# Patient Record
Sex: Female | Born: 1960 | Race: White | Hispanic: No | Marital: Single | Smoking: Current every day smoker
Health system: Southern US, Community
[De-identification: ages and names within clinical notes are randomized; demographics above are authoritative.]

## PROBLEM LIST (undated history)

## (undated) DIAGNOSIS — M549 Dorsalgia, unspecified: Secondary | ICD-10-CM

## (undated) DIAGNOSIS — I10 Essential (primary) hypertension: Secondary | ICD-10-CM

## (undated) DIAGNOSIS — F32A Depression, unspecified: Secondary | ICD-10-CM

## (undated) DIAGNOSIS — L739 Follicular disorder, unspecified: Secondary | ICD-10-CM

## (undated) DIAGNOSIS — C50919 Malignant neoplasm of unspecified site of unspecified female breast: Secondary | ICD-10-CM

## (undated) DIAGNOSIS — K2960 Other gastritis without bleeding: Secondary | ICD-10-CM

## (undated) DIAGNOSIS — Z9221 Personal history of antineoplastic chemotherapy: Secondary | ICD-10-CM

## (undated) DIAGNOSIS — Z923 Personal history of irradiation: Secondary | ICD-10-CM

## (undated) DIAGNOSIS — F419 Anxiety disorder, unspecified: Secondary | ICD-10-CM

## (undated) DIAGNOSIS — Z720 Tobacco use: Secondary | ICD-10-CM

## (undated) DIAGNOSIS — K219 Gastro-esophageal reflux disease without esophagitis: Secondary | ICD-10-CM

## (undated) DIAGNOSIS — D72829 Elevated white blood cell count, unspecified: Secondary | ICD-10-CM

## (undated) DIAGNOSIS — G5600 Carpal tunnel syndrome, unspecified upper limb: Secondary | ICD-10-CM

## (undated) DIAGNOSIS — IMO0002 Reserved for concepts with insufficient information to code with codable children: Secondary | ICD-10-CM

## (undated) DIAGNOSIS — G8929 Other chronic pain: Secondary | ICD-10-CM

## (undated) DIAGNOSIS — I639 Cerebral infarction, unspecified: Secondary | ICD-10-CM

## (undated) DIAGNOSIS — F329 Major depressive disorder, single episode, unspecified: Secondary | ICD-10-CM

## (undated) DIAGNOSIS — K2210 Ulcer of esophagus without bleeding: Secondary | ICD-10-CM

## (undated) DIAGNOSIS — Z972 Presence of dental prosthetic device (complete) (partial): Secondary | ICD-10-CM

## (undated) DIAGNOSIS — E785 Hyperlipidemia, unspecified: Secondary | ICD-10-CM

## (undated) DIAGNOSIS — B009 Herpesviral infection, unspecified: Secondary | ICD-10-CM

## (undated) HISTORY — DX: Depression, unspecified: F32.A

## (undated) HISTORY — DX: Anxiety disorder, unspecified: F41.9

## (undated) HISTORY — DX: Other chronic pain: G89.29

## (undated) HISTORY — DX: Major depressive disorder, single episode, unspecified: F32.9

## (undated) HISTORY — DX: Malignant neoplasm of unspecified site of unspecified female breast: C50.919

## (undated) HISTORY — DX: Other gastritis without bleeding: K29.60

## (undated) HISTORY — PX: UPPER GI ENDOSCOPY: SHX6162

## (undated) HISTORY — DX: Follicular disorder, unspecified: L73.9

## (undated) HISTORY — DX: Gastro-esophageal reflux disease without esophagitis: K21.9

## (undated) HISTORY — DX: Herpesviral infection, unspecified: B00.9

## (undated) HISTORY — DX: Ulcer of esophagus without bleeding: K22.10

## (undated) HISTORY — DX: Cerebral infarction, unspecified: I63.9

## (undated) HISTORY — DX: Tobacco use: Z72.0

## (undated) HISTORY — DX: Essential (primary) hypertension: I10

## (undated) HISTORY — DX: Elevated white blood cell count, unspecified: D72.829

## (undated) HISTORY — DX: Dorsalgia, unspecified: M54.9

## (undated) HISTORY — PX: TUBAL LIGATION: SHX77

## (undated) HISTORY — DX: Hyperlipidemia, unspecified: E78.5

## (undated) HISTORY — PX: OTHER SURGICAL HISTORY: SHX169

## (undated) HISTORY — DX: Carpal tunnel syndrome, unspecified upper limb: G56.00

## (undated) HISTORY — PX: COLONOSCOPY: SHX174

---

## 1998-09-21 ENCOUNTER — Other Ambulatory Visit: Admission: RE | Admit: 1998-09-21 | Discharge: 1998-09-21 | Payer: Self-pay | Admitting: Obstetrics and Gynecology

## 2000-01-30 ENCOUNTER — Other Ambulatory Visit: Admission: RE | Admit: 2000-01-30 | Discharge: 2000-01-30 | Payer: Self-pay | Admitting: Family Medicine

## 2000-02-01 ENCOUNTER — Encounter: Payer: Self-pay | Admitting: Family Medicine

## 2000-02-01 ENCOUNTER — Encounter: Admission: RE | Admit: 2000-02-01 | Discharge: 2000-02-01 | Payer: Self-pay | Admitting: Family Medicine

## 2000-06-26 DIAGNOSIS — G43909 Migraine, unspecified, not intractable, without status migrainosus: Secondary | ICD-10-CM | POA: Insufficient documentation

## 2000-06-26 DIAGNOSIS — G43809 Other migraine, not intractable, without status migrainosus: Secondary | ICD-10-CM | POA: Insufficient documentation

## 2002-10-06 ENCOUNTER — Encounter: Admission: RE | Admit: 2002-10-06 | Discharge: 2002-10-06 | Payer: Self-pay | Admitting: Family Medicine

## 2002-10-06 ENCOUNTER — Encounter: Payer: Self-pay | Admitting: Family Medicine

## 2002-10-25 ENCOUNTER — Encounter: Payer: Self-pay | Admitting: Family Medicine

## 2002-10-25 LAB — CONVERTED CEMR LAB: Pap Smear: NORMAL

## 2002-11-16 ENCOUNTER — Other Ambulatory Visit: Admission: RE | Admit: 2002-11-16 | Discharge: 2002-11-16 | Payer: Self-pay | Admitting: Family Medicine

## 2003-08-27 DIAGNOSIS — B009 Herpesviral infection, unspecified: Secondary | ICD-10-CM | POA: Insufficient documentation

## 2004-05-26 ENCOUNTER — Encounter: Payer: Self-pay | Admitting: Anesthesiology

## 2004-06-14 ENCOUNTER — Ambulatory Visit: Payer: Self-pay | Admitting: Anesthesiology

## 2004-07-16 ENCOUNTER — Ambulatory Visit: Payer: Self-pay | Admitting: Anesthesiology

## 2004-07-25 ENCOUNTER — Ambulatory Visit: Payer: Self-pay | Admitting: Family Medicine

## 2004-07-31 ENCOUNTER — Ambulatory Visit: Payer: Self-pay | Admitting: Professional

## 2004-08-07 ENCOUNTER — Ambulatory Visit: Payer: Self-pay | Admitting: Professional

## 2004-08-14 ENCOUNTER — Ambulatory Visit: Payer: Self-pay | Admitting: Professional

## 2004-08-15 ENCOUNTER — Ambulatory Visit: Payer: Self-pay | Admitting: Family Medicine

## 2004-08-28 ENCOUNTER — Ambulatory Visit: Payer: Self-pay | Admitting: Professional

## 2004-09-04 ENCOUNTER — Ambulatory Visit: Payer: Self-pay | Admitting: Professional

## 2004-09-06 ENCOUNTER — Ambulatory Visit: Payer: Self-pay | Admitting: Anesthesiology

## 2004-09-07 ENCOUNTER — Ambulatory Visit: Payer: Self-pay | Admitting: Family Medicine

## 2004-09-11 ENCOUNTER — Ambulatory Visit: Payer: Self-pay | Admitting: Family Medicine

## 2004-09-19 ENCOUNTER — Encounter: Payer: Self-pay | Admitting: Anesthesiology

## 2004-09-26 ENCOUNTER — Encounter: Payer: Self-pay | Admitting: Anesthesiology

## 2004-11-27 ENCOUNTER — Ambulatory Visit: Payer: Self-pay | Admitting: Family Medicine

## 2004-12-05 ENCOUNTER — Ambulatory Visit: Payer: Self-pay | Admitting: Anesthesiology

## 2004-12-12 ENCOUNTER — Ambulatory Visit: Payer: Self-pay | Admitting: Family Medicine

## 2004-12-17 ENCOUNTER — Ambulatory Visit: Payer: Self-pay | Admitting: Anesthesiology

## 2005-03-28 ENCOUNTER — Ambulatory Visit: Payer: Self-pay | Admitting: Family Medicine

## 2006-08-26 HISTORY — PX: OTHER SURGICAL HISTORY: SHX169

## 2006-09-16 ENCOUNTER — Ambulatory Visit: Payer: Self-pay | Admitting: Family Medicine

## 2006-09-16 LAB — CONVERTED CEMR LAB
ALT: 15 units/L (ref 0–40)
AST: 18 units/L (ref 0–37)
Albumin: 3.3 g/dL — ABNORMAL LOW (ref 3.5–5.2)
BUN: 9 mg/dL (ref 6–23)
Basophils Absolute: 0 10*3/uL (ref 0.0–0.1)
Basophils Relative: 0.4 % (ref 0.0–1.0)
CO2: 31 meq/L (ref 19–32)
Calcium: 10.2 mg/dL (ref 8.4–10.5)
Chloride: 102 meq/L (ref 96–112)
Cholesterol: 183 mg/dL (ref 0–200)
Creatinine, Ser: 1.2 mg/dL (ref 0.4–1.2)
Eosinophils Relative: 0.8 % (ref 0.0–5.0)
GFR calc Af Amer: 62 mL/min
GFR calc non Af Amer: 52 mL/min
Glucose, Bld: 87 mg/dL (ref 70–99)
HCT: 40.8 % (ref 36.0–46.0)
HDL: 45.7 mg/dL (ref >39.0)
Hemoglobin: 14.2 g/dL (ref 12.0–15.0)
LDL Cholesterol: 115 mg/dL — ABNORMAL HIGH (ref 0–99)
Lymphocytes Relative: 20 % (ref 12.0–46.0)
MCHC: 34.8 g/dL (ref 30.0–36.0)
MCV: 95.9 fL (ref 78.0–100.0)
Monocytes Absolute: 1.5 10*3/uL — ABNORMAL HIGH (ref 0.2–0.7)
Monocytes Relative: 12.1 % — ABNORMAL HIGH (ref 3.0–11.0)
Neutro Abs: 8.2 10*3/uL — ABNORMAL HIGH (ref 1.4–7.7)
Neutrophils Relative %: 66.7 % (ref 43.0–77.0)
Phosphorus: 4.9 mg/dL — ABNORMAL HIGH (ref 2.3–4.6)
Platelets: 273 10*3/uL (ref 150–400)
Potassium: 3.7 meq/L (ref 3.5–5.1)
RBC: 4.25 M/uL (ref 3.87–5.11)
RDW: 12.8 % (ref 11.5–14.6)
Sodium: 139 meq/L (ref 135–145)
TSH: 1.18 microintl units/mL (ref 0.35–5.50)
Total CHOL/HDL Ratio: 4
Triglycerides: 111 mg/dL (ref 0–149)
VLDL: 22 mg/dL (ref 0–40)
WBC: 12.2 10*3/uL — ABNORMAL HIGH (ref 4.5–10.5)

## 2006-09-30 ENCOUNTER — Ambulatory Visit: Payer: Self-pay | Admitting: Family Medicine

## 2006-10-20 ENCOUNTER — Ambulatory Visit: Payer: Self-pay | Admitting: Anesthesiology

## 2007-01-07 ENCOUNTER — Ambulatory Visit: Payer: Self-pay | Admitting: Anesthesiology

## 2007-02-17 ENCOUNTER — Ambulatory Visit: Payer: Self-pay | Admitting: Anesthesiology

## 2007-09-23 ENCOUNTER — Encounter: Payer: Self-pay | Admitting: Family Medicine

## 2007-09-23 DIAGNOSIS — H409 Unspecified glaucoma: Secondary | ICD-10-CM | POA: Insufficient documentation

## 2007-09-23 DIAGNOSIS — M549 Dorsalgia, unspecified: Secondary | ICD-10-CM

## 2007-09-23 DIAGNOSIS — M4800 Spinal stenosis, site unspecified: Secondary | ICD-10-CM | POA: Insufficient documentation

## 2007-09-23 DIAGNOSIS — G8929 Other chronic pain: Secondary | ICD-10-CM | POA: Insufficient documentation

## 2007-09-23 DIAGNOSIS — G5600 Carpal tunnel syndrome, unspecified upper limb: Secondary | ICD-10-CM | POA: Insufficient documentation

## 2007-11-17 ENCOUNTER — Ambulatory Visit: Payer: Self-pay | Admitting: Family Medicine

## 2007-11-17 DIAGNOSIS — F17200 Nicotine dependence, unspecified, uncomplicated: Secondary | ICD-10-CM | POA: Insufficient documentation

## 2007-11-17 DIAGNOSIS — F341 Dysthymic disorder: Secondary | ICD-10-CM | POA: Insufficient documentation

## 2007-11-17 DIAGNOSIS — E739 Lactose intolerance, unspecified: Secondary | ICD-10-CM | POA: Insufficient documentation

## 2007-12-19 ENCOUNTER — Emergency Department (HOSPITAL_COMMUNITY): Admission: EM | Admit: 2007-12-19 | Discharge: 2007-12-19 | Payer: Self-pay | Admitting: Emergency Medicine

## 2007-12-31 ENCOUNTER — Ambulatory Visit: Payer: Self-pay | Admitting: Family Medicine

## 2008-01-14 ENCOUNTER — Encounter: Payer: Self-pay | Admitting: Family Medicine

## 2008-03-30 ENCOUNTER — Ambulatory Visit: Payer: Self-pay | Admitting: Family Medicine

## 2008-03-30 ENCOUNTER — Encounter: Payer: Self-pay | Admitting: Family Medicine

## 2008-03-30 ENCOUNTER — Other Ambulatory Visit: Admission: RE | Admit: 2008-03-30 | Discharge: 2008-03-30 | Payer: Self-pay | Admitting: Family Medicine

## 2008-03-30 DIAGNOSIS — I10 Essential (primary) hypertension: Secondary | ICD-10-CM | POA: Insufficient documentation

## 2008-04-05 ENCOUNTER — Ambulatory Visit: Payer: Self-pay | Admitting: Family Medicine

## 2008-04-05 DIAGNOSIS — E7800 Pure hypercholesterolemia, unspecified: Secondary | ICD-10-CM | POA: Insufficient documentation

## 2008-04-06 LAB — CONVERTED CEMR LAB
AST: 13 units/L (ref 0–37)
Albumin: 3.4 g/dL — ABNORMAL LOW (ref 3.5–5.2)
Alkaline Phosphatase: 47 units/L (ref 39–117)
BUN: 13 mg/dL (ref 6–23)
Basophils Absolute: 0 10*3/uL (ref 0.0–0.1)
Chloride: 107 meq/L (ref 96–112)
Cholesterol: 180 mg/dL (ref 0–200)
Eosinophils Absolute: 0.2 10*3/uL (ref 0.0–0.7)
GFR calc Af Amer: 115 mL/min
GFR calc non Af Amer: 95 mL/min
HCT: 41.5 % (ref 36.0–46.0)
HDL: 40.1 mg/dL (ref >39.0)
MCHC: 34.4 g/dL (ref 30.0–36.0)
MCV: 98.7 fL (ref 78.0–100.0)
Monocytes Absolute: 1 10*3/uL (ref 0.1–1.0)
Neutrophils Relative %: 65.7 % (ref 43.0–77.0)
Platelets: 256 10*3/uL (ref 150–400)
Potassium: 3.8 meq/L (ref 3.5–5.1)
RDW: 12.9 % (ref 11.5–14.6)
TSH: 1.68 microintl units/mL (ref 0.35–5.50)
Total Bilirubin: 0.8 mg/dL (ref 0.3–1.2)
Triglycerides: 96 mg/dL (ref 0–149)
VLDL: 19 mg/dL (ref 0–40)
WBC: 11.7 10*3/uL — ABNORMAL HIGH (ref 4.5–10.5)

## 2008-05-23 ENCOUNTER — Ambulatory Visit: Payer: Self-pay | Admitting: Family Medicine

## 2008-06-28 ENCOUNTER — Telehealth: Payer: Self-pay | Admitting: Family Medicine

## 2008-06-28 DIAGNOSIS — N926 Irregular menstruation, unspecified: Secondary | ICD-10-CM | POA: Insufficient documentation

## 2009-10-31 ENCOUNTER — Ambulatory Visit: Payer: Self-pay | Admitting: Family Medicine

## 2009-12-01 ENCOUNTER — Ambulatory Visit: Payer: Self-pay | Admitting: Family Medicine

## 2009-12-01 DIAGNOSIS — N760 Acute vaginitis: Secondary | ICD-10-CM | POA: Insufficient documentation

## 2009-12-01 LAB — CONVERTED CEMR LAB: Whiff Test: POSITIVE

## 2009-12-04 LAB — CONVERTED CEMR LAB: Chlamydia, DNA Probe: NEGATIVE

## 2010-01-26 ENCOUNTER — Ambulatory Visit: Payer: Self-pay | Admitting: Family Medicine

## 2010-01-26 ENCOUNTER — Other Ambulatory Visit: Admission: RE | Admit: 2010-01-26 | Discharge: 2010-01-26 | Payer: Self-pay | Admitting: Family Medicine

## 2010-01-29 LAB — CONVERTED CEMR LAB
ALT: 16 units/L (ref 0–35)
AST: 14 units/L (ref 0–37)
Albumin: 3.8 g/dL (ref 3.5–5.2)
Alkaline Phosphatase: 50 units/L (ref 39–117)
BUN: 10 mg/dL (ref 6–23)
Basophils Absolute: 0.1 10*3/uL (ref 0.0–0.1)
Basophils Relative: 0.5 % (ref 0.0–3.0)
Bilirubin, Direct: 0.1 mg/dL (ref 0.0–0.3)
CO2: 30 meq/L (ref 19–32)
Calcium: 9 mg/dL (ref 8.4–10.5)
Chloride: 105 meq/L (ref 96–112)
Cholesterol: 203 mg/dL — ABNORMAL HIGH (ref 0–200)
Creatinine, Ser: 0.6 mg/dL (ref 0.4–1.2)
Direct LDL: 130.3 mg/dL
Eosinophils Absolute: 0.1 10*3/uL (ref 0.0–0.7)
Eosinophils Relative: 1 % (ref 0.0–5.0)
GFR calc non Af Amer: 106.61 mL/min (ref >60)
Glucose, Bld: 75 mg/dL (ref 70–99)
HCT: 40.4 % (ref 36.0–46.0)
HDL: 51.6 mg/dL (ref >39.00)
Hemoglobin: 14 g/dL (ref 12.0–15.0)
Lymphocytes Relative: 25.1 % (ref 12.0–46.0)
Lymphs Abs: 2.9 10*3/uL (ref 0.7–4.0)
MCHC: 34.6 g/dL (ref 30.0–36.0)
MCV: 96.2 fL (ref 78.0–100.0)
Monocytes Absolute: 1.1 10*3/uL — ABNORMAL HIGH (ref 0.1–1.0)
Monocytes Relative: 9.2 % (ref 3.0–12.0)
Neutro Abs: 7.4 10*3/uL (ref 1.4–7.7)
Neutrophils Relative %: 64.2 % (ref 43.0–77.0)
Platelets: 238 10*3/uL (ref 150.0–400.0)
Potassium: 4.2 meq/L (ref 3.5–5.1)
RBC: 4.2 M/uL (ref 3.87–5.11)
RDW: 13.4 % (ref 11.5–14.6)
Sodium: 142 meq/L (ref 135–145)
TSH: 1.23 microintl units/mL (ref 0.35–5.50)
Total Bilirubin: 0.5 mg/dL (ref 0.3–1.2)
Total CHOL/HDL Ratio: 4
Total Protein: 6.1 g/dL (ref 6.0–8.3)
Triglycerides: 97 mg/dL (ref 0.0–149.0)
VLDL: 19.4 mg/dL (ref 0.0–40.0)
WBC: 11.5 10*3/uL — ABNORMAL HIGH (ref 4.5–10.5)

## 2010-02-05 ENCOUNTER — Encounter: Payer: Self-pay | Admitting: Family Medicine

## 2010-02-23 ENCOUNTER — Encounter: Payer: Self-pay | Admitting: Family Medicine

## 2010-03-06 ENCOUNTER — Encounter: Payer: Self-pay | Admitting: Family Medicine

## 2010-03-21 ENCOUNTER — Encounter (INDEPENDENT_AMBULATORY_CARE_PROVIDER_SITE_OTHER): Payer: Self-pay | Admitting: *Deleted

## 2010-05-29 ENCOUNTER — Ambulatory Visit: Payer: Self-pay

## 2010-05-29 ENCOUNTER — Ambulatory Visit: Payer: Self-pay | Admitting: Family Medicine

## 2010-05-29 DIAGNOSIS — R609 Edema, unspecified: Secondary | ICD-10-CM | POA: Insufficient documentation

## 2010-05-29 DIAGNOSIS — M79609 Pain in unspecified limb: Secondary | ICD-10-CM | POA: Insufficient documentation

## 2010-06-04 ENCOUNTER — Encounter: Payer: Self-pay | Admitting: Family Medicine

## 2010-09-27 NOTE — Letter (Signed)
Summary: Results Follow up Letter  [place] at [facility]  [address] [place], [place] [address]   Phone: [phone]  Fax: [phone]    03/21/2010 MRN: [id number]  Amy Leonard [address] [place], [place]  [address]  Dear [name],  The following are the results of your recent test(s):  Test         Result    Pap Smear:        Normal _____  Not Normal _____ Comments: ______________________________________________________ Cholesterol: LDL(Bad cholesterol):         Your goal is less than:         HDL (Good cholesterol):       Your goal is more than: Comments:  ______________________________________________________ Mammogram:        Normal _x____  Not Normal _____ Comments:  Next mammogram due in one year  _________________________________________________________________ Hemoccult:        Normal _____  Not normal _______ Comments:    _____________________________________________________________________ Other Tests:    We routinely do not discuss normal results over the telephone.  If you desire a copy of the results, or you have any questions about this information we can discuss them at your next office visit.   Sincerely,

## 2010-09-27 NOTE — Letter (Signed)
Summary: [facility]-Consultation Confirmation  [facility]-Consultation Confirmation   Imported By: Maryln Gottron 06/13/2010 15:51:44  _____________________________________________________________________  External Attachment:    Type:   Image     Comment:   External Document

## 2010-09-27 NOTE — Assessment & Plan Note (Signed)
Summary: CHECK STD/CLE   Vital Signs:  Patient profile:   50 year old female Height:      64 inches Weight:      178.25 pounds BMI:     30.71 Temp:     98.2 degrees F oral Pulse rate:   92 / minute Pulse rhythm:   regular BP sitting:   148 / 88  (left arm) Cuff size:   large  Vitals Entered By: Lewanda Rife LPN (December 01, 6293 10:15 AM) CC: check for STD and rash on buttock   History of Present Illness: is back seeing her old boyfriend   he got checked for HIV and was neg  she generally wants to get checked for stds  thinks she may have yeast -- little burning and itching on the outside -- just started yesterday no discharge  no fever or other symptoms   with her period -- has odor for 2-3 d after -- that is nl for her  period is irregular  does have hot flashes also   needs to sched annual exam with pap when able   Allergies: 1)  ! Lipitor 2)  ! Wellbutrin 3)  ! * Aleve 4)  ! * Tussionex  Past History:  Past Medical History: Last updated: 05/23/2008 elevated wbc, nl diff chronic back pain-- spinal stenosis tabacco abuse depression/anx HSV- recurrent (side/buttock) migraine  ? glaucoma carpal tunnel  hyperlipidemia    ortho -- Dr Sherlean Foot  Past Surgical History: Last updated: 09/23/2007 BTL epidural steroid injection /08  Family History: Last updated: 03/30/2008 mother DM and HTN, leukemia in remission , obese GM strokes, DM sister depression  2 sisters and 1 brother with ETOH abuse , and drug abuse  son with bipolar/oppositional defiant   Social History: Last updated: 05/23/2008 Current Smoker 11/2 ppd  alcohol -- 4-5 beers fri and sat night, usually none during the week  drinking changes based on mood and anger level  no regular exercise  full time and part time job  is separated from husband   Risk Factors: Smoking Status: current (12/31/2007)  Review of Systems General:  Denies fatigue, fever, loss of appetite, and malaise. Eyes:   Denies discharge. CV:  Denies chest pain or discomfort and palpitations. Resp:  Denies cough. GI:  Denies diarrhea, nausea, and vomiting. GU:  Denies discharge, dysuria, hematuria, and urinary frequency. MS:  Denies joint pain. Derm:  Complains of itching; denies rash. Neuro:  Denies headaches. Psych:  mood is ok . Endo:  Denies excessive thirst and excessive urination.  Physical Exam  General:  Well-developed,well-nourished,in no acute distress; alert,appropriate and cooperative throughout examination Eyes:  vision grossly intact, pupils equal, pupils round, pupils reactive to light, and no injection.   Mouth:  pharynx pink and moist.   Neck:  No deformities, masses, or tenderness noted. Lungs:  diffusely distant bs CTA  Heart:  Normal rate and regular rhythm. S1 and S2 normal without gallop, murmur, click, rub or other extra sounds. Abdomen:  no suprapubic tenderness or fullness felt  Genitalia:  Normal introitus for age, no external lesions, no vaginal discharge, mucosa pink and moist, no vaginal or cervical lesions, no vaginal atrophy, no friaility or hemorrhage, normal uterus size and position, no adnexal masses or tenderness Msk:  no acute joint changes  Extremities:  No clubbing, cyanosis, edema, or deformity noted with normal full range of motion of all joints.   Neurologic:  gait normal and DTRs symmetrical and normal.  no tremor  Skin:  Intact without suspicious lesions or rashes Cervical Nodes:  No lymphadenopathy noted Inguinal Nodes:  No significant adenopathy Psych:  normal affect, talkative and pleasant    Impression & Recommendations:  Problem # 1:  SEXUALLY TRANSMITTED DISEASE, EXPOSURE TO (ICD-V01.6) Assessment Unchanged gon and chlam/ HIV and rpr done to day to update  disc safe sexual practices   Orders: T-Chlamydia Probe, genital [phone]) T-GC Probe, genital [phone]) Venipuncture ([address]) T-HIV Antibody  (Reflex) [phone]) T-RPR (Syphilis)  ([id number]) Specimen Handling ([address]) Wet Prep (42595GL) Prescription Created Electronically ([phone])  Problem # 2:  VAGINITIS, BACTERIAL (ICD-616.10) Assessment: New  with some irritative symptoms  tx with metrogel vaginal and update  Her updated medication list for this problem includes:    Metrogel-vaginal 0.75 % Gel (Metronidazole) .Marland Kitchen... 1 applicator intravaginally at bedtime for 7 days  Orders: Wet Prep ([id number]) Prescription Created Electronically [phone])  Complete Medication List: 1)  Tramadol Hcl 50 Mg Tabs (Tramadol hcl) .Marland Kitchen.. 1 tab by mouth every 6 hours as needed breaktrhu pain. 2)  Ibuprofen 800 Mg Tabs (Ibuprofen) .... One tab by mouth three times a day after meals. 3)  Flexeril 10 Mg Tabs (Cyclobenzaprine hcl) .... One tab by mouth three times a day as needed 4)  Zovirax 5 % Oint (Acyclovir) .... Apply to affected area 5 times per day as needed breakout 5)  Metrogel-vaginal 0.75 % Gel (Metronidazole) .Marland Kitchen.. 1 applicator intravaginally at bedtime for 7 days  Patient Instructions: 1)  please schedule PE with pap this summer  2)  use the metrogel vaginal gel as directed for bacterial vaginosis  3)  use the zovirax ointment for breakout on buttock when needed  4)  I will update you with labs when they return  5)  use condoms every time  Prescriptions: METROGEL-VAGINAL 0.75 % GEL (METRONIDAZOLE) 1 applicator intravaginally at bedtime for 7 days  #1 course x 0   Entered and Authorized by:   Judith Part MD   Signed by:   Judith Part MD on 12/01/2009   Method used:   Electronically to        [facility]* (retail)       [address]       [place], [place]  [address]       Ph: [phone]       Fax: [phone]   RxID:   [id number] ZOVIRAX 5 % OINT (ACYCLOVIR) apply to affected area 5 times per day as needed breakout  #1 medium x 3   Entered and Authorized by:   Judith Part MD   Signed by:   Judith Part MD on 12/01/2009   Method used:    Electronically to        [facility]* (retail)       [address]       [place], [place]  [address]       Ph: [phone]       Fax: [phone]   RxID:   [id number]   Current Allergies (reviewed today): ! LIPITOR ! WELLBUTRIN ! * ALEVE ! * TUSSIONEX    Laboratory Results    Wet [place]/KOH Source: vaginal  WBC/hpf 1-5 Bacteria/hpf 2+  Rods Clue cells/hpf moderate  Positive whiff Yeast/hpf none KOH Negative Trichomonas/hpf none

## 2010-09-27 NOTE — Letter (Signed)
Summary: Out of Work  [facility]  [address] [place], [place] [address]   Phone: [phone]  Fax: [phone]    October 31, 2009   Employee:  Amy Leonard    To Whom It May Concern:   For Medical reasons, please excuse the above named employee from work for the following dates:  Start:   10/31/09   End:   11/04/09 but please limit lifting to one gallon at a time for another week.  If you need additional information, please feel free to contact our office.         Sincerely,    Shaune Leeks MD

## 2010-09-27 NOTE — Assessment & Plan Note (Signed)
Summary: cpx w pap/dlo   Vital Signs:  Patient profile:   50 year old female Height:      63.5 inches Weight:      179.50 pounds BMI:     31.41 Temp:     97.8 degrees F oral Pulse rate:   80 / minute Pulse rhythm:   regular BP sitting:   130 / 74  (left arm) Cuff size:   large  Vitals Entered By: Lewanda Rife LPN (January 27, 8656 10:52 AM) CC: CPX with pap and brerast exam LMP 12/31/09   History of Present Illness: here for health mt exam and pap and breast exam   is working a lot with no help - does the work of 3 people  otherwise doing ok  wt is stable with bmi of 31  bp is 130/74   tab - no change in smoking  thinks she is ready to quit  would like to try chantix  has not tried nicotine replacement  thinks ins may pay  no breathing problems that are new   is hard to exercise with her back pain that is chronic    overdue lipid check  did eat today  diet is usually pretty fair -- did eat bisciut today , sandwhiches / frozen dinner - not the best  intol lipitor in past   hx of hsv recent std tests neg menses -- still monthly , last even longer now -up to 7-8 days to 11/2 weeks  ? perimenopausal  probs for last few years -- occ heavy bleeding or frequency - not for a while  pap 04- is due for that   is occ seeing same partner   mam 04-needs to set that up  self exam- no lumps or changes -- is fibrocystic   Td 04      Allergies: 1)  ! Lipitor 2)  ! Wellbutrin 3)  ! * Aleve 4)  ! * Tussionex  Past History:  Past Medical History: Last updated: 05/23/2008 elevated wbc, nl diff chronic back pain-- spinal stenosis tabacco abuse depression/anx HSV- recurrent (side/buttock) migraine  ? glaucoma carpal tunnel  hyperlipidemia    ortho -- Dr Sherlean Foot  Past Surgical History: Last updated: 09/23/2007 BTL epidural steroid injection /08  Family History: Last updated: 03/30/2008 mother DM and HTN, leukemia in remission , obese GM strokes, DM sister  depression  2 sisters and 1 brother with ETOH abuse , and drug abuse  son with bipolar/oppositional defiant   Social History: Last updated: 05/23/2008 Current Smoker 11/2 ppd  alcohol -- 4-5 beers fri and sat night, usually none during the week  drinking changes based on mood and anger level  no regular exercise  full time and part time job  is separated from husband   Risk Factors: Smoking Status: current (12/31/2007)  Review of Systems General:  Denies chills, fatigue, fever, loss of appetite, and malaise. Eyes:  Denies blurring and eye irritation. ENT:  Denies sinus pressure and sore throat. CV:  Denies chest pain or discomfort, palpitations, shortness of breath with exertion, and swelling of feet. Resp:  Denies cough, sputum productive, and wheezing. GI:  Denies abdominal pain, change in bowel habits, indigestion, nausea, and vomiting. GU:  Denies discharge, dysuria, and hematuria. MS:  Complains of low back pain; denies joint pain, joint redness, and joint swelling. Derm:  Denies itching, lesion(s), poor wound healing, and rash. Neuro:  Denies numbness and tingling. Psych:  mood is about the same .  Endo:  Denies cold intolerance, excessive thirst, excessive urination, and heat intolerance. Heme:  Denies abnormal bruising and bleeding.  Physical Exam  General:  overweight but generally well appearing  Head:  normocephalic, atraumatic, and no abnormalities observed.   Eyes:  vision grossly intact, pupils equal, pupils round, and pupils reactive to light.  no conjunctival pallor, injection or icterus  Ears:  R ear normal and L ear normal.   Nose:  no nasal discharge.   Mouth:  pharynx pink and moist.   Neck:  supple with full rom and no masses or thyromegally, no JVD or carotid bruit  Chest Wall:  No deformities, masses, or tenderness noted. Breasts:  No mass, nodules, thickening, tenderness, bulging, retraction, inflamation, nipple discharge or skin changes noted.     Lungs:  diffusely distant bs CTA  Heart:  Normal rate and regular rhythm. S1 and S2 normal without gallop, murmur, click, rub or other extra sounds. Abdomen:  Bowel sounds positive,abdomen soft and non-tender without masses, organomegaly or hernias noted. no renal bruits  Genitalia:  Normal introitus for age, no external lesions, no vaginal discharge, mucosa pink and moist, no vaginal or cervical lesions, no vaginal atrophy, no friaility or hemorrhage, normal uterus size and position, no adnexal masses or tenderness Msk:  No deformity or scoliosis noted of thoracic or lumbar spine.  poor rom LS  no acute joint changes Pulses:  R and L carotid,radial,femoral,dorsalis pedis and posterior tibial pulses are full and equal bilaterally Extremities:  No clubbing, cyanosis, edema, or deformity noted with normal full range of motion of all joints.   Neurologic:  gait normal and DTRs symmetrical and normal.  no tremor  Skin:  tanned with lentigos diffusely Cervical Nodes:  No lymphadenopathy noted Axillary Nodes:  No palpable lymphadenopathy Inguinal Nodes:  No significant adenopathy Psych:  normal affect, talkative and pleasant    Impression & Recommendations:  Problem # 1:  HEALTH MAINTENANCE EXAM (ICD-V70.0)  reviewed health habits including diet, exercise and skin cancer prevention reviewed health maintenance list and family history  labs today  Orders: Venipuncture ([address]) TLB-Lipid Panel (80061-LIPID) TLB-BMP (Basic Metabolic Panel-BMET) (80048-METABOL) TLB-CBC Platelet - w/Differential (85025-CBCD) TLB-Hepatic/Liver Function Pnl (80076-HEPATIC) TLB-TSH (Thyroid Stimulating Hormone) (84443-TSH)  Problem # 2:  PURE HYPERCHOLESTEROLEMIA (ICD-272.0)  check lipid today diet sub optimal non tol lipitor in past disc imp of chol control to dec CV risks as a smoker   Orders: Venipuncture ([address]) TLB-Lipid Panel (80061-LIPID) TLB-BMP (Basic Metabolic Panel-BMET)  (80048-METABOL) TLB-CBC Platelet - w/Differential (85025-CBCD) TLB-Hepatic/Liver Function Pnl (80076-HEPATIC) TLB-TSH (Thyroid Stimulating Hormone) (84443-TSH)  Problem # 3:  ROUTINE GYNECOLOGICAL EXAMINATION (ICD-V72.31) Assessment: Comment Only  annual exam with pap  recent neg std tests  perimenopausal period changes- is tolerating   Orders: Venipuncture ([address]) TLB-Lipid Panel (80061-LIPID) TLB-BMP (Basic Metabolic Panel-BMET) (80048-METABOL) TLB-CBC Platelet - w/Differential (85025-CBCD) TLB-Hepatic/Liver Function Pnl (80076-HEPATIC) TLB-TSH (Thyroid Stimulating Hormone) (84443-TSH)  Problem # 4:  TOBACCO USE (ICD-305.1) Assessment: Unchanged  pt is interested in trying to quit with chantix  px written if side eff like depression- stop and call disc vivid dreams/ nausea and other potential side eff given handout  Her updated medication list for this problem includes:    Chantix Starting Month Pak 0.5 Mg X 11 & 1 Mg X 42 Tabs (Varenicline tartrate) .Marland Kitchen... Take by mouth as directed    Chantix 1 Mg Tabs (Varenicline tartrate) .Marland Kitchen... 1 by mouth two times a day  to start after starter pack  Orders: Venipuncture ([address]) TLB-Lipid  Panel (80061-LIPID) TLB-BMP (Basic Metabolic Panel-BMET) (80048-METABOL) TLB-CBC Platelet - w/Differential (85025-CBCD) TLB-Hepatic/Liver Function Pnl (80076-HEPATIC) TLB-TSH (Thyroid Stimulating Hormone) (84443-TSH)  Complete Medication List: 1)  Tramadol Hcl 50 Mg Tabs (Tramadol hcl) .Marland Kitchen.. 1 tab by mouth every 6 hours as needed breaktrhu pain. 2)  Ibuprofen 800 Mg Tabs (Ibuprofen) .... One tab by mouth three times a day after meals as needed. 3)  Flexeril 10 Mg Tabs (Cyclobenzaprine hcl) .... One tab by mouth three times a day as needed 4)  Zovirax 5 % Oint (Acyclovir) .... Apply to affected area 5 times per day as needed breakout 5)  Tums 500 Mg Chew (Calcium carbonate antacid) .... Otc as directed. 6)  Chantix Starting Month Pak 0.5 Mg X 11 & 1  Mg X 42 Tabs (Varenicline tartrate) .... Take by mouth as directed 7)  Chantix 1 Mg Tabs (Varenicline tartrate) .Marland Kitchen.. 1 by mouth two times a day  to start after starter pack  Other Orders: Radiology Referral (Radiology)  Patient Instructions: 1)  you can raise your HDL (good cholesterol) by increasing exercise and eating omega 3 fatty acid supplement like fish oil or flax seed oil over the counter 2)  you can lower LDL (bad cholesterol) by limiting saturated fats in diet like red meat, fried foods, egg yolks, fatty breakfast meats, high fat dairy products and shellfish  3)  we will schedule mammogram at check out  4)  the current recommendation for calcium intake is 1200-1500 mg daily with 1000 IU of vitamin D  5)  try chantix to quit smoking  6)  labs today Prescriptions: TRAMADOL HCL 50 MG  TABS (TRAMADOL HCL) 1 tab by mouth every 6 hours as needed breaktrhu pain.  #90 x 1   Entered and Authorized by:   Judith Part MD   Signed by:   Judith Part MD on 01/26/2010   Method used:   Print then Give to Patient   RxID:   [id number] CHANTIX 1 MG TABS (VARENICLINE TARTRATE) 1 by mouth two times a day  to start after starter pack  #60 x 4   Entered and Authorized by:   Judith Part MD   Signed by:   Judith Part MD on 01/26/2010   Method used:   Print then Give to Patient   RxID:   [id number] CHANTIX STARTING MONTH PAK 0.5 MG X 11 & 1 MG X 42 TABS (VARENICLINE TARTRATE) take by mouth as directed  #1 pack x 0   Entered and Authorized by:   Judith Part MD   Signed by:   Judith Part MD on 01/26/2010   Method used:   Print then Give to Patient   RxID:   [id number]   Current Allergies (reviewed today): ! LIPITOR ! WELLBUTRIN ! * ALEVE ! * TUSSIONEX

## 2010-09-27 NOTE — Consult Note (Signed)
Summary: [facility]  [facility]   Imported By: Lanelle Bal 06/18/2010 15:36:00  _____________________________________________________________________  External Attachment:    Type:   Image     Comment:   External Document

## 2010-09-27 NOTE — Assessment & Plan Note (Signed)
Summary: leg is hurting/alc   Vital Signs:  Patient profile:   50 year old female Height:      63.5 inches Weight:      179 pounds BMI:     31.32 Temp:     98.2 degrees F oral Pulse rate:   80 / minute Pulse rhythm:   regular BP sitting:   146 / 84  (left arm) Cuff size:   large  Vitals Entered By: Lewanda Rife LPN (May 29, 2010 12:37 PM) CC: Rt leg is hurting on and off for two weeks. Pain level now is 4.   History of Present Illness: r leg has been swollen for about a week now thinks it may be from knee twisted knee about 2 weeks ago -- got bad --for several days -- after turning at work / no fall  then calf cramp for 4 days  back always bothers her too   no car trips or prolonged immobility   knee feels tender and bruised and medial calf is painful  some bruising there too  also varicose veins   Allergies: 1)  ! Lipitor 2)  ! Wellbutrin 3)  ! * Aleve 4)  ! * Tussionex  Past History:  Past Medical History: Last updated: 05/23/2008 elevated wbc, nl diff chronic back pain-- spinal stenosis tabacco abuse depression/anx HSV- recurrent (side/buttock) migraine  ? glaucoma carpal tunnel  hyperlipidemia    ortho -- Dr Sherlean Foot  Past Surgical History: Last updated: 09/23/2007 BTL epidural steroid injection /08  Family History: Last updated: 03/30/2008 mother DM and HTN, leukemia in remission , obese GM strokes, DM sister depression  2 sisters and 1 brother with ETOH abuse , and drug abuse  son with bipolar/oppositional defiant   Social History: Last updated: 05/23/2008 Current Smoker 11/2 ppd  alcohol -- 4-5 beers fri and sat night, usually none during the week  drinking changes based on mood and anger level  no regular exercise  full time and part time job  is separated from husband   Risk Factors: Smoking Status: current (12/31/2007)  Review of Systems General:  Denies fatigue, fever, loss of appetite, and malaise. CV:  Denies chest pain or  discomfort and palpitations. Resp:  Denies cough and wheezing. GI:  Denies abdominal pain. MS:  Complains of joint pain, joint swelling, muscle aches, and stiffness; denies joint redness, cramps, and muscle weakness. Derm:  Denies itching, lesion(s), poor wound healing, and rash. Neuro:  Denies numbness.  Physical Exam  General:  overweight but generally well appearing  Head:  normocephalic, atraumatic, and no abnormalities observed.   Eyes:  vision grossly intact, pupils equal, pupils round, and pupils reactive to light.   Mouth:  pharynx pink and moist.   Neck:  supple with full rom and no masses or thyromegally, no JVD or carotid bruit  Lungs:  diffusely distant bs CTA  Heart:  Normal rate and regular rhythm. S1 and S2 normal without gallop, murmur, click, rub or other extra sounds. Msk:  R knee is warm to touch  mild med and lat joint line tenderness poss small eff  some tenderness behind knee lat calf tenderness without palp cord  pos homann's sign for leg pain  mild ankle edema  without pitting  Pulses:  R and L carotid,radial,femoral,dorsalis pedis and posterior tibial pulses are full and equal bilaterally Extremities:  no palpale cords  few small compressible varicose veins  Neurologic:  sensation intact to light touch, gait normal, and DTRs symmetrical  and normal.   Skin:  Intact without suspicious lesions or rashes Cervical Nodes:  No lymphadenopathy noted Inguinal Nodes:  No significant adenopathy Psych:  normal affect, talkative and pleasant    Impression & Recommendations:  Problem # 1:  LEG PAIN, RIGHT (ICD-729.5) Assessment New suspect this is likely knee related but in light of overall leg swelling/ tenderness and pos homann's sign must also r/o dvt  will send for doppler (which would also show baker's cyst if present) then make plan  may need sport med or ortho for knee if all neg  Orders: Doppler Referral (Doppler)  Problem # 2:  EDEMA (ICD-782.3) see  above  Orders: Doppler Referral (Doppler)  Complete Medication List: 1)  Tramadol Hcl 50 Mg Tabs (Tramadol hcl) .Marland Kitchen.. 1 tab by mouth every 6 hours as needed breaktrhu pain. 2)  Ibuprofen 800 Mg Tabs (Ibuprofen) .... One tab by mouth three times a day after meals as needed. 3)  Flexeril 10 Mg Tabs (Cyclobenzaprine hcl) .... One tab by mouth three times a day as needed 4)  Zovirax 5 % Oint (Acyclovir) .... Apply to affected area 5 times per day as needed breakout 5)  Tums 500 Mg Chew (Calcium carbonate antacid) .... Otc as directed. 6)  Chantix Starting Month Pak 0.5 Mg X 11 & 1 Mg X 42 Tabs (Varenicline tartrate) .... Take by mouth as directed 7)  Chantix 1 Mg Tabs (Varenicline tartrate) .Marland Kitchen.. 1 by mouth two times a day  to start after starter pack  Patient Instructions: 1)  we will set up the leg doppler at check out  2)  keep ice on knee when you can and elevate it  3)  ibuprofen if helpful --if it does not bother your stomach  4)  I will make plan when I get results   Current Allergies (reviewed today): ! LIPITOR ! WELLBUTRIN ! * ALEVE ! * TUSSIONEX  Appended Document: leg is hurting/alc please let pt know I got call report that her doppler of leg is normal  no blood clots  I would like to refer her to ortho for knee pain/ injury if agreeable   Appended Document: leg is hurting/alc Patient notified as instructed by telephone. Pt said is agreeable to see orthopedic dr. Pt would like to go after Friday of this week; pt is going to ck with insurance co about cost of copay. Pt will wait to hear from pt care coordinator and can be reached at [phone].  Appended Document: leg is hurting/alc will do ref for [facility]   Clinical Lists Changes  Orders: Added new Referral order of Orthopedic Referral (Ortho) - Signed

## 2010-09-27 NOTE — Miscellaneous (Signed)
Summary: Orders Update  Clinical Lists Changes  Problems: Added new problem of EDEMA LEG (ICD-782.3) Orders: Added new Test order of Venous Duplex Lower Extremity (Venous Duplex Lower) - Signed 

## 2010-09-27 NOTE — Miscellaneous (Signed)
Summary: pap screening  Clinical Lists Changes  Observations: Added new observation of PAP SMEAR: normal (01/26/2010 9:47)      Preventive Care Screening  Pap Smear:    Date:  01/26/2010    Results:  normal

## 2010-09-27 NOTE — Assessment & Plan Note (Signed)
Summary: pain in left side, hurts to walk/ alc   Vital Signs:  Patient profile:   50 year old female Height:      64 inches Weight:      180 pounds BMI:     31.01 Temp:     98.2 degrees F oral Pulse rate:   76 / minute Pulse rhythm:   regular BP sitting:   158 / 90  (left arm) Cuff size:   regular  Vitals Entered By: Sydell Axon LPN (October 31, 1608 11:25 AM) CC: Pain in above the hip bone and it hurts to walk   History of Present Illness: Ms. Whidbee is a 50 y/o caucasian female who presents today with L sided flank pain superior to her iliac crest and painful walking.  She began noticing pain 3 days ago in the evening.  The pain is 5/10 and has kept her from sleeping well at night.  The patient has a history of back pain for which she takes ibuprofen (800mg  1/day) in the evenings.  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 1 1/2 By Mouth At Bedtime 2)  Tramadol Hcl 50 Mg  Tabs (Tramadol Hcl) .Marland Kitchen.. 1 Tab By Mouth Q 6 Hours As Needed Breaktrhu Pain.  Allergies: 1)  ! Lipitor 2)  ! Wellbutrin 3)  ! * Aleve 4)  ! *  Tussionex  Physical Exam  General:  Well-developed,well-nourished,in no acute distress; alert,appropriate and cooperative throughout examination Head:  Normocephalic and atraumatic without obvious abnormalities. No apparent alopecia or balding. Msk:  L sided tenderness to palpation just above iliac crest.  No pain with hip flexion, extension, abduction, or adduction or pain with flexing at the waist.   Impression & Recommendations:  Problem # 1:  LUMBAR SPRAIN AND STRAIN, LEFT (ICD-847.2) Assessment New See instructions.  Complete Medication List: 1)  Tramadol Hcl 50 Mg Tabs (Tramadol hcl) .Marland Kitchen.. 1 tab by mouth every 6 hours as needed breaktrhu pain. 2)  Ibuprofen 800 Mg Tabs (Ibuprofen) .... One tab by mouth three times a day after meals. 3)  Flexeril 10 Mg Tabs (Cyclobenzaprine hcl) .... One tab by mouth three times a day  Patient Instructions: 1)  Ibuprofen (800 mg) three times per day after meals. 2)  Flexeril 3 times per day until Saturday. On Saturday take a whole pill at night and half pills during the day if needed. 3)  Use cold compress today. After today use heat during the day and ice at night. 4)  Avoid sitting and laying down for long periods of time. 5)  Walking can help relax the back. Prescriptions: FLEXERIL  10 MG TABS (CYCLOBENZAPRINE HCL) one tab by mouth three times a day  #50 x 1   Entered and Authorized by:   Shaune Leeks MD   Signed by:   Shaune Leeks MD on 10/31/2009   Method used:   Electronically to        [facility]* (retail)       [address]       [place], [place]  [address]       Ph: [phone]       Fax: [phone]   RxID:   [id number] IBUPROFEN 800 MG TABS (IBUPROFEN) one tab by mouth three times a day after meals.  #90 x 1   Entered and Authorized by:   Shaune Leeks MD   Signed by:   Shaune Leeks MD on 10/31/2009   Method used:   Electronically to        [facility]* (retail)       [address]       [place], [place]  [address]       Ph: [phone]       Fax: [phone]   RxID:   [id number]   Current Allergies (reviewed today): ! LIPITOR ! WELLBUTRIN ! * ALEVE ! * TUSSIONEX  Appended Document: pain in left side, hurts to walk/ alc See Dr Milinda Antis son  ta reassess BP.

## 2010-09-27 NOTE — Letter (Signed)
Summary: Results Follow up Letter  [place] [facility]  [address] [place], [place] [address]   Phone: [phone]  Fax: [phone]    02/05/2010 MRN: [id number]    Amy Leonard [address] [place], [place]  [address]    Dear [name],  The following are the results of your recent test(s):  Test         Result    Pap Smear:        Normal __X__  Not Normal _____ Comments: ______________________________________________________ Cholesterol: LDL(Bad cholesterol):         Your goal is less than:         HDL (Good cholesterol):       Your goal is more than: Comments:  ______________________________________________________ Mammogram:        Normal _____  Not Normal _____ Comments:  ___________________________________________________________________ Hemoccult:        Normal _____  Not normal _______ Comments:    _____________________________________________________________________ Other Tests:    We routinely do not discuss normal results over the telephone.  If you desire a copy of the results, or you have any questions about this information we can discuss them at your next office visit.   Sincerely,    Idamae Schuller Aileene Lanum,MD  MT/ri

## 2010-09-28 NOTE — Miscellaneous (Signed)
Summary: Controlled Substance Agreement  Controlled Substance Agreement   Imported By: Lanelle Bal 02/01/2010 09:07:01  _____________________________________________________________________  External Attachment:    Type:   Image     Comment:   External Document

## 2011-05-04 ENCOUNTER — Encounter: Payer: Self-pay | Admitting: *Deleted

## 2011-05-04 ENCOUNTER — Ambulatory Visit (INDEPENDENT_AMBULATORY_CARE_PROVIDER_SITE_OTHER): Payer: BC Managed Care – PPO | Admitting: Family Medicine

## 2011-05-04 VITALS — BP 150/86 | HR 71 | Temp 98.0°F | Wt 176.0 lb

## 2011-05-04 DIAGNOSIS — L739 Follicular disorder, unspecified: Secondary | ICD-10-CM

## 2011-05-04 DIAGNOSIS — F17200 Nicotine dependence, unspecified, uncomplicated: Secondary | ICD-10-CM

## 2011-05-04 DIAGNOSIS — L738 Other specified follicular disorders: Secondary | ICD-10-CM

## 2011-05-04 DIAGNOSIS — L0291 Cutaneous abscess, unspecified: Secondary | ICD-10-CM

## 2011-05-04 DIAGNOSIS — L0390 Cellulitis, unspecified: Secondary | ICD-10-CM

## 2011-05-04 MED ORDER — SULFAMETHOXAZOLE-TRIMETHOPRIM 800-160 MG PO TABS: 1.0000 | ORAL_TABLET | Freq: Two times a day (BID) | ORAL | Status: AC

## 2011-05-04 NOTE — Patient Instructions (Signed)
Cellulitis Cellulitis is an infection of the skin and the tissue beneath it. The area is typically red and tender. It is caused by germs (bacteria) (usually staph or strep) that enter the body through cuts or sores. Cellulitis most commonly occurs in the arms or lower legs.  HOME CARE INSTRUCTIONS  If you are given a prescription for medications which kill germs (antibiotics), take as directed until finished.   If the infection is on the arm or leg, keep the limb elevated as able.   Use a warm cloth several times per day to relieve pain and encourage healing.   See your caregiver for recheck of the infected site in 103 or sooner if problems arise.   Only take over-the-counter or prescription medicines for pain, discomfort, or fever as directed by your caregiver.  SEEK MEDICAL CARE IF:  An oral temperature above 103 develops, not controlled by medication.   The area of redness (inflammation) is spreading, there are red streaks coming from the infected site, or if a part of the infection begins to turn dark in color.   The joint or bone underneath the infected skin becomes painful after the skin has healed.   The infection returns in the same or another area after it seems to have gone away.   A boil or bump swells up. This may be an abscess.   Cleanse daily with mild shampoo and warm water, may apply Witch Hazel Astringent if itchy or irritated  New, unexplained problems such as pain or fever develop.  SEEK IMMEDIATE MEDICAL CARE IF:  You or your child feels drowsy or lethargic.   There is vomiting, diarrhea, or lasting discomfort or feeling ill (malaise) with muscle aches and pains.  MAKE SURE YOU:   Understand these instructions.   Will watch your condition.   Will get help right away if you are not doing well or get worse.  Document Released: 05/22/2005 Document Re-Released: 06/09/2009 [facility] Patient Information 2011 [place], [place].

## 2011-05-05 ENCOUNTER — Encounter: Payer: Self-pay | Admitting: Family Medicine

## 2011-05-05 DIAGNOSIS — L739 Follicular disorder, unspecified: Secondary | ICD-10-CM

## 2011-05-05 HISTORY — DX: Follicular disorder, unspecified: L73.9

## 2011-05-05 NOTE — Assessment & Plan Note (Signed)
Left Occiput with initial erythematous lesion and some increased lymphadenopathy. Start Septra DS i tab po bid and she is asked to cleanse it daily with mild soap and peroxide, report if symptoms do not resolve

## 2011-05-05 NOTE — Assessment & Plan Note (Signed)
Unfortunately patient continues to smoke 1 ppd, she is educated for greater than 3 minutes about the need to quit to decrease her risk of many kinds of infections and illness

## 2011-05-05 NOTE — Progress Notes (Signed)
Amy Leonard [id number] 16-Sep-1960 05/05/2011      Progress Note-Follow Up  Subjective  Chief Complaint  Chief Complaint  Patient presents with  . Painful BUMP on back of head - no injury.  . Headache    HPI  Patient is a 50 year old Caucasian female who is in today for evaluation of a lesion on her left occiput. It's been present for several days and was initially pruritic. She notes she gets stiff and it became more swollen red and painful. She denies any fever or malaise. She does note some mild nasal congestion but denies any ear pain, sore throat, chest pain, palpitations, shortness of breath, GI or GU complaints at this time. She is not taking her Chantix and unfortunately continues to smoke a pack per day  Past Medical History  Diagnosis Date  . Elevated WBC count     nl diff  . Chronic back pain     spinal stenosis  . Tobacco abuse   . Anxiety and depression   . HSV infection     recurrent (side and buttox)  . Migraine   . Glaucoma     ?  . Carpal tunnel syndrome   . HLD (hyperlipidemia)   . Folliculitis 05/05/2011    Past Surgical History  Procedure Date  . Btl   . Epidural steroid injection 2008    Family History  Problem Relation Age of Onset  . Stroke      GM; DM    History   Social History  . Marital Status: Married    Spouse Name: N/A    Number of Children: N/A  . Years of Education: N/A   Occupational History  . Not on file.   Social History Main Topics  . Smoking status: Current Everyday Smoker -- 1.0 packs/day  . Smokeless tobacco: Not on file   Comment: 1 1/2 ppd   . Alcohol Use: Not on file     4-5 beers fri and sat night; usu none during the week; drinking changes based on mood and anger level   . Drug Use: Not on file  . Sexually Active: Not on file   Other Topics Concern  . Not on file   Social History Narrative   Separated; full time and part time job; no regular exercise.     Current Outpatient Prescriptions on File  Prior to Visit  Medication Sig Dispense Refill  . acyclovir (ZOVIRAX) 5 % ointment Apply topically 5 (five) times daily as needed.        . calcium carbonate (TUMS - DOSED IN MG ELEMENTAL CALCIUM) 500 MG chewable tablet Chew by mouth as directed.        . cyclobenzaprine (FLEXERIL) 10 MG tablet Take 10 mg by mouth 3 (three) times daily as needed.        Marland Kitchen ibuprofen (ADVIL,MOTRIN) 800 MG tablet Take 800 mg by mouth 3 (three) times daily after meals. PRN        . traMADol (ULTRAM) 50 MG tablet Take 50 mg by mouth every 6 (six) hours as needed.        . varenicline (CHANTIX STARTING MONTH PAK) 0.5 MG X 11 & 1 MG X 42 tablet Take by mouth 2 (two) times daily. Take one 0.5mg  tablet by mouth once daily for 3 days, then increase to one 0.5mg  tablet twice daily for 3 days, then increase to one 1mg  tablet twice daily.       . varenicline (CHANTIX) 1 MG  tablet Take 1 mg by mouth 2 (two) times daily.          Allergies  Allergen Reactions  . Atorvastatin     REACTION: constipation  . Bupropion Hcl     REACTION: GI, sleepy  . Naproxen Sodium     REACTION: does not tolerate  . Tussionex Pennkinetic Er     REACTION: vomiting    Review of System  Review of Systems  Constitutional: Negative for fever and malaise/fatigue.  HENT: Negative for congestion.   Eyes: Negative for discharge.  Respiratory: Negative for shortness of breath.   Cardiovascular: Negative for chest pain, palpitations and leg swelling.  Gastrointestinal: Negative for nausea, abdominal pain and diarrhea.  Genitourinary: Negative for dysuria.  Musculoskeletal: Negative for falls.  Skin: Negative for rash.       Painful, swollen, pruritic lesion on left occiput  Neurological: Negative for loss of consciousness and headaches.  Endo/Heme/Allergies: Negative for polydipsia.  Psychiatric/Behavioral: Negative for depression and suicidal ideas. The patient is not nervous/anxious and does not have insomnia.    Objective  BP 150/86   Pulse 71  Temp(Src) 98 F (36.7 C) (Oral)  Wt 176 lb (79.833 kg)  SpO2 96%  Physical Exam  Physical Exam  Constitutional: She is oriented to person, place, and time and well-developed, well-nourished, and in no distress. No distress.  HENT:  Head: Normocephalic and atraumatic.  Eyes: Conjunctivae are normal.  Neck: Neck supple. No thyromegaly present.  Cardiovascular: Normal rate, regular rhythm and normal heart sounds.   No murmur heard. Pulmonary/Chest: Effort normal and breath sounds normal. She has no wheezes.  Abdominal: She exhibits no distension and no mass.  Musculoskeletal: She exhibits no edema.  Lymphadenopathy:    She has no cervical adenopathy.  Neurological: She is alert and oriented to person, place, and time.  Skin: Skin is warm and dry. No rash noted. She is not diaphoretic.       Scabbed erythematous lesion over left occiput with some enlarged lymph node noted just below.  Psychiatric: Memory, affect and judgment normal.    Lab Results  Component Value Date   TSH 1.23 01/26/2010   Lab Results  Component Value Date   WBC 11.5* 01/26/2010   HGB 14.0 01/26/2010   HCT 40.4 01/26/2010   MCV 96.2 01/26/2010   PLT 238.0 01/26/2010   Lab Results  Component Value Date   CREATININE 0.6 01/26/2010   BUN 10 01/26/2010   NA 142 01/26/2010   K 4.2 01/26/2010   CL 105 01/26/2010   CO2 30 01/26/2010   Lab Results  Component Value Date   ALT 16 01/26/2010   AST 14 01/26/2010   ALKPHOS 50 01/26/2010   BILITOT 0.5 01/26/2010   Lab Results  Component Value Date   CHOL 203* 01/26/2010   Lab Results  Component Value Date   HDL 51.60 01/26/2010   Lab Results  Component Value Date   LDLCALC 121* 04/05/2008   Lab Results  Component Value Date   TRIG 97.0 01/26/2010   Lab Results  Component Value Date   CHOLHDL 4 01/26/2010     Assessment & Plan  Folliculitis Left Occiput with initial erythematous lesion and some increased lymphadenopathy. Start Septra DS i tab po bid and she is  asked to cleanse it daily with mild soap and peroxide, report if symptoms do not resolve  TOBACCO USE Unfortunately patient continues to smoke 1 ppd, she is educated for greater than 3 minutes about the  need to quit to decrease her risk of many kinds of infections and illness

## 2011-05-06 ENCOUNTER — Telehealth: Payer: Self-pay | Admitting: *Deleted

## 2011-05-06 NOTE — Telephone Encounter (Signed)
[facility] Triage Call Report Triage Record Num: [id number] Operator: Frederico Hamman Patient Name: Amy Leonard Call Date & Time: 05/03/2011 10:19:07PM Patient Phone: [phone] PCP: Audrie Gallus. [facility] Patient Gender: Female PCP Fax : Patient DOB: [birth year] Practice Name: [facility] Reason for Call: Ozella states she had onset of painful knot behind her ear onset when she turns her head. States she had onset of swelling behind ear that causes shooting pains up head. States she has not had period x 8 months.Had menses x 2 weeks. Had migraines on daily basis last week. Has been having intermittent vision problems. States area behind ear is red, enlarged and tender-may be draining sticky drainage. Per swollen lymph node protocol advised to be seen within 4 hr due to swelling and redness, tenderness. States she cannot afford $100 copay for ED. Will call [facility] office in AM to schedule appt. Care advice given. Protocol(s) Used: Swollen Lymph Nodes Recommended Outcome per Protocol: See Provider within 4 hours Reason for Outcome: Swollen glands AND any new symptoms of localized infection (redness, tenderness, warm to touch, increasing in size, etc.) Care Advice: ~ Do not try to pop a lump yourself by pressing on it or poking it with a needle. Call provider if you develop a temperature of 101.5 F (38.6 C) or greater, OR any temperature elevation if geriatric or immunocompromised (such as diabetes, HIV/AIDS, renal disease, chemotherapy, organ transplant, or chronic steroid use). ~ Apply warm, moist soaks or compresses to the affected area for 20-30 minutes 3 to 4 times per day. Avoid burning skin by using water no hotter than bath water and by not lying on the compresses. ~ ~ SYMPTOM / CONDITION MANAGEMENT ~ List, or take, all current prescription(s), nonprescription or alternative medication(s) to provider for evaluation. Analgesic/Antipyretic Advice - Acetaminophen: Consider  acetaminophen as directed on label or by pharmacist/provider for pain or fever PRECAUTIONS: - Use if there is no history of liver disease, alcoholism, or intake of three or more alcohol drinks per day - Only if approved by provider during pregnancy or when breastfeeding - During pregnancy, acetaminophen should not be taken more than 3 consecutive days without telling provider - Do not exceed recommended dose or frequency ~ Analgesic/Antipyretic Advice - NSAIDs: Consider aspirin, ibuprofen, naproxen or ketoprofen for pain or fever as directed on label or by pharmacist/provider. PRECAUTIONS: - If over 38 years of age, should not take longer than 1 week without consulting provider. EXCEPTIONS: - Should not be used if taking blood thinners or have bleeding problems. - Do not use if have history of sensitivity/allergy to any of these medications; or history of cardiovascular, ulcer, kidney, liver disease or diabetes unless approved by provider. - Do not exceed recommended dose or frequency. ~ 05/03/2011 10:47:44PM Page 1 of 1 [email or web address]_[email or web address]_V2

## 2011-05-15 NOTE — Telephone Encounter (Signed)
Yes- and pt did follow up the next day and was treated

## 2011-05-15 NOTE — Telephone Encounter (Signed)
Dr. Tower, have you seen this note? 

## 2011-09-30 ENCOUNTER — Encounter: Payer: Self-pay | Admitting: Family Medicine

## 2011-09-30 ENCOUNTER — Ambulatory Visit (INDEPENDENT_AMBULATORY_CARE_PROVIDER_SITE_OTHER): Payer: BC Managed Care – PPO | Admitting: Family Medicine

## 2011-09-30 ENCOUNTER — Telehealth: Payer: Self-pay | Admitting: Family Medicine

## 2011-09-30 VITALS — BP 162/98 | HR 80 | Temp 98.4°F | Wt 176.0 lb

## 2011-09-30 DIAGNOSIS — K602 Anal fissure, unspecified: Secondary | ICD-10-CM

## 2011-09-30 NOTE — Assessment & Plan Note (Signed)
Fissure and nonthrombosed hemorrhoid noted.  Likely related to recent illness and diarrhea.  F/u prn.  Supportive tx.  No indication for further w/u at this point, consider if bleeding persists. She agrees.

## 2011-09-30 NOTE — Telephone Encounter (Signed)
Triage Record Num: [id number] Operator: Caswell Corwin Patient Name: Amy Leonard Call Date & Time: 09/30/2011 10:19:35AM Patient Phone: [phone] PCP: Audrie Gallus. [facility] Patient Gender: Female PCP Fax : Patient DOB: 06-Sep-1960 Practice Name: [facility] Reason for Call: Caller: [name]/Patient; PCP: [name]; CB#: [phone]; Call regarding Blood in Stool 09/29/11 with diarrhea. Then saw it this AM with 2 bowell movements. Last one was at 0700 and was bright red and also having some nausea. Triaged GI Bldg and pt seeing frank red blood. Needs to be seen in the E/R per Nsg Judgement, but requesting an ofc visit due to cost. Called ofc and spoke with [name] and appt made for 1445 with Para March. Call back inst given. Protocol(s) Used: Gastrointestinal Bleeding Recommended Outcome per Protocol: See ED Immediately Reason for Outcome: Bloody diarrhea AND has not been evaluated Care Advice: ~ 09/30/2011 10:58:07AM Page 1 of 1 CAN_TriageRpt_V2

## 2011-09-30 NOTE — Telephone Encounter (Signed)
Patient is scheduled to see Dr. Para March today.

## 2011-09-30 NOTE — Patient Instructions (Signed)
Avoid prolonged sitting on the toilet, use tucks pads or similar along with prep H.  This should gradually improved.  If continued, then notify me or Tower.

## 2011-09-30 NOTE — Progress Notes (Signed)
Nausea w/o vomiting yesterday.  Diarrhea last night.  Rectal bleeding noted in this AM.  BRBPR.  No fevers. Some stomach cramping.  No more diarrhea or nausea since this AM.  No h/o known GI pathology per patient and no FH colon CA.  Occ ibuprofen use for back pain. No orthostatic sx.    Meds, vitals, and allergies reviewed.   ROS: See HPI.  Otherwise, noncontributory.  nad ncat rrr ctab Rectal exam- chaperoned exam.  Fissure and nonthrombosed hemorrhoid noted.  No gross blood.

## 2012-01-10 ENCOUNTER — Ambulatory Visit (INDEPENDENT_AMBULATORY_CARE_PROVIDER_SITE_OTHER): Payer: BC Managed Care – PPO | Admitting: Family Medicine

## 2012-01-10 ENCOUNTER — Encounter: Payer: Self-pay | Admitting: Family Medicine

## 2012-01-10 VITALS — BP 140/92 | HR 70 | Temp 97.9°F | Ht 64.0 in | Wt 177.0 lb

## 2012-01-10 DIAGNOSIS — J069 Acute upper respiratory infection, unspecified: Secondary | ICD-10-CM | POA: Insufficient documentation

## 2012-01-10 DIAGNOSIS — F17200 Nicotine dependence, unspecified, uncomplicated: Secondary | ICD-10-CM

## 2012-01-10 MED ORDER — GUAIFENESIN-CODEINE 100-10 MG/5ML PO SYRP: 5.0000 mL | ORAL_SOLUTION | Freq: Four times a day (QID) | ORAL | Status: AC | PRN

## 2012-01-10 NOTE — Progress Notes (Signed)
Subjective:    Patient ID: Amy Leonard, female    DOB: 05/21/1961, 51 y.o.   MRN: [id number]  HPI Last sat night - got sick with uri symptoms  Came home from work and one side of her nose burned and then sore throat worse on that side  Monday was running low grade fever- chills/sweats Congestion = nasal is bad Cough now - pretty bad - with mucous production -- was more prod last week  Not a lot of sinus pain Got dizzy last night  Ears are full feeling   Little wheeze - not bad Does not use inhaler  Still smokes -- about the same Not ready to quit yet  Does not think she has symptoms from smoking- like baseline sob or wheeze   Patient Active Problem List  Diagnoses  . HERPES SIMPLEX INFECTION  . LACTOSE INTOLERANCE  . PURE HYPERCHOLESTEROLEMIA  . ANXIETY DEPRESSION  . TOBACCO USE  . MIGRAINE VARIANT  . CARPAL TUNNEL SYNDROME  . UNSPECIFIED GLAUCOMA  . ESSENTIAL HYPERTENSION  . VAGINITIS, BACTERIAL  . IRREGULAR MENSES  . SPINAL STENOSIS  . BACK PAIN, CHRONIC  . LEG PAIN, RIGHT  . Edema  . Rectal fissure  . Viral URI with cough   Past Medical History  Diagnosis Date  . Elevated WBC count     nl diff  . Chronic back pain     spinal stenosis  . Tobacco abuse   . Anxiety and depression   . HSV infection     recurrent (side and buttox)  . Migraine   . Glaucoma     ?  . Carpal tunnel syndrome   . HLD (hyperlipidemia)   . Folliculitis 05/05/2011  . Anxiety   . Depression    Past Surgical History  Procedure Date  . Btl   . Epidural steroid injection 2008   History  Substance Use Topics  . Smoking status: Current Everyday Smoker -- 2.0 packs/day    Types: Cigarettes  . Smokeless tobacco: Not on file   Comment: 1 1/2 ppd   . Alcohol Use: Yes     4-5 beers fri and sat night; usu none during the week; drinking changes based on mood and anger level    Family History  Problem Relation Age of Onset  . Stroke      GM; DM  . Diabetes Mother   .  Hypertension Mother   . Leukemia Mother   . Obesity Mother   . Depression Sister   . Bipolar disorder Son     Bipolar / oppositional defiant  . Alcohol abuse Sister   . Drug abuse Sister   . Drug abuse Brother   . Alcohol abuse Brother   . Alcohol abuse Sister   . Drug abuse Sister   . Colon cancer Neg Hx    Allergies  Allergen Reactions  . Atorvastatin     REACTION: constipation  . Bupropion Hcl     REACTION: GI, sleepy  . Hydrocod Polst-Cpm Polst Er     REACTION: vomiting  . Naproxen Sodium     REACTION: does not tolerate   Current Outpatient Prescriptions on File Prior to Visit  Medication Sig Dispense Refill  . acyclovir (ZOVIRAX) 5 % ointment Apply topically 5 (five) times daily as needed.        . calcium carbonate (TUMS - DOSED IN MG ELEMENTAL CALCIUM) 500 MG chewable tablet Chew by mouth as directed.        Marland Kitchen  cyclobenzaprine (FLEXERIL) 10 MG tablet Take 10 mg by mouth 3 (three) times daily as needed.        Marland Kitchen ibuprofen (ADVIL,MOTRIN) 800 MG tablet Take 800 mg by mouth 3 (three) times daily after meals. PRN        . traMADol (ULTRAM) 50 MG tablet Take 50 mg by mouth every 6 (six) hours as needed.            Review of Systems Review of Systems  Constitutional: Negative pos for fever/ fatigue/ malaise Eyes: Negative for pain and visual disturbance.  ENT pos for congestion / ST/ neg for sinus pain  Respiratory: Negative for sob/ pos for cough/ mild wheeze Cardiovascular: Negative for cp or palpitations    Gastrointestinal: Negative for nausea, diarrhea and constipation.  Genitourinary: Negative for urgency and frequency.  Skin: Negative for pallor or rash   Neurological: Negative for weakness, light-headedness, numbness and headaches.  Hematological: Negative for adenopathy. Does not bruise/bleed easily.  Psychiatric/Behavioral: Negative for dysphoric mood. The patient is not nervous/anxious.         Objective:   Physical Exam  Constitutional: She appears  well-developed and well-nourished. No distress.  HENT:  Head: Normocephalic and atraumatic.  Right Ear: External ear normal.  Left Ear: External ear normal.  Mouth/Throat: Oropharynx is clear and moist. No oropharyngeal exudate.       Nares are injected and congested  No sinus tenderness  Eyes: Conjunctivae and EOM are normal. Pupils are equal, round, and reactive to light. Right eye exhibits no discharge. Left eye exhibits no discharge.  Neck: Normal range of motion. Neck supple. No JVD present. No thyromegaly present.  Cardiovascular: Normal rate, regular rhythm and normal heart sounds.   Pulmonary/Chest: Effort normal. No respiratory distress. She has wheezes. She has no rales. She exhibits no tenderness.       Diffusely distant bs Harsh bs in all areas Scant wheeze on forced exp only   Musculoskeletal: She exhibits no edema.  Lymphadenopathy:    She has no cervical adenopathy.  Neurological: She is alert. She has normal reflexes.  Skin: Skin is warm and dry. No rash noted. No erythema. No pallor.  Psychiatric: She has a normal mood and affect.          Assessment & Plan:

## 2012-01-10 NOTE — Patient Instructions (Signed)
You have a head and chest cold that need to run their course  Drink lots of water and get rest  Try the guifenesin/ codeine cough med with caution  For congestion - try zyrtec 10mg  once daily over the counter  Try nasal saline spray too Update if not starting to improve in a week or if worsening   Try to quit smoking

## 2012-01-10 NOTE — Assessment & Plan Note (Signed)
Disc in detail risks of smoking and possible outcomes including copd, vascular/ heart disease, cancer , respiratory and sinus infections  Pt voices understanding Pt not ready to quit yet 

## 2012-01-10 NOTE — Assessment & Plan Note (Signed)
Disc symptomatic care - see instructions on AVS  Will tx cough with robitussin ac with caution Fluids/ rest Update if not starting to improve in a week or if worsening  - esp if wheeze Adv to quit smoking

## 2012-04-15 ENCOUNTER — Encounter: Payer: Self-pay | Admitting: Family Medicine

## 2012-04-15 ENCOUNTER — Ambulatory Visit (INDEPENDENT_AMBULATORY_CARE_PROVIDER_SITE_OTHER): Payer: BC Managed Care – PPO | Admitting: Family Medicine

## 2012-04-15 VITALS — BP 130/82 | HR 86 | Temp 98.2°F | Ht 64.0 in | Wt 175.8 lb

## 2012-04-15 DIAGNOSIS — R2689 Other abnormalities of gait and mobility: Secondary | ICD-10-CM

## 2012-04-15 DIAGNOSIS — Z1231 Encounter for screening mammogram for malignant neoplasm of breast: Secondary | ICD-10-CM | POA: Insufficient documentation

## 2012-04-15 DIAGNOSIS — Z9181 History of falling: Secondary | ICD-10-CM

## 2012-04-15 DIAGNOSIS — R29818 Other symptoms and signs involving the nervous system: Secondary | ICD-10-CM

## 2012-04-15 DIAGNOSIS — R269 Unspecified abnormalities of gait and mobility: Secondary | ICD-10-CM

## 2012-04-15 NOTE — Progress Notes (Signed)
Subjective:    Patient ID: Amy Leonard, female    DOB: 1961-01-16, 51 y.o.   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 HERPES SIMPLEX INFECTION  . LACTOSE INTOLERANCE  . PURE HYPERCHOLESTEROLEMIA  . ANXIETY DEPRESSION  . TOBACCO USE  . MIGRAINE VARIANT  . CARPAL TUNNEL SYNDROME  . UNSPECIFIED GLAUCOMA  . ESSENTIAL HYPERTENSION  . VAGINITIS, BACTERIAL  . IRREGULAR MENSES  . SPINAL STENOSIS  . BACK PAIN, CHRONIC  . LEG PAIN, RIGHT  . Edema  . Rectal fissure  . Viral URI with cough   Past Medical History  Diagnosis Date  . Elevated WBC count     nl diff  . Chronic back pain     spinal stenosis  . Tobacco abuse   . Anxiety and depression   . HSV infection     recurrent (side and buttox)  . Migraine   . Glaucoma     ?  . Carpal tunnel syndrome   . HLD (hyperlipidemia)   . Folliculitis 05/05/2011  . Anxiety   . Depression    Past Surgical History  Procedure Date  . Btl   . Epidural steroid injection 2008   History  Substance Use Topics  . Smoking status: Current Everyday Smoker -- 2.0 packs/day    Types: Cigarettes  . Smokeless tobacco: Not on file   Comment: 1 1/2 ppd   . Alcohol Use: Yes     4-5 beers fri and sat night; usu none during the week;  drinking changes based on mood and anger level    Family History  Problem Relation Age of Onset  . Stroke      GM; DM  . Diabetes Mother   . Hypertension Mother   . Leukemia Mother   . Obesity Mother   . Depression Sister   . Bipolar disorder Son     Bipolar / oppositional defiant  . Alcohol abuse Sister   . Drug abuse Sister   . Drug abuse Brother   . Alcohol abuse Brother   . Alcohol abuse Sister   . Drug abuse Sister   . Colon cancer Neg Hx    Allergies  Allergen Reactions  . Atorvastatin     REACTION: constipation  . Bupropion Hcl     REACTION: GI, sleepy  . Hydrocod Polst-Cpm Polst Er     REACTION: vomiting  . Naproxen Sodium     REACTION: does not tolerate   Current Outpatient Prescriptions on File Prior to Visit  Medication Sig Dispense Refill  . ibuprofen (  ADVIL,MOTRIN) 800 MG tablet Take 800 mg by mouth 3 (three) times daily after meals. PRN        . acyclovir (ZOVIRAX) 5 % ointment Apply topically 5 (five) times daily as needed.        . calcium carbonate (TUMS - DOSED IN MG ELEMENTAL CALCIUM) 500 MG chewable tablet Chew by mouth as directed.        . cyclobenzaprine (FLEXERIL) 10 MG tablet Take 10 mg by mouth 3 (three) times daily as needed.        . traMADol (ULTRAM) 50 MG tablet Take 50 mg by mouth every 6 (six) hours as needed.              Review of Systems Review of Systems  Constitutional: Negative for fever, appetite change, fatigue and unexpected weight change.  Eyes: Negative for pain and visual disturbance.  Respiratory: Negative for cough and shortness of breath.   Cardiovascular: Negative for cp or palpitations    Gastrointestinal: Negative for nausea, diarrhea and constipation.  Genitourinary: Negative for urgency and frequency.  Skin: Negative for pallor or rash   Neurological: Negative for weakness, , numbness and pos for headaches and poor balance  Hematological: Negative for adenopathy. Does not bruise/bleed easily.    Psychiatric/Behavioral: Negative for dysphoric mood. The patient is not nervous/anxious.         Objective:   Physical Exam  Constitutional: She is oriented to person, place, and time. She appears well-developed and well-nourished. No distress.  HENT:  Head: Normocephalic and atraumatic.  Mouth/Throat: Oropharynx is clear and moist.  Eyes: Conjunctivae and EOM are normal. Pupils are equal, round, and reactive to light. No scleral icterus.  Neck: Normal range of motion. Neck supple. No JVD present. Carotid bruit is not present. No thyromegaly present.  Cardiovascular: Normal rate, regular rhythm, normal heart sounds and intact distal pulses.  Exam reveals no gallop.   Pulmonary/Chest: Effort normal and breath sounds normal. No respiratory distress. She has no wheezes.  Abdominal: Soft. Bowel sounds are normal. She exhibits no distension, no abdominal bruit and no mass. There is no tenderness.  Musculoskeletal: She exhibits no edema and no tenderness.  Lymphadenopathy:    She has no cervical adenopathy.  Neurological: She is alert and oriented to person, place, and time. She has normal strength and normal reflexes. She displays no atrophy and no tremor. No cranial nerve deficit or sensory deficit. She exhibits normal muscle tone. She displays a negative Romberg sign. Coordination and gait normal.       No focal cerebellar signs Nl gait today on exam Some generalized difficulty with tandem gait Nl toe and heel walking    Skin: Skin is warm and dry. No rash noted. No erythema. No pallor.  Psychiatric: She has a normal mood and affect. Her behavior is normal. Judgment and thought content normal.       Pleasant and talkative, but seems very stressed           Assessment & Plan:

## 2012-04-15 NOTE — Patient Instructions (Addendum)
Your exam is very reassuring today Think hard about quitting smoking  We will schedule CT of head at check out If symptoms worsen please let me know

## 2012-04-15 NOTE — Assessment & Plan Note (Signed)
Balance issues/ some headaches/ falls- in smoker - is worse during stress Check CT of head- if abn would do MRI reassurring exam today  Disc smoking cessation and stroke risks

## 2012-04-16 ENCOUNTER — Ambulatory Visit (INDEPENDENT_AMBULATORY_CARE_PROVIDER_SITE_OTHER)
Admission: RE | Admit: 2012-04-16 | Discharge: 2012-04-16 | Disposition: A | Discharge status: Home / Self Care | Payer: BC Managed Care – PPO | Source: Ambulatory Visit | Attending: Family Medicine | Admitting: Family Medicine

## 2012-04-16 ENCOUNTER — Telehealth: Payer: Self-pay

## 2012-04-16 DIAGNOSIS — I639 Cerebral infarction, unspecified: Secondary | ICD-10-CM

## 2012-04-16 DIAGNOSIS — Z9181 History of falling: Secondary | ICD-10-CM

## 2012-04-16 DIAGNOSIS — R2689 Other abnormalities of gait and mobility: Secondary | ICD-10-CM

## 2012-04-16 DIAGNOSIS — R269 Unspecified abnormalities of gait and mobility: Secondary | ICD-10-CM

## 2012-04-16 DIAGNOSIS — I10 Essential (primary) hypertension: Secondary | ICD-10-CM

## 2012-04-16 DIAGNOSIS — R29818 Other symptoms and signs involving the nervous system: Secondary | ICD-10-CM

## 2012-04-16 DIAGNOSIS — I6381 Other cerebral infarction due to occlusion or stenosis of small artery: Secondary | ICD-10-CM

## 2012-04-16 NOTE — Assessment & Plan Note (Signed)
See assesment for gait disorder- seems to be intermittent, perhaps fatigue or stress induced

## 2012-04-16 NOTE — Telephone Encounter (Signed)
Pt notified earlier needs MRI of brain; pt said she needs MRI of back also due to back pain and wanted to know if cost would be same to get full body MRI instead of just the head. I explained to pt the cost would not be the same but pt wanted note sent to Dr Milinda Antis for her opinion.Please advise.

## 2012-04-16 NOTE — Assessment & Plan Note (Signed)
mammo scheduled Will do breast exam at upcoming PE

## 2012-04-17 ENCOUNTER — Telehealth: Payer: Self-pay | Admitting: Family Medicine

## 2012-04-17 DIAGNOSIS — I6381 Other cerebral infarction due to occlusion or stenosis of small artery: Secondary | ICD-10-CM | POA: Insufficient documentation

## 2012-04-17 DIAGNOSIS — I639 Cerebral infarction, unspecified: Secondary | ICD-10-CM

## 2012-04-17 DIAGNOSIS — Z8673 Personal history of transient ischemic attack (TIA), and cerebral infarction without residual deficits: Secondary | ICD-10-CM | POA: Insufficient documentation

## 2012-04-17 HISTORY — DX: Other cerebral infarction due to occlusion or stenosis of small artery: I63.81

## 2012-04-17 HISTORY — DX: Cerebral infarction, unspecified: I63.9

## 2012-04-17 NOTE — Telephone Encounter (Signed)
Amy Leonard is going  to do the referral and I asked her to schedule appt to have labs done.

## 2012-04-17 NOTE — Telephone Encounter (Signed)
MRA without MRI with and without please

## 2012-04-17 NOTE — Telephone Encounter (Signed)
Cannot do both  I will go ahead and do the order for the MR of the brain  She also needs labs for BUN/ CR for contrast- please schedule lab draw before her MRI

## 2012-04-17 NOTE — Telephone Encounter (Signed)
Ok will do. Thanks Dr. Milinda Antis. Can you put them in as 2 separate orders they won't let me schedule them as one order. One for MRA and then another one for the MRI.

## 2012-04-17 NOTE — Telephone Encounter (Signed)
Dr. Milinda Antis I spoke with pt and Bjorn Loser concerning referral for the MRA and MRI. I also spoke with [facility] as well. The young lady says that we could do both a MRA of the Head without contrast and a MRI of the Brain either w and w/o or just w/o. Or we could just do one of the procedures. Wasn't which way you wanted to go ????

## 2012-04-20 NOTE — Telephone Encounter (Signed)
Orders done

## 2012-04-21 ENCOUNTER — Other Ambulatory Visit (INDEPENDENT_AMBULATORY_CARE_PROVIDER_SITE_OTHER): Payer: BC Managed Care – PPO

## 2012-04-21 DIAGNOSIS — I10 Essential (primary) hypertension: Secondary | ICD-10-CM

## 2012-04-21 LAB — BASIC METABOLIC PANEL
BUN: 13 mg/dL (ref 6–23)
CO2: 29 mEq/L (ref 19–32)
Calcium: 9.3 mg/dL (ref 8.4–10.5)
Creatinine, Ser: 0.8 mg/dL (ref 0.4–1.2)
GFR: 82.57 mL/min (ref >60.00)
Glucose, Bld: 90 mg/dL (ref 70–99)

## 2012-04-21 NOTE — Addendum Note (Signed)
Addended by: Baldomero Lamy on: 04/21/2012 03:05 PM   Modules accepted: Orders

## 2012-04-25 ENCOUNTER — Other Ambulatory Visit: Payer: BC Managed Care – PPO

## 2012-04-26 ENCOUNTER — Telehealth: Payer: Self-pay | Admitting: Family Medicine

## 2012-04-26 ENCOUNTER — Ambulatory Visit
Admission: RE | Admit: 2012-04-26 | Discharge: 2012-04-26 | Disposition: A | Discharge status: Home / Self Care | Payer: BC Managed Care – PPO | Source: Ambulatory Visit | Attending: Family Medicine | Admitting: Family Medicine

## 2012-04-26 DIAGNOSIS — I6381 Other cerebral infarction due to occlusion or stenosis of small artery: Secondary | ICD-10-CM

## 2012-04-26 DIAGNOSIS — I639 Cerebral infarction, unspecified: Secondary | ICD-10-CM

## 2012-04-26 MED ORDER — GADOBENATE DIMEGLUMINE 529 MG/ML IV SOLN
15.0000 mL | Freq: Once | INTRAVENOUS | Status: AC | PRN
  Administered 2012-04-26: 15 mL via INTRAVENOUS

## 2012-04-26 NOTE — Telephone Encounter (Signed)
Opened in error

## 2012-05-01 ENCOUNTER — Ambulatory Visit (INDEPENDENT_AMBULATORY_CARE_PROVIDER_SITE_OTHER): Payer: BC Managed Care – PPO | Admitting: Family Medicine

## 2012-05-01 ENCOUNTER — Encounter: Payer: Self-pay | Admitting: Family Medicine

## 2012-05-01 VITALS — BP 128/82 | HR 104 | Ht 64.0 in | Wt 175.8 lb

## 2012-05-01 DIAGNOSIS — A088 Other specified intestinal infections: Secondary | ICD-10-CM

## 2012-05-01 DIAGNOSIS — F17200 Nicotine dependence, unspecified, uncomplicated: Secondary | ICD-10-CM

## 2012-05-01 DIAGNOSIS — I6381 Other cerebral infarction due to occlusion or stenosis of small artery: Secondary | ICD-10-CM

## 2012-05-01 DIAGNOSIS — E7800 Pure hypercholesterolemia, unspecified: Secondary | ICD-10-CM

## 2012-05-01 DIAGNOSIS — I639 Cerebral infarction, unspecified: Secondary | ICD-10-CM

## 2012-05-01 DIAGNOSIS — A084 Viral intestinal infection, unspecified: Secondary | ICD-10-CM | POA: Insufficient documentation

## 2012-05-01 DIAGNOSIS — I6350 Cerebral infarction due to unspecified occlusion or stenosis of unspecified cerebral artery: Secondary | ICD-10-CM

## 2012-05-01 MED ORDER — TRAMADOL HCL 50 MG PO TABS: 50.0000 mg | ORAL_TABLET | Freq: Three times a day (TID) | ORAL | Status: DC | PRN

## 2012-05-01 NOTE — Progress Notes (Signed)
Subjective:    Patient ID: Amy Leonard, female    DOB: September 12, 1960, 51 y.o.   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   Also small vessel disease posterior   Has started cutting back on the cigarettes  Right now down from 1ppd to 1/3 ppd  Since her boyfriend does not smoke -- this is most helpful   Has started 81 mg asa daily - no problems with that   Lab Results  Component Value Date   CHOL 203* 01/26/2010   HDL 51.60 01/26/2010   LDLCALC 121* 04/05/2008   LDLDIRECT 130.3 01/26/2010   TRIG 97.0 01/26/2010   CHOLHDL 4 01/26/2010   intolerant of chol med in the past  Would be willing to try again, however Diet is not the best but can work on it  Goal for LDL is going to be under 100  Patient Active Problem List  Diagnosis  . HERPES SIMPLEX INFECTION  . LACTOSE INTOLERANCE  . PURE HYPERCHOLESTEROLEMIA  . ANXIETY DEPRESSION  . TOBACCO USE  . MIGRAINE VARIANT  . CARPAL TUNNEL SYNDROME  . UNSPECIFIED GLAUCOMA  . ESSENTIAL HYPERTENSION  . VAGINITIS, BACTERIAL  . IRREGULAR MENSES  . SPINAL STENOSIS  . BACK PAIN, CHRONIC  . LEG PAIN, RIGHT  . Edema  . Rectal fissure  . Viral URI with cough  . Loss of balance  . Gait disorder  . History of falling  . Other screening mammogram  . Lacunar infarction  . Basal ganglia infarction  . Viral gastroenteritis   Past Medical History  Diagnosis Date  . Elevated WBC count     nl diff  . Chronic back pain     spinal stenosis  . Tobacco abuse   . Anxiety and depression   . HSV infection     recurrent (side and buttox)  . Migraine   . Glaucoma     ?  . Carpal tunnel syndrome   . HLD (hyperlipidemia)   . Folliculitis  05/05/2011  . Anxiety   . Depression    Past Surgical History  Procedure Date  . Btl   . Epidural steroid injection 2008   History  Substance Use Topics  . Smoking status: Current Everyday Smoker -- 2.0 packs/day    Types: Cigarettes  . Smokeless tobacco: Not on file   Comment: 1 1/2 ppd   . Alcohol Use: Yes     4-5 beers fri and sat night; usu none during the week; drinking changes based on mood and anger level    Family History  Problem Relation Age of Onset  . Stroke      GM; DM  . Diabetes Mother   . Hypertension Mother   . Leukemia Mother   . Obesity Mother   . Depression Sister   . Bipolar disorder Son     Bipolar / oppositional defiant  . Alcohol abuse Sister   . Drug abuse Sister   . Drug abuse Brother   . Alcohol abuse Brother   . Alcohol abuse Sister   . Drug abuse Sister   . Colon cancer  Neg Hx    Allergies  Allergen Reactions  . Atorvastatin     REACTION: constipation  . Bupropion Hcl     REACTION: GI, sleepy  . Hydrocod Polst-Cpm Polst Er     REACTION: vomiting  . Naproxen Sodium     REACTION: does not tolerate   Current Outpatient Prescriptions on File Prior to Visit  Medication Sig Dispense Refill  . acyclovir (ZOVIRAX) 5 % ointment Apply topically 5 (five) times daily as needed.        . calcium carbonate (TUMS - DOSED IN MG ELEMENTAL CALCIUM) 500 MG chewable tablet Chew by mouth as directed.        . cyclobenzaprine (FLEXERIL) 10 MG tablet Take 10 mg by mouth 3 (three) times daily as needed.        Marland Kitchen ibuprofen (ADVIL,MOTRIN) 800 MG tablet Take 800 mg by mouth 3 (three) times daily after meals. PRN              Review of Systems     Objective:   Physical Exam  Constitutional: She is oriented to person, place, and time. She appears well-developed and well-nourished. No distress.       obese and well appearing   HENT:  Head: Normocephalic and atraumatic.  Right Ear: External ear normal.  Left Ear: External ear normal.  Nose: Nose  normal.  Mouth/Throat: Oropharynx is clear and moist.       No facial droop noted  Eyes: Conjunctivae and EOM are normal. Pupils are equal, round, and reactive to light. Right eye exhibits no discharge. Left eye exhibits no discharge. No scleral icterus.  Neck: Normal range of motion. Neck supple. No JVD present. Carotid bruit is not present. No thyromegaly present.  Cardiovascular: Normal rate, regular rhythm, normal heart sounds and intact distal pulses.  Exam reveals no gallop.   Pulmonary/Chest: Effort normal and breath sounds normal. No respiratory distress. She has no wheezes.       Diffusely distant bs   Abdominal: Soft. She exhibits no distension, no abdominal bruit and no mass. There is no tenderness. There is no rebound and no guarding.       Mildly overactive bs Not high pitched or tinkling  Musculoskeletal: She exhibits no edema and no tenderness.  Lymphadenopathy:    She has no cervical adenopathy.  Neurological: She is alert and oriented to person, place, and time. She has normal reflexes. She displays no atrophy and no tremor. No cranial nerve deficit or sensory deficit. She exhibits normal muscle tone. She displays a negative Romberg sign. She displays no seizure activity. Coordination and gait normal.       No focal cerebellar signs today- but she does have some trouble with tandem walk  Is also generally not feeling well, however  Skin: Skin is warm and dry. No rash noted. No erythema. No pallor.       Brisk capillary refil  Psychiatric: She has a normal mood and affect.          Assessment & Plan:

## 2012-05-01 NOTE — Patient Instructions (Addendum)
For now - sips of clear fluids until you are feeling better - Update if not starting to improve in a week or if worsening   When better-low cholesterol diet (Avoid red meat/ fried foods/ egg yolks/ fatty breakfast meats/ butter, cheese and high fat dairy/ and shellfish  ) Take 81 mg aspirin daily  No nsaids- tylenol is ok  We will schedule carotid doppler at check out Follow up with me 2 weeks after that  Call your insurance before next visit and find out what statin cholesterol medicines are covered for you  Stop smoking asap!!!!!

## 2012-05-03 NOTE — Assessment & Plan Note (Signed)
While chol is not extremely high -will likely need statin to get to goal  Will disc this at f/u Disc goals for lipids and reasons to control them Rev labs with pt Rev low sat fat diet in detail

## 2012-05-03 NOTE — Assessment & Plan Note (Signed)
Reviewed MRI and MRA Disc risk factors for cva in detail  Will work on quitting smoking/ continue asa daily and get carotid dopplers If neg consider echo Suspect small vessel dz however- disc this in detail  Stable exam today  Pt aware to call 911 for any cva symptoms

## 2012-05-03 NOTE — Assessment & Plan Note (Signed)
See assessment for basal ganglia infarction Causing balance issues- may consider PT  In future On asa  Will work on smoking cess/ bp control/ chol control / wt loss Carotid dopplers ordered and then f/u

## 2012-05-03 NOTE — Assessment & Plan Note (Signed)
Disc gradual fluid hydration - clear fluids with adv diet  (BRAT) as tolerated Reassuring exam  No abd tenderness or s/s of dehydration

## 2012-05-03 NOTE — Assessment & Plan Note (Signed)
With new hx of CVA in past  Is now motivated to quit on her own  Down to 1/3 ppd  Will hopefully quit entirely before f/u  Disc imp of this Disc in detail risks of smoking and possible outcomes including copd, vascular/ heart disease, cancer , respiratory and sinus infections  Pt voices understanding

## 2012-05-07 ENCOUNTER — Encounter (INDEPENDENT_AMBULATORY_CARE_PROVIDER_SITE_OTHER): Payer: BC Managed Care – PPO

## 2012-05-07 DIAGNOSIS — R269 Unspecified abnormalities of gait and mobility: Secondary | ICD-10-CM

## 2012-05-07 DIAGNOSIS — I6381 Other cerebral infarction due to occlusion or stenosis of small artery: Secondary | ICD-10-CM

## 2012-05-07 DIAGNOSIS — I6529 Occlusion and stenosis of unspecified carotid artery: Secondary | ICD-10-CM

## 2012-05-07 DIAGNOSIS — I639 Cerebral infarction, unspecified: Secondary | ICD-10-CM

## 2012-05-20 ENCOUNTER — Telehealth: Payer: Self-pay | Admitting: *Deleted

## 2012-05-20 ENCOUNTER — Ambulatory Visit (INDEPENDENT_AMBULATORY_CARE_PROVIDER_SITE_OTHER): Payer: BC Managed Care – PPO | Admitting: Family Medicine

## 2012-05-20 ENCOUNTER — Encounter: Payer: Self-pay | Admitting: Family Medicine

## 2012-05-20 VITALS — BP 130/88 | HR 82 | Temp 98.2°F | Ht 64.0 in | Wt 180.5 lb

## 2012-05-20 DIAGNOSIS — I6350 Cerebral infarction due to unspecified occlusion or stenosis of unspecified cerebral artery: Secondary | ICD-10-CM

## 2012-05-20 DIAGNOSIS — R519 Headache, unspecified: Secondary | ICD-10-CM | POA: Insufficient documentation

## 2012-05-20 DIAGNOSIS — I6381 Other cerebral infarction due to occlusion or stenosis of small artery: Secondary | ICD-10-CM

## 2012-05-20 DIAGNOSIS — E7800 Pure hypercholesterolemia, unspecified: Secondary | ICD-10-CM

## 2012-05-20 DIAGNOSIS — I10 Essential (primary) hypertension: Secondary | ICD-10-CM

## 2012-05-20 DIAGNOSIS — F17200 Nicotine dependence, unspecified, uncomplicated: Secondary | ICD-10-CM

## 2012-05-20 DIAGNOSIS — R51 Headache: Secondary | ICD-10-CM | POA: Insufficient documentation

## 2012-05-20 DIAGNOSIS — I639 Cerebral infarction, unspecified: Secondary | ICD-10-CM

## 2012-05-20 LAB — TSH: TSH: 1.05 u[IU]/mL (ref 0.35–5.50)

## 2012-05-20 LAB — LIPID PANEL
HDL: 52.5 mg/dL (ref >39.00)
Total CHOL/HDL Ratio: 4

## 2012-05-20 MED ORDER — VARENICLINE TARTRATE 0.5 MG PO TABS: 0.5000 mg | ORAL_TABLET | Freq: Two times a day (BID) | ORAL | Status: DC

## 2012-05-20 MED ORDER — VARENICLINE TARTRATE 0.5 MG X 11 & 1 MG X 42 PO MISC: ORAL | Status: DC

## 2012-05-20 NOTE — Patient Instructions (Addendum)
Continue your aspirin We will refer you to neurology at check out (headache and stroke) Try the chantix to quit smoking  Get your flu shot at work as soon as you can Labs today-we may have to start cholesterol med again Avoid red meat/ fried foods/ egg yolks/ fatty breakfast meats/ butter, cheese and high fat dairy/ and shellfish

## 2012-05-20 NOTE — Assessment & Plan Note (Signed)
On asa Checking chol- will likely put back on statin bp stable Quitting smoking Balance improved  Lab today- will tx chol  Due to ha will ref to neuro

## 2012-05-20 NOTE — Assessment & Plan Note (Signed)
New with balance issues and cva Ref to neuro

## 2012-05-20 NOTE — Assessment & Plan Note (Signed)
Fairly controlled with lifestyle - disc goals Better on re check today Will monitor carefully

## 2012-05-20 NOTE — Assessment & Plan Note (Signed)
Lab today Will likely re start statin Could not afford lipitor in past- but generic should be ok

## 2012-05-20 NOTE — Assessment & Plan Note (Signed)
In pt with CVA Given px for chantix- counseled on side eff incl depression  Disc in detail risks of smoking and possible outcomes including copd, vascular/ heart disease, cancer , respiratory and sinus infections  Pt voices understanding

## 2012-05-20 NOTE — Assessment & Plan Note (Signed)
Quitting smoking  On asa  No further symptoms except ha  Carotid dopplers neg  Ref to neuro

## 2012-05-20 NOTE — Telephone Encounter (Signed)
[facility] wants to know if you intended pt to have the 0.5mg  BID or the 1mg  BID of chantix after she completes the starter patch?

## 2012-05-20 NOTE — Progress Notes (Signed)
Subjective:    Patient ID: Amy Leonard, female    DOB: [birth year], 51 y.o.   MRN: [id number]  HPI Here for f/u of chronic conditions   Basal ganglia and lacunar infarcts-causing generally poor balance Carotid doppler - no significant stenosis Has not had echo yet   No stroke symptoms since last visit Balance is improving  Does still get a lot of headaches in general  Whole head - forehead  Getting once every 2-3 d and can last over a day  May be coming from her neck - it pops and cracks  Needs eye exam   Not a lot of stress  Is applying for a new job   Aspirin-- 81 mg daily - no problems with that   Smoking- 1/2 ppd- wants to start chantix   bp is up a bit today, was rushing  No cp or palpitations or headaches or edema  No side effects to medicines  BP Readings from Last 3 Encounters:  05/20/12 150/92  05/01/12 128/82  04/15/12 130/82     Lab Results  Component Value Date   CHOL 203* 01/26/2010   HDL 51.60 01/26/2010   LDLCALC 121* 04/05/2008   LDLDIRECT 130.3 01/26/2010   TRIG 97.0 01/26/2010   CHOLHDL 4 01/26/2010   was on lipitor in the past - ins no longer paid for it   Chemistry      Component Value Date/Time   NA 138 04/21/2012 1505   K 3.8 04/21/2012 1505   CL 102 04/21/2012 1505   CO2 29 04/21/2012 1505   BUN 13 04/21/2012 1505   CREATININE 0.8 04/21/2012 1505      Component Value Date/Time   CALCIUM 9.3 04/21/2012 1505   ALKPHOS 50 01/26/2010 1124   AST 14 01/26/2010 1124   ALT 16 01/26/2010 1124   BILITOT 0.5 01/26/2010 1124      Patient Active Problem List  Diagnosis  . HERPES SIMPLEX INFECTION  . LACTOSE INTOLERANCE  . PURE HYPERCHOLESTEROLEMIA  . ANXIETY DEPRESSION  . TOBACCO USE  . MIGRAINE VARIANT  . CARPAL TUNNEL SYNDROME  . UNSPECIFIED GLAUCOMA  . ESSENTIAL HYPERTENSION  . VAGINITIS, BACTERIAL  . IRREGULAR MENSES  . SPINAL STENOSIS  . BACK PAIN, CHRONIC  . LEG PAIN, RIGHT  . Edema  . Rectal fissure  . Viral URI with cough  . Loss of balance    . Gait disorder  . History of falling  . Other screening mammogram  . Lacunar infarction  . Basal ganglia infarction  . Viral gastroenteritis  . Headache   Past Medical History  Diagnosis Date  . Elevated WBC count     nl diff  . Chronic back pain     spinal stenosis  . Tobacco abuse   . Anxiety and depression   . HSV infection     recurrent (side and buttox)  . Migraine   . Glaucoma     ?  . Carpal tunnel syndrome   . HLD (hyperlipidemia)   . Folliculitis 05/05/2011  . Anxiety   . Depression   . 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 Btl   . Epidural steroid injection 2008   History  Substance Use Topics  . Smoking status: Current Every Day Smoker -- 0.5 packs/day    Types: Cigarettes  . Smokeless tobacco: Not on file   Comment: 1 1/2 ppd   . Alcohol Use: Yes     4-5 beers  fri and sat night; usu none during the week; drinking changes based on mood and anger level    Family History  Problem Relation Age of Onset  . Stroke      GM; DM  . Diabetes Mother   . Hypertension Mother   . Leukemia Mother   . Obesity Mother   . Depression Sister   . Bipolar disorder Son     Bipolar / oppositional defiant  . Alcohol abuse Sister   . Drug abuse Sister   . Drug abuse Brother   . Alcohol abuse Brother   . Alcohol abuse Sister   . Drug abuse Sister   . Colon cancer Neg Hx    Allergies  Allergen Reactions  . Atorvastatin     REACTION: constipation  . Bupropion Hcl     REACTION: GI, sleepy  . Hydrocod Polst-Cpm Polst Er     REACTION: vomiting  . Naproxen Sodium     REACTION: does not tolerate   Current Outpatient Prescriptions on File Prior to Visit  Medication Sig Dispense Refill  . acyclovir (ZOVIRAX) 5 % ointment Apply topically as needed.       . calcium carbonate (TUMS - DOSED IN MG ELEMENTAL CALCIUM) 500 MG chewable tablet Chew by mouth as directed.        . cyclobenzaprine (FLEXERIL) 10 MG tablet Take 10 mg by  mouth as needed.       . traMADol (ULTRAM) 50 MG tablet Take 1 tablet (50 mg total) by mouth every 8 (eight) hours as needed.  30 tablet  0     Review of Systems Review of Systems  Constitutional: Negative for fever, appetite change, and unexpected weight change. pos for intermittent chronic fatigue Eyes: Negative for pain and visual disturbance.  Respiratory: Negative for cough and shortness of breath.   Cardiovascular: Negative for cp or palpitations    Gastrointestinal: Negative for nausea, diarrhea and constipation.  Genitourinary: Negative for urgency and frequency.  Skin: Negative for pallor or rash   Neurological: Negative for weakness, light-headedness, numbness and pos for headaches , also poor balance (which has improved) Hematological: Negative for adenopathy. Does not bruise/bleed easily.  Psychiatric/Behavioral: Negative for dysphoric mood. The patient is not nervous/anxious.         Objective:   Physical Exam  Constitutional: She appears well-developed and well-nourished. No distress.       obese and well appearing   HENT:  Head: Normocephalic and atraumatic.  Right Ear: External ear normal.  Left Ear: External ear normal.  Nose: Nose normal.  Mouth/Throat: Oropharynx is clear and moist.       No facial or temporal tenderness  Eyes: Conjunctivae normal and EOM are normal. Pupils are equal, round, and reactive to light. No scleral icterus.       No nystagmus  Neck: Normal range of motion. Neck supple. No JVD present. Carotid bruit is not present. No thyromegaly present.  Cardiovascular: Normal rate, regular rhythm, normal heart sounds and intact distal pulses.  Exam reveals no gallop.   No murmur heard. Pulmonary/Chest: Effort normal and breath sounds normal. No respiratory distress. She has no wheezes.       Diffusely distant bs   Abdominal: Soft. Bowel sounds are normal. She exhibits no distension, no abdominal bruit and no mass. There is no tenderness.    Musculoskeletal: Normal range of motion. She exhibits no edema and no tenderness.  Lymphadenopathy:    She has no cervical adenopathy.  Neurological: She  is alert. She has normal reflexes. She displays no atrophy and no tremor. No cranial nerve deficit or sensory deficit. She exhibits normal muscle tone. She displays no seizure activity. Coordination and gait normal.       Today gait is improved- tandem with no problem and neg rhomberg test Improved   Skin: Skin is warm and dry. No rash noted. No erythema. No pallor.  Psychiatric: She has a normal mood and affect.          Assessment & Plan:

## 2012-05-21 NOTE — Telephone Encounter (Signed)
.  5 bid please - if I was unclear , please change the px - thanks

## 2012-05-21 NOTE — Telephone Encounter (Signed)
Notified [facility]. That pt needs to stay on the 0.5mg  BID after finishing the starter pak

## 2012-05-22 MED ORDER — ATORVASTATIN CALCIUM 10 MG PO TABS: 10.0000 mg | ORAL_TABLET | Freq: Every day | ORAL | Status: DC

## 2012-05-22 NOTE — Addendum Note (Signed)
Addended by: Patience Musca on: 05/22/2012 02:07 PM   Modules accepted: Orders

## 2012-06-30 ENCOUNTER — Telehealth: Payer: Self-pay | Admitting: Family Medicine

## 2012-06-30 DIAGNOSIS — E7800 Pure hypercholesterolemia, unspecified: Secondary | ICD-10-CM

## 2012-06-30 NOTE — Telephone Encounter (Signed)
Message copied by Judy Pimple on Tue Jun 30, 2012  5:42 PM ------      Message from: Baldomero Lamy      Created: Thu Jun 25, 2012  7:20 AM      Regarding: labs on  Wed 11/6 and 12/30??       Pt is scheduled for labs on 11/6 and also cpx labs on 12/30. Does she need to come for both? No orders in for either lab appt.                  Thanks      Rodney Booze

## 2012-06-30 NOTE — Telephone Encounter (Signed)
Yes -tomorrow is just for lipid labs I will put in orders thanks

## 2012-07-01 ENCOUNTER — Other Ambulatory Visit (INDEPENDENT_AMBULATORY_CARE_PROVIDER_SITE_OTHER): Payer: BC Managed Care – PPO

## 2012-07-01 DIAGNOSIS — E7800 Pure hypercholesterolemia, unspecified: Secondary | ICD-10-CM

## 2012-07-01 LAB — LIPID PANEL
HDL: 49.8 mg/dL (ref >39.00)
Total CHOL/HDL Ratio: 3

## 2012-07-01 LAB — AST: AST: 16 U/L (ref 0–37)

## 2012-07-01 LAB — ALT: ALT: 19 U/L (ref 0–35)

## 2012-07-03 ENCOUNTER — Encounter: Payer: Self-pay | Admitting: *Deleted

## 2012-08-23 ENCOUNTER — Telehealth: Payer: Self-pay | Admitting: Family Medicine

## 2012-08-23 DIAGNOSIS — Z0000 Encounter for general adult medical examination without abnormal findings: Secondary | ICD-10-CM | POA: Insufficient documentation

## 2012-08-23 DIAGNOSIS — E7800 Pure hypercholesterolemia, unspecified: Secondary | ICD-10-CM

## 2012-08-23 DIAGNOSIS — I10 Essential (primary) hypertension: Secondary | ICD-10-CM

## 2012-08-23 NOTE — Telephone Encounter (Signed)
Message copied by Judy Pimple on Sun Aug 23, 2012  3:43 PM ------      Message from: Baldomero Lamy      Created: Thu Aug 13, 2012  1:50 PM      Regarding: Cpx labs 12/30 Mon       Please order  future cpx labs for pt's upcoming lab appt.      Thanks      Rodney Booze

## 2012-08-24 ENCOUNTER — Other Ambulatory Visit (INDEPENDENT_AMBULATORY_CARE_PROVIDER_SITE_OTHER): Payer: BC Managed Care – PPO

## 2012-08-24 DIAGNOSIS — Z0000 Encounter for general adult medical examination without abnormal findings: Secondary | ICD-10-CM

## 2012-08-24 DIAGNOSIS — E7800 Pure hypercholesterolemia, unspecified: Secondary | ICD-10-CM

## 2012-08-24 LAB — COMPREHENSIVE METABOLIC PANEL
AST: 15 U/L (ref 0–37)
Albumin: 3.5 g/dL (ref 3.5–5.2)
BUN: 13 mg/dL (ref 6–23)
CO2: 29 mEq/L (ref 19–32)
Calcium: 8.9 mg/dL (ref 8.4–10.5)
Chloride: 104 mEq/L (ref 96–112)
Creatinine, Ser: 0.8 mg/dL (ref 0.4–1.2)
GFR: 81.26 mL/min (ref >60.00)
Potassium: 4.3 mEq/L (ref 3.5–5.1)

## 2012-08-24 LAB — LIPID PANEL
HDL: 45.5 mg/dL (ref >39.00)
Total CHOL/HDL Ratio: 4
Triglycerides: 129 mg/dL (ref 0.0–149.0)

## 2012-08-24 LAB — TSH: TSH: 0.59 u[IU]/mL (ref 0.35–5.50)

## 2012-08-25 LAB — CBC WITH DIFFERENTIAL/PLATELET
Basophils Relative: 0.2 % (ref 0.0–3.0)
Eosinophils Absolute: 0.1 10*3/uL (ref 0.0–0.7)
Eosinophils Relative: 1.3 % (ref 0.0–5.0)
Hemoglobin: 13.8 g/dL (ref 12.0–15.0)
Lymphocytes Relative: 30.2 % (ref 12.0–46.0)
Monocytes Relative: 8.4 % (ref 3.0–12.0)
Neutro Abs: 5.4 10*3/uL (ref 1.4–7.7)
Neutrophils Relative %: 59.9 % (ref 43.0–77.0)
RBC: 4.27 Mil/uL (ref 3.87–5.11)
WBC: 9 10*3/uL (ref 4.5–10.5)

## 2012-08-26 DIAGNOSIS — Z923 Personal history of irradiation: Secondary | ICD-10-CM

## 2012-08-26 DIAGNOSIS — C50919 Malignant neoplasm of unspecified site of unspecified female breast: Secondary | ICD-10-CM

## 2012-08-26 DIAGNOSIS — IMO0001 Reserved for inherently not codable concepts without codable children: Secondary | ICD-10-CM

## 2012-08-26 DIAGNOSIS — Z9221 Personal history of antineoplastic chemotherapy: Secondary | ICD-10-CM

## 2012-08-26 HISTORY — PX: BREAST BIOPSY: SHX20

## 2012-08-26 HISTORY — DX: Personal history of antineoplastic chemotherapy: Z92.21

## 2012-08-26 HISTORY — DX: Reserved for inherently not codable concepts without codable children: IMO0001

## 2012-08-26 HISTORY — DX: Personal history of irradiation: Z92.3

## 2012-08-26 HISTORY — DX: Malignant neoplasm of unspecified site of unspecified female breast: C50.919

## 2012-08-26 HISTORY — PX: BREAST LUMPECTOMY: SHX2

## 2012-09-01 ENCOUNTER — Ambulatory Visit (INDEPENDENT_AMBULATORY_CARE_PROVIDER_SITE_OTHER): Payer: BC Managed Care – PPO | Admitting: Family Medicine

## 2012-09-01 ENCOUNTER — Encounter: Payer: Self-pay | Admitting: Gastroenterology

## 2012-09-01 ENCOUNTER — Other Ambulatory Visit (HOSPITAL_COMMUNITY)
Admission: RE | Admit: 2012-09-01 | Discharge: 2012-09-01 | Disposition: A | Discharge status: Home / Self Care | Payer: BC Managed Care – PPO | Source: Ambulatory Visit | Attending: Family Medicine | Admitting: Family Medicine

## 2012-09-01 ENCOUNTER — Encounter: Payer: Self-pay | Admitting: Family Medicine

## 2012-09-01 VITALS — BP 154/98 | HR 90 | Temp 98.5°F | Ht 63.0 in | Wt 187.8 lb

## 2012-09-01 DIAGNOSIS — E7800 Pure hypercholesterolemia, unspecified: Secondary | ICD-10-CM

## 2012-09-01 DIAGNOSIS — Z01419 Encounter for gynecological examination (general) (routine) without abnormal findings: Secondary | ICD-10-CM | POA: Insufficient documentation

## 2012-09-01 DIAGNOSIS — Z23 Encounter for immunization: Secondary | ICD-10-CM

## 2012-09-01 DIAGNOSIS — Z0000 Encounter for general adult medical examination without abnormal findings: Secondary | ICD-10-CM

## 2012-09-01 DIAGNOSIS — R1310 Dysphagia, unspecified: Secondary | ICD-10-CM | POA: Insufficient documentation

## 2012-09-01 DIAGNOSIS — F17200 Nicotine dependence, unspecified, uncomplicated: Secondary | ICD-10-CM

## 2012-09-01 DIAGNOSIS — Z1211 Encounter for screening for malignant neoplasm of colon: Secondary | ICD-10-CM | POA: Insufficient documentation

## 2012-09-01 DIAGNOSIS — Z1231 Encounter for screening mammogram for malignant neoplasm of breast: Secondary | ICD-10-CM

## 2012-09-01 DIAGNOSIS — I10 Essential (primary) hypertension: Secondary | ICD-10-CM

## 2012-09-01 MED ORDER — LISINOPRIL 10 MG PO TABS: 10.0000 mg | ORAL_TABLET | Freq: Every day | ORAL | Status: DC

## 2012-09-01 NOTE — Assessment & Plan Note (Addendum)
Ref to GI for this - she is a smoker  Hx of gerd as well Disc diet

## 2012-09-01 NOTE — Patient Instructions (Addendum)
Tetanus shot and flu shot today We will schedule mammogram at check out  Pap smear today  Cholesterol is up a bit - Avoid red meat/ fried foods/ egg yolks/ fatty breakfast meats/ butter, cheese and high fat dairy/ and shellfish   We will do a referral to GI for the choking issue and also to discuss colon cancer screening  Start lisinopril 10 mg for high blood pressure in ams  Follow up in 4-6 weeks

## 2012-09-01 NOTE — Assessment & Plan Note (Addendum)
bp up today Not on medicine - likely needs to start Rev lifestyle change sched f/u for this and other issues- if not imp will disc tx

## 2012-09-01 NOTE — Progress Notes (Signed)
Subjective:    Patient ID: Amy Leonard, female    DOB: [birth year], 52 y.o.   MRN: [id number]  HPI Here for health maintenance exam and to review chronic medical problems    Is doing ok overall  Is having choking problems when she eats for 4-5 weeks , food gets stuck- some heartburn - will start zantac  Feet hurt and occ get numb , she has ingrown toenails    Mammogram-has been a while since last one -wants to go to the breast center Self exam-no lumps or changes   Pap 6/11- needs one  No abn paps  Has a new partner- is monogamous  Is having hot flashes , no peroid in over a year   Td 3/04-needs that  Flu- missed that , was out of town - will get one today  Pneumovax- will do that next time   Colon cancer screen- is interested in a colonoscopy   bp is up today  No cp or palpitations or headaches or edema   - and had a stressful am  No medicine BP Readings from Last 3 Encounters:  09/01/12 154/98  05/20/12 130/88  05/01/12 128/82     Hyperlipidemia On lipitor and diet  Lab Results  Component Value Date   CHOL 191 08/24/2012   CHOL 164 07/01/2012   CHOL 195 05/20/2012   Lab Results  Component Value Date   HDL 45.50 08/24/2012   HDL 49.80 07/01/2012   HDL 10.27 05/20/2012   Lab Results  Component Value Date   LDLCALC 120* 08/24/2012   LDLCALC 98 07/01/2012   LDLCALC 129* 05/20/2012   Lab Results  Component Value Date   TRIG 129.0 08/24/2012   TRIG 83.0 07/01/2012   TRIG 66.0 05/20/2012   Lab Results  Component Value Date   CHOLHDL 4 08/24/2012   CHOLHDL 3 07/01/2012   CHOLHDL 4 05/20/2012   Lab Results  Component Value Date   LDLDIRECT 130.3 01/26/2010     Lab Results  Component Value Date   WBC 9.0 08/24/2012   HGB 13.8 08/24/2012   HCT 40.7 08/24/2012   MCV 95.2 08/24/2012   PLT 233.0 08/24/2012     Wt is up 7 lb with bmi of 33   Smoking status - almost quit/ down to 2 cig per day- was with sisters who smoked when her mom died  Is ready to  quit again - has some chantix - will re start it , still has it    Patient Active Problem List  Diagnosis  . HERPES SIMPLEX INFECTION  . LACTOSE INTOLERANCE  . PURE HYPERCHOLESTEROLEMIA  . ANXIETY DEPRESSION  . TOBACCO USE  . MIGRAINE VARIANT  . CARPAL TUNNEL SYNDROME  . UNSPECIFIED GLAUCOMA  . ESSENTIAL HYPERTENSION  . VAGINITIS, BACTERIAL  . IRREGULAR MENSES  . SPINAL STENOSIS  . BACK PAIN, CHRONIC  . LEG PAIN, RIGHT  . Edema  . Rectal fissure  . Viral URI with cough  . Loss of balance  . Gait disorder  . History of falling  . Other screening mammogram  . Lacunar infarction  . Basal ganglia infarction  . Viral gastroenteritis  . Headache  . Routine general medical examination at a health care facility   Past Medical History  Diagnosis Date  . Elevated WBC count     nl diff  . Chronic back pain     spinal stenosis  . Tobacco abuse   . Anxiety and depression   . HSV  infection     recurrent (side and buttox)  . Migraine   . Glaucoma(365)     ?  Marland Kitchen Carpal tunnel syndrome   . HLD (hyperlipidemia)   . Folliculitis 05/05/2011  . Anxiety   . Depression   . 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 Btl   . Epidural steroid injection 2008   History  Substance Use Topics  . Smoking status: Current Every Day Smoker -- 0.5 packs/day    Types: Cigarettes  . Smokeless tobacco: Not on file     Comment: 1 1/2 ppd   . Alcohol Use: Yes     Comment: 4-5 beers fri and sat night; usu none during the week; drinking changes based on mood and anger level    Family History  Problem Relation Age of Onset  . Stroke      GM; DM  . Diabetes Mother   . Hypertension Mother   . Leukemia Mother   . Obesity Mother   . Depression Sister   . Bipolar disorder Son     Bipolar / oppositional defiant  . Alcohol abuse Sister   . Drug abuse Sister   . Drug abuse Brother   . Alcohol abuse Brother   . Alcohol abuse Sister   . Drug abuse Sister     . Colon cancer Neg Hx    Allergies  Allergen Reactions  . Atorvastatin     REACTION: constipation  . Bupropion Hcl     REACTION: GI, sleepy  . Hydrocod Polst-Cpm Polst Er     REACTION: vomiting  . Naproxen Sodium     REACTION: does not tolerate   Current Outpatient Prescriptions on File Prior to Visit  Medication Sig Dispense Refill  . acetaminophen (TYLENOL) 325 MG tablet Take 650 mg by mouth as needed.      Marland Kitchen acyclovir (ZOVIRAX) 5 % ointment Apply topically as needed.       Marland Kitchen aspirin 81 MG chewable tablet Chew 81 mg by mouth daily.      Marland Kitchen atorvastatin (LIPITOR) 10 MG tablet Take 1 tablet (10 mg total) by mouth daily.  30 tablet  5  . calcium carbonate (TUMS - DOSED IN MG ELEMENTAL CALCIUM) 500 MG chewable tablet Chew by mouth as directed.        . cyclobenzaprine (FLEXERIL) 10 MG tablet Take 10 mg by mouth as needed.       . traMADol (ULTRAM) 50 MG tablet Take 1 tablet (50 mg total) by mouth every 8 (eight) hours as needed.  30 tablet  0  . varenicline (CHANTIX STARTING MONTH PAK) 0.5 MG X 11 & 1 MG X 42 tablet Take one 0.5 mg tablet by mouth once daily for 3 days, then increase to one 0.5 mg tablet twice daily for 4 days, then increase to one 1 mg tablet twice daily.  53 tablet  0  . varenicline (CHANTIX) 0.5 MG tablet Take 1 tablet (0.5 mg total) by mouth 2 (two) times daily.  60 tablet  3     Review of Systems Review of Systems  Constitutional: Negative for fever, appetite change, fatigue and unexpected weight change.  Eyes: Negative for pain and visual disturbance.  Respiratory: Negative for cough and shortness of breath.   Cardiovascular: Negative for cp or palpitations    Gastrointestinal: Negative for nausea, diarrhea and constipation pos for dysphagia and acid reflux .  Genitourinary: Negative for urgency and frequency.  Skin:  Negative for pallor or rash   MSK pos for chronic back pain  Neurological: Negative for weakness, light-headedness, numbness and headaches.   Hematological: Negative for adenopathy. Does not bruise/bleed easily.  Psychiatric/Behavioral: Negative for dysphoric mood. The patient is not nervous/anxious.         Objective:   Physical Exam  Constitutional: She appears well-developed and well-nourished. No distress.       obese and well appearing   HENT:  Head: Normocephalic and atraumatic.  Right Ear: External ear normal.  Left Ear: External ear normal.  Nose: Nose normal.  Mouth/Throat: Oropharynx is clear and moist.  Eyes: Conjunctivae normal and EOM are normal. Pupils are equal, round, and reactive to light. Right eye exhibits no discharge. Left eye exhibits no discharge.  Neck: Normal range of motion. Neck supple. No JVD present. Carotid bruit is not present. No thyromegaly present.  Cardiovascular: Normal rate, regular rhythm, normal heart sounds and intact distal pulses.  Exam reveals no gallop.   Pulmonary/Chest: Effort normal and breath sounds normal. No respiratory distress. She has no wheezes.  Abdominal: Soft. Bowel sounds are normal. She exhibits no distension, no abdominal bruit and no mass. There is no tenderness.  Genitourinary: Vagina normal and uterus normal. No breast swelling, tenderness, discharge or bleeding. There is no rash or tenderness on the right labia. There is no rash or tenderness on the left labia. Uterus is not enlarged and not tender. Cervix exhibits no motion tenderness, no discharge and no friability. Right adnexum displays no mass, no tenderness and no fullness. Left adnexum displays no mass, no tenderness and no fullness. No bleeding around the vagina. No vaginal discharge found.       Breast exam: No mass, nodules, thickening, tenderness, bulging, retraction, inflamation, nipple discharge or skin changes noted.  No axillary or clavicular LA.  Chaperoned exam.    Musculoskeletal: She exhibits no edema and no tenderness.  Lymphadenopathy:    She has no cervical adenopathy.  Neurological: She is  alert. She has normal reflexes. No cranial nerve deficit. She exhibits normal muscle tone. Coordination normal.  Skin: Skin is warm and dry. No rash noted. No erythema. No pallor.  Psychiatric: She has a normal mood and affect.          Assessment & Plan:

## 2012-09-03 NOTE — Assessment & Plan Note (Signed)
Annual exam with pap

## 2012-09-03 NOTE — Assessment & Plan Note (Signed)
Disc in detail risks of smoking and possible outcomes including copd, vascular/ heart disease, cancer , respiratory and sinus infections  Pt voices understanding  

## 2012-09-03 NOTE — Assessment & Plan Note (Signed)
Scheduled annual screening mammogram Nl breast exam today  Encouraged monthly self exams   

## 2012-09-03 NOTE — Assessment & Plan Note (Signed)
Ref to GI to disc this and her dysphagia

## 2012-09-03 NOTE — Assessment & Plan Note (Signed)
Reviewed health habits including diet and exercise and skin cancer prevention Also reviewed health mt list, fam hx and immunizations  Disc need for wt loss and smoking cessation Rev wellness labs

## 2012-09-04 ENCOUNTER — Encounter: Payer: Self-pay | Admitting: *Deleted

## 2012-09-14 ENCOUNTER — Ambulatory Visit: Payer: BC Managed Care – PPO | Admitting: Gastroenterology

## 2012-09-17 ENCOUNTER — Telehealth: Payer: Self-pay

## 2012-09-17 NOTE — Telephone Encounter (Signed)
I do not think it is from the tetanus shot - unsure what it could be unless evaluated (tendonitis is common, though) I recommend she makes an appt with Dr Patsy Lager for eval

## 2012-09-17 NOTE — Telephone Encounter (Signed)
Appt scheduled with Dr. Patsy Lager for 09/23/12

## 2012-09-17 NOTE — Telephone Encounter (Signed)
Pt said got tetanus shot in rt arm on 09/01/12; since got shot has had pain below rt elbow; not where she got tetanus injection. Pt wanted to know if this could be from tetanus shot or what does Dr Milinda Antis think causing pain.Please advise.

## 2012-09-23 ENCOUNTER — Ambulatory Visit (INDEPENDENT_AMBULATORY_CARE_PROVIDER_SITE_OTHER): Payer: BC Managed Care – PPO | Admitting: Family Medicine

## 2012-09-23 ENCOUNTER — Encounter: Payer: Self-pay | Admitting: Family Medicine

## 2012-09-23 VITALS — BP 120/84 | HR 79 | Temp 97.8°F | Ht 63.0 in | Wt 185.8 lb

## 2012-09-23 DIAGNOSIS — M7710 Lateral epicondylitis, unspecified elbow: Secondary | ICD-10-CM

## 2012-09-23 NOTE — Progress Notes (Signed)
Nature conservation officer at [facility] [address] [place] [address] Phone: [phone] Fax: [phone]  Date:  09/23/2012   Name:  Amy Leonard   DOB:  [birth year]   MRN:  [id number] Gender: female Age: 52 y.o.  PCP:  [name], MD  Evaluating MD: Hannah Beat, MD  Patient presents with R lateral elbow pain.  Length of symptoms: 4-6 weeks Hand effected:  Patient describes a dull ache on the lateral elbow. There is some translation in the proximal forearm and in the distal upper arm. It is painful to lift with the hand facing down and to lift with the thumb in an upright position. Supination is painful. Patient points to the lateral epicondyle as the point of maximal tenderness near ECRB. R elbow -- has been hurting for more than a month. Mixes paint all day.   No trauma.   No prior fractures or operative interventions in the effective hand. Prior PT or HEP: none  Denies numbness or tingling. No significant neck or shoulder pain.  Hand of dominance: R  Patient Active Problem List  Diagnosis  . HERPES SIMPLEX INFECTION  . LACTOSE INTOLERANCE  . PURE HYPERCHOLESTEROLEMIA  . ANXIETY DEPRESSION  . TOBACCO USE  . MIGRAINE VARIANT  . CARPAL TUNNEL SYNDROME  . UNSPECIFIED GLAUCOMA  . ESSENTIAL HYPERTENSION  . SPINAL STENOSIS  . BACK PAIN, CHRONIC  . Edema  . History of falling  . Other screening mammogram  . Lacunar infarction  . Basal ganglia infarction  . Routine general medical examination at a health care facility  . Routine gynecological examination  . Dysphagia  . Colon cancer screening    Past Medical History  Diagnosis Date  . Elevated WBC count     nl diff  . Chronic back pain     spinal stenosis  . Tobacco abuse   . Anxiety and depression   . HSV infection     recurrent (side and buttox)  . Migraine   . Glaucoma(365)     ?  Marland Kitchen Carpal tunnel syndrome   . HLD (hyperlipidemia)   . Folliculitis 05/05/2011  . Anxiety   . Depression     . 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 Btl   . Epidural steroid injection 2008    History  Substance Use Topics  . Smoking status: Current Every Day Smoker -- 0.5 packs/day    Types: Cigarettes  . Smokeless tobacco: Not on file     Comment: 1 1/2 ppd   . Alcohol Use: Yes     Comment: 4-5 beers fri and sat night; usu none during the week; drinking changes based on mood and anger level     Family History  Problem Relation Age of Onset  . Stroke      GM; DM  . Diabetes Mother   . Hypertension Mother   . Leukemia Mother   . Obesity Mother   . Depression Sister   . Bipolar disorder Son     Bipolar / oppositional defiant  . Alcohol abuse Sister   . Drug abuse Sister   . Drug abuse Brother   . Alcohol abuse Brother   . Alcohol abuse Sister   . Drug abuse Sister   . Colon cancer Neg Hx     Allergies  Allergen Reactions  . Atorvastatin     REACTION: constipation  . Bupropion Hcl     REACTION: GI, sleepy  .  Hydrocod Polst-Cpm Polst Er     REACTION: vomiting  . Naproxen Sodium     REACTION: does not tolerate    Current Outpatient Prescriptions on File Prior to Visit  Medication Sig Dispense Refill  . Acetaminophen (TYLENOL ARTHRITIS PAIN PO) Take 2 tablets by mouth as needed.      Marland Kitchen acetaminophen (TYLENOL) 325 MG tablet Take 650 mg by mouth as needed.      Marland Kitchen acyclovir (ZOVIRAX) 5 % ointment Apply topically as needed.       Marland Kitchen aspirin 81 MG chewable tablet Chew 81 mg by mouth daily.      Marland Kitchen atorvastatin (LIPITOR) 10 MG tablet Take 1 tablet (10 mg total) by mouth daily.  30 tablet  5  . calcium carbonate (TUMS - DOSED IN MG ELEMENTAL CALCIUM) 500 MG chewable tablet Chew by mouth as directed.        . cyclobenzaprine (FLEXERIL) 10 MG tablet Take 10 mg by mouth as needed.       Marland Kitchen lisinopril (PRINIVIL,ZESTRIL) 10 MG tablet Take 1 tablet (10 mg total) by mouth daily.  30 tablet  11  . traMADol (ULTRAM) 50 MG tablet Take 1 tablet  (50 mg total) by mouth every 8 (eight) hours as needed.  30 tablet  0  . varenicline (CHANTIX) 0.5 MG tablet Take 1 tablet (0.5 mg total) by mouth 2 (two) times daily.  60 tablet  3     REVIEW OF SYSTEMS  GEN: No fevers, chills. Nontoxic. Primarily MSK c/o today. MSK: Detailed in the HPI GI: tolerating PO intake without difficulty Neuro: No numbness, parasthesias, or tingling associated. Otherwise the pertinent positives of the ROS are noted above.   PHYSICAL EXAM  Blood pressure 120/84, pulse 79, temperature 97.8 F (36.6 C), temperature source Oral, height 5\' 3"  (1.6 m), weight 185 lb 12 oz (84.256 kg), SpO2 97.00%.  GEN: Well-developed,well-nourished,in no acute distress; alert,appropriate and cooperative throughout examination HEENT: Normocephalic and atraumatic without obvious abnormalities. Ears, externally no deformities PULM: Breathing comfortably in no respiratory distress EXT: No clubbing, cyanosis, or edema PSYCH: Normally interactive. Cooperative during the interview. Pleasant. Friendly and conversant. Not anxious or depressed appearing. Normal, full affect.  R elbow Ecchymosis or edema: neg ROM: full flexion, extension, pronation, supination Shoulder ROM: Full Flexion: 5/5 Extension: 5/5, PAINFUL Supination: 5/5, PAINFUL Pronation: 5/5 Wrist ext: 5/5 Wrist flexion: 5/5 No gross bony abnormality Varus and Valgus stress: stable ECRB tenderness: YES, TTP Medial epicondyle: NT Lateral epicondyle, resisted wrist extension from wrist full pronation and flexion: PAINFUL grip: 5/5  sensation intact Tinel's, Elbow: negative  A/P:  1. Lateral epicondylitis  of elbow     Elbow anatomy was reviewed, and tendinopathy was explained.  Pt. given a formal rehab program from [facility].  Series of concentric and eccentric exercises should be done starting with no weight, work up to 1 lb, hammer, etc.  Use counterforce strap if working or using hands. Emphasized  stretching an cross-friction massage Emphasized proper palms up lifting biomechanics to unload ECRB   With extent of sx and occupation, inject now  Lateral Epicondylitis Injection, R Verbal consent was obtained from the patient. Risks, benefits, and alternatives were discussed. Potential complications including loss of pigment, atrophy, and rare risk of infection were discussed. Prepped with Chloraprep and Ethyl Chloride used for anesthesia. Under sterile conditions, the patient was injected at the point of maximal tenderness at the ECRB tendon with 2 cc of Lidocaine 1% and 1 cc of Depo-Medrol 40 mg.  Decreased pain after injection. No complications.  Needle size: 22 gauge 1 1/2 inch

## 2012-09-24 ENCOUNTER — Ambulatory Visit
Admission: RE | Admit: 2012-09-24 | Discharge: 2012-09-24 | Disposition: A | Discharge status: Home / Self Care | Payer: BC Managed Care – PPO | Source: Ambulatory Visit | Attending: Family Medicine | Admitting: Family Medicine

## 2012-09-24 DIAGNOSIS — Z1231 Encounter for screening mammogram for malignant neoplasm of breast: Secondary | ICD-10-CM

## 2012-09-29 ENCOUNTER — Other Ambulatory Visit: Payer: Self-pay | Admitting: Family Medicine

## 2012-09-29 ENCOUNTER — Ambulatory Visit (INDEPENDENT_AMBULATORY_CARE_PROVIDER_SITE_OTHER): Payer: BC Managed Care – PPO | Admitting: Family Medicine

## 2012-09-29 ENCOUNTER — Encounter: Payer: Self-pay | Admitting: Family Medicine

## 2012-09-29 ENCOUNTER — Other Ambulatory Visit: Payer: Self-pay

## 2012-09-29 VITALS — BP 132/82 | HR 81 | Temp 98.6°F | Ht 63.0 in | Wt 183.5 lb

## 2012-09-29 DIAGNOSIS — F17200 Nicotine dependence, unspecified, uncomplicated: Secondary | ICD-10-CM

## 2012-09-29 DIAGNOSIS — I10 Essential (primary) hypertension: Secondary | ICD-10-CM

## 2012-09-29 DIAGNOSIS — R928 Other abnormal and inconclusive findings on diagnostic imaging of breast: Secondary | ICD-10-CM

## 2012-09-29 MED ORDER — ATORVASTATIN CALCIUM 10 MG PO TABS: 10.0000 mg | ORAL_TABLET | Freq: Every day | ORAL | Status: DC

## 2012-09-29 MED ORDER — LISINOPRIL 10 MG PO TABS: 10.0000 mg | ORAL_TABLET | Freq: Every day | ORAL | Status: DC

## 2012-09-29 NOTE — Assessment & Plan Note (Signed)
Densities seen bilat-ordered repeat views (dx) -poss [place]

## 2012-09-29 NOTE — Assessment & Plan Note (Signed)
bp is better today with better lifestyle habits and beginning of wt loss Disc lifestyle habits Today is smoking quit day also-commended! Will continue to follow

## 2012-09-29 NOTE — Patient Instructions (Addendum)
I'm glad bp is good today  Keep taking care of yourself and loosing weight  Find some time to exercise  Make today your quit day- I'm thrilled with that  We will schedule mammogram at check out  See GI as planned

## 2012-09-29 NOTE — Assessment & Plan Note (Signed)
Disc in detail risks of smoking and possible outcomes including copd, vascular/ heart disease, cancer , respiratory and sinus infections  Pt voices understanding  Today is quit day-- pt is motivated and on chantix Wishing her luck!

## 2012-09-29 NOTE — Progress Notes (Signed)
Subjective:    Patient ID: Amy Leonard, female    DOB: 1961-06-19, 52 y.o.   MRN: [id number]  HPI Here for f/u of elevated bp  Was up last time - first check  Today 132/82 -on first check today   Is trying to take care of herself  Is starting to loose some weight - is cutting portions and eating earlier   No extra exercise  except work - and has a chronic back problem  Work hours are difficult   Will be seeing GI on the 12 th -- Dr Russella Dar - for swallowing issue and for colonosc discussion Dysphagia is improved some with smaller portions   Still smoking -decided to make tomorrow her quit date -is on her chantix (her mind is set)   Needs her f/u mam scheduled   Patient Active Problem List  Diagnosis  . HERPES SIMPLEX INFECTION  . LACTOSE INTOLERANCE  . PURE HYPERCHOLESTEROLEMIA  . ANXIETY DEPRESSION  . TOBACCO USE  . MIGRAINE VARIANT  . CARPAL TUNNEL SYNDROME  . UNSPECIFIED GLAUCOMA  . ESSENTIAL HYPERTENSION  . SPINAL STENOSIS  . BACK PAIN, CHRONIC  . Edema  . History of falling  . Other screening mammogram  . Lacunar infarction  . Basal ganglia infarction  . Routine general medical examination at a health care facility  . Routine gynecological examination  . Dysphagia  . Colon cancer screening  . Abnormal mammogram   Past Medical History  Diagnosis Date  . Elevated WBC count     nl diff  . Chronic back pain     spinal stenosis  . Tobacco abuse   . Anxiety and depression   . HSV infection     recurrent (side and buttox)  . Migraine   . Glaucoma(365)     ?  Marland Kitchen Carpal tunnel syndrome   . HLD (hyperlipidemia)   . Folliculitis 05/05/2011  . Anxiety   . Depression   . 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 Btl   . Epidural steroid injection 2008   History  Substance Use Topics  . Smoking status: Current Every Day Smoker -- 0.5 packs/day    Types: Cigarettes  . Smokeless tobacco: Not on file     Comment: 1 1/2  ppd   . Alcohol Use: Yes     Comment: 4-5 beers fri and sat night;rare   Family History  Problem Relation Age of Onset  . Stroke      GM; DM  . Diabetes Mother   . Hypertension Mother   . Leukemia Mother   . Obesity Mother   . Depression Sister   . Bipolar disorder Son     Bipolar / oppositional defiant  . Alcohol abuse Sister   . Drug abuse Sister   . Drug abuse Brother   . Alcohol abuse Brother   . Alcohol abuse Sister   . Drug abuse Sister   . Colon cancer Neg Hx    Allergies  Allergen Reactions  . Atorvastatin     REACTION: constipation  . Bupropion Hcl     REACTION: GI, sleepy  . Hydrocod Polst-Cpm Polst Er     REACTION: vomiting  . Naproxen Sodium     REACTION: does not tolerate   Current Outpatient Prescriptions on File Prior to Visit  Medication Sig Dispense Refill  . Acetaminophen (TYLENOL ARTHRITIS PAIN PO) Take 2 tablets by mouth as needed.      Marland Kitchen  acetaminophen (TYLENOL) 325 MG tablet Take 650 mg by mouth as needed.      Marland Kitchen acyclovir (ZOVIRAX) 5 % ointment Apply topically as needed.       Marland Kitchen aspirin 81 MG chewable tablet Chew 81 mg by mouth daily.      . calcium carbonate (TUMS - DOSED IN MG ELEMENTAL CALCIUM) 500 MG chewable tablet Chew by mouth as directed.        . cyclobenzaprine (FLEXERIL) 10 MG tablet Take 10 mg by mouth as needed.       . traMADol (ULTRAM) 50 MG tablet Take 1 tablet (50 mg total) by mouth every 8 (eight) hours as needed.  30 tablet  0  . varenicline (CHANTIX) 0.5 MG tablet Take 1 tablet (0.5 mg total) by mouth 2 (two) times daily.  60 tablet  3  . atorvastatin (LIPITOR) 10 MG tablet Take 1 tablet (10 mg total) by mouth daily.  90 tablet  1  . lisinopril (PRINIVIL,ZESTRIL) 10 MG tablet Take 1 tablet (10 mg total) by mouth daily.  90 tablet  3     Review of Systems Review of Systems  Constitutional: Negative for fever, appetite change, fatigue and unexpected weight change.  Eyes: Negative for pain and visual disturbance.  Respiratory:  Negative for cough and shortness of breath.   Cardiovascular: Negative for cp or palpitations    Gastrointestinal: Negative for nausea, diarrhea and constipation pos for dysphagia that is improving , neg for blood in stool.  Genitourinary: Negative for urgency and frequency.  Skin: Negative for pallor or rash   MSK pos for chronic back pain that limits her activities  Neurological: Negative for weakness, light-headedness, numbness and headaches.  Hematological: Negative for adenopathy. Does not bruise/bleed easily.  Psychiatric/Behavioral: Negative for dysphoric mood. The patient is not nervous/anxious.         Objective:   Physical Exam  Constitutional: She appears well-developed and well-nourished. No distress.  HENT:  Head: Normocephalic and atraumatic.  Mouth/Throat: Oropharynx is clear and moist.  Eyes: Conjunctivae normal and EOM are normal. Pupils are equal, round, and reactive to light. Right eye exhibits no discharge. Left eye exhibits no discharge. No scleral icterus.  Neck: Normal range of motion. Neck supple. No JVD present. Carotid bruit is not present. No thyromegaly present.  Cardiovascular: Normal rate, regular rhythm, normal heart sounds and intact distal pulses.  Exam reveals no gallop.   Pulmonary/Chest: Effort normal and breath sounds normal. No respiratory distress. She has no wheezes.  Abdominal: Soft. Bowel sounds are normal. She exhibits no distension, no abdominal bruit and no mass. There is no tenderness.  Musculoskeletal: She exhibits no tenderness.       Poor rom TS and LS   Lymphadenopathy:    She has no cervical adenopathy.  Neurological: She is alert. She has normal reflexes. No cranial nerve deficit. She exhibits normal muscle tone. Coordination normal.  Skin: Skin is warm and dry. No rash noted. No erythema. No pallor.  Psychiatric: She has a normal mood and affect.          Assessment & Plan:

## 2012-09-29 NOTE — Telephone Encounter (Signed)
Pt said insurance requires 3 month supply of med sent to [facility]. Pt advised done for atorvastatin and lisinopril.

## 2012-10-07 ENCOUNTER — Encounter: Payer: Self-pay | Admitting: Gastroenterology

## 2012-10-07 ENCOUNTER — Other Ambulatory Visit (HOSPITAL_COMMUNITY): Payer: Self-pay | Admitting: Gastroenterology

## 2012-10-07 ENCOUNTER — Ambulatory Visit (INDEPENDENT_AMBULATORY_CARE_PROVIDER_SITE_OTHER): Payer: BC Managed Care – PPO | Admitting: Gastroenterology

## 2012-10-07 VITALS — BP 140/82 | HR 104 | Ht 63.0 in | Wt 186.8 lb

## 2012-10-07 DIAGNOSIS — R1319 Other dysphagia: Secondary | ICD-10-CM

## 2012-10-07 DIAGNOSIS — K219 Gastro-esophageal reflux disease without esophagitis: Secondary | ICD-10-CM

## 2012-10-07 DIAGNOSIS — R059 Cough, unspecified: Secondary | ICD-10-CM

## 2012-10-07 DIAGNOSIS — Z1211 Encounter for screening for malignant neoplasm of colon: Secondary | ICD-10-CM

## 2012-10-07 DIAGNOSIS — R1310 Dysphagia, unspecified: Secondary | ICD-10-CM

## 2012-10-07 DIAGNOSIS — R05 Cough: Secondary | ICD-10-CM

## 2012-10-07 MED ORDER — OMEPRAZOLE 20 MG PO CPDR: 20.0000 mg | DELAYED_RELEASE_CAPSULE | Freq: Every day | ORAL | Status: DC

## 2012-10-07 MED ORDER — PEG-KCL-NACL-NASULF-NA ASC-C 100 G PO SOLR: 1.0000 | Freq: Once | ORAL | Status: DC

## 2012-10-07 NOTE — Patient Instructions (Addendum)
You have been given a separate informational sheet regarding your tobacco use, the importance of quitting and local resources to help you quit.  We have sent the following medications to your pharmacy for you to pick up at your convenience:Omeprazole.  Patient advised to avoid spicy, acidic, citrus, chocolate, mints, fruit and fruit juices.  Limit the intake of caffeine, alcohol and Soda.  Don't exercise too soon after eating.  Don't lie down within 3-4 hours of eating.  Elevate the head of your bed.  Please contact your Primary Care Physician about stopping your lisinopril. It might be causing some of your symptoms.   You have been scheduled for an endoscopy and colonoscopy with propofol. Please follow the written instructions given to you at your visit today. Please pick up your prep at the pharmacy within the next 1-3 days. If you use inhalers (even only as needed) or a CPAP machine, please bring them with you on the day of your procedure.   You have been scheduled for a Barium Esophogram and Modified Barium Swallow at [facility] (1st floor admitting of the hospital) on 10/14/12 at 1:00pm. Please arrive 15 minutes prior to your appointment for registration. Make certain not to have anything to eat or drink 6 hours prior to your test. If you need to reschedule for any reason, please contact radiology at [phone] to do so. __________________________________________________________________ A barium swallow is an examination that concentrates on views of the esophagus. This tends to be a double contrast exam (barium and two liquids which, when combined, create a gas to distend the wall of the oesophagus) or single contrast (non-ionic iodine based). The study is usually tailored to your symptoms so a good history is essential. Attention is paid during the study to the form, structure and configuration of the esophagus, looking for functional disorders (such as aspiration, dysphagia,  achalasia, motility and reflux) EXAMINATION You may be asked to change into a gown, depending on the type of swallow being performed. A radiologist and radiographer will perform the procedure. The radiologist will advise you of the type of contrast selected for your procedure and direct you during the exam. You will be asked to stand, sit or lie in several different positions and to hold a small amount of fluid in your mouth before being asked to swallow while the imaging is performed .In some instances you may be asked to swallow barium coated marshmallows to assess the motility of a solid food bolus. The exam can be recorded as a digital or video fluoroscopy procedure. POST PROCEDURE It will take 1-2 days for the barium to pass through your system. To facilitate this, it is important, unless otherwise directed, to increase your fluids for the next 24-48hrs and to resume your normal diet.  This test typically takes about 30 minutes to perform. __________________________________________________________________________________ Thank you for choosing me and [facility].  Venita Lick. Pleas Koch., MD., Clementeen Graham

## 2012-10-07 NOTE — Progress Notes (Signed)
History of Present Illness: This is a 52 year old female who relates choking and coughing when swallowing for the past 2-3 months. She states she has coughing spells at other times in her coughing spells will often last for quite some time. She generally notes no difficulties with liquids but often has difficulty with any type of solid food. She also has had frequent reflux symptoms for years. Most recently she has tried over-the-counter Zantac which has been helpful. She states she began lisinopril a few months ago. Denies weight loss, abdominal pain, constipation, diarrhea, change in stool caliber, melena, hematochezia, nausea, vomiting, chest pain.  Review of Systems: Pertinent positive and negative review of systems were noted in the above HPI section. All other review of systems were otherwise negative.  Current Medications, Allergies, Past Medical History, Past Surgical History, Family History and Social History were reviewed in [employer] record.  Physical Exam: General: Well developed , well nourished, no acute distress Head: Normocephalic and atraumatic Eyes:  sclerae anicteric, EOMI Ears: Normal auditory acuity Mouth: No deformity or lesions Neck: Supple, no masses or thyromegaly Lungs: Clear throughout to auscultation Heart: Regular rate and rhythm; no murmurs, rubs or bruits Abdomen: Soft, non tender and non distended. No masses, hepatosplenomegaly or hernias noted. Normal Bowel sounds Rectal: Deferred to colonoscopy Musculoskeletal: Symmetrical with no gross deformities  Skin: No lesions on visible extremities Pulses:  Normal pulses noted Extremities: No clubbing, cyanosis, edema or deformities noted Neurological: Alert oriented x 4, grossly nonfocal Cervical Nodes:  No significant cervical adenopathy Inguinal Nodes: No significant inguinal adenopathy Psychological:  Alert and cooperative. Normal mood and affect  Assessment and Recommendations:  1.  Dysphagia. Her symptoms are mixed with ohe component being suggestive of an oropharyngeal etiology and another component suggestive of an esophageal etiology. Schedule modified barium swallow study and barium esophagram. Schedule upper endoscopy. The risks, benefits, and alternatives to endoscopy with possible biopsy and possible dilation were discussed with the patient and they consent to proceed.   2. GERD. Begin omeprazole 20 mg daily and standard antireflux measures. Upper endoscopy as above.  3. Colorectal cancer screening, average risk. Schedule colonoscopy. The risks, benefits, and alternatives to colonoscopy with possible biopsy and possible polypectomy were discussed with the patient and they consent to proceed.   4. Frequent cough. Rule out Lisinopril side effects. She is advised to contact her primary physician.

## 2012-10-09 ENCOUNTER — Other Ambulatory Visit: Payer: BC Managed Care – PPO

## 2012-10-12 ENCOUNTER — Encounter: Payer: Self-pay | Admitting: Gastroenterology

## 2012-10-14 ENCOUNTER — Ambulatory Visit (HOSPITAL_COMMUNITY)
Admission: RE | Admit: 2012-10-14 | Discharge: 2012-10-14 | Disposition: A | Discharge status: Home / Self Care | Payer: BC Managed Care – PPO | Source: Ambulatory Visit | Attending: Gastroenterology | Admitting: Gastroenterology

## 2012-10-14 ENCOUNTER — Telehealth: Payer: Self-pay

## 2012-10-14 DIAGNOSIS — R059 Cough, unspecified: Secondary | ICD-10-CM

## 2012-10-14 DIAGNOSIS — R1319 Other dysphagia: Secondary | ICD-10-CM | POA: Insufficient documentation

## 2012-10-14 DIAGNOSIS — R05 Cough: Secondary | ICD-10-CM | POA: Insufficient documentation

## 2012-10-14 DIAGNOSIS — K219 Gastro-esophageal reflux disease without esophagitis: Secondary | ICD-10-CM | POA: Insufficient documentation

## 2012-10-14 DIAGNOSIS — R1310 Dysphagia, unspecified: Secondary | ICD-10-CM

## 2012-10-14 MED ORDER — LOSARTAN POTASSIUM 50 MG PO TABS: 50.0000 mg | ORAL_TABLET | Freq: Every day | ORAL | Status: DC

## 2012-10-14 NOTE — Telephone Encounter (Signed)
Ok- please change to losartan Let me know if any problems  Px written for call in

## 2012-10-14 NOTE — Telephone Encounter (Signed)
Before we do that -I would like her to hold it for 1 week to see if the cramps stop and let me know - I want to make sure it is that  thanks

## 2012-10-14 NOTE — Telephone Encounter (Signed)
Pt left v/m requesting substitution of med for lisinopril due to leg cramps. [facility].Please advise.

## 2012-10-14 NOTE — Procedures (Signed)
Objective Swallowing Evaluation: Modified Barium Swallowing Study  Patient Details  Name: Amy Leonard MRN: [id number] Date of Birth: 14-Sep-1960  Today's Date: 10/14/2012 Time: 1310-1335 SLP Time Calculation (min): 25 min  Past Medical History:  Past Medical History  Diagnosis Date  . Elevated WBC count     nl diff  . Chronic back pain     spinal stenosis  . Tobacco abuse   . Anxiety and depression   . HSV infection     recurrent (side and buttox)  . Migraine   . Glaucoma(365)     ?  Marland Kitchen Carpal tunnel syndrome   . HLD (hyperlipidemia)   . Folliculitis 05/05/2011  . Anxiety   . Depression   . 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 Btl    . Epidural steroid injection  2008   HPI:  52 yo female referred by Dr Russella Dar for [facility] - followed by esophagram.  Pt PMH + for HTN, GERD and  "mini stroke" approx one year ago per pt. Pt medication list includes Chantix, ASA 81 mg, Atorvastatin, and Lisinopril.  Pt admits to stopping Lisinopril a few days ago due to cramping.  She also has not started taking her omeprazole - though she filled the prescription. Dysphagia x 4 months with progression characterized by coughing on foods reported.   Pt states this occurs with nearly every meal and at times will be intense enough to require back tapping to clear.  She further states she has tried to eat small bites/ chew well/ drink liquids and this has not stopped her symptoms.  Pt has taken Rolaids prn in the past for reflux but when they stopped working she started Zantac prn.  She does smoke 1/2 PPD currently, previously 1 PPD, started smoking at age 82 and is taking Chantix to attempt to cease smoking.       Assessment / Plan / Recommendation Clinical Impression  Dysphagia Diagnosis: Within Functional Limits;Suspected primary esophageal dysphagia Clinical impression: Pt presents with functional oropharyngeal swallow ability.   No aspiration or deep laryngeal penetration observed, swallow was timely with only trace vallecular stasis of liquid.  Pt did sense "agitation" in pharynx after swallowing pudding and stated she "felt she needed to swallow again."  Pharynx was clear and pt was instructed to finding.  Pt appeared with mildly delayed clearance at distal esophagus after several boluses of liquids - to which she sensed and stated she would drink milk at home to clear.  Radiologist not present for MBS but pt is to have esophagram following this test, dysphagia symptoms are not due to oropharyngeal deficits.  Appreciate this referral.      Treatment Recommendation    n/a   Diet Recommendation Regular;Thin liquid   Liquid Administration via: Cup;Straw Medication Administration: Whole meds with liquid Supervision: Patient able to self feed Compensations: Follow solids with liquid;Slow rate;Small sips/bites (pt drinks liquids throughout meal to help w/symptoms per pt) Postural Changes and/or Swallow Maneuvers: Seated upright 90 degrees;Upright 30-60 min after meal    Other  Recommendations     Follow Up Recommendations  None           SLP Swallow Goals     General Date of Onset: 10/14/12 HPI: 52 yo female referred by Dr Russella Dar for [facility] - followed by esophagram.  Pt PMH + for HTN, GERD and  "mini stroke" approx one year ago per pt. Pt medication  list includes Chantix, ASA 81 mg, Atorvastatin, and Lisinopril.  Pt admits to stopping Lisinopril a few days ago due to cramping.  She also has not started taking her omeprazole - though she filled the prescription. Dysphagia x 4 months with progression characterized by coughing on foods reported.   Pt states this occurs with nearly every meal and at times will be intense enough to require back tapping to clear.  She further states she has tried to eat small bites/ chew well/ drink liquids and this has not stopped her symptoms.  Pt has taken Rolaids prn in the past for reflux  but when they stopped working she started Zantac prn.  She does smoke 1/2 PPD currently, previously 1 PPD, started smoking at age 32 and is taking Chantix to attempt to cease smoking.   Type of Study: Modified Barium Swallowing Study Reason for Referral: Objectively evaluate swallowing function Previous Swallow Assessment: none Diet Prior to this Study: Regular;Thin liquids Temperature Spikes Noted: No Respiratory Status: Room air History of Recent Intubation: No Behavior/Cognition: Alert;Cooperative Oral Cavity - Dentition: Adequate natural dentition;Dentures, top Oral Motor / Sensory Function: Within functional limits Self-Feeding Abilities: Able to feed self Patient Positioning: Upright in chair Baseline Vocal Quality: Clear Volitional Cough: Strong Volitional Swallow: Able to elicit Anatomy: Within functional limits Pharyngeal Secretions: Not observed secondary MBS    Reason for Referral Objectively evaluate swallowing function   Oral Phase Oral Preparation/Oral Phase Oral Phase: WFL Oral - Nectar Oral - Nectar Cup: Within functional limits Oral - Thin Oral - Thin Cup: Within functional limits Oral - Thin Straw: Within functional limits Oral - Solids Oral - Puree: Within functional limits Oral - Regular: Within functional limits   Pharyngeal Phase Pharyngeal Phase Pharyngeal Phase: Within functional limits Pharyngeal - Nectar Pharyngeal - Nectar Cup: Within functional limits Pharyngeal - Thin Pharyngeal - Thin Cup: Pharyngeal residue - valleculae (trace vallecular stasis, functional) Pharyngeal - Thin Straw: Pharyngeal residue - valleculae (trace vallecular residuals, functional) Pharyngeal - Solids Pharyngeal - Puree: Within functional limits Pharyngeal - Regular: Within functional limits  Cervical Esophageal Phase    GO    Cervical Esophageal Phase Cervical Esophageal Phase: Impaired (appearance of mild slow clearance distal, pt pending esophag) Cervical  Esophageal Phase - Nectar Nectar Cup: Within functional limits Cervical Esophageal Phase - Thin Thin Cup: Within functional limits Cervical Esophageal Phase - Solids Puree: Within functional limits Regular: Within functional limits    Functional Assessment Tool Used: mbs, clinical judgement Functional Limitations: Swallowing Swallow Current Status (Z6109): At least 1 percent but less than 20 percent impaired, limited or restricted Swallow Goal Status [phone]): At least 1 percent but less than 20 percent impaired, limited or restricted Swallow Discharge Status [id number]): At least 1 percent but less than 20 percent impaired, limited or restricted    Donavan Burnet, MS [facility] SLP [phone]

## 2012-10-14 NOTE — Telephone Encounter (Signed)
Pt said she has been off of the lisinopril for over a week when she started having leg cramps she stop taking it to see if it was the lisinopril because she has been on the lisinopril for a while and she only developed leg cramps this past month, but once pt stop taking the lisinopril the leg cramps stopped

## 2012-10-15 MED ORDER — LOSARTAN POTASSIUM 50 MG PO TABS: 50.0000 mg | ORAL_TABLET | Freq: Every day | ORAL | Status: DC

## 2012-10-15 NOTE — Telephone Encounter (Signed)
Left voicemail letting pt know Rx was changed and to let us know if she has any problems with new med, Rx sent to pharmacy

## 2012-10-20 ENCOUNTER — Ambulatory Visit (AMBULATORY_SURGERY_CENTER): Payer: BC Managed Care – PPO | Admitting: Gastroenterology

## 2012-10-20 ENCOUNTER — Encounter: Payer: Self-pay | Admitting: Gastroenterology

## 2012-10-20 VITALS — BP 149/93 | HR 88 | Temp 97.6°F | Resp 21 | Ht 63.0 in | Wt 186.0 lb

## 2012-10-20 DIAGNOSIS — K2100 Gastro-esophageal reflux disease with esophagitis, without bleeding: Secondary | ICD-10-CM

## 2012-10-20 DIAGNOSIS — Z1211 Encounter for screening for malignant neoplasm of colon: Secondary | ICD-10-CM

## 2012-10-20 DIAGNOSIS — R1310 Dysphagia, unspecified: Secondary | ICD-10-CM

## 2012-10-20 DIAGNOSIS — R1319 Other dysphagia: Secondary | ICD-10-CM

## 2012-10-20 DIAGNOSIS — D126 Benign neoplasm of colon, unspecified: Secondary | ICD-10-CM

## 2012-10-20 DIAGNOSIS — K2270 Barrett's esophagus without dysplasia: Secondary | ICD-10-CM

## 2012-10-20 MED ORDER — SODIUM CHLORIDE 0.9 % IV SOLN: 500.0000 mL | INTRAVENOUS | Status: DC

## 2012-10-20 MED ORDER — OMEPRAZOLE 20 MG PO CPDR: 40.0000 mg | DELAYED_RELEASE_CAPSULE | Freq: Two times a day (BID) | ORAL | Status: DC

## 2012-10-20 NOTE — Progress Notes (Signed)
Patient did not experience any of the following events: a burn prior to discharge; a fall within the facility; wrong site/side/patient/procedure/implant event; or a hospital transfer or hospital admission upon discharge from the facility. (G8907) Patient did not have preoperative order for IV antibiotic SSI prophylaxis. (G8918)  

## 2012-10-20 NOTE — Patient Instructions (Signed)

## 2012-10-20 NOTE — Op Note (Signed)
[facility] [address] [place], [address]   ENDOSCOPY PROCEDURE REPORT  PATIENT: Amy Leonard, Amy Leonard.  MR#: [id number] BIRTHDATE: 1960-10-27 , 52  yrs. old GENDER: Female ENDOSCOPIST: Meryl Dare, MD, [facility] REFERRED BY:  Judy Pimple, M.D. PROCEDURE DATE:  10/20/2012 PROCEDURE:  EGD w/ biopsy ASA CLASS:     Class II INDICATIONS:  Dysphagia. MEDICATIONS: residual sedation effect present from prior procedure, MAC sedation, administered by CRNA, propofol (Diprivan) 250mg  IV TOPICAL ANESTHETIC: Cetacaine Spray DESCRIPTION OF PROCEDURE: After the risks benefits and alternatives of the procedure were thoroughly explained, informed consent was obtained.  The LB [id number] [id number] endoscope was introduced through the mouth and advanced to the second portion of the duodenum without limitations. No images were saved due to technical problems. The instrument was slowly withdrawn as the mucosa was fully examined.       ESOPHAGUS:  There was LA Class D esophagitis noted in the distal and mid esophagus.  Multiple biopsies were performed.  The proximal esophagus appeared normal. STOMACH: Mild erosive gastritis (inflammation) was found in the prepyloric region of the stomach.   The stomach otherwise appeared normal. DUODENUM: The duodenal mucosa showed no abnormalities in the bulb and second portion of the duodenum.  Retroflexed views revealed a hiatal hernia.     The scope was then withdrawn from the patient and the procedure completed.  COMPLICATIONS: There were no complications.  ENDOSCOPIC IMPRESSION: 1.   LA Class D esophagitis; multiple biopsies 2.   Erosive gastritis (inflammation) in the prepyloric region 3.   Small hiatal hernia  RECOMMENDATIONS: 1.  Anti-reflux regimen and 4" bedblocks 2.  Await pathology results 3.  PPI bid: omeprazole 40 mg po bid, 1 year of refills 4.  Office visit in 6-8 weeks    eSigned:  Meryl Dare, MD, [facility]  10/20/2012 4:02 PM

## 2012-10-20 NOTE — Op Note (Signed)
[facility] [address] [place], [address]   COLONOSCOPY PROCEDURE REPORT  PATIENT: Amy Leonard, Amy Leonard.  MR#: [id number] BIRTHDATE: [birth year] , 52  yrs. old GENDER: Female ENDOSCOPIST: Meryl Dare, MD, [facility] REFERRED MW:UXLKG Fransisca Connors, M.D. PROCEDURE DATE:  10/20/2012 PROCEDURE:   Colonoscopy with snare polypectomy ASA CLASS:   Class II INDICATIONS:average risk screening. MEDICATIONS: MAC sedation, administered by CRNA and propofol (Diprivan) 250mg  IV DESCRIPTION OF PROCEDURE:   After the risks benefits and alternatives of the procedure were thoroughly explained, informed consent was obtained.  A digital rectal exam revealed no abnormalities of the rectum.   The LB [id number]  endoscope was introduced through the anus and advanced to the cecum, which was identified by both the appendix and ileocecal valve. No adverse events experienced.   The quality of the prep was excellent, using MoviPrep  The instrument was then slowly withdrawn as the colon was fully examined.  COLON FINDINGS: A sessile polyp measuring 6 mm in size was found in the sigmoid colon.  A polypectomy was performed with a cold snare. The resection was complete and the polyp tissue was completely retrieved.   The colon was otherwise normal.  There was no diverticulosis, inflammation, polyps or cancers unless previously stated.  Retroflexed views revealed small internal hemorrhoids. The time to cecum=2 minutes 24 seconds.  Withdrawal time=11 minutes 20 seconds.  The scope was withdrawn and the procedure completed.  COMPLICATIONS: There were no complications.  ENDOSCOPIC IMPRESSION: 1.   Sessile polyp measuring 6 mm in the sigmoid colon; polypectomy performed with a cold snare 2.   Small internal hemorrhoids  RECOMMENDATIONS: 1.  Await pathology results 2.  Repeat colonoscopy in 5 years if polyp adenomatous; otherwise 10 years   eSigned:  Meryl Dare, MD, [facility] 10/20/2012  3:37 PM

## 2012-10-21 ENCOUNTER — Telehealth: Payer: Self-pay

## 2012-10-21 NOTE — Telephone Encounter (Signed)
  Follow up Call-  Call back number 10/20/2012  Post procedure Call Back phone  # [phone]  Permission to leave phone message Yes     Patient questions:  Do you have a fever, pain , or abdominal swelling? no Pain Score  0 *  Have you tolerated food without any problems? yes  Have you been able to return to your normal activities? yes  Do you have any questions about your discharge instructions: Diet   no Medications  no Follow up visit  no  Do you have questions or concerns about your Care? no  Actions: * If pain score is 4 or above: No action needed, pain <4.  No problems per the pt. Maw

## 2012-10-22 ENCOUNTER — Other Ambulatory Visit: Payer: Self-pay | Admitting: Family Medicine

## 2012-10-22 ENCOUNTER — Ambulatory Visit
Admission: RE | Admit: 2012-10-22 | Discharge: 2012-10-22 | Disposition: A | Discharge status: Home / Self Care | Payer: BC Managed Care – PPO | Source: Ambulatory Visit | Attending: Family Medicine | Admitting: Family Medicine

## 2012-10-22 DIAGNOSIS — R928 Other abnormal and inconclusive findings on diagnostic imaging of breast: Secondary | ICD-10-CM

## 2012-10-27 ENCOUNTER — Ambulatory Visit
Admission: RE | Admit: 2012-10-27 | Discharge: 2012-10-27 | Disposition: A | Discharge status: Home / Self Care | Payer: BC Managed Care – PPO | Source: Ambulatory Visit | Attending: Family Medicine | Admitting: Family Medicine

## 2012-10-27 ENCOUNTER — Other Ambulatory Visit: Payer: Self-pay | Admitting: Family Medicine

## 2012-10-27 ENCOUNTER — Encounter: Payer: Self-pay | Admitting: Gastroenterology

## 2012-10-27 DIAGNOSIS — R928 Other abnormal and inconclusive findings on diagnostic imaging of breast: Secondary | ICD-10-CM

## 2012-10-28 ENCOUNTER — Other Ambulatory Visit: Payer: Self-pay | Admitting: Family Medicine

## 2012-10-28 ENCOUNTER — Ambulatory Visit
Admission: RE | Admit: 2012-10-28 | Discharge: 2012-10-28 | Disposition: A | Discharge status: Home / Self Care | Payer: BC Managed Care – PPO | Source: Ambulatory Visit | Attending: Family Medicine | Admitting: Family Medicine

## 2012-10-28 DIAGNOSIS — C50911 Malignant neoplasm of unspecified site of right female breast: Secondary | ICD-10-CM

## 2012-10-28 DIAGNOSIS — R928 Other abnormal and inconclusive findings on diagnostic imaging of breast: Secondary | ICD-10-CM

## 2012-10-28 DIAGNOSIS — C50912 Malignant neoplasm of unspecified site of left female breast: Secondary | ICD-10-CM

## 2012-10-29 ENCOUNTER — Telehealth: Payer: Self-pay | Admitting: Gastroenterology

## 2012-10-29 ENCOUNTER — Telehealth: Payer: Self-pay | Admitting: *Deleted

## 2012-10-29 DIAGNOSIS — K2100 Gastro-esophageal reflux disease with esophagitis, without bleeding: Secondary | ICD-10-CM

## 2012-10-29 DIAGNOSIS — D126 Benign neoplasm of colon, unspecified: Secondary | ICD-10-CM

## 2012-10-29 DIAGNOSIS — R1310 Dysphagia, unspecified: Secondary | ICD-10-CM

## 2012-10-29 DIAGNOSIS — C50419 Malignant neoplasm of upper-outer quadrant of unspecified female breast: Secondary | ICD-10-CM | POA: Insufficient documentation

## 2012-10-29 DIAGNOSIS — R1319 Other dysphagia: Secondary | ICD-10-CM

## 2012-10-29 DIAGNOSIS — Z1211 Encounter for screening for malignant neoplasm of colon: Secondary | ICD-10-CM

## 2012-10-29 DIAGNOSIS — C50411 Malignant neoplasm of upper-outer quadrant of right female breast: Secondary | ICD-10-CM

## 2012-10-29 MED ORDER — OMEPRAZOLE 20 MG PO CPDR: 40.0000 mg | DELAYED_RELEASE_CAPSULE | Freq: Two times a day (BID) | ORAL | Status: DC

## 2012-10-29 MED ORDER — OMEPRAZOLE 40 MG PO CPDR: 40.0000 mg | DELAYED_RELEASE_CAPSULE | Freq: Two times a day (BID) | ORAL | Status: DC

## 2012-10-29 NOTE — Telephone Encounter (Signed)
Confirmed [facility] for 11/04/12 at 1200 .  Instructions and contact information given.

## 2012-10-29 NOTE — Telephone Encounter (Signed)
Reviewed with the patient the results of procedures.  She needs a new rx for omeprazole.  I have sent a new rx to her pharmacy

## 2012-11-03 ENCOUNTER — Ambulatory Visit
Admission: RE | Admit: 2012-11-03 | Discharge: 2012-11-03 | Disposition: A | Discharge status: Home / Self Care | Payer: BC Managed Care – PPO | Source: Ambulatory Visit | Attending: Family Medicine | Admitting: Family Medicine

## 2012-11-03 DIAGNOSIS — C50912 Malignant neoplasm of unspecified site of left female breast: Secondary | ICD-10-CM

## 2012-11-03 DIAGNOSIS — C50911 Malignant neoplasm of unspecified site of right female breast: Secondary | ICD-10-CM

## 2012-11-03 MED ORDER — GADOBENATE DIMEGLUMINE 529 MG/ML IV SOLN
17.0000 mL | Freq: Once | INTRAVENOUS | Status: AC | PRN
  Administered 2012-11-03: 17 mL via INTRAVENOUS

## 2012-11-04 ENCOUNTER — Encounter: Payer: Self-pay | Admitting: Oncology

## 2012-11-04 ENCOUNTER — Encounter: Payer: Self-pay | Admitting: *Deleted

## 2012-11-04 ENCOUNTER — Ambulatory Visit: Payer: BC Managed Care – PPO

## 2012-11-04 ENCOUNTER — Ambulatory Visit (HOSPITAL_BASED_OUTPATIENT_CLINIC_OR_DEPARTMENT_OTHER): Payer: BC Managed Care – PPO | Admitting: Oncology

## 2012-11-04 ENCOUNTER — Ambulatory Visit
Admission: RE | Admit: 2012-11-04 | Discharge: 2012-11-04 | Disposition: A | Discharge status: Home / Self Care | Payer: BC Managed Care – PPO | Source: Ambulatory Visit | Attending: Radiation Oncology | Admitting: Radiation Oncology

## 2012-11-04 ENCOUNTER — Ambulatory Visit: Payer: BC Managed Care – PPO | Attending: General Surgery | Admitting: Physical Therapy

## 2012-11-04 ENCOUNTER — Other Ambulatory Visit (HOSPITAL_BASED_OUTPATIENT_CLINIC_OR_DEPARTMENT_OTHER): Payer: BC Managed Care – PPO | Admitting: Lab

## 2012-11-04 ENCOUNTER — Ambulatory Visit (HOSPITAL_BASED_OUTPATIENT_CLINIC_OR_DEPARTMENT_OTHER): Payer: BC Managed Care – PPO | Admitting: General Surgery

## 2012-11-04 ENCOUNTER — Encounter (INDEPENDENT_AMBULATORY_CARE_PROVIDER_SITE_OTHER): Payer: Self-pay | Admitting: General Surgery

## 2012-11-04 VITALS — BP 158/85 | HR 80 | Temp 98.3°F | Resp 20 | Ht 63.5 in | Wt 187.9 lb

## 2012-11-04 DIAGNOSIS — R293 Abnormal posture: Secondary | ICD-10-CM | POA: Insufficient documentation

## 2012-11-04 DIAGNOSIS — Z171 Estrogen receptor negative status [ER-]: Secondary | ICD-10-CM

## 2012-11-04 DIAGNOSIS — C50119 Malignant neoplasm of central portion of unspecified female breast: Secondary | ICD-10-CM

## 2012-11-04 DIAGNOSIS — C50411 Malignant neoplasm of upper-outer quadrant of right female breast: Secondary | ICD-10-CM

## 2012-11-04 DIAGNOSIS — C50919 Malignant neoplasm of unspecified site of unspecified female breast: Secondary | ICD-10-CM

## 2012-11-04 DIAGNOSIS — C50112 Malignant neoplasm of central portion of left female breast: Secondary | ICD-10-CM

## 2012-11-04 DIAGNOSIS — IMO0001 Reserved for inherently not codable concepts without codable children: Secondary | ICD-10-CM | POA: Insufficient documentation

## 2012-11-04 DIAGNOSIS — M25619 Stiffness of unspecified shoulder, not elsewhere classified: Secondary | ICD-10-CM | POA: Insufficient documentation

## 2012-11-04 DIAGNOSIS — C50419 Malignant neoplasm of upper-outer quadrant of unspecified female breast: Secondary | ICD-10-CM

## 2012-11-04 LAB — COMPREHENSIVE METABOLIC PANEL (CC13)
Albumin: 3.4 g/dL — ABNORMAL LOW (ref 3.5–5.0)
Alkaline Phosphatase: 64 U/L (ref 40–150)
Calcium: 9.2 mg/dL (ref 8.4–10.4)
Chloride: 107 mEq/L (ref 98–107)
Glucose: 96 mg/dl (ref 70–99)
Potassium: 3.9 mEq/L (ref 3.5–5.1)
Sodium: 139 mEq/L (ref 136–145)
Total Protein: 6.4 g/dL (ref 6.4–8.3)

## 2012-11-04 LAB — CBC WITH DIFFERENTIAL/PLATELET
Basophils Absolute: 0.1 10*3/uL (ref 0.0–0.1)
EOS%: 1.7 % (ref 0.0–7.0)
HCT: 40 % (ref 34.8–46.6)
HGB: 13.4 g/dL (ref 11.6–15.9)
LYMPH%: 34.4 % (ref 14.0–49.7)
MCH: 31.2 pg (ref 25.1–34.0)
MCV: 93.4 fL (ref 79.5–101.0)
MONO%: 7.4 % (ref 0.0–14.0)
NEUT%: 55.9 % (ref 38.4–76.8)

## 2012-11-04 NOTE — Progress Notes (Signed)
Amy Leonard [id number] [birth year] 52 y.o. 11/04/2012 3:52 PM  CC  [name], MD [address] [place] [address], [place] [address] Dr. Claud Kelp Dr. Antony Blackbird  REASON FOR CONSULTATION:  52 year old female with new diagnosis of bilateral breast cancers.  Patient was seen in the Multidisciplinary Breast Clinic for discussion of her treatment options.  STAGE:   Cancer of upper-outer quadrant of female breast   Primary site: Breast (Bilateral)   Staging method: AJCC 7th Edition   Clinical: Stage IA (T1c, N0, cM0)   Summary: Stage IA (T1c, N0, cM0)  REFERRING PHYSICIAN: Dr. Claud Kelp  HISTORY OF PRESENT ILLNESS:  Amy Leonard is a 52 y.o. female.  Medical history significant for anxiety depression chronic back pain tobacco abuse. Patient recently underwent screening mammograms and she was found to have bilateral breast abnormalities.diagnostic mammogram showed in the right breast an ill-defined 2 cm mass with a few associated well defined microcalcifications over the upper outer quadrant. Spot compression images of the left breast demonstrated an ill defined spiculated 8 mm mass centrally. Ultrasound showed irregular bordered heterogeneous hypoechoic solid mass at the 10:00 position of the right breast 6 cm from the nipple measuring 1.1 x 1.3 x 1.4 cm. Also in this location was an adjacent smaller irregular hypoechoic mass measuring 3 x 5 x 6 mm. The smaller mass was 1 cm from the large more irregular mass. Ultrasound of the axilla demonstrated single lymph node with thickened cortex. Ultrasound of the left breast demonstrated an ill-defined hypoechoic mass with distal acoustic shadowing 7 to 8:00 position 3 cm from the nipple located deep and measuring 5 x 8 x 8 mm ultrasound of the left axilla showed normal appearing lymph nodes. Patient went on to have needle core biopsies performed of both of the masses. The pathology showed invasive ductal  carcinoma in the left breast at the 9:00 position the right needle core biopsy showed invasive ductal carcinoma at the 10:00 position. The tumor was ER positive PR positive with a Ki-67 of 9% the second tumor was ER negative PR negative HER-2/neu negative with Ki-6792% and elevated. Patient had MRIs of the breasts performed. There was noted to be 1.7 cm irregular enhancing mass located within the upper-outer quadrant of the right breast with 3 adjacent worrisome enhancing satellite nodules in aggregate the nodules and mass measures 3.8 cm. In the left breast 8 mm irregular enhancing mass located within the central left breast was noted. No evidence of axillary or internal mammary adenopathy. Patient's case was discussed at the multidisciplinary breast conference. Her pathology and radiology were reviewed. She is without any significant complaints except for her chronic ongoing issues. She was seen by Dr. Antony Blackbird as well as Dr. Claud Kelp.   Past Medical History: Past Medical History  Diagnosis Date  . Elevated WBC count     nl diff  . Chronic back pain     spinal stenosis  . Tobacco abuse   . Anxiety and depression   . HSV infection     recurrent (side and buttox)  . Migraine   . Glaucoma(365)     ?  Marland Kitchen Carpal tunnel syndrome   . HLD (hyperlipidemia)   . Folliculitis 05/05/2011  . Anxiety   . Depression   . 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 GERD (gastroesophageal reflux disease)   . Hypertension   . Breast cancer     Past Surgical History: Past Surgical History  Procedure Laterality  Date  . Btl    . Epidural steroid injection  2008    Family History: Family History  Problem Relation Age of Onset  . Stroke Maternal Grandmother   . Diabetes Mother   . Hypertension Mother   . Leukemia Mother   . Obesity Mother   . Depression Sister   . Bipolar disorder Son     Bipolar / oppositional defiant  . Alcohol abuse Sister   . Drug abuse Sister   . Drug abuse Brother    . Alcohol abuse Brother   . Alcohol abuse Sister   . Drug abuse Sister   . Colon cancer Neg Hx   . Diabetes Maternal Grandmother     Social History History  Substance Use Topics  . Smoking status: Current Every Day Smoker -- 0.50 packs/day    Types: Cigarettes  . Smokeless tobacco: Never Used     Comment: 1 1/2 ppd -form given 10-07-12  . Alcohol Use: Yes     Comment: 4-5 beers fri and sat night-does this rarely    Allergies: Allergies  Allergen Reactions  . Bupropion Hcl Nausea Only and Other (See Comments)     GI, sleepy  . Hydrocod Polst-Cpm Polst Er Nausea And Vomiting    vomiting  . Lisinopril Other (See Comments)    Leg cramps    . Naproxen Sodium Other (See Comments)    does not tolerate    Current Medications: Current Outpatient Prescriptions  Medication Sig Dispense Refill  . aspirin 81 MG chewable tablet Chew 81 mg by mouth daily.      Marland Kitchen atorvastatin (LIPITOR) 10 MG tablet Take 1 tablet (10 mg total) by mouth daily.  90 tablet  1  . losartan (COZAAR) 50 MG tablet Take 1 tablet (50 mg total) by mouth daily.  30 tablet  11  . omeprazole (PRILOSEC) 40 MG capsule Take 1 capsule (40 mg total) by mouth 2 (two) times daily.  60 capsule  11  . Acetaminophen (TYLENOL ARTHRITIS PAIN PO) Take 2 tablets by mouth as needed.      Marland Kitchen acyclovir (ZOVIRAX) 5 % ointment Apply topically as needed.       . calcium carbonate (TUMS - DOSED IN MG ELEMENTAL CALCIUM) 500 MG chewable tablet Chew by mouth as directed.        . cyclobenzaprine (FLEXERIL) 10 MG tablet Take 10 mg by mouth as needed.       . traMADol (ULTRAM) 50 MG tablet Take 1 tablet (50 mg total) by mouth every 8 (eight) hours as needed.  30 tablet  0  . varenicline (CHANTIX) 0.5 MG tablet Take 1 tablet (0.5 mg total) by mouth 2 (two) times daily.  60 tablet  3   No current facility-administered medications for this visit.    OB/GYN History:menarche at 14, postmenopause, no HRT,   Fertility Discussion: NA Prior  History of Cancer: NA  Health Maintenance:  Colonoscopy 2014 Bone Density no Last PAP smear 2014   ECOG PERFORMANCE STATUS: 0 - Asymptomatic  Genetic Counseling/testing:patient with bilateral breast cancers therefore refer to genetics  REVIEW OF SYSTEMS:  see scan ROS sheet  PHYSICAL EXAMINATION: Blood pressure 158/85, pulse 80, temperature 98.3 F (36.8 C), temperature source Oral, resp. rate 20, height 5' 3.5" (1.613 m), weight 187 lb 14.4 oz (85.231 kg).  AVW:UJWJX, healthy, no distress, well nourished and well developed SKIN: skin color, texture, turgor are normal HEAD: Normocephalic EYES: PERRLA, EOMI EARS: External ears normal OROPHARYNX:no exudate and  no erythema  NECK: supple, no adenopathy LYMPH:  no palpable lymphadenopathy, no hepatosplenomegaly BREAST:abnormal mass palpable hematoma, left breast no masses or skin changes LUNGS: clear to auscultation and percussion HEART: regular rate & rhythm ABDOMEN:abdomen soft, non-tender, normal bowel sounds and no masses or organomegaly BACK: Back symmetric, no curvature., No CVA tenderness EXTREMITIES:no edema, no clubbing, no cyanosis  NEURO: alert & oriented x 3 with fluent speech, no focal motor/sensory deficits, gait normal, reflexes normal and symmetric     STUDIES/RESULTS: Dg Esophagus  10/16/2012  *RADIOLOGY REPORT*  Clinical Data: Dysphagia.  Cough and vomiting.  History of reflux.  ESOPHOGRAM/BARIUM SWALLOW  Technique:  Combined double contrast and single contrast examination performed using effervescent crystals, thick barium liquid, and thin barium liquid.  Fluoroscopy time:  2.3 minutes.  Comparison:  None.  Findings:  Oral pharyngeal portion exam is remarkable for a mildly prominent cricopharyngeus muscle.  Double contrast evaluation of the esophagus demonstrates no mucosal abnormalities.  Evaluation of primary peristalsis demonstrates incomplete primary peristaltic wave with contrast stasis in the mid and lower  thoracic esophagus.  Full column evaluation of the esophagus demonstrates no persistent narrowing or stricture.  A small hiatal hernia  13 mm barium tablet passes promptly.  IMPRESSION:  1.  Mild nonspecific esophageal dysmotility.  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 2.  Small hiatal hernia.   Original Report Authenticated By: Jeronimo Greaves, M.D.    US Breast Bilateral  10/22/2012  *RADIOLOGY REPORT*  Clinical Data:  Patient presents for additional views of both breasts as follow-up to recent screening exam suggesting bilateral masses.  DIGITAL DIAGNOSTIC BILATERAL MAMMOGRAM  AND BILATERAL BREAST ULTRASOUND:  Comparison:  03/06/2010 and 02/23/2010  Findings:  ACR Breast Density Category 2: There is a scattered fibroglandular pattern.  Spot compression images of the right breast demonstrate an ill- defined 2 cm mass with a few associated well-defined microcalcifications over the upper outer quadrant.  Spot compression images of the left breast demonstrate an ill-defined spiculated 8 mm mass centrally.  Ultrasound is performed, showing an irregular bordered somewhat heterogeneous hypoechoic solid mass at the 10 o'clock position of the right breast 6 cm from the nipple measuring 1.1 x 1.3 x 1.4 cm. Also in this location is an adjacent small irregular hypoechoic mass measuring 3 x 5 x 6 mm.  This smaller mass is 1 cm from the larger more irregular mass.  Ultrasound of the right axilla demonstrates a single lymph node with thickened cortex.  Other normal-appearing lymph nodes are seen.  Ultrasound of the left breast demonstrates an ill-defined hypoechoic mass with distal acoustic shadowing at the seven to eight o'clock position 3 cm from the nipple located deep and measuring all of 5 x 7 x 8 mm.  Ultrasound of the left axilla demonstrates normal appearing lymph nodes.  IMPRESSION: Two adjacent irregular suspicious hypoechoic solid masses in the right breast at the 10 o'clock position 6 cm from the nipple with the larger  measuring 1.1 x 1.3 x 1.4 cm and the smaller measuring 3 x 5 x 6 mm concerning for malignancy. Single abnormal right axillary lymph node with thickened cortex.  Suspicious irregular hypoechoic mass at the seven to eight o'clock position of the left breast 3 cm from the nipple measuring 5 x 7 x 8 mm.  RECOMMENDATION: Would recommend ultrasound-guided core biopsy of the the larger mass at the 10 o'clock position of the right breasts as well as the abnormal right axillary lymph node.  Also recommend ultrasound core biopsy of the  suspicious 8 mm mass over the central left breast.  I have discussed the findings and recommendations with the patient. Results were also provided in writing at the conclusion of the visit.  BI-RADS CATEGORY 5:  Highly suggestive of malignancy - appropriate action should be taken.  Biopsy scheduled for Tuesday 10/27/2012 and 830am.   Original Report Authenticated By: Elberta Fortis, M.D.    Mr Breast Bilateral W Wo Contrast  11/03/2012  *RADIOLOGY REPORT*  Clinical Data: Recently diagnosed bilateral invasive ductal carcinoma.  Preoperative evaluation.  BUN and creatinine were obtained on site at [facility] at [address] Results:  BUN 13 mg/dL,  Creatinine 0.8 mg/dL.  BILATERAL BREAST MRI WITH AND WITHOUT CONTRAST  Technique: Multiplanar, multisequence MR images of both breasts were obtained prior to and following the intravenous administration of 17ml of Multihance.  Three dimensional images were evaluated at the independent DynaCad workstation.  Comparison:  10/27/2012, 10/22/2012, 09/28/2012, 02/23/2010.  Findings: There is minimal bilateral background parenchymal enhancement.  Within the middle third of the right breast located at the 10 o'clock position (upper-outer quadrant) is an irregular enhancing mass with associated central clip artifact measuring 1.5 x 1.0 x 1.7 cm in size. This is associated with a mixture of plateau and washout enhancement kinetics.  This  corresponds to the recently diagnosed invasive ductal carcinoma.  In addition, there are two adjacent enhancing satellite nodules with predominately washout enhancement kinetics separated from the larger mass by approximately 5 mm.  These measure 7 and 8 mm in size.  There is also a smaller 4 mm area of irregular enhancement located 5 mm anterior to the most anterior satellite nodule which is also an area of worrisome enhancement.  In aggregate, these areas of enhancement measure 3.8 cm.  There are no additional worrisome enhancing foci within the right breast.  Within the central middle one third of the left breast is an irregular enhancing mass with adjacent clip artifact corresponding to the recently diagnosed invasive ductal carcinoma.  This is associated with plateau enhancement kinetics.  This measures 8 x 7 x 7 mm in size.  There are no additional worrisome enhancing foci within the left breast.  There is no evidence for axillary or internal mammary adenopathy and there are no additional findings.  IMPRESSION:  1.  1.7 cm irregular enhancing mass located within the upper-outer quadrant of the right breast with three adjacent worrisome enhancing satellite nodules as discussed above.  In aggregate, the nodules and mass measures 3.8 cm in greatest diameter. 2.  8 mm irregular enhancing mass located within the central left breast corresponding to the recently diagnosed invasive ductal carcinoma. 3.  No evidence for axillary or internal mammary adenopathy and no additional findings.  RECOMMENDATION: Treatment plan  THREE-DIMENSIONAL MR IMAGE RENDERING ON INDEPENDENT WORKSTATION:  Three-dimensional MR images were rendered by post-processing of the original MR data on an independent workstation.  The three- dimensional MR images were interpreted, and findings were reported in the accompanying complete MRI report for this study.  BI-RADS CATEGORY 6:  Known biopsy-proven malignancy - appropriate action should be  taken.   Original Report Authenticated By: Rolla Plate, M.D.    Dg Swallowing Func-speech Pathology  10/14/2012  Chales Abrahams, CCC-SLP     10/14/2012  1:58 PM Objective Swallowing Evaluation: Modified Barium Swallowing Study   Patient Details  Name: KIAJA SHORTY MRN: [id number] Date of Birth: November 24, 1960  Today's Date: 10/14/2012 Time: 1310-1335 SLP Time Calculation (min): 25 min  Past Medical History:  Past Medical History  Diagnosis Date  . Elevated WBC count     nl diff  . Chronic back pain     spinal stenosis  . Tobacco abuse   . Anxiety and depression   . HSV infection     recurrent (side and buttox)  . Migraine   . Glaucoma(365)     ?  Marland Kitchen Carpal tunnel syndrome   . HLD (hyperlipidemia)   . Folliculitis 05/05/2011  . Anxiety   . Depression   . 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 Btl    . Epidural steroid injection  2008   HPI:  52 yo female referred by Dr Russella Dar for [facility] - followed by  esophagram.  Pt PMH + for HTN, GERD and  "mini stroke" approx one  year ago per pt. Pt medication list includes Chantix, ASA 81 mg,  Atorvastatin, and Lisinopril.  Pt admits to stopping Lisinopril a  few days ago due to cramping.  She also has not started taking  her omeprazole - though she filled the prescription. Dysphagia x  4 months with progression characterized by coughing on foods  reported.   Pt states this occurs with nearly every meal and at  times will be intense enough to require back tapping to clear.   She further states she has tried to eat small bites/ chew well/  drink liquids and this has not stopped her symptoms.  Pt has  taken Rolaids prn in the past for reflux but when they stopped  working she started Zantac prn.  She does smoke 1/2 PPD  currently, previously 1 PPD, started smoking at age 20 and is  taking Chantix to attempt to cease smoking.       Assessment / Plan / Recommendation Clinical Impression  Dysphagia Diagnosis:  Within Functional Limits;Suspected primary  esophageal dysphagia Clinical impression: Pt presents with functional oropharyngeal  swallow ability.  No aspiration or deep laryngeal penetration  observed, swallow was timely with only trace vallecular stasis of  liquid.  Pt did sense "agitation" in pharynx after swallowing  pudding and stated she "felt she needed to swallow again."   Pharynx was clear and pt was instructed to finding.  Pt appeared  with mildly delayed clearance at distal esophagus after several  boluses of liquids - to which she sensed and stated she would  drink milk at home to clear.  Radiologist not present for MBS but  pt is to have esophagram following this test, dysphagia symptoms  are not due to oropharyngeal deficits.  Appreciate this referral.       Treatment Recommendation    n/a   Diet Recommendation Regular;Thin liquid   Liquid Administration via: Cup;Straw Medication Administration: Whole meds with liquid Supervision: Patient able to self feed Compensations: Follow solids with liquid;Slow rate;Small  sips/bites (pt drinks liquids throughout meal to help w/symptoms  per pt) Postural Changes and/or Swallow Maneuvers: Seated upright 90  degrees;Upright 30-60 min after meal    Other  Recommendations     Follow Up Recommendations  None           SLP Swallow Goals     General Date of Onset: 10/14/12 HPI: 52 yo female referred by Dr Russella Dar for [facility] - followed by  esophagram.  Pt PMH + for HTN, GERD and  "mini stroke" approx one  year ago per pt. Pt medication list includes Chantix,  ASA 81 mg,  Atorvastatin, and Lisinopril.  Pt admits to stopping Lisinopril a  few days ago due to cramping.  She also has not started taking  her omeprazole - though she filled the prescription. Dysphagia x  4 months with progression characterized by coughing on foods  reported.   Pt states this occurs with nearly every meal and at  times will be intense enough to require back tapping to clear.   She further states she  has tried to eat small bites/ chew well/  drink liquids and this has not stopped her symptoms.  Pt has  taken Rolaids prn in the past for reflux but when they stopped  working she started Zantac prn.  She does smoke 1/2 PPD  currently, previously 1 PPD, started smoking at age 39 and is  taking Chantix to attempt to cease smoking.   Type of Study: Modified Barium Swallowing Study Reason for Referral: Objectively evaluate swallowing function Previous Swallow Assessment: none Diet Prior to this Study: Regular;Thin liquids Temperature Spikes Noted: No Respiratory Status: Room air History of Recent Intubation: No Behavior/Cognition: Alert;Cooperative Oral Cavity - Dentition: Adequate natural dentition;Dentures, top Oral Motor / Sensory Function: Within functional limits Self-Feeding Abilities: Able to feed self Patient Positioning: Upright in chair Baseline Vocal Quality: Clear Volitional Cough: Strong Volitional Swallow: Able to elicit Anatomy: Within functional limits Pharyngeal Secretions: Not observed secondary MBS    Reason for Referral Objectively evaluate swallowing function   Oral Phase Oral Preparation/Oral Phase Oral Phase: WFL Oral - Nectar Oral - Nectar Cup: Within functional limits Oral - Thin Oral - Thin Cup: Within functional limits Oral - Thin Straw: Within functional limits Oral - Solids Oral - Puree: Within functional limits Oral - Regular: Within functional limits   Pharyngeal Phase Pharyngeal Phase Pharyngeal Phase: Within functional limits Pharyngeal - Nectar Pharyngeal - Nectar Cup: Within functional limits Pharyngeal - Thin Pharyngeal - Thin Cup: Pharyngeal residue - valleculae (trace  vallecular stasis, functional) Pharyngeal - Thin Straw: Pharyngeal residue - valleculae (trace  vallecular residuals, functional) Pharyngeal - Solids Pharyngeal - Puree: Within functional limits Pharyngeal - Regular: Within functional limits  Cervical Esophageal Phase    GO    Cervical Esophageal Phase Cervical  Esophageal Phase: Impaired (appearance of mild slow  clearance distal, pt pending esophag) Cervical Esophageal Phase - Nectar Nectar Cup: Within functional limits Cervical Esophageal Phase - Thin Thin Cup: Within functional limits Cervical Esophageal Phase - Solids Puree: Within functional limits Regular: Within functional limits    Functional Assessment Tool Used: mbs, clinical judgement Functional Limitations: Swallowing Swallow Current Status (W0981): At least 1 percent but less than  20 percent impaired, limited or restricted Swallow Goal Status [phone]): At least 1 percent but less than 20  percent impaired, limited or restricted Swallow Discharge Status [phone]): At least 1 percent but less  than 20 percent impaired, limited or restricted    Donavan Burnet, [facility] SLP [phone]     Mm Digital Diagnostic Bilat  10/27/2012  *RADIOLOGY REPORT*  Clinical Data:  Bilateral breast biopsies were performed today. These included a palpable mass 10 o'clock position of the right breast, and a nonpalpable mass in the 9 o'clock position of the left breast.  DIGITAL DIAGNOSTIC BILATERAL MAMMOGRAM  Comparison:  Previous exams.  Findings:  Films are performed following ultrasound guided biopsies of the right breast mass at 10 o'clock position and a left breast mass at 9 o'clock position.  A ribbon-shaped biopsy clip is satisfactorily positioned within the  irregular mass in the upper outer quadrant of the right breast.  A coil-shaped biopsy clip is satisfactorily positioned along the anterior and medial margin of a suspicious mass in the central left breast, just medial to the level the nipple, in the 9 o'clock position periareolar.  IMPRESSION: Satisfactory position of biopsy clips in both breasts.   Original Report Authenticated By: Britta Mccreedy, M.D.    Mm Digital Diagnostic Bilat  10/22/2012  *RADIOLOGY REPORT*  Clinical Data:  Patient presents for additional views of both breasts as follow-up to recent screening exam  suggesting bilateral masses.  DIGITAL DIAGNOSTIC BILATERAL MAMMOGRAM  AND BILATERAL BREAST ULTRASOUND:  Comparison:  03/06/2010 and 02/23/2010  Findings:  ACR Breast Density Category 2: There is a scattered fibroglandular pattern.  Spot compression images of the right breast demonstrate an ill- defined 2 cm mass with a few associated well-defined microcalcifications over the upper outer quadrant.  Spot compression images of the left breast demonstrate an ill-defined spiculated 8 mm mass centrally.  Ultrasound is performed, showing an irregular bordered somewhat heterogeneous hypoechoic solid mass at the 10 o'clock position of the right breast 6 cm from the nipple measuring 1.1 x 1.3 x 1.4 cm. Also in this location is an adjacent small irregular hypoechoic mass measuring 3 x 5 x 6 mm.  This smaller mass is 1 cm from the larger more irregular mass.  Ultrasound of the right axilla demonstrates a single lymph node with thickened cortex.  Other normal-appearing lymph nodes are seen.  Ultrasound of the left breast demonstrates an ill-defined hypoechoic mass with distal acoustic shadowing at the seven to eight o'clock position 3 cm from the nipple located deep and measuring all of 5 x 7 x 8 mm.  Ultrasound of the left axilla demonstrates normal appearing lymph nodes.  IMPRESSION: Two adjacent irregular suspicious hypoechoic solid masses in the right breast at the 10 o'clock position 6 cm from the nipple with the larger measuring 1.1 x 1.3 x 1.4 cm and the smaller measuring 3 x 5 x 6 mm concerning for malignancy. Single abnormal right axillary lymph node with thickened cortex.  Suspicious irregular hypoechoic mass at the seven to eight o'clock position of the left breast 3 cm from the nipple measuring 5 x 7 x 8 mm.  RECOMMENDATION: Would recommend ultrasound-guided core biopsy of the the larger mass at the 10 o'clock position of the right breasts as well as the abnormal right axillary lymph node.  Also recommend ultrasound  core biopsy of the suspicious 8 mm mass over the central left breast.  I have discussed the findings and recommendations with the patient. Results were also provided in writing at the conclusion of the visit.  BI-RADS CATEGORY 5:  Highly suggestive of malignancy - appropriate action should be taken.  Biopsy scheduled for Tuesday 10/27/2012 and 830am.   Original Report Authenticated By: Elberta Fortis, M.D.    Mm Radiologist Eval And Mgmt  10/28/2012  *RADIOLOGY REPORT*  ESTABLISHED PATIENT OFFICE VISIT - LEVEL II [phone])  Chief Complaint:  The patient returns for discussion of the pathology report after ultrasound-guided core needle biopsy of a mass in each breast.  History:  The patient underwent screening mammography on 09/24/2012. A mass was noted in each breast and she was recalled for further evaluation which was performed on 10/22/2012.  A mass in each breast was biopsied with ultrasound guidance.  Exam:  On physical examination, there is mild ecchymosis surrounding the right biopsy site.  There is no sign  of hematoma or infection.  The left biopsy site is clean and dry with no sign of hematoma or infection.  Pathology: Histologic evaluation of the mass on the right demonstrates invasive ductal carcinoma.  Histologic evaluation of the mass on the left demonstrates invasive ductal carcinoma.  These findings are concordant with the imaging findings.  Assessment and Plan:  Results were discussed with the patient and her daughter.  The patient was scheduled to be evaluated in the [facility] on 11/04/2012.  She is scheduled for breast MRI on 11/03/2012.  Questions were answered.  Informational materials were given.   Original Report Authenticated By: Cain Saupe, M.D.    [place] Lt Breast Bx W Loc Dev 1st Lesion Img Bx Spec US Guide  10/27/2012  *RADIOLOGY REPORT*  Clinical Data:  Suspicious mass identified in the left breast on recent mammogram and ultrasound.  Today, I  visualize this mass at 9 o'clock position in the left breast approximately 3 cm from the nipple.  ULTRASOUND GUIDED VACUUM ASSISTED CORE BIOPSY OF THE LEFT BREAST  Comparison: Previous exams.  I met with the patient and we discussed the procedure of ultrasound- guided biopsy, including benefits and alternatives.  We discussed the high likelihood of a successful procedure. We discussed the risks of the procedure including infection, bleeding, tissue injury, clip migration, and inadequate sampling.  Informed written consent was given.  Using sterile technique, 2% lidocaine ultrasound guidance and a 12 gauge vacuum assisted needle biopsy was performed of a an approximately 8 mm left breast mass at 9 o'clock position using a lateral to medial approach. After the first biopsy pass, the mass became more difficult to visualize.  A second tissue sample was taken.  At the conclusion of the procedure, a coil-shaped tissue marker clip was deployed into the biopsy cavity.  Follow-up 2-view mammogram was performed and dictated separately.  IMPRESSION: Ultrasound-guided biopsy of left breast mass at 9 o'clock position. No apparent complications.   Original Report Authenticated By: Britta Mccreedy, M.D.    [place] Rt Breast Bx W Loc Dev 1st Lesion Img Bx Spec US Guide  10/27/2012  *RADIOLOGY REPORT*  Clinical Data:  The patient presents today for biopsy of a 1.3 cm palpable mass 10 o'clock position of the right breast and right axillary lymph node with a thickened cortex.  Prior to the procedure today, I have carefully ultrasound that has the right axilla.  I do not appreciate a right axillary lymph node on today's ultrasound which is enlarged or has worrisome features.  Therefore, right axillary lymph node biopsy was not performed.  The study describes the biopsy of the right breast mass at 10 o'clock position.  ULTRASOUND GUIDED VACUUM ASSISTED CORE BIOPSY OF THE RIGHT BREAST  Comparison: Previous exams.  I met with the patient and we  discussed the procedure of ultrasound- guided biopsy, including benefits and alternatives.  We discussed the high likelihood of a successful procedure. We discussed the risks of the procedure including infection, bleeding, tissue injury, clip migration, and inadequate sampling.  Informed written consent was given.  Using sterile technique, 2% lidocaine ultrasound guidance and a 12 gauge vacuum assisted needle biopsy was performed of a 1.3 cm irregular solid mass at 10 o'clock position approximately 6 cm from the nipple using a lateral to medial approach.  At the conclusion of the procedure, a a ribbon-shaped tissue marker clip was deployed into the biopsy cavity.  Follow-up 2-view mammogram was performed and dictated separately.  IMPRESSION:  Ultrasound-guided biopsy of right breast mass at 10 o'clock position.  No apparent complications.   Original Report Authenticated By: Britta Mccreedy, M.D.      LABS:    Chemistry      Component Value Date/Time   NA 140 08/24/2012 0805   K 4.3 08/24/2012 0805   CL 104 08/24/2012 0805   CO2 29 08/24/2012 0805   BUN 13 08/24/2012 0805   CREATININE 0.8 08/24/2012 0805      Component Value Date/Time   CALCIUM 8.9 08/24/2012 0805   ALKPHOS 44 08/24/2012 0805   AST 15 08/24/2012 0805   ALT 18 08/24/2012 0805   BILITOT 0.7 08/24/2012 0805      Lab Results  Component Value Date   WBC 10.6* 11/04/2012   HGB 13.4 11/04/2012   HCT 40.0 11/04/2012   MCV 93.4 11/04/2012   PLT 259 11/04/2012   PATHOLOGY: ADDITIONAL INFORMATION: 1. CHROMOGENIC IN-SITU HYBRIDIZATION Interpretation HER-2/NEU BY CISH - NO AMPLIFICATION OF HER-2 DETECTED. THE RATIO OF HER-2: CEP 17 SIGNALS WAS 1.42. Reference range: Ratio: HER2:CEP17 < 1.8 - gene amplification not observed Ratio: HER2:CEP 17 1.8-2.2 - equivocal result Ratio: HER2:CEP17 > 2.2 - gene amplification observed Pecola Leisure MD Pathologist, Electronic Signature ( Signed 11/02/2012) 1. PROGNOSTIC INDICATORS -  ACIS Results IMMUNOHISTOCHEMICAL AND MORPHOMETRIC ANALYSIS BY THE AUTOMATED CELLULAR IMAGING SYSTEM (ACIS) Estrogen Receptor (Negative, <1%): 100%, POSITIVE, STRONG STAINING INTENSITY Progesterone Receptor (Negative, <1%): 25%, POSITIVE, STRONG STAINING INTENSITY Proliferation Marker Ki67 by M IB-1 (Low<20%): 9% All controls stained appropriately Pecola Leisure MD Pathologist, Electronic Signature ( Signed 11/01/2012) 2. CHROMOGENIC IN-SITU HYBRIDIZATION 1 of 3 Duplicate copy FINAL for JERONDA, DON ([id number]) ADDITIONAL INFORMATION:(continued) Interpretation HER-2/NEU BY CISH - NO AMPLIFICATION OF HER-2 DETECTED. THE RATIO OF HER-2: CEP 17 SIGNALS WAS 1.38. Reference range: Ratio: HER2:CEP17 < 1.8 - gene amplification not observed Ratio: HER2:CEP 17 1.8-2.2 - equivocal result Ratio: HER2:CEP17 > 2.2 - gene amplification observed Pecola Leisure MD Pathologist, Electronic Signature ( Signed 11/02/2012) 2. PROGNOSTIC INDICATORS - ACIS Results IMMUNOHISTOCHEMICAL AND MORPHOMETRIC ANALYSIS BY THE AUTOMATED CELLULAR IMAGING SYSTEM (ACIS) Estrogen Receptor (Negative, <1%): 0%, NEGATIVE Progesterone Receptor (Negative, <1%): 0%, NEGATIVE Proliferation Marker Ki67 by M IB-1 (Low<20%): 92% COMMENT: The negative hormone receptor study(ies) in this case have an internal positive control. All controls stained appropriately Pecola Leisure MD Pathologist, Electronic Signature ( Signed 11/01/2012) FINAL DIAGNOSIS Diagnosis 1. Breast, left, needle core biopsy, mass, 9 o'clock - INVASIVE DUCTAL CARCINOMA SEE COMMENT. 2. Breast, right, needle core biopsy, mass, 10 o'clock - INVASIVE DUCTAL CARCINOMA, SEE COMMENT. Microscopic Comment 1. and 2. In both biopsies, there are definitive features of invasive ductal carcinoma. Although the grade of tumor is best assessed at resection, with these biopsies, the invasive tumor in part 1 is grade II and in part 2 is grade III. Although there is prominent  lymphoid inflammation associated with the invasive tumor is part 2, the morphologic features favor invasive carcinoma with associated lymphoid inflammation over replaced lymph node. The case was reviewed with Dr. Colonel Bald who concurs. Breast prognostic studies are pending and will be reported in an addendum. (CRR:gt, 10/28/12) 2 of 3  ASSESSMENT    52 year old female with  #1 bilateral invasive ductal carcinoma is right and right at the 10:00 and the 9:00 position. The tumor on the right side is ER negative PR negative HER-2/neu negative with an elevated Ki-67. It measures about 3.8 cm in conglomerate by MRI. The left mass measured 8 mm and was also an  invasive ductal carcinoma, ER positive PR positive HER-2/neu negative. Patient does desire breast conservation. She was seen by Dr. Claud Kelp and certainly he could do a double lumpectomy with sentinel lymph node biopsies. This was recommended risks and benefits of this were discussed with the patient by Dr. Derrell Lolling.  #2 patient does have triple-negative breast cancer and bilateral breast cancer therefore she would be eligible for genetic counseling and testing we did discuss this the rationale for it. Patient however would like to proceed with her surgery first before cancer. But she does want to have genetic counseling performed as well as the testing if eligible.  #3 my recommendation for adjuvant chemotherapy would be Adriamycin Cytoxan given dose dense 4 cycles followed by weekly Taxol and carboplatinum for a total of 12 weeks. Patient will need radiation therapy adjuvantly as well. Since patient's 1 tumor is ER positive she would be a candidate for antiestrogen therapy.  Clinical Trial Eligibility: no Multidisciplinary conference discussion yes     PLAN:    #1 patient will proceed with genetic counseling and testing.  #2 she will proceed with her surgery consisting of lumpectomies bilaterally with sentinel lymph node biopsies  bilaterally.  #3 patient will receive adjuvant chemotherapy consisting of an anthracycline there for she will need echocardiogram chemotherapy teaching class and Port-A-Cath placement. All of this were discussed with the patient        Discussion: Patient is being treated per NCCN breast cancer care guidelines appropriate for stage.Bilateral breast cancers   Thank you so much for allowing me to participate in the care of [employer]. I will continue to follow up the patient with you and assist in her care.  All questions were answered. The patient knows to call the clinic with any problems, questions or concerns. We can certainly see the patient much sooner if necessary.  I spent 60 minutes counseling the patient face to face. The total time spent in the appointment was 60 minutes.   Drue Second, MD Medical/Oncology [facility] [phone] (beeper) [phone] (Office)  11/04/2012, 3:52 PM

## 2012-11-04 NOTE — Patient Instructions (Signed)
Proceed with bilateral mastectomies  Port a cath placemnt  Chemo class   echocardiogram

## 2012-11-04 NOTE — Progress Notes (Signed)
Dawn sent patient over to see about EPP. She does have insurance 70/30. I gave her an application for assistance and how it works.I advised her of [facility] funds and [employer] if she is approved. She will mail back to me.

## 2012-11-04 NOTE — Progress Notes (Signed)
Radiation Oncology         [phone] ________________________________  Initial outpatient Consultation  Name: Amy Leonard MRN: [id number]  Date: 11/04/2012  DOB: 13-Feb-1961  JX:BJYNW Tower, MD  Ernestene Mention, MD , Drue Second, MD  REFERRING PHYSICIAN: Ernestene Mention, MD  DIAGNOSIS: Bilateral breast cancer  HISTORY OF PRESENT ILLNESS::Amy Leonard is a 52 y.o. female who is seen out courtesy of Dr. Claud Kelp as part of the multidisciplinary breast clinic. Earlier this year the patient underwent screening mammography which showed suspicious areas in both breasts. Her prior mammogram was approximately 2 years ago. 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 This tumor was estrogen receptor positive at 100% and progesterone receptor positive at 25%. There was no HER-2/neu amplification. The right biopsy showed invasive ductal carcinoma however was more aggressive tumor being estrogen and progesterone receptor negative with a Ki 67 marker of 92%.   there was no HER-2/neu amplification of the tumor along the right side (triple negative).  MRI of the breast did show multifocal lesion within the right breast. The primary lesion measuring 1.7 cm with 3 adjacent worrisome enhancing satellite nodules. In aggregate this area  and masses measuring approximately 3.8 cm.  This information the patient is now seen for evaluation.   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    PAST SURGICAL HISTORY: Past Surgical History    Procedure Laterality Date  . Btl    . Epidural steroid injection  2008    FAMILY HISTORY: family history includes Alcohol abuse in her brother and sisters; Bipolar disorder in her son; Depression in her sister; Diabetes in her maternal grandmother and mother; Drug abuse in her brother and sisters; Hypertension in her mother; Leukemia in her mother; Obesity in her mother; and Stroke in her maternal grandmother.  There is no history of Colon cancer.  SOCIAL HISTORY:  reports that she has been smoking Cigarettes.  She has been smoking about 0.50 packs per day. She has never used smokeless tobacco. She reports that  drinks alcohol. She reports that she does not use illicit drugs. she works in the Engineer, petroleum at [employer].  ALLERGIES: Bupropion hcl; Hydrocod polst-cpm polst er; Lisinopril; and Naproxen sodium  MEDICATIONS:  Current Outpatient Prescriptions  Medication Sig Dispense Refill  . Acetaminophen (TYLENOL ARTHRITIS PAIN PO) Take 2 tablets by mouth as needed.      Marland Kitchen acyclovir (ZOVIRAX) 5 % ointment Apply topically as needed.       Marland Kitchen aspirin 81 MG chewable tablet Chew 81 mg by mouth daily.      Marland Kitchen atorvastatin (LIPITOR) 10 MG tablet Take 1 tablet (10 mg total) by mouth daily.  90 tablet  1  . calcium carbonate (TUMS - DOSED IN MG ELEMENTAL CALCIUM) 500 MG chewable tablet Chew by mouth as directed.        . cyclobenzaprine (FLEXERIL) 10 MG tablet Take 10 mg by mouth as needed.       Marland Kitchen losartan (COZAAR) 50 MG tablet Take 1 tablet (50 mg total) by mouth daily.  30 tablet  11  . omeprazole (PRILOSEC) 40 MG capsule Take 1  capsule (40 mg total) by mouth 2 (two) times daily.  60 capsule  11  . traMADol (ULTRAM) 50 MG tablet Take 1 tablet (50 mg total) by mouth every 8 (eight) hours as needed.  30 tablet  0  . varenicline (CHANTIX) 0.5 MG tablet Take 1 tablet (0.5 mg total) by mouth 2 (two) times daily.  60 tablet  3   No current facility-administered medications for this  encounter.    REVIEW OF SYSTEMS:  A 15 point review of systems is documented in the electronic medical record. This was obtained by the nursing staff. However, I reviewed this with the patient to discuss relevant findings and make appropriate changes.  Prior to biopsy the patient denied any pain in either breast nipple discharge or bleeding.  She denies any new bony pain headaches dizziness or blurred vision.   PHYSICAL EXAM: This is a very pleasant 52 year old female in no acute distress. She is accompanied by her daughter evaluation today who is a oncology nurse, inpatient service, at [facility]. Examination of the neck and supraclavicular region reveals no evidence of adenopathy. The axillary areas are free of adenopathy. Examination of the left breast reveals a small scar in the medial aspect from her recent biopsy. There is no dominant mass appreciated breast nipple discharge or bleeding. The breast is large and pendulous. Examination of the right breast reveals some bruising laterally with associated thickening. There is no dominant mass appreciated in the breast nipple discharge or bleeding.  The right breast is also large and pendulous.   LABORATORY DATA:  Lab Results  Component Value Date   WBC 10.6* 11/04/2012   HGB 13.4 11/04/2012   HCT 40.0 11/04/2012   MCV 93.4 11/04/2012   PLT 259 11/04/2012   Lab Results  Component Value Date   NA 140 08/24/2012   K 4.3 08/24/2012   CL 104 08/24/2012   CO2 29 08/24/2012   Lab Results  Component Value Date   ALT 18 08/24/2012   AST 15 08/24/2012   ALKPHOS 44 08/24/2012   BILITOT 0.7 08/24/2012     RADIOGRAPHY: Dg Esophagus  10/16/2012  *RADIOLOGY REPORT*  Clinical Data: Dysphagia.  Cough and vomiting.  History of reflux.  ESOPHOGRAM/BARIUM SWALLOW  Technique:  Combined double contrast and single contrast examination performed using effervescent crystals, thick barium liquid, and thin barium liquid.  Fluoroscopy time:   2.3 minutes.  Comparison:  None.  Findings:  Oral pharyngeal portion exam is remarkable for a mildly prominent cricopharyngeus muscle.  Double contrast evaluation of the esophagus demonstrates no mucosal abnormalities.  Evaluation of primary peristalsis demonstrates incomplete primary peristaltic wave with contrast stasis in the mid and lower thoracic esophagus.  Full column evaluation of the esophagus demonstrates no persistent narrowing or stricture.  A small hiatal hernia  13 mm barium tablet passes promptly.  IMPRESSION:  1.  Mild nonspecific esophageal dysmotility.  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 2.  Small hiatal hernia.   Original Report Authenticated By: Jeronimo Greaves, M.D.    US Breast Bilateral  10/22/2012  *RADIOLOGY REPORT*  Clinical Data:  Patient presents for additional views of both breasts as follow-up to recent screening exam suggesting bilateral masses.  DIGITAL DIAGNOSTIC BILATERAL MAMMOGRAM  AND BILATERAL BREAST ULTRASOUND:  Comparison:  03/06/2010 and 02/23/2010  Findings:  ACR Breast Density Category 2: There is a scattered fibroglandular pattern.  Spot compression images of the right breast demonstrate an ill- defined 2 cm mass with a few associated well-defined microcalcifications over  the upper outer quadrant.  Spot compression images of the left breast demonstrate an ill-defined spiculated 8 mm mass centrally.  Ultrasound is performed, showing an irregular bordered somewhat heterogeneous hypoechoic solid mass at the 10 o'clock position of the right breast 6 cm from the nipple measuring 1.1 x 1.3 x 1.4 cm. Also in this location is an adjacent small irregular hypoechoic mass measuring 3 x 5 x 6 mm.  This smaller mass is 1 cm from the larger more irregular mass.  Ultrasound of the right axilla demonstrates a single lymph node with thickened cortex.  Other normal-appearing lymph nodes are seen.  Ultrasound of the left breast demonstrates an ill-defined hypoechoic mass with distal acoustic  shadowing at the seven to eight o'clock position 3 cm from the nipple located deep and measuring all of 5 x 7 x 8 mm.  Ultrasound of the left axilla demonstrates normal appearing lymph nodes.  IMPRESSION: Two adjacent irregular suspicious hypoechoic solid masses in the right breast at the 10 o'clock position 6 cm from the nipple with the larger measuring 1.1 x 1.3 x 1.4 cm and the smaller measuring 3 x 5 x 6 mm concerning for malignancy. Single abnormal right axillary lymph node with thickened cortex.  Suspicious irregular hypoechoic mass at the seven to eight o'clock position of the left breast 3 cm from the nipple measuring 5 x 7 x 8 mm.  RECOMMENDATION: Would recommend ultrasound-guided core biopsy of the the larger mass at the 10 o'clock position of the right breasts as well as the abnormal right axillary lymph node.  Also recommend ultrasound core biopsy of the suspicious 8 mm mass over the central left breast.  I have discussed the findings and recommendations with the patient. Results were also provided in writing at the conclusion of the visit.  BI-RADS CATEGORY 5:  Highly suggestive of malignancy - appropriate action should be taken.  Biopsy scheduled for Tuesday 10/27/2012 and 830am.   Original Report Authenticated By: Elberta Fortis, M.D.    Mr Breast Bilateral W Wo Contrast  11/03/2012  *RADIOLOGY REPORT*  Clinical Data: Recently diagnosed bilateral invasive ductal carcinoma.  Preoperative evaluation.  BUN and creatinine were obtained on site at [facility] at [address] Results:  BUN 13 mg/dL,  Creatinine 0.8 mg/dL.  BILATERAL BREAST MRI WITH AND WITHOUT CONTRAST  Technique: Multiplanar, multisequence MR images of both breasts were obtained prior to and following the intravenous administration of 17ml of Multihance.  Three dimensional images were evaluated at the independent DynaCad workstation.  Comparison:  10/27/2012, 10/22/2012, 09/28/2012, 02/23/2010.  Findings: There is minimal  bilateral background parenchymal enhancement.  Within the middle third of the right breast located at the 10 o'clock position (upper-outer quadrant) is an irregular enhancing mass with associated central clip artifact measuring 1.5 x 1.0 x 1.7 cm in size. This is associated with a mixture of plateau and washout enhancement kinetics.  This corresponds to the recently diagnosed invasive ductal carcinoma.  In addition, there are two adjacent enhancing satellite nodules with predominately washout enhancement kinetics separated from the larger mass by approximately 5 mm.  These measure 7 and 8 mm in size.  There is also a smaller 4 mm area of irregular enhancement located 5 mm anterior to the most anterior satellite nodule which is also an area of worrisome enhancement.  In aggregate, these areas of enhancement measure 3.8 cm.  There are no additional worrisome enhancing foci within the right breast.  Within the central middle one  third of the left breast is an irregular enhancing mass with adjacent clip artifact corresponding to the recently diagnosed invasive ductal carcinoma.  This is associated with plateau enhancement kinetics.  This measures 8 x 7 x 7 mm in size.  There are no additional worrisome enhancing foci within the left breast.  There is no evidence for axillary or internal mammary adenopathy and there are no additional findings.  IMPRESSION:  1.  1.7 cm irregular enhancing mass located within the upper-outer quadrant of the right breast with three adjacent worrisome enhancing satellite nodules as discussed above.  In aggregate, the nodules and mass measures 3.8 cm in greatest diameter. 2.  8 mm irregular enhancing mass located within the central left breast corresponding to the recently diagnosed invasive ductal carcinoma. 3.  No evidence for axillary or internal mammary adenopathy and no additional findings.  RECOMMENDATION: Treatment plan  THREE-DIMENSIONAL MR IMAGE RENDERING ON INDEPENDENT WORKSTATION:   Three-dimensional MR images were rendered by post-processing of the original MR data on an independent workstation.  The three- dimensional MR images were interpreted, and findings were reported in the accompanying complete MRI report for this study.  BI-RADS CATEGORY 6:  Known biopsy-proven malignancy - appropriate action should be taken.   Original Report Authenticated By: Rolla Plate, M.D.      Mm Digital Diagnostic Bilat  10/27/2012  *RADIOLOGY REPORT*  Clinical Data:  Bilateral breast biopsies were performed today. These included a palpable mass 10 o'clock position of the right breast, and a nonpalpable mass in the 9 o'clock position of the left breast.  DIGITAL DIAGNOSTIC BILATERAL MAMMOGRAM  Comparison:  Previous exams.  Findings:  Films are performed following ultrasound guided biopsies of the right breast mass at 10 o'clock position and a left breast mass at 9 o'clock position.  A ribbon-shaped biopsy clip is satisfactorily positioned within the irregular mass in the upper outer quadrant of the right breast.  A coil-shaped biopsy clip is satisfactorily positioned along the anterior and medial margin of a suspicious mass in the central left breast, just medial to the level the nipple, in the 9 o'clock position periareolar.  IMPRESSION: Satisfactory position of biopsy clips in both breasts.   Original Report Authenticated By: Britta Mccreedy, M.D.    Mm Digital Diagnostic Bilat  10/22/2012  *RADIOLOGY REPORT*  Clinical Data:  Patient presents for additional views of both breasts as follow-up to recent screening exam suggesting bilateral masses.  DIGITAL DIAGNOSTIC BILATERAL MAMMOGRAM  AND BILATERAL BREAST ULTRASOUND:  Comparison:  03/06/2010 and 02/23/2010  Findings:  ACR Breast Density Category 2: There is a scattered fibroglandular pattern.  Spot compression images of the right breast demonstrate an ill- defined 2 cm mass with a few associated well-defined microcalcifications over the upper outer  quadrant.  Spot compression images of the left breast demonstrate an ill-defined spiculated 8 mm mass centrally.  Ultrasound is performed, showing an irregular bordered somewhat heterogeneous hypoechoic solid mass at the 10 o'clock position of the right breast 6 cm from the nipple measuring 1.1 x 1.3 x 1.4 cm. Also in this location is an adjacent small irregular hypoechoic mass measuring 3 x 5 x 6 mm.  This smaller mass is 1 cm from the larger more irregular mass.  Ultrasound of the right axilla demonstrates a single lymph node with thickened cortex.  Other normal-appearing lymph nodes are seen.  Ultrasound of the left breast demonstrates an ill-defined hypoechoic mass with distal acoustic shadowing at the seven to eight o'clock position 3 cm  from the nipple located deep and measuring all of 5 x 7 x 8 mm.  Ultrasound of the left axilla demonstrates normal appearing lymph nodes.  IMPRESSION: Two adjacent irregular suspicious hypoechoic solid masses in the right breast at the 10 o'clock position 6 cm from the nipple with the larger measuring 1.1 x 1.3 x 1.4 cm and the smaller measuring 3 x 5 x 6 mm concerning for malignancy. Single abnormal right axillary lymph node with thickened cortex.  Suspicious irregular hypoechoic mass at the seven to eight o'clock position of the left breast 3 cm from the nipple measuring 5 x 7 x 8 mm.  RECOMMENDATION: Would recommend ultrasound-guided core biopsy of the the larger mass at the 10 o'clock position of the right breasts as well as the abnormal right axillary lymph node.  Also recommend ultrasound core biopsy of the suspicious 8 mm mass over the central left breast.  I have discussed the findings and recommendations with the patient. Results were also provided in writing at the conclusion of the visit.  BI-RADS CATEGORY 5:  Highly suggestive of malignancy - appropriate action should be taken.  Biopsy scheduled for Tuesday 10/27/2012 and 830am.   Original Report Authenticated By:  Elberta Fortis, M.D.    Mm Radiologist Eval And Mgmt  10/28/2012  *RADIOLOGY REPORT*  ESTABLISHED PATIENT OFFICE VISIT - LEVEL II [phone])  Chief Complaint:  The patient returns for discussion of the pathology report after ultrasound-guided core needle biopsy of a mass in each breast.  History:  The patient underwent screening mammography on 09/24/2012. A mass was noted in each breast and she was recalled for further evaluation which was performed on 10/22/2012.  A mass in each breast was biopsied with ultrasound guidance.  Exam:  On physical examination, there is mild ecchymosis surrounding the right biopsy site.  There is no sign of hematoma or infection.  The left biopsy site is clean and dry with no sign of hematoma or infection.  Pathology: Histologic evaluation of the mass on the right demonstrates invasive ductal carcinoma.  Histologic evaluation of the mass on the left demonstrates invasive ductal carcinoma.  These findings are concordant with the imaging findings.  Assessment and Plan:  Results were discussed with the patient and her daughter.  The patient was scheduled to be evaluated in the [facility] on 11/04/2012.  She is scheduled for breast MRI on 11/03/2012.  Questions were answered.  Informational materials were given.   Original Report Authenticated By: Cain Saupe, M.D.    [place] Lt Breast Bx W Loc Dev 1st Lesion Img Bx Spec US Guide  10/27/2012  *RADIOLOGY REPORT*  Clinical Data:  Suspicious mass identified in the left breast on recent mammogram and ultrasound.  Today, I visualize this mass at 9 o'clock position in the left breast approximately 3 cm from the nipple.  ULTRASOUND GUIDED VACUUM ASSISTED CORE BIOPSY OF THE LEFT BREAST  Comparison: Previous exams.  I met with the patient and we discussed the procedure of ultrasound- guided biopsy, including benefits and alternatives.  We discussed the high likelihood of a successful procedure. We discussed the risks  of the procedure including infection, bleeding, tissue injury, clip migration, and inadequate sampling.  Informed written consent was given.  Using sterile technique, 2% lidocaine ultrasound guidance and a 12 gauge vacuum assisted needle biopsy was performed of a an approximately 8 mm left breast mass at 9 o'clock position using a lateral to medial approach. After the first biopsy pass,  the mass became more difficult to visualize.  A second tissue sample was taken.  At the conclusion of the procedure, a coil-shaped tissue marker clip was deployed into the biopsy cavity.  Follow-up 2-view mammogram was performed and dictated separately.  IMPRESSION: Ultrasound-guided biopsy of left breast mass at 9 o'clock position. No apparent complications.   Original Report Authenticated By: Britta Mccreedy, M.D.    [place] Rt Breast Bx W Loc Dev 1st Lesion Img Bx Spec US Guide  10/27/2012  *RADIOLOGY REPORT*  Clinical Data:  The patient presents today for biopsy of a 1.3 cm palpable mass 10 o'clock position of the right breast and right axillary lymph node with a thickened cortex.  Prior to the procedure today, I have carefully ultrasound that has the right axilla.  I do not appreciate a right axillary lymph node on today's ultrasound which is enlarged or has worrisome features.  Therefore, right axillary lymph node biopsy was not performed.  The study describes the biopsy of the right breast mass at 10 o'clock position.  ULTRASOUND GUIDED VACUUM ASSISTED CORE BIOPSY OF THE RIGHT BREAST  Comparison: Previous exams.  I met with the patient and we discussed the procedure of ultrasound- guided biopsy, including benefits and alternatives.  We discussed the high likelihood of a successful procedure. We discussed the risks of the procedure including infection, bleeding, tissue injury, clip migration, and inadequate sampling.  Informed written consent was given.  Using sterile technique, 2% lidocaine ultrasound guidance and a 12 gauge vacuum  assisted needle biopsy was performed of a 1.3 cm irregular solid mass at 10 o'clock position approximately 6 cm from the nipple using a lateral to medial approach.  At the conclusion of the procedure, a a ribbon-shaped tissue marker clip was deployed into the biopsy cavity.  Follow-up 2-view mammogram was performed and dictated separately.  IMPRESSION: Ultrasound-guided biopsy of right breast mass at 10 o'clock position.  No apparent complications.   Original Report Authenticated By: Britta Mccreedy, M.D.       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 Tight cluster of multiple nodules, 3.8 cm in anterior to posterior dimension. This is a localized finding and she is a candidate for partial mastectomy. Clinical stage T2,N0. The patient would be at increased risk for local recurrence however in light of her multifocal disease in this area,  assuming biopsy of the satellite lesions confirms malignancy.  Invasive ductal carcinoma of the left breast, central, lower inner quadrant, 8 mm, clinical stage T1b., N0, receptor positive, HER-2-negative. Also a candidate for breast conservation.    the patient would be a candidate for genetic counseling and testing in light of her bilateral disease and age. This would likely delay her immediate surgery and the patient does not wish to await these results prior to proceeding with initial surgical intervention.   PLAN: The patient will proceed with bilateral partial mastectomy, bilateral sentinel node biopsy, Port-A-Cath insertion. She will need bracketing the along the right side to ensure complete removal of the primary lesion and satellite nodules.  Patient will then proceed with adjuvant chemotherapy followed by radiation therapy as part of breast conserving treatment.  Patient does live in the [place] area and has expressed a desire to receive her radiation therapy at the [facility].  She will make her  final decision concerning radiation facility location when she has completed her adjuvant chemotherapy. ------------------------------------------------ -----------------------------------  Billie Lade, PhD, MD

## 2012-11-04 NOTE — Progress Notes (Signed)
Patient ID: Amy Leonard, female   DOB: 25-Nov-1960, 52 y.o.   MRN: [id number]  No chief complaint on file.   HPI Amy Leonard is a 52 y.o. female.  She was referred by Dr. Britta Mccreedy at the breast center of [facility] for evaluation and management of bilateral breast cancer.  She is being seen in the [facility] today by Dr. Welton Flakes, Dr. Roselind Messier, and me. Dr. Vinnie Langton is her primary care physician.  This patient has no prior history of breast problems. Last mammogram 2 years ago. Recent screening mammograms and ultrasound showed bilateral abnormalities. In the right breast there are 2 adjacent irregular masses up at the 10:00 position. 1.4 cm and 6 mm. There was a single thickened lymph node. On the left side there was an 8 mm mass at the 7:30 position. Biopsy of the largest mass on the right shows invasive ductal carcinoma,   triple negative tumor, Ki-67 92%. Biopsy of the nodule in the left breast reveals Invasive ductal carcinoma,   Her-2 negative, ER 100%, PR 25%, Ki-67 9%. On reevaluation of the right axillary lymph node it did not look abnormal and was not biopsied.  Subsequent MRI shows what looks like a 1.7 cm mass in the upper-outer quadrant of the right breast with 2 or 3 satellite nodules immediately adjacent to this. I discussed this with Anselmo Pickler and with Britta Mccreedy at the [facility] and  they stated the anterior-posterior extent of this is about 3.8 cm and can probably be bracketed with 2 wires.  A single wire should suffice on the left.  The MRI showed an 8 mm mass in the left breast centrally, a solitary finding. There was no adenopathy seen.  Family history reveals that she has 4 sisters older than her, no breast cancer. Family history also negative for ovarian cancer.  The patient's comorbidities include a current tobacco abuse, Barrett's esophagus, mini stroke seen on CT brain without deficit, depression, hypertension, hyperlipidemia, obesity.  She is here with her daughter who is  an oncology nurse at [facility] [facility] [employer]. She works in the [employer] of [employer] in [place]. She lives in [place]. She has a boyfriend.    HPI  Past Medical History  Diagnosis Date  . Elevated WBC count     nl diff  . Chronic back pain     spinal stenosis  . Tobacco abuse   . Anxiety and depression   . HSV infection     recurrent (side and buttox)  . Migraine   . Glaucoma(365)     ?  Marland Kitchen Carpal tunnel syndrome   . HLD (hyperlipidemia)   . Folliculitis 05/05/2011  . Anxiety   . Depression   . 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 GERD (gastroesophageal reflux disease)   . Hypertension   . Breast cancer     Past Surgical History  Procedure Laterality Date  . Btl    . Epidural steroid injection  2008    Family History  Problem Relation Age of Onset  . Stroke Maternal Grandmother   . Diabetes Mother   . Hypertension Mother   . Leukemia Mother   . Obesity Mother   . Depression Sister   . Bipolar disorder Son     Bipolar / oppositional defiant  . Alcohol abuse Sister   . Drug abuse Sister   . Drug abuse Brother   . Alcohol abuse Brother   . Alcohol abuse Sister   . Drug abuse  Sister   . Colon cancer Neg Hx   . Diabetes Maternal Grandmother     Social History History  Substance Use Topics  . Smoking status: Current Every Day Smoker -- 0.50 packs/day    Types: Cigarettes  . Smokeless tobacco: Never Used     Comment: 1 1/2 ppd -form given 10-07-12  . Alcohol Use: Yes     Comment: 4-5 beers fri and sat night-does this rarely    Allergies  Allergen Reactions  . Bupropion Hcl Nausea Only and Other (See Comments)     GI, sleepy  . Hydrocod Polst-Cpm Polst Er Nausea And Vomiting    vomiting  . Lisinopril Other (See Comments)    Leg cramps    . Naproxen Sodium Other (See Comments)    does not tolerate    Current Outpatient Prescriptions  Medication Sig Dispense Refill  . Acetaminophen (TYLENOL ARTHRITIS PAIN PO) Take 2 tablets  by mouth as needed.      Marland Kitchen acyclovir (ZOVIRAX) 5 % ointment Apply topically as needed.       Marland Kitchen aspirin 81 MG chewable tablet Chew 81 mg by mouth daily.      Marland Kitchen atorvastatin (LIPITOR) 10 MG tablet Take 1 tablet (10 mg total) by mouth daily.  90 tablet  1  . calcium carbonate (TUMS - DOSED IN MG ELEMENTAL CALCIUM) 500 MG chewable tablet Chew by mouth as directed.        . cyclobenzaprine (FLEXERIL) 10 MG tablet Take 10 mg by mouth as needed.       Marland Kitchen losartan (COZAAR) 50 MG tablet Take 1 tablet (50 mg total) by mouth daily.  30 tablet  11  . omeprazole (PRILOSEC) 40 MG capsule Take 1 capsule (40 mg total) by mouth 2 (two) times daily.  60 capsule  11  . traMADol (ULTRAM) 50 MG tablet Take 1 tablet (50 mg total) by mouth every 8 (eight) hours as needed.  30 tablet  0  . varenicline (CHANTIX) 0.5 MG tablet Take 1 tablet (0.5 mg total) by mouth 2 (two) times daily.  60 tablet  3   No current facility-administered medications for this visit.    Review of Systems Review of Systems  Constitutional: Negative for fever, chills and unexpected weight change.  HENT: Negative for hearing loss, congestion, sore throat, trouble swallowing and voice change.   Eyes: Negative for visual disturbance.  Respiratory: Negative for cough and wheezing.   Cardiovascular: Negative for chest pain, palpitations and leg swelling.  Gastrointestinal: Negative for nausea, vomiting, abdominal pain, diarrhea, constipation, blood in stool, abdominal distention and anal bleeding.  Genitourinary: Negative for hematuria, vaginal bleeding and difficulty urinating.  Musculoskeletal: Positive for back pain. Negative for arthralgias.  Skin: Negative for rash and wound.  Neurological: Negative for seizures, syncope and headaches.  Hematological: Negative for adenopathy. Does not bruise/bleed easily.  Psychiatric/Behavioral: Negative for confusion. The patient is nervous/anxious.     There were no vitals taken for this  visit.  Physical Exam Physical Exam  Constitutional: She is oriented to person, place, and time. She appears well-developed and well-nourished. No distress.  HENT:  Head: Normocephalic and atraumatic.  Nose: Nose normal.  Mouth/Throat: No oropharyngeal exudate.  Eyes: Conjunctivae and EOM are normal. Pupils are equal, round, and reactive to light. Left eye exhibits no discharge. No scleral icterus.  Neck: Neck supple. No JVD present. No tracheal deviation present. No thyromegaly present.  Cardiovascular: Normal rate, regular rhythm, normal heart sounds and intact distal pulses.  No murmur heard. Pulmonary/Chest: Effort normal and breath sounds normal. No respiratory distress. She has no wheezes. She has no rales. She exhibits no tenderness.  Breasts are large. Bruising and small palpable thickening at 10:00 right breast. No axillary adenopathy. Bruise medially left breast. No palpable mass. No adenopathy. Nipple and areolar complexes looked normal.  Abdominal: Soft. Bowel sounds are normal. She exhibits no distension and no mass. There is no tenderness. There is no rebound and no guarding.  Musculoskeletal: She exhibits no edema and no tenderness.  Lymphadenopathy:    She has no cervical adenopathy.  Neurological: She is alert and oriented to person, place, and time. She exhibits normal muscle tone. Coordination normal.  Skin: Skin is warm. No rash noted. She is not diaphoretic. No erythema. No pallor.  Psychiatric: She has a normal mood and affect. Her behavior is normal. Judgment and thought content normal.    Data Reviewed I have reviewed her imaging studies, histology, and breast diagnostic profile at breast conferences morning during our discussion. I have collaborated with Dr. Welton Flakes and Dr. Roselind Messier to develop her treatment plan.  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 Tight cluster of multiple nodules, 3.8 cm in anterior to  posterior dimension. This is a localized finding and she is a candidate for partial mastectomy. Clinical stage T2,N0.  Invasive adenocarcinoma left breast, central, lower inner quadrant, 8 mm, clinical stage T1b., N0, receptor positive, HER-2-negative. Also a candidate for breast conservation.  Degenerative disc disease  Hypertension  Depression  Hyperlipidemia  Barrett's esophagus, followed by Dr. Russella Dar  Current tobacco abuse.  Mild obesity    Plan    I have discussed all of her options with her, including a lumpectomy,  SLN biopsy, mastectomy with reconstruction, and Port-A-Cath insertion. After lengthy discussion with me and with Dr. Welton Flakes, she would like to go ahead with bilateral partial mastectomy, bilateral SLN biopsy, and Port-A-Cath insertion.  Because of her triple negative breast cancer on the right, she will need adjuvant chemotherapy, which has been discussed with the patient  We have discussed genetic counseling and genetic testing with the patient. She is interested in this. Unfortunately appointments are scheduled tooo far out and it would be the end of April before we would have the result. That is unacceptable to all concerned. I do not believe that proceeding with the above-described plan in the absence of genetic testing would compromise her care or outcomes. All this has been exposed to the patient and she agrees.  She will be scheduled for bilateral partial mastectomy, bracketed needle on the right, single-needle local the left, bilateral sentinel biopsy, and Port-A-Cath insertion.  I discussed the indications, details, techniques, and numerous risks of all these procedures with her. She understands all of these issues. All of her questions are answered. Her daughter is with her throughout this discussion and has good insight as well. Both the patient and her daughter agree with this plan.       Angelia Mould. Derrell Lolling, M.D., [facility] General and Minimally invasive Surgery Breast and Colorectal Surgery Office:   [phone] Pager:   [phone]  11/04/2012, 3:46 PM

## 2012-11-04 NOTE — Progress Notes (Signed)
Checked in new pt with no financial concerns. °

## 2012-11-04 NOTE — Patient Instructions (Signed)
You have been diagnosed with a breast cancer in the upper outer quadrant of the right breast, possibly as large as 3.8 cm. You have also been diagnosed with a smaller breast cancer in the central portion of the left breast.  You will be scheduled for bilateral partial mastectomies with needle localization, bilateral sentinel lymph node biopsies, and Port-A-Cath insertion.  Dr. Jacinto Halim office will call you tomorrow to arrange and schedule this surgery.      Lumpectomy, Breast Conserving Surgery A lumpectomy is breast surgery that removes only part of the breast. Another name used may be partial mastectomy. The amount removed varies. Make sure you understand how much of your breast will be removed. Reasons for a lumpectomy:  Any solid breast mass.  Grouped significant nodularity that may be confused with a solitary breast mass. Lumpectomy is the most common form of breast cancer surgery today. The surgeon removes the portion of your breast which contains the tumor (cancer). This is the lump. Some normal tissue around the lump is also removed to be sure that all the tumor has been removed.  If cancer cells are found in the margins where the breast tissue was removed, your surgeon will do more surgery to remove the remaining cancer tissue. This is called re-excision surgery. Radiation and/or chemotherapy treatments are often given following a lumpectomy to kill any cancer cells that could possibly remain.  REASONS YOU MAY NOT BE ABLE TO HAVE BREAST CONSERVING SURGERY:  The tumor is located in more than one place.  Your breast is small and the tumor is large so the breast would be disfigured.  The entire tumor removal is not successful with a lumpectomy.  You cannot commit to a full course of chemotherapy, radiation therapy or are pregnant and cannot have radiation.  You have previously had radiation to the breast to treat cancer. HOW A LUMPECTOMY IS PERFORMED If overnight nursing is not  required following a biopsy, a lumpectomy can be performed as a same-day surgery. This can be done in a hospital, clinic, or surgical center. The anesthesia used will depend on your surgeon. They will discuss this with you. A general anesthetic keeps you sleeping through the procedure. LET YOUR CAREGIVERS KNOW ABOUT THE FOLLOWING:  Allergies  Medications taken including herbs, eye drops, over the counter medications, and creams.  Use of steroids (by mouth or creams)  Previous problems with anesthetics or Novocaine.  Possibility of pregnancy, if this applies  History of blood clots (thrombophlebitis)  History of bleeding or blood problems.  Previous surgery  Other health problems BEFORE THE PROCEDURE You should be present one hour prior to your procedure unless directed otherwise.  AFTER THE PROCEDURE  After surgery, you will be taken to the recovery area where a nurse will watch and check your progress. Once you're awake, stable, and taking fluids well, barring other problems you will be allowed to go home.  Ice packs applied to your operative site may help with discomfort and keep the swelling down.  A small rubber drain may be placed in the breast for a couple of days to prevent a hematoma from developing in the breast.  A pressure dressing may be applied for 24 to 48 hours to prevent bleeding.  Keep the wound dry.  You may resume a normal diet and activities as directed. Avoid strenuous activities affecting the arm on the side of the biopsy site such as tennis, swimming, heavy lifting (more than 10 pounds) or pulling.  Bruising in  the breast is normal following this procedure.  Wearing a bra - even to bed - may be more comfortable and also help keep the dressing on.  Change dressings as directed.  Only take over-the-counter or prescription medicines for pain, discomfort, or fever as directed by your caregiver. Call for your results as instructed by your surgeon. Remember  it is your responsibility to get the results of your lumpectomy if your surgeon asked you to follow-up. Do not assume everything is fine if you have not heard from your caregiver. SEEK MEDICAL CARE IF:   There is increased bleeding (more than a small spot) from the wound.  You notice redness, swelling, or increasing pain in the wound.  Pus is coming from wound.  An unexplained oral temperature above 102 F (38.9 C) develops.  You notice a foul smell coming from the wound or dressing. SEEK IMMEDIATE MEDICAL CARE IF:   You develop a rash.  You have difficulty breathing.  You have any allergic problems. Document Released: 09/23/2006 Document Revised: 11/04/2011 Document Reviewed: 12/25/2006 [facility] Patient Information 2013 [place], [place].     Implanted Port Instructions An implanted port is a central line that has a round shape and is placed under the skin. It is used for long-term IV (intravenous) access for:  Medicine.  Fluids.  Liquid nutrition, such as TPN (total parenteral nutrition).  Blood samples. Ports can be placed:  In the chest area just below the collarbone (this is the most common place.)  In the arms.  In the belly (abdomen) area.  In the legs. PARTS OF THE PORT A port has 2 main parts:  The reservoir. The reservoir is round, disc-shaped, and will be a small, raised area under your skin.  The reservoir is the part where a needle is inserted (accessed) to either give medicines or to draw blood.  The catheter. The catheter is a long, slender tube that extends from the reservoir. The catheter is placed into a large vein.  Medicine that is inserted into the reservoir goes into the catheter and then into the vein. INSERTION OF THE PORT  The port is surgically placed in either an operating room or in a procedural area (interventional radiology).  Medicine may be given to help you relax during the procedure.  The skin where the port will be inserted  is numbed (local anesthetic).  1 or 2 small cuts (incisions) will be made in the skin to insert the port.  The port can be used after it has been inserted. INCISION SITE CARE  The incision site may have small adhesive strips on it. This helps keep the incision site closed. Sometimes, no adhesive strips are placed. Instead of adhesive strips, a special kind of surgical glue is used to keep the incision closed.  If adhesive strips were placed on the incision sites, do not take them off. They will fall off on their own.  The incision site may be sore for 1 to 2 days. Pain medicine can help.  Do not get the incision site wet. Bathe or shower as directed by your caregiver.  The incision site should heal in 5 to 7 days. A small scar may form after the incision has healed. ACCESSING THE PORT Special steps must be taken to access the port:  Before the port is accessed, a numbing cream can be placed on the skin. This helps numb the skin over the port site.  A sterile technique is used to access the port.  The port is accessed with a needle. Only "non-coring" port needles should be used to access the port. Once the port is accessed, a blood return should be checked. This helps ensure the port is in the vein and is not clogged (clotted).  If your caregiver believes your port should remain accessed, a clear (transparent) bandage will be placed over the needle site. The bandage and needle will need to be changed every week or as directed by your caregiver.  Keep the bandage covering the needle clean and dry. Do not get it wet. Follow your caregiver's instructions on how to take a shower or bath when the port is accessed.  If your port does not need to stay accessed, no bandage is needed over the port. FLUSHING THE PORT Flushing the port keeps it from getting clogged. How often the port is flushed depends on:  If a constant infusion is running. If a constant infusion is running, the port may not  need to be flushed.  If intermittent medicines are given.  If the port is not being used. For intermittent medicines:  The port will need to be flushed:  After medicines have been given.  After blood has been drawn.  As part of routine maintenance.  A port is normally flushed with:  Normal saline.  Heparin.  Follow your caregiver's advice on how often, how much, and the type of flush to use on your port. IMPORTANT PORT INFORMATION  Tell your caregiver if you are allergic to heparin.  After your port is placed, you will get a manufacturer's information card. The card has information about your port. Keep this card with you at all times.  There are many types of ports available. Know what kind of port you have.  In case of an emergency, it may be helpful to wear a medical alert bracelet. This can help alert health care workers that you have a port.  The port can stay in for as long as your caregiver believes it is necessary.  When it is time for the port to come out, surgery will be done to remove it. The surgery will be similar to how the port was put in.  If you are in the hospital or clinic:  Your port will be taken care of and flushed by a nurse.  If you are at home:  A home health care nurse may give medicines and take care of the port.  You or a family member can get special training and directions for giving medicine and taking care of the port at home. SEEK IMMEDIATE MEDICAL CARE IF:   Your port does not flush or you are unable to get a blood return.  New drainage or pus is coming from the incision.  A bad smell is coming from the incision site.  You develop swelling or increased redness at the incision site.  You develop increased swelling or pain at the port site.  You develop swelling or pain in the surrounding skin near the port.  You have an oral temperature above 102 F (38.9 C), not controlled by medicine. MAKE SURE YOU:   Understand these  instructions.  Will watch your condition.  Will get help right away if you are not doing well or get worse. Document Released: 08/12/2005 Document Revised: 11/04/2011 Document Reviewed: 11/03/2008 [facility] Patient Information 2013 [place], [place].

## 2012-11-05 ENCOUNTER — Telehealth (INDEPENDENT_AMBULATORY_CARE_PROVIDER_SITE_OTHER): Payer: Self-pay | Admitting: *Deleted

## 2012-11-05 ENCOUNTER — Encounter: Payer: Self-pay | Admitting: Specialist

## 2012-11-05 NOTE — Progress Notes (Signed)
I met Ms. Amy Leonard and her daughter at the multidisciplinary breast clinic on March 12.  She indicated on the screening tool that her distress was a level "7".  She was tearful and was specifically concerned about her finances, saying that she has insurance but is worried about paying co-pays for her treatment.  I was able to connect her with the financial advocate after her appointment to discuss financial matters.  I also encouraged her to attend Breast Cancer Support Group and made a referral for her to [facility].  I will also make a social work referral.

## 2012-11-05 NOTE — Telephone Encounter (Signed)
Called patient to make sure she is aware to stop her Aspirin 81mg  PO 5 days prior to surgery.  Patient states understanding.

## 2012-11-09 ENCOUNTER — Telehealth: Payer: Self-pay | Admitting: *Deleted

## 2012-11-09 NOTE — Telephone Encounter (Signed)
Pt called stating that she decided to go ahead and have surgery and does not need the genetic appt that soon now.  Confirmed rescheduled 5.22.14 genetic appt w/ pt.  Mailed pt her calendars.

## 2012-11-10 ENCOUNTER — Telehealth: Payer: Self-pay | Admitting: *Deleted

## 2012-11-10 NOTE — Telephone Encounter (Signed)
Spoke to pt concerning [facility] from 3/12.  Pt denies questions or concerns regarding dx or treatment care plan.  Confirmed surgery date and future appts.  Encourage pt to call with needs.  Received verbal understanding.  Contact information given.

## 2012-11-13 ENCOUNTER — Encounter: Payer: Self-pay | Admitting: Oncology

## 2012-11-13 NOTE — Progress Notes (Signed)
Put patient's and sister's fmla forms on nurse's desk.

## 2012-11-17 ENCOUNTER — Encounter: Payer: Self-pay | Admitting: Oncology

## 2012-11-17 NOTE — Progress Notes (Signed)
Faxed patient's and daughter's fmla forms to [email or web address] @ [phone].

## 2012-11-18 ENCOUNTER — Encounter (HOSPITAL_BASED_OUTPATIENT_CLINIC_OR_DEPARTMENT_OTHER): Payer: Self-pay | Admitting: *Deleted

## 2012-11-18 NOTE — Progress Notes (Signed)
Pt had cbc cmet-11/04/12 Will come in for ua,ekg,cxr per dr Derrell Lolling Trying to quit smoking-

## 2012-11-18 NOTE — H&P (Signed)
Amy Leonard    MRN:  [id number]   Description: 52 year old female  Provider: Ernestene Mention, MD  Department: [facility]        Diagnoses    Cancer of central portion of female breast, left    -  Primary    174.1    Cancer of upper-outer quadrant of female breast, right        174.4         History and Physical   Ernestene Mention, MD    Status: Signed                         HPI Amy Leonard is a 52 y.o. female.  She was referred by Dr. Britta Mccreedy at the breast center of [facility] for evaluation and management of bilateral breast cancer.  She is being seen in the [facility] today by Dr. Welton Flakes, Dr. Roselind Messier, and me. Dr. Vinnie Langton is her primary care physician.   This patient has no prior history of breast problems. Last mammogram 2 years ago. Recent screening mammograms and ultrasound showed bilateral abnormalities. In the right breast there are 2 adjacent irregular masses up at the 10:00 position. 1.4 cm and 6 mm. There was a single thickened lymph node. On the left side there was an 8 mm mass at the 7:30 position. Biopsy of the largest mass on the right shows invasive ductal carcinoma,   triple negative tumor, Ki-67 92%. Biopsy of the nodule in the left breast reveals Invasive ductal carcinoma,   Her-2 negative, ER 100%, PR 25%, Ki-67 9%. On reevaluation of the right axillary lymph node it did not look abnormal and was not biopsied.   Subsequent MRI shows what looks like a 1.7 cm mass in the upper-outer quadrant of the right breast with 2 or 3 satellite nodules immediately adjacent to this. I discussed this with Anselmo Pickler and with Britta Mccreedy at the [facility] and  they stated the anterior-posterior extent of this is about 3.8 cm and can probably be bracketed with 2 wires.  A single wire should suffice on the left.  The MRI showed an 8 mm mass in the left breast centrally, a solitary finding. There was no adenopathy seen.   Family history reveals  that she has 4 sisters older than her, no breast cancer. Family history also negative for ovarian cancer.   The patient's comorbidities include a current tobacco abuse, Barrett's esophagus, mini stroke seen on CT brain without deficit, depression, hypertension, hyperlipidemia, obesity.   She is here with her daughter who is an oncology nurse at [employer]. She works in the [employer] of [employer] in [place]. She lives in [place]. She has a boyfriend.         Past Medical History   Diagnosis  Date   .  Elevated WBC count         nl diff   .  Chronic back pain         spinal stenosis   .  Tobacco abuse     .  Anxiety and depression     .  HSV infection         recurrent (side and buttox)   .  Migraine     .  Glaucoma(365)         ?   Marland Kitchen  Carpal tunnel syndrome     .  HLD (  hyperlipidemia)     .  Folliculitis  05/05/2011   .  Anxiety     .  Depression     .  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  GERD (gastroesophageal reflux disease)     .  Hypertension     .  Breast cancer           Past Surgical History   Procedure  Laterality  Date   .  Btl       .  Epidural steroid injection    2008         Family History   Problem  Relation  Age of Onset   .  Stroke  Maternal Grandmother     .  Diabetes  Mother     .  Hypertension  Mother     .  Leukemia  Mother     .  Obesity  Mother     .  Depression  Sister     .  Bipolar disorder  Son         Bipolar / oppositional defiant   .  Alcohol abuse  Sister     .  Drug abuse  Sister     .  Drug abuse  Brother     .  Alcohol abuse  Brother     .  Alcohol abuse  Sister     .  Drug abuse  Sister     .  Colon cancer  Neg Hx     .  Diabetes  Maternal Grandmother          Social History History   Substance Use Topics   .  Smoking status:  Current Every Day Smoker -- 0.50 packs/day       Types:  Cigarettes   .  Smokeless tobacco:  Never Used         Comment: 1 1/2 ppd -form given  10-07-12   .  Alcohol Use:  Yes         Comment: 4-5 beers fri and sat night-does this rarely         Allergies   Allergen  Reactions   .  Bupropion Hcl  Nausea Only and Other (See Comments)        GI, sleepy   .  Hydrocod Polst-Cpm Polst Er  Nausea And Vomiting       vomiting   .  Lisinopril  Other (See Comments)       Leg cramps      .  Naproxen Sodium  Other (See Comments)       does not tolerate         Current Outpatient Prescriptions   Medication  Sig  Dispense  Refill   .  Acetaminophen (TYLENOL ARTHRITIS PAIN PO)  Take 2 tablets by mouth as needed.         Marland Kitchen  acyclovir (ZOVIRAX) 5 % ointment  Apply topically as needed.          Marland Kitchen  aspirin 81 MG chewable tablet  Chew 81 mg by mouth daily.         Marland Kitchen  atorvastatin (LIPITOR) 10 MG tablet  Take 1 tablet (10 mg total) by mouth daily.   90 tablet   1   .  calcium carbonate (TUMS - DOSED IN MG ELEMENTAL CALCIUM) 500 MG chewable tablet  Chew by mouth as directed.           Marland Kitchen  cyclobenzaprine (FLEXERIL) 10 MG tablet  Take 10 mg by mouth as needed.          Marland Kitchen  losartan (COZAAR) 50 MG tablet  Take 1 tablet (50 mg total) by mouth daily.   30 tablet   11   .  omeprazole (PRILOSEC) 40 MG capsule  Take 1 capsule (40 mg total) by mouth 2 (two) times daily.   60 capsule   11   .  traMADol (ULTRAM) 50 MG tablet  Take 1 tablet (50 mg total) by mouth every 8 (eight) hours as needed.   30 tablet   0   .  varenicline (CHANTIX) 0.5 MG tablet  Take 1 tablet (0.5 mg total) by mouth 2 (two) times daily.   60 tablet   3       No current facility-administered medications for this visit.        Review of Systems   Constitutional: Negative for fever, chills and unexpected weight change.  HENT: Negative for hearing loss, congestion, sore throat, trouble swallowing and voice change.   Eyes: Negative for visual disturbance.  Respiratory: Negative for cough and wheezing.   Cardiovascular: Negative for chest pain, palpitations and leg swelling.   Gastrointestinal: Negative for nausea, vomiting, abdominal pain, diarrhea, constipation, blood in stool, abdominal distention and anal bleeding.  Genitourinary: Negative for hematuria, vaginal bleeding and difficulty urinating.  Musculoskeletal: Positive for back pain. Negative for arthralgias.  Skin: Negative for rash and wound.  Neurological: Negative for seizures, syncope and headaches.  Hematological: Negative for adenopathy. Does not bruise/bleed easily.  Psychiatric/Behavioral: Negative for confusion. The patient is nervous/anxious.       There were no vitals taken for this visit.   Physical Exam   Constitutional: She is oriented to person, place, and time. She appears well-developed and well-nourished. No distress.  HENT:   Head: Normocephalic and atraumatic.   Nose: Nose normal.   Mouth/Throat: No oropharyngeal exudate.  Eyes: Conjunctivae and EOM are normal. Pupils are equal, round, and reactive to light. Left eye exhibits no discharge. No scleral icterus.  Neck: Neck supple. No JVD present. No tracheal deviation present. No thyromegaly present.  Cardiovascular: Normal rate, regular rhythm, normal heart sounds and intact distal pulses.    No murmur heard. Pulmonary/Chest: Effort normal and breath sounds normal. No respiratory distress. She has no wheezes. She has no rales. She exhibits no tenderness.  Breasts are large. Bruising and small palpable thickening at 10:00 right breast. No axillary adenopathy. Bruise medially left breast. No palpable mass. No adenopathy. Nipple and areolar complexes looked normal.  Abdominal: Soft. Bowel sounds are normal. She exhibits no distension and no mass. There is no tenderness. There is no rebound and no guarding.  Musculoskeletal: She exhibits no edema and no tenderness.  Lymphadenopathy:    She has no cervical adenopathy.  Neurological: She is alert and oriented to person, place, and time. She exhibits normal muscle tone. Coordination  normal.  Skin: Skin is warm. No rash noted. She is not diaphoretic. No erythema. No pallor.  Psychiatric: She has a normal mood and affect. Her behavior is normal. Judgment and thought content normal.      Data Reviewed I have reviewed her imaging studies, histology, and breast diagnostic profile at breast conferences morning during our discussion. I have collaborated with Dr. Welton Flakes and Dr. Roselind Messier to develop her treatment plan.   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 Tight cluster of multiple  nodules, 3.8 cm in anterior to posterior dimension. This is a localized finding and she is a candidate for partial mastectomy. Clinical stage T2,N0.   Invasive adenocarcinoma left breast, central, lower inner quadrant, 8 mm, clinical stage T1b., N0, receptor positive, HER-2-negative. Also a candidate for breast conservation.   Degenerative disc disease   Hypertension   Depression   Hyperlipidemia   Barrett's esophagus, followed by Dr. Russella Dar   Current tobacco abuse.   Mild obesity     Plan    I have discussed all of her options with her, including a lumpectomy,  SLN biopsy, mastectomy with reconstruction, and Port-A-Cath insertion. After lengthy discussion with me and with Dr. Welton Flakes, she would like to go ahead with bilateral partial mastectomy, bilateral SLN biopsy, and Port-A-Cath insertion.   Because of her triple negative breast cancer on the right, she will need adjuvant chemotherapy, which has been discussed with the patient   We have discussed genetic counseling and genetic testing with the patient. She is interested in this. Unfortunately appointments are scheduled too far out and it would be the end of April before we would have the result. That is unacceptable to all concerned. I do not believe that proceeding with the above-described plan in the absence of genetic testing would compromise her care or outcomes. All this  has been exposed to the patient and she agrees.   She will be scheduled for bilateral partial mastectomy, bracketed needle on the right, single-needle local the left, bilateral sentinel biopsy, and Port-A-Cath insertion.   I discussed the indications, details, techniques, and numerous risks of all these procedures with her. She understands all of these issues. All of her questions are answered. Her daughter is with her throughout this discussion and has good insight as well. Both the patient and her daughter agree with this plan.          Angelia Mould. Derrell Lolling, M.D., [facility] General and Minimally invasive Surgery Breast and Colorectal Surgery Office:   [phone] Pager:   [phone]

## 2012-11-19 ENCOUNTER — Ambulatory Visit
Admission: RE | Admit: 2012-11-19 | Discharge: 2012-11-19 | Disposition: A | Discharge status: Home / Self Care | Payer: BC Managed Care – PPO | Source: Ambulatory Visit | Attending: General Surgery | Admitting: General Surgery

## 2012-11-19 ENCOUNTER — Telehealth: Payer: Self-pay | Admitting: Oncology

## 2012-11-19 ENCOUNTER — Other Ambulatory Visit: Payer: Self-pay

## 2012-11-19 ENCOUNTER — Encounter (HOSPITAL_BASED_OUTPATIENT_CLINIC_OR_DEPARTMENT_OTHER)
Admission: RE | Admit: 2012-11-19 | Discharge: 2012-11-19 | Disposition: A | Discharge status: Home / Self Care | Payer: BC Managed Care – PPO | Source: Ambulatory Visit | Attending: General Surgery | Admitting: General Surgery

## 2012-11-19 LAB — URINALYSIS, ROUTINE W REFLEX MICROSCOPIC
Ketones, ur: NEGATIVE mg/dL
Leukocytes, UA: NEGATIVE
Nitrite: NEGATIVE
Protein, ur: NEGATIVE mg/dL

## 2012-11-19 NOTE — Telephone Encounter (Signed)
Pt came in today for schedule and was given appt schedule for April and May including appt for echo/bensimhon 4/2.

## 2012-11-23 ENCOUNTER — Encounter (HOSPITAL_BASED_OUTPATIENT_CLINIC_OR_DEPARTMENT_OTHER): Payer: Self-pay | Admitting: Certified Registered"

## 2012-11-23 ENCOUNTER — Other Ambulatory Visit: Payer: BC Managed Care – PPO | Admitting: Lab

## 2012-11-23 ENCOUNTER — Ambulatory Visit (HOSPITAL_COMMUNITY): Payer: BC Managed Care – PPO

## 2012-11-23 ENCOUNTER — Ambulatory Visit
Admission: RE | Admit: 2012-11-23 | Discharge: 2012-11-23 | Disposition: A | Discharge status: Home / Self Care | Payer: BC Managed Care – PPO | Source: Ambulatory Visit | Attending: General Surgery | Admitting: General Surgery

## 2012-11-23 ENCOUNTER — Ambulatory Visit (HOSPITAL_BASED_OUTPATIENT_CLINIC_OR_DEPARTMENT_OTHER)
Admission: RE | Admit: 2012-11-23 | Discharge: 2012-11-24 | Disposition: A | Discharge status: Home / Self Care | Payer: BC Managed Care – PPO | Source: Ambulatory Visit | Attending: General Surgery | Admitting: General Surgery

## 2012-11-23 ENCOUNTER — Encounter: Payer: Self-pay | Admitting: Oncology

## 2012-11-23 ENCOUNTER — Encounter: Payer: BC Managed Care – PPO | Admitting: Genetic Counselor

## 2012-11-23 ENCOUNTER — Encounter (HOSPITAL_COMMUNITY)
Admission: RE | Admit: 2012-11-23 | Discharge: 2012-11-23 | Disposition: A | Discharge status: Home / Self Care | Payer: BC Managed Care – PPO | Source: Ambulatory Visit | Attending: General Surgery | Admitting: General Surgery

## 2012-11-23 ENCOUNTER — Encounter (HOSPITAL_BASED_OUTPATIENT_CLINIC_OR_DEPARTMENT_OTHER)
Admission: RE | Disposition: A | Discharge status: Home / Self Care | Payer: Self-pay | Source: Ambulatory Visit | Attending: General Surgery

## 2012-11-23 ENCOUNTER — Ambulatory Visit (HOSPITAL_BASED_OUTPATIENT_CLINIC_OR_DEPARTMENT_OTHER): Payer: BC Managed Care – PPO | Admitting: Certified Registered"

## 2012-11-23 DIAGNOSIS — F3289 Other specified depressive episodes: Secondary | ICD-10-CM | POA: Insufficient documentation

## 2012-11-23 DIAGNOSIS — F411 Generalized anxiety disorder: Secondary | ICD-10-CM | POA: Insufficient documentation

## 2012-11-23 DIAGNOSIS — C50119 Malignant neoplasm of central portion of unspecified female breast: Secondary | ICD-10-CM | POA: Diagnosis present

## 2012-11-23 DIAGNOSIS — E785 Hyperlipidemia, unspecified: Secondary | ICD-10-CM | POA: Insufficient documentation

## 2012-11-23 DIAGNOSIS — Z7982 Long term (current) use of aspirin: Secondary | ICD-10-CM | POA: Insufficient documentation

## 2012-11-23 DIAGNOSIS — Z170 Estrogen receptor positive status [ER+]: Secondary | ICD-10-CM | POA: Insufficient documentation

## 2012-11-23 DIAGNOSIS — C50419 Malignant neoplasm of upper-outer quadrant of unspecified female breast: Secondary | ICD-10-CM | POA: Insufficient documentation

## 2012-11-23 DIAGNOSIS — C773 Secondary and unspecified malignant neoplasm of axilla and upper limb lymph nodes: Secondary | ICD-10-CM | POA: Insufficient documentation

## 2012-11-23 DIAGNOSIS — F17200 Nicotine dependence, unspecified, uncomplicated: Secondary | ICD-10-CM | POA: Insufficient documentation

## 2012-11-23 DIAGNOSIS — F329 Major depressive disorder, single episode, unspecified: Secondary | ICD-10-CM | POA: Insufficient documentation

## 2012-11-23 DIAGNOSIS — C50411 Malignant neoplasm of upper-outer quadrant of right female breast: Secondary | ICD-10-CM

## 2012-11-23 DIAGNOSIS — Z79899 Other long term (current) drug therapy: Secondary | ICD-10-CM | POA: Insufficient documentation

## 2012-11-23 DIAGNOSIS — M4800 Spinal stenosis, site unspecified: Secondary | ICD-10-CM | POA: Insufficient documentation

## 2012-11-23 DIAGNOSIS — Z171 Estrogen receptor negative status [ER-]: Secondary | ICD-10-CM | POA: Insufficient documentation

## 2012-11-23 DIAGNOSIS — E669 Obesity, unspecified: Secondary | ICD-10-CM | POA: Insufficient documentation

## 2012-11-23 DIAGNOSIS — D0590 Unspecified type of carcinoma in situ of unspecified breast: Secondary | ICD-10-CM | POA: Insufficient documentation

## 2012-11-23 DIAGNOSIS — C50112 Malignant neoplasm of central portion of left female breast: Secondary | ICD-10-CM

## 2012-11-23 DIAGNOSIS — Z8673 Personal history of transient ischemic attack (TIA), and cerebral infarction without residual deficits: Secondary | ICD-10-CM | POA: Insufficient documentation

## 2012-11-23 DIAGNOSIS — K2270 Barrett's esophagus without dysplasia: Secondary | ICD-10-CM | POA: Insufficient documentation

## 2012-11-23 DIAGNOSIS — I10 Essential (primary) hypertension: Secondary | ICD-10-CM | POA: Insufficient documentation

## 2012-11-23 DIAGNOSIS — H409 Unspecified glaucoma: Secondary | ICD-10-CM | POA: Insufficient documentation

## 2012-11-23 DIAGNOSIS — K219 Gastro-esophageal reflux disease without esophagitis: Secondary | ICD-10-CM | POA: Insufficient documentation

## 2012-11-23 HISTORY — PX: PORTACATH PLACEMENT: SHX2246

## 2012-11-23 HISTORY — PX: PARTIAL MASTECTOMY WITH NEEDLE LOCALIZATION AND AXILLARY SENTINEL LYMPH NODE BX: SHX6009

## 2012-11-23 HISTORY — DX: Presence of dental prosthetic device (complete) (partial): Z97.2

## 2012-11-23 SURGERY — PARTIAL MASTECTOMY WITH NEEDLE LOCALIZATION AND AXILLARY SENTINEL LYMPH NODE BX
Anesthesia: General | Site: Chest | Laterality: Right | Wound class: Clean

## 2012-11-23 MED ORDER — DEXAMETHASONE SODIUM PHOSPHATE 4 MG/ML IJ SOLN
INTRAMUSCULAR | Status: DC | PRN
  Administered 2012-11-23: 10 mg via INTRAVENOUS

## 2012-11-23 MED ORDER — CEFAZOLIN SODIUM-DEXTROSE 2-3 GM-% IV SOLR: 2.0000 g | Freq: Three times a day (TID) | INTRAVENOUS | Status: DC

## 2012-11-23 MED ORDER — MIDAZOLAM HCL 2 MG/2ML IJ SOLN: 0.5000 mg | Freq: Once | INTRAMUSCULAR | Status: DC | PRN

## 2012-11-23 MED ORDER — METHYLENE BLUE 1 % INJ SOLN
INTRAMUSCULAR | Status: DC | PRN
  Administered 2012-11-23: 2 mL

## 2012-11-23 MED ORDER — MORPHINE SULFATE 2 MG/ML IJ SOLN
2.0000 mg | INTRAMUSCULAR | Status: DC | PRN
  Administered 2012-11-23: 2 mg via INTRAVENOUS

## 2012-11-23 MED ORDER — DIPHENHYDRAMINE HCL 50 MG/ML IJ SOLN
12.5000 mg | Freq: Once | INTRAMUSCULAR | Status: AC
  Administered 2012-11-23: 12.5 mg via INTRAVENOUS

## 2012-11-23 MED ORDER — OXYCODONE-ACETAMINOPHEN 5-325 MG PO TABS
1.0000 | ORAL_TABLET | ORAL | Status: DC | PRN
  Administered 2012-11-23 – 2012-11-24 (×4): 2 via ORAL

## 2012-11-23 MED ORDER — PROPOFOL 10 MG/ML IV BOLUS
INTRAVENOUS | Status: DC | PRN
  Administered 2012-11-23: 200 mg via INTRAVENOUS

## 2012-11-23 MED ORDER — BUPIVACAINE-EPINEPHRINE PF 0.5-1:200000 % IJ SOLN
INTRAMUSCULAR | Status: DC | PRN
  Administered 2012-11-23: 30 mL

## 2012-11-23 MED ORDER — HEPARIN SODIUM (PORCINE) 5000 UNIT/ML IJ SOLN
5000.0000 [IU] | Freq: Once | INTRAMUSCULAR | Status: AC
  Administered 2012-11-23: 5000 [IU] via SUBCUTANEOUS

## 2012-11-23 MED ORDER — ONDANSETRON HCL 4 MG PO TABS: 4.0000 mg | ORAL_TABLET | Freq: Four times a day (QID) | ORAL | Status: DC | PRN

## 2012-11-23 MED ORDER — SUFENTANIL CITRATE 50 MCG/ML IV SOLN
INTRAVENOUS | Status: DC | PRN
  Administered 2012-11-23 (×2): 10 ug via INTRAVENOUS
  Administered 2012-11-23: 5 ug via INTRAVENOUS

## 2012-11-23 MED ORDER — HEPARIN (PORCINE) IN NACL 2-0.9 UNIT/ML-% IJ SOLN
INTRAMUSCULAR | Status: DC | PRN
  Administered 2012-11-23: 1 via INTRAVENOUS

## 2012-11-23 MED ORDER — ONDANSETRON HCL 4 MG/2ML IJ SOLN
INTRAMUSCULAR | Status: DC | PRN
  Administered 2012-11-23: 4 mg via INTRAVENOUS

## 2012-11-23 MED ORDER — CHLORHEXIDINE GLUCONATE 4 % EX LIQD: 1.0000 "application " | Freq: Once | CUTANEOUS | Status: DC

## 2012-11-23 MED ORDER — PHENYLEPHRINE HCL 10 MG/ML IJ SOLN
INTRAMUSCULAR | Status: DC | PRN
  Administered 2012-11-23: 40 ug via INTRAVENOUS

## 2012-11-23 MED ORDER — HYDROMORPHONE HCL PF 1 MG/ML IJ SOLN: 0.2500 mg | INTRAMUSCULAR | Status: DC | PRN

## 2012-11-23 MED ORDER — PHENYLEPHRINE HCL 10 MG/ML IJ SOLN
10.0000 mg | INTRAVENOUS | Status: DC | PRN
  Administered 2012-11-23: 40 ug/min via INTRAVENOUS

## 2012-11-23 MED ORDER — LOSARTAN POTASSIUM 50 MG PO TABS
50.0000 mg | ORAL_TABLET | Freq: Every day | ORAL | Status: DC
Start: 2012-11-23 — End: 2012-11-24

## 2012-11-23 MED ORDER — FENTANYL CITRATE 0.05 MG/ML IJ SOLN
50.0000 ug | INTRAMUSCULAR | Status: DC | PRN
  Administered 2012-11-23: 50 ug via INTRAVENOUS

## 2012-11-23 MED ORDER — LACTATED RINGERS IV SOLN
INTRAVENOUS | Status: DC
  Administered 2012-11-23 (×2): via INTRAVENOUS

## 2012-11-23 MED ORDER — ONDANSETRON HCL 4 MG/2ML IJ SOLN: 4.0000 mg | Freq: Four times a day (QID) | INTRAMUSCULAR | Status: DC | PRN

## 2012-11-23 MED ORDER — HEPARIN SODIUM (PORCINE) 5000 UNIT/ML IJ SOLN: 5000.0000 [IU] | Freq: Three times a day (TID) | INTRAMUSCULAR | Status: DC

## 2012-11-23 MED ORDER — CEFAZOLIN SODIUM-DEXTROSE 2-3 GM-% IV SOLR
2.0000 g | INTRAVENOUS | Status: AC
  Administered 2012-11-23: 2 g via INTRAVENOUS

## 2012-11-23 MED ORDER — MIDAZOLAM HCL 2 MG/2ML IJ SOLN
1.0000 mg | INTRAMUSCULAR | Status: DC | PRN
  Administered 2012-11-23: 1 mg via INTRAVENOUS

## 2012-11-23 MED ORDER — MIDAZOLAM HCL 5 MG/5ML IJ SOLN
INTRAMUSCULAR | Status: DC | PRN
  Administered 2012-11-23: 1 mg via INTRAVENOUS

## 2012-11-23 MED ORDER — PROMETHAZINE HCL 25 MG/ML IJ SOLN: 6.2500 mg | INTRAMUSCULAR | Status: DC | PRN

## 2012-11-23 MED ORDER — BUPIVACAINE-EPINEPHRINE 0.5% -1:200000 IJ SOLN
INTRAMUSCULAR | Status: DC | PRN
  Administered 2012-11-23: 10 mL

## 2012-11-23 MED ORDER — EPHEDRINE SULFATE 50 MG/ML IJ SOLN
INTRAMUSCULAR | Status: DC | PRN
  Administered 2012-11-23 (×3): 10 mg via INTRAVENOUS

## 2012-11-23 MED ORDER — SODIUM CHLORIDE 0.9 % IJ SOLN
INTRAMUSCULAR | Status: DC | PRN
  Administered 2012-11-23: 3 mL via INTRAVENOUS

## 2012-11-23 MED ORDER — CEFAZOLIN SODIUM-DEXTROSE 2-3 GM-% IV SOLR
2.0000 g | Freq: Three times a day (TID) | INTRAVENOUS | Status: AC
  Administered 2012-11-23 – 2012-11-24 (×2): 2 g via INTRAVENOUS

## 2012-11-23 MED ORDER — LIDOCAINE HCL (CARDIAC) 20 MG/ML IV SOLN
INTRAVENOUS | Status: DC | PRN
  Administered 2012-11-23: 100 mg via INTRAVENOUS

## 2012-11-23 MED ORDER — OXYCODONE HCL 5 MG/5ML PO SOLN: 5.0000 mg | Freq: Once | ORAL | Status: DC | PRN

## 2012-11-23 MED ORDER — ACETAMINOPHEN 10 MG/ML IV SOLN
1000.0000 mg | Freq: Once | INTRAVENOUS | Status: AC
  Administered 2012-11-23: 1000 mg via INTRAVENOUS

## 2012-11-23 MED ORDER — OXYCODONE HCL 5 MG PO TABS: 5.0000 mg | ORAL_TABLET | Freq: Once | ORAL | Status: DC | PRN

## 2012-11-23 MED ORDER — PANTOPRAZOLE SODIUM 40 MG PO TBEC: 40.0000 mg | DELAYED_RELEASE_TABLET | Freq: Every day | ORAL | Status: DC

## 2012-11-23 MED ORDER — MEPERIDINE HCL 25 MG/ML IJ SOLN: 6.2500 mg | INTRAMUSCULAR | Status: DC | PRN

## 2012-11-23 MED ORDER — POTASSIUM CHLORIDE IN NACL 20-0.9 MEQ/L-% IV SOLN
INTRAVENOUS | Status: DC
  Administered 2012-11-23 – 2012-11-24 (×2): via INTRAVENOUS

## 2012-11-23 MED ORDER — CYCLOBENZAPRINE HCL 10 MG PO TABS
10.0000 mg | ORAL_TABLET | Freq: Three times a day (TID) | ORAL | Status: DC | PRN
  Administered 2012-11-23: 10 mg via ORAL

## 2012-11-23 MED ORDER — TECHNETIUM TC 99M SULFUR COLLOID FILTERED
1.0000 | Freq: Once | INTRAVENOUS | Status: AC | PRN
  Administered 2012-11-23: 1 via INTRADERMAL

## 2012-11-23 MED ORDER — ATORVASTATIN CALCIUM 10 MG PO TABS: 10.0000 mg | ORAL_TABLET | Freq: Every day | ORAL | Status: DC

## 2012-11-23 MED ORDER — HEPARIN SOD (PORK) LOCK FLUSH 100 UNIT/ML IV SOLN
INTRAVENOUS | Status: DC | PRN
  Administered 2012-11-23: 500 [IU] via INTRAVENOUS

## 2012-11-23 SURGICAL SUPPLY — 75 items
ADH SKN CLS APL DERMABOND .7 (GAUZE/BANDAGES/DRESSINGS) ×6
APL SKNCLS STERI-STRIP NONHPOA (GAUZE/BANDAGES/DRESSINGS)
APPLIER CLIP 11 MED OPEN (CLIP) ×3
APR CLP MED 11 20 MLT OPN (CLIP) ×2
BAG DECANTER FOR FLEXI CONT (MISCELLANEOUS) ×3 IMPLANT
BANDAGE ELASTIC 6 VELCRO ST LF (GAUZE/BANDAGES/DRESSINGS) IMPLANT
BENZOIN TINCTURE PRP APPL 2/3 (GAUZE/BANDAGES/DRESSINGS) IMPLANT
BINDER BREAST XXLRG (GAUZE/BANDAGES/DRESSINGS) ×3 IMPLANT
BLADE HEX COATED 2.75 (ELECTRODE) ×3 IMPLANT
BLADE SURG 10 STRL SS (BLADE) IMPLANT
BLADE SURG 15 STRL LF DISP TIS (BLADE) ×4 IMPLANT
BLADE SURG 15 STRL SS (BLADE) ×6
CANISTER SUCTION 1200CC (MISCELLANEOUS) ×3 IMPLANT
CHLORAPREP W/TINT 26ML (MISCELLANEOUS) ×5 IMPLANT
CLIP APPLIE 11 MED OPEN (CLIP) ×2 IMPLANT
CLOTH BEACON ORANGE TIMEOUT ST (SAFETY) ×3 IMPLANT
COVER MAYO STAND STRL (DRAPES) ×6 IMPLANT
COVER PROBE 5X48 (MISCELLANEOUS) ×3
COVER PROBE W GEL 5X96 (DRAPES) ×3 IMPLANT
COVER TABLE BACK 60X90 (DRAPES) ×3 IMPLANT
DECANTER SPIKE VIAL GLASS SM (MISCELLANEOUS) ×3 IMPLANT
DERMABOND ADVANCED (GAUZE/BANDAGES/DRESSINGS) ×3
DERMABOND ADVANCED .7 DNX12 (GAUZE/BANDAGES/DRESSINGS) ×6 IMPLANT
DEVICE DUBIN W/COMP PLATE 8390 (MISCELLANEOUS) ×6 IMPLANT
DRAIN CHANNEL 19F RND (DRAIN) ×1 IMPLANT
DRAPE C-ARM 42X72 X-RAY (DRAPES) ×3 IMPLANT
DRAPE LAPAROSCOPIC ABDOMINAL (DRAPES) ×4 IMPLANT
DRAPE UTILITY XL STRL (DRAPES) ×5 IMPLANT
DRSG PAD ABDOMINAL 8X10 ST (GAUZE/BANDAGES/DRESSINGS) ×2 IMPLANT
DRSG TEGADERM 2-3/8X2-3/4 SM (GAUZE/BANDAGES/DRESSINGS) IMPLANT
DRSG TEGADERM 4X10 (GAUZE/BANDAGES/DRESSINGS) IMPLANT
DRSG TEGADERM 4X4.75 (GAUZE/BANDAGES/DRESSINGS) IMPLANT
ELECT REM PT RETURN 9FT ADLT (ELECTROSURGICAL) ×3
ELECTRODE REM PT RTRN 9FT ADLT (ELECTROSURGICAL) ×2 IMPLANT
EVACUATOR SILICONE 100CC (DRAIN) ×2 IMPLANT
GAUZE SPONGE 4X4 12PLY STRL LF (GAUZE/BANDAGES/DRESSINGS) ×3 IMPLANT
GAUZE SPONGE 4X4 16PLY XRAY LF (GAUZE/BANDAGES/DRESSINGS) IMPLANT
GLOVE EUDERMIC 7 POWDERFREE (GLOVE) ×4 IMPLANT
GOWN PREVENTION PLUS XLARGE (GOWN DISPOSABLE) ×6 IMPLANT
GOWN PREVENTION PLUS XXLARGE (GOWN DISPOSABLE) ×5 IMPLANT
IV CATH PLACEMENT UNIT 16 GA (IV SOLUTION) IMPLANT
IV HEPARIN 1000UNITS/500ML (IV SOLUTION) ×3 IMPLANT
IV KIT MINILOC 20X1 SAFETY (NEEDLE) ×3 IMPLANT
KIT CVR 48X5XPRB PLUP LF (MISCELLANEOUS) ×2 IMPLANT
KIT MARKER MARGIN INK (KITS) ×5 IMPLANT
KIT PORT POWER 8FR ISP CVUE (Catheter) ×2 IMPLANT
NDL BLUNT 17GA (NEEDLE) IMPLANT
NDL HYPO 25X1 1.5 SAFETY (NEEDLE) ×2 IMPLANT
NDL SAFETY ECLIPSE 18X1.5 (NEEDLE) ×2 IMPLANT
NEEDLE BLUNT 17GA (NEEDLE) IMPLANT
NEEDLE HYPO 18GX1.5 SHARP (NEEDLE) ×3
NEEDLE HYPO 22GX1.5 SAFETY (NEEDLE) ×6 IMPLANT
NEEDLE HYPO 25X1 1.5 SAFETY (NEEDLE) ×6 IMPLANT
NS IRRIG 1000ML POUR BTL (IV SOLUTION) ×4 IMPLANT
PACK BASIN DAY SURGERY FS (CUSTOM PROCEDURE TRAY) ×3 IMPLANT
PAD ALCOHOL SWAB (MISCELLANEOUS) ×3 IMPLANT
PENCIL BUTTON HOLSTER BLD 10FT (ELECTRODE) ×7 IMPLANT
SLEEVE SCD COMPRESS KNEE MED (MISCELLANEOUS) ×1 IMPLANT
SPONGE GAUZE 4X4 12PLY (GAUZE/BANDAGES/DRESSINGS) ×3 IMPLANT
SPONGE LAP 18X18 X RAY DECT (DISPOSABLE) ×2 IMPLANT
SPONGE LAP 4X18 X RAY DECT (DISPOSABLE) ×5 IMPLANT
SUT ETHILON 3 0 FSL (SUTURE) ×2 IMPLANT
SUT MNCRL AB 4-0 PS2 18 (SUTURE) ×7 IMPLANT
SUT PROLENE 2 0 CT2 30 (SUTURE) ×3 IMPLANT
SUT SILK 2 0 FS (SUTURE) ×3 IMPLANT
SUT SILK 2 0 SH (SUTURE) ×3 IMPLANT
SUT VIC AB 3-0 SH 27 (SUTURE) ×6
SUT VIC AB 3-0 SH 27X BRD (SUTURE) IMPLANT
SUT VICRYL 3-0 CR8 SH (SUTURE) ×3 IMPLANT
SYR 5ML LUER SLIP (SYRINGE) ×3 IMPLANT
SYR CONTROL 10ML LL (SYRINGE) ×6 IMPLANT
TOWEL OR 17X24 6PK STRL BLUE (TOWEL DISPOSABLE) ×6 IMPLANT
TOWEL OR NON WOVEN STRL DISP B (DISPOSABLE) ×4 IMPLANT
TUBE CONNECTING 20X1/4 (TUBING) ×3 IMPLANT
YANKAUER SUCT BULB TIP NO VENT (SUCTIONS) ×5 IMPLANT

## 2012-11-23 NOTE — Interval H&P Note (Signed)
History and Physical Interval Note:  11/23/2012 9:28 AM  Amy Leonard  has presented today for surgery, with the diagnosis of bilateral breast cancer  The goals and the  various methods of treatment have been discussed with the patient and family. After consideration of risks, benefits and other options for treatment, the patient has consented to  Procedure(s): PARTIAL MASTECTOMY WITH NEEDLE LOCALIZATION AND AXILLARY SENTINEL LYMPH NODE BX (Bilateral) INSERTION PORT-A-CATH (N/A) as a surgical intervention .  The patient's history has been reviewed, patient examined today , no change in status, stable for surgery.  I have reviewed the patient's chart and labs.  Questions were answered to the patient's satisfaction.     Ernestene Mention

## 2012-11-23 NOTE — Anesthesia Procedure Notes (Signed)
Procedure Name: LMA Insertion Date/Time: 11/23/2012 10:22 AM Performed by: Zenia Resides D Pre-anesthesia Checklist: Patient identified, Emergency Drugs available, Suction available and Patient being monitored Patient Re-evaluated:Patient Re-evaluated prior to inductionOxygen Delivery Method: Circle System Utilized Preoxygenation: Pre-oxygenation with 100% oxygen Intubation Type: IV induction Ventilation: Mask ventilation without difficulty LMA: LMA inserted LMA Size: 4.0 Number of attempts: 1 Airway Equipment and Method: bite block Placement Confirmation: positive ETCO2 and breath sounds checked- equal and bilateral Tube secured with: Tape Dental Injury: Teeth and Oropharynx as per pre-operative assessment

## 2012-11-23 NOTE — Anesthesia Postprocedure Evaluation (Signed)
  Anesthesia Post-op Note  Patient: Amy Leonard  Procedure(s) Performed: Procedure(s): PARTIAL MASTECTOMY WITH NEEDLE LOCALIZATION AND AXILLARY SENTINEL LYMPH NODE BX (Bilateral) INSERTION PORT-A-CATH (Right)  Patient Location: PACU  Anesthesia Type:General  Level of Consciousness: awake, alert , oriented and patient cooperative  Airway and Oxygen Therapy: Patient Spontanous Breathing and Patient connected to nasal cannula oxygen  Post-op Pain: mild  Post-op Assessment: Post-op Vital signs reviewed, Patient's Cardiovascular Status Stable, Respiratory Function Stable, Patent Airway, No signs of Nausea or vomiting and Pain level controlled  Post-op Vital Signs: Reviewed and stable  Complications: No apparent anesthesia complications

## 2012-11-23 NOTE — Transfer of Care (Signed)
Immediate Anesthesia Transfer of Care Note  Patient: Amy Leonard  Procedure(s) Performed: Procedure(s): PARTIAL MASTECTOMY WITH NEEDLE LOCALIZATION AND AXILLARY SENTINEL LYMPH NODE BX (Bilateral) INSERTION PORT-A-CATH (Right)  Patient Location: PACU  Anesthesia Type:General  Level of Consciousness: awake  Airway & Oxygen Therapy: Patient Spontanous Breathing and Patient connected to face mask oxygen  Post-op Assessment: Report given to PACU RN and Post -op Vital signs reviewed and stable  Post vital signs: Reviewed and stable  Complications: No apparent anesthesia complications

## 2012-11-23 NOTE — Progress Notes (Signed)
I called and left a message for the patient. She has son listed on her EPP and he is age 52. I left a message for her to call me back to advise if he is in school???

## 2012-11-23 NOTE — Anesthesia Preprocedure Evaluation (Signed)
Anesthesia Evaluation  Patient identified by MRN, date of birth, ID band Patient awake    Reviewed: Allergy & Precautions, H&P , NPO status , Patient's Chart, lab work & pertinent test results  History of Anesthesia Complications Negative for: history of anesthetic complications  Airway Mallampati: II TM Distance: >3 FB Neck ROM: Full    Dental  (+) Edentulous Upper, Partial Lower and Dental Advisory Given   Pulmonary Current Smoker,  breath sounds clear to auscultation  Pulmonary exam normal       Cardiovascular hypertension, Pt. on medications Rhythm:Regular Rate:Normal     Neuro/Psych  Headaches, PSYCHIATRIC DISORDERS Anxiety Depression CVA, No Residual Symptoms    GI/Hepatic Neg liver ROS, GERD-  Medicated and Controlled,  Endo/Other  Morbid obesity  Renal/GU negative Renal ROS     Musculoskeletal   Abdominal (+) + obese,   Peds  Hematology   Anesthesia Other Findings Bilateral breast cancer  Reproductive/Obstetrics                           Anesthesia Physical Anesthesia Plan  ASA: III  Anesthesia Plan: General   Post-op Pain Management:    Induction: Intravenous  Airway Management Planned: LMA  Additional Equipment:   Intra-op Plan:   Post-operative Plan:   Informed Consent: I have reviewed the patients History and Physical, chart, labs and discussed the procedure including the risks, benefits and alternatives for the proposed anesthesia with the patient or authorized representative who has indicated his/her understanding and acceptance.   Dental advisory given  Plan Discussed with: CRNA and Surgeon  Anesthesia Plan Comments: (Plan routine monitors, GA- LMA OK)        Anesthesia Quick Evaluation

## 2012-11-23 NOTE — Op Note (Signed)
Patient Name:           Amy Leonard   Date of Surgery:        11/23/2012  Pre op Diagnosis:     1) Triple negative, invasive ductal carcinoma right breast, upper outer quadrant, clinical stage T2, N0.                                      2) Invasive carcinoma left breast, central retroareolar, 8 mm diameter, clinical stage T1b., N0, receptor positive, HER-2-negative  Post op Diagnosis:    Same  Procedure:                 Injected blue dye right breast Inject blue dye  left breast Right partial mastectomy with bracketed needle localization Right axillary lymph node dissection Left partial mastectomy with needle localization Left axillary sentinel node biopsy Insertion of 8 French power port ClearView tunneled venous vascular access device Use of fluoroscopic for guidance and positioning.  Surgeon:                     Angelia Mould. Derrell Lolling, M.D., FACS  Assistant:                      None  Operative Indications:   Amy Leonard is a 52 y.o. female. She was referred by Dr. Britta Mccreedy at the breast center of [facility] for evaluation and management of bilateral breast cancer. She was  seen at the [facility] recently by Dr. Welton Flakes, Dr. Roselind Messier, and me. Dr. Vinnie Langton is her primary care physician.  This patient has no prior history of breast problems. Last mammogram 2 years ago. Recent screening mammograms and ultrasound showed bilateral abnormalities. In the right breast there are 2 adjacent irregular masses up at the 10:00 position. 1.4 cm and 6 mm. There was a single thickened lymph node. On the left side there was an 8 mm mass at the 7:30 position, centrally.   Biopsy of the largest mass on the right shows invasive ductal carcinoma, triple negative tumor, Ki-67 92%. Biopsy of the nodule in the left breast reveals Invasive ductal carcinoma, Her-2 negative, ER 100%, PR 25%, Ki-67 9%.  On reevaluation of the right axillary lymph node it did not look abnormal and was not biopsied.  Subsequent MRI  shows what looks like a 1.7 cm mass in the upper-outer quadrant of the right breast with 2 or 3 satellite nodules immediately adjacent to this. I discussed this with Anselmo Pickler and with Britta Mccreedy at the [facility] and they stated the anterior-posterior extent of this is about 3.8 cm and can probably be bracketed with 2 wires. A single wire should suffice on the left. The MRI showed an 8 mm mass in the left breast centrally, a solitary finding. There was no adenopathy seen.  Family history reveals that she has 4 sisters older than her, no breast cancer. Family history also negative for ovarian cancer.  The patient's comorbidities include a current tobacco abuse, Barrett's esophagus, mini stroke seen on CT brain without deficit, depression, hypertension, hyperlipidemia, obesity.     Operative Findings:       On the right side there were 2 wires placed from a lateral to medial direction. Specimen looked good with the marker clip and the density in the center of the specimen. On the right side the blue dye  and the radioactive injection did not map and so we had to do a right axillary lymph node dissection. On the left side the wire was placed from a superior approach and directed inferiorly and very deep in the breast. I thought we might have come close to the posterior margin and so I reexcised the posterior margin and sent that as a second specimen. In the left axilla I found a single hot,  very blue sentinel node. The Port-A-Cath was inserted through the right subclavian vein and appeared to be positioned well and had excellent blood return and flushed easily at the completion of the case.  Procedure in Detail:          The patient underwent bracketed wire localization of the right and single wire localization of the left. The wires appeared to be well placed as described above. She underwent bilateral injection of radionuclide in the holding area by the  nuclear medicine technician. She was taken to the  operating room where general anesthesia was induced. Intravenous antibiotics were given. Surgical time out was performed. Following alcohol prep I injected 5 cc of blue dye into both the right breast subareolar area and the left breast subareolar area. This was methylene blue mixed with saline in our usual ratio. Both breasts were massaged. We then prepped and draped the neck, both breasts, shoulders, axilla and chest wall. 0.5% Marcaine with epinephrine was used as a local infiltration anesthetic.  I first performed the partial mastectomy on the right side using a radially oriented incision at the 9:00 position. Dissection was carried down deep in the breast tissue around the localizing wires. The specimen was removed and marked with silk sutures and multicolored ink  to orient the pathologist. The specimen looked very good and was marked and sent to pathology. Hemostasis was excellent and achieved electrocautery. The wound was irrigated with saline. The breast tissues were closed in multiple layers with 3-0 Vicryl sutures and the skin closed with a running subcuticular suture of 4-0 Monocryl. We used the neoprobe did not hear any radioactivity in the right axilla. I made a transverse incision in the right axilla at the hairline. Dissection was carried down through the subcutaneous tissue and through the clavipectoral fascia and I entered the axilla. I searched  carefully for radioactivity and could not find any. I searched very carefully for any blue lymphatics or blue lymph nodes and did not find any. I then chose to perform a right axillary lymph node dissection took the dissection up to the axillary vein and dissected all of level I and  level II lymph nodes down from below the axillary vein. A few venous tributaries  and one sensory nerve were controlled with metal clips and divided. I felt that we preserved the thoracodorsal neurovascular bundle and the long thoracic nerve. There were several enlarged lymph  nodes which appeared hyperplastic. All of the  nodes were sent as separate specimen. I irrigated the right axilla. Hemostasis was excellent. A 19 [place] Blake drain was placed in the right axilla and brought out through a separate stab incision in the right lateral chest wall, sutured to the skin with a nylon suture and connected to suction bulb. The clavipectoral fascia and subcutaneous tissue was closed with 3-0 Vicryl sutures and the skin closed with a running subcuticular suture of 4-0 Monocryl and Dermabond.  I then turned my attention to the left side. The localizing wire was in the very superior location. I made a transverse curvilinear  incision midway between the wire and the areolar margin. Dissection was carried down deep into the breast tissue following the wire. I removed the specimen I felt that I was somewhat close to the posterior margin. Specimen mammogram showed that I had  removed the wire and the marker clip but it did seem to be at the edge of the specimen. This was sent to the lab. I then reexcised about a 1 cm thick by 4 cm x 4 cm posterior margin. I marked the new posterior margin with the kit and sent that as a separate specimen, labeling at as reexcision posterior margin. The left lumpectomy cavity was marked with metal clips. It was irrigated with saline. Hemostasis was excellent. The breast tissues were reapproximated with interrupted 3-0 Vicryl sutures and the skin closed with a running subcuticular suture of 4-0 Monocryl and Dermabond. In the left axilla I identified radioactivity with the neoprobe. Transverse incision was made the hairline. Dissection was carried down through the clavipectoral fascia and into the axilla. I found one very hot, very blue lymph node and sent that to the lab. After the single node was removed really was no other radioactivity and  I found no other nodes. I irrigated this wound, insured that it was hemostatic and closed it with 3-0 Vicryl sutures and then  subcuticular 4-0 Monocryl and Dermabond for the skin.  We then repositioned the patient with her arms at her sides a small roll behind her shoulders. We prepped and draped from the neck and chest bilaterally. Another surgical time out was held. A right subclavian venipuncture was performed and a guidewire inserted into the superior vena cava using fluoroscopic guidance. Using the C-arm I marked a template of the chest wall to mark where catheter should be with the tip in the superior vena cava near the right atrium. I then made a transverse incision about 2-1/2 cm below the medial third of the clavicle on the right. Some of the subcutaneous tissue was debrided. I created a pocket at the level of the pectoralis fascia. Using a tunneling device I drew  the Port-A-Cath catheter from the wire insertion site to the port pocket site. Then using the template of the chest wall I decided to cut the catheter 24 cm. The catheter was secured to the port with a locking device and the port and catheter flushed with heparinized saline. The port was sutured to the pectoralis fascia with 3 interrupted sutures of 2-0 Prolene. I inserted the dilator and peel-away sheath over the guidewire into the central venous circulation. I removed the guidewire and the dilator and then threaded the catheter through the peel-away sheath and removed the peel-away sheath. At this point I had excellent blood return from the port and flushed easily. It was flushed with concentrated heparin. Fluoroscopy was used and it looked like the catheter was well positioned with the tip in the superior vena cava and no deformity of the catheter anywhere along its course. The port pocket site was closed with interrupted sutures of 3-0 Vicryl in subcutaneous tissue and the skin incisions were closed with subcuticular sutures of 4-0 Monocryl and Dermabond. The patient tolerated the procedure well was taken to the recovery room in stable condition. EBL was probably  50-75 cc. Counts correct. Complications none. Portable chest x-ray is planned.     Angelia Mould. Derrell Lolling, M.D., FACS General and Minimally Invasive Surgery Breast and Colorectal Surgery  11/23/2012 1:47 PM

## 2012-11-24 ENCOUNTER — Encounter (HOSPITAL_BASED_OUTPATIENT_CLINIC_OR_DEPARTMENT_OTHER): Payer: Self-pay | Admitting: General Surgery

## 2012-11-24 MED ORDER — OXYCODONE-ACETAMINOPHEN 7.5-325 MG PO TABS: 1.0000 | ORAL_TABLET | ORAL | Status: DC | PRN

## 2012-11-24 NOTE — Progress Notes (Addendum)
1 Day Post-Op  Subjective: Doing well. Pain well-controlled with Percocet.  Tolerating Percocet without any adverse side effects.Tolerating diet. Ambulating to bathroom. Voiding without difficulty.  JP drainage right axilla thin, serosanguineous. She has been taught drain care and record keeping.  Port-A-Cath positioning looks acceptable on chest x-ray. No pneumothorax.  Objective: Vital signs in last 24 hours: Temp:  [97.5 F (36.4 C)-97.9 F (36.6 C)] 97.6 F (36.4 C) (03/31 2300) Pulse Rate:  [67-113] 82 (04/01 0300) Resp:  [11-26] 16 (04/01 0300) BP: (94-149)/(61-111) 94/65 mmHg (03/31 2300) SpO2:  [91 %-100 %] 97 % (04/01 0300) Weight:  [187 lb 12.8 oz (85.186 kg)] 187 lb 12.8 oz (85.186 kg) (03/31 0905)    Intake/Output from previous day: 03/31 0701 - 04/01 0700 In: 3460 [P.O.:1710; I.V.:1750] Out: 2488 [Urine:2425; Drains:63] Intake/Output this shift: Total I/O In: 1140 [P.O.:1140] Out: 2428 [Urine:2400; Drains:28]  General appearance: alert. Mental status normal. In no distress. Appears quite comfortable. Breasts: , bilateral lumpectomy scars and bilateral axillary incisions and Port-A-Cath scar right infraclavicular area, all looked good. No hematoma. No skin necrosis. Right axillary drain functioning.  Lab Results:  No results found for this or any previous visit (from the past 24 hour(s)).   Studies/Results: @RISRSLT24 @  . atorvastatin  10 mg Oral Daily  . heparin  5,000 Units Subcutaneous Q8H  . losartan  50 mg Oral Daily  . pantoprazole  40 mg Oral Daily     Assessment/Plan: s/p Procedure(s): PARTIAL MASTECTOMY WITH NEEDLE LOCALIZATION AND AXILLARY SENTINEL LYMPH NODE BX INSERTION PORT-A-CATH  POD #1. Stable. No evidence of bleeding. Pain control excellent. Discharge home with drain today. Diet and activities discussed Prescription for Percocet given Return to office in one week for drain check and hopefully drain removal We'll call pathology  report to the patient in 24-48 hours, when available  @PROBHOSP @  LOS: 1 day    Yareth Kearse M. Derrell Lolling, M.D., [facility] General and Minimally invasive Surgery Breast and Colorectal Surgery Office:   [phone] Pager:   [phone]  11/24/2012  . .prob

## 2012-11-24 NOTE — Addendum Note (Signed)
Addendum created 11/24/12 9604 by Ronnette Hila, CRNA   Modules edited: Anesthesia Medication Administration

## 2012-11-25 ENCOUNTER — Telehealth (INDEPENDENT_AMBULATORY_CARE_PROVIDER_SITE_OTHER): Payer: Self-pay

## 2012-11-25 ENCOUNTER — Ambulatory Visit (HOSPITAL_BASED_OUTPATIENT_CLINIC_OR_DEPARTMENT_OTHER)
Admission: RE | Admit: 2012-11-25 | Discharge: 2012-11-25 | Disposition: A | Discharge status: Home / Self Care | Payer: BC Managed Care – PPO | Source: Ambulatory Visit | Attending: Internal Medicine | Admitting: Internal Medicine

## 2012-11-25 ENCOUNTER — Ambulatory Visit (HOSPITAL_COMMUNITY)
Admission: RE | Admit: 2012-11-25 | Discharge: 2012-11-25 | Disposition: A | Discharge status: Home / Self Care | Payer: BC Managed Care – PPO | Source: Ambulatory Visit | Attending: Internal Medicine | Admitting: Internal Medicine

## 2012-11-25 VITALS — BP 108/62 | HR 71 | Wt 197.5 lb

## 2012-11-25 DIAGNOSIS — C50119 Malignant neoplasm of central portion of unspecified female breast: Secondary | ICD-10-CM

## 2012-11-25 DIAGNOSIS — C50919 Malignant neoplasm of unspecified site of unspecified female breast: Secondary | ICD-10-CM | POA: Insufficient documentation

## 2012-11-25 DIAGNOSIS — Z5111 Encounter for antineoplastic chemotherapy: Secondary | ICD-10-CM

## 2012-11-25 DIAGNOSIS — C50112 Malignant neoplasm of central portion of left female breast: Secondary | ICD-10-CM

## 2012-11-25 DIAGNOSIS — I079 Rheumatic tricuspid valve disease, unspecified: Secondary | ICD-10-CM | POA: Insufficient documentation

## 2012-11-25 DIAGNOSIS — I059 Rheumatic mitral valve disease, unspecified: Secondary | ICD-10-CM | POA: Insufficient documentation

## 2012-11-25 DIAGNOSIS — F17200 Nicotine dependence, unspecified, uncomplicated: Secondary | ICD-10-CM | POA: Insufficient documentation

## 2012-11-25 DIAGNOSIS — C50411 Malignant neoplasm of upper-outer quadrant of right female breast: Secondary | ICD-10-CM

## 2012-11-25 DIAGNOSIS — I10 Essential (primary) hypertension: Secondary | ICD-10-CM | POA: Insufficient documentation

## 2012-11-25 DIAGNOSIS — C50419 Malignant neoplasm of upper-outer quadrant of unspecified female breast: Secondary | ICD-10-CM

## 2012-11-25 NOTE — Progress Notes (Signed)
Patient ID: Amy Leonard, female   DOB: [birth year], 52 y.o.   MRN: [id number]  General Surgeon: Dr Derrell Lolling Oncologist: Dr Welton Flakes PCP: Dr Lucretia Roers  HPI: Ms Amy Leonard is a 52 year old with a history of bilateral invasive ductal carcinoma breast cancer( Left ER positive PR positive HER-2/neu negative, R Triple-negative breast cancer), depression, HTN, hyperlipidemia,  and current tobacco.She is currently trying quit smoking. Occasional beer.  S/P porta cath.   She will receive adjuvant chemotherapy with Adriamycin Cytoxan given dose dense 4 cycles followed by weekly Taxol and carboplatinum for a total of 12 weeks. She will also need need radiation therapy adjuvantly.   ECHO 4/2/13EF 55-60% lateral s' 11.5 poor windows which will be difficult to follow subsequent lateral S'.   She presents today as a new patient at the request of Dr Welton Flakes for breast cancer follow up. She works full time at [employer]. Plan to start chemotherapy in April.    Review of Systems:     Cardiac Review of Systems: {Y] = yes [ ]  = no  Chest Pain [    ]  Resting SOB [   ] Exertional SOB  [  ]  Orthopnea [  ]   Pedal Edema [   ]    Palpitations [  ] Syncope  [  ]   Presyncope [   ]  General Review of Systems: [Y] = yes [  ]=no Constitional: recent weight change [  ]; anorexia [  ]; fatigue [ Y ]; nausea [  ]; night sweats [  ]; fever [  ]; or chills [  ];                                                                                                                                         Dental: poor dentition[  ]; Last Dentist visit:   Eye : blurred vision [  ]; diplopia [   ]; vision changes [  ];  Amaurosis fugax[  ]; Resp: cough [  ];  wheezing[  ];  hemoptysis[  ]; shortness of breath[  ]; paroxysmal nocturnal dyspnea[  ]; dyspnea on exertion[  ]; or orthopnea[  ];  GI:  gallstones[  ], vomiting[  ];  dysphagia[  ]; melena[  ];  hematochezia [  ]; heartburn[  ];   Hx of  Colonoscopy[  ]; GU: kidney stones [  ];  hematuria[  ];   dysuria [  ];  nocturia[  ];  history of     obstruction [  ];                 Skin: rash, swelling[  ];, hair loss[  ];  peripheral edema[  ];  or itching[  ]; Musculosketetal: myalgias[  ];  joint swelling[  ];  joint erythema[  ];  joint pain[  ];  back  pain[  ];  Heme/Lymph: bruising[  ];  bleeding[  ];  anemia[  ];  Neuro: TIA[  ];  headaches[  ];  stroke[  ];  vertigo[  ];  seizures[  ];   paresthesias[  ];  difficulty walking[  ];  Psych:depression[Y  ]; anxiety[Y  ];  Endocrine: diabetes[  ];  thyroid dysfunction[  ];  Immunizations: Flu [  ]; Pneumococcal[  ];  Other:    Past Medical History  Diagnosis Date  . Elevated WBC count     nl diff  . Chronic back pain     spinal stenosis  . Tobacco abuse   . Anxiety and depression   . HSV infection     recurrent (side and buttox)  . Migraine   . Glaucoma(365)     ?  Marland Kitchen Carpal tunnel syndrome   . HLD (hyperlipidemia)   . Folliculitis 05/05/2011  . Anxiety   . Depression   . 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 GERD (gastroesophageal reflux disease)   . Hypertension   . Breast cancer   . Wears dentures     top    Current Outpatient Prescriptions  Medication Sig Dispense Refill  . Acetaminophen (TYLENOL ARTHRITIS PAIN PO) Take 2 tablets by mouth as needed.      Marland Kitchen acyclovir (ZOVIRAX) 5 % ointment Apply topically as needed.       Marland Kitchen aspirin 81 MG chewable tablet Chew 81 mg by mouth daily.      Marland Kitchen atorvastatin (LIPITOR) 10 MG tablet Take 1 tablet (10 mg total) by mouth daily.  90 tablet  1  . calcium carbonate (TUMS - DOSED IN MG ELEMENTAL CALCIUM) 500 MG chewable tablet Chew by mouth as directed.        . cyclobenzaprine (FLEXERIL) 10 MG tablet Take 10 mg by mouth as needed.       Marland Kitchen losartan (COZAAR) 50 MG tablet Take 1 tablet (50 mg total) by mouth daily.  30 tablet  11  . omeprazole (PRILOSEC) 40 MG capsule Take 1 capsule (40 mg total) by mouth 2 (two) times daily.  60 capsule  11  . oxyCODONE-acetaminophen  (PERCOCET) 7.5-325 MG per tablet Take 1 tablet by mouth every 4 (four) hours as needed for pain.  30 tablet  0  . traMADol (ULTRAM) 50 MG tablet Take 1 tablet (50 mg total) by mouth every 8 (eight) hours as needed.  30 tablet  0   No current facility-administered medications for this encounter.     Allergies  Allergen Reactions  . Bupropion Hcl Nausea Only and Other (See Comments)     GI, sleepy  . Chlorhexidine Gluconate Itching and Rash  . Hydrocod Polst-Cpm Polst Er Nausea And Vomiting    vomiting  . Lisinopril Other (See Comments)    Leg cramps    . Naproxen Sodium Other (See Comments)    does not tolerate    History   Social History  . Marital Status: Divorced    Spouse Name: N/A    Number of Children: N/A  . Years of Education: N/A   Occupational History  . Full time and PT job    Social History Main Topics  . Smoking status: Current Every Day Smoker -- 0.50 packs/day    Types: Cigarettes  . Smokeless tobacco: Never Used     Comment: 1 1/2 ppd -form given 10-07-12  . Alcohol Use: Yes     Comment: 4-5 beers fri and sat night-does this  rarely  . Drug Use: No  . Sexually Active: Not Currently   Other Topics Concern  . Not on file   Social History Narrative   Separated; full time and part time job; no regular exercise.              Family History  Problem Relation Age of Onset  . Stroke Maternal Grandmother   . Diabetes Mother   . Hypertension Mother   . Leukemia Mother   . Obesity Mother   . Depression Sister   . Bipolar disorder Son     Bipolar / oppositional defiant  . Alcohol abuse Sister   . Drug abuse Sister   . Drug abuse Brother   . Alcohol abuse Brother   . Alcohol abuse Sister   . Drug abuse Sister   . Colon cancer Neg Hx   . Diabetes Maternal Grandmother     PHYSICAL EXAM: Filed Vitals:   11/25/12 1024  BP: 108/62  Pulse: 71   General:  Well appearing. No respiratory difficulty HEENT: normal Neck: supple. no JVD. Carotids 2+  bilat; no bruits. No lymphadenopathy or thryomegaly appreciated. Cor: PMI nondisplaced. Regular rate & rhythm. No rubs, gallops or murmurs. Lungs: clear Abdomen: soft, nontender, nondistended. No hepatosplenomegaly. No bruits or masses. Good bowel sounds. Extremities: no cyanosis, clubbing, rash, edema Neuro: alert & oriented x 3, cranial nerves grossly intact. moves all 4 extremities w/o difficulty. Affect pleasant.  No results found for this or any previous visit (from the past 24 hour(s)). Dg Chest Portable 1 View  11/23/2012  *RADIOLOGY REPORT*  Clinical Data: 52 year old female status post Port-A-Cath placement.  Breast cancer.  PORTABLE CHEST - 1 VIEW  Comparison: 11/19/2012.  Findings: Portable semi upright AP view 1441 hours.  Right chest subclavian approach Port-A-Cath placed.  Acute angulation of the catheter tubing at the level of the right axilla.  Tip at the level of the cavoatrial junction.  Interval postoperative changes to the right chest wall with subcutaneous appearing drain in place.  No pneumothorax.  No pleural effusion or pulmonary edema.  Mildly increased bibasilar opacity compatible with atelectasis.  Cardiac size and mediastinal contours are within normal limits.  Visualized tracheal air column is within normal limits.  IMPRESSION: 1.  Right subclavian approach chest Port-A-Cath in place.  Tip at the level of the cavoatrial junction.  Mildly angulated catheter tubing at the level of the axilla. 2.  Interval postoperative changes to the right chest wall. No pneumothorax. 3.  Mild dependent atelectasis.   Original Report Authenticated By: Erskine Speed, M.D.    Dg Fluoro Guide Cv Line-no Report  11/23/2012  CLINICAL DATA: bil breast cancer   FLOURO GUIDE CV LINE  Fluoroscopy was utilized by the requesting physician.  No radiographic  interpretation.

## 2012-11-25 NOTE — Assessment & Plan Note (Addendum)
She presents today as a new patient at the request of Dr Welton Flakes due to breast cancer treatment. Dr Gala Romney discussed at reviewed ECHO results. Explained risk of cardiotoxicity related to adriamycin.  Follow up in 5 weeks with an ECHO.   Patient seen and examined with Tonye Becket, NP. We discussed all aspects of the encounter. I agree with the assessment and plan as stated above. I reviewed echos personally. EF and Doppler parameters are OK. Explained risk of cardiotoxicity in setting of adriamycin. Will follow closely. She is already on ARB. BP probably too low to start b-blocker.

## 2012-11-25 NOTE — Patient Instructions (Addendum)
Follow up in  4-5 weeks with an  ECHO.

## 2012-11-25 NOTE — Telephone Encounter (Signed)
Patient calling into office regarding numbness in her right arm.  Patient denies any swelling at this time.  Patient continues to record drain output.  Patient will call our office Friday if the numbness continues or earlier if she develops any swelling.  Patient given her post op appointment for 12/01/12 @ 8:30 w/Dr. Derrell Lolling.

## 2012-11-26 ENCOUNTER — Telehealth (INDEPENDENT_AMBULATORY_CARE_PROVIDER_SITE_OTHER): Payer: Self-pay | Admitting: General Surgery

## 2012-11-26 NOTE — Telephone Encounter (Signed)
I discussed the patient's pathology report with her in detail. On the right side she has invasive ductal carcinoma which is 9 mm from the nearest inferior margin. In situ cancer is 1 mm from the lateral margin. 11 lymph nodes are negative for tumor. On the left-side she has invasive ductal carcinoma. There was cancer grossly present at the posterior margin. The posterior margin was reexcised and that is negative for atypia or malignancy. The single sentinel lymph node was positive for metastatic mammary carcinoma. The patient seems to understand the details of her pathology report. She has appointments with medical oncology and radiation oncology. I will see her next week to check her wound and drains.   Amy Leonard. Derrell Lolling, M.D., [facility] General and Minimally invasive Surgery Breast and Colorectal Surgery Office:   [phone] Pager:   [phone]

## 2012-11-26 NOTE — Telephone Encounter (Signed)
Pt called to report the pain medicine makes her very sleepy.  She is taking it Q4H, regularly.  She denies excessive pain, but thought she needed to take it as you would an antibiotic.  Explained she can wait to take it every 6 hours or 8 hours.  Or not take it at all unless excessive pain that Tylenol or Tramadol will not help.  She states she understands and will try this.  Also, she wanted to let us know the numbness on the back of her Rt arm is still present.

## 2012-11-27 ENCOUNTER — Telehealth: Payer: Self-pay | Admitting: *Deleted

## 2012-11-27 NOTE — Telephone Encounter (Signed)
Confirmed f/u appt with Dr. Welton Flakes.  Pt relate she is still "a little sore" from surgery.  She is "cutting back" on her pain medication.

## 2012-11-27 NOTE — Telephone Encounter (Signed)
Informed pt that she will be discussing chemotherapy regimen and anti-nausea medication.  Received verbal understanding.  Pt denies further needs at this time.  Encourage pt to call with questions or concerns.  Contact information given.

## 2012-11-30 ENCOUNTER — Encounter: Payer: Self-pay | Admitting: Oncology

## 2012-11-30 NOTE — Progress Notes (Signed)
Had already spoke with the patient and she confirmed her son just lives with her and she can't claim him- not in school. I called her back and left a message. I can't use her paystubs from Feb. I need the last 3 stubs. She will be in 4/10 and I need for her to bring with her.

## 2012-12-01 ENCOUNTER — Ambulatory Visit (INDEPENDENT_AMBULATORY_CARE_PROVIDER_SITE_OTHER): Payer: BC Managed Care – PPO | Admitting: General Surgery

## 2012-12-01 ENCOUNTER — Encounter (INDEPENDENT_AMBULATORY_CARE_PROVIDER_SITE_OTHER): Payer: Self-pay | Admitting: General Surgery

## 2012-12-01 VITALS — BP 116/74 | HR 76 | Temp 98.7°F | Resp 16 | Ht 63.0 in | Wt 194.0 lb

## 2012-12-01 DIAGNOSIS — C50112 Malignant neoplasm of central portion of left female breast: Secondary | ICD-10-CM

## 2012-12-01 DIAGNOSIS — C50419 Malignant neoplasm of upper-outer quadrant of unspecified female breast: Secondary | ICD-10-CM

## 2012-12-01 DIAGNOSIS — C50119 Malignant neoplasm of central portion of unspecified female breast: Secondary | ICD-10-CM

## 2012-12-01 DIAGNOSIS — C50411 Malignant neoplasm of upper-outer quadrant of right female breast: Secondary | ICD-10-CM

## 2012-12-01 NOTE — Patient Instructions (Signed)
We have discussed your pathology report. Your bilateral breast cancer appears to be completed  resected, and I do not think that you need any further surgery. The lymph nodes on the right side were negative and the one lymph node on the left side showed microscopic cancer.  All  wounds appear to be healing well.  You are still draining too much to remove the drain. Continue to keep a written record of the drainage. Dr. Derrell Lolling will see you next week to see if the drain can be removed.  Keep your appointments with Dr. Welton Flakes and with the chemotherapy class.

## 2012-12-01 NOTE — Progress Notes (Signed)
Patient ID: Amy Leonard, female   DOB: [birth year], 52 y.o.   MRN: [id number] History: This patient underwent bilateral partial mastectomy with needle localization, right axillary lymph node dissection, left axillary sentinel node  biopsy, and Port-A-Cath insertion on 11/23/2012.  On the right side she has invasive ductal carcinoma, grade 3, triple negative breast cancer. In situ component is 1 mm from the lateral margin. Pathologic stage T1c., N0, triple negative breast cancer. Right axilla dissection had to be done because the blue dye and radionuclide did not map into the lymph nodes.  On the left side she had invasive ductal carcinoma, grade 1, receptor positive, HER-2-negative. There was tumor at the posterior margin which was recognized, and the reexcised margin is negative. 1/1 sentinel nodes was positive for microscopic metastatic disease.  I have discussed her pathology report with her and her daughter. She is doing reasonably well. She is draining greater than 60 cc per day of serous fluid from the right axilla and so the drain will need to remain. She has appointment with Dr. Welton Flakes and with the chemotherapy class later this week. She is anticipating adjuvant chemotherapy to be followed by adjuvant radiation therapy.    Exam: Patient looks well. Positive. No distress. Daughter is with her. Bilateral partial mastectomy incisions and bilateral axillary incisions are healing normally. No hematoma. No infection. No skin necrosis. Good symmetry. Good contour. Good nipple projection. Drain site all right side is clean.    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 Close but negative negative DCIS margin laterally. Recovering uneventfully following right partial mastectomy and right axillary lymph node dissection.  Invasive ductal carcinoma left breast, receptor positive, HER-2-negative, resected to negative margins. Pathologic stage T1b, N109mi,  . Recovering uneventfully following left partial mastectomy and sentinel lymph biopsy.  Status post Port-A-Cath insertion, no apparent complications.    Plan: Drain is left in place. Return to office in 7-10 days for wound and drain check. Remove drain once it is less than 20 cc per day Keep appt. With  Dr. Drue Second and with the chemotherapy class.    Angelia Mould. Derrell Lolling, M.D., [facility] General and Minimally invasive Surgery Breast and Colorectal Surgery Office:   [phone] Pager:   [phone]

## 2012-12-03 ENCOUNTER — Other Ambulatory Visit: Payer: BC Managed Care – PPO

## 2012-12-03 ENCOUNTER — Encounter: Payer: Self-pay | Admitting: *Deleted

## 2012-12-04 ENCOUNTER — Encounter: Payer: Self-pay | Admitting: Oncology

## 2012-12-04 ENCOUNTER — Telehealth: Payer: Self-pay | Admitting: Oncology

## 2012-12-04 ENCOUNTER — Telehealth: Payer: Self-pay | Admitting: *Deleted

## 2012-12-04 ENCOUNTER — Ambulatory Visit (HOSPITAL_BASED_OUTPATIENT_CLINIC_OR_DEPARTMENT_OTHER): Payer: BC Managed Care – PPO | Admitting: Oncology

## 2012-12-04 VITALS — BP 131/83 | HR 80 | Temp 98.5°F | Resp 20 | Ht 63.0 in | Wt 196.1 lb

## 2012-12-04 DIAGNOSIS — Z171 Estrogen receptor negative status [ER-]: Secondary | ICD-10-CM

## 2012-12-04 DIAGNOSIS — Z170 Estrogen receptor positive status [ER+]: Secondary | ICD-10-CM

## 2012-12-04 DIAGNOSIS — C50411 Malignant neoplasm of upper-outer quadrant of right female breast: Secondary | ICD-10-CM

## 2012-12-04 DIAGNOSIS — C50419 Malignant neoplasm of upper-outer quadrant of unspecified female breast: Secondary | ICD-10-CM

## 2012-12-04 DIAGNOSIS — C50119 Malignant neoplasm of central portion of unspecified female breast: Secondary | ICD-10-CM

## 2012-12-04 MED ORDER — LIDOCAINE-PRILOCAINE 2.5-2.5 % EX CREA: TOPICAL_CREAM | CUTANEOUS | Status: DC | PRN

## 2012-12-04 MED ORDER — LORAZEPAM 0.5 MG PO TABS: 0.5000 mg | ORAL_TABLET | Freq: Four times a day (QID) | ORAL | Status: DC | PRN

## 2012-12-04 MED ORDER — PROCHLORPERAZINE 25 MG RE SUPP: 25.0000 mg | Freq: Two times a day (BID) | RECTAL | Status: DC | PRN

## 2012-12-04 MED ORDER — ONDANSETRON HCL 8 MG PO TABS: 8.0000 mg | ORAL_TABLET | Freq: Two times a day (BID) | ORAL | Status: DC | PRN

## 2012-12-04 MED ORDER — PROCHLORPERAZINE MALEATE 10 MG PO TABS: 10.0000 mg | ORAL_TABLET | Freq: Four times a day (QID) | ORAL | Status: DC | PRN

## 2012-12-04 MED ORDER — DEXAMETHASONE 4 MG PO TABS: ORAL_TABLET | ORAL | Status: DC

## 2012-12-04 MED ORDER — CEPHALEXIN 500 MG PO CAPS: 500.0000 mg | ORAL_CAPSULE | Freq: Three times a day (TID) | ORAL | Status: DC

## 2012-12-04 NOTE — Progress Notes (Signed)
15%ind 12/04/12-06/05/13. I will send letter and card to patient. All documents are scanned in.

## 2012-12-04 NOTE — Progress Notes (Signed)
OFFICE PROGRESS NOTE  CC  [name], MD [address] [place] [address], [place] [address] Dr. Claud Kelp  Dr. Antony Blackbird  DIAGNOSIS: 52 year old female with new diagnosis of bilateral breast cancers.   STAGE:  Cancer of upper-outer quadrant of female breast  Primary site: Breast (Bilateral)  Staging method: AJCC 7th Edition  Clinical: Stage IA (T1c, N0, cM0)  Summary: Stage IA (T1c, N0, cM0)   PRIOR THERAPY: #1Patient recently underwent screening mammograms and she was found to have bilateral breast abnormalities.diagnostic mammogram showed in the right breast an ill-defined 2 cm mass with a few associated well defined microcalcifications over the upper outer quadrant. Spot compression images of the left breast demonstrated an ill defined spiculated 8 mm mass centrally. Ultrasound showed irregular bordered heterogeneous hypoechoic solid mass at the 10:00 position of the right breast 6 cm from the nipple measuring 1.1 x 1.3 x 1.4 cm. Also in this location was an adjacent smaller irregular hypoechoic mass measuring 3 x 5 x 6 mm. The smaller mass was 1 cm from the large more irregular mass. Ultrasound of the axilla demonstrated single lymph node with thickened cortex. Ultrasound of the left breast demonstrated an ill-defined hypoechoic mass with distal acoustic shadowing 7 to 8:00 position 3 cm from the nipple located deep and measuring 5 x 8 x 8 mm ultrasound of the left axilla showed normal appearing lymph nodes.   #2Patient went on to have needle core biopsies performed of both of the masses. 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 The tumor was ER positive PR positive with a Ki-67 of 9% the second tumor was ER negative PR negative HER-2/neu negative with Ki-6792% and elevated.  #3 Patient had MRIs of the breasts performed. There was  noted to be 1.7 cm irregular enhancing mass located within the upper-outer quadrant of the right breast with 3 adjacent worrisome enhancing satellite nodules in aggregate the nodules and mass measures 3.8 cm. In the left breast 8 mm irregular enhancing mass located within the central left breast was noted. No evidence of axillary or internal mammary adenopathy.  #4patient is status post bilateral lumpectomies. The right breast revealed a 1.5 cm grade 3 invasive ductal carcinoma ER/PR negative HER-2/neu negative Ki-67 92%. Sentinel node was negative. Left breast showed 1.0 cm grade 3 invasive ductal carcinoma ER/PR positive HER-2/neu negative sentinel node was negative.  #5 patient is to begin adjuvant chemotherapy consisting of Adriamycin Cytoxan every 2 weeks for a total of 4 cycles. This would then be followed by Taxol and carboplatinum weekly for 12 weeks. Patient will need adjuvant radiation therapy. Because one of the tumor is ER positive she will also receive antiestrogen therapy.  CURRENT THERAPY:patient will begin adjuvant a.c. Starting 12/30/2012  INTERVAL HISTORY: Amy Leonard 52 y.o. female returns forfollowup visit after her surgery. We discussed her pathology in detail from both breasts. She understands on the right side she is triple-negative disease node negative. Tumor is high grade. We discussed the risks of recurrent disease without adjuvant chemotherapy versus risk reduction with adjuvant chemotherapy. She is agreeable to get chemotherapy. We discussed the rationale risks and benefits. She is feeling better after having her lumpectomy is no other complaints.  MEDICAL HISTORY: Past Medical History  Diagnosis Date  . Elevated WBC count     nl diff  . Chronic back pain  spinal stenosis  . Tobacco abuse   . Anxiety and depression   . HSV infection     recurrent (side and buttox)  . Migraine   . Glaucoma(365)     ?  Marland Kitchen Carpal tunnel syndrome   . HLD (hyperlipidemia)    . Folliculitis 05/05/2011  . Anxiety   . Depression   . 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 GERD (gastroesophageal reflux disease)   . Hypertension   . Breast cancer   . Wears dentures     top    ALLERGIES:  is allergic to bupropion hcl; chlorhexidine gluconate; hydrocod polst-cpm polst er; lisinopril; and naproxen sodium.  MEDICATIONS:  Current Outpatient Prescriptions  Medication Sig Dispense Refill  . Acetaminophen (TYLENOL ARTHRITIS PAIN PO) Take 2 tablets by mouth as needed.      Marland Kitchen acyclovir (ZOVIRAX) 5 % ointment Apply topically as needed.       Marland Kitchen aspirin 81 MG chewable tablet Chew 81 mg by mouth daily.      Marland Kitchen atorvastatin (LIPITOR) 10 MG tablet Take 1 tablet (10 mg total) by mouth daily.  90 tablet  1  . calcium carbonate (TUMS - DOSED IN MG ELEMENTAL CALCIUM) 500 MG chewable tablet Chew by mouth as directed.        Marland Kitchen losartan (COZAAR) 50 MG tablet Take 1 tablet (50 mg total) by mouth daily.  30 tablet  11  . omeprazole (PRILOSEC) 40 MG capsule Take 1 capsule (40 mg total) by mouth 2 (two) times daily.  60 capsule  11  . oxyCODONE-acetaminophen (PERCOCET) 7.5-325 MG per tablet Take 1 tablet by mouth every 4 (four) hours as needed for pain.  30 tablet  0  . traMADol (ULTRAM) 50 MG tablet Take 1 tablet (50 mg total) by mouth every 8 (eight) hours as needed.  30 tablet  0  . cyclobenzaprine (FLEXERIL) 10 MG tablet Take 10 mg by mouth as needed.        No current facility-administered medications for this visit.    SURGICAL HISTORY:  Past Surgical History  Procedure Laterality Date  . Btl    . Epidural steroid injection  2008  . Colonoscopy    . Upper gi endoscopy    . Partial mastectomy with needle localization and axillary sentinel lymph node bx Bilateral 11/23/2012    Procedure: PARTIAL MASTECTOMY WITH NEEDLE LOCALIZATION AND AXILLARY SENTINEL LYMPH NODE BX;  Surgeon: Ernestene Mention, MD;  Location: [facility];  Service: General;  Laterality: Bilateral;   . Portacath placement Right 11/23/2012    Procedure: INSERTION PORT-A-CATH;  Surgeon: Ernestene Mention, MD;  Location: [facility];  Service: General;  Laterality: Right;    REVIEW OF SYSTEMS:  Pertinent items are noted in HPI.   HEALTH MAINTENANCE:  PHYSICAL EXAMINATION: Blood pressure 131/83, pulse 80, temperature 98.5 F (36.9 C), temperature source Oral, resp. rate 20, height 5\' 3"  (1.6 m), weight 196 lb 1.6 oz (88.95 kg). Body mass index is 34.75 kg/(m^2). ECOG PERFORMANCE STATUS: 0 - Asymptomatic Well-developed nourished female in no acute distress HEENT exam: EOMI PERRLA sclerae anicteric no conjunctival pallor oral mucosa is moist neck supple no palpable lymphadenopathy lungs are clear to auscultation cardiovascular regular rate rhythm abdomen is soft nontender nondistended bowel sounds are present no HSM extremities no edema neuro patient's alert oriented otherwise nonfocal bilateral breast examination feeling surgical scars no nodularity no masses.   LABORATORY DATA: Lab Results  Component Value Date   WBC 10.6*  11/04/2012   HGB 13.4 11/04/2012   HCT 40.0 11/04/2012   MCV 93.4 11/04/2012   PLT 259 11/04/2012      Chemistry      Component Value Date/Time   NA 139 11/04/2012 1217   NA 140 08/24/2012 0805   K 3.9 11/04/2012 1217   K 4.3 08/24/2012 0805   CL 107 11/04/2012 1217   CL 104 08/24/2012 0805   CO2 25 11/04/2012 1217   CO2 29 08/24/2012 0805   BUN 15.3 11/04/2012 1217   BUN 13 08/24/2012 0805   CREATININE 0.7 11/04/2012 1217   CREATININE 0.8 08/24/2012 0805      Component Value Date/Time   CALCIUM 9.2 11/04/2012 1217   CALCIUM 8.9 08/24/2012 0805   ALKPHOS 64 11/04/2012 1217   ALKPHOS 44 08/24/2012 0805   AST 14 11/04/2012 1217   AST 15 08/24/2012 0805   ALT 18 11/04/2012 1217   ALT 18 08/24/2012 0805   BILITOT 0.33 11/04/2012 1217   BILITOT 0.7 08/24/2012 0805    ADDITIONAL INFORMATION: 1. CHROMOGENIC IN-SITU HYBRIDIZATION Results: HER-2/NEU BY  CISH - NO AMPLIFICATION OF HER-2 DETECTED. RESULT RATIO OF HER2: CEP 17 SIGNALS 1.00 AVERAGE HER2 COPY NUMBER PER CELL 1.55 REFERENCE RANGE NEGATIVE HER2/Chr17 Ratio <2.0 and Average HER2 copy number <4.0 EQUIVOCAL HER2/Chr17 Ratio <2.0 and Average HER2 copy number 4.0 and <6.0 POSITIVE HER2/Chr17 Ratio >=2.0 and/or Average HER2 copy number >=6.0 Pecola Leisure MD Pathologist, Electronic Signature ( Signed 12/01/2012) 3. CHROMOGENIC IN-SITU HYBRIDIZATION Results: HER-2/NEU BY CISH - NO AMPLIFICATION OF HER-2 DETECTED. RESULT RATIO OF HER2: CEP 17 SIGNALS 1.22 AVERAGE HER2 COPY NUMBER PER CELL 1.40 REFERENCE RANGE NEGATIVE HER2/Chr17 Ratio <2.0 and Average HER2 copy number <4.0 EQUIVOCAL HER2/Chr17 Ratio <2.0 and Average HER2 copy number 4.0 and <6.0 POSITIVE HER2/Chr17 Ratio >=2.0 and/or Average HER2 copy number >=6.0 1 of 5 FINAL for MAYSA, LYNN ([id number]) ADDITIONAL INFORMATION:(continued) Pecola Leisure MD Pathologist, Electronic Signature ( Signed 12/01/2012) FINAL DIAGNOSIS Diagnosis 1. Breast, lumpectomy, Right - INVASIVE DUCTAL CARCINOMA, GRADE III, SEE COMMENT. - INVASIVE CARCINOMA IS 0.9 CM FROM NEAREST MARGIN (INFERIOR). - NO LYMPHOVASCULAR INVASION IDENTIFIED. - HIGH GRADE DUCTAL CARCINOMA IN SITU - IN SITU CARCINOMA IS 1 MM FROM THE NEAREST LATERAL MARGIN - SEE TUMOR SYNOPTIC TEMPLATE BELOW. 2. Lymph nodes, regional resection, Right axilla - ELEVEN LYMPH NODES, NEGATIVE FOR TUMOR (0/11). 3. Breast, lumpectomy, Left - INVASIVE DUCTAL CARCINOMA, GRADE I, SEE COMMENT. - LYMPHOVASCULAR INVASION IDENTIFIED - INVASIVE CARCINOMA IS BROADLY PRESENT AT POSTERIOR MARGIN - DUCTAL CARCINOMA IN SITU, GRADE I. - IN SITU CARCINOMA IS FOCALLY PRESENT AT POSTERIOR MARGIN - SEE TUMOR SYNOPTIC TEMPLATE BELOW. 4. Breast, excision, Left - BENIGN BREAST TISSUE, SEE COMMENT. - NEGATIVE FOR ATYPIA OR MALIGNANCY. - SURGICAL MARGIN, NEGATIVE FOR ATYPIA OR MALIGNANCY. 5. Lymph  node, sentinel, biopsy, Left axillary - ONE LYMPH NODE, POSITIVE FOR METASTATIC MAMMARY CARCINOMA (1/1). Microscopic Comment 1. BREAST, INVASIVE TUMOR, WITH LYMPH NODE SAMPLING Specimen, including laterality: Right breast Procedure: Lumpectomy Grade: III of III Tubule formation: 3 Nuclear pleomorphism: 3 Mitotic: 3 Tumor size (gross measurement: 1.5 cm Margins: Invasive, distance to closest margin: 0.9 cm In-situ, distance to closest margin: 0.1 cm If margin positive, focally or broadly: N/A Lymphovascular invasion: Absent Ductal carcinoma in situ: Present Grade: III of III Extensive intraductal component: Absent Lobular neoplasia: Absent Tumor focality: Multifocal Treatment effect: None 2 of 5 FINAL for Amy Leonard, Amy Leonard ([id number]) Microscopic Comment(continued) If present, treatment effect in breast tissue, lymph nodes or both:  N/A Extent of tumor: Skin: N/A Nipple: N/A Skeletal muscle: N/A Lymph nodes: # examined: 11 (Part 2) Lymph nodes with metastasis: 0 Breast prognostic profile: Estrogen receptor: Repeated, previous study demonstrated 0% positivity ([id number]) Progesterone receptor: Repeated, previous study demonstrated 0% positivity ([id number]) Her 2 neu: Repeated, previous study demonstrated no amplification (1.38) ([id number]) Ki-67: Not repeated, previous study demonstrated 92% proliferation rate ([id number]) Non-neoplastic breast: Previous biopsy site, fibrocystic change, sclerosing adenosis and benign calcifications in benign ducts and lobules. TNM: pT1c, pN0, pMX Comments: There are a total of four distinct areas of abnormality identified. The first 1.5 cm corresponds to the invasive ductal carcinoma. The second 0.9 cm area consists of high grade ductal carcinoma in situ with associated fibrocystic change and fibroadenomatoid nodules. In this focus, the in situ carcinoma is 1 mm from the nearest lateral margin. The third 0.4 cm area consists of fat  necrosis and fibrosis. There are no atypical or malignant findings in this area. The fourth and final 0.3 cm area demonstrate non-neoplastic findings to include fibrocystic change. There are no atypical or malignant epithelial findings in this area either. 3. BREAST, INVASIVE TUMOR, WITH LYMPH NODE SAMPLING Specimen, including laterality: Left breast Procedure: Lumpectomy Grade: I of III Tubule formation: 3 Nuclear pleomorphism: 3 Mitotic: 1 Tumor size (gross measurement): 1.0 cm Margins: Invasive, distance to closest margin: Present at posterior In-situ, distance to closest margin: Present at posterior If margin positive, focally or broadly: Broadly (invasive) and focally (in situ) Lymphovascular invasion: Present Ductal carcinoma in situ: Present Grade: I of III Extensive intraductal component: Absent Lobular neoplasia: Absent Tumor focality: Unifocal Treatment effect: None If present, treatment effect in breast tissue, lymph nodes or both: N/A Extent of tumor: Skin: N/A Nipple: N/A Skeletal muscle: N/A Lymph nodes: # examined: 1 (Part 5) Lymph nodes with metastasis: 1 Isolated tumor cells (< 0.2 mm): 0 Micrometastasis: (> 0.2 mm and < 2.0 mm): 1 Macrometastasis: (> 2.0 mm): 0 3 of 5 FINAL for Amy Leonard, Amy Leonard ([id number]) Microscopic Comment(continued) Extracapsular extension: Present Breast prognostic profile: Estrogen receptor: Not repeated, previous study demonstrated 100% positivity ([id number]). Progesterone receptor: Not repeated, previous study demonstrated 25% positivity ([id number]) Her 2 neu: Repeated, previous study demonstrated no amplification (1.42) ([id number]) Ki-67: Not repeated, previous study demonstrated 9% proliferation rate ([id number]) Non-neoplastic breast: Previous biopsy site, fibrocystic change and microcalcifications in benign ducts and lobules. TNM: pT1b, pN53mi, PMX Comments: The additional 1.0 cm and 1.1 cm hemorrhagic areas grossly  identified consist of benign breast tissue with microcalcifications and parenchymal hemorrhage. There are no atypical or malignant findings in these areas. 4. The surgical resection margin(s) of the specimen were inked and microscopically evaluated. There is no mass grossly identified. Representative sections demonstrate predominantly benign fibrovascular and adipose tissue with focal areas of fibrocytic change and sclerosing adenosis. There are no atypical or malignant epithelial or stromal findings identified. 5. There are microscopic foci of intranodal metastatic mammary tumor deposits spanning up to 1 mm. There is focal extracapsular extension identified. (CR:kh 11-25-12) [place] RUND DO Pathologist, Electronic Signature (Case signed 11/25/2012) Specimen Gross and Clinical Information   RADIOGRAPHIC STUDIES:  Chest 2 View  11/19/2012  *RADIOLOGY REPORT*  Clinical Data: Preop for breast cancer  CHEST - 2 VIEW  Comparison: None.  Findings: Cardiomediastinal silhouette is unremarkable.  No acute infiltrate or pleural effusion.  No pulmonary edema.  Bony thorax is unremarkable.  IMPRESSION: No active disease.   Original Report Authenticated By: Natasha Mead, M.D.    Nm Sentinel Node Inj-no Rpt (  breast)  11/23/2012  CLINICAL DATA: Bilateral breast cancer   Sulfur colloid was injected intradermally by the nuclear medicine  technologist for breast cancer sentinel node localization.     Dg Chest Portable 1 View  11/23/2012  *RADIOLOGY REPORT*  Clinical Data: 52 year old female status post Port-A-Cath placement.  Breast cancer.  PORTABLE CHEST - 1 VIEW  Comparison: 11/19/2012.  Findings: Portable semi upright AP view 1441 hours.  Right chest subclavian approach Port-A-Cath placed.  Acute angulation of the catheter tubing at the level of the right axilla.  Tip at the level of the cavoatrial junction.  Interval postoperative changes to the right chest wall with subcutaneous appearing drain in place.  No  pneumothorax.  No pleural effusion or pulmonary edema.  Mildly increased bibasilar opacity compatible with atelectasis.  Cardiac size and mediastinal contours are within normal limits.  Visualized tracheal air column is within normal limits.  IMPRESSION: 1.  Right subclavian approach chest Port-A-Cath in place.  Tip at the level of the cavoatrial junction.  Mildly angulated catheter tubing at the level of the axilla. 2.  Interval postoperative changes to the right chest wall. No pneumothorax. 3.  Mild dependent atelectasis.   Original Report Authenticated By: Erskine Speed, M.D.    Dg Fluoro Guide Cv Line-no Report  11/23/2012  CLINICAL DATA: bil breast cancer   FLOURO GUIDE CV LINE  Fluoroscopy was utilized by the requesting physician.  No radiographic  interpretation.     Mm Lt Plc Breast Loc Dev   1st Lesion  Inc Mammo Guide  11/23/2012  *RADIOLOGY REPORT*  Clinical Data:  Left breast cancer  NEEDLE LOCALIZATION WITH MAMMOGRAPHIC GUIDANCE AND SPECIMEN RADIOGRAPH  Comparison:  Previous exams.  Patient presents for needle localization prior to surgery.  I met with the patient and we discussed the procedure of needle localization including benefits and alternatives. We discussed the high likelihood of a successful procedure. We discussed the risks of the procedure, including infection, bleeding, tissue injury, and further surgery. Informed, written consent was given.  Using mammographic guidance, sterile technique, 2% lidocaine and a #9 modified Kopans needle, the mass and clip in the central aspect of the left breast was localized using a superior approach.  The films are marked for Dr. Derrell Lolling.  Specimen radiograph was performed at Day Surgery, and confirms the mass and clip were present in the tissue sample. The clip was located at the margin of the surgical specimen.  Dr.  Derrell Lolling states she had taken more tissue at the margin to clear the border.  The specimen is marked for pathology.  IMPRESSION: Needle  localization of the left breast.  No apparent complications.   Original Report Authenticated By: Baird Lyons, M.D.    Mm Rt Plc Breast Loc Dev   1st Lesion  Inc Mammo Guide  11/23/2012  *RADIOLOGY REPORT*  Clinical Data:  Known right breast cancer  NEEDLE LOCALIZATION WITH MAMMOGRAPHIC GUIDANCE AND SPECIMEN RADIOGRAPH  Comparison:  Previous exams.  Patient presents for needle localization prior to surgery.  I met with the patient and we discussed the procedure of needle localization including benefits and alternatives. We discussed the high likelihood of a successful procedure. We discussed the risks of the procedure, including infection, bleeding, tissue injury, and further surgery. Informed, written consent was given.  Using mammographic guidance, sterile technique, 2% lidocaine and a #7 and #9 modified Kopans needles, the mass and nodularity in the upper outer quadrant of the right breast was bracketedusing a lateral approach.  The films are marked for Dr. Derrell Lolling.  Specimen radiograph was performed at Day Surgery, and confirms the mass and clip are present in the tissue sample.  The specimen is marked for pathology.  IMPRESSION: Needle localization of the right breast.  No apparent complications.   Original Report Authenticated By: Baird Lyons, M.D.     ASSESSMENT: 52 year old female with  #1 bilateral breast cancers triple negative on the right side ER positive on the left side. She is status post bilateral lumpectomies. Clinically she's doing well today. We discussed adjuvant chemotherapy today. We discussed Adriamycin Cytoxan given dose dense x4 cycles followed by Taxol carbo x12 cycles. We discussed rationale risks benefits and side effects.  #2 patient has had an echocardiogram Port-A-Cath placed and chemotherapy teaching class.   PLAN:   #1 patient will return on 12/30/2012 for cycle 1 of chemotherapy.   All questions were answered. The patient knows to call the clinic with any problems,  questions or concerns. We can certainly see the patient much sooner if necessary.  I spent 25 minutes counseling the patient face to face. The total time spent in the appointment was 30 minutes.    Drue Second, MD Medical/Oncology [facility] [phone] (beeper) [phone] (Office)  12/04/2012, 9:07 AM

## 2012-12-04 NOTE — Patient Instructions (Addendum)
We discussed your final pathology  We discussed treatments with chemotherapy with AC every 2 weeks beginning on 5/7  I will see you back on 5/7 to begin first treatment

## 2012-12-04 NOTE — Telephone Encounter (Signed)
Per staff phone call and POF I have schedueld appts.  JMW  

## 2012-12-07 ENCOUNTER — Other Ambulatory Visit: Payer: Self-pay | Admitting: Certified Registered Nurse Anesthetist

## 2012-12-07 ENCOUNTER — Other Ambulatory Visit: Payer: Self-pay

## 2012-12-07 DIAGNOSIS — Z1211 Encounter for screening for malignant neoplasm of colon: Secondary | ICD-10-CM

## 2012-12-07 DIAGNOSIS — R1310 Dysphagia, unspecified: Secondary | ICD-10-CM

## 2012-12-07 DIAGNOSIS — K2100 Gastro-esophageal reflux disease with esophagitis, without bleeding: Secondary | ICD-10-CM

## 2012-12-07 DIAGNOSIS — R1319 Other dysphagia: Secondary | ICD-10-CM

## 2012-12-07 DIAGNOSIS — D126 Benign neoplasm of colon, unspecified: Secondary | ICD-10-CM

## 2012-12-07 MED ORDER — OMEPRAZOLE 40 MG PO CPDR: 40.0000 mg | DELAYED_RELEASE_CAPSULE | Freq: Two times a day (BID) | ORAL | Status: DC

## 2012-12-09 ENCOUNTER — Other Ambulatory Visit: Payer: Self-pay | Admitting: Family Medicine

## 2012-12-09 MED ORDER — LOSARTAN POTASSIUM 50 MG PO TABS: 50.0000 mg | ORAL_TABLET | Freq: Every day | ORAL | Status: DC

## 2012-12-10 ENCOUNTER — Encounter (INDEPENDENT_AMBULATORY_CARE_PROVIDER_SITE_OTHER): Payer: Self-pay | Admitting: General Surgery

## 2012-12-10 ENCOUNTER — Ambulatory Visit (INDEPENDENT_AMBULATORY_CARE_PROVIDER_SITE_OTHER): Payer: BC Managed Care – PPO | Admitting: General Surgery

## 2012-12-10 VITALS — BP 122/80 | HR 90 | Temp 97.2°F | Resp 18 | Ht 63.0 in | Wt 193.0 lb

## 2012-12-10 DIAGNOSIS — C50411 Malignant neoplasm of upper-outer quadrant of right female breast: Secondary | ICD-10-CM

## 2012-12-10 DIAGNOSIS — C50419 Malignant neoplasm of upper-outer quadrant of unspecified female breast: Secondary | ICD-10-CM

## 2012-12-10 DIAGNOSIS — C50112 Malignant neoplasm of central portion of left female breast: Secondary | ICD-10-CM

## 2012-12-10 DIAGNOSIS — C50119 Malignant neoplasm of central portion of unspecified female breast: Secondary | ICD-10-CM

## 2012-12-10 NOTE — Patient Instructions (Signed)
The drain was removed from your right breast today. You may start taking a shower tomorrow morning. Put a small bandage on this for a few days until the drainage stops.  Take the antibiotics until they are all gone. I'm not sure whether you have an infection or not, but we do not want to take a chance.  You will be referred to physical therapy to get your right shoulder range of motion back to 100%.  Return to see Dr. Derrell Lolling in 6 weeks.

## 2012-12-10 NOTE — Progress Notes (Signed)
Patient ID: Amy Leonard, female   DOB: 1961-03-24, 52 y.o.   MRN: [id number] History: This patient underwent bilateral partial mastectomy with needle localization, right axillary lymph node dissection, left axillary sentinel node biopsy, and Port-A-Cath insertion on 11/23/2012.  On the right side she has invasive ductal carcinoma, grade 3, triple negative breast cancer. In situ component is 1 mm from the lateral margin. Pathologic stage T1c., N0, triple negative breast cancer. Right axilla dissection had to be done because the blue dye and radionuclide did not map into the lymph nodes.  On the left side she had invasive ductal carcinoma, grade 1, receptor positive, HER-2-negative. There was tumor at the posterior margin which was recognized, and the reexcised margin is negative. 1/1 sentinel nodes was positive for microscopic metastatic disease.   The drainage is down to approximately 15 cc per day. She saw Dr. Welton Flakes last week who thought there might be some erythema and start her on Keflex. She says this has helped somewhat. No fever or chills. She is going to see Dr. Welton Flakes on May 7 and we'll probably begin chemotherapy sometime in May.     Exam: Patient looks well. No distress. Afebrile. No tachycardia. Bilateral partial mastectomy incisions and bilateral axillary incisions are healing normally. There is faint erythema and faint edema of bilateral breast. No hematoma or abscess. Appearance. No odor. Drain was removed and right axilla redressed.   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 Close but negative DCIS margin laterally. Recovering uneventfully following right partial mastectomy and right axillary lymph node dissection.  Invasive ductal carcinoma left breast, receptor positive, HER-2-negative, resected to negative margins. Pathologic stage T1b, N80mi, . Recovering uneventfully following left partial mastectomy and sentinel lymph biopsy.   Status post Port-A-Cath insertion, no apparent complications Possible low-grade cellulitis of right breast. On Keflex.   Plan: Right drain removed. May shower tomorrow Refer physical therapy See Dr. Welton Flakes May 7 See me in 6 weeks     Amy Leonard. Derrell Lolling, M.D., [facility] General and Minimally invasive Surgery Breast and Colorectal Surgery Office:   [phone] Pager:   [phone]

## 2012-12-11 ENCOUNTER — Telehealth: Payer: Self-pay | Admitting: Family Medicine

## 2012-12-11 NOTE — Telephone Encounter (Signed)
Patient calling to make follow-up appointment for Monday 4/21 in office.  Fell stepping down from side walk to pavement yesterday 4/17 coming out of office.  Twisted Left ankle, hurt to walk so seen in [facility] and had Xrays.  Told bad sprain stage 1 or 2.  Was given anti-inflammatory Meloxicam, started applying ice, elevating foot, instructed to make Appt in office for follow up in 2-3 Days.  Is feeling better today 4/18, able to put weight on heel today, following instructions, just wanting to make appt.  Transferred to office/Carolyn for appt.

## 2012-12-14 ENCOUNTER — Ambulatory Visit (INDEPENDENT_AMBULATORY_CARE_PROVIDER_SITE_OTHER): Payer: BC Managed Care – PPO | Admitting: Family Medicine

## 2012-12-14 ENCOUNTER — Encounter: Payer: Self-pay | Admitting: Family Medicine

## 2012-12-14 VITALS — BP 134/78 | HR 85 | Temp 98.6°F | Ht 63.0 in | Wt 196.2 lb

## 2012-12-14 DIAGNOSIS — S93409A Sprain of unspecified ligament of unspecified ankle, initial encounter: Secondary | ICD-10-CM

## 2012-12-14 DIAGNOSIS — S93402A Sprain of unspecified ligament of left ankle, initial encounter: Secondary | ICD-10-CM | POA: Insufficient documentation

## 2012-12-14 NOTE — Patient Instructions (Addendum)
I think your ankle sprain is improving slowly  Elevate your foot whenever you can and [place] a cold compress whenever you can for 10 minutes at a time  If needed- get an inexpensive cane to use on your left side to take some of the weight off the ankle If no further improvement in 2 weeks -please let me know (or if worse) Continue to use the air cast

## 2012-12-14 NOTE — Telephone Encounter (Signed)
She was seen in the office

## 2012-12-14 NOTE — Assessment & Plan Note (Signed)
Improving - day 5 - less swelling/ still tender and hurts after walking See AVS- suggested cane to help with ambulation/continue air cast/ ice and elevation May be ready for some ankle exercises 1-2 weeks-will update  If worse - adv to call  Sent for UC note and xray to review

## 2012-12-14 NOTE — Progress Notes (Signed)
Subjective:    Patient ID: Amy Leonard, female    DOB: May 06, 1961, 52 y.o.   MRN: [id number]  HPI Here for f/u from urgent care  Went to get her drainage tube out of breast on Thursday Twisted her ankle in the parking lot L Went to urgent care the next day  Her ankle swelled a lot and quickly - and some bruising  Gave her ace bandage and also air cast  (some support)  Xray ok -no broken   Now is having more pain - over lateral foot and ankle  Inversion injury and skinned knee too   The swelling is down some Using ice and elevation    Patient Active Problem List  Diagnosis  . HERPES SIMPLEX INFECTION  . LACTOSE INTOLERANCE  . PURE HYPERCHOLESTEROLEMIA  . ANXIETY DEPRESSION  . TOBACCO USE  . MIGRAINE VARIANT  . CARPAL TUNNEL SYNDROME  . UNSPECIFIED GLAUCOMA  . ESSENTIAL HYPERTENSION  . SPINAL STENOSIS  . BACK PAIN, CHRONIC  . Edema  . History of falling  . Other screening mammogram  . Lacunar infarction  . Basal ganglia infarction  . Routine general medical examination at a health care facility  . Routine gynecological examination  . Dysphagia  . Colon cancer screening  . Abnormal mammogram  . Cancer of upper-outer quadrant of female breast  . Cancer of central portion of female breast   Past Medical History  Diagnosis Date  . Elevated WBC count     nl diff  . Chronic back pain     spinal stenosis  . Tobacco abuse   . Anxiety and depression   . HSV infection     recurrent (side and buttox)  . Migraine   . Glaucoma(365)     ?  Marland Kitchen Carpal tunnel syndrome   . HLD (hyperlipidemia)   . Folliculitis 05/05/2011  . Anxiety   . Depression   . 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 GERD (gastroesophageal reflux disease)   . Hypertension   . Breast cancer   . Wears dentures     top   Past Surgical History  Procedure Laterality Date  . Btl    . Epidural steroid injection  2008  . Colonoscopy    . Upper gi endoscopy    . Partial mastectomy with needle  localization and axillary sentinel lymph node bx Bilateral 11/23/2012    Procedure: PARTIAL MASTECTOMY WITH NEEDLE LOCALIZATION AND AXILLARY SENTINEL LYMPH NODE BX;  Surgeon: Ernestene Mention, MD;  Location: [facility];  Service: General;  Laterality: Bilateral;  . Portacath placement Right 11/23/2012    Procedure: INSERTION PORT-A-CATH;  Surgeon: Ernestene Mention, MD;  Location: [facility];  Service: General;  Laterality: Right;   History  Substance Use Topics  . Smoking status: Current Every Day Smoker -- 0.50 packs/day    Types: Cigarettes  . Smokeless tobacco: Never Used     Comment: 1 1/2 ppd -form given 10-07-12  . Alcohol Use: Yes     Comment: 4-5 beers fri and sat night-does this rarely   Family History  Problem Relation Age of Onset  . Stroke Maternal Grandmother   . Diabetes Mother   . Hypertension Mother   . Leukemia Mother   . Obesity Mother   . Depression Sister   . Bipolar disorder Son     Bipolar / oppositional defiant  . Alcohol abuse Sister   . Drug abuse Sister   . Drug abuse Brother   .  Alcohol abuse Brother   . Alcohol abuse Sister   . Drug abuse Sister   . Colon cancer Neg Hx   . Diabetes Maternal Grandmother    Allergies  Allergen Reactions  . Bupropion Hcl Nausea Only and Other (See Comments)     GI, sleepy  . Chlorhexidine Gluconate Itching and Rash  . Hydrocod Polst-Cpm Polst Er Nausea And Vomiting    vomiting  . Lisinopril Other (See Comments)    Leg cramps    . Naproxen Sodium Other (See Comments)    does not tolerate   Current Outpatient Prescriptions on File Prior to Visit  Medication Sig Dispense Refill  . Acetaminophen (TYLENOL ARTHRITIS PAIN PO) Take 2 tablets by mouth as needed.      Marland Kitchen acyclovir (ZOVIRAX) 5 % ointment Apply topically as needed.       Marland Kitchen aspirin 81 MG chewable tablet Chew 81 mg by mouth daily.      Marland Kitchen atorvastatin (LIPITOR) 10 MG tablet Take 1 tablet (10 mg total) by mouth daily.  90 tablet  1   . calcium carbonate (TUMS - DOSED IN MG ELEMENTAL CALCIUM) 500 MG chewable tablet Chew by mouth as directed.        . cephALEXin (KEFLEX) 500 MG capsule Take 1 capsule (500 mg total) by mouth 3 (three) times daily.  30 capsule  0  . cyclobenzaprine (FLEXERIL) 10 MG tablet Take 10 mg by mouth as needed.       Marland Kitchen dexamethasone (DECADRON) 4 MG tablet Take 2 tablets by mouth once a day on the day after chemotherapy and then take 2 tablets two times a day for 2 days. Take with food.  30 tablet  1  . lidocaine-prilocaine (EMLA) cream Apply topically as needed.  30 g  8  . LORazepam (ATIVAN) 0.5 MG tablet Take 1 tablet (0.5 mg total) by mouth every 6 (six) hours as needed (Nausea or vomiting).  30 tablet  0  . losartan (COZAAR) 50 MG tablet Take 1 tablet (50 mg total) by mouth daily.  90 tablet  2  . omeprazole (PRILOSEC) 40 MG capsule Take 1 capsule (40 mg total) by mouth 2 (two) times daily.  180 capsule  3  . ondansetron (ZOFRAN) 8 MG tablet Take 1 tablet (8 mg total) by mouth 2 (two) times daily as needed. Take two times a day as needed for nausea or vomiting starting on the third day after chemotherapy.  30 tablet  1  . oxyCODONE-acetaminophen (PERCOCET) 7.5-325 MG per tablet Take 1 tablet by mouth every 4 (four) hours as needed for pain.  30 tablet  0  . prochlorperazine (COMPAZINE) 10 MG tablet Take 1 tablet (10 mg total) by mouth every 6 (six) hours as needed (Nausea or vomiting).  30 tablet  1  . prochlorperazine (COMPAZINE) 25 MG suppository Place 1 suppository (25 mg total) rectally every 12 (twelve) hours as needed for nausea.  12 suppository  3  . traMADol (ULTRAM) 50 MG tablet Take 1 tablet (50 mg total) by mouth every 8 (eight) hours as needed.  30 tablet  0   No current facility-administered medications on file prior to visit.      Review of Systems     Objective:   Physical Exam  Constitutional: She appears well-developed and well-nourished. No distress.  HENT:  Head:  Normocephalic and atraumatic.  Eyes: Conjunctivae and EOM are normal. Pupils are equal, round, and reactive to light.  Neck: Normal range of motion.  Neck supple.  Cardiovascular: Normal rate and regular rhythm.   Pulmonary/Chest: Effort normal and breath sounds normal.  Musculoskeletal: She exhibits edema and tenderness.       Left ankle: She exhibits decreased range of motion and swelling. She exhibits no ecchymosis, no deformity and normal pulse. Tenderness. Lateral malleolus, posterior TFL and head of 5th metatarsal tenderness found. Achilles tendon normal.  Pain on inversion of ankle Neg talar tilt  Neurological: She is alert. She has normal strength and normal reflexes. She displays no atrophy. No cranial nerve deficit or sensory deficit. She exhibits normal muscle tone. Coordination normal.  Skin: Skin is warm and dry. No erythema.  Psychiatric: She has a normal mood and affect.          Assessment & Plan:  Review of Systems  Constitutional: Negative for fever, appetite change,  and unexpected weight change.  Eyes: Negative for pain and visual disturbance.  Respiratory: Negative for cough and shortness of breath.  pos for cw soreness after back surgery Cardiovascular: Negative for cp or palpitations    Gastrointestinal: Negative for nausea, diarrhea and constipation.  Genitourinary: Negative for urgency and frequency.  Skin: Negative for pallor or rash   Msk pos for ankle pain L , pos for chronic back pain  Neurological: Negative for weakness, light-headedness, numbness and headaches.  Hematological: Negative for adenopathy. Does not bruise/bleed easily.  Psychiatric/Behavioral: Negative for dysphoric mood. The patient is not nervous/anxious.

## 2012-12-14 NOTE — Telephone Encounter (Signed)
Appt scheduled for today at 10:30

## 2012-12-15 ENCOUNTER — Ambulatory Visit: Admit: 2012-12-15 | Payer: Self-pay | Admitting: General Surgery

## 2012-12-15 SURGERY — MASTECTOMY WITH SENTINEL LYMPH NODE BIOPSY
Anesthesia: General | Laterality: Left

## 2012-12-22 ENCOUNTER — Encounter: Payer: Self-pay | Admitting: *Deleted

## 2012-12-22 NOTE — Progress Notes (Signed)
Mailed after appt letter to pt. 

## 2012-12-30 ENCOUNTER — Other Ambulatory Visit (HOSPITAL_BASED_OUTPATIENT_CLINIC_OR_DEPARTMENT_OTHER): Payer: BC Managed Care – PPO | Admitting: Lab

## 2012-12-30 ENCOUNTER — Encounter: Payer: Self-pay | Admitting: Oncology

## 2012-12-30 ENCOUNTER — Ambulatory Visit (HOSPITAL_BASED_OUTPATIENT_CLINIC_OR_DEPARTMENT_OTHER): Payer: BC Managed Care – PPO

## 2012-12-30 ENCOUNTER — Other Ambulatory Visit (HOSPITAL_COMMUNITY): Payer: BC Managed Care – PPO

## 2012-12-30 ENCOUNTER — Ambulatory Visit (HOSPITAL_BASED_OUTPATIENT_CLINIC_OR_DEPARTMENT_OTHER): Payer: BC Managed Care – PPO | Admitting: Oncology

## 2012-12-30 ENCOUNTER — Telehealth: Payer: Self-pay | Admitting: Medical Oncology

## 2012-12-30 ENCOUNTER — Encounter (HOSPITAL_COMMUNITY): Payer: BC Managed Care – PPO

## 2012-12-30 ENCOUNTER — Other Ambulatory Visit: Payer: Self-pay | Admitting: Oncology

## 2012-12-30 VITALS — BP 150/83 | HR 82 | Temp 98.4°F | Resp 20 | Ht 63.0 in | Wt 193.4 lb

## 2012-12-30 DIAGNOSIS — C50919 Malignant neoplasm of unspecified site of unspecified female breast: Secondary | ICD-10-CM

## 2012-12-30 DIAGNOSIS — Z5111 Encounter for antineoplastic chemotherapy: Secondary | ICD-10-CM

## 2012-12-30 DIAGNOSIS — C50411 Malignant neoplasm of upper-outer quadrant of right female breast: Secondary | ICD-10-CM

## 2012-12-30 DIAGNOSIS — Z170 Estrogen receptor positive status [ER+]: Secondary | ICD-10-CM

## 2012-12-30 DIAGNOSIS — Z171 Estrogen receptor negative status [ER-]: Secondary | ICD-10-CM

## 2012-12-30 DIAGNOSIS — C50419 Malignant neoplasm of upper-outer quadrant of unspecified female breast: Secondary | ICD-10-CM

## 2012-12-30 LAB — COMPREHENSIVE METABOLIC PANEL (CC13)
Alkaline Phosphatase: 69 U/L (ref 40–150)
BUN: 12.5 mg/dL (ref 7.0–26.0)
CO2: 23 mEq/L (ref 22–29)
Creatinine: 0.7 mg/dL (ref 0.6–1.1)
Glucose: 120 mg/dl — ABNORMAL HIGH (ref 70–99)
Sodium: 139 mEq/L (ref 136–145)
Total Bilirubin: 0.33 mg/dL (ref 0.20–1.20)

## 2012-12-30 LAB — CBC WITH DIFFERENTIAL/PLATELET
Basophils Absolute: 0 10*3/uL (ref 0.0–0.1)
Eosinophils Absolute: 0.1 10*3/uL (ref 0.0–0.5)
HCT: 40 % (ref 34.8–46.6)
LYMPH%: 32.6 % (ref 14.0–49.7)
MCV: 92.8 fL (ref 79.5–101.0)
MONO#: 0.8 10*3/uL (ref 0.1–0.9)
MONO%: 8.4 % (ref 0.0–14.0)
NEUT#: 5.2 10*3/uL (ref 1.5–6.5)
NEUT%: 57.3 % (ref 38.4–76.8)
Platelets: 239 10*3/uL (ref 145–400)
RBC: 4.31 10*6/uL (ref 3.70–5.45)

## 2012-12-30 MED ORDER — DEXAMETHASONE SODIUM PHOSPHATE 20 MG/5ML IJ SOLN
12.0000 mg | Freq: Once | INTRAMUSCULAR | Status: AC
  Administered 2012-12-30: 12 mg via INTRAVENOUS

## 2012-12-30 MED ORDER — LORAZEPAM 2 MG/ML IJ SOLN
0.5000 mg | Freq: Once | INTRAMUSCULAR | Status: AC
  Administered 2012-12-30: 0.5 mg via INTRAVENOUS

## 2012-12-30 MED ORDER — SODIUM CHLORIDE 0.9 % IV SOLN
150.0000 mg | Freq: Once | INTRAVENOUS | Status: AC
  Administered 2012-12-30: 150 mg via INTRAVENOUS
  Filled 2012-12-30: qty 5

## 2012-12-30 MED ORDER — SODIUM CHLORIDE 0.9 % IJ SOLN
10.0000 mL | INTRAMUSCULAR | Status: DC | PRN
  Administered 2012-12-30: 10 mL
  Filled 2012-12-30: qty 10

## 2012-12-30 MED ORDER — PALONOSETRON HCL INJECTION 0.25 MG/5ML
0.2500 mg | Freq: Once | INTRAVENOUS | Status: AC
  Administered 2012-12-30: 0.25 mg via INTRAVENOUS

## 2012-12-30 MED ORDER — SODIUM CHLORIDE 0.9 % IV SOLN
Freq: Once | INTRAVENOUS | Status: AC
  Administered 2012-12-30: 11:00:00 via INTRAVENOUS

## 2012-12-30 MED ORDER — SODIUM CHLORIDE 0.9 % IV SOLN
600.0000 mg/m2 | Freq: Once | INTRAVENOUS | Status: AC
  Administered 2012-12-30: 1200 mg via INTRAVENOUS
  Filled 2012-12-30: qty 60

## 2012-12-30 MED ORDER — HEPARIN SOD (PORK) LOCK FLUSH 100 UNIT/ML IV SOLN
500.0000 [IU] | Freq: Once | INTRAVENOUS | Status: AC | PRN
  Administered 2012-12-30: 500 [IU]
  Filled 2012-12-30: qty 5

## 2012-12-30 MED ORDER — DOXORUBICIN HCL CHEMO IV INJECTION 2 MG/ML
60.0000 mg/m2 | Freq: Once | INTRAVENOUS | Status: AC
  Administered 2012-12-30: 120 mg via INTRAVENOUS
  Filled 2012-12-30: qty 60

## 2012-12-30 NOTE — Telephone Encounter (Signed)
Patient brought in disability papers with her during her office appt. Papers given to Axel Filler in managed care.

## 2012-12-30 NOTE — Patient Instructions (Addendum)
[facility] Discharge Instructions for Patients Receiving Chemotherapy  Today you received the following chemotherapy agents Adriamycin/Cytoxan To help prevent nausea and vomiting after your treatment, we encourage you to take your nausea medication as prescribed.  If you develop nausea and vomiting that is not controlled by your nausea medication, call the clinic. If it is after clinic hours your family physician or the after hours number for the clinic or go to the Emergency Department.  Do not drive as you received IV Ativan. BELOW ARE SYMPTOMS THAT SHOULD BE REPORTED IMMEDIATELY:  *FEVER GREATER THAN 100.5 F  *CHILLS WITH OR WITHOUT FEVER  NAUSEA AND VOMITING THAT IS NOT CONTROLLED WITH YOUR NAUSEA MEDICATION  *UNUSUAL SHORTNESS OF BREATH  *UNUSUAL BRUISING OR BLEEDING  TENDERNESS IN MOUTH AND THROAT WITH OR WITHOUT PRESENCE OF ULCERS  *URINARY PROBLEMS  *BOWEL PROBLEMS  UNUSUAL RASH Items with * indicate a potential emergency and should be followed up as soon as possible.  One of the nurses will contact you 24 hours after your treatment. Please let the nurse know about any problems that you may have experienced. Feel free to call the clinic you have any questions or concerns. The clinic phone number is [phone].   I have been informed and understand all the instructions given to me. I know to contact the clinic, my physician, or go to the Emergency Department if any problems should occur. I do not have any questions at this time, but understand that I may call the clinic during office hours   should I have any questions or need assistance in obtaining follow up care.    __________________________________________  _____________  __________ Signature of Patient or Authorized Representative            Date                   Time    __________________________________________ Nurse's Signature      Doxorubicin injection What is this  medicine? DOXORUBICIN (dox oh ROO bi sin) is a chemotherapy drug. It is used to treat many kinds of cancer like Hodgkin's disease, leukemia, non-Hodgkin's lymphoma, neuroblastoma, sarcoma, and Wilms' tumor. It is also used to treat bladder cancer, breast cancer, lung cancer, ovarian cancer, stomach cancer, and thyroid cancer. This medicine may be used for other purposes; ask your health care provider or pharmacist if you have questions. What should I tell my health care provider before I take this medicine? They need to know if you have any of these conditions: -blood disorders -heart disease, recent heart attack -infection (especially a virus infection such as chickenpox, cold sores, or herpes) -irregular heartbeat -liver disease -recent or ongoing radiation therapy -an unusual or allergic reaction to doxorubicin, other chemotherapy agents, other medicines, foods, dyes, or preservatives -pregnant or trying to get pregnant -breast-feeding How should I use this medicine? This drug is given as an infusion into a vein. It is administered in a hospital or clinic by a specially trained health care professional. If you have pain, swelling, burning or any unusual feeling around the site of your injection, tell your health care professional right away. Talk to your pediatrician regarding the use of this medicine in children. Special care may be needed. Overdosage: If you think you have taken too much of this medicine contact a poison control center or emergency room at once. NOTE: This medicine is only for you. Do not share this medicine with others. What if I miss a dose? It  is important not to miss your dose. Call your doctor or health care professional if you are unable to keep an appointment. What may interact with this medicine? Do not take this medicine with any of the following medications: -cisapride -droperidol -halofantrine -pimozide -zidovudine This medicine may also interact with the  following medications: -chloroquine -chlorpromazine -clarithromycin -cyclophosphamide -cyclosporine -erythromycin -medicines for depression, anxiety, or psychotic disturbances -medicines for irregular heart beat like amiodarone, bepridil, dofetilide, encainide, flecainide, propafenone, quinidine -medicines for seizures like ethotoin, fosphenytoin, phenytoin -medicines for nausea, vomiting like dolasetron, ondansetron, palonosetron -medicines to increase blood counts like filgrastim, pegfilgrastim, sargramostim -methadone -methotrexate -pentamidine -progesterone -vaccines -verapamil Talk to your doctor or health care professional before taking any of these medicines: -acetaminophen -aspirin -ibuprofen -ketoprofen -naproxen This list may not describe all possible interactions. Give your health care provider a list of all the medicines, herbs, non-prescription drugs, or dietary supplements you use. Also tell them if you smoke, drink alcohol, or use illegal drugs. Some items may interact with your medicine. What should I watch for while using this medicine? Your condition will be monitored carefully while you are receiving this medicine. You will need important blood work done while you are taking this medicine. This drug may make you feel generally unwell. This is not uncommon, as chemotherapy can affect healthy cells as well as cancer cells. Report any side effects. Continue your course of treatment even though you feel ill unless your doctor tells you to stop. Your urine may turn red for a few days after your dose. This is not blood. If your urine is dark or brown, call your doctor. In some cases, you may be given additional medicines to help with side effects. Follow all directions for their use. Call your doctor or health care professional for advice if you get a fever, chills or sore throat, or other symptoms of a cold or flu. Do not treat yourself. This drug decreases your body's  ability to fight infections. Try to avoid being around people who are sick. This medicine may increase your risk to bruise or bleed. Call your doctor or health care professional if you notice any unusual bleeding. Be careful brushing and flossing your teeth or using a toothpick because you may get an infection or bleed more easily. If you have any dental work done, tell your dentist you are receiving this medicine. Avoid taking products that contain aspirin, acetaminophen, ibuprofen, naproxen, or ketoprofen unless instructed by your doctor. These medicines may hide a fever. Men and women of childbearing age should use effective birth control methods while using taking this medicine. Do not become pregnant while taking this medicine. There is a potential for serious side effects to an unborn child. Talk to your health care professional or pharmacist for more information. Do not breast-feed an infant while taking this medicine. Do not let others touch your urine or other body fluids for 5 days after each treatment with this medicine. Caregivers should wear latex gloves to avoid touching body fluids during this time. What side effects may I notice from receiving this medicine? Side effects that you should report to your doctor or health care professional as soon as possible: -allergic reactions like skin rash, itching or hives, swelling of the face, lips, or tongue -low blood counts - this medicine may decrease the number of white blood cells, red blood cells and platelets. You may be at increased risk for infections and bleeding. -signs of infection - fever or chills, cough, sore throat,  pain or difficulty passing urine -signs of decreased platelets or bleeding - bruising, pinpoint red spots on the skin, black, tarry stools, blood in the urine -signs of decreased red blood cells - unusually weak or tired, fainting spells, lightheadedness -breathing problems -chest pain -fast, irregular heartbeat -mouth  sores -nausea, vomiting -pain, swelling, redness at site where injected -pain, tingling, numbness in the hands or feet -swelling of ankles, feet, or hands -unusual bleeding or bruising Side effects that usually do not require medical attention (report to your doctor or health care professional if they continue or are bothersome): -diarrhea -facial flushing -hair loss -loss of appetite -missed menstrual periods -nail discoloration or damage -red or watery eyes -red colored urine -stomach upset This list may not describe all possible side effects. Call your doctor for medical advice about side effects. You may report side effects to FDA at 1-800-FDA-1088. Where should I keep my medicine? This drug is given in a hospital or clinic and will not be stored at home. NOTE: This sheet is a summary. It may not cover all possible information. If you have questions about this medicine, talk to your doctor, pharmacist, or health care provider.  2013, [employer]. (12/01/2007 5:07:32 PM)   Cyclophosphamide injection What is this medicine? CYCLOPHOSPHAMIDE (sye kloe FOSS fa mide) is a chemotherapy drug. It slows the growth of cancer cells. This medicine is used to treat many types of cancer like lymphoma, myeloma, leukemia, breast cancer, and ovarian cancer, to name a few. It is also used to treat nephrotic syndrome in children. This medicine may be used for other purposes; ask your health care provider or pharmacist if you have questions. What should I tell my health care provider before I take this medicine? They need to know if you have any of these conditions: -blood disorders -history of other chemotherapy -history of radiation therapy -infection -kidney disease -liver disease -tumors in the bone marrow -an unusual or allergic reaction to cyclophosphamide, other chemotherapy, other medicines, foods, dyes, or preservatives -pregnant or trying to get pregnant -breast-feeding How  should I use this medicine? This drug is usually given as an injection into a vein or muscle or by infusion into a vein. It is administered in a hospital or clinic by a specially trained health care professional. Talk to your pediatrician regarding the use of this medicine in children. While this drug may be prescribed for selected conditions, precautions do apply. Overdosage: If you think you have taken too much of this medicine contact a poison control center or emergency room at once. NOTE: This medicine is only for you. Do not share this medicine with others. What if I miss a dose? It is important not to miss your dose. Call your doctor or health care professional if you are unable to keep an appointment. What may interact with this medicine? Do not take this medicine with any of the following medications: -mibefradil -nalidixic acid This medicine may also interact with the following medications: -doxorubicin -etanercept -medicines to increase blood counts like filgrastim, pegfilgrastim, sargramostim -medicines that block muscle or nerve pain -St. John's Wort -phenobarbital -succinylcholine chloride -trastuzumab -vaccines Talk to your doctor or health care professional before taking any of these medicines: -acetaminophen -aspirin -ibuprofen -ketoprofen -naproxen This list may not describe all possible interactions. Give your health care provider a list of all the medicines, herbs, non-prescription drugs, or dietary supplements you use. Also tell them if you smoke, drink alcohol, or use illegal drugs. Some items may interact with  your medicine. What should I watch for while using this medicine? Visit your doctor for checks on your progress. This drug may make you feel generally unwell. This is not uncommon, as chemotherapy can affect healthy cells as well as cancer cells. Report any side effects. Continue your course of treatment even though you feel ill unless your doctor tells you  to stop. Drink water or other fluids as directed. Urinate often, even at night. In some cases, you may be given additional medicines to help with side effects. Follow all directions for their use. Call your doctor or health care professional for advice if you get a fever, chills or sore throat, or other symptoms of a cold or flu. Do not treat yourself. This drug decreases your body's ability to fight infections. Try to avoid being around people who are sick. This medicine may increase your risk to bruise or bleed. Call your doctor or health care professional if you notice any unusual bleeding. Be careful brushing and flossing your teeth or using a toothpick because you may get an infection or bleed more easily. If you have any dental work done, tell your dentist you are receiving this medicine. Avoid taking products that contain aspirin, acetaminophen, ibuprofen, naproxen, or ketoprofen unless instructed by your doctor. These medicines may hide a fever. Do not become pregnant while taking this medicine. Women should inform their doctor if they wish to become pregnant or think they might be pregnant. There is a potential for serious side effects to an unborn child. Talk to your health care professional or pharmacist for more information. Do not breast-feed an infant while taking this medicine. Men should inform their doctor if they wish to father a child. This medicine may lower sperm counts. If you are going to have surgery, tell your doctor or health care professional that you have taken this medicine. What side effects may I notice from receiving this medicine? Side effects that you should report to your doctor or health care professional as soon as possible: -allergic reactions like skin rash, itching or hives, swelling of the face, lips, or tongue -low blood counts - this medicine may decrease the number of white blood cells, red blood cells and platelets. You may be at increased risk for infections  and bleeding. -signs of infection - fever or chills, cough, sore throat, pain or difficulty passing urine -signs of decreased platelets or bleeding - bruising, pinpoint red spots on the skin, black, tarry stools, blood in the urine -signs of decreased red blood cells - unusually weak or tired, fainting spells, lightheadedness -breathing problems -dark urine -mouth sores -pain, swelling, redness at site where injected -swelling of the ankles, feet, hands -trouble passing urine or change in the amount of urine -weight gain -yellowing of the eyes or skin Side effects that usually do not require medical attention (report to your doctor or health care professional if they continue or are bothersome): -changes in nail or skin color -diarrhea -hair loss -loss of appetite -missed menstrual periods -nausea, vomiting -stomach pain This list may not describe all possible side effects. Call your doctor for medical advice about side effects. You may report side effects to FDA at 1-800-FDA-1088. Where should I keep my medicine? This drug is given in a hospital or clinic and will not be stored at home. NOTE: This sheet is a summary. It may not cover all possible information. If you have questions about this medicine, talk to your doctor, pharmacist, or health care provider.  2013, [employer]. (11/17/2007 2:32:25 PM)

## 2012-12-30 NOTE — Progress Notes (Signed)
Patient's daughter will drive her today as she received Ativan IV

## 2012-12-30 NOTE — Progress Notes (Signed)
OFFICE PROGRESS NOTE  CC  [name], MD [address] [place] [address] [place] [address] Dr. Claud Kelp  Dr. Antony Blackbird  DIAGNOSIS: 52 year old female with new diagnosis of bilateral breast cancers.   STAGE:  Cancer of upper-outer quadrant of female breast  Primary site: Breast (Bilateral)  Staging method: AJCC 7th Edition  Clinical: Stage IA (T1c, N0, cM0)  Summary: Stage IA (T1c, N0, cM0)   PRIOR THERAPY: #1Patient recently underwent screening mammograms and she was found to have bilateral breast abnormalities.diagnostic mammogram showed in the right breast an ill-defined 2 cm mass with a few associated well defined microcalcifications over the upper outer quadrant. Spot compression images of the left breast demonstrated an ill defined spiculated 8 mm mass centrally. Ultrasound showed irregular bordered heterogeneous hypoechoic solid mass at the 10:00 position of the right breast 6 cm from the nipple measuring 1.1 x 1.3 x 1.4 cm. Also in this location was an adjacent smaller irregular hypoechoic mass measuring 3 x 5 x 6 mm. The smaller mass was 1 cm from the large more irregular mass. Ultrasound of the axilla demonstrated single lymph node with thickened cortex. Ultrasound of the left breast demonstrated an ill-defined hypoechoic mass with distal acoustic shadowing 7 to 8:00 position 3 cm from the nipple located deep and measuring 5 x 8 x 8 mm ultrasound of the left axilla showed normal appearing lymph nodes.   #2Patient went on to have needle core biopsies performed of both of the masses. 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 The tumor was ER positive PR positive with a Ki-67 of 9% the second tumor was ER negative PR negative HER-2/neu negative with Ki-6792% and elevated.  #3 Patient had MRIs of the breasts performed. There was  noted to be 1.7 cm irregular enhancing mass located within the upper-outer quadrant of the right breast with 3 adjacent worrisome enhancing satellite nodules in aggregate the nodules and mass measures 3.8 cm. In the left breast 8 mm irregular enhancing mass located within the central left breast was noted. No evidence of axillary or internal mammary adenopathy.  #4patient is status post bilateral lumpectomies. The right breast revealed a 1.5 cm grade 3 invasive ductal carcinoma ER/PR negative HER-2/neu negative Ki-67 92%. Sentinel node was negative. Left breast showed 1.0 cm grade 3 invasive ductal carcinoma ER/PR positive HER-2/neu negative sentinel node was negative.  #5 patient is to begin adjuvant chemotherapy consisting of Adriamycin Cytoxan every 2 weeks for a total of 4 cycles. This would then be followed by Taxol and carboplatinum weekly for 12 weeks. Patient will need adjuvant radiation therapy. Because one of the tumor is ER positive she will also receive antiestrogen therapy.  CURRENT THERAPY:cycle 1 day 1 of adjuvant dose dense AC  INTERVAL HISTORY: Amy Leonard 52 y.o. female returns forfollowup visit prior to chemotherapy. Overall she is well however she is very anxious and nervous. No nausea or vomiting or diarrhea. No fevers or chills. She has noticed a fullness in the left axilla. Remainder of the 10 point of review of systems is negative.  MEDICAL HISTORY: Past Medical History  Diagnosis Date  . Elevated WBC count     nl diff  . Chronic back pain     spinal stenosis  . Tobacco abuse   . Anxiety and depression   . HSV infection  recurrent (side and buttox)  . Migraine   . Glaucoma(365)     ?  Marland Kitchen Carpal tunnel syndrome   . HLD (hyperlipidemia)   . Folliculitis 05/05/2011  . Anxiety   . Depression   . 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 GERD (gastroesophageal reflux disease)   . Hypertension   . Breast cancer   . Wears dentures     top    ALLERGIES:  is  allergic to bupropion hcl; chlorhexidine gluconate; hydrocod polst-cpm polst er; lisinopril; and naproxen sodium.  MEDICATIONS:  Current Outpatient Prescriptions  Medication Sig Dispense Refill  . Acetaminophen (TYLENOL ARTHRITIS PAIN PO) Take 2 tablets by mouth as needed.      Marland Kitchen acyclovir (ZOVIRAX) 5 % ointment Apply topically as needed.       Marland Kitchen aspirin 81 MG chewable tablet Chew 81 mg by mouth daily.      Marland Kitchen atorvastatin (LIPITOR) 10 MG tablet Take 1 tablet (10 mg total) by mouth daily.  90 tablet  1  . CHANTIX 0.5 MG tablet       . dexamethasone (DECADRON) 4 MG tablet Take 2 tablets by mouth once a day on the day after chemotherapy and then take 2 tablets two times a day for 2 days. Take with food.  30 tablet  1  . lidocaine-prilocaine (EMLA) cream Apply topically as needed.  30 g  8  . LORazepam (ATIVAN) 0.5 MG tablet Take 1 tablet (0.5 mg total) by mouth every 6 (six) hours as needed (Nausea or vomiting).  30 tablet  0  . losartan (COZAAR) 50 MG tablet Take 1 tablet (50 mg total) by mouth daily.  90 tablet  2  . omeprazole (PRILOSEC) 40 MG capsule Take 1 capsule (40 mg total) by mouth 2 (two) times daily.  180 capsule  3  . ondansetron (ZOFRAN) 8 MG tablet Take 1 tablet (8 mg total) by mouth 2 (two) times daily as needed. Take two times a day as needed for nausea or vomiting starting on the third day after chemotherapy.  30 tablet  1  . traMADol (ULTRAM) 50 MG tablet Take 1 tablet (50 mg total) by mouth every 8 (eight) hours as needed.  30 tablet  0  . calcium carbonate (TUMS - DOSED IN MG ELEMENTAL CALCIUM) 500 MG chewable tablet Chew by mouth as directed.        . cyclobenzaprine (FLEXERIL) 10 MG tablet Take 10 mg by mouth as needed.       . meloxicam (MOBIC) 15 MG tablet Take 1 tablet by mouth daily.      . prochlorperazine (COMPAZINE) 10 MG tablet Take 1 tablet (10 mg total) by mouth every 6 (six) hours as needed (Nausea or vomiting).  30 tablet  1  . prochlorperazine (COMPAZINE) 25 MG  suppository Place 1 suppository (25 mg total) rectally every 12 (twelve) hours as needed for nausea.  12 suppository  3   No current facility-administered medications for this visit.   Facility-Administered Medications Ordered in Other Visits  Medication Dose Route Frequency Provider Last Rate Last Dose  . sodium chloride 0.9 % injection 10 mL  10 mL Intracatheter PRN Victorino December, MD   10 mL at 12/30/12 1301    SURGICAL HISTORY:  Past Surgical History  Procedure Laterality Date  . Btl    . Epidural steroid injection  2008  . Colonoscopy    . Upper gi endoscopy    . Partial mastectomy with needle localization and axillary  sentinel lymph node bx Bilateral 11/23/2012    Procedure: PARTIAL MASTECTOMY WITH NEEDLE LOCALIZATION AND AXILLARY SENTINEL LYMPH NODE BX;  Surgeon: Ernestene Mention, MD;  Location: [facility];  Service: General;  Laterality: Bilateral;  . Portacath placement Right 11/23/2012    Procedure: INSERTION PORT-A-CATH;  Surgeon: Ernestene Mention, MD;  Location: [facility];  Service: General;  Laterality: Right;    REVIEW OF SYSTEMS:  Pertinent items are noted in HPI.   HEALTH MAINTENANCE:  PHYSICAL EXAMINATION: Blood pressure 150/83, pulse 82, temperature 98.4 F (36.9 C), temperature source Oral, resp. rate 20, height 5\' 3"  (1.6 m), weight 193 lb 6.4 oz (87.726 kg). Body mass index is 34.27 kg/(m^2). ECOG PERFORMANCE STATUS: 0 - Asymptomatic  Well-developed nourished female in no acute distress HEENT exam: EOMI PERRLA sclerae anicteric no conjunctival pallor oral mucosa is moist neck supple no palpable lymphadenopathy lungs are clear to auscultation cardiovascular regular rate rhythm abdomen is soft nontender nondistended bowel sounds are present no HSM extremities no edema neuro patient's alert oriented otherwise nonfocal bilateral breast examination feeling surgical scars no nodularity no masses.   LABORATORY DATA: Lab Results   Component Value Date   WBC 9.1 12/30/2012   HGB 13.3 12/30/2012   HCT 40.0 12/30/2012   MCV 92.8 12/30/2012   PLT 239 12/30/2012      Chemistry      Component Value Date/Time   NA 139 12/30/2012 0939   NA 140 08/24/2012 0805   K 3.6 12/30/2012 0939   K 4.3 08/24/2012 0805   CL 107 12/30/2012 0939   CL 104 08/24/2012 0805   CO2 23 12/30/2012 0939   CO2 29 08/24/2012 0805   BUN 12.5 12/30/2012 0939   BUN 13 08/24/2012 0805   CREATININE 0.7 12/30/2012 0939   CREATININE 0.8 08/24/2012 0805      Component Value Date/Time   CALCIUM 8.6 12/30/2012 0939   CALCIUM 8.9 08/24/2012 0805   ALKPHOS 69 12/30/2012 0939   ALKPHOS 44 08/24/2012 0805   AST 13 12/30/2012 0939   AST 15 08/24/2012 0805   ALT 17 12/30/2012 0939   ALT 18 08/24/2012 0805   BILITOT 0.33 12/30/2012 0939   BILITOT 0.7 08/24/2012 0805    ADDITIONAL INFORMATION: 1. CHROMOGENIC IN-SITU HYBRIDIZATION Results: HER-2/NEU BY CISH - NO AMPLIFICATION OF HER-2 DETECTED. RESULT RATIO OF HER2: CEP 17 SIGNALS 1.00 AVERAGE HER2 COPY NUMBER PER CELL 1.55 REFERENCE RANGE NEGATIVE HER2/Chr17 Ratio <2.0 and Average HER2 copy number <4.0 EQUIVOCAL HER2/Chr17 Ratio <2.0 and Average HER2 copy number 4.0 and <6.0 POSITIVE HER2/Chr17 Ratio >=2.0 and/or Average HER2 copy number >=6.0 Pecola Leisure MD Pathologist, Electronic Signature ( Signed 12/01/2012) 3. CHROMOGENIC IN-SITU HYBRIDIZATION Results: HER-2/NEU BY CISH - NO AMPLIFICATION OF HER-2 DETECTED. RESULT RATIO OF HER2: CEP 17 SIGNALS 1.22 AVERAGE HER2 COPY NUMBER PER CELL 1.40 REFERENCE RANGE NEGATIVE HER2/Chr17 Ratio <2.0 and Average HER2 copy number <4.0 EQUIVOCAL HER2/Chr17 Ratio <2.0 and Average HER2 copy number 4.0 and <6.0 POSITIVE HER2/Chr17 Ratio >=2.0 and/or Average HER2 copy number >=6.0 1 of 5 FINAL for RAEGHAN, DEMETER ([id number]) ADDITIONAL INFORMATION:(continued) Pecola Leisure MD Pathologist, Electronic Signature ( Signed 12/01/2012) FINAL DIAGNOSIS Diagnosis 1. Breast,  lumpectomy, Right - INVASIVE DUCTAL CARCINOMA, GRADE III, SEE COMMENT. - INVASIVE CARCINOMA IS 0.9 CM FROM NEAREST MARGIN (INFERIOR). - NO LYMPHOVASCULAR INVASION IDENTIFIED. - HIGH GRADE DUCTAL CARCINOMA IN SITU - IN SITU CARCINOMA IS 1 MM FROM THE NEAREST LATERAL MARGIN - SEE TUMOR SYNOPTIC TEMPLATE BELOW.  2. Lymph nodes, regional resection, Right axilla - ELEVEN LYMPH NODES, NEGATIVE FOR TUMOR (0/11). 3. Breast, lumpectomy, Left - INVASIVE DUCTAL CARCINOMA, GRADE I, SEE COMMENT. - LYMPHOVASCULAR INVASION IDENTIFIED - INVASIVE CARCINOMA IS BROADLY PRESENT AT POSTERIOR MARGIN - DUCTAL CARCINOMA IN SITU, GRADE I. - IN SITU CARCINOMA IS FOCALLY PRESENT AT POSTERIOR MARGIN - SEE TUMOR SYNOPTIC TEMPLATE BELOW. 4. Breast, excision, Left - BENIGN BREAST TISSUE, SEE COMMENT. - NEGATIVE FOR ATYPIA OR MALIGNANCY. - SURGICAL MARGIN, NEGATIVE FOR ATYPIA OR MALIGNANCY. 5. Lymph node, sentinel, biopsy, Left axillary - ONE LYMPH NODE, POSITIVE FOR METASTATIC MAMMARY CARCINOMA (1/1). Microscopic Comment 1. BREAST, INVASIVE TUMOR, WITH LYMPH NODE SAMPLING Specimen, including laterality: Right breast Procedure: Lumpectomy Grade: III of III Tubule formation: 3 Nuclear pleomorphism: 3 Mitotic: 3 Tumor size (gross measurement: 1.5 cm Margins: Invasive, distance to closest margin: 0.9 cm In-situ, distance to closest margin: 0.1 cm If margin positive, focally or broadly: N/A Lymphovascular invasion: Absent Ductal carcinoma in situ: Present Grade: III of III Extensive intraductal component: Absent Lobular neoplasia: Absent Tumor focality: Multifocal Treatment effect: None 2 of 5 FINAL for MELESSIA, KAUS ([id number]) Microscopic Comment(continued) If present, treatment effect in breast tissue, lymph nodes or both: N/A Extent of tumor: Skin: N/A Nipple: N/A Skeletal muscle: N/A Lymph nodes: # examined: 11 (Part 2) Lymph nodes with metastasis: 0 Breast prognostic profile: Estrogen  receptor: Repeated, previous study demonstrated 0% positivity ([id number]) Progesterone receptor: Repeated, previous study demonstrated 0% positivity ([id number]) Her 2 neu: Repeated, previous study demonstrated no amplification (1.38) ([id number]) Ki-67: Not repeated, previous study demonstrated 92% proliferation rate ([id number]) Non-neoplastic breast: Previous biopsy site, fibrocystic change, sclerosing adenosis and benign calcifications in benign ducts and lobules. TNM: pT1c, pN0, pMX Comments: There are a total of four distinct areas of abnormality identified. The first 1.5 cm corresponds to the invasive ductal carcinoma. The second 0.9 cm area consists of high grade ductal carcinoma in situ with associated fibrocystic change and fibroadenomatoid nodules. In this focus, the in situ carcinoma is 1 mm from the nearest lateral margin. The third 0.4 cm area consists of fat necrosis and fibrosis. There are no atypical or malignant findings in this area. The fourth and final 0.3 cm area demonstrate non-neoplastic findings to include fibrocystic change. There are no atypical or malignant epithelial findings in this area either. 3. BREAST, INVASIVE TUMOR, WITH LYMPH NODE SAMPLING Specimen, including laterality: Left breast Procedure: Lumpectomy Grade: I of III Tubule formation: 3 Nuclear pleomorphism: 3 Mitotic: 1 Tumor size (gross measurement): 1.0 cm Margins: Invasive, distance to closest margin: Present at posterior In-situ, distance to closest margin: Present at posterior If margin positive, focally or broadly: Broadly (invasive) and focally (in situ) Lymphovascular invasion: Present Ductal carcinoma in situ: Present Grade: I of III Extensive intraductal component: Absent Lobular neoplasia: Absent Tumor focality: Unifocal Treatment effect: None If present, treatment effect in breast tissue, lymph nodes or both: N/A Extent of tumor: Skin: N/A Nipple: N/A Skeletal muscle:  N/A Lymph nodes: # examined: 1 (Part 5) Lymph nodes with metastasis: 1 Isolated tumor cells (< 0.2 mm): 0 Micrometastasis: (> 0.2 mm and < 2.0 mm): 1 Macrometastasis: (> 2.0 mm): 0 3 of 5 FINAL for CHARITY, TESSIER ([id number]) Microscopic Comment(continued) Extracapsular extension: Present Breast prognostic profile: Estrogen receptor: Not repeated, previous study demonstrated 100% positivity ([id number]). Progesterone receptor: Not repeated, previous study demonstrated 25% positivity ([id number]) Her 2 neu: Repeated, previous study demonstrated no amplification (1.42) ([id number]) Ki-67: Not repeated, previous study demonstrated 9% proliferation rate ([id number])  Non-neoplastic breast: Previous biopsy site, fibrocystic change and microcalcifications in benign ducts and lobules. TNM: pT1b, pN68mi, PMX Comments: The additional 1.0 cm and 1.1 cm hemorrhagic areas grossly identified consist of benign breast tissue with microcalcifications and parenchymal hemorrhage. There are no atypical or malignant findings in these areas. 4. The surgical resection margin(s) of the specimen were inked and microscopically evaluated. There is no mass grossly identified. Representative sections demonstrate predominantly benign fibrovascular and adipose tissue with focal areas of fibrocytic change and sclerosing adenosis. There are no atypical or malignant epithelial or stromal findings identified. 5. There are microscopic foci of intranodal metastatic mammary tumor deposits spanning up to 1 mm. There is focal extracapsular extension identified. (CR:kh 11-25-12) [place] RUND DO Pathologist, Electronic Signature (Case signed 11/25/2012) Specimen Gross and Clinical Information   RADIOGRAPHIC STUDIES:  Chest 2 View  11/19/2012  *RADIOLOGY REPORT*  Clinical Data: Preop for breast cancer  CHEST - 2 VIEW  Comparison: None.  Findings: Cardiomediastinal silhouette is unremarkable.  No acute infiltrate or pleural  effusion.  No pulmonary edema.  Bony thorax is unremarkable.  IMPRESSION: No active disease.   Original Report Authenticated By: Natasha Mead, M.D.    Nm Sentinel Node Inj-no Rpt (breast)  11/23/2012  CLINICAL DATA: Bilateral breast cancer   Sulfur colloid was injected intradermally by the nuclear medicine  technologist for breast cancer sentinel node localization.     Dg Chest Portable 1 View  11/23/2012  *RADIOLOGY REPORT*  Clinical Data: 52 year old female status post Port-A-Cath placement.  Breast cancer.  PORTABLE CHEST - 1 VIEW  Comparison: 11/19/2012.  Findings: Portable semi upright AP view 1441 hours.  Right chest subclavian approach Port-A-Cath placed.  Acute angulation of the catheter tubing at the level of the right axilla.  Tip at the level of the cavoatrial junction.  Interval postoperative changes to the right chest wall with subcutaneous appearing drain in place.  No pneumothorax.  No pleural effusion or pulmonary edema.  Mildly increased bibasilar opacity compatible with atelectasis.  Cardiac size and mediastinal contours are within normal limits.  Visualized tracheal air column is within normal limits.  IMPRESSION: 1.  Right subclavian approach chest Port-A-Cath in place.  Tip at the level of the cavoatrial junction.  Mildly angulated catheter tubing at the level of the axilla. 2.  Interval postoperative changes to the right chest wall. No pneumothorax. 3.  Mild dependent atelectasis.   Original Report Authenticated By: Erskine Speed, M.D.    Dg Fluoro Guide Cv Line-no Report  11/23/2012  CLINICAL DATA: bil breast cancer   FLOURO GUIDE CV LINE  Fluoroscopy was utilized by the requesting physician.  No radiographic  interpretation.     Mm Lt Plc Breast Loc Dev   1st Lesion  Inc Mammo Guide  11/23/2012  *RADIOLOGY REPORT*  Clinical Data:  Left breast cancer  NEEDLE LOCALIZATION WITH MAMMOGRAPHIC GUIDANCE AND SPECIMEN RADIOGRAPH  Comparison:  Previous exams.  Patient presents for needle  localization prior to surgery.  I met with the patient and we discussed the procedure of needle localization including benefits and alternatives. We discussed the high likelihood of a successful procedure. We discussed the risks of the procedure, including infection, bleeding, tissue injury, and further surgery. Informed, written consent was given.  Using mammographic guidance, sterile technique, 2% lidocaine and a #9 modified Kopans needle, the mass and clip in the central aspect of the left breast was localized using a superior approach.  The films are marked for Dr. Derrell Lolling.  Specimen  radiograph was performed at [facility], and confirms the mass and clip were present in the tissue sample. The clip was located at the margin of the surgical specimen.  Dr.  Derrell Lolling states she had taken more tissue at the margin to clear the border.  The specimen is marked for pathology.  IMPRESSION: Needle localization of the left breast.  No apparent complications.   Original Report Authenticated By: Baird Lyons, M.D.    Mm Rt Plc Breast Loc Dev   1st Lesion  Inc Mammo Guide  11/23/2012  *RADIOLOGY REPORT*  Clinical Data:  Known right breast cancer  NEEDLE LOCALIZATION WITH MAMMOGRAPHIC GUIDANCE AND SPECIMEN RADIOGRAPH  Comparison:  Previous exams.  Patient presents for needle localization prior to surgery.  I met with the patient and we discussed the procedure of needle localization including benefits and alternatives. We discussed the high likelihood of a successful procedure. We discussed the risks of the procedure, including infection, bleeding, tissue injury, and further surgery. Informed, written consent was given.  Using mammographic guidance, sterile technique, 2% lidocaine and a #7 and #9 modified Kopans needles, the mass and nodularity in the upper outer quadrant of the right breast was bracketedusing a lateral approach.  The films are marked for Dr. Derrell Lolling.  Specimen radiograph was performed at Day Surgery, and confirms  the mass and clip are present in the tissue sample.  The specimen is marked for pathology.  IMPRESSION: Needle localization of the right breast.  No apparent complications.   Original Report Authenticated By: Baird Lyons, M.D.     ASSESSMENT: 52 year old female with  #1 bilateral breast cancers triple negative on the right side ER positive on the left side. She is status post bilateral lumpectomies. Clinically she's doing well today. We discussed adjuvant chemotherapy today. We discussed Adriamycin Cytoxan given dose dense x4 cycles followed by Taxol carbo x12 cycles. We discussed rationale risks benefits and side effects.  #2 patient has had an echocardiogram Port-A-Cath placed and chemotherapy teaching class.   PLAN:   #1 proceed with AC today, return on 5/8 for neulasta injection.  2. We discussed use of her anitemetics.  3. She will see [place] back in 1 week for interim labs and office visit   All questions were answered. The patient knows to call the clinic with any problems, questions or concerns. We can certainly see the patient much sooner if necessary.  I spent 25 minutes counseling the patient face to face. The total time spent in the appointment was 30 minutes.    Drue Second, MD Medical/Oncology [facility] [phone] (beeper) [phone] (Office)  12/30/2012, 1:10 PM

## 2012-12-30 NOTE — Patient Instructions (Addendum)
Proceed with cycle 1 of chemotherapy  Return on 5/8 for neulasta injection  We will see you back in 1 week time for follow up

## 2012-12-31 ENCOUNTER — Encounter: Payer: Self-pay | Admitting: Oncology

## 2012-12-31 ENCOUNTER — Ambulatory Visit (HOSPITAL_BASED_OUTPATIENT_CLINIC_OR_DEPARTMENT_OTHER): Payer: BC Managed Care – PPO

## 2012-12-31 ENCOUNTER — Telehealth: Payer: Self-pay | Admitting: *Deleted

## 2012-12-31 VITALS — BP 148/75 | HR 106 | Temp 98.3°F

## 2012-12-31 DIAGNOSIS — C50411 Malignant neoplasm of upper-outer quadrant of right female breast: Secondary | ICD-10-CM

## 2012-12-31 DIAGNOSIS — C50919 Malignant neoplasm of unspecified site of unspecified female breast: Secondary | ICD-10-CM

## 2012-12-31 DIAGNOSIS — Z5189 Encounter for other specified aftercare: Secondary | ICD-10-CM

## 2012-12-31 DIAGNOSIS — C50419 Malignant neoplasm of upper-outer quadrant of unspecified female breast: Secondary | ICD-10-CM

## 2012-12-31 MED ORDER — PEGFILGRASTIM INJECTION 6 MG/0.6ML
6.0000 mg | Freq: Once | SUBCUTANEOUS | Status: AC
  Administered 2012-12-31: 6 mg via SUBCUTANEOUS
  Filled 2012-12-31: qty 0.6

## 2012-12-31 NOTE — Progress Notes (Signed)
Faxed disability form to [employer] @ [phone] °

## 2012-12-31 NOTE — Telephone Encounter (Signed)
Patient here for Neulasta injection following 1st AC chemotherapy.  States that she is doing well.  No nausea, vomiting, or diarrhea.  Is drinking lots of fluids and eating small meals without difficulty.  All questions answered.  Knows to call the office if she has any problems or questions.

## 2012-12-31 NOTE — Patient Instructions (Addendum)

## 2013-01-05 ENCOUNTER — Inpatient Hospital Stay (HOSPITAL_COMMUNITY)
Admission: EM | Admit: 2013-01-05 | Discharge: 2013-01-08 | DRG: 398 | Disposition: A | Discharge status: Home / Self Care | Payer: BC Managed Care – PPO | Attending: Internal Medicine | Admitting: Internal Medicine

## 2013-01-05 ENCOUNTER — Encounter (HOSPITAL_COMMUNITY): Payer: Self-pay | Admitting: Emergency Medicine

## 2013-01-05 DIAGNOSIS — F341 Dysthymic disorder: Secondary | ICD-10-CM

## 2013-01-05 DIAGNOSIS — F3289 Other specified depressive episodes: Secondary | ICD-10-CM | POA: Diagnosis present

## 2013-01-05 DIAGNOSIS — K1230 Oral mucositis (ulcerative), unspecified: Secondary | ICD-10-CM | POA: Diagnosis present

## 2013-01-05 DIAGNOSIS — I10 Essential (primary) hypertension: Secondary | ICD-10-CM

## 2013-01-05 DIAGNOSIS — F411 Generalized anxiety disorder: Secondary | ICD-10-CM | POA: Diagnosis present

## 2013-01-05 DIAGNOSIS — M549 Dorsalgia, unspecified: Secondary | ICD-10-CM

## 2013-01-05 DIAGNOSIS — Z170 Estrogen receptor positive status [ER+]: Secondary | ICD-10-CM

## 2013-01-05 DIAGNOSIS — C50919 Malignant neoplasm of unspecified site of unspecified female breast: Secondary | ICD-10-CM

## 2013-01-05 DIAGNOSIS — Z9010 Acquired absence of unspecified breast and nipple: Secondary | ICD-10-CM

## 2013-01-05 DIAGNOSIS — D709 Neutropenia, unspecified: Principal | ICD-10-CM | POA: Diagnosis present

## 2013-01-05 DIAGNOSIS — J410 Simple chronic bronchitis: Secondary | ICD-10-CM | POA: Diagnosis present

## 2013-01-05 DIAGNOSIS — C50411 Malignant neoplasm of upper-outer quadrant of right female breast: Secondary | ICD-10-CM

## 2013-01-05 DIAGNOSIS — C50112 Malignant neoplasm of central portion of left female breast: Secondary | ICD-10-CM

## 2013-01-05 DIAGNOSIS — Z862 Personal history of diseases of the blood and blood-forming organs and certain disorders involving the immune mechanism: Secondary | ICD-10-CM | POA: Diagnosis present

## 2013-01-05 DIAGNOSIS — E785 Hyperlipidemia, unspecified: Secondary | ICD-10-CM | POA: Diagnosis present

## 2013-01-05 DIAGNOSIS — D638 Anemia in other chronic diseases classified elsewhere: Secondary | ICD-10-CM | POA: Diagnosis present

## 2013-01-05 DIAGNOSIS — K219 Gastro-esophageal reflux disease without esophagitis: Secondary | ICD-10-CM | POA: Diagnosis present

## 2013-01-05 DIAGNOSIS — C50119 Malignant neoplasm of central portion of unspecified female breast: Secondary | ICD-10-CM | POA: Diagnosis present

## 2013-01-05 DIAGNOSIS — Z79899 Other long term (current) drug therapy: Secondary | ICD-10-CM

## 2013-01-05 DIAGNOSIS — F17200 Nicotine dependence, unspecified, uncomplicated: Secondary | ICD-10-CM | POA: Diagnosis present

## 2013-01-05 DIAGNOSIS — I6381 Other cerebral infarction due to occlusion or stenosis of small artery: Secondary | ICD-10-CM

## 2013-01-05 DIAGNOSIS — K121 Other forms of stomatitis: Secondary | ICD-10-CM | POA: Diagnosis present

## 2013-01-05 DIAGNOSIS — F329 Major depressive disorder, single episode, unspecified: Secondary | ICD-10-CM | POA: Diagnosis present

## 2013-01-05 DIAGNOSIS — Z9221 Personal history of antineoplastic chemotherapy: Secondary | ICD-10-CM

## 2013-01-05 DIAGNOSIS — E7800 Pure hypercholesterolemia, unspecified: Secondary | ICD-10-CM | POA: Diagnosis present

## 2013-01-05 DIAGNOSIS — R5081 Fever presenting with conditions classified elsewhere: Secondary | ICD-10-CM | POA: Diagnosis present

## 2013-01-05 DIAGNOSIS — D696 Thrombocytopenia, unspecified: Secondary | ICD-10-CM

## 2013-01-05 DIAGNOSIS — C50419 Malignant neoplasm of upper-outer quadrant of unspecified female breast: Secondary | ICD-10-CM | POA: Diagnosis present

## 2013-01-05 DIAGNOSIS — R509 Fever, unspecified: Secondary | ICD-10-CM

## 2013-01-05 DIAGNOSIS — I639 Cerebral infarction, unspecified: Secondary | ICD-10-CM

## 2013-01-05 LAB — URINALYSIS, ROUTINE W REFLEX MICROSCOPIC
Ketones, ur: NEGATIVE mg/dL
Leukocytes, UA: NEGATIVE
Nitrite: NEGATIVE
Protein, ur: NEGATIVE mg/dL

## 2013-01-05 LAB — COMPREHENSIVE METABOLIC PANEL
Alkaline Phosphatase: 67 U/L (ref 39–117)
BUN: 15 mg/dL (ref 6–23)
Chloride: 101 mEq/L (ref 96–112)
Creatinine, Ser: 0.54 mg/dL (ref 0.50–1.10)
GFR calc Af Amer: >90 mL/min (ref >90)
Glucose, Bld: 116 mg/dL — ABNORMAL HIGH (ref 70–99)
Potassium: 4.2 mEq/L (ref 3.5–5.1)
Total Bilirubin: 0.5 mg/dL (ref 0.3–1.2)
Total Protein: 5.9 g/dL — ABNORMAL LOW (ref 6.0–8.3)

## 2013-01-05 NOTE — ED Notes (Signed)
Pt states she has been feeling feverish all week so tonight she checked it and it was 100.3 and 100.9  Pt states she did not take any medication for the fever  Upon arrival temp is 99.2  Pt states she has been having the "shakes" and when she eats something she has to use the bathroom but it is not diarrhea  Pt states she is having a little abd cramping today as well

## 2013-01-06 ENCOUNTER — Ambulatory Visit: Payer: BC Managed Care – PPO | Admitting: Adult Health

## 2013-01-06 ENCOUNTER — Other Ambulatory Visit: Payer: BC Managed Care – PPO | Admitting: Lab

## 2013-01-06 DIAGNOSIS — C50119 Malignant neoplasm of central portion of unspecified female breast: Secondary | ICD-10-CM

## 2013-01-06 DIAGNOSIS — R5081 Fever presenting with conditions classified elsewhere: Secondary | ICD-10-CM

## 2013-01-06 DIAGNOSIS — Z862 Personal history of diseases of the blood and blood-forming organs and certain disorders involving the immune mechanism: Secondary | ICD-10-CM | POA: Diagnosis present

## 2013-01-06 DIAGNOSIS — R509 Fever, unspecified: Secondary | ICD-10-CM

## 2013-01-06 DIAGNOSIS — C50419 Malignant neoplasm of upper-outer quadrant of unspecified female breast: Secondary | ICD-10-CM

## 2013-01-06 DIAGNOSIS — D709 Neutropenia, unspecified: Secondary | ICD-10-CM | POA: Diagnosis present

## 2013-01-06 DIAGNOSIS — D638 Anemia in other chronic diseases classified elsewhere: Secondary | ICD-10-CM | POA: Diagnosis present

## 2013-01-06 LAB — CBC WITH DIFFERENTIAL/PLATELET
Basophils Absolute: 0 10*3/uL (ref 0.0–0.1)
Eosinophils Absolute: 0.1 10*3/uL (ref 0.0–0.7)
Lymphocytes Relative: 87 % — ABNORMAL HIGH (ref 12–46)
MCHC: 34.9 g/dL (ref 30.0–36.0)
Neutro Abs: 0 10*3/uL — ABNORMAL LOW (ref 1.7–7.7)
Neutrophils Relative %: 2 % — ABNORMAL LOW (ref 43–77)
Platelets: 93 10*3/uL — ABNORMAL LOW (ref 150–400)
RDW: 12.4 % (ref 11.5–15.5)

## 2013-01-06 LAB — CBC
MCH: 30.5 pg (ref 26.0–34.0)
Platelets: 78 10*3/uL — ABNORMAL LOW (ref 150–400)
RBC: 3.83 MIL/uL — ABNORMAL LOW (ref 3.87–5.11)

## 2013-01-06 MED ORDER — VARENICLINE TARTRATE 0.5 MG PO TABS
0.5000 mg | ORAL_TABLET | Freq: Every day | ORAL | Status: DC
  Administered 2013-01-06 – 2013-01-08 (×3): 0.5 mg via ORAL
  Filled 2013-01-06 (×5): qty 1

## 2013-01-06 MED ORDER — PROCHLORPERAZINE MALEATE 10 MG PO TABS
10.0000 mg | ORAL_TABLET | Freq: Four times a day (QID) | ORAL | Status: DC | PRN
  Filled 2013-01-06: qty 1

## 2013-01-06 MED ORDER — CYCLOBENZAPRINE HCL 10 MG PO TABS
10.0000 mg | ORAL_TABLET | ORAL | Status: DC | PRN
  Administered 2013-01-06: 10 mg via ORAL
  Filled 2013-01-06: qty 1

## 2013-01-06 MED ORDER — SIMETHICONE 80 MG PO CHEW
80.0000 mg | CHEWABLE_TABLET | Freq: Once | ORAL | Status: AC
  Administered 2013-01-06: 80 mg via ORAL
  Filled 2013-01-06 (×2): qty 1

## 2013-01-06 MED ORDER — VANCOMYCIN HCL IN DEXTROSE 1-5 GM/200ML-% IV SOLN
1000.0000 mg | Freq: Three times a day (TID) | INTRAVENOUS | Status: DC
  Administered 2013-01-06 – 2013-01-08 (×5): 1000 mg via INTRAVENOUS
  Filled 2013-01-06 (×8): qty 200

## 2013-01-06 MED ORDER — HEPARIN SODIUM (PORCINE) 5000 UNIT/ML IJ SOLN
5000.0000 [IU] | Freq: Three times a day (TID) | INTRAMUSCULAR | Status: DC
  Administered 2013-01-06: 5000 [IU] via SUBCUTANEOUS
  Filled 2013-01-06 (×4): qty 1

## 2013-01-06 MED ORDER — ATORVASTATIN CALCIUM 10 MG PO TABS
10.0000 mg | ORAL_TABLET | Freq: Every day | ORAL | Status: DC
Start: 2013-01-06 — End: 2013-01-08
  Administered 2013-01-06 – 2013-01-07 (×2): 10 mg via ORAL
  Filled 2013-01-06 (×3): qty 1

## 2013-01-06 MED ORDER — LORAZEPAM 0.5 MG PO TABS
0.5000 mg | ORAL_TABLET | Freq: Four times a day (QID) | ORAL | Status: DC | PRN
  Administered 2013-01-06 – 2013-01-07 (×2): 0.5 mg via ORAL
  Filled 2013-01-06 (×4): qty 1

## 2013-01-06 MED ORDER — SODIUM CHLORIDE 0.9 % IV SOLN
Freq: Once | INTRAVENOUS | Status: AC
  Administered 2013-01-06: 02:00:00 via INTRAVENOUS

## 2013-01-06 MED ORDER — LORAZEPAM 0.5 MG PO TABS: 0.5000 mg | ORAL_TABLET | Freq: Two times a day (BID) | ORAL | Status: DC | PRN

## 2013-01-06 MED ORDER — VANCOMYCIN HCL IN DEXTROSE 1-5 GM/200ML-% IV SOLN
1000.0000 mg | Freq: Once | INTRAVENOUS | Status: AC
  Administered 2013-01-06: 1000 mg via INTRAVENOUS
  Filled 2013-01-06: qty 200

## 2013-01-06 MED ORDER — ACETAMINOPHEN 325 MG PO TABS
650.0000 mg | ORAL_TABLET | Freq: Four times a day (QID) | ORAL | Status: DC | PRN
  Administered 2013-01-06: 650 mg via ORAL
  Filled 2013-01-06: qty 2

## 2013-01-06 MED ORDER — DEXTROSE 5 % IV SOLN
1.0000 g | Freq: Three times a day (TID) | INTRAVENOUS | Status: DC
  Administered 2013-01-06: 1 g via INTRAVENOUS
  Filled 2013-01-06 (×2): qty 1

## 2013-01-06 MED ORDER — ACETAMINOPHEN 325 MG PO TABS
650.0000 mg | ORAL_TABLET | Freq: Once | ORAL | Status: AC
  Administered 2013-01-06: 650 mg via ORAL
  Filled 2013-01-06: qty 2

## 2013-01-06 MED ORDER — SIMETHICONE 80 MG PO CHEW
80.0000 mg | CHEWABLE_TABLET | Freq: Three times a day (TID) | ORAL | Status: DC | PRN
  Administered 2013-01-08: 80 mg via ORAL
  Filled 2013-01-06: qty 1

## 2013-01-06 MED ORDER — LOSARTAN POTASSIUM 50 MG PO TABS
50.0000 mg | ORAL_TABLET | Freq: Every day | ORAL | Status: DC
  Administered 2013-01-06 – 2013-01-08 (×3): 50 mg via ORAL
  Filled 2013-01-06 (×3): qty 1

## 2013-01-06 MED ORDER — FILGRASTIM 480 MCG/1.6ML IJ SOLN: 480.0000 ug | Freq: Every day | INTRAVENOUS | Status: DC

## 2013-01-06 MED ORDER — ASPIRIN 81 MG PO CHEW
81.0000 mg | CHEWABLE_TABLET | Freq: Every day | ORAL | Status: DC
  Administered 2013-01-06 – 2013-01-08 (×3): 81 mg via ORAL
  Filled 2013-01-06 (×3): qty 1

## 2013-01-06 MED ORDER — ACETAMINOPHEN 650 MG RE SUPP: 650.0000 mg | Freq: Four times a day (QID) | RECTAL | Status: DC | PRN

## 2013-01-06 MED ORDER — DEXTROSE 5 % IV SOLN
2.0000 g | Freq: Three times a day (TID) | INTRAVENOUS | Status: DC
  Administered 2013-01-06 – 2013-01-08 (×6): 2 g via INTRAVENOUS
  Filled 2013-01-06 (×8): qty 2

## 2013-01-06 MED ORDER — MAGIC MOUTHWASH W/LIDOCAINE
15.0000 mL | Freq: Four times a day (QID) | ORAL | Status: DC | PRN
  Administered 2013-01-06 – 2013-01-07 (×3): 15 mL via ORAL
  Filled 2013-01-06 (×5): qty 15

## 2013-01-06 MED ORDER — DEXTROSE 5 % IV SOLN
1.0000 g | Freq: Once | INTRAVENOUS | Status: AC
  Administered 2013-01-06: 1 g via INTRAVENOUS
  Filled 2013-01-06: qty 1

## 2013-01-06 MED ORDER — ONDANSETRON HCL 8 MG PO TABS: 8.0000 mg | ORAL_TABLET | Freq: Two times a day (BID) | ORAL | Status: DC | PRN

## 2013-01-06 MED ORDER — DIAZEPAM 5 MG PO TABS
5.0000 mg | ORAL_TABLET | Freq: Once | ORAL | Status: AC
  Administered 2013-01-06: 5 mg via ORAL
  Filled 2013-01-06: qty 1

## 2013-01-06 MED ORDER — PANTOPRAZOLE SODIUM 40 MG PO TBEC
80.0000 mg | DELAYED_RELEASE_TABLET | Freq: Every day | ORAL | Status: DC
  Administered 2013-01-06 – 2013-01-08 (×3): 80 mg via ORAL
  Filled 2013-01-06 (×3): qty 2

## 2013-01-06 MED ORDER — DEXTROSE 5 % IV SOLN
2.0000 g | Freq: Once | INTRAVENOUS | Status: AC
  Administered 2013-01-06: 2 g via INTRAVENOUS
  Filled 2013-01-06: qty 2

## 2013-01-06 MED ORDER — FILGRASTIM 480 MCG/1.6ML IJ SOLN
480.0000 ug | Freq: Every day | INTRAMUSCULAR | Status: DC
  Administered 2013-01-06 – 2013-01-07 (×2): 480 ug via SUBCUTANEOUS
  Filled 2013-01-06 (×4): qty 1.6

## 2013-01-06 NOTE — Progress Notes (Addendum)
ANTIBIOTIC CONSULT NOTE - INITIAL  Pharmacy Consult for vancomycin Indication: febrile neutropenia  Allergies  Allergen Reactions  . Bupropion Hcl Nausea Only and Other (See Comments)     GI, sleepy  . Chlorhexidine Gluconate Itching and Rash  . Hydrocod Polst-Cpm Polst Er Nausea And Vomiting    vomiting  . Lisinopril Other (See Comments)    Leg cramps    . Naproxen Sodium Other (See Comments)    does not tolerate    Patient Measurements: Height: 5\' 3"  (160 cm) Weight: 191 lb 5.8 oz (86.8 kg) IBW/kg (Calculated) : 52.4 Adjusted Body Weight: 66.2 kg  Vital Signs: Temp: 99 F (37.2 C) (05/14 0500) Temp src: Oral (05/14 0500) BP: 104/60 mmHg (05/14 0500) Pulse Rate: 99 (05/14 0500) Intake/Output from previous day: 05/13 0701 - 05/14 0700 In: -  Out: 450 [Urine:450] Intake/Output from this shift: Total I/O In: 240 [P.O.:240] Out: 400 [Urine:400]  Labs:  Recent Labs  01/05/13 2245 01/06/13 0006 01/06/13 0630  WBC  --  1.0* 0.8*  HGB  --  12.2 11.7*  PLT  --  93* 78*  CREATININE 0.54  --   --    Estimated Creatinine Clearance: 86 ml/min (by C-G formula based on Cr of 0.54).  Medical History: Past Medical History  Diagnosis Date  . Elevated WBC count     nl diff  . Chronic back pain     spinal stenosis  . Tobacco abuse   . Anxiety and depression   . HSV infection     recurrent (side and buttox)  . Migraine   . Glaucoma(365)     ?  Marland Kitchen Carpal tunnel syndrome   . HLD (hyperlipidemia)   . Folliculitis 05/05/2011  . Anxiety   . Depression   . 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 GERD (gastroesophageal reflux disease)   . Hypertension   . Breast cancer   . Wears dentures     top    Microbiology: 5/14 BCx x2: pending  Antibiotic history this admission: 5/14 >> Fortaz MD >> 5/14 5/14 >> vanc Rx >> 5/14 >> cefepime 2g q8 >>  Assessment: 16 yoF admitted 01/06/13 with neutropenic fever to 100.9 in ED. Presents on C1D6 of dose-dence Adriamycin and  Cytoxan for bilateral breat cancer, started 12/30/12. Patient started on ceftazidime per MD in the ED, attending MD now adding vancomycin per Rx.   SCr stable at 0.54, making good urine  CrCl 86 mL/min (GC) using ABW= 66.2 kg and SCr=0.8  CrCl 110 (N)  Tm/24h 100.5  WBC 0.8 (ANC 0.0)  neupogen 480 mcg daily on order   Goal of Therapy:  Vancomycin trough level 15-20 mcg/ml eradication of infection  Plan:   Start vancomycin 1g IV q8h  Change ceftazidime to cefepime 2g IV q8h per discussion with Dr. Izola Price  Follow-up cultures, fever curve, WBC, clinical course  Vancomycin trough at steady state  Follow-up antibiotic de-escalation if possible and length of therapy  Tomi Bamberger, PharmD Clinical Pharmacist Pager: [phone] Pharmacy: [phone]  01/06/2013 10:50 AM

## 2013-01-06 NOTE — ED Notes (Signed)
Dr. Lynelle Doctor notified of pt's critical lab result.

## 2013-01-06 NOTE — H&P (Signed)
[facility] History and Physical  KAELEE PFEFFER ZOX:[id number] DOB: 11/15/1960 DOA: 01/05/2013  Referring physician: ED PCP: [name], MD  Specialists: Onc  Chief Complaint: Fever  HPI: Amy Leonard is a 52 y.o. female who presents 6 days post cycle 1 of chemotherapy for BRCA with c/o fever and mucous in loose stools.  She has taken no treatment thus far for fever.   Patient has 2 forms of BRCA both found at the same time in different breasts: one is Stage 1A triple negative, the other is a T1B N1 ER/PR positive. She called the oncologists office who recommended she come in to the ED for evaluation.  In the ED patient was found to have febrile neutropenia with fever of 100.9, and WBC of 1.0.  Blood cultures were drawn, remainder of her labs are thus far unremarkable other than mild thrombocytopenia of 93.  Oncology was consulted and the patient was started on Fortaz, hospitalist has been asked to admit for febrile neutropenia.  Review of Systems: Patient denies dysuria, no headache, does have a chronic smokers cough but no change in symptoms with regards to this.  Past Medical History  Diagnosis Date  . Elevated WBC count     nl diff  . Chronic back pain     spinal stenosis  . Tobacco abuse   . Anxiety and depression   . HSV infection     recurrent (side and buttox)  . Migraine   . Glaucoma(365)     ?  Marland Kitchen Carpal tunnel syndrome   . HLD (hyperlipidemia)   . Folliculitis 05/05/2011  . Anxiety   . Depression   . 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 GERD (gastroesophageal reflux disease)   . Hypertension   . Breast cancer   . Wears dentures     top   Past Surgical History  Procedure Laterality Date  . Btl    . Epidural steroid injection  2008  . Colonoscopy    . Upper gi endoscopy    . Partial mastectomy with needle localization and axillary sentinel lymph node bx Bilateral 11/23/2012    Procedure: PARTIAL MASTECTOMY WITH NEEDLE LOCALIZATION AND AXILLARY  SENTINEL LYMPH NODE BX;  Surgeon: Ernestene Mention, MD;  Location: [facility];  Service: General;  Laterality: Bilateral;  . Portacath placement Right 11/23/2012    Procedure: INSERTION PORT-A-CATH;  Surgeon: Ernestene Mention, MD;  Location: [facility];  Service: General;  Laterality: Right;   Social History:  reports that she has been smoking Cigarettes.  She has been smoking about 0.50 packs per day. She has never used smokeless tobacco. She reports that  drinks alcohol. She reports that she does not use illicit drugs.   Allergies  Allergen Reactions  . Bupropion Hcl Nausea Only and Other (See Comments)     GI, sleepy  . Chlorhexidine Gluconate Itching and Rash  . Hydrocod Polst-Cpm Polst Er Nausea And Vomiting    vomiting  . Lisinopril Other (See Comments)    Leg cramps    . Naproxen Sodium Other (See Comments)    does not tolerate    Family History  Problem Relation Age of Onset  . Stroke Maternal Grandmother   . Diabetes Mother   . Hypertension Mother   . Leukemia Mother   . Obesity Mother   . Depression Sister   . Bipolar disorder Son     Bipolar / oppositional defiant  . Alcohol abuse Sister   .  Drug abuse Sister   . Drug abuse Brother   . Alcohol abuse Brother   . Alcohol abuse Sister   . Drug abuse Sister   . Colon cancer Neg Hx   . Diabetes Maternal Grandmother     Prior to Admission medications   Medication Sig Start Date End Date Taking? Authorizing Provider  Acetaminophen (TYLENOL ARTHRITIS PAIN PO) Take 2 tablets by mouth as needed (pain).    Yes Historical Provider, MD  acyclovir (ZOVIRAX) 5 % ointment Apply 1 application topically as needed (flare-up).    Yes Historical Provider, MD  aspirin 81 MG chewable tablet Chew 81 mg by mouth daily.   Yes Historical Provider, MD  atorvastatin (LIPITOR) 10 MG tablet Take 1 tablet (10 mg total) by mouth daily. 09/29/12  Yes Judy Pimple, MD  calcium carbonate (TUMS - DOSED IN MG ELEMENTAL  CALCIUM) 500 MG chewable tablet Chew by mouth as directed.     Yes Historical Provider, MD  CHANTIX 0.5 MG tablet Take 0.5 mg by mouth daily.  12/14/12  Yes Historical Provider, MD  cyclobenzaprine (FLEXERIL) 10 MG tablet Take 10 mg by mouth as needed (muscle spasm).    Yes Historical Provider, MD  dexamethasone (DECADRON) 4 MG tablet Take 2 tablets by mouth once a day on the day after chemotherapy and then take 2 tablets two times a day for 2 days. Take with food. 12/04/12  Yes Victorino December, MD  lidocaine-prilocaine (EMLA) cream Apply topically as needed. 12/04/12  Yes Victorino December, MD  LORazepam (ATIVAN) 0.5 MG tablet Take 1 tablet (0.5 mg total) by mouth every 6 (six) hours as needed (Nausea or vomiting). 12/04/12  Yes Victorino December, MD  losartan (COZAAR) 50 MG tablet Take 1 tablet (50 mg total) by mouth daily. 12/09/12  Yes Judy Pimple, MD  omeprazole (PRILOSEC) 40 MG capsule Take 1 capsule (40 mg total) by mouth 2 (two) times daily. 12/07/12  Yes Meryl Dare, MD  ondansetron (ZOFRAN) 8 MG tablet Take 1 tablet (8 mg total) by mouth 2 (two) times daily as needed. Take two times a day as needed for nausea or vomiting starting on the third day after chemotherapy. 12/04/12  Yes Victorino December, MD  prochlorperazine (COMPAZINE) 10 MG tablet Take 1 tablet (10 mg total) by mouth every 6 (six) hours as needed (Nausea or vomiting). 12/04/12  Yes Victorino December, MD   Physical Exam: Filed Vitals:   01/05/13 2143 01/06/13 0017 01/06/13 0248  BP: 122/67 113/67 95/55  Pulse: 100 107 96  Temp: 99.2 F (37.3 C) 100.5 F (38.1 C) 98 F (36.7 C)  TempSrc: Oral Oral Oral  Resp: 20 18 18   Height:   5\' 3"  (1.6 m)  Weight: 87.544 kg (193 lb)  86.8 kg (191 lb 5.8 oz)  SpO2: 95% 97% 97%    General:  NAD, resting comfortably in bed Eyes: PEERLA EOMI ENT: mucous membranes moist Neck: supple w/o JVD Cardiovascular: RRR w/o MRG Respiratory: CTA B Abdomen: soft, nt, nd, bs+ Skin: no rash nor  lesion Musculoskeletal: MAE, full ROM all 4 extremities Psychiatric: normal tone and affect Neurologic: AAOx3, grossly non-focal   Labs on Admission:  Basic Metabolic Panel:  Recent Labs Lab 12/30/12 0939 01/05/13 2245  NA 139 133*  K 3.6 4.2  CL 107 101  CO2 23 25  GLUCOSE 120* 116*  BUN 12.5 15  CREATININE 0.7 0.54  CALCIUM 8.6 8.7   Liver Function Tests:  Recent Labs Lab 12/30/12 0939 01/05/13 2245  AST 13 10  ALT 17 15  ALKPHOS 69 67  BILITOT 0.33 0.5  PROT 6.2* 5.9*  ALBUMIN 3.4* 3.1*   No results found for this basename: LIPASE, AMYLASE,  in the last 168 hours No results found for this basename: AMMONIA,  in the last 168 hours CBC:  Recent Labs Lab 12/30/12 0939 01/06/13 0006  WBC 9.1 1.0*  NEUTROABS 5.2 0.0*  HGB 13.3 12.2  HCT 40.0 35.0*  MCV 92.8 91.4  PLT 239 93*   Cardiac Enzymes: No results found for this basename: CKTOTAL, CKMB, CKMBINDEX, TROPONINI,  in the last 168 hours  BNP (last 3 results) No results found for this basename: PROBNP,  in the last 8760 hours CBG: No results found for this basename: GLUCAP,  in the last 168 hours  Radiological Exams on Admission: No results found.  EKG: Independently reviewed.  Assessment/Plan Principal Problem:   Febrile neutropenia Active Problems:   ESSENTIAL HYPERTENSION   Cancer of upper-outer quadrant of female breast   Cancer of central portion of female breast   1. Febrile neutropenia - neutropenic precautions, patient started on Fortaz, repeat CBC in AM, oncology aware of patients admission, suspect GI translocation as source of infection (patient also experiencing GI sloughing from chemo).  No other symptoms to better localize the source at this time. 2. HTN - continue home meds 3. BRCA - as mentioned in HPI patient has 2 different BRCAs diagnosed at the same time in different breasts, one is triple negative but appears to be stage 1A, the other has spread to a single lymph node but is  ER/PR positive.  Both are status post resection and patient is s/p cycle 1 on day 6 of chemotherapy.    Code Status: Full Code (must indicate code status--if unknown or must be presumed, indicate so) Family Communication: Spoke with daughter at bedside (indicate person spoken with, if applicable, with phone number if by telephone) Disposition Plan: Admit to inpatient (indicate anticipated LOS)  Time spent: 70 min  Zachariah Pavek M. [facility] Pager [phone]  If 7PM-7AM, please contact night-coverage [email or web address] Password [email or web address] 01/06/2013, 2:52 AM

## 2013-01-06 NOTE — ED Provider Notes (Signed)
History     CSN: [id number]  Arrival date & time 01/05/13  2134   First MD Initiated Contact with Patient 01/05/13 2232      Chief Complaint  Patient presents with  . Fever    HPI Patient presents to emergency room with complaints of fever and chills. She has history of breast cancer. She is undergoing chemotherapy and had her first treatment last week.  She started having several mucoid formed stools today and began to get chilled.  No vomiting.  Mild abdominal cramping.  No dysuria.  No cough.  She took her temp and it was 100.9.  She called her oncologist and was instructed to come to the ED. Past Medical History  Diagnosis Date  . Elevated WBC count     nl diff  . Chronic back pain     spinal stenosis  . Tobacco abuse   . Anxiety and depression   . HSV infection     recurrent (side and buttox)  . Migraine   . Glaucoma(365)     ?  Marland Kitchen Carpal tunnel syndrome   . HLD (hyperlipidemia)   . Folliculitis 05/05/2011  . Anxiety   . Depression   . 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 GERD (gastroesophageal reflux disease)   . Hypertension   . Breast cancer   . Wears dentures     top    Past Surgical History  Procedure Laterality Date  . Btl    . Epidural steroid injection  2008  . Colonoscopy    . Upper gi endoscopy    . Partial mastectomy with needle localization and axillary sentinel lymph node bx Bilateral 11/23/2012    Procedure: PARTIAL MASTECTOMY WITH NEEDLE LOCALIZATION AND AXILLARY SENTINEL LYMPH NODE BX;  Surgeon: Ernestene Mention, MD;  Location: [facility];  Service: General;  Laterality: Bilateral;  . Portacath placement Right 11/23/2012    Procedure: INSERTION PORT-A-CATH;  Surgeon: Ernestene Mention, MD;  Location: [facility];  Service: General;  Laterality: Right;    Family History  Problem Relation Age of Onset  . Stroke Maternal Grandmother   . Diabetes Mother   . Hypertension Mother   . Leukemia Mother   . Obesity Mother    . Depression Sister   . Bipolar disorder Son     Bipolar / oppositional defiant  . Alcohol abuse Sister   . Drug abuse Sister   . Drug abuse Brother   . Alcohol abuse Brother   . Alcohol abuse Sister   . Drug abuse Sister   . Colon cancer Neg Hx   . Diabetes Maternal Grandmother     History  Substance Use Topics  . Smoking status: Current Every Day Smoker -- 0.50 packs/day    Types: Cigarettes  . Smokeless tobacco: Never Used     Comment: 1 1/2 ppd -form given 10-07-12  . Alcohol Use: Yes     Comment: 4-5 beers fri and sat night-does this rarely    OB History   Grav Para Term Preterm Abortions TAB SAB Ect Mult Living                  Review of Systems  All other systems reviewed and are negative.    Allergies  Bupropion hcl; Chlorhexidine gluconate; Hydrocod polst-cpm polst er; Lisinopril; and Naproxen sodium  Home Medications   Current Outpatient Rx  Name  Route  Sig  Dispense  Refill  . omeprazole (PRILOSEC) 40 MG capsule  Oral   Take 1 capsule (40 mg total) by mouth 2 (two) times daily.   180 capsule   3     BP 97/66  Pulse 114  Temp(Src) 97.7 F (36.5 C) (Oral)  Resp 20  Ht 5\' 3"  (1.6 m)  Wt 191 lb 5.8 oz (86.8 kg)  BMI 33.91 kg/m2  SpO2 98%  Physical Exam  Nursing note and vitals reviewed. Constitutional: She appears well-developed and well-nourished. No distress.  HENT:  Head: Normocephalic and atraumatic.  Right Ear: External ear normal.  Left Ear: External ear normal.  Eyes: Conjunctivae are normal. Right eye exhibits no discharge. Left eye exhibits no discharge. No scleral icterus.  Neck: Neck supple. No tracheal deviation present.  Cardiovascular: Normal rate, regular rhythm and intact distal pulses.   Pulmonary/Chest: Effort normal and breath sounds normal. No stridor. No respiratory distress. She has no wheezes. She has no rales.  Abdominal: Soft. Bowel sounds are normal. She exhibits no distension. There is no tenderness. There is  no rebound and no guarding.  Musculoskeletal: She exhibits no edema and no tenderness.  Neurological: She is alert. She has normal strength. No sensory deficit. Cranial nerve deficit:  no gross defecits noted. She exhibits normal muscle tone. She displays no seizure activity. Coordination normal.  Skin: Skin is warm and dry. No rash noted.  Psychiatric: She has a normal mood and affect.    ED Course  Procedures (including critical care time)  Labs Reviewed  COMPREHENSIVE METABOLIC PANEL - Abnormal; Notable for the following:    Sodium 133 (*)    Glucose, Bld 116 (*)    Total Protein 5.9 (*)    Albumin 3.1 (*)    All other components within normal limits  URINALYSIS, ROUTINE W REFLEX MICROSCOPIC - Abnormal; Notable for the following:    APPearance CLOUDY (*)    All other components within normal limits  CBC WITH DIFFERENTIAL - Abnormal; Notable for the following:    WBC 1.0 (*)    RBC 3.83 (*)    HCT 35.0 (*)    Platelets 93 (*)    Neutrophils Relative % 2 (*)    Lymphocytes Relative 87 (*)    Monocytes Relative 2 (*)    Eosinophils Relative 9 (*)    Neutro Abs 0.0 (*)    Monocytes Absolute 0.0 (*)    All other components within normal limits  CBC - Abnormal; Notable for the following:    WBC 0.8 (*)    RBC 3.83 (*)    Hemoglobin 11.7 (*)    HCT 35.0 (*)    Platelets 78 (*)    All other components within normal limits  CBC - Abnormal; Notable for the following:    WBC 1.6 (*)    RBC 3.73 (*)    Hemoglobin 11.8 (*)    HCT 34.1 (*)    Platelets 73 (*)    All other components within normal limits  BASIC METABOLIC PANEL - Abnormal; Notable for the following:    Glucose, Bld 104 (*)    All other components within normal limits  CULTURE, BLOOD (ROUTINE X 2)  CULTURE, BLOOD (ROUTINE X 2)  CBC WITH DIFFERENTIAL   No results found.   1. Fever   2. Neutropenia   3. Febrile neutropenia   10. Breast cancer, unspecified laterality       MDM  Pt has neutopenia and  fever.  Appears non toxic and stable. Pt started on IV fortaz.  Case discussed  with oncology.  Pt will be admitted to the hospital for IV abx and further treatment.        Celene Kras, MD 01/07/13 [id number]

## 2013-01-06 NOTE — Progress Notes (Signed)
[facility] PROGRESS NOTE  Amy Leonard:[id number] DOB: 20-Oct-1960 DOA: 01/05/2013 PCP: [name], MD  Brief narrative: 52 y.o. female with past medical history of bilateral breast cancer triple negative on the right side, ER positive on the left side, status post bilateral lumpectomies, on chemotherapy (under Dr. Milta Deiters care), last chemotherapy given 12/30/2012 who presented to [facility] ED 01/05/2013 with fever. In ED, patient was found to have fever of 100.9 F with WBC count of 1.0. Platelet count was 93 and hemoglobin was WNL. Patient was started on fortaz on admission.  Assessment/Plan:  Principal Problem:   Febrile neutropenia - Related to breast cancer and sequela of chemotherapy - due to immunocompromised status, we will discontinue Fortaz and start cefepime and vancomycin - Follow up results of blood cultures - start Neupogen daily  - monitor CBC daily Active Problems:   Pure hypercholesterolemia - continue statin therapy   ANXIETY, DEPRESSION - Lorazepam when necessary   TOBACCO USE - Continue Chantix   ESSENTIAL HYPERTENSION - Continue losartan   Breast cancer - Under the care of Dr. Welton Flakes - Last chemotherapy 12/30/2012 and Neulasta support and 12/31/2012   Anemia of chronic disease - Secondary to history of breast cancer and sequela of chemotherapy - Hemoglobin 12.2 on admission and today is 11.7 - No indications for transfusion   Thrombocytopenia, unspecified - Secondary to sequela of chemotherapy - Platelet count ranges 78-93 - Use SCDs for DVT prophylaxis and discontinue heparin  Code Status: full code Family Communication: no family at the bedside Disposition Plan: home when stable  Manson Passey, MD  [facility] Pager [phone]  If 7PM-7AM, please contact night-coverage [email or web address] Password [email or web address] 01/06/2013, 12:25 PM   LOS: 1 day   Consultants:  None   Procedures:  None   Antibiotics:  Vancomycin 01/05/2013 -->  Cefepime 01/05/2013  -->  HPI/Subjective: No acute overnight events.  Objective: Filed Vitals:   01/06/13 0017 01/06/13 0248 01/06/13 0300 01/06/13 0500  BP: 113/67 95/55 98/60  104/60  Pulse: 107 96  99  Temp: 100.5 F (38.1 C) 98 F (36.7 C)  99 F (37.2 C)  TempSrc: Oral Oral  Oral  Resp: 18 18  18   Height:  5\' 3"  (1.6 m)    Weight:  86.8 kg (191 lb 5.8 oz)    SpO2: 97% 97%  100%    Intake/Output Summary (Last 24 hours) at 01/06/13 1225 Last data filed at 01/06/13 0900  Gross per 24 hour  Intake    240 ml  Output    850 ml  Net   -610 ml    Exam:   General:  Pt is alert, follows commands appropriately, not in acute distress  Cardiovascular: Regular rate and rhythm, S1/S2, no murmurs, no rubs, no gallops  Respiratory: Clear to auscultation bilaterally, no wheezing, no crackles, no rhonchi  Abdomen: Soft, non tender, non distended, bowel sounds present, no guarding  Extremities: No edema, pulses DP and PT palpable bilaterally  Neuro: Grossly nonfocal  Data Reviewed: Basic Metabolic Panel:  Recent Labs Lab 01/05/13 2245  NA 133*  K 4.2  CL 101  CO2 25  GLUCOSE 116*  BUN 15  CREATININE 0.54  CALCIUM 8.7   Liver Function Tests:  Recent Labs Lab 01/05/13 2245  AST 10  ALT 15  ALKPHOS 67  BILITOT 0.5  PROT 5.9*  ALBUMIN 3.1*   CBC:  Recent Labs Lab 01/06/13 0006 01/06/13 0630  WBC 1.0* 0.8*  NEUTROABS 0.0*  --   HGB 12.2  11.7*  HCT 35.0* 35.0*  MCV 91.4 91.4  PLT 93* 78*    Studies: No results found.  Scheduled Meds: . aspirin  81 mg Oral Daily  . atorvastatin  10 mg Oral Daily  . ceFEPime (MAXIPIME) IV  2 g Intravenous Q8H  . filgrastim (NEUPOGEN)   480 mcg Subcutaneous q1800  . losartan  50 mg Oral Daily  . pantoprazole  80 mg Oral Daily  . vancomycin  1,000 mg Intravenous Q8H  . varenicline  0.5 mg Oral Daily

## 2013-01-07 ENCOUNTER — Other Ambulatory Visit: Payer: Self-pay

## 2013-01-07 DIAGNOSIS — I10 Essential (primary) hypertension: Secondary | ICD-10-CM

## 2013-01-07 DIAGNOSIS — Z1211 Encounter for screening for malignant neoplasm of colon: Secondary | ICD-10-CM

## 2013-01-07 DIAGNOSIS — K2100 Gastro-esophageal reflux disease with esophagitis, without bleeding: Secondary | ICD-10-CM

## 2013-01-07 DIAGNOSIS — R1319 Other dysphagia: Secondary | ICD-10-CM

## 2013-01-07 DIAGNOSIS — D638 Anemia in other chronic diseases classified elsewhere: Secondary | ICD-10-CM

## 2013-01-07 DIAGNOSIS — Z5189 Encounter for other specified aftercare: Secondary | ICD-10-CM

## 2013-01-07 DIAGNOSIS — C50919 Malignant neoplasm of unspecified site of unspecified female breast: Secondary | ICD-10-CM

## 2013-01-07 DIAGNOSIS — D126 Benign neoplasm of colon, unspecified: Secondary | ICD-10-CM

## 2013-01-07 DIAGNOSIS — R1310 Dysphagia, unspecified: Secondary | ICD-10-CM

## 2013-01-07 DIAGNOSIS — R509 Fever, unspecified: Secondary | ICD-10-CM

## 2013-01-07 LAB — CBC
HCT: 34.1 % — ABNORMAL LOW (ref 36.0–46.0)
Hemoglobin: 11.8 g/dL — ABNORMAL LOW (ref 12.0–15.0)
MCV: 91.4 fL (ref 78.0–100.0)
RBC: 3.73 MIL/uL — ABNORMAL LOW (ref 3.87–5.11)
WBC: 1.6 10*3/uL — ABNORMAL LOW (ref 4.0–10.5)

## 2013-01-07 LAB — BASIC METABOLIC PANEL
BUN: 11 mg/dL (ref 6–23)
CO2: 26 mEq/L (ref 19–32)
Chloride: 101 mEq/L (ref 96–112)
Creatinine, Ser: 0.6 mg/dL (ref 0.50–1.10)

## 2013-01-07 MED ORDER — OMEPRAZOLE 40 MG PO CPDR: 40.0000 mg | DELAYED_RELEASE_CAPSULE | Freq: Two times a day (BID) | ORAL | Status: DC

## 2013-01-07 MED ORDER — FLUCONAZOLE IN SODIUM CHLORIDE 400-0.9 MG/200ML-% IV SOLN
400.0000 mg | INTRAVENOUS | Status: DC
  Administered 2013-01-07: 400 mg via INTRAVENOUS
  Filled 2013-01-07 (×2): qty 200

## 2013-01-07 MED ORDER — NYSTATIN 100000 UNIT/ML MT SUSP
5.0000 mL | Freq: Two times a day (BID) | OROMUCOSAL | Status: DC
  Administered 2013-01-07 – 2013-01-08 (×3): 500000 [IU] via ORAL
  Filled 2013-01-07 (×4): qty 5

## 2013-01-07 MED ORDER — METHOCARBAMOL 500 MG PO TABS
500.0000 mg | ORAL_TABLET | Freq: Once | ORAL | Status: AC
  Administered 2013-01-07: 500 mg via ORAL
  Filled 2013-01-07: qty 1

## 2013-01-07 NOTE — Progress Notes (Signed)
[facility] PROGRESS NOTE  Amy Leonard:[id number] DOB: [birth year] DOA: 01/05/2013 PCP: [name], MD  Brief narrative: 52 y.o. female with past medical history of bilateral breast cancer triple negative on the right side, ER positive on the left side, status post bilateral lumpectomies, on chemotherapy (under Dr. Milta Deiters care), last chemotherapy given 12/30/2012 who presented to [facility] ED 01/05/2013 with fever.  In ED, patient was found to have fever of 100.9 F with WBC count of 1.0. Platelet count was 93 and hemoglobin was WNL. Patient was started on fortaz on admission.   Assessment/Plan:  Principal Problem:  Febrile neutropenia  - Related to breast cancer and sequela of chemotherapy  - We will continue cefepime and vancomycin. - Follow up results of blood cultures - still pending - We will continue neupogen daily  - monitor CBC daily  Active Problems:  Mucositis  -  Start fluconazole 400 mg daily IV - Start nystatin suspension Pure hypercholesterolemia  - continue statin therapy  ANXIETY, DEPRESSION  - Lorazepam when necessary  TOBACCO USE  - Continue Chantix  ESSENTIAL HYPERTENSION  - Continue losartan  Breast cancer  - Under the care of Dr. Welton Flakes  - Last chemotherapy 12/30/2012 and Neulasta support 12/31/2012  Anemia of chronic disease  - Secondary to history of breast cancer and sequela of chemotherapy  - Hemoglobin 12.2 on admission and today is 11.8 - No indications for transfusion  Thrombocytopenia, unspecified  - Secondary to sequela of chemotherapy  - Platelet count ranges 73-93  - SCDs for DVT prophylaxis   Code Status: full code  Family Communication: no family at the bedside  Disposition Plan: home when stable   Manson Passey, MD  [facility]  Pager [phone]   Consultants:  None  Procedures:  None  Antibiotics:  Vancomycin 01/05/2013 -->  Cefepime 01/05/2013 --> Fluconazole 01/07/2013 --> If 7PM-7AM, please contact  night-coverage [email or web address] Password [email or web address] 01/07/2013, 12:00 PM   LOS: 2 days   HPI/Subjective: Patient reports having mucositis, sore throat.  Objective: Filed Vitals:   01/06/13 0500 01/06/13 1349 01/06/13 2123 01/07/13 0547  BP: 104/60 105/74 121/45 103/64  Pulse: 99 0 98 88  Temp: 99 F (37.2 C) 99 F (37.2 C) 99.6 F (37.6 C) 98.2 F (36.8 C)  TempSrc: Oral  Oral Oral  Resp: 18 20 20 18   Height:      Weight:      SpO2: 100% 99% 100% 98%    Intake/Output Summary (Last 24 hours) at 01/07/13 1200 Last data filed at 01/06/13 1700  Gross per 24 hour  Intake    600 ml  Output    700 ml  Net   -100 ml    Exam:   General:  Pt is alert, follows commands appropriately, not in acute distress  Cardiovascular: Regular rate and rhythm, S1/S2, no murmurs, no rubs, no gallops  Respiratory: Clear to auscultation bilaterally, no wheezing, no crackles, no rhonchi  Abdomen: Soft, non tender, non distended, bowel sounds present, no guarding  Extremities: No edema, pulses DP and PT palpable bilaterally  Neuro: Grossly nonfocal  Data Reviewed: Basic Metabolic Panel:  Recent Labs Lab 01/05/13 2245 01/07/13 0642  NA 133* 135  K 4.2 3.9  CL 101 101  CO2 25 26  GLUCOSE 116* 104*  BUN 15 11  CREATININE 0.54 0.60  CALCIUM 8.7 8.7   Liver Function Tests:  Recent Labs Lab 01/05/13 2245  AST 10  ALT 15  ALKPHOS 67  BILITOT 0.5  PROT  5.9*  ALBUMIN 3.1*   CBC:  Recent Labs Lab 01/06/13 0006 01/06/13 0630 01/07/13 0642  WBC 1.0* 0.8* 1.6*  NEUTROABS 0.0*  --   --   HGB 12.2 11.7* 11.8*  HCT 35.0* 35.0* 34.1*  MCV 91.4 91.4 91.4  PLT 93* 78* 73*    Studies: No results found.  Scheduled Meds: . aspirin  81 mg Oral Daily  . atorvastatin  10 mg Oral Daily  . ceFEPime (MAXIPIME) IV  2 g Intravenous Q8H  . filgrastim (NEUPOGEN)  SQ  480 mcg Subcutaneous q1800  . losartan  50 mg Oral Daily  . pantoprazole  80 mg Oral Daily  . vancomycin  1,000 mg  Intravenous Q8H  . varenicline  0.5 mg Oral Daily

## 2013-01-07 NOTE — Consult Note (Signed)
Amy Leonard   DOB:04/05/1961   WJ#:[id number]   AOZ#:[id number]  Subjective:52 year old female with stage IA bilateral breast cancers.  She is currently cycle 1 day 9 of adjuvant adriamycin/cytoxan with Neulasta support and admitted with febrile neutropenia.  She presented on 01/05/13 with afevers of 100.9 and WBC of 1. Blood cultures were drawn and show NGTD, and a urinalysis was negative.  She has been on Cefepime and Vancomycin since admission and Fluconazole since yesterday for mucositis.  She is feeling well.  She is ready to go home.  She is tolerating the antibiotics well and is eating, drinking, walking, and denies pain, nausea, vomiting, constipation, or any further concerns.    Objective:  Filed Vitals:   01/07/13 1318  BP: 97/66  Pulse: 114  Temp: 97.7 F (36.5 C)  Resp: 20    Body mass index is 33.91 kg/(m^2).  Intake/Output Summary (Last 24 hours) at 01/07/13 1536 Last data filed at 01/07/13 1300  Gross per 24 hour  Intake    960 ml  Output    600 ml  Net    360 ml     Sclerae unicteric  Oropharynx clear  No peripheral adenopathy  Lungs clear -- no rales or rhonchi  Heart regular rate and rhythm  Abdomen benign  MSK no focal spinal tenderness, no peripheral edema  Neuro nonfocal  Breast exam: deferred  CBG (last 3)  No results found for this basename: GLUCAP,  in the last 72 hours   Labs:  Lab Results  Component Value Date   WBC 1.6* 01/07/2013   HGB 11.8* 01/07/2013   HCT 34.1* 01/07/2013   MCV 91.4 01/07/2013   PLT 73* 01/07/2013   NEUTROABS 0.0* 01/06/2013    Urine Studies No results found for this basename: UACOL, UAPR, USPG, UPH, UTP, UGL, UKET, UBIL, UHGB, UNIT, UROB, ULEU, UEPI, UWBC, URBC, UBAC, CAST, CRYS, UCOM, BILUA,  in the last 72 hours  Basic Metabolic Panel:  Recent Labs Lab 01/05/13 2245 01/07/13 0642  NA 133* 135  K 4.2 3.9  CL 101 101  CO2 25 26  GLUCOSE 116* 104*  BUN 15 11  CREATININE 0.54 0.60  CALCIUM 8.7 8.7    GFR Estimated Creatinine Clearance: 86 ml/min (by C-G formula based on Cr of 0.6). Liver Function Tests:  Recent Labs Lab 01/05/13 2245  AST 10  ALT 15  ALKPHOS 67  BILITOT 0.5  PROT 5.9*  ALBUMIN 3.1*   No results found for this basename: LIPASE, AMYLASE,  in the last 168 hours No results found for this basename: AMMONIA,  in the last 168 hours Coagulation profile No results found for this basename: INR, PROTIME,  in the last 168 hours  CBC:  Recent Labs Lab 01/06/13 0006 01/06/13 0630 01/07/13 0642  WBC 1.0* 0.8* 1.6*  NEUTROABS 0.0*  --   --   HGB 12.2 11.7* 11.8*  HCT 35.0* 35.0* 34.1*  MCV 91.4 91.4 91.4  PLT 93* 78* 73*   Cardiac Enzymes: No results found for this basename: CKTOTAL, CKMB, CKMBINDEX, TROPONINI,  in the last 168 hours BNP: No components found with this basename: POCBNP,  CBG: No results found for this basename: GLUCAP,  in the last 168 hours D-Dimer No results found for this basename: DDIMER,  in the last 72 hours Hgb A1c No results found for this basename: HGBA1C,  in the last 72 hours Lipid Profile No results found for this basename: CHOL, HDL, LDLCALC, TRIG, CHOLHDL, LDLDIRECT,  in the last 72 hours Thyroid function studies No results found for this basename: TSH, T4TOTAL, FREET3, T3FREE, THYROIDAB,  in the last 72 hours Anemia work up No results found for this basename: VITAMINB12, FOLATE, FERRITIN, TIBC, IRON, RETICCTPCT,  in the last 72 hours Microbiology Recent Results (from the past 240 hour(s))  CULTURE, BLOOD (ROUTINE X 2)     Status: None   Collection Time    01/06/13 12:30 AM      Result Value Range Status   Specimen Description BLOOD LH   Final   Special Requests NONE BOTTLES DRAWN AEROBIC AND ANAEROBIC 5CC   Final   Culture  Setup Time 01/06/2013 09:10   Final   Culture     Final   Value:        BLOOD CULTURE RECEIVED NO GROWTH TO DATE CULTURE WILL BE HELD FOR 5 DAYS BEFORE ISSUING A FINAL NEGATIVE REPORT   Report  Status PENDING   Incomplete  CULTURE, BLOOD (ROUTINE X 2)     Status: None   Collection Time    01/06/13 12:40 AM      Result Value Range Status   Specimen Description BLOOD LEFT ANTECUBITAL   Final   Special Requests NONE BOTTLES DRAWN AEROBIC AND ANAEROBIC 4CC   Final   Culture  Setup Time 01/06/2013 09:10   Final   Culture     Final   Value:        BLOOD CULTURE RECEIVED NO GROWTH TO DATE CULTURE WILL BE HELD FOR 5 DAYS BEFORE ISSUING A FINAL NEGATIVE REPORT   Report Status PENDING   Incomplete      Studies:  No results found.  Assessment/Plan: 52 y.o. female with stage IA bilateral breast cancer undergoing adjuvant treatment with AC with Neulasta support, cycle 1 day 9.  1. Febrile neutropenia: Continue antibiotics.  WBC is uptrending, would recommend checking a differential tomorrow and possibly discharging if afebrile and hemodynamically stable on Cipro.    2. Mucositis: Continue Fluconazole and Nystatin rinse.    3. Other medical problems per primary team.   4. Will follow up with Ms. Amy Leonard next week.    Cherie Ouch Lyn Hollingshead, NP Medical Oncology [facility] Phone: [phone]   Augustin Schooling 01/07/2013  ATTENDING'S ATTESTATION:  I personally reviewed patient's chart, examined patient myself, formulated the treatment plan as followed.    clinically stable, counts improving. If the trend continues for 5/16, recommend d/c to home on oral cipro 500 mg BID x 7 days. We will follow up with her in clinic next week  Thank you for all you do for our patients!  Drue Second, MD Medical/Oncology [facility] [phone] (beeper) [phone] (Office)

## 2013-01-08 LAB — BASIC METABOLIC PANEL
BUN: 10 mg/dL (ref 6–23)
Chloride: 100 mEq/L (ref 96–112)
Creatinine, Ser: 0.62 mg/dL (ref 0.50–1.10)
GFR calc Af Amer: >90 mL/min (ref >90)

## 2013-01-08 LAB — CBC
HCT: 31.9 % — ABNORMAL LOW (ref 36.0–46.0)
MCHC: 33.2 g/dL (ref 30.0–36.0)
MCV: 90.6 fL (ref 78.0–100.0)
RDW: 12.2 % (ref 11.5–15.5)
WBC: 2.8 10*3/uL — ABNORMAL LOW (ref 4.0–10.5)

## 2013-01-08 MED ORDER — SIMETHICONE 80 MG PO CHEW: 80.0000 mg | CHEWABLE_TABLET | Freq: Three times a day (TID) | ORAL | Status: DC | PRN

## 2013-01-08 MED ORDER — FLUCONAZOLE 100 MG PO TABS: 200.0000 mg | ORAL_TABLET | Freq: Every day | ORAL | Status: DC

## 2013-01-08 MED ORDER — HEPARIN SOD (PORK) LOCK FLUSH 100 UNIT/ML IV SOLN
INTRAVENOUS | Status: AC
  Administered 2013-01-08: 500 [IU]
  Filled 2013-01-08: qty 5

## 2013-01-08 MED ORDER — ATIVAN 0.5 MG PO TABS: 0.5000 mg | ORAL_TABLET | Freq: Four times a day (QID) | ORAL | Status: DC | PRN

## 2013-01-08 MED ORDER — NYSTATIN 100000 UNIT/ML MT SUSP: 5.0000 mL | Freq: Two times a day (BID) | OROMUCOSAL | Status: DC

## 2013-01-08 MED ORDER — LORAZEPAM 0.5 MG PO TABS
0.5000 mg | ORAL_TABLET | Freq: Once | ORAL | Status: AC
  Administered 2013-01-08: 0.5 mg via ORAL

## 2013-01-08 MED ORDER — CIPROFLOXACIN HCL 500 MG PO TABS: 500.0000 mg | ORAL_TABLET | Freq: Two times a day (BID) | ORAL | Status: DC

## 2013-01-08 NOTE — Discharge Summary (Signed)
Physician Discharge Summary  Amy Leonard ZOX:[id number] DOB: 06-13-1961 DOA: 01/05/2013  PCP: [name], MD  Admit date: 01/05/2013 Discharge date: 01/08/2013  Recommendations for Outpatient Follow-up:  1. And will follow with Cancer Center per scheduled appointment 2. Please followup white blood cell count and platelet count outpatient; at the time of discharge white blood cell count is 2.8 and platelet count is 83 3. Patient will take ciprofloxacin 500 mg twice daily for 7 days for febrile neutropenia 4. Patient will continue taking fluconazole 200 mg daily for 2 weeks upon discharge for a mucositis as well as nystatin swish and swallow suspension  Discharge Diagnoses:  Principal Problem:   Febrile neutropenia Active Problems:   Pure hypercholesterolemia   ANXIETY DEPRESSION   TOBACCO USE   ESSENTIAL HYPERTENSION   Breast cancer   Anemia of chronic disease   Thrombocytopenia, unspecified  Discharge Condition: Medically stable for discharge home today  Diet recommendation: As tolerated  History of present illness:  52 y.o. female with past medical history of bilateral breast cancer triple negative on the right side, ER positive on the left side, status post bilateral lumpectomies, on chemotherapy (under Dr. Milta Deiters care), last chemotherapy given 12/30/2012 who presented to [facility] ED 01/05/2013 with fever.  In ED, patient was found to have fever of 100.9 F with WBC count of 1.0. Platelet count was 93 and hemoglobin was WNL. Patient was started on fortaz on admission. This was subsequently discontinued and patient was started on vancomycin and cefepime for febrile neutropenia.  Assessment/Plan:   Principal Problem:  Febrile neutropenia  - Related to breast cancer and sequela of chemotherapy  - Patient was on vancomycin and cefepime empirically. Patient will be discharged with ciprofloxacin 500 mg twice daily for 7 days  - Follow up results of blood cultures - no growth to date -  Patient has received Neupogen for a total of 2 doses. White blood cell count is 2.8 today  Active Problems:  Mucositis  - Staredt fluconazole 400 mg daily IV. At the time of discharge continue fluconazole 200 mg daily for 2 weeks. - Started nystatin suspension twice daily but have instructed the patient she can use this up to 4 times a day. Patient reported significant improvement with this nystatin suspension. Pure hypercholesterolemia  - continue statin therapy  ANXIETY, DEPRESSION  - Lorazepam when necessary  TOBACCO USE  - Continue Chantix  ESSENTIAL HYPERTENSION  - Continue losartan  Breast cancer  - Under the care of Dr. Welton Flakes  - Last chemotherapy 12/30/2012 and Neulasta support 12/31/2012  - Appreciate Dr. Welton Flakes seeing the patient in hospital Anemia of chronic disease  - Secondary to history of breast cancer and sequela of chemotherapy  - Hemoglobin in range 10.6 - 11.4 - No indications for transfusion  Thrombocytopenia, unspecified  - Secondary to sequela of chemotherapy  - Platelet count ranges 73-93  - SCDs for DVT prophylaxis   Code Status: full code  Family Communication: no family at the bedside  Disposition Plan: home today  Manson Passey, MD  [facility]  Pager [phone]   Consultants:  None  Procedures:  None  Antibiotics:  Vancomycin 01/05/2013 -->  Cefepime 01/05/2013 -->  Fluconazole 01/07/2013 -->    Discharge Exam: Filed Vitals:   01/08/13 0554  BP: 112/58  Pulse: 89  Temp: 97.9 F (36.6 C)  Resp: 18   Filed Vitals:   01/07/13 0547 01/07/13 1318 01/07/13 2240 01/08/13 0554  BP: 103/64 97/66 130/72 112/58  Pulse: 88 114  97 89  Temp: 98.2 F (36.8 C) 97.7 F (36.5 C) 97.9 F (36.6 C) 97.9 F (36.6 C)  TempSrc: Oral  Oral Oral  Resp: 18 20 20 18   Height:      Weight:      SpO2: 98% 98% 99% 99%    General: Pt is alert, follows commands appropriately, not in acute distress Cardiovascular: Regular rate and rhythm, S1/S2 +, no murmurs, no rubs,  no gallops Respiratory: Clear to auscultation bilaterally, no wheezing, no crackles, no rhonchi Abdominal: Soft, non tender, non distended, bowel sounds +, no guarding Extremities: no edema, no cyanosis, pulses palpable bilaterally DP and PT Neuro: Grossly nonfocal  Discharge Instructions  Discharge Orders   Future Appointments Provider Department Dept Phone   01/13/2013 10:45 AM Beverely Pace [facility] [place] [facility] [phone]   01/13/2013 11:15 AM Augustin Schooling, NP [place] [facility] [phone]   01/13/2013 12:15 PM [facility] [place] [facility] [phone]   01/14/2013 11:45 AM [facility] Inj Nurse [place] [facility] [phone]   01/14/2013 1:00 PM Baltazar Najjar [facility] [phone]   01/14/2013 2:00 PM Beverely Pace [facility] [place] [facility] [phone]   01/20/2013 9:00 AM [facility] ECHO LAB [phone]   01/20/2013 10:00 AM [facility] [place] [facility] [phone]   01/20/2013 12:45 PM Beverely Pace [facility] [place] [facility] [phone]   01/20/2013 1:15 PM Victorino December, MD [facility] [phone]   01/27/2013 12:15 PM [facility] [place] [facility] [phone]   01/28/2013 10:30 AM [facility] Inj Nurse [employer] [facility] [phone]   02/11/2013 8:45 AM Ernestene Mention, MD [facility] [place] [phone]   02/11/2013 10:30 AM [facility] Inj Nurse [place] [facility] [phone]   Future Orders Complete By Expires     Call MD for:  difficulty breathing, headache or visual disturbances  As directed     Call MD for:  persistant dizziness or light-headedness  As directed     Call MD for:  persistant nausea  and vomiting  As directed     Call MD for:  severe uncontrolled pain  As directed     Diet - low sodium heart healthy  As directed     Discharge instructions  As directed     Comments:      1. Continue Cipro 500 mg twice daily for next 7 days for your initial presentations of fevers and low WBC count 2. Use fluconazole 200 mg daily for 14 days and nystatin suspension up to 4 x daily for mucositis  3. Cancer center will touch base with you for the appointment    Increase activity slowly  As directed         Medication List    STOP taking these medications       LORazepam 0.5 MG tablet  Commonly known as:  ATIVAN  Replaced by:  ATIVAN 0.5 MG tablet      TAKE these medications       acyclovir ointment 5 %  Commonly known as:  ZOVIRAX  Apply 1 application topically as needed (flare-up).     aspirin 81 MG chewable tablet  Chew 81 mg by mouth daily.     ATIVAN 0.5 MG tablet  Generic drug:  LORazepam  Take 1 tablet (0.5 mg total) by mouth every 6 (six) hours as needed (Nausea or vomiting).     atorvastatin 10 MG tablet  Commonly known as:  LIPITOR  Take 1 tablet (10 mg total) by mouth daily.     calcium carbonate 500 MG chewable tablet  Commonly known as:  TUMS - dosed in mg elemental calcium  Chew by mouth as directed.     CHANTIX 0.5 MG tablet  Generic drug:  varenicline  Take 0.5 mg by mouth daily.     ciprofloxacin 500 MG tablet  Commonly known as:  CIPRO  Take 1 tablet (500 mg total) by mouth 2 (two) times daily.     cyclobenzaprine 10 MG tablet  Commonly known as:  FLEXERIL  Take 10 mg by mouth as needed (muscle spasm).     dexamethasone 4 MG tablet  Commonly known as:  DECADRON  Take 2 tablets by mouth once a day on the day after chemotherapy and then take 2 tablets two times a day for 2 days. Take with food.     fluconazole 100 MG tablet  Commonly known as:  DIFLUCAN  Take 2 tablets (200 mg total) by mouth daily.     lidocaine-prilocaine cream  Commonly  known as:  EMLA  Apply topically as needed.     losartan 50 MG tablet  Commonly known as:  COZAAR  Take 1 tablet (50 mg total) by mouth daily.     nystatin 100000 UNIT/ML suspension  Commonly known as:  MYCOSTATIN  Take 5 mLs (500,000 Units total) by mouth 2 (two) times daily.     omeprazole 40 MG capsule  Commonly known as:  PRILOSEC  Take 1 capsule (40 mg total) by mouth 2 (two) times daily.     ondansetron 8 MG tablet  Commonly known as:  ZOFRAN  Take 1 tablet (8 mg total) by mouth 2 (two) times daily as needed. Take two times a day as needed for nausea or vomiting starting on the third day after chemotherapy.     prochlorperazine 10 MG tablet  Commonly known as:  COMPAZINE  Take 1 tablet (10 mg total) by mouth every 6 (six) hours as needed (Nausea or vomiting).     simethicone 80 MG chewable tablet  Commonly known as:  MYLICON  Chew 1 tablet (80 mg total) by mouth every 8 (eight) hours as needed for flatulence.     TYLENOL ARTHRITIS PAIN PO  Take 2 tablets by mouth as needed (pain).           Follow-up Information   Follow up with [name], MD In 1 week.   Contact information:   [address] [place] [address] [place] [place] [address] [phone]       Schedule an appointment as soon as possible for a visit with Drue Second, MD.   Contact information:   [address] [place] [address] [phone]        The results of significant diagnostics from this hospitalization (including imaging, microbiology, ancillary and laboratory) are listed below for reference.    Significant Diagnostic Studies: No results found.  Microbiology: Recent Results (from the past 240 hour(s))  CULTURE, BLOOD (ROUTINE X 2)     Status: None   Collection Time    01/06/13 12:30 AM      Result Value Range Status   Specimen Description BLOOD LH   Final   Special Requests NONE BOTTLES DRAWN  AEROBIC AND ANAEROBIC 5CC   Final   Culture  Setup Time  01/06/2013 09:10   Final   Culture     Final   Value:        BLOOD CULTURE RECEIVED NO GROWTH TO DATE CULTURE WILL BE HELD FOR 5 DAYS BEFORE ISSUING A FINAL NEGATIVE REPORT   Report Status PENDING   Incomplete  CULTURE, BLOOD (ROUTINE X 2)     Status: None   Collection Time    01/06/13 12:40 AM      Result Value Range Status   Specimen Description BLOOD LEFT ANTECUBITAL   Final   Special Requests NONE BOTTLES DRAWN AEROBIC AND ANAEROBIC 4CC   Final   Culture  Setup Time 01/06/2013 09:10   Final   Culture     Final   Value:        BLOOD CULTURE RECEIVED NO GROWTH TO DATE CULTURE WILL BE HELD FOR 5 DAYS BEFORE ISSUING A FINAL NEGATIVE REPORT   Report Status PENDING   Incomplete     Labs: Basic Metabolic Panel:  Recent Labs Lab 01/05/13 2245 01/07/13 0642 01/08/13 0555  NA 133* 135 135  K 4.2 3.9 4.0  CL 101 101 100  CO2 25 26 28   GLUCOSE 116* 104* 106*  BUN 15 11 10   CREATININE 0.54 0.60 0.62  CALCIUM 8.7 8.7 9.1   Liver Function Tests:  Recent Labs Lab 01/05/13 2245  AST 10  ALT 15  ALKPHOS 67  BILITOT 0.5  PROT 5.9*  ALBUMIN 3.1*   No results found for this basename: LIPASE, AMYLASE,  in the last 168 hours No results found for this basename: AMMONIA,  in the last 168 hours CBC:  Recent Labs Lab 01/06/13 0006 01/06/13 0630 01/07/13 0642 01/08/13 0555  WBC 1.0* 0.8* 1.6* 2.8*  NEUTROABS 0.0*  --   --   --   HGB 12.2 11.7* 11.8* 10.6*  HCT 35.0* 35.0* 34.1* 31.9*  MCV 91.4 91.4 91.4 90.6  PLT 93* 78* 73* 83*   Cardiac Enzymes: No results found for this basename: CKTOTAL, CKMB, CKMBINDEX, TROPONINI,  in the last 168 hours BNP: BNP (last 3 results) No results found for this basename: PROBNP,  in the last 8760 hours CBG: No results found for this basename: GLUCAP,  in the last 168 hours  Time coordinating discharge: Over 30 minutes  Signed:  Manson Passey, MD  [facility]  01/08/2013, 10:32 AM  Pager #: [phone]

## 2013-01-08 NOTE — Progress Notes (Signed)
Pt ready for d/c. Went over d/c instructions with pt, instructed on the importance of hand hygiene and avoiding exposure to people with symptoms of infection. Deaccessed PAC, WNL. Pt d/c'd to home with family. Eugene Garnet RN

## 2013-01-11 ENCOUNTER — Encounter: Payer: Self-pay | Admitting: Oncology

## 2013-01-11 NOTE — Progress Notes (Signed)
Called and advised the patient of [employer] 600.00 and [facility] fund 400.00. She will sign the forms on Wednesday. She is aware of one time grant for both.

## 2013-01-12 LAB — CULTURE, BLOOD (ROUTINE X 2): Culture: NO GROWTH

## 2013-01-13 ENCOUNTER — Ambulatory Visit (HOSPITAL_BASED_OUTPATIENT_CLINIC_OR_DEPARTMENT_OTHER): Payer: BC Managed Care – PPO

## 2013-01-13 ENCOUNTER — Other Ambulatory Visit (HOSPITAL_BASED_OUTPATIENT_CLINIC_OR_DEPARTMENT_OTHER): Payer: BC Managed Care – PPO | Admitting: Lab

## 2013-01-13 ENCOUNTER — Telehealth: Payer: Self-pay | Admitting: *Deleted

## 2013-01-13 ENCOUNTER — Ambulatory Visit (HOSPITAL_BASED_OUTPATIENT_CLINIC_OR_DEPARTMENT_OTHER): Payer: BC Managed Care – PPO | Admitting: Adult Health

## 2013-01-13 ENCOUNTER — Encounter: Payer: Self-pay | Admitting: Adult Health

## 2013-01-13 VITALS — BP 125/82 | HR 94 | Temp 98.5°F | Resp 20 | Ht 63.0 in | Wt 191.5 lb

## 2013-01-13 DIAGNOSIS — C50411 Malignant neoplasm of upper-outer quadrant of right female breast: Secondary | ICD-10-CM

## 2013-01-13 DIAGNOSIS — C50919 Malignant neoplasm of unspecified site of unspecified female breast: Secondary | ICD-10-CM

## 2013-01-13 DIAGNOSIS — Z5111 Encounter for antineoplastic chemotherapy: Secondary | ICD-10-CM

## 2013-01-13 DIAGNOSIS — C50419 Malignant neoplasm of upper-outer quadrant of unspecified female breast: Secondary | ICD-10-CM

## 2013-01-13 DIAGNOSIS — Z170 Estrogen receptor positive status [ER+]: Secondary | ICD-10-CM

## 2013-01-13 LAB — COMPREHENSIVE METABOLIC PANEL (CC13)
BUN: 12.3 mg/dL (ref 7.0–26.0)
CO2: 24 mEq/L (ref 22–29)
Calcium: 8.7 mg/dL (ref 8.4–10.4)
Chloride: 105 mEq/L (ref 98–107)
Creatinine: 0.7 mg/dL (ref 0.6–1.1)
Glucose: 136 mg/dl — ABNORMAL HIGH (ref 70–99)

## 2013-01-13 LAB — CBC WITH DIFFERENTIAL/PLATELET
Eosinophils Absolute: 0 10*3/uL (ref 0.0–0.5)
HCT: 36.9 % (ref 34.8–46.6)
LYMPH%: 21.9 % (ref 14.0–49.7)
MONO#: 1.4 10*3/uL — ABNORMAL HIGH (ref 0.1–0.9)
NEUT#: 6.4 10*3/uL (ref 1.5–6.5)
NEUT%: 63 % (ref 38.4–76.8)
Platelets: 338 10*3/uL (ref 145–400)
WBC: 10.1 10*3/uL (ref 3.9–10.3)

## 2013-01-13 MED ORDER — SODIUM CHLORIDE 0.9 % IV SOLN
150.0000 mg | Freq: Once | INTRAVENOUS | Status: AC
  Administered 2013-01-13: 150 mg via INTRAVENOUS
  Filled 2013-01-13: qty 5

## 2013-01-13 MED ORDER — DOXORUBICIN HCL CHEMO IV INJECTION 2 MG/ML
60.0000 mg/m2 | Freq: Once | INTRAVENOUS | Status: AC
  Administered 2013-01-13: 120 mg via INTRAVENOUS
  Filled 2013-01-13: qty 60

## 2013-01-13 MED ORDER — PALONOSETRON HCL INJECTION 0.25 MG/5ML
0.2500 mg | Freq: Once | INTRAVENOUS | Status: AC
  Administered 2013-01-13: 0.25 mg via INTRAVENOUS

## 2013-01-13 MED ORDER — SODIUM CHLORIDE 0.9 % IV SOLN
Freq: Once | INTRAVENOUS | Status: AC
  Administered 2013-01-13: 13:00:00 via INTRAVENOUS

## 2013-01-13 MED ORDER — SODIUM CHLORIDE 0.9 % IJ SOLN
10.0000 mL | INTRAMUSCULAR | Status: DC | PRN
  Filled 2013-01-13: qty 10

## 2013-01-13 MED ORDER — SODIUM CHLORIDE 0.9 % IV SOLN
600.0000 mg/m2 | Freq: Once | INTRAVENOUS | Status: AC
  Administered 2013-01-13: 1200 mg via INTRAVENOUS
  Filled 2013-01-13: qty 60

## 2013-01-13 MED ORDER — HEPARIN SOD (PORK) LOCK FLUSH 100 UNIT/ML IV SOLN
500.0000 [IU] | Freq: Once | INTRAVENOUS | Status: DC | PRN
  Filled 2013-01-13: qty 5

## 2013-01-13 MED ORDER — DEXAMETHASONE SODIUM PHOSPHATE 20 MG/5ML IJ SOLN
12.0000 mg | Freq: Once | INTRAMUSCULAR | Status: AC
  Administered 2013-01-13: 12 mg via INTRAVENOUS

## 2013-01-13 NOTE — Patient Instructions (Signed)
Doing well.  Proceed with chemotherapy.  Take Super B complex.  Also, take the Decadron/Dexamethasone, 2 tablets once, the day after chemotherapy, then one tablet twice a day for two days.  Please call us if you have any questions or concerns.

## 2013-01-13 NOTE — Progress Notes (Signed)
OFFICE PROGRESS NOTE  CC  [name], MD [address] [place] [address], [place] [address] Dr. Claud Kelp  Dr. Antony Blackbird  DIAGNOSIS: 52 year old female with new diagnosis of bilateral breast cancers.   STAGE:  Cancer of upper-outer quadrant of female breast  Primary site: Breast (Bilateral)  Staging method: AJCC 7th Edition  Clinical: Stage IA (T1c, N0, cM0)  Summary: Stage IA (T1c, N0, cM0)   PRIOR THERAPY: #1Patient recently underwent screening mammograms and she was found to have bilateral breast abnormalities.diagnostic mammogram showed in the right breast an ill-defined 2 cm mass with a few associated well defined microcalcifications over the upper outer quadrant. Spot compression images of the left breast demonstrated an ill defined spiculated 8 mm mass centrally. Ultrasound showed irregular bordered heterogeneous hypoechoic solid mass at the 10:00 position of the right breast 6 cm from the nipple measuring 1.1 x 1.3 x 1.4 cm. Also in this location was an adjacent smaller irregular hypoechoic mass measuring 3 x 5 x 6 mm. The smaller mass was 1 cm from the large more irregular mass. Ultrasound of the axilla demonstrated single lymph node with thickened cortex. Ultrasound of the left breast demonstrated an ill-defined hypoechoic mass with distal acoustic shadowing 7 to 8:00 position 3 cm from the nipple located deep and measuring 5 x 8 x 8 mm ultrasound of the left axilla showed normal appearing lymph nodes.   #2Patient went on to have needle core biopsies performed of both of the masses. 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 The tumor was ER positive PR positive with a Ki-67 of 9% the second tumor was ER negative PR negative HER-2/neu negative with Ki-6792% and elevated.  #3 Patient had MRIs of the breasts performed. There was  noted to be 1.7 cm irregular enhancing mass located within the upper-outer quadrant of the right breast with 3 adjacent worrisome enhancing satellite nodules in aggregate the nodules and mass measures 3.8 cm. In the left breast 8 mm irregular enhancing mass located within the central left breast was noted. No evidence of axillary or internal mammary adenopathy.  #4patient is status post bilateral lumpectomies. The right breast revealed a 1.5 cm grade 3 invasive ductal carcinoma ER/PR negative HER-2/neu negative Ki-67 92%. Sentinel node was negative. Left breast showed 1.0 cm grade 3 invasive ductal carcinoma ER/PR positive HER-2/neu negative sentinel node was negative.  #5 patient is to begin adjuvant chemotherapy consisting of Adriamycin Cytoxan every 2 weeks for a total of 4 cycles. This would then be followed by Taxol and carboplatinum weekly for 12 weeks. Patient will need adjuvant radiation therapy. Because one of the tumor is ER positive she will also receive antiestrogen therapy.  CURRENT THERAPY:cycle 2 day 1 of adjuvant dose dense AC  INTERVAL HISTORY: Amy Leonard 52 y.o. female returns for followup visit prior to cycle two of her adjuvant Adriamycin/Cytoxan. After her first cycle she developed febrile neutropenia and required hospitalization, IV antibiotics, and monitoring.  She was discharged this past Friday, and drove up to her son's pin[place].  She is very fatigued today.  She would like to decrease her steroids due to shakiness she feels after taking them. Otherwise she denies fevers, chills, nausea, vomiting, constipation, diarrhea, numbness, skin changes or any further concerns.   MEDICAL HISTORY: Past Medical History  Diagnosis Date  . Elevated WBC  count     nl diff  . Chronic back pain     spinal stenosis  . Tobacco abuse   . Anxiety and depression   . HSV infection     recurrent (side and buttox)  . Migraine   . Glaucoma(365)     ?  Marland Kitchen Carpal tunnel  syndrome   . HLD (hyperlipidemia)   . Folliculitis 05/05/2011  . Anxiety   . Depression   . 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 GERD (gastroesophageal reflux disease)   . Hypertension   . Breast cancer   . Wears dentures     top    ALLERGIES:  is allergic to bupropion hcl; chlorhexidine gluconate; hydrocod polst-cpm polst er; lisinopril; and naproxen sodium.  MEDICATIONS:  Current Outpatient Prescriptions  Medication Sig Dispense Refill  . Acetaminophen (TYLENOL ARTHRITIS PAIN PO) Take 2 tablets by mouth as needed (pain).       Marland Kitchen acyclovir (ZOVIRAX) 5 % ointment Apply 1 application topically as needed (flare-up).       Marland Kitchen aspirin 81 MG chewable tablet Chew 81 mg by mouth daily.      . ATIVAN 0.5 MG tablet Take 1 tablet (0.5 mg total) by mouth every 6 (six) hours as needed (Nausea or vomiting).  45 tablet  0  . atorvastatin (LIPITOR) 10 MG tablet Take 1 tablet (10 mg total) by mouth daily.  90 tablet  1  . calcium carbonate (TUMS - DOSED IN MG ELEMENTAL CALCIUM) 500 MG chewable tablet Chew by mouth as directed.        . CHANTIX 0.5 MG tablet Take 0.5 mg by mouth daily.       . ciprofloxacin (CIPRO) 500 MG tablet Take 1 tablet (500 mg total) by mouth 2 (two) times daily.  14 tablet  0  . COMPRO 25 MG suppository       . cyclobenzaprine (FLEXERIL) 10 MG tablet Take 10 mg by mouth as needed (muscle spasm).       Marland Kitchen dexamethasone (DECADRON) 4 MG tablet Take 2 tablets by mouth once a day on the day after chemotherapy and then take 2 tablets two times a day for 2 days. Take with food.  30 tablet  1  . fluconazole (DIFLUCAN) 100 MG tablet Take 2 tablets (200 mg total) by mouth daily.  14 tablet  0  . lidocaine-prilocaine (EMLA) cream Apply topically as needed.  30 g  8  . losartan (COZAAR) 50 MG tablet Take 1 tablet (50 mg total) by mouth daily.  90 tablet  2  . nystatin (MYCOSTATIN) 100000 UNIT/ML suspension Take 5 mLs (500,000 Units total) by mouth 2 (two) times daily.  60 mL  3  .  omeprazole (PRILOSEC) 40 MG capsule Take 1 capsule (40 mg total) by mouth 2 (two) times daily.  180 capsule  3  . ondansetron (ZOFRAN) 8 MG tablet Take 1 tablet (8 mg total) by mouth 2 (two) times daily as needed. Take two times a day as needed for nausea or vomiting starting on the third day after chemotherapy.  30 tablet  1  . prochlorperazine (COMPAZINE) 10 MG tablet Take 1 tablet (10 mg total) by mouth every 6 (six) hours as needed (Nausea or vomiting).  30 tablet  1  . simethicone (MYLICON) 80 MG chewable tablet Chew 1 tablet (80 mg total) by mouth every 8 (eight) hours as needed for flatulence.  60 tablet  3   No current facility-administered medications for this visit.  Facility-Administered Medications Ordered in Other Visits  Medication Dose Route Frequency Provider Last Rate Last Dose  . cyclophosphamide (CYTOXAN) 1,200 mg in sodium chloride 0.9 % 250 mL chemo infusion  600 mg/m2 (Treatment Plan Actual) Intravenous Once Victorino December, MD      . Dexamethasone Sodium Phosphate (DECADRON) injection 12 mg  12 mg Intravenous Once Victorino December, MD      . DOXOrubicin (ADRIAMYCIN) chemo injection 120 mg  60 mg/m2 (Treatment Plan Actual) Intravenous Once Victorino December, MD      . fosaprepitant (EMEND) 150 mg in sodium chloride 0.9 % 145 mL IVPB  150 mg Intravenous Once Victorino December, MD      . heparin lock flush 100 unit/mL  500 Units Intracatheter Once PRN Victorino December, MD      . palonosetron (ALOXI) injection 0.25 mg  0.25 mg Intravenous Once Victorino December, MD      . sodium chloride 0.9 % injection 10 mL  10 mL Intracatheter PRN Victorino December, MD        SURGICAL HISTORY:  Past Surgical History  Procedure Laterality Date  . Btl    . Epidural steroid injection  2008  . Colonoscopy    . Upper gi endoscopy    . Partial mastectomy with needle localization and axillary sentinel lymph node bx Bilateral 11/23/2012    Procedure: PARTIAL MASTECTOMY WITH NEEDLE LOCALIZATION AND AXILLARY  SENTINEL LYMPH NODE BX;  Surgeon: Ernestene Mention, MD;  Location: [facility];  Service: General;  Laterality: Bilateral;  . Portacath placement Right 11/23/2012    Procedure: INSERTION PORT-A-CATH;  Surgeon: Ernestene Mention, MD;  Location: [facility];  Service: General;  Laterality: Right;    REVIEW OF SYSTEMS:  Pertinent items are noted in HPI.   PHYSICAL EXAMINATION: Blood pressure 125/82, pulse 94, temperature 98.5 F (36.9 C), temperature source Oral, resp. rate 20, height 5\' 3"  (1.6 m), weight 191 lb 8 oz (86.864 kg). Body mass index is 33.93 kg/(m^2). General: Patient is a well appearing female in no acute distress HEENT: PERRLA, sclerae anicteric no conjunctival pallor, MMM Neck: supple, no palpable adenopathy Lungs: clear to auscultation bilaterally, no wheezes, rhonchi, or rales Cardiovascular: regular rate rhythm, S1, S2, no murmurs, rubs or gallops Abdomen: Soft, non-tender, non-distended, normoactive bowel sounds, no HSM Extremities: warm and well perfused, no clubbing, cyanosis, or edema Skin: No rashes or lesions Neuro: Non-focal Breasts: Bilateral lumpectomy sites without nodularity, no masses or skin changes in either breast. ECOG PERFORMANCE STATUS: 0 - Asymptomatic  LABORATORY DATA: Lab Results  Component Value Date   WBC 10.1 01/13/2013   HGB 12.2 01/13/2013   HCT 36.9 01/13/2013   MCV 93.4 01/13/2013   PLT 338 01/13/2013      Chemistry      Component Value Date/Time   NA 135 01/08/2013 0555   NA 139 12/30/2012 0939   K 4.0 01/08/2013 0555   K 3.6 12/30/2012 0939   CL 100 01/08/2013 0555   CL 107 12/30/2012 0939   CO2 28 01/08/2013 0555   CO2 23 12/30/2012 0939   BUN 10 01/08/2013 0555   BUN 12.5 12/30/2012 0939   CREATININE 0.62 01/08/2013 0555   CREATININE 0.7 12/30/2012 0939      Component Value Date/Time   CALCIUM 9.1 01/08/2013 0555   CALCIUM 8.6 12/30/2012 0939   ALKPHOS 67 01/05/2013 2245   ALKPHOS 69 12/30/2012 0939   AST 10 01/05/2013  2245  AST 13 12/30/2012 0939   ALT 15 01/05/2013 2245   ALT 17 12/30/2012 0939   BILITOT 0.5 01/05/2013 2245   BILITOT 0.33 12/30/2012 0939    ADDITIONAL INFORMATION: 1. CHROMOGENIC IN-SITU HYBRIDIZATION Results: HER-2/NEU BY CISH - NO AMPLIFICATION OF HER-2 DETECTED. RESULT RATIO OF HER2: CEP 17 SIGNALS 1.00 AVERAGE HER2 COPY NUMBER PER CELL 1.55 REFERENCE RANGE NEGATIVE HER2/Chr17 Ratio <2.0 and Average HER2 copy number <4.0 EQUIVOCAL HER2/Chr17 Ratio <2.0 and Average HER2 copy number 4.0 and <6.0 POSITIVE HER2/Chr17 Ratio >=2.0 and/or Average HER2 copy number >=6.0 Pecola Leisure MD Pathologist, Electronic Signature ( Signed 12/01/2012) 3. CHROMOGENIC IN-SITU HYBRIDIZATION Results: HER-2/NEU BY CISH - NO AMPLIFICATION OF HER-2 DETECTED. RESULT RATIO OF HER2: CEP 17 SIGNALS 1.22 AVERAGE HER2 COPY NUMBER PER CELL 1.40 REFERENCE RANGE NEGATIVE HER2/Chr17 Ratio <2.0 and Average HER2 copy number <4.0 EQUIVOCAL HER2/Chr17 Ratio <2.0 and Average HER2 copy number 4.0 and <6.0 POSITIVE HER2/Chr17 Ratio >=2.0 and/or Average HER2 copy number >=6.0 1 of 5 FINAL for LICHELLE, VIETS ([id number]) ADDITIONAL INFORMATION:(continued) Pecola Leisure MD Pathologist, Electronic Signature ( Signed 12/01/2012) FINAL DIAGNOSIS Diagnosis 1. Breast, lumpectomy, Right - INVASIVE DUCTAL CARCINOMA, GRADE III, SEE COMMENT. - INVASIVE CARCINOMA IS 0.9 CM FROM NEAREST MARGIN (INFERIOR). - NO LYMPHOVASCULAR INVASION IDENTIFIED. - HIGH GRADE DUCTAL CARCINOMA IN SITU - IN SITU CARCINOMA IS 1 MM FROM THE NEAREST LATERAL MARGIN - SEE TUMOR SYNOPTIC TEMPLATE BELOW. 2. Lymph nodes, regional resection, Right axilla - ELEVEN LYMPH NODES, NEGATIVE FOR TUMOR (0/11). 3. Breast, lumpectomy, Left - INVASIVE DUCTAL CARCINOMA, GRADE I, SEE COMMENT. - LYMPHOVASCULAR INVASION IDENTIFIED - INVASIVE CARCINOMA IS BROADLY PRESENT AT POSTERIOR MARGIN - DUCTAL CARCINOMA IN SITU, GRADE I. - IN SITU CARCINOMA IS FOCALLY PRESENT  AT POSTERIOR MARGIN - SEE TUMOR SYNOPTIC TEMPLATE BELOW. 4. Breast, excision, Left - BENIGN BREAST TISSUE, SEE COMMENT. - NEGATIVE FOR ATYPIA OR MALIGNANCY. - SURGICAL MARGIN, NEGATIVE FOR ATYPIA OR MALIGNANCY. 5. Lymph node, sentinel, biopsy, Left axillary - ONE LYMPH NODE, POSITIVE FOR METASTATIC MAMMARY CARCINOMA (1/1). Microscopic Comment 1. BREAST, INVASIVE TUMOR, WITH LYMPH NODE SAMPLING Specimen, including laterality: Right breast Procedure: Lumpectomy Grade: III of III Tubule formation: 3 Nuclear pleomorphism: 3 Mitotic: 3 Tumor size (gross measurement: 1.5 cm Margins: Invasive, distance to closest margin: 0.9 cm In-situ, distance to closest margin: 0.1 cm If margin positive, focally or broadly: N/A Lymphovascular invasion: Absent Ductal carcinoma in situ: Present Grade: III of III Extensive intraductal component: Absent Lobular neoplasia: Absent Tumor focality: Multifocal Treatment effect: None 2 of 5 FINAL for LUWANDA, STARR ([id number]) Microscopic Comment(continued) If present, treatment effect in breast tissue, lymph nodes or both: N/A Extent of tumor: Skin: N/A Nipple: N/A Skeletal muscle: N/A Lymph nodes: # examined: 11 (Part 2) Lymph nodes with metastasis: 0 Breast prognostic profile: Estrogen receptor: Repeated, previous study demonstrated 0% positivity ([id number]) Progesterone receptor: Repeated, previous study demonstrated 0% positivity ([id number]) Her 2 neu: Repeated, previous study demonstrated no amplification (1.38) ([id number]) Ki-67: Not repeated, previous study demonstrated 92% proliferation rate ([id number]) Non-neoplastic breast: Previous biopsy site, fibrocystic change, sclerosing adenosis and benign calcifications in benign ducts and lobules. TNM: pT1c, pN0, pMX Comments: There are a total of four distinct areas of abnormality identified. The first 1.5 cm corresponds to the invasive ductal carcinoma. The second 0.9 cm area consists  of high grade ductal carcinoma in situ with associated fibrocystic change and fibroadenomatoid nodules. In this focus, the in situ carcinoma is 1 mm from the nearest lateral margin. The third 0.4 cm  area consists of fat necrosis and fibrosis. There are no atypical or malignant findings in this area. The fourth and final 0.3 cm area demonstrate non-neoplastic findings to include fibrocystic change. There are no atypical or malignant epithelial findings in this area either. 3. BREAST, INVASIVE TUMOR, WITH LYMPH NODE SAMPLING Specimen, including laterality: Left breast Procedure: Lumpectomy Grade: I of III Tubule formation: 3 Nuclear pleomorphism: 3 Mitotic: 1 Tumor size (gross measurement): 1.0 cm Margins: Invasive, distance to closest margin: Present at posterior In-situ, distance to closest margin: Present at posterior If margin positive, focally or broadly: Broadly (invasive) and focally (in situ) Lymphovascular invasion: Present Ductal carcinoma in situ: Present Grade: I of III Extensive intraductal component: Absent Lobular neoplasia: Absent Tumor focality: Unifocal Treatment effect: None If present, treatment effect in breast tissue, lymph nodes or both: N/A Extent of tumor: Skin: N/A Nipple: N/A Skeletal muscle: N/A Lymph nodes: # examined: 1 (Part 5) Lymph nodes with metastasis: 1 Isolated tumor cells (< 0.2 mm): 0 Micrometastasis: (> 0.2 mm and < 2.0 mm): 1 Macrometastasis: (> 2.0 mm): 0 3 of 5 FINAL for INIOLUWA, BOULAY ([id number]) Microscopic Comment(continued) Extracapsular extension: Present Breast prognostic profile: Estrogen receptor: Not repeated, previous study demonstrated 100% positivity ([id number]). Progesterone receptor: Not repeated, previous study demonstrated 25% positivity ([id number]) Her 2 neu: Repeated, previous study demonstrated no amplification (1.42) ([id number]) Ki-67: Not repeated, previous study demonstrated 9% proliferation rate  ([id number]) Non-neoplastic breast: Previous biopsy site, fibrocystic change and microcalcifications in benign ducts and lobules. TNM: pT1b, pN60mi, PMX Comments: The additional 1.0 cm and 1.1 cm hemorrhagic areas grossly identified consist of benign breast tissue with microcalcifications and parenchymal hemorrhage. There are no atypical or malignant findings in these areas. 4. The surgical resection margin(s) of the specimen were inked and microscopically evaluated. There is no mass grossly identified. Representative sections demonstrate predominantly benign fibrovascular and adipose tissue with focal areas of fibrocytic change and sclerosing adenosis. There are no atypical or malignant epithelial or stromal findings identified. 5. There are microscopic foci of intranodal metastatic mammary tumor deposits spanning up to 1 mm. There is focal extracapsular extension identified. (CR:kh 11-25-12) [place] RUND DO Pathologist, Electronic Signature (Case signed 11/25/2012) Specimen Gross and Clinical Information   RADIOGRAPHIC STUDIES:  Chest 2 View  11/19/2012  *RADIOLOGY REPORT*  Clinical Data: Preop for breast cancer  CHEST - 2 VIEW  Comparison: None.  Findings: Cardiomediastinal silhouette is unremarkable.  No acute infiltrate or pleural effusion.  No pulmonary edema.  Bony thorax is unremarkable.  IMPRESSION: No active disease.   Original Report Authenticated By: Natasha Mead, M.D.    Nm Sentinel Node Inj-no Rpt (breast)  11/23/2012  CLINICAL DATA: Bilateral breast cancer   Sulfur colloid was injected intradermally by the nuclear medicine  technologist for breast cancer sentinel node localization.     Dg Chest Portable 1 View  11/23/2012  *RADIOLOGY REPORT*  Clinical Data: 52 year old female status post Port-A-Cath placement.  Breast cancer.  PORTABLE CHEST - 1 VIEW  Comparison: 11/19/2012.  Findings: Portable semi upright AP view 1441 hours.  Right chest subclavian approach Port-A-Cath placed.   Acute angulation of the catheter tubing at the level of the right axilla.  Tip at the level of the cavoatrial junction.  Interval postoperative changes to the right chest wall with subcutaneous appearing drain in place.  No pneumothorax.  No pleural effusion or pulmonary edema.  Mildly increased bibasilar opacity compatible with atelectasis.  Cardiac size and mediastinal contours are within normal limits.  Visualized  tracheal air column is within normal limits.  IMPRESSION: 1.  Right subclavian approach chest Port-A-Cath in place.  Tip at the level of the cavoatrial junction.  Mildly angulated catheter tubing at the level of the axilla. 2.  Interval postoperative changes to the right chest wall. No pneumothorax. 3.  Mild dependent atelectasis.   Original Report Authenticated By: Erskine Speed, M.D.    Dg Fluoro Guide Cv Line-no Report  11/23/2012  CLINICAL DATA: bil breast cancer   FLOURO GUIDE CV LINE  Fluoroscopy was utilized by the requesting physician.  No radiographic  interpretation.     Mm Lt Plc Breast Loc Dev   1st Lesion  Inc Mammo Guide  11/23/2012  *RADIOLOGY REPORT*  Clinical Data:  Left breast cancer  NEEDLE LOCALIZATION WITH MAMMOGRAPHIC GUIDANCE AND SPECIMEN RADIOGRAPH  Comparison:  Previous exams.  Patient presents for needle localization prior to surgery.  I met with the patient and we discussed the procedure of needle localization including benefits and alternatives. We discussed the high likelihood of a successful procedure. We discussed the risks of the procedure, including infection, bleeding, tissue injury, and further surgery. Informed, written consent was given.  Using mammographic guidance, sterile technique, 2% lidocaine and a #9 modified Kopans needle, the mass and clip in the central aspect of the left breast was localized using a superior approach.  The films are marked for Dr. Derrell Lolling.  Specimen radiograph was performed at [facility], and confirms the mass and clip were present in  the tissue sample. The clip was located at the margin of the surgical specimen.  Dr.  Derrell Lolling states she had taken more tissue at the margin to clear the border.  The specimen is marked for pathology.  IMPRESSION: Needle localization of the left breast.  No apparent complications.   Original Report Authenticated By: Baird Lyons, M.D.    Mm Rt Plc Breast Loc Dev   1st Lesion  Inc Mammo Guide  11/23/2012  *RADIOLOGY REPORT*  Clinical Data:  Known right breast cancer  NEEDLE LOCALIZATION WITH MAMMOGRAPHIC GUIDANCE AND SPECIMEN RADIOGRAPH  Comparison:  Previous exams.  Patient presents for needle localization prior to surgery.  I met with the patient and we discussed the procedure of needle localization including benefits and alternatives. We discussed the high likelihood of a successful procedure. We discussed the risks of the procedure, including infection, bleeding, tissue injury, and further surgery. Informed, written consent was given.  Using mammographic guidance, sterile technique, 2% lidocaine and a #7 and #9 modified Kopans needles, the mass and nodularity in the upper outer quadrant of the right breast was bracketedusing a lateral approach.  The films are marked for Dr. Derrell Lolling.  Specimen radiograph was performed at Day Surgery, and confirms the mass and clip are present in the tissue sample.  The specimen is marked for pathology.  IMPRESSION: Needle localization of the right breast.  No apparent complications.   Original Report Authenticated By: Baird Lyons, M.D.     ASSESSMENT: 52 year old female with  #1 bilateral breast cancers triple negative on the right side ER positive on the left side. She is status post bilateral lumpectomies. Clinically she's doing well. She will receive Adriamycin Cytoxan given dose dense x4 cycles followed by Taxol carbo x12 cycles.  Rationale risks benefits and side effects were discussed.  Patient did develop febrile neutropenia after first cycle, should it recur, a dose  reduction will be considered.   #2 patient has had an echocardiogram Port-A-Cath placed and chemotherapy teaching  class.   PLAN:   #1 Doing well. Proceed with chemotherapy.  Talked to patient about the possibility of repeat febrile neutropenia after this cycle and that we may have to dose reduce if it happens again.    2. She will take the Dexamethasone 2 tabs po the day after chemo, then 1 tab BID x 2 days and we will see if that helps with her shakiness.    3. She will see [place] back in 1 week for interim labs and office visit.   All questions were answered. The patient knows to call the clinic with any problems, questions or concerns. We can certainly see the patient much sooner if necessary.  I spent 25 minutes counseling the patient face to face. The total time spent in the appointment was 30 minutes.  Cherie Ouch Lyn Hollingshead, NP Medical Oncology [facility] Phone: [phone] 01/13/2013, 12:51 PM

## 2013-01-13 NOTE — Patient Instructions (Addendum)
[facility] Discharge Instructions for Patients Receiving Chemotherapy  Today you received the following chemotherapy agents Adriamycin and Cytoxan.  To help prevent nausea and vomiting after your treatment, we encourage you to take your nausea medication as prescribed. If you develop nausea and vomiting that is not controlled by your nausea medication, call the clinic. If it is after clinic hours your family physician or the after hours number for the clinic or go to the Emergency Department.   BELOW ARE SYMPTOMS THAT SHOULD BE REPORTED IMMEDIATELY:  *FEVER GREATER THAN 100.5 F  *CHILLS WITH OR WITHOUT FEVER  NAUSEA AND VOMITING THAT IS NOT CONTROLLED WITH YOUR NAUSEA MEDICATION  *UNUSUAL SHORTNESS OF BREATH  *UNUSUAL BRUISING OR BLEEDING  TENDERNESS IN MOUTH AND THROAT WITH OR WITHOUT PRESENCE OF ULCERS  *URINARY PROBLEMS  *BOWEL PROBLEMS  UNUSUAL RASH Items with * indicate a potential emergency and should be followed up as soon as possible.  One of the nurses will contact you 24 hours after your treatment. Please let the nurse know about any problems that you may have experienced. Feel free to call the clinic you have any questions or concerns. The clinic phone number is [phone].      

## 2013-01-13 NOTE — Telephone Encounter (Signed)
appts made and printed. Pt is aware that tx will be added for 6/18. i emailed MW to add tx for 6/18 and to see if time for 6/4 tx need to be adjusted...Amy KitchenMarland Leonard

## 2013-01-13 NOTE — Telephone Encounter (Signed)
Per staff message and POF I have scheduled appts.  JMW  

## 2013-01-14 ENCOUNTER — Other Ambulatory Visit: Payer: BC Managed Care – PPO | Admitting: Lab

## 2013-01-14 ENCOUNTER — Encounter: Payer: Self-pay | Admitting: Genetic Counselor

## 2013-01-14 ENCOUNTER — Ambulatory Visit (HOSPITAL_BASED_OUTPATIENT_CLINIC_OR_DEPARTMENT_OTHER): Payer: BC Managed Care – PPO

## 2013-01-14 ENCOUNTER — Ambulatory Visit (HOSPITAL_BASED_OUTPATIENT_CLINIC_OR_DEPARTMENT_OTHER): Payer: BC Managed Care – PPO | Admitting: Genetic Counselor

## 2013-01-14 VITALS — BP 132/82 | HR 104 | Temp 98.0°F

## 2013-01-14 DIAGNOSIS — Z5189 Encounter for other specified aftercare: Secondary | ICD-10-CM

## 2013-01-14 DIAGNOSIS — C50919 Malignant neoplasm of unspecified site of unspecified female breast: Secondary | ICD-10-CM

## 2013-01-14 DIAGNOSIS — C50419 Malignant neoplasm of upper-outer quadrant of unspecified female breast: Secondary | ICD-10-CM

## 2013-01-14 DIAGNOSIS — IMO0002 Reserved for concepts with insufficient information to code with codable children: Secondary | ICD-10-CM

## 2013-01-14 DIAGNOSIS — C50411 Malignant neoplasm of upper-outer quadrant of right female breast: Secondary | ICD-10-CM

## 2013-01-14 MED ORDER — PEGFILGRASTIM INJECTION 6 MG/0.6ML
6.0000 mg | Freq: Once | SUBCUTANEOUS | Status: AC
  Administered 2013-01-14: 6 mg via SUBCUTANEOUS
  Filled 2013-01-14: qty 0.6

## 2013-01-14 NOTE — Progress Notes (Signed)
Dr.  Drue Second requested a consultation for genetic counseling and risk assessment for Amy Leonard, a 52 y.o. female, for discussion of her personal history of bilateral breast cancer. She presents to clinic today to discuss the possibility of a genetic predisposition to cancer, and to further clarify her risks, as well as her family members' risks for cancer.   HISTORY OF PRESENT ILLNESS: In 2014, at the age of 78, Amy Leonard was diagnosed with bilateral invasive ductal carcinoma. This was treated with a partial mastectomy and chemotherapy.  The tumor in the left breast is ER+/PR+/Her2- and the tumor in the right breast is triple negative.  The patients states that in the early 80s she lived in [place], [place] is part of [place]. For about 3 years.  She had not heard about the water contamination problem.    Past Medical History  Diagnosis Date  . Elevated WBC count     nl diff  . Chronic back pain     spinal stenosis  . Tobacco abuse   . Anxiety and depression   . HSV infection     recurrent (side and buttox)  . Migraine   . Glaucoma(365)     ?  Marland Kitchen Carpal tunnel syndrome   . HLD (hyperlipidemia)   . Folliculitis 05/05/2011  . Anxiety   . Depression   . 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 GERD (gastroesophageal reflux disease)   . Hypertension   . Breast cancer   . Wears dentures     top    Past Surgical History  Procedure Laterality Date  . Btl    . Epidural steroid injection  2008  . Colonoscopy    . Upper gi endoscopy    . Partial mastectomy with needle localization and axillary sentinel lymph node bx Bilateral 11/23/2012    Procedure: PARTIAL MASTECTOMY WITH NEEDLE LOCALIZATION AND AXILLARY SENTINEL LYMPH NODE BX;  Surgeon: Ernestene Mention, MD;  Location: [facility];  Service: General;  Laterality: Bilateral;  . Portacath placement Right 11/23/2012    Procedure: INSERTION PORT-A-CATH;  Surgeon: Ernestene Mention, MD;   Location: [facility];  Service: General;  Laterality: Right;    History  Substance Use Topics  . Smoking status: Current Every Day Smoker -- 0.50 packs/day for 33 years    Types: Cigarettes  . Smokeless tobacco: Never Used     Comment: 1 1/2 ppd -form given 10-07-12  . Alcohol Use: Yes     Comment: 4-5 beers fri and sat night-does this rarely    REPRODUCTIVE HISTORY AND PERSONAL RISK ASSESSMENT FACTORS: Menarche was at age 71.   Menopause at 50 Uterus Intact: Yes Ovaries Intact: Yes G3P3A0 , first live birth at age 68  She has not previously undergone treatment for infertility.   OCP use for less than 10 yers   She has not used HRT in the past.    FAMILY HISTORY:  We obtained a detailed, 4-generation family history.  Significant diagnoses are listed below: Family History  Problem Relation Age of Onset  . Stroke Maternal Grandmother   . Diabetes Maternal Grandmother   . Diabetes Mother   . Hypertension Mother   . Leukemia Mother   . Obesity Mother   . Depression Sister   . Bipolar disorder Son     Bipolar / oppositional defiant  . Alcohol abuse Sister   . Drug abuse Sister   . Alcohol abuse Sister   .  Drug abuse Sister   . Colon cancer Neg Hx   . Alcohol abuse Brother   . Drug abuse Brother     Patient's maternal ancestors are of Native [place] and [place] descent, and paternal ancestors are of unknown descent. There is no reported Ashkenazi Jewish ancestry. There is no known consanguinity.  GENETIC COUNSELING ASSESSMENT: Amy Leonard is a 52 y.o. y.o. female with a personal history of bilateral breast cancer, one of which is triple negative which somewhat suggestive of a hereditary breast and ovarian cancer syndrome and predisposition to cancer. We, therefore, discussed and recommended the following at today's visit.   DISCUSSION: We reviewed the characteristics, features and inheritance patterns of hereditary cancer syndromes. We also  discussed genetic testing, including the appropriate family members to test, the process of testing, insurance coverage and turn-around-time for results. We recommended Amy Leonard pursue genetic testing for BRCA1/2 at [facility].   PLAN: After considering the risks, benefits, and limitations, Amy Leonard provided informed consent to pursue genetic testing and the blood sample will be sent to [facility] for analysis of the BRCA genes. We discussed the implications of a positive, negative and/ or variant of uncertain significance genetic test result. Results should be available within approximately 2-3 weeks' time, at which point they will be disclosed by telephone to Amy Leonard, as will any additional recommendations warranted by these results. Amy Leonard will receive a summary of her genetic counseling visit and a copy of her results once available. This information will also be available in Epic. We encouraged Amy Leonard to remain in contact with cancer genetics annually so that we can continuously update the family history and inform her of any changes in cancer genetics and testing that may be of benefit for her family. Coriann Brouhard Dona's questions were answered to her satisfaction today. Our contact information was provided should additional questions or concerns arise.  Per the patient's request, we will contact her by telephone to discuss these results. A follow up genetic counseling visit will be scheduled if indicated.  The patient was seen for a total of 60 minutes, greater than 50% of which was spent face-to-face counseling.  This plan is being carried out per Dr. Feliz Beam recommendations.  This note will also be sent to the referring provider via the electronic medical record. The patient will be supplied with a summary of this genetic counseling discussion as well as educational information on the discussed hereditary cancer syndromes following the  conclusion of their visit.   Patient was discussed with Dr. Drue Second.  _______________________________________________________________________ For Office Staff:  Number of people involved in session: 2 Was an Intern/ student involved with case: no }

## 2013-01-15 ENCOUNTER — Encounter: Payer: Self-pay | Admitting: Oncology

## 2013-01-15 NOTE — Progress Notes (Signed)
Put [employer] disability form on nurse's desk. °

## 2013-01-20 ENCOUNTER — Ambulatory Visit (HOSPITAL_BASED_OUTPATIENT_CLINIC_OR_DEPARTMENT_OTHER)
Admission: RE | Admit: 2013-01-20 | Discharge: 2013-01-20 | Disposition: A | Discharge status: Home / Self Care | Payer: BC Managed Care – PPO | Source: Ambulatory Visit | Attending: Internal Medicine | Admitting: Internal Medicine

## 2013-01-20 ENCOUNTER — Encounter: Payer: Self-pay | Admitting: Oncology

## 2013-01-20 ENCOUNTER — Ambulatory Visit (HOSPITAL_COMMUNITY)
Admission: RE | Admit: 2013-01-20 | Discharge: 2013-01-20 | Disposition: A | Discharge status: Home / Self Care | Payer: BC Managed Care – PPO | Source: Ambulatory Visit | Attending: Internal Medicine | Admitting: Internal Medicine

## 2013-01-20 ENCOUNTER — Ambulatory Visit (HOSPITAL_BASED_OUTPATIENT_CLINIC_OR_DEPARTMENT_OTHER): Payer: BC Managed Care – PPO | Admitting: Oncology

## 2013-01-20 ENCOUNTER — Other Ambulatory Visit (HOSPITAL_BASED_OUTPATIENT_CLINIC_OR_DEPARTMENT_OTHER): Payer: BC Managed Care – PPO | Admitting: Lab

## 2013-01-20 ENCOUNTER — Encounter (HOSPITAL_COMMUNITY): Payer: Self-pay

## 2013-01-20 VITALS — BP 104/70 | HR 109 | Temp 98.7°F | Resp 20 | Ht 63.0 in | Wt 192.9 lb

## 2013-01-20 VITALS — BP 110/76 | HR 96 | Wt 191.1 lb

## 2013-01-20 DIAGNOSIS — C50919 Malignant neoplasm of unspecified site of unspecified female breast: Secondary | ICD-10-CM

## 2013-01-20 DIAGNOSIS — I079 Rheumatic tricuspid valve disease, unspecified: Secondary | ICD-10-CM | POA: Insufficient documentation

## 2013-01-20 DIAGNOSIS — C50912 Malignant neoplasm of unspecified site of left female breast: Secondary | ICD-10-CM

## 2013-01-20 DIAGNOSIS — D709 Neutropenia, unspecified: Secondary | ICD-10-CM

## 2013-01-20 DIAGNOSIS — M7989 Other specified soft tissue disorders: Secondary | ICD-10-CM | POA: Insufficient documentation

## 2013-01-20 DIAGNOSIS — E785 Hyperlipidemia, unspecified: Secondary | ICD-10-CM | POA: Insufficient documentation

## 2013-01-20 DIAGNOSIS — C50419 Malignant neoplasm of upper-outer quadrant of unspecified female breast: Secondary | ICD-10-CM

## 2013-01-20 DIAGNOSIS — C50411 Malignant neoplasm of upper-outer quadrant of right female breast: Secondary | ICD-10-CM

## 2013-01-20 DIAGNOSIS — Z09 Encounter for follow-up examination after completed treatment for conditions other than malignant neoplasm: Secondary | ICD-10-CM

## 2013-01-20 DIAGNOSIS — I10 Essential (primary) hypertension: Secondary | ICD-10-CM | POA: Insufficient documentation

## 2013-01-20 DIAGNOSIS — R5381 Other malaise: Secondary | ICD-10-CM

## 2013-01-20 DIAGNOSIS — R5383 Other fatigue: Secondary | ICD-10-CM

## 2013-01-20 DIAGNOSIS — C50112 Malignant neoplasm of central portion of left female breast: Secondary | ICD-10-CM

## 2013-01-20 LAB — COMPREHENSIVE METABOLIC PANEL (CC13)
ALT: 16 U/L (ref 0–55)
AST: 7 U/L (ref 5–34)
Albumin: 3.1 g/dL — ABNORMAL LOW (ref 3.5–5.0)
BUN: 12.6 mg/dL (ref 7.0–26.0)
Calcium: 8.8 mg/dL (ref 8.4–10.4)
Chloride: 103 mEq/L (ref 98–107)
Potassium: 3.9 mEq/L (ref 3.5–5.1)
Total Protein: 6 g/dL — ABNORMAL LOW (ref 6.4–8.3)

## 2013-01-20 LAB — CBC WITH DIFFERENTIAL/PLATELET
BASO%: 0 % (ref 0.0–2.0)
HCT: 32.2 % — ABNORMAL LOW (ref 34.8–46.6)
HGB: 11 g/dL — ABNORMAL LOW (ref 11.6–15.9)
MCHC: 34.2 g/dL (ref 31.5–36.0)
MONO#: 0.1 10*3/uL (ref 0.1–0.9)
NEUT%: 1.2 % — ABNORMAL LOW (ref 38.4–76.8)
WBC: 0.8 10*3/uL — CL (ref 3.9–10.3)
lymph#: 0.7 10*3/uL — ABNORMAL LOW (ref 0.9–3.3)

## 2013-01-20 MED ORDER — CIPROFLOXACIN HCL 500 MG PO TABS: 500.0000 mg | ORAL_TABLET | Freq: Two times a day (BID) | ORAL | Status: DC

## 2013-01-20 NOTE — Progress Notes (Signed)
Faxed disability form to [employer] @ [phone] °

## 2013-01-20 NOTE — Progress Notes (Signed)
Patient ID: Amy Leonard, female   DOB: [birth year], 52 y.o.   MRN: [id number]  General Surgeon: Dr Derrell Lolling Oncologist: Dr Welton Flakes PCP: Dr Lucretia Roers  HPI: Ms Amy Leonard is a 52 year old with a history of bilateral invasive ductal carcinoma breast cancer( Left ER positive PR positive HER-2/neu negative, R Triple-negative breast cancer), depression, HTN, hyperlipidemia,  and current tobacco.  She will receive adjuvant chemotherapy with Adriamycin and Cytoxan given dose dense 4 cycles followed by weekly Taxol and carboplatinum for a total of 12 weeks starting Dec 30, 2012. She will also need need radiation therapy adjuvantly.   ECHO 11/26/11 EF 55-60% lateral s' 11.5 poor windows which will be difficult to follow subsequent lateral S'.  ECHO 01/20/13 EF 55-60% lateral S' 11.6  She returns for follow up. She started chemo May 7. Complains of fatigue. Denies SOB/PND/Orthopnea.     Past Medical History  Diagnosis Date  . Elevated WBC count     nl diff  . Chronic back pain     spinal stenosis  . Tobacco abuse   . Anxiety and depression   . HSV infection     recurrent (side and buttox)  . Migraine   . Glaucoma     ?  . Carpal tunnel syndrome   . HLD (hyperlipidemia)   . Folliculitis 05/05/2011  . Anxiety   . Depression   . 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 GERD (gastroesophageal reflux disease)   . Hypertension   . Breast cancer   . Wears dentures     top    Current Outpatient Prescriptions  Medication Sig Dispense Refill  . Acetaminophen (TYLENOL ARTHRITIS PAIN PO) Take 2 tablets by mouth as needed (pain).       Marland Kitchen acyclovir (ZOVIRAX) 5 % ointment Apply 1 application topically as needed (flare-up).       Marland Kitchen aspirin 81 MG chewable tablet Chew 81 mg by mouth daily.      . ATIVAN 0.5 MG tablet Take 1 tablet (0.5 mg total) by mouth every 6 (six) hours as needed (Nausea or vomiting).  45 tablet  0  . atorvastatin (LIPITOR) 10 MG tablet Take 1 tablet (10 mg total) by mouth daily.  90 tablet   1  . calcium carbonate (TUMS - DOSED IN MG ELEMENTAL CALCIUM) 500 MG chewable tablet Chew by mouth as directed.        . CHANTIX 0.5 MG tablet Take 0.5 mg by mouth daily.       . ciprofloxacin (CIPRO) 500 MG tablet Take 1 tablet (500 mg total) by mouth 2 (two) times daily.  14 tablet  0  . COMPRO 25 MG suppository       . cyclobenzaprine (FLEXERIL) 10 MG tablet Take 10 mg by mouth as needed (muscle spasm).       Marland Kitchen dexamethasone (DECADRON) 4 MG tablet Take 2 tablets by mouth once a day on the day after chemotherapy and then take 2 tablets two times a day for 2 days. Take with food.  30 tablet  1  . fluconazole (DIFLUCAN) 100 MG tablet Take 2 tablets (200 mg total) by mouth daily.  14 tablet  0  . lidocaine-prilocaine (EMLA) cream Apply topically as needed.  30 g  8  . losartan (COZAAR) 50 MG tablet Take 1 tablet (50 mg total) by mouth daily.  90 tablet  2  . nystatin (MYCOSTATIN) 100000 UNIT/ML suspension Take 5 mLs (500,000 Units total) by mouth 2 (two) times daily.  60 mL  3  . omeprazole (PRILOSEC) 40 MG capsule Take 1 capsule (40 mg total) by mouth 2 (two) times daily.  180 capsule  3  . ondansetron (ZOFRAN) 8 MG tablet Take 1 tablet (8 mg total) by mouth 2 (two) times daily as needed. Take two times a day as needed for nausea or vomiting starting on the third day after chemotherapy.  30 tablet  1  . prochlorperazine (COMPAZINE) 10 MG tablet Take 1 tablet (10 mg total) by mouth every 6 (six) hours as needed (Nausea or vomiting).  30 tablet  1  . simethicone (MYLICON) 80 MG chewable tablet Chew 1 tablet (80 mg total) by mouth every 8 (eight) hours as needed for flatulence.  60 tablet  3   No current facility-administered medications for this encounter.     Allergies  Allergen Reactions  . Bupropion Hcl Nausea Only and Other (See Comments)     GI, sleepy  . Chlorhexidine Gluconate Itching and Rash  . Hydrocod Polst-Cpm Polst Er Nausea And Vomiting    vomiting  . Lisinopril Other (See  Comments)    Leg cramps    . Naproxen Sodium Other (See Comments)    does not tolerate    History   Social History  . Marital Status: Divorced    Spouse Name: N/A    Number of Children: N/A  . Years of Education: N/A   Occupational History  . Full time and PT job    Social History Main Topics  . Smoking status: Current Every Day Smoker -- 0.50 packs/day for 33 years    Types: Cigarettes  . Smokeless tobacco: Never Used     Comment: 1 1/2 ppd -form given 10-07-12  . Alcohol Use: Yes     Comment: 4-5 beers fri and sat night-does this rarely  . Drug Use: No  . Sexually Active: Not Currently   Other Topics Concern  . Not on file   Social History Narrative   Separated; full time and part time job; no regular exercise.              Family History  Problem Relation Age of Onset  . Stroke Maternal Grandmother   . Diabetes Maternal Grandmother   . Diabetes Mother   . Hypertension Mother   . Leukemia Mother   . Obesity Mother   . Depression Sister   . Bipolar disorder Son     Bipolar / oppositional defiant  . Alcohol abuse Sister   . Drug abuse Sister   . Alcohol abuse Sister   . Drug abuse Sister   . Colon cancer Neg Hx   . Alcohol abuse Brother   . Drug abuse Brother     PHYSICAL EXAM: Filed Vitals:   01/20/13 1000  BP: 110/76  Pulse: 96   General:  Well appearing. No respiratory difficulty Daughter present HEENT: normal Neck: supple. no JVD. Carotids 2+ bilat; no bruits. No lymphadenopathy or thryomegaly appreciated. Cor: PMI nondisplaced. Regular rate & rhythm. No rubs, gallops or murmurs. Lungs: clear Abdomen: soft, nontender, nondistended. No hepatosplenomegaly. No bruits or masses. Good bowel sounds. Extremities: no cyanosis, clubbing, rash, edema Neuro: alert & oriented x 3, cranial nerves grossly intact. moves all 4 extremities w/o difficulty. Affect pleasant.  No results found for this or any previous visit (from the past 24 hour(s)). No  results found.

## 2013-01-20 NOTE — Progress Notes (Signed)
  Echocardiogram 2D Echocardiogram has been performed.  Cathie Beams 01/20/2013, 9:32 AM

## 2013-01-20 NOTE — Assessment & Plan Note (Addendum)
Dr Gala Romney reviewed and discussed ECHO. EF stable. Follow up in 2 months with an ECHO while she is receiving adriamycin.   Patient seen and examined with Tonye Becket, NP. We discussed all aspects of the encounter. I agree with the assessment and plan as stated above. 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 I reviewed echo images personally today. All parameters stable. Continue chemo.F/u in 2 months while getting adriamycin.

## 2013-01-20 NOTE — Patient Instructions (Addendum)
Follow up in 2 months with an ECHO  

## 2013-01-20 NOTE — Patient Instructions (Addendum)
You are doing well but your counts are down (white blood count )  Continue good oral care, use the natural dentist mouth rinse (vitamin store)  Begin cipro 500 mg twice a day for the next 7 days  We will see you back in 1 week for next cycle of chemotherapy  Lab Results  Component Value Date   WBC 0.8* 01/20/2013   HGB 11.0* 01/20/2013   HCT 32.2* 01/20/2013   MCV 91.5 01/20/2013   PLT 146 01/20/2013   ANC: 0

## 2013-01-26 ENCOUNTER — Telehealth: Payer: Self-pay | Admitting: Genetic Counselor

## 2013-01-26 NOTE — Telephone Encounter (Signed)
Revealed negative BRCA testing. 

## 2013-01-27 ENCOUNTER — Ambulatory Visit (HOSPITAL_BASED_OUTPATIENT_CLINIC_OR_DEPARTMENT_OTHER): Payer: BC Managed Care – PPO | Admitting: Adult Health

## 2013-01-27 ENCOUNTER — Other Ambulatory Visit (HOSPITAL_BASED_OUTPATIENT_CLINIC_OR_DEPARTMENT_OTHER): Payer: BC Managed Care – PPO | Admitting: Lab

## 2013-01-27 ENCOUNTER — Encounter: Payer: Self-pay | Admitting: Genetic Counselor

## 2013-01-27 ENCOUNTER — Ambulatory Visit (HOSPITAL_BASED_OUTPATIENT_CLINIC_OR_DEPARTMENT_OTHER): Payer: BC Managed Care – PPO

## 2013-01-27 ENCOUNTER — Encounter: Payer: Self-pay | Admitting: Adult Health

## 2013-01-27 VITALS — BP 132/81 | HR 99 | Temp 98.4°F | Resp 20 | Ht 63.0 in | Wt 194.5 lb

## 2013-01-27 DIAGNOSIS — C50419 Malignant neoplasm of upper-outer quadrant of unspecified female breast: Secondary | ICD-10-CM

## 2013-01-27 DIAGNOSIS — C50919 Malignant neoplasm of unspecified site of unspecified female breast: Secondary | ICD-10-CM

## 2013-01-27 DIAGNOSIS — C50411 Malignant neoplasm of upper-outer quadrant of right female breast: Secondary | ICD-10-CM

## 2013-01-27 DIAGNOSIS — Z171 Estrogen receptor negative status [ER-]: Secondary | ICD-10-CM

## 2013-01-27 DIAGNOSIS — Z5111 Encounter for antineoplastic chemotherapy: Secondary | ICD-10-CM

## 2013-01-27 DIAGNOSIS — Z170 Estrogen receptor positive status [ER+]: Secondary | ICD-10-CM

## 2013-01-27 LAB — CBC WITH DIFFERENTIAL/PLATELET
Basophils Absolute: 0.1 10*3/uL (ref 0.0–0.1)
HGB: 11 g/dL — ABNORMAL LOW (ref 11.6–15.9)
LYMPH%: 18.8 % (ref 14.0–49.7)
MCH: 31.3 pg (ref 25.1–34.0)
MCHC: 33.7 g/dL (ref 31.5–36.0)
MONO%: 12.6 % (ref 0.0–14.0)
NEUT%: 67.7 % (ref 38.4–76.8)
RBC: 3.52 10*6/uL — ABNORMAL LOW (ref 3.70–5.45)
RDW: 13.5 % (ref 11.2–14.5)
lymph#: 1.6 10*3/uL (ref 0.9–3.3)

## 2013-01-27 LAB — COMPREHENSIVE METABOLIC PANEL (CC13)
ALT: 16 U/L (ref 0–55)
BUN: 10.5 mg/dL (ref 7.0–26.0)
CO2: 24 mEq/L (ref 22–29)
Calcium: 8.4 mg/dL (ref 8.4–10.4)
Chloride: 107 mEq/L (ref 98–107)
Creatinine: 0.7 mg/dL (ref 0.6–1.1)
Glucose: 85 mg/dl (ref 70–99)

## 2013-01-27 MED ORDER — DOXORUBICIN HCL CHEMO IV INJECTION 2 MG/ML
60.0000 mg/m2 | Freq: Once | INTRAVENOUS | Status: AC
  Administered 2013-01-27: 120 mg via INTRAVENOUS
  Filled 2013-01-27: qty 60

## 2013-01-27 MED ORDER — SODIUM CHLORIDE 0.9 % IJ SOLN
10.0000 mL | INTRAMUSCULAR | Status: DC | PRN
  Administered 2013-01-27: 10 mL
  Filled 2013-01-27: qty 10

## 2013-01-27 MED ORDER — SODIUM CHLORIDE 0.9 % IV SOLN
150.0000 mg | Freq: Once | INTRAVENOUS | Status: AC
  Administered 2013-01-27: 150 mg via INTRAVENOUS
  Filled 2013-01-27: qty 5

## 2013-01-27 MED ORDER — LORAZEPAM 2 MG/ML IJ SOLN: 0.5000 mg | Freq: Once | INTRAMUSCULAR | Status: DC

## 2013-01-27 MED ORDER — SODIUM CHLORIDE 0.9 % IV SOLN
Freq: Once | INTRAVENOUS | Status: AC
  Administered 2013-01-27: 13:00:00 via INTRAVENOUS

## 2013-01-27 MED ORDER — DEXAMETHASONE SODIUM PHOSPHATE 20 MG/5ML IJ SOLN
12.0000 mg | Freq: Once | INTRAMUSCULAR | Status: AC
  Administered 2013-01-27: 12 mg via INTRAVENOUS

## 2013-01-27 MED ORDER — PALONOSETRON HCL INJECTION 0.25 MG/5ML
0.2500 mg | Freq: Once | INTRAVENOUS | Status: AC
  Administered 2013-01-27: 0.25 mg via INTRAVENOUS

## 2013-01-27 MED ORDER — NYSTATIN 100000 UNIT/ML MT SUSP: 5.0000 mL | Freq: Two times a day (BID) | OROMUCOSAL | Status: DC

## 2013-01-27 MED ORDER — CYCLOPHOSPHAMIDE CHEMO INJECTION 1 GM
600.0000 mg/m2 | Freq: Once | INTRAMUSCULAR | Status: AC
  Administered 2013-01-27: 1200 mg via INTRAVENOUS
  Filled 2013-01-27: qty 60

## 2013-01-27 MED ORDER — HEPARIN SOD (PORK) LOCK FLUSH 100 UNIT/ML IV SOLN
500.0000 [IU] | Freq: Once | INTRAVENOUS | Status: AC | PRN
  Administered 2013-01-27: 500 [IU]
  Filled 2013-01-27: qty 5

## 2013-01-27 NOTE — Patient Instructions (Addendum)
Doing well.  Proceed with chemotherapy.  Please call us if you have any questions or concerns.    

## 2013-01-27 NOTE — Progress Notes (Signed)
OFFICE PROGRESS NOTE  CC  [name], MD [address] [place] [address], [place] [address] Dr. Claud Kelp  Dr. Antony Blackbird  DIAGNOSIS: 52 year old female with new diagnosis of bilateral breast cancers.   STAGE:  Cancer of upper-outer quadrant of female breast  Primary site: Breast (Bilateral)  Staging method: AJCC 7th Edition  Clinical: Stage IA (T1c, N0, cM0)  Summary: Stage IA (T1c, N0, cM0)   PRIOR THERAPY: #1Patient recently underwent screening mammograms and she was found to have bilateral breast abnormalities.diagnostic mammogram showed in the right breast an ill-defined 2 cm mass with a few associated well defined microcalcifications over the upper outer quadrant. Spot compression images of the left breast demonstrated an ill defined spiculated 8 mm mass centrally. Ultrasound showed irregular bordered heterogeneous hypoechoic solid mass at the 10:00 position of the right breast 6 cm from the nipple measuring 1.1 x 1.3 x 1.4 cm. Also in this location was an adjacent smaller irregular hypoechoic mass measuring 3 x 5 x 6 mm. The smaller mass was 1 cm from the large more irregular mass. Ultrasound of the axilla demonstrated single lymph node with thickened cortex. Ultrasound of the left breast demonstrated an ill-defined hypoechoic mass with distal acoustic shadowing 7 to 8:00 position 3 cm from the nipple located deep and measuring 5 x 8 x 8 mm ultrasound of the left axilla showed normal appearing lymph nodes.   #2Patient went on to have needle core biopsies performed of both of the masses. 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 The tumor was ER positive PR positive with a Ki-67 of 9% the second tumor was ER negative PR negative HER-2/neu negative with Ki-6792% and elevated.  #3 Patient had MRIs of the breasts performed. There was  noted to be 1.7 cm irregular enhancing mass located within the upper-outer quadrant of the right breast with 3 adjacent worrisome enhancing satellite nodules in aggregate the nodules and mass measures 3.8 cm. In the left breast 8 mm irregular enhancing mass located within the central left breast was noted. No evidence of axillary or internal mammary adenopathy.  #4patient is status post bilateral lumpectomies. The right breast revealed a 1.5 cm grade 3 invasive ductal carcinoma ER/PR negative HER-2/neu negative Ki-67 92%. Sentinel node was negative. Left breast showed 1.0 cm grade 3 invasive ductal carcinoma ER/PR positive HER-2/neu negative sentinel node was negative.  #5 patient is to begin adjuvant chemotherapy consisting of Adriamycin Cytoxan every 2 weeks for a total of 4 cycles 12/30/12. This would then be followed by Taxol and carboplatinum weekly for 12 weeks. Patient will need adjuvant radiation therapy. Because one of the tumor is ER positive she will also receive antiestrogen therapy.  CURRENT THERAPY:cycle 3 day 1 of adjuvant dose dense AC  INTERVAL HISTORY: Amy Leonard 52 y.o. female returns for followup visit prior to cycle three of her adjuvant Adriamycin/Cytoxan.  Her shakiness due to the steroids is improved.  She also noticed that it was worse after the Compazine, and she has not taken compazine since then.  She is fatigued, and it has improved since she started taking Super b complex.  She denies fevers, chills, nausea, vomiting, constipation, diarrhea, numbness, tingling, skin changes, or any further concerns.   MEDICAL HISTORY: Past Medical History  Diagnosis Date  . Elevated WBC count     nl diff  .  Chronic back pain     spinal stenosis  . Tobacco abuse   . Anxiety and depression   . HSV infection     recurrent (side and buttox)  . Migraine   . Glaucoma     ?  . Carpal tunnel syndrome   . HLD (hyperlipidemia)   . Folliculitis 05/05/2011  . Anxiety   . Depression    . 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 GERD (gastroesophageal reflux disease)   . Hypertension   . Breast cancer   . Wears dentures     top    ALLERGIES:  is allergic to bupropion hcl; chlorhexidine gluconate; hydrocod polst-cpm polst er; lisinopril; and naproxen sodium.  MEDICATIONS:  Current Outpatient Prescriptions  Medication Sig Dispense Refill  . Acetaminophen (TYLENOL ARTHRITIS PAIN PO) Take 2 tablets by mouth as needed (pain).       Marland Kitchen acyclovir (ZOVIRAX) 5 % ointment Apply 1 application topically as needed (flare-up).       Marland Kitchen aspirin 81 MG chewable tablet Chew 81 mg by mouth daily.      . ATIVAN 0.5 MG tablet Take 1 tablet (0.5 mg total) by mouth every 6 (six) hours as needed (Nausea or vomiting).  45 tablet  0  . atorvastatin (LIPITOR) 10 MG tablet Take 1 tablet (10 mg total) by mouth daily.  90 tablet  1  . calcium carbonate (TUMS - DOSED IN MG ELEMENTAL CALCIUM) 500 MG chewable tablet Chew by mouth as directed.        . ciprofloxacin (CIPRO) 500 MG tablet Take 1 tablet (500 mg total) by mouth 2 (two) times daily.  14 tablet  3  . COMPRO 25 MG suppository       . cyclobenzaprine (FLEXERIL) 10 MG tablet Take 10 mg by mouth as needed (muscle spasm).       Marland Kitchen dexamethasone (DECADRON) 4 MG tablet Take 2 tablets by mouth once a day on the day after chemotherapy and then take 2 tablets two times a day for 2 days. Take with food.  30 tablet  1  . fluconazole (DIFLUCAN) 100 MG tablet Take 2 tablets (200 mg total) by mouth daily.  14 tablet  0  . lidocaine-prilocaine (EMLA) cream Apply topically as needed.  30 g  8  . losartan (COZAAR) 50 MG tablet Take 1 tablet (50 mg total) by mouth daily.  90 tablet  2  . nystatin (MYCOSTATIN) 100000 UNIT/ML suspension Take 5 mLs (500,000 Units total) by mouth 2 (two) times daily.  60 mL  3  . omeprazole (PRILOSEC) 40 MG capsule Take 1 capsule (40 mg total) by mouth 2 (two) times daily.  180 capsule  3  . ondansetron (ZOFRAN) 8 MG tablet Take 1  tablet (8 mg total) by mouth 2 (two) times daily as needed. Take two times a day as needed for nausea or vomiting starting on the third day after chemotherapy.  30 tablet  1  . prochlorperazine (COMPAZINE) 10 MG tablet Take 1 tablet (10 mg total) by mouth every 6 (six) hours as needed (Nausea or vomiting).  30 tablet  1  . simethicone (MYLICON) 80 MG chewable tablet Chew 1 tablet (80 mg total) by mouth every 8 (eight) hours as needed for flatulence.  60 tablet  3   No current facility-administered medications for this visit.    SURGICAL HISTORY:  Past Surgical History  Procedure Laterality Date  . Btl    . Epidural steroid injection  2008  .  Colonoscopy    . Upper gi endoscopy    . Partial mastectomy with needle localization and axillary sentinel lymph node bx Bilateral 11/23/2012    Procedure: PARTIAL MASTECTOMY WITH NEEDLE LOCALIZATION AND AXILLARY SENTINEL LYMPH NODE BX;  Surgeon: Ernestene Mention, MD;  Location: [facility];  Service: General;  Laterality: Bilateral;  . Portacath placement Right 11/23/2012    Procedure: INSERTION PORT-A-CATH;  Surgeon: Ernestene Mention, MD;  Location: [facility];  Service: General;  Laterality: Right;    REVIEW OF SYSTEMS:  Pertinent items are noted in HPI.   PHYSICAL EXAMINATION: Blood pressure 132/81, pulse 99, temperature 98.4 F (36.9 C), temperature source Oral, resp. rate 20, height 5\' 3"  (1.6 m), weight 194 lb 8 oz (88.225 kg). Body mass index is 34.46 kg/(m^2). General: Patient is a well appearing female in no acute distress HEENT: PERRLA, sclerae anicteric no conjunctival pallor, MMM Neck: supple, no palpable adenopathy Lungs: clear to auscultation bilaterally, no wheezes, rhonchi, or rales Cardiovascular: regular rate rhythm, S1, S2, no murmurs, rubs or gallops Abdomen: Soft, non-tender, non-distended, normoactive bowel sounds, no HSM Extremities: warm and well perfused, no clubbing, cyanosis, or edema Skin: No  rashes or lesions Neuro: Non-focal Breasts: Bilateral lumpectomy sites without nodularity, no masses or skin changes in either breast. ECOG PERFORMANCE STATUS: 0 - Asymptomatic  LABORATORY DATA: Lab Results  Component Value Date   WBC 8.6 01/27/2013   HGB 11.0* 01/27/2013   HCT 32.6* 01/27/2013   MCV 92.6 01/27/2013   PLT 262 01/27/2013      Chemistry      Component Value Date/Time   NA 137 01/20/2013 1234   NA 135 01/08/2013 0555   K 3.9 01/20/2013 1234   K 4.0 01/08/2013 0555   CL 103 01/20/2013 1234   CL 100 01/08/2013 0555   CO2 26 01/20/2013 1234   CO2 28 01/08/2013 0555   BUN 12.6 01/20/2013 1234   BUN 10 01/08/2013 0555   CREATININE 0.7 01/20/2013 1234   CREATININE 0.62 01/08/2013 0555      Component Value Date/Time   CALCIUM 8.8 01/20/2013 1234   CALCIUM 9.1 01/08/2013 0555   ALKPHOS 73 01/20/2013 1234   ALKPHOS 67 01/05/2013 2245   AST 7 01/20/2013 1234   AST 10 01/05/2013 2245   ALT 16 01/20/2013 1234   ALT 15 01/05/2013 2245   BILITOT 0.68 01/20/2013 1234   BILITOT 0.5 01/05/2013 2245    ADDITIONAL INFORMATION: 1. CHROMOGENIC IN-SITU HYBRIDIZATION Results: HER-2/NEU BY CISH - NO AMPLIFICATION OF HER-2 DETECTED. RESULT RATIO OF HER2: CEP 17 SIGNALS 1.00 AVERAGE HER2 COPY NUMBER PER CELL 1.55 REFERENCE RANGE NEGATIVE HER2/Chr17 Ratio <2.0 and Average HER2 copy number <4.0 EQUIVOCAL HER2/Chr17 Ratio <2.0 and Average HER2 copy number 4.0 and <6.0 POSITIVE HER2/Chr17 Ratio >=2.0 and/or Average HER2 copy number >=6.0 Pecola Leisure MD Pathologist, Electronic Signature ( Signed 12/01/2012) 3. CHROMOGENIC IN-SITU HYBRIDIZATION Results: HER-2/NEU BY CISH - NO AMPLIFICATION OF HER-2 DETECTED. RESULT RATIO OF HER2: CEP 17 SIGNALS 1.22 AVERAGE HER2 COPY NUMBER PER CELL 1.40 REFERENCE RANGE NEGATIVE HER2/Chr17 Ratio <2.0 and Average HER2 copy number <4.0 EQUIVOCAL HER2/Chr17 Ratio <2.0 and Average HER2 copy number 4.0 and <6.0 POSITIVE HER2/Chr17 Ratio >=2.0 and/or Average HER2 copy  number >=6.0 1 of 5 FINAL for Amy Leonard, Amy Leonard ([id number]) ADDITIONAL INFORMATION:(continued) Pecola Leisure MD Pathologist, Electronic Signature ( Signed 12/01/2012) FINAL DIAGNOSIS Diagnosis 1. Breast, lumpectomy, Right - INVASIVE DUCTAL CARCINOMA, GRADE III, SEE COMMENT. - INVASIVE CARCINOMA IS 0.9  CM FROM NEAREST MARGIN (INFERIOR). - NO LYMPHOVASCULAR INVASION IDENTIFIED. - HIGH GRADE DUCTAL CARCINOMA IN SITU - IN SITU CARCINOMA IS 1 MM FROM THE NEAREST LATERAL MARGIN - SEE TUMOR SYNOPTIC TEMPLATE BELOW. 2. Lymph nodes, regional resection, Right axilla - ELEVEN LYMPH NODES, NEGATIVE FOR TUMOR (0/11). 3. Breast, lumpectomy, Left - INVASIVE DUCTAL CARCINOMA, GRADE I, SEE COMMENT. - LYMPHOVASCULAR INVASION IDENTIFIED - INVASIVE CARCINOMA IS BROADLY PRESENT AT POSTERIOR MARGIN - DUCTAL CARCINOMA IN SITU, GRADE I. - IN SITU CARCINOMA IS FOCALLY PRESENT AT POSTERIOR MARGIN - SEE TUMOR SYNOPTIC TEMPLATE BELOW. 4. Breast, excision, Left - BENIGN BREAST TISSUE, SEE COMMENT. - NEGATIVE FOR ATYPIA OR MALIGNANCY. - SURGICAL MARGIN, NEGATIVE FOR ATYPIA OR MALIGNANCY. 5. Lymph node, sentinel, biopsy, Left axillary - ONE LYMPH NODE, POSITIVE FOR METASTATIC MAMMARY CARCINOMA (1/1). Microscopic Comment 1. BREAST, INVASIVE TUMOR, WITH LYMPH NODE SAMPLING Specimen, including laterality: Right breast Procedure: Lumpectomy Grade: III of III Tubule formation: 3 Nuclear pleomorphism: 3 Mitotic: 3 Tumor size (gross measurement: 1.5 cm Margins: Invasive, distance to closest margin: 0.9 cm In-situ, distance to closest margin: 0.1 cm If margin positive, focally or broadly: N/A Lymphovascular invasion: Absent Ductal carcinoma in situ: Present Grade: III of III Extensive intraductal component: Absent Lobular neoplasia: Absent Tumor focality: Multifocal Treatment effect: None 2 of 5 FINAL for Amy Leonard, Amy Leonard ([id number]) Microscopic Comment(continued) If present, treatment effect in  breast tissue, lymph nodes or both: N/A Extent of tumor: Skin: N/A Nipple: N/A Skeletal muscle: N/A Lymph nodes: # examined: 11 (Part 2) Lymph nodes with metastasis: 0 Breast prognostic profile: Estrogen receptor: Repeated, previous study demonstrated 0% positivity ([id number]) Progesterone receptor: Repeated, previous study demonstrated 0% positivity ([id number]) Her 2 neu: Repeated, previous study demonstrated no amplification (1.38) ([id number]) Ki-67: Not repeated, previous study demonstrated 92% proliferation rate ([id number]) Non-neoplastic breast: Previous biopsy site, fibrocystic change, sclerosing adenosis and benign calcifications in benign ducts and lobules. TNM: pT1c, pN0, pMX Comments: There are a total of four distinct areas of abnormality identified. The first 1.5 cm corresponds to the invasive ductal carcinoma. The second 0.9 cm area consists of high grade ductal carcinoma in situ with associated fibrocystic change and fibroadenomatoid nodules. In this focus, the in situ carcinoma is 1 mm from the nearest lateral margin. The third 0.4 cm area consists of fat necrosis and fibrosis. There are no atypical or malignant findings in this area. The fourth and final 0.3 cm area demonstrate non-neoplastic findings to include fibrocystic change. There are no atypical or malignant epithelial findings in this area either. 3. BREAST, INVASIVE TUMOR, WITH LYMPH NODE SAMPLING Specimen, including laterality: Left breast Procedure: Lumpectomy Grade: I of III Tubule formation: 3 Nuclear pleomorphism: 3 Mitotic: 1 Tumor size (gross measurement): 1.0 cm Margins: Invasive, distance to closest margin: Present at posterior In-situ, distance to closest margin: Present at posterior If margin positive, focally or broadly: Broadly (invasive) and focally (in situ) Lymphovascular invasion: Present Ductal carcinoma in situ: Present Grade: I of III Extensive intraductal component:  Absent Lobular neoplasia: Absent Tumor focality: Unifocal Treatment effect: None If present, treatment effect in breast tissue, lymph nodes or both: N/A Extent of tumor: Skin: N/A Nipple: N/A Skeletal muscle: N/A Lymph nodes: # examined: 1 (Part 5) Lymph nodes with metastasis: 1 Isolated tumor cells (< 0.2 mm): 0 Micrometastasis: (> 0.2 mm and < 2.0 mm): 1 Macrometastasis: (> 2.0 mm): 0 3 of 5 FINAL for Amy Leonard, Amy Leonard ([id number]) Microscopic Comment(continued) Extracapsular extension: Present Breast prognostic profile: Estrogen receptor: Not repeated, previous study  demonstrated 100% positivity ([id number]). Progesterone receptor: Not repeated, previous study demonstrated 25% positivity ([id number]) Her 2 neu: Repeated, previous study demonstrated no amplification (1.42) ([id number]) Ki-67: Not repeated, previous study demonstrated 9% proliferation rate ([id number]) Non-neoplastic breast: Previous biopsy site, fibrocystic change and microcalcifications in benign ducts and lobules. TNM: pT1b, pN75mi, PMX Comments: The additional 1.0 cm and 1.1 cm hemorrhagic areas grossly identified consist of benign breast tissue with microcalcifications and parenchymal hemorrhage. There are no atypical or malignant findings in these areas. 4. The surgical resection margin(s) of the specimen were inked and microscopically evaluated. There is no mass grossly identified. Representative sections demonstrate predominantly benign fibrovascular and adipose tissue with focal areas of fibrocytic change and sclerosing adenosis. There are no atypical or malignant epithelial or stromal findings identified. 5. There are microscopic foci of intranodal metastatic mammary tumor deposits spanning up to 1 mm. There is focal extracapsular extension identified. (CR:kh 11-25-12) [place] RUND DO Pathologist, Electronic Signature (Case signed 11/25/2012) Specimen Gross and Clinical Information   RADIOGRAPHIC  STUDIES:  Chest 2 View  11/19/2012  *RADIOLOGY REPORT*  Clinical Data: Preop for breast cancer  CHEST - 2 VIEW  Comparison: None.  Findings: Cardiomediastinal silhouette is unremarkable.  No acute infiltrate or pleural effusion.  No pulmonary edema.  Bony thorax is unremarkable.  IMPRESSION: No active disease.   Original Report Authenticated By: Natasha Mead, M.D.    Nm Sentinel Node Inj-no Rpt (breast)  11/23/2012  CLINICAL DATA: Bilateral breast cancer   Sulfur colloid was injected intradermally by the nuclear medicine  technologist for breast cancer sentinel node localization.     Dg Chest Portable 1 View  11/23/2012  *RADIOLOGY REPORT*  Clinical Data: 52 year old female status post Port-A-Cath placement.  Breast cancer.  PORTABLE CHEST - 1 VIEW  Comparison: 11/19/2012.  Findings: Portable semi upright AP view 1441 hours.  Right chest subclavian approach Port-A-Cath placed.  Acute angulation of the catheter tubing at the level of the right axilla.  Tip at the level of the cavoatrial junction.  Interval postoperative changes to the right chest wall with subcutaneous appearing drain in place.  No pneumothorax.  No pleural effusion or pulmonary edema.  Mildly increased bibasilar opacity compatible with atelectasis.  Cardiac size and mediastinal contours are within normal limits.  Visualized tracheal air column is within normal limits.  IMPRESSION: 1.  Right subclavian approach chest Port-A-Cath in place.  Tip at the level of the cavoatrial junction.  Mildly angulated catheter tubing at the level of the axilla. 2.  Interval postoperative changes to the right chest wall. No pneumothorax. 3.  Mild dependent atelectasis.   Original Report Authenticated By: Erskine Speed, M.D.    Dg Fluoro Guide Cv Line-no Report  11/23/2012  CLINICAL DATA: bil breast cancer   FLOURO GUIDE CV LINE  Fluoroscopy was utilized by the requesting physician.  No radiographic  interpretation.     Mm Lt Plc Breast Loc Dev   1st Lesion   Inc Mammo Guide  11/23/2012  *RADIOLOGY REPORT*  Clinical Data:  Left breast cancer  NEEDLE LOCALIZATION WITH MAMMOGRAPHIC GUIDANCE AND SPECIMEN RADIOGRAPH  Comparison:  Previous exams.  Patient presents for needle localization prior to surgery.  I met with the patient and we discussed the procedure of needle localization including benefits and alternatives. We discussed the high likelihood of a successful procedure. We discussed the risks of the procedure, including infection, bleeding, tissue injury, and further surgery. Informed, written consent was given.  Using mammographic guidance, sterile technique, 2%  lidocaine and a #9 modified Kopans needle, the mass and clip in the central aspect of the left breast was localized using a superior approach.  The films are marked for Dr. Derrell Lolling.  Specimen radiograph was performed at [facility], and confirms the mass and clip were present in the tissue sample. The clip was located at the margin of the surgical specimen.  Dr.  Derrell Lolling states she had taken more tissue at the margin to clear the border.  The specimen is marked for pathology.  IMPRESSION: Needle localization of the left breast.  No apparent complications.   Original Report Authenticated By: Baird Lyons, M.D.    Mm Rt Plc Breast Loc Dev   1st Lesion  Inc Mammo Guide  11/23/2012  *RADIOLOGY REPORT*  Clinical Data:  Known right breast cancer  NEEDLE LOCALIZATION WITH MAMMOGRAPHIC GUIDANCE AND SPECIMEN RADIOGRAPH  Comparison:  Previous exams.  Patient presents for needle localization prior to surgery.  I met with the patient and we discussed the procedure of needle localization including benefits and alternatives. We discussed the high likelihood of a successful procedure. We discussed the risks of the procedure, including infection, bleeding, tissue injury, and further surgery. Informed, written consent was given.  Using mammographic guidance, sterile technique, 2% lidocaine and a #7 and #9 modified Kopans  needles, the mass and nodularity in the upper outer quadrant of the right breast was bracketedusing a lateral approach.  The films are marked for Dr. Derrell Lolling.  Specimen radiograph was performed at Day Surgery, and confirms the mass and clip are present in the tissue sample.  The specimen is marked for pathology.  IMPRESSION: Needle localization of the right breast.  No apparent complications.   Original Report Authenticated By: Baird Lyons, M.D.     ASSESSMENT: 52 year old female with  #1 bilateral breast cancers triple negative on the right side ER positive on the left side. She is status post bilateral lumpectomies. Clinically she's doing well. She will receive Adriamycin Cytoxan given dose dense x4 cycles followed by Taxol carbo x12 cycles.  Rationale risks benefits and side effects were discussed.  Patient did develop febrile neutropenia after first cycle, should it recur, a dose reduction will be considered.   #2 patient has had an echocardiogram Port-A-Cath placed and chemotherapy teaching class.   PLAN:   #1 Doing well. Proceed with chemotherapy.  Labs are stable.    2. She will see [place] back in 1 week for interim labs and office visit.  All questions were answered. The patient knows to call the clinic with any problems, questions or concerns. We can certainly see the patient much sooner if necessary.  I spent 25 minutes counseling the patient face to face. The total time spent in the appointment was 30 minutes.  Cherie Ouch Lyn Hollingshead, NP Medical Oncology [facility] Phone: [phone] 01/27/2013, 12:07 PM

## 2013-01-27 NOTE — Patient Instructions (Addendum)
[facility] Discharge Instructions for Patients Receiving Chemotherapy  Today you received the following chemotherapy agents Adriamycin and Cytoxan.  To help prevent nausea and vomiting after your treatment, we encourage you to take your nausea medication as prescribed.   If you develop nausea and vomiting that is not controlled by your nausea medication, call the clinic.   BELOW ARE SYMPTOMS THAT SHOULD BE REPORTED IMMEDIATELY:  *FEVER GREATER THAN 100.5 F  *CHILLS WITH OR WITHOUT FEVER  NAUSEA AND VOMITING THAT IS NOT CONTROLLED WITH YOUR NAUSEA MEDICATION  *UNUSUAL SHORTNESS OF BREATH  *UNUSUAL BRUISING OR BLEEDING  TENDERNESS IN MOUTH AND THROAT WITH OR WITHOUT PRESENCE OF ULCERS  *URINARY PROBLEMS  *BOWEL PROBLEMS  UNUSUAL RASH Items with * indicate a potential emergency and should be followed up as soon as possible.  Feel free to call the clinic you have any questions or concerns. The clinic phone number is [phone].    

## 2013-01-28 ENCOUNTER — Ambulatory Visit (HOSPITAL_BASED_OUTPATIENT_CLINIC_OR_DEPARTMENT_OTHER): Payer: BC Managed Care – PPO

## 2013-01-28 VITALS — BP 130/77 | HR 116 | Temp 98.3°F

## 2013-01-28 DIAGNOSIS — C50919 Malignant neoplasm of unspecified site of unspecified female breast: Secondary | ICD-10-CM

## 2013-01-28 DIAGNOSIS — C50419 Malignant neoplasm of upper-outer quadrant of unspecified female breast: Secondary | ICD-10-CM

## 2013-01-28 DIAGNOSIS — C50411 Malignant neoplasm of upper-outer quadrant of right female breast: Secondary | ICD-10-CM

## 2013-01-28 DIAGNOSIS — Z5189 Encounter for other specified aftercare: Secondary | ICD-10-CM

## 2013-01-28 MED ORDER — PEGFILGRASTIM INJECTION 6 MG/0.6ML
6.0000 mg | Freq: Once | SUBCUTANEOUS | Status: AC
  Administered 2013-01-28: 6 mg via SUBCUTANEOUS
  Filled 2013-01-28: qty 0.6

## 2013-02-03 ENCOUNTER — Telehealth: Payer: Self-pay | Admitting: Oncology

## 2013-02-03 ENCOUNTER — Encounter: Payer: Self-pay | Admitting: Adult Health

## 2013-02-03 ENCOUNTER — Other Ambulatory Visit (HOSPITAL_BASED_OUTPATIENT_CLINIC_OR_DEPARTMENT_OTHER): Payer: BC Managed Care – PPO | Admitting: Lab

## 2013-02-03 ENCOUNTER — Ambulatory Visit (HOSPITAL_BASED_OUTPATIENT_CLINIC_OR_DEPARTMENT_OTHER): Payer: BC Managed Care – PPO | Admitting: Adult Health

## 2013-02-03 ENCOUNTER — Ambulatory Visit (HOSPITAL_BASED_OUTPATIENT_CLINIC_OR_DEPARTMENT_OTHER): Payer: BC Managed Care – PPO

## 2013-02-03 VITALS — BP 97/65 | HR 110 | Temp 98.4°F | Resp 20 | Ht 63.0 in | Wt 189.3 lb

## 2013-02-03 DIAGNOSIS — C50919 Malignant neoplasm of unspecified site of unspecified female breast: Secondary | ICD-10-CM

## 2013-02-03 DIAGNOSIS — C50411 Malignant neoplasm of upper-outer quadrant of right female breast: Secondary | ICD-10-CM

## 2013-02-03 DIAGNOSIS — E860 Dehydration: Secondary | ICD-10-CM

## 2013-02-03 DIAGNOSIS — C50419 Malignant neoplasm of upper-outer quadrant of unspecified female breast: Secondary | ICD-10-CM

## 2013-02-03 LAB — COMPREHENSIVE METABOLIC PANEL (CC13)
ALT: 13 U/L (ref 0–55)
CO2: 24 mEq/L (ref 22–29)
Calcium: 8.9 mg/dL (ref 8.4–10.4)
Chloride: 104 mEq/L (ref 98–107)
Creatinine: 0.7 mg/dL (ref 0.6–1.1)
Glucose: 110 mg/dl — ABNORMAL HIGH (ref 70–99)
Total Protein: 6 g/dL — ABNORMAL LOW (ref 6.4–8.3)

## 2013-02-03 LAB — CBC WITH DIFFERENTIAL/PLATELET
BASO%: 0 % (ref 0.0–2.0)
Eosinophils Absolute: 0 10*3/uL (ref 0.0–0.5)
HCT: 31.1 % — ABNORMAL LOW (ref 34.8–46.6)
HGB: 10.9 g/dL — ABNORMAL LOW (ref 11.6–15.9)
MCHC: 35.1 g/dL (ref 31.5–36.0)
MONO#: 0 10*3/uL — ABNORMAL LOW (ref 0.1–0.9)
NEUT#: 0 10*3/uL — CL (ref 1.5–6.5)
NEUT%: 2.3 % — ABNORMAL LOW (ref 38.4–76.8)
WBC: 0.5 10*3/uL — CL (ref 3.9–10.3)
lymph#: 0.5 10*3/uL — ABNORMAL LOW (ref 0.9–3.3)

## 2013-02-03 MED ORDER — SODIUM CHLORIDE 0.9 % IV SOLN
Freq: Once | INTRAVENOUS | Status: AC
  Administered 2013-02-03: 14:00:00 via INTRAVENOUS

## 2013-02-03 NOTE — Patient Instructions (Addendum)
Dehydration, Adult Dehydration is when you lose more fluids from the body than you take in. Vital organs like the kidneys, brain, and heart cannot function without a proper amount of fluids and salt. Any loss of fluids from the body can cause dehydration.  CAUSES   Vomiting.  Diarrhea.  Excessive sweating.  Excessive urine output.  Fever. SYMPTOMS  Mild dehydration  Thirst.  Dry lips.  Slightly dry mouth. Moderate dehydration  Very dry mouth.  Sunken eyes.  Skin does not bounce back quickly when lightly pinched and released.  Dark urine and decreased urine production.  Decreased tear production.  Headache. Severe dehydration  Very dry mouth.  Extreme thirst.  Rapid, weak pulse (more than 100 beats per minute at rest).  Cold hands and feet.  Not able to sweat in spite of heat and temperature.  Rapid breathing.  Blue lips.  Confusion and lethargy.  Difficulty being awakened.  Minimal urine production.  No tears. DIAGNOSIS  Your caregiver will diagnose dehydration based on your symptoms and your exam. Blood and urine tests will help confirm the diagnosis. The diagnostic evaluation should also identify the cause of dehydration. TREATMENT  Treatment of mild or moderate dehydration can often be done at home by increasing the amount of fluids that you drink. It is best to drink small amounts of fluid more often. Drinking too much at one time can make vomiting worse. Refer to the home care instructions below. Severe dehydration needs to be treated at the hospital where you will probably be given intravenous (IV) fluids that contain water and electrolytes. HOME CARE INSTRUCTIONS   Ask your caregiver about specific rehydration instructions.  Drink enough fluids to keep your urine clear or pale yellow.  Drink small amounts frequently if you have nausea and vomiting.  Eat as you normally do.  Avoid:  Foods or drinks high in sugar.  Carbonated  drinks.  Juice.  Extremely hot or cold fluids.  Drinks with caffeine.  Fatty, greasy foods.  Alcohol.  Tobacco.  Overeating.  Gelatin desserts.  Wash your hands well to avoid spreading bacteria and viruses.  Only take over-the-counter or prescription medicines for pain, discomfort, or fever as directed by your caregiver.  Ask your caregiver if you should continue all prescribed and over-the-counter medicines.  Keep all follow-up appointments with your caregiver. SEEK MEDICAL CARE IF:  You have abdominal pain and it increases or stays in one area (localizes).  You have a rash, stiff neck, or severe headache.  You are irritable, sleepy, or difficult to awaken.  You are weak, dizzy, or extremely thirsty. SEEK IMMEDIATE MEDICAL CARE IF:   You are unable to keep fluids down or you get worse despite treatment.  You have frequent episodes of vomiting or diarrhea.  You have blood or green matter (bile) in your vomit.  You have blood in your stool or your stool looks black and tarry.  You have not urinated in 6 to 8 hours, or you have only urinated a small amount of very dark urine.  You have a fever.  You faint. MAKE SURE YOU:   Understand these instructions.  Will watch your condition.  Will get help right away if you are not doing well or get worse. Document Released: 08/12/2005 Document Revised: 11/04/2011 Document Reviewed: 04/01/2011 Exit[employer] Patient Information 2014 [employer].  

## 2013-02-03 NOTE — Progress Notes (Signed)
OFFICE PROGRESS NOTE  CC  Amy Manns, MD [address] [place] [address], [place] [address] Dr. Claud Kelp  Dr. Antony Blackbird  DIAGNOSIS: 52 year old female with new diagnosis of bilateral breast cancers.   STAGE:  Cancer of upper-outer quadrant of female breast  Primary site: Breast (Bilateral)  Staging method: AJCC 7th Edition  Clinical: Stage IA (T1c, N0, cM0)  Summary: Stage IA (T1c, N0, cM0)   PRIOR THERAPY: #1Patient recently underwent screening mammograms and she was found to have bilateral breast abnormalities.diagnostic mammogram showed in the right breast an ill-defined 2 cm mass with a few associated well defined microcalcifications over the upper outer quadrant. Spot compression images of the left breast demonstrated an ill defined spiculated 8 mm mass centrally. Ultrasound showed irregular bordered heterogeneous hypoechoic solid mass at the 10:00 position of the right breast 6 cm from the nipple measuring 1.1 x 1.3 x 1.4 cm. Also in this location was an adjacent smaller irregular hypoechoic mass measuring 3 x 5 x 6 mm. The smaller mass was 1 cm from the large more irregular mass. Ultrasound of the axilla demonstrated single lymph node with thickened cortex. Ultrasound of the left breast demonstrated an ill-defined hypoechoic mass with distal acoustic shadowing 7 to 8:00 position 3 cm from the nipple located deep and measuring 5 x 8 x 8 mm ultrasound of the left axilla showed normal appearing lymph nodes.   #2Patient went on to have needle core biopsies performed of both of the masses. 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 The tumor was ER positive PR positive with a Ki-67 of 9% the second tumor was ER negative PR negative HER-2/neu negative with Ki-6792% and elevated.  #3 Patient had MRIs of the breasts performed. There was  noted to be 1.7 cm irregular enhancing mass located within the upper-outer quadrant of the right breast with 3 adjacent worrisome enhancing satellite nodules in aggregate the nodules and mass measures 3.8 cm. In the left breast 8 mm irregular enhancing mass located within the central left breast was noted. No evidence of axillary or internal mammary adenopathy.  #4patient is status post bilateral lumpectomies. The right breast revealed a 1.5 cm grade 3 invasive ductal carcinoma ER/PR negative HER-2/neu negative Ki-67 92%. Sentinel node was negative. Left breast showed 1.0 cm grade 3 invasive ductal carcinoma ER/PR positive HER-2/neu negative sentinel node was negative.  #5 patient is to begin adjuvant chemotherapy consisting of Adriamycin Cytoxan every 2 weeks for a total of 4 cycles 12/30/12. This would then be followed by Taxol and carboplatinum weekly for 12 weeks. Patient will need adjuvant radiation therapy. Because one of the tumor is ER positive she will also receive antiestrogen therapy.  CURRENT THERAPY:cycle 3 day 8 of adjuvant dose dense AC  INTERVAL HISTORY: Amy Leonard 52 y.o. female returns for followup visit after cycle three of her adjuvant Adriamycin/Cytoxan.  She is  MEDICAL HISTORY: Past Medical History  Diagnosis Date  . Elevated WBC count     nl diff  . Chronic back pain     spinal stenosis  . Tobacco abuse   . Anxiety and depression   . HSV infection     recurrent (side and buttox)  . Migraine   . Glaucoma     ?  . Carpal tunnel syndrome   . HLD (hyperlipidemia)   . Folliculitis 05/05/2011  .  Anxiety   . Depression   . 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 GERD (gastroesophageal reflux disease)   . Hypertension   . Breast cancer   . Wears dentures     top    ALLERGIES:  is allergic to bupropion hcl; chlorhexidine gluconate; hydrocod polst-cpm polst er; lisinopril; and naproxen sodium.  MEDICATIONS:  Current Outpatient Prescriptions  Medication Sig  Dispense Refill  . Acetaminophen (TYLENOL ARTHRITIS PAIN PO) Take 2 tablets by mouth as needed (pain).       Marland Kitchen acyclovir (ZOVIRAX) 5 % ointment Apply 1 application topically as needed (flare-up).       Marland Kitchen aspirin 81 MG chewable tablet Chew 81 mg by mouth daily.      . ATIVAN 0.5 MG tablet Take 1 tablet (0.5 mg total) by mouth every 6 (six) hours as needed (Nausea or vomiting).  45 tablet  0  . atorvastatin (LIPITOR) 10 MG tablet Take 1 tablet (10 mg total) by mouth daily.  90 tablet  1  . calcium carbonate (TUMS - DOSED IN MG ELEMENTAL CALCIUM) 500 MG chewable tablet Chew by mouth as directed.        . ciprofloxacin (CIPRO) 500 MG tablet Take 1 tablet (500 mg total) by mouth 2 (two) times daily.  14 tablet  3  . COMPRO 25 MG suppository       . cyclobenzaprine (FLEXERIL) 10 MG tablet Take 10 mg by mouth as needed (muscle spasm).       Marland Kitchen dexamethasone (DECADRON) 4 MG tablet Take 2 tablets by mouth once a day on the day after chemotherapy and then take 2 tablets two times a day for 2 days. Take with food.  30 tablet  1  . fluconazole (DIFLUCAN) 100 MG tablet Take 2 tablets (200 mg total) by mouth daily.  14 tablet  0  . lidocaine-prilocaine (EMLA) cream Apply topically as needed.  30 g  8  . losartan (COZAAR) 50 MG tablet Take 1 tablet (50 mg total) by mouth daily.  90 tablet  2  . nystatin (MYCOSTATIN) 100000 UNIT/ML suspension Take 5 mLs (500,000 Units total) by mouth 2 (two) times daily.  240 mL  3  . omeprazole (PRILOSEC) 40 MG capsule Take 1 capsule (40 mg total) by mouth 2 (two) times daily.  180 capsule  3  . ondansetron (ZOFRAN) 8 MG tablet Take 1 tablet (8 mg total) by mouth 2 (two) times daily as needed. Take two times a day as needed for nausea or vomiting starting on the third day after chemotherapy.  30 tablet  1  . prochlorperazine (COMPAZINE) 10 MG tablet Take 1 tablet (10 mg total) by mouth every 6 (six) hours as needed (Nausea or vomiting).  30 tablet  1  . simethicone (MYLICON) 80 MG  chewable tablet Chew 1 tablet (80 mg total) by mouth every 8 (eight) hours as needed for flatulence.  60 tablet  3   No current facility-administered medications for this visit.    SURGICAL HISTORY:  Past Surgical History  Procedure Laterality Date  . Btl    . Epidural steroid injection  2008  . Colonoscopy    . Upper gi endoscopy    . Partial mastectomy with needle localization and axillary sentinel lymph node bx Bilateral 11/23/2012    Procedure: PARTIAL MASTECTOMY WITH NEEDLE LOCALIZATION AND AXILLARY SENTINEL LYMPH NODE BX;  Surgeon: Ernestene Mention, MD;  Location: [facility];  Service: General;  Laterality: Bilateral;  . Portacath  placement Right 11/23/2012    Procedure: INSERTION PORT-A-CATH;  Surgeon: Ernestene Mention, MD;  Location: [facility];  Service: General;  Laterality: Right;    REVIEW OF SYSTEMS:  Pertinent items are noted in HPI.   PHYSICAL EXAMINATION: Blood pressure 97/65, pulse 110, temperature 98.4 F (36.9 C), temperature source Oral, resp. rate 20, height 5\' 3"  (1.6 m), weight 189 lb 4.8 oz (85.866 kg). Body mass index is 33.54 kg/(m^2). General: Patient is a well appearing female in no acute distress HEENT: PERRLA, sclerae anicteric no conjunctival pallor, MMM Neck: supple, no palpable adenopathy Lungs: clear to auscultation bilaterally, no wheezes, rhonchi, or rales Cardiovascular: regular rate rhythm, S1, S2, no murmurs, rubs or gallops Abdomen: Soft, non-tender, non-distended, normoactive bowel sounds, no HSM Extremities: warm and well perfused, no clubbing, cyanosis, or edema Skin: No rashes or lesions Neuro: Non-focal Breasts: Bilateral lumpectomy sites without nodularity, no masses or skin changes in either breast. ECOG PERFORMANCE STATUS: 0 - Asymptomatic  LABORATORY DATA: Lab Results  Component Value Date   WBC 0.5* 02/03/2013   HGB 10.9* 02/03/2013   HCT 31.1* 02/03/2013   MCV 92.2 02/03/2013   PLT 118* 02/03/2013       Chemistry      Component Value Date/Time   NA 137 02/03/2013 1106   NA 135 01/08/2013 0555   K 3.6 02/03/2013 1106   K 4.0 01/08/2013 0555   CL 104 02/03/2013 1106   CL 100 01/08/2013 0555   CO2 24 02/03/2013 1106   CO2 28 01/08/2013 0555   BUN 22.2 02/03/2013 1106   BUN 10 01/08/2013 0555   CREATININE 0.7 02/03/2013 1106   CREATININE 0.62 01/08/2013 0555      Component Value Date/Time   CALCIUM 8.9 02/03/2013 1106   CALCIUM 9.1 01/08/2013 0555   ALKPHOS 79 02/03/2013 1106   ALKPHOS 67 01/05/2013 2245   AST 7 02/03/2013 1106   AST 10 01/05/2013 2245   ALT 13 02/03/2013 1106   ALT 15 01/05/2013 2245   BILITOT 0.58 02/03/2013 1106   BILITOT 0.5 01/05/2013 2245    ADDITIONAL INFORMATION: 1. CHROMOGENIC IN-SITU HYBRIDIZATION Results: HER-2/NEU BY CISH - NO AMPLIFICATION OF HER-2 DETECTED. RESULT RATIO OF HER2: CEP 17 SIGNALS 1.00 AVERAGE HER2 COPY NUMBER PER CELL 1.55 REFERENCE RANGE NEGATIVE HER2/Chr17 Ratio <2.0 and Average HER2 copy number <4.0 EQUIVOCAL HER2/Chr17 Ratio <2.0 and Average HER2 copy number 4.0 and <6.0 POSITIVE HER2/Chr17 Ratio >=2.0 and/or Average HER2 copy number >=6.0 Pecola Leisure MD Pathologist, Electronic Signature ( Signed 12/01/2012) 3. CHROMOGENIC IN-SITU HYBRIDIZATION Results: HER-2/NEU BY CISH - NO AMPLIFICATION OF HER-2 DETECTED. RESULT RATIO OF HER2: CEP 17 SIGNALS 1.22 AVERAGE HER2 COPY NUMBER PER CELL 1.40 REFERENCE RANGE NEGATIVE HER2/Chr17 Ratio <2.0 and Average HER2 copy number <4.0 EQUIVOCAL HER2/Chr17 Ratio <2.0 and Average HER2 copy number 4.0 and <6.0 POSITIVE HER2/Chr17 Ratio >=2.0 and/or Average HER2 copy number >=6.0 1 of 5 FINAL for Amy Leonard, Amy Leonard ([id number]) ADDITIONAL INFORMATION:(continued) Pecola Leisure MD Pathologist, Electronic Signature ( Signed 12/01/2012) FINAL DIAGNOSIS Diagnosis 1. Breast, lumpectomy, Right - INVASIVE DUCTAL CARCINOMA, GRADE III, SEE COMMENT. - INVASIVE CARCINOMA IS 0.9 CM FROM NEAREST MARGIN  (INFERIOR). - NO LYMPHOVASCULAR INVASION IDENTIFIED. - HIGH GRADE DUCTAL CARCINOMA IN SITU - IN SITU CARCINOMA IS 1 MM FROM THE NEAREST LATERAL MARGIN - SEE TUMOR SYNOPTIC TEMPLATE BELOW. 2. Lymph nodes, regional resection, Right axilla - ELEVEN LYMPH NODES, NEGATIVE FOR TUMOR (0/11). 3. Breast, lumpectomy, Left - INVASIVE DUCTAL CARCINOMA, GRADE I, SEE  COMMENT. - LYMPHOVASCULAR INVASION IDENTIFIED - INVASIVE CARCINOMA IS BROADLY PRESENT AT POSTERIOR MARGIN - DUCTAL CARCINOMA IN SITU, GRADE I. - IN SITU CARCINOMA IS FOCALLY PRESENT AT POSTERIOR MARGIN - SEE TUMOR SYNOPTIC TEMPLATE BELOW. 4. Breast, excision, Left - BENIGN BREAST TISSUE, SEE COMMENT. - NEGATIVE FOR ATYPIA OR MALIGNANCY. - SURGICAL MARGIN, NEGATIVE FOR ATYPIA OR MALIGNANCY. 5. Lymph node, sentinel, biopsy, Left axillary - ONE LYMPH NODE, POSITIVE FOR METASTATIC MAMMARY CARCINOMA (1/1). Microscopic Comment 1. BREAST, INVASIVE TUMOR, WITH LYMPH NODE SAMPLING Specimen, including laterality: Right breast Procedure: Lumpectomy Grade: III of III Tubule formation: 3 Nuclear pleomorphism: 3 Mitotic: 3 Tumor size (gross measurement: 1.5 cm Margins: Invasive, distance to closest margin: 0.9 cm In-situ, distance to closest margin: 0.1 cm If margin positive, focally or broadly: N/A Lymphovascular invasion: Absent Ductal carcinoma in situ: Present Grade: III of III Extensive intraductal component: Absent Lobular neoplasia: Absent Tumor focality: Multifocal Treatment effect: None 2 of 5 FINAL for Amy Leonard, Amy Leonard ([id number]) Microscopic Comment(continued) If present, treatment effect in breast tissue, lymph nodes or both: N/A Extent of tumor: Skin: N/A Nipple: N/A Skeletal muscle: N/A Lymph nodes: # examined: 11 (Part 2) Lymph nodes with metastasis: 0 Breast prognostic profile: Estrogen receptor: Repeated, previous study demonstrated 0% positivity ([id number]) Progesterone receptor: Repeated, previous study  demonstrated 0% positivity ([id number]) Her 2 neu: Repeated, previous study demonstrated no amplification (1.38) ([id number]) Ki-67: Not repeated, previous study demonstrated 92% proliferation rate ([id number]) Non-neoplastic breast: Previous biopsy site, fibrocystic change, sclerosing adenosis and benign calcifications in benign ducts and lobules. TNM: pT1c, pN0, pMX Comments: There are a total of four distinct areas of abnormality identified. The first 1.5 cm corresponds to the invasive ductal carcinoma. The second 0.9 cm area consists of high grade ductal carcinoma in situ with associated fibrocystic change and fibroadenomatoid nodules. In this focus, the in situ carcinoma is 1 mm from the nearest lateral margin. The third 0.4 cm area consists of fat necrosis and fibrosis. There are no atypical or malignant findings in this area. The fourth and final 0.3 cm area demonstrate non-neoplastic findings to include fibrocystic change. There are no atypical or malignant epithelial findings in this area either. 3. BREAST, INVASIVE TUMOR, WITH LYMPH NODE SAMPLING Specimen, including laterality: Left breast Procedure: Lumpectomy Grade: I of III Tubule formation: 3 Nuclear pleomorphism: 3 Mitotic: 1 Tumor size (gross measurement): 1.0 cm Margins: Invasive, distance to closest margin: Present at posterior In-situ, distance to closest margin: Present at posterior If margin positive, focally or broadly: Broadly (invasive) and focally (in situ) Lymphovascular invasion: Present Ductal carcinoma in situ: Present Grade: I of III Extensive intraductal component: Absent Lobular neoplasia: Absent Tumor focality: Unifocal Treatment effect: None If present, treatment effect in breast tissue, lymph nodes or both: N/A Extent of tumor: Skin: N/A Nipple: N/A Skeletal muscle: N/A Lymph nodes: # examined: 1 (Part 5) Lymph nodes with metastasis: 1 Isolated tumor cells (< 0.2 mm): 0 Micrometastasis: (>  0.2 mm and < 2.0 mm): 1 Macrometastasis: (> 2.0 mm): 0 3 of 5 FINAL for Amy Leonard, Amy Leonard ([id number]) Microscopic Comment(continued) Extracapsular extension: Present Breast prognostic profile: Estrogen receptor: Not repeated, previous study demonstrated 100% positivity ([id number]). Progesterone receptor: Not repeated, previous study demonstrated 25% positivity ([id number]) Her 2 neu: Repeated, previous study demonstrated no amplification (1.42) ([id number]) Ki-67: Not repeated, previous study demonstrated 9% proliferation rate ([id number]) Non-neoplastic breast: Previous biopsy site, fibrocystic change and microcalcifications in benign ducts and lobules. TNM: pT1b, pN35mi, PMX Comments: The additional 1.0 cm and 1.1 cm  hemorrhagic areas grossly identified consist of benign breast tissue with microcalcifications and parenchymal hemorrhage. There are no atypical or malignant findings in these areas. 4. The surgical resection margin(s) of the specimen were inked and microscopically evaluated. There is no mass grossly identified. Representative sections demonstrate predominantly benign fibrovascular and adipose tissue with focal areas of fibrocytic change and sclerosing adenosis. There are no atypical or malignant epithelial or stromal findings identified. 5. There are microscopic foci of intranodal metastatic mammary tumor deposits spanning up to 1 mm. There is focal extracapsular extension identified. (CR:kh 11-25-12) [place] RUND DO Pathologist, Electronic Signature (Case signed 11/25/2012) Specimen Gross and Clinical Information   RADIOGRAPHIC STUDIES:  Chest 2 View  11/19/2012  *RADIOLOGY REPORT*  Clinical Data: Preop for breast cancer  CHEST - 2 VIEW  Comparison: None.  Findings: Cardiomediastinal silhouette is unremarkable.  No acute infiltrate or pleural effusion.  No pulmonary edema.  Bony thorax is unremarkable.  IMPRESSION: No active disease.   Original Report Authenticated By:  Natasha Mead, M.D.    Nm Sentinel Node Inj-no Rpt (breast)  11/23/2012  CLINICAL DATA: Bilateral breast cancer   Sulfur colloid was injected intradermally by the nuclear medicine  technologist for breast cancer sentinel node localization.     Dg Chest Portable 1 View  11/23/2012  *RADIOLOGY REPORT*  Clinical Data: 52 year old female status post Port-A-Cath placement.  Breast cancer.  PORTABLE CHEST - 1 VIEW  Comparison: 11/19/2012.  Findings: Portable semi upright AP view 1441 hours.  Right chest subclavian approach Port-A-Cath placed.  Acute angulation of the catheter tubing at the level of the right axilla.  Tip at the level of the cavoatrial junction.  Interval postoperative changes to the right chest wall with subcutaneous appearing drain in place.  No pneumothorax.  No pleural effusion or pulmonary edema.  Mildly increased bibasilar opacity compatible with atelectasis.  Cardiac size and mediastinal contours are within normal limits.  Visualized tracheal air column is within normal limits.  IMPRESSION: 1.  Right subclavian approach chest Port-A-Cath in place.  Tip at the level of the cavoatrial junction.  Mildly angulated catheter tubing at the level of the axilla. 2.  Interval postoperative changes to the right chest wall. No pneumothorax. 3.  Mild dependent atelectasis.   Original Report Authenticated By: Erskine Speed, M.D.    Dg Fluoro Guide Cv Line-no Report  11/23/2012  CLINICAL DATA: bil breast cancer   FLOURO GUIDE CV LINE  Fluoroscopy was utilized by the requesting physician.  No radiographic  interpretation.     Mm Lt Plc Breast Loc Dev   1st Lesion  Inc Mammo Guide  11/23/2012  *RADIOLOGY REPORT*  Clinical Data:  Left breast cancer  NEEDLE LOCALIZATION WITH MAMMOGRAPHIC GUIDANCE AND SPECIMEN RADIOGRAPH  Comparison:  Previous exams.  Patient presents for needle localization prior to surgery.  I met with the patient and we discussed the procedure of needle localization including benefits and  alternatives. We discussed the high likelihood of a successful procedure. We discussed the risks of the procedure, including infection, bleeding, tissue injury, and further surgery. Informed, written consent was given.  Using mammographic guidance, sterile technique, 2% lidocaine and a #9 modified Kopans needle, the mass and clip in the central aspect of the left breast was localized using a superior approach.  The films are marked for Dr. Derrell Lolling.  Specimen radiograph was performed at [facility], and confirms the mass and clip were present in the tissue sample. The clip was located at the margin of  the surgical specimen.  Dr.  Derrell Lolling states she had taken more tissue at the margin to clear the border.  The specimen is marked for pathology.  IMPRESSION: Needle localization of the left breast.  No apparent complications.   Original Report Authenticated By: Baird Lyons, M.D.    Mm Rt Plc Breast Loc Dev   1st Lesion  Inc Mammo Guide  11/23/2012  *RADIOLOGY REPORT*  Clinical Data:  Known right breast cancer  NEEDLE LOCALIZATION WITH MAMMOGRAPHIC GUIDANCE AND SPECIMEN RADIOGRAPH  Comparison:  Previous exams.  Patient presents for needle localization prior to surgery.  I met with the patient and we discussed the procedure of needle localization including benefits and alternatives. We discussed the high likelihood of a successful procedure. We discussed the risks of the procedure, including infection, bleeding, tissue injury, and further surgery. Informed, written consent was given.  Using mammographic guidance, sterile technique, 2% lidocaine and a #7 and #9 modified Kopans needles, the mass and nodularity in the upper outer quadrant of the right breast was bracketedusing a lateral approach.  The films are marked for Dr. Derrell Lolling.  Specimen radiograph was performed at Day Surgery, and confirms the mass and clip are present in the tissue sample.  The specimen is marked for pathology.  IMPRESSION: Needle localization of the  right breast.  No apparent complications.   Original Report Authenticated By: Baird Lyons, M.D.     ASSESSMENT: 52 year old female with  #1 bilateral breast cancers triple negative on the right side ER positive on the left side. She is status post bilateral lumpectomies. Clinically she's doing well. She will receive Adriamycin Cytoxan given dose dense x4 cycles followed by Taxol carbo x12 cycles.  Rationale risks benefits and side effects were discussed.  Patient did develop febrile neutropenia after first cycle, should it recur, a dose reduction will be considered.   #2 patient has had an echocardiogram Port-A-Cath placed and chemotherapy teaching class.   PLAN:   #1 Doing well. Proceed with chemotherapy.  Labs are stable.    2. She will see [place] back in 1 week for interim labs and office visit.  All questions were answered. The patient knows to call the clinic with any problems, questions or concerns. We can certainly see the patient much sooner if necessary.  I spent 25 minutes counseling the patient face to face. The total time spent in the appointment was 30 minutes.  Cherie Ouch Lyn Hollingshead, NP Medical Oncology [facility] Phone: [phone] 02/03/2013, 12:36 PM

## 2013-02-03 NOTE — Progress Notes (Signed)
OFFICE PROGRESS NOTE  CC  Amy Manns, MD [address] [place] [address] [place] [address] Dr. Claud Kelp  Dr. Antony Blackbird  DIAGNOSIS: 52 year old female with new diagnosis of bilateral breast cancers.   STAGE:  Cancer of upper-outer quadrant of female breast  Primary site: Breast (Bilateral)  Staging method: AJCC 7th Edition  Clinical: Stage IA (T1c, N0, cM0)  Summary: Stage IA (T1c, N0, cM0)   PRIOR THERAPY: #1Patient recently underwent screening mammograms and she was found to have bilateral breast abnormalities.diagnostic mammogram showed in the right breast an ill-defined 2 cm mass with a few associated well defined microcalcifications over the upper outer quadrant. Spot compression images of the left breast demonstrated an ill defined spiculated 8 mm mass centrally. Ultrasound showed irregular bordered heterogeneous hypoechoic solid mass at the 10:00 position of the right breast 6 cm from the nipple measuring 1.1 x 1.3 x 1.4 cm. Also in this location was an adjacent smaller irregular hypoechoic mass measuring 3 x 5 x 6 mm. The smaller mass was 1 cm from the large more irregular mass. Ultrasound of the axilla demonstrated single lymph node with thickened cortex. Ultrasound of the left breast demonstrated an ill-defined hypoechoic mass with distal acoustic shadowing 7 to 8:00 position 3 cm from the nipple located deep and measuring 5 x 8 x 8 mm ultrasound of the left axilla showed normal appearing lymph nodes.   #2Patient went on to have needle core biopsies performed of both of the masses. 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 The tumor was ER positive PR positive with a Ki-67 of 9% the second tumor was ER negative PR negative HER-2/neu negative with Ki-6792% and elevated.  #3 Patient had MRIs of the breasts performed. There was  noted to be 1.7 cm irregular enhancing mass located within the upper-outer quadrant of the right breast with 3 adjacent worrisome enhancing satellite nodules in aggregate the nodules and mass measures 3.8 cm. In the left breast 8 mm irregular enhancing mass located within the central left breast was noted. No evidence of axillary or internal mammary adenopathy.  #4patient is status post bilateral lumpectomies. The right breast revealed a 1.5 cm grade 3 invasive ductal carcinoma ER/PR negative HER-2/neu negative Ki-67 92%. Sentinel node was negative. Left breast showed 1.0 cm grade 3 invasive ductal carcinoma ER/PR positive HER-2/neu negative sentinel node was negative.  #5 patient is to begin adjuvant chemotherapy consisting of Adriamycin Cytoxan every 2 weeks for a total of 4 cycles 12/30/12. This would then be followed by Taxol and carboplatinum weekly for 12 weeks. Patient will need adjuvant radiation therapy. Because one of the tumor is ER positive she will also receive antiestrogen therapy.  CURRENT THERAPY:cycle 3 day 8 of adjuvant dose dense AC  INTERVAL HISTORY: Amy Leonard 52 y.o. female returns for followup visit following cycle three of her adjuvant Adriamycin/Cytoxan.  She continues to have some shakiness since receiving the chemotherapy.  Her main complaint today is her feet and fingertips.  She states that the soles of her feet are red and painful since receiving chemo.  She feels as if she is walking on rocks.  She also has pain in her fingernails.  She does not have any numbness, difficulty walking, or balance changes.  She is taking super b complex daily.  She has been moisturizing her feet daily.  She is neutropenic today.  She is mildly hypotensive and slightly tachycardic.  She denies dizziness, palpitations, and states she is drinking fluid but has kept up with an exact amount.  She denies fevers, chills, nausea, vomiting, constipation, diarrhea, or any further concerns.  Otherwise,  a 10 point ROS is neg.   MEDICAL HISTORY: Past Medical History  Diagnosis Date  . Elevated WBC count     nl diff  . Chronic back pain     spinal stenosis  . Tobacco abuse   . Anxiety and depression   . HSV infection     recurrent (side and buttox)  . Migraine   . Glaucoma     ?  . Carpal tunnel syndrome   . HLD (hyperlipidemia)   . Folliculitis 05/05/2011  . Anxiety   . Depression   . 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 GERD (gastroesophageal reflux disease)   . Hypertension   . Breast cancer   . Wears dentures     top    ALLERGIES:  is allergic to bupropion hcl; chlorhexidine gluconate; hydrocod polst-cpm polst er; lisinopril; and naproxen sodium.  MEDICATIONS:  Current Outpatient Prescriptions  Medication Sig Dispense Refill  . Acetaminophen (TYLENOL ARTHRITIS PAIN PO) Take 2 tablets by mouth as needed (pain).       Marland Kitchen acyclovir (ZOVIRAX) 5 % ointment Apply 1 application topically as needed (flare-up).       Marland Kitchen aspirin 81 MG chewable tablet Chew 81 mg by mouth daily.      . ATIVAN 0.5 MG tablet Take 1 tablet (0.5 mg total) by mouth every 6 (six) hours as needed (Nausea or vomiting).  45 tablet  0  . atorvastatin (LIPITOR) 10 MG tablet Take 1 tablet (10 mg total) by mouth daily.  90 tablet  1  . calcium carbonate (TUMS - DOSED IN MG ELEMENTAL CALCIUM) 500 MG chewable tablet Chew by mouth as directed.        . ciprofloxacin (CIPRO) 500 MG tablet Take 1 tablet (500 mg total) by mouth 2 (two) times daily.  14 tablet  3  . COMPRO 25 MG suppository       . cyclobenzaprine (FLEXERIL) 10 MG tablet Take 10 mg by mouth as needed (muscle spasm).       Marland Kitchen dexamethasone (DECADRON) 4 MG tablet Take 2 tablets by mouth once a day on the day after chemotherapy and then take 2 tablets two times a day for 2 days. Take with food.  30 tablet  1  . fluconazole (DIFLUCAN) 100 MG tablet Take 2 tablets (200 mg total) by mouth daily.  14 tablet  0  . lidocaine-prilocaine (EMLA) cream Apply  topically as needed.  30 g  8  . losartan (COZAAR) 50 MG tablet Take 1 tablet (50 mg total) by mouth daily.  90 tablet  2  . nystatin (MYCOSTATIN) 100000 UNIT/ML suspension Take 5 mLs (500,000 Units total) by mouth 2 (two) times daily.  240 mL  3  . omeprazole (PRILOSEC) 40 MG capsule Take 1 capsule (40 mg total) by mouth 2 (two) times daily.  180 capsule  3  . ondansetron (ZOFRAN) 8 MG tablet Take 1 tablet (8 mg total) by mouth 2 (two) times daily as needed. Take two times a day as needed for nausea or vomiting starting on the third day after chemotherapy.  30 tablet  1  . prochlorperazine (COMPAZINE) 10 MG tablet Take 1 tablet (10 mg total) by mouth every 6 (six) hours  as needed (Nausea or vomiting).  30 tablet  1  . simethicone (MYLICON) 80 MG chewable tablet Chew 1 tablet (80 mg total) by mouth every 8 (eight) hours as needed for flatulence.  60 tablet  3   Current Facility-Administered Medications  Medication Dose Route Frequency Provider Last Rate Last Dose  . 0.9 %  sodium chloride infusion   Intravenous Once Augustin Schooling, NP        SURGICAL HISTORY:  Past Surgical History  Procedure Laterality Date  . Btl    . Epidural steroid injection  2008  . Colonoscopy    . Upper gi endoscopy    . Partial mastectomy with needle localization and axillary sentinel lymph node bx Bilateral 11/23/2012    Procedure: PARTIAL MASTECTOMY WITH NEEDLE LOCALIZATION AND AXILLARY SENTINEL LYMPH NODE BX;  Surgeon: Ernestene Mention, MD;  Location: [facility];  Service: General;  Laterality: Bilateral;  . Portacath placement Right 11/23/2012    Procedure: INSERTION PORT-A-CATH;  Surgeon: Ernestene Mention, MD;  Location:  SURGERY CENTER;  Service: General;  Laterality: Right;    REVIEW OF SYSTEMS:  Pertinent items are noted in HPI.   PHYSICAL EXAMINATION: Blood pressure 97/65, pulse 110, temperature 98.4 F (36.9 C), temperature source Oral, resp. rate 20, height 5\' 3"  (1.6 m),  weight 189 lb 4.8 oz (85.866 kg). Body mass index is 33.54 kg/(m^2). General: Patient is a well appearing female in no acute distress HEENT: PERRLA, sclerae anicteric no conjunctival pallor, MMM Neck: supple, no palpable adenopathy Lungs: clear to auscultation bilaterally, no wheezes, rhonchi, or rales Cardiovascular: regular rhythm, S1, S2, no murmurs, rubs or gallops, slightly tachycardic at 110 Abdomen: Soft, non-tender, non-distended, normoactive bowel sounds, no HSM Extremities: warm and well perfused, no clubbing, cyanosis, or edema, entire soles of feet are erythematous, no skin breakdown noted.  Her fingertips have no noted abnormality Skin: No rashes or lesions Neuro: Non-focal Breasts: Bilateral lumpectomy sites without nodularity, no masses or skin changes in either breast. ECOG PERFORMANCE STATUS: 0 - Asymptomatic  LABORATORY DATA: Lab Results  Component Value Date   WBC 0.5* 02/03/2013   HGB 10.9* 02/03/2013   HCT 31.1* 02/03/2013   MCV 92.2 02/03/2013   PLT 118* 02/03/2013      Chemistry      Component Value Date/Time   NA 137 02/03/2013 1106   NA 135 01/08/2013 0555   K 3.6 02/03/2013 1106   K 4.0 01/08/2013 0555   CL 104 02/03/2013 1106   CL 100 01/08/2013 0555   CO2 24 02/03/2013 1106   CO2 28 01/08/2013 0555   BUN 22.2 02/03/2013 1106   BUN 10 01/08/2013 0555   CREATININE 0.7 02/03/2013 1106   CREATININE 0.62 01/08/2013 0555      Component Value Date/Time   CALCIUM 8.9 02/03/2013 1106   CALCIUM 9.1 01/08/2013 0555   ALKPHOS 79 02/03/2013 1106   ALKPHOS 67 01/05/2013 2245   AST 7 02/03/2013 1106   AST 10 01/05/2013 2245   ALT 13 02/03/2013 1106   ALT 15 01/05/2013 2245   BILITOT 0.58 02/03/2013 1106   BILITOT 0.5 01/05/2013 2245    ADDITIONAL INFORMATION: 1. CHROMOGENIC IN-SITU HYBRIDIZATION Results: HER-2/NEU BY CISH - NO AMPLIFICATION OF HER-2 DETECTED. RESULT RATIO OF HER2: CEP 17 SIGNALS 1.00 AVERAGE HER2 COPY NUMBER PER CELL 1.55 REFERENCE RANGE NEGATIVE  HER2/Chr17 Ratio <2.0 and Average HER2 copy number <4.0 EQUIVOCAL HER2/Chr17 Ratio <2.0 and Average HER2 copy number 4.0 and <6.0 POSITIVE HER2/Chr17  Ratio >=2.0 and/or Average HER2 copy number >=6.0 Pecola Leisure MD Pathologist, Electronic Signature ( Signed 12/01/2012) 3. CHROMOGENIC IN-SITU HYBRIDIZATION Results: HER-2/NEU BY CISH - NO AMPLIFICATION OF HER-2 DETECTED. RESULT RATIO OF HER2: CEP 17 SIGNALS 1.22 AVERAGE HER2 COPY NUMBER PER CELL 1.40 REFERENCE RANGE NEGATIVE HER2/Chr17 Ratio <2.0 and Average HER2 copy number <4.0 EQUIVOCAL HER2/Chr17 Ratio <2.0 and Average HER2 copy number 4.0 and <6.0 POSITIVE HER2/Chr17 Ratio >=2.0 and/or Average HER2 copy number >=6.0 1 of 5 FINAL for GIOVANNINA, MUN ([id number]) ADDITIONAL INFORMATION:(continued) Pecola Leisure MD Pathologist, Electronic Signature ( Signed 12/01/2012) FINAL DIAGNOSIS Diagnosis 1. Breast, lumpectomy, Right - INVASIVE DUCTAL CARCINOMA, GRADE III, SEE COMMENT. - INVASIVE CARCINOMA IS 0.9 CM FROM NEAREST MARGIN (INFERIOR). - NO LYMPHOVASCULAR INVASION IDENTIFIED. - HIGH GRADE DUCTAL CARCINOMA IN SITU - IN SITU CARCINOMA IS 1 MM FROM THE NEAREST LATERAL MARGIN - SEE TUMOR SYNOPTIC TEMPLATE BELOW. 2. Lymph nodes, regional resection, Right axilla - ELEVEN LYMPH NODES, NEGATIVE FOR TUMOR (0/11). 3. Breast, lumpectomy, Left - INVASIVE DUCTAL CARCINOMA, GRADE I, SEE COMMENT. - LYMPHOVASCULAR INVASION IDENTIFIED - INVASIVE CARCINOMA IS BROADLY PRESENT AT POSTERIOR MARGIN - DUCTAL CARCINOMA IN SITU, GRADE I. - IN SITU CARCINOMA IS FOCALLY PRESENT AT POSTERIOR MARGIN - SEE TUMOR SYNOPTIC TEMPLATE BELOW. 4. Breast, excision, Left - BENIGN BREAST TISSUE, SEE COMMENT. - NEGATIVE FOR ATYPIA OR MALIGNANCY. - SURGICAL MARGIN, NEGATIVE FOR ATYPIA OR MALIGNANCY. 5. Lymph node, sentinel, biopsy, Left axillary - ONE LYMPH NODE, POSITIVE FOR METASTATIC MAMMARY CARCINOMA (1/1). Microscopic Comment 1. BREAST, INVASIVE TUMOR,  WITH LYMPH NODE SAMPLING Specimen, including laterality: Right breast Procedure: Lumpectomy Grade: III of III Tubule formation: 3 Nuclear pleomorphism: 3 Mitotic: 3 Tumor size (gross measurement: 1.5 cm Margins: Invasive, distance to closest margin: 0.9 cm In-situ, distance to closest margin: 0.1 cm If margin positive, focally or broadly: N/A Lymphovascular invasion: Absent Ductal carcinoma in situ: Present Grade: III of III Extensive intraductal component: Absent Lobular neoplasia: Absent Tumor focality: Multifocal Treatment effect: None 2 of 5 FINAL for EMARI, HREHA ([id number]) Microscopic Comment(continued) If present, treatment effect in breast tissue, lymph nodes or both: N/A Extent of tumor: Skin: N/A Nipple: N/A Skeletal muscle: N/A Lymph nodes: # examined: 11 (Part 2) Lymph nodes with metastasis: 0 Breast prognostic profile: Estrogen receptor: Repeated, previous study demonstrated 0% positivity ([id number]) Progesterone receptor: Repeated, previous study demonstrated 0% positivity ([id number]) Her 2 neu: Repeated, previous study demonstrated no amplification (1.38) ([id number]) Ki-67: Not repeated, previous study demonstrated 92% proliferation rate ([id number]) Non-neoplastic breast: Previous biopsy site, fibrocystic change, sclerosing adenosis and benign calcifications in benign ducts and lobules. TNM: pT1c, pN0, pMX Comments: There are a total of four distinct areas of abnormality identified. The first 1.5 cm corresponds to the invasive ductal carcinoma. The second 0.9 cm area consists of high grade ductal carcinoma in situ with associated fibrocystic change and fibroadenomatoid nodules. In this focus, the in situ carcinoma is 1 mm from the nearest lateral margin. The third 0.4 cm area consists of fat necrosis and fibrosis. There are no atypical or malignant findings in this area. The fourth and final 0.3 cm area demonstrate non-neoplastic findings to  include fibrocystic change. There are no atypical or malignant epithelial findings in this area either. 3. BREAST, INVASIVE TUMOR, WITH LYMPH NODE SAMPLING Specimen, including laterality: Left breast Procedure: Lumpectomy Grade: I of III Tubule formation: 3 Nuclear pleomorphism: 3 Mitotic: 1 Tumor size (gross measurement): 1.0 cm Margins: Invasive, distance to closest margin: Present at posterior In-situ, distance to  closest margin: Present at posterior If margin positive, focally or broadly: Broadly (invasive) and focally (in situ) Lymphovascular invasion: Present Ductal carcinoma in situ: Present Grade: I of III Extensive intraductal component: Absent Lobular neoplasia: Absent Tumor focality: Unifocal Treatment effect: None If present, treatment effect in breast tissue, lymph nodes or both: N/A Extent of tumor: Skin: N/A Nipple: N/A Skeletal muscle: N/A Lymph nodes: # examined: 1 (Part 5) Lymph nodes with metastasis: 1 Isolated tumor cells (< 0.2 mm): 0 Micrometastasis: (> 0.2 mm and < 2.0 mm): 1 Macrometastasis: (> 2.0 mm): 0 3 of 5 FINAL for ELLYANA, CRIGLER ([id number]) Microscopic Comment(continued) Extracapsular extension: Present Breast prognostic profile: Estrogen receptor: Not repeated, previous study demonstrated 100% positivity ([id number]). Progesterone receptor: Not repeated, previous study demonstrated 25% positivity ([id number]) Her 2 neu: Repeated, previous study demonstrated no amplification (1.42) ([id number]) Ki-67: Not repeated, previous study demonstrated 9% proliferation rate ([id number]) Non-neoplastic breast: Previous biopsy site, fibrocystic change and microcalcifications in benign ducts and lobules. TNM: pT1b, pN66mi, PMX Comments: The additional 1.0 cm and 1.1 cm hemorrhagic areas grossly identified consist of benign breast tissue with microcalcifications and parenchymal hemorrhage. There are no atypical or malignant findings in these  areas. 4. The surgical resection margin(s) of the specimen were inked and microscopically evaluated. There is no mass grossly identified. Representative sections demonstrate predominantly benign fibrovascular and adipose tissue with focal areas of fibrocytic change and sclerosing adenosis. There are no atypical or malignant epithelial or stromal findings identified. 5. There are microscopic foci of intranodal metastatic mammary tumor deposits spanning up to 1 mm. There is focal extracapsular extension identified. (CR:kh 11-25-12) [place] RUND DO Pathologist, Electronic Signature (Case signed 11/25/2012) Specimen Gross and Clinical Information   RADIOGRAPHIC STUDIES:  Chest 2 View  11/19/2012  *RADIOLOGY REPORT*  Clinical Data: Preop for breast cancer  CHEST - 2 VIEW  Comparison: None.  Findings: Cardiomediastinal silhouette is unremarkable.  No acute infiltrate or pleural effusion.  No pulmonary edema.  Bony thorax is unremarkable.  IMPRESSION: No active disease.   Original Report Authenticated By: Natasha Mead, M.D.    Nm Sentinel Node Inj-no Rpt (breast)  11/23/2012  CLINICAL DATA: Bilateral breast cancer   Sulfur colloid was injected intradermally by the nuclear medicine  technologist for breast cancer sentinel node localization.     Dg Chest Portable 1 View  11/23/2012  *RADIOLOGY REPORT*  Clinical Data: 52 year old female status post Port-A-Cath placement.  Breast cancer.  PORTABLE CHEST - 1 VIEW  Comparison: 11/19/2012.  Findings: Portable semi upright AP view 1441 hours.  Right chest subclavian approach Port-A-Cath placed.  Acute angulation of the catheter tubing at the level of the right axilla.  Tip at the level of the cavoatrial junction.  Interval postoperative changes to the right chest wall with subcutaneous appearing drain in place.  No pneumothorax.  No pleural effusion or pulmonary edema.  Mildly increased bibasilar opacity compatible with atelectasis.  Cardiac size and mediastinal  contours are within normal limits.  Visualized tracheal air column is within normal limits.  IMPRESSION: 1.  Right subclavian approach chest Port-A-Cath in place.  Tip at the level of the cavoatrial junction.  Mildly angulated catheter tubing at the level of the axilla. 2.  Interval postoperative changes to the right chest wall. No pneumothorax. 3.  Mild dependent atelectasis.   Original Report Authenticated By: Erskine Speed, M.D.    Dg Fluoro Guide Cv Line-no Report  11/23/2012  CLINICAL DATA: bil breast cancer   FLOURO GUIDE CV LINE  Fluoroscopy was utilized by the requesting physician.  No radiographic  interpretation.     Mm Lt Plc Breast Loc Dev   1st Lesion  Inc Mammo Guide  11/23/2012  *RADIOLOGY REPORT*  Clinical Data:  Left breast cancer  NEEDLE LOCALIZATION WITH MAMMOGRAPHIC GUIDANCE AND SPECIMEN RADIOGRAPH  Comparison:  Previous exams.  Patient presents for needle localization prior to surgery.  I met with the patient and we discussed the procedure of needle localization including benefits and alternatives. We discussed the high likelihood of a successful procedure. We discussed the risks of the procedure, including infection, bleeding, tissue injury, and further surgery. Informed, written consent was given.  Using mammographic guidance, sterile technique, 2% lidocaine and a #9 modified Kopans needle, the mass and clip in the central aspect of the left breast was localized using a superior approach.  The films are marked for Dr. Derrell Lolling.  Specimen radiograph was performed at [facility], and confirms the mass and clip were present in the tissue sample. The clip was located at the margin of the surgical specimen.  Dr.  Derrell Lolling states she had taken more tissue at the margin to clear the border.  The specimen is marked for pathology.  IMPRESSION: Needle localization of the left breast.  No apparent complications.   Original Report Authenticated By: Baird Lyons, M.D.    Mm Rt Plc Breast Loc Dev   1st  Lesion  Inc Mammo Guide  11/23/2012  *RADIOLOGY REPORT*  Clinical Data:  Known right breast cancer  NEEDLE LOCALIZATION WITH MAMMOGRAPHIC GUIDANCE AND SPECIMEN RADIOGRAPH  Comparison:  Previous exams.  Patient presents for needle localization prior to surgery.  I met with the patient and we discussed the procedure of needle localization including benefits and alternatives. We discussed the high likelihood of a successful procedure. We discussed the risks of the procedure, including infection, bleeding, tissue injury, and further surgery. Informed, written consent was given.  Using mammographic guidance, sterile technique, 2% lidocaine and a #7 and #9 modified Kopans needles, the mass and nodularity in the upper outer quadrant of the right breast was bracketedusing a lateral approach.  The films are marked for Dr. Derrell Lolling.  Specimen radiograph was performed at Day Surgery, and confirms the mass and clip are present in the tissue sample.  The specimen is marked for pathology.  IMPRESSION: Needle localization of the right breast.  No apparent complications.   Original Report Authenticated By: Baird Lyons, M.D.     ASSESSMENT: 52 year old female with  #1 bilateral breast cancers triple negative on the right side ER positive on the left side. She is status post bilateral lumpectomies. Clinically she's doing well. She will receive Adriamycin Cytoxan given dose dense x4 cycles followed by Taxol carbo x12 cycles.  Rationale risks benefits and side effects were discussed.  Patient did develop febrile neutropenia after first cycle, should it recur, a dose reduction will be considered.   #2 patient has had an echocardiogram Port-A-Cath placed and chemotherapy teaching class.   PLAN:   #1 Doing well. She is subsequently neutropenic following cycle 3 of chemotherapy.  I reviewed her medications with her, and we discussed neutropenic precautions in detail.  She will begin Cipro tonight.  Written neutropenic precautions  were given to her in her AVS.    2. Due to the mild hypotension and tachycardia, she is likely dehydrated after chemotherapy.  We will give her one liter of IV fluids today.    3. For her feet she will continue to moisturize  twice a day at minimum.  Should the pain worsen, or the skin begin to break down or erythema worsen, she should contact us immediately.    4. She will see [place] back in 1 week for interim labs and office visit, and cycle 4 of chemotherapy.  All questions were answered. The patient knows to call the clinic with any problems, questions or concerns. We can certainly see the patient much sooner if necessary.  I spent 25 minutes counseling the patient face to face. The total time spent in the appointment was 30 minutes.  Cherie Ouch Lyn Hollingshead, NP Medical Oncology [facility] Phone: [phone] 02/03/2013, 12:52 PM

## 2013-02-03 NOTE — Telephone Encounter (Signed)
per Amy add IVF section I, 12:30pm for 2hrs

## 2013-02-03 NOTE — Patient Instructions (Signed)
  Patient Neutropenia Instruction Sheet  Diagnosis: Breast Cancer      Treating Physician: Drue Second, MD  Treatment: 1. Type of chemotherapy: A/C 2. Date of last treatment: 01/27/13  Last Blood Counts: Lab Results  Component Value Date   WBC 0.5* 02/03/2013   HGB 10.9* 02/03/2013   HCT 31.1* 02/03/2013   MCV 92.2 02/03/2013   PLT 118* 02/03/2013   ANC 0     Prophylactic Antibiotics: Cipro 500 mg by mouth twice a day Instructions: 1. Monitor temperature and call if fever  greater than 100.5, chills, shaking chills (rigors) 2. Call Physician on-call at [phone] 3. Give him/her symptoms and list of medications that you are taking and your last blood count.

## 2013-02-10 ENCOUNTER — Other Ambulatory Visit (HOSPITAL_BASED_OUTPATIENT_CLINIC_OR_DEPARTMENT_OTHER): Payer: BC Managed Care – PPO | Admitting: Lab

## 2013-02-10 ENCOUNTER — Telehealth: Payer: Self-pay | Admitting: *Deleted

## 2013-02-10 ENCOUNTER — Encounter: Payer: Self-pay | Admitting: Adult Health

## 2013-02-10 ENCOUNTER — Ambulatory Visit (HOSPITAL_BASED_OUTPATIENT_CLINIC_OR_DEPARTMENT_OTHER): Payer: BC Managed Care – PPO | Admitting: Adult Health

## 2013-02-10 ENCOUNTER — Ambulatory Visit: Payer: BC Managed Care – PPO

## 2013-02-10 VITALS — BP 128/79 | HR 98 | Temp 97.8°F | Resp 20 | Ht 63.0 in | Wt 193.5 lb

## 2013-02-10 DIAGNOSIS — C50411 Malignant neoplasm of upper-outer quadrant of right female breast: Secondary | ICD-10-CM

## 2013-02-10 DIAGNOSIS — C50419 Malignant neoplasm of upper-outer quadrant of unspecified female breast: Secondary | ICD-10-CM

## 2013-02-10 DIAGNOSIS — C50919 Malignant neoplasm of unspecified site of unspecified female breast: Secondary | ICD-10-CM

## 2013-02-10 LAB — CBC WITH DIFFERENTIAL/PLATELET
Basophils Absolute: 0.1 10*3/uL (ref 0.0–0.1)
Eosinophils Absolute: 0 10*3/uL (ref 0.0–0.5)
HGB: 9.7 g/dL — ABNORMAL LOW (ref 11.6–15.9)
MONO#: 1.3 10*3/uL — ABNORMAL HIGH (ref 0.1–0.9)
NEUT#: 6.5 10*3/uL (ref 1.5–6.5)
RDW: 15 % — ABNORMAL HIGH (ref 11.2–14.5)
lymph#: 1.4 10*3/uL (ref 0.9–3.3)

## 2013-02-10 MED ORDER — ATIVAN 0.5 MG PO TABS: 0.5000 mg | ORAL_TABLET | Freq: Four times a day (QID) | ORAL | Status: DC | PRN

## 2013-02-10 NOTE — Patient Instructions (Addendum)
Doing well.  We will hold treatment today and re-evaluate next week.  Please call us if you have any questions or concerns.

## 2013-02-10 NOTE — Progress Notes (Signed)
OFFICE PROGRESS NOTE  CC  Amy Manns, MD [address] [place] [address], [place] [address] Dr. Claud Kelp  Dr. Antony Blackbird  DIAGNOSIS: 52 year old female with new diagnosis of bilateral breast cancers.   STAGE:  Cancer of upper-outer quadrant of female breast  Primary site: Breast (Bilateral)  Staging method: AJCC 7th Edition  Clinical: Stage IA (T1c, N0, cM0)  Summary: Stage IA (T1c, N0, cM0)   PRIOR THERAPY: #1Patient recently underwent screening mammograms and she was found to have bilateral breast abnormalities.diagnostic mammogram showed in the right breast an ill-defined 2 cm mass with a few associated well defined microcalcifications over the upper outer quadrant. Spot compression images of the left breast demonstrated an ill defined spiculated 8 mm mass centrally. Ultrasound showed irregular bordered heterogeneous hypoechoic solid mass at the 10:00 position of the right breast 6 cm from the nipple measuring 1.1 x 1.3 x 1.4 cm. Also in this location was an adjacent smaller irregular hypoechoic mass measuring 3 x 5 x 6 mm. The smaller mass was 1 cm from the large more irregular mass. Ultrasound of the axilla demonstrated single lymph node with thickened cortex. Ultrasound of the left breast demonstrated an ill-defined hypoechoic mass with distal acoustic shadowing 7 to 8:00 position 3 cm from the nipple located deep and measuring 5 x 8 x 8 mm ultrasound of the left axilla showed normal appearing lymph nodes.   #2Patient went on to have needle core biopsies performed of both of the masses. 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 The tumor was ER positive PR positive with a Ki-67 of 9% the second tumor was ER negative PR negative HER-2/neu negative with Ki-6792% and elevated.  #3 Patient had MRIs of the breasts performed. There was  noted to be 1.7 cm irregular enhancing mass located within the upper-outer quadrant of the right breast with 3 adjacent worrisome enhancing satellite nodules in aggregate the nodules and mass measures 3.8 cm. In the left breast 8 mm irregular enhancing mass located within the central left breast was noted. No evidence of axillary or internal mammary adenopathy.  #4patient is status post bilateral lumpectomies. The right breast revealed a 1.5 cm grade 3 invasive ductal carcinoma ER/PR negative HER-2/neu negative Ki-67 92%. Sentinel node was negative. Left breast showed 1.0 cm grade 3 invasive ductal carcinoma ER/PR positive HER-2/neu negative sentinel node was negative.  #5 patient is to begin adjuvant chemotherapy consisting of Adriamycin Cytoxan every 2 weeks for a total of 4 cycles 12/30/12. This would then be followed by Taxol and carboplatinum weekly for 12 weeks. Patient will need adjuvant radiation therapy. Because one of the tumor is ER positive she will also receive antiestrogen therapy.  CURRENT THERAPY:cycle 3 day 8 of adjuvant dose dense AC  INTERVAL HISTORY: Amy Leonard 52 y.o. female returns for followup visit prior to her fourth visit for adriamycin/cytoxan.  She is feeling well today.  She still has some erythema and pain in her feet, but otherwise she is feeling well.  She denies fevers, chills, nausea, vomiting, constipation, diarrhea, numbness, pain, or any other concerns.  A 10 point ROS is negative.    MEDICAL HISTORY: Past Medical History  Diagnosis Date  . Elevated WBC count     nl diff  . Chronic back pain     spinal stenosis  . Tobacco abuse   .  Anxiety and depression   . HSV infection     recurrent (side and buttox)  . Migraine   . Glaucoma     ?  . Carpal tunnel syndrome   . HLD (hyperlipidemia)   . Folliculitis 05/05/2011  . Anxiety   . Depression   . 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 GERD (gastroesophageal reflux disease)   . Hypertension   . Breast  cancer   . Wears dentures     top    ALLERGIES:  is allergic to bupropion hcl; chlorhexidine gluconate; hydrocod polst-cpm polst er; lisinopril; and naproxen sodium.  MEDICATIONS:  Current Outpatient Prescriptions  Medication Sig Dispense Refill  . Acetaminophen (TYLENOL ARTHRITIS PAIN PO) Take 2 tablets by mouth as needed (pain).       Marland Kitchen acyclovir (ZOVIRAX) 5 % ointment Apply 1 application topically as needed (flare-up).       Marland Kitchen aspirin 81 MG chewable tablet Chew 81 mg by mouth daily.      Marland Kitchen atorvastatin (LIPITOR) 10 MG tablet Take 1 tablet (10 mg total) by mouth daily.  90 tablet  1  . calcium carbonate (TUMS - DOSED IN MG ELEMENTAL CALCIUM) 500 MG chewable tablet Chew by mouth as directed.        . ciprofloxacin (CIPRO) 500 MG tablet Take 1 tablet (500 mg total) by mouth 2 (two) times daily.  14 tablet  3  . COMPRO 25 MG suppository       . cyclobenzaprine (FLEXERIL) 10 MG tablet Take 10 mg by mouth as needed (muscle spasm).       Marland Kitchen dexamethasone (DECADRON) 4 MG tablet Take 2 tablets by mouth once a day on the day after chemotherapy and then take 2 tablets two times a day for 2 days. Take with food.  30 tablet  1  . fluconazole (DIFLUCAN) 100 MG tablet Take 2 tablets (200 mg total) by mouth daily.  14 tablet  0  . lidocaine-prilocaine (EMLA) cream Apply topically as needed.  30 g  8  . losartan (COZAAR) 50 MG tablet Take 1 tablet (50 mg total) by mouth daily.  90 tablet  2  . nystatin (MYCOSTATIN) 100000 UNIT/ML suspension Take 5 mLs (500,000 Units total) by mouth 2 (two) times daily.  240 mL  3  . omeprazole (PRILOSEC) 40 MG capsule Take 1 capsule (40 mg total) by mouth 2 (two) times daily.  180 capsule  3  . ondansetron (ZOFRAN) 8 MG tablet Take 1 tablet (8 mg total) by mouth 2 (two) times daily as needed. Take two times a day as needed for nausea or vomiting starting on the third day after chemotherapy.  30 tablet  1  . prochlorperazine (COMPAZINE) 10 MG tablet Take 1 tablet (10 mg  total) by mouth every 6 (six) hours as needed (Nausea or vomiting).  30 tablet  1  . simethicone (MYLICON) 80 MG chewable tablet Chew 1 tablet (80 mg total) by mouth every 8 (eight) hours as needed for flatulence.  60 tablet  3  . ATIVAN 0.5 MG tablet Take 1 tablet (0.5 mg total) by mouth every 6 (six) hours as needed (Nausea or vomiting).  45 tablet  0   No current facility-administered medications for this visit.    SURGICAL HISTORY:  Past Surgical History  Procedure Laterality Date  . Btl    . Epidural steroid injection  2008  . Colonoscopy    . Upper gi endoscopy    . Partial mastectomy with needle  localization and axillary sentinel lymph node bx Bilateral 11/23/2012    Procedure: PARTIAL MASTECTOMY WITH NEEDLE LOCALIZATION AND AXILLARY SENTINEL LYMPH NODE BX;  Surgeon: Ernestene Mention, MD;  Location: [facility];  Service: General;  Laterality: Bilateral;  . Portacath placement Right 11/23/2012    Procedure: INSERTION PORT-A-CATH;  Surgeon: Ernestene Mention, MD;  Location: [facility];  Service: General;  Laterality: Right;    REVIEW OF SYSTEMS:  Pertinent items are noted in HPI.   PHYSICAL EXAMINATION: Blood pressure 128/79, pulse 98, temperature 97.8 F (36.6 C), temperature source Oral, resp. rate 20, height 5\' 3"  (1.6 m), weight 193 lb 8 oz (87.771 kg). Body mass index is 34.29 kg/(m^2). General: Patient is a well appearing female in no acute distress HEENT: PERRLA, sclerae anicteric no conjunctival pallor, MMM Neck: supple, no palpable adenopathy Lungs: clear to auscultation bilaterally, no wheezes, rhonchi, or rales Cardiovascular: regular rhythm, S1, S2, no murmurs, rubs or gallops, slightly tachycardic at 110 Abdomen: Soft, non-tender, non-distended, normoactive bowel sounds, no HSM Extremities: warm and well perfused, no clubbing, cyanosis, or edema, entire soles of feet are erythematous-improved, no skin breakdown noted.  Her fingertips have no  noted abnormality Skin: No rashes or lesions Neuro: Non-focal Breasts: Bilateral lumpectomy sites without nodularity, no masses or skin changes in either breast. ECOG PERFORMANCE STATUS: 0 - Asymptomatic  LABORATORY DATA: Lab Results  Component Value Date   WBC 9.3 02/10/2013   HGB 9.7* 02/10/2013   HCT 29.1* 02/10/2013   MCV 93.0 02/10/2013   PLT 252 02/10/2013      Chemistry      Component Value Date/Time   NA 137 02/03/2013 1106   NA 135 01/08/2013 0555   K 3.6 02/03/2013 1106   K 4.0 01/08/2013 0555   CL 104 02/03/2013 1106   CL 100 01/08/2013 0555   CO2 24 02/03/2013 1106   CO2 28 01/08/2013 0555   BUN 22.2 02/03/2013 1106   BUN 10 01/08/2013 0555   CREATININE 0.7 02/03/2013 1106   CREATININE 0.62 01/08/2013 0555      Component Value Date/Time   CALCIUM 8.9 02/03/2013 1106   CALCIUM 9.1 01/08/2013 0555   ALKPHOS 79 02/03/2013 1106   ALKPHOS 67 01/05/2013 2245   AST 7 02/03/2013 1106   AST 10 01/05/2013 2245   ALT 13 02/03/2013 1106   ALT 15 01/05/2013 2245   BILITOT 0.58 02/03/2013 1106   BILITOT 0.5 01/05/2013 2245    ADDITIONAL INFORMATION: 1. CHROMOGENIC IN-SITU HYBRIDIZATION Results: HER-2/NEU BY CISH - NO AMPLIFICATION OF HER-2 DETECTED. RESULT RATIO OF HER2: CEP 17 SIGNALS 1.00 AVERAGE HER2 COPY NUMBER PER CELL 1.55 REFERENCE RANGE NEGATIVE HER2/Chr17 Ratio <2.0 and Average HER2 copy number <4.0 EQUIVOCAL HER2/Chr17 Ratio <2.0 and Average HER2 copy number 4.0 and <6.0 POSITIVE HER2/Chr17 Ratio >=2.0 and/or Average HER2 copy number >=6.0 Pecola Leisure MD Pathologist, Electronic Signature ( Signed 12/01/2012) 3. CHROMOGENIC IN-SITU HYBRIDIZATION Results: HER-2/NEU BY CISH - NO AMPLIFICATION OF HER-2 DETECTED. RESULT RATIO OF HER2: CEP 17 SIGNALS 1.22 AVERAGE HER2 COPY NUMBER PER CELL 1.40 REFERENCE RANGE NEGATIVE HER2/Chr17 Ratio <2.0 and Average HER2 copy number <4.0 EQUIVOCAL HER2/Chr17 Ratio <2.0 and Average HER2 copy number 4.0 and <6.0 POSITIVE HER2/Chr17 Ratio  >=2.0 and/or Average HER2 copy number >=6.0 1 of 5 FINAL for HILLARY, STRUSS ([id number]) ADDITIONAL INFORMATION:(continued) Pecola Leisure MD Pathologist, Electronic Signature ( Signed 12/01/2012) FINAL DIAGNOSIS Diagnosis 1. Breast, lumpectomy, Right - INVASIVE DUCTAL CARCINOMA, GRADE III, SEE COMMENT. -  INVASIVE CARCINOMA IS 0.9 CM FROM NEAREST MARGIN (INFERIOR). - NO LYMPHOVASCULAR INVASION IDENTIFIED. - HIGH GRADE DUCTAL CARCINOMA IN SITU - IN SITU CARCINOMA IS 1 MM FROM THE NEAREST LATERAL MARGIN - SEE TUMOR SYNOPTIC TEMPLATE BELOW. 2. Lymph nodes, regional resection, Right axilla - ELEVEN LYMPH NODES, NEGATIVE FOR TUMOR (0/11). 3. Breast, lumpectomy, Left - INVASIVE DUCTAL CARCINOMA, GRADE I, SEE COMMENT. - LYMPHOVASCULAR INVASION IDENTIFIED - INVASIVE CARCINOMA IS BROADLY PRESENT AT POSTERIOR MARGIN - DUCTAL CARCINOMA IN SITU, GRADE I. - IN SITU CARCINOMA IS FOCALLY PRESENT AT POSTERIOR MARGIN - SEE TUMOR SYNOPTIC TEMPLATE BELOW. 4. Breast, excision, Left - BENIGN BREAST TISSUE, SEE COMMENT. - NEGATIVE FOR ATYPIA OR MALIGNANCY. - SURGICAL MARGIN, NEGATIVE FOR ATYPIA OR MALIGNANCY. 5. Lymph node, sentinel, biopsy, Left axillary - ONE LYMPH NODE, POSITIVE FOR METASTATIC MAMMARY CARCINOMA (1/1). Microscopic Comment 1. BREAST, INVASIVE TUMOR, WITH LYMPH NODE SAMPLING Specimen, including laterality: Right breast Procedure: Lumpectomy Grade: III of III Tubule formation: 3 Nuclear pleomorphism: 3 Mitotic: 3 Tumor size (gross measurement: 1.5 cm Margins: Invasive, distance to closest margin: 0.9 cm In-situ, distance to closest margin: 0.1 cm If margin positive, focally or broadly: N/A Lymphovascular invasion: Absent Ductal carcinoma in situ: Present Grade: III of III Extensive intraductal component: Absent Lobular neoplasia: Absent Tumor focality: Multifocal Treatment effect: None 2 of 5 FINAL for DENETTE, HASS ([id number]) Microscopic Comment(continued) If  present, treatment effect in breast tissue, lymph nodes or both: N/A Extent of tumor: Skin: N/A Nipple: N/A Skeletal muscle: N/A Lymph nodes: # examined: 11 (Part 2) Lymph nodes with metastasis: 0 Breast prognostic profile: Estrogen receptor: Repeated, previous study demonstrated 0% positivity ([id number]) Progesterone receptor: Repeated, previous study demonstrated 0% positivity ([id number]) Her 2 neu: Repeated, previous study demonstrated no amplification (1.38) ([id number]) Ki-67: Not repeated, previous study demonstrated 92% proliferation rate ([id number]) Non-neoplastic breast: Previous biopsy site, fibrocystic change, sclerosing adenosis and benign calcifications in benign ducts and lobules. TNM: pT1c, pN0, pMX Comments: There are a total of four distinct areas of abnormality identified. The first 1.5 cm corresponds to the invasive ductal carcinoma. The second 0.9 cm area consists of high grade ductal carcinoma in situ with associated fibrocystic change and fibroadenomatoid nodules. In this focus, the in situ carcinoma is 1 mm from the nearest lateral margin. The third 0.4 cm area consists of fat necrosis and fibrosis. There are no atypical or malignant findings in this area. The fourth and final 0.3 cm area demonstrate non-neoplastic findings to include fibrocystic change. There are no atypical or malignant epithelial findings in this area either. 3. BREAST, INVASIVE TUMOR, WITH LYMPH NODE SAMPLING Specimen, including laterality: Left breast Procedure: Lumpectomy Grade: I of III Tubule formation: 3 Nuclear pleomorphism: 3 Mitotic: 1 Tumor size (gross measurement): 1.0 cm Margins: Invasive, distance to closest margin: Present at posterior In-situ, distance to closest margin: Present at posterior If margin positive, focally or broadly: Broadly (invasive) and focally (in situ) Lymphovascular invasion: Present Ductal carcinoma in situ: Present Grade: I of III Extensive  intraductal component: Absent Lobular neoplasia: Absent Tumor focality: Unifocal Treatment effect: None If present, treatment effect in breast tissue, lymph nodes or both: N/A Extent of tumor: Skin: N/A Nipple: N/A Skeletal muscle: N/A Lymph nodes: # examined: 1 (Part 5) Lymph nodes with metastasis: 1 Isolated tumor cells (< 0.2 mm): 0 Micrometastasis: (> 0.2 mm and < 2.0 mm): 1 Macrometastasis: (> 2.0 mm): 0 3 of 5 FINAL for NOVALYNN, BRANAMAN ([id number]) Microscopic Comment(continued) Extracapsular extension: Present Breast prognostic profile: Estrogen receptor:  Not repeated, previous study demonstrated 100% positivity ([id number]). Progesterone receptor: Not repeated, previous study demonstrated 25% positivity ([id number]) Her 2 neu: Repeated, previous study demonstrated no amplification (1.42) ([id number]) Ki-67: Not repeated, previous study demonstrated 9% proliferation rate ([id number]) Non-neoplastic breast: Previous biopsy site, fibrocystic change and microcalcifications in benign ducts and lobules. TNM: pT1b, pN89mi, PMX Comments: The additional 1.0 cm and 1.1 cm hemorrhagic areas grossly identified consist of benign breast tissue with microcalcifications and parenchymal hemorrhage. There are no atypical or malignant findings in these areas. 4. The surgical resection margin(s) of the specimen were inked and microscopically evaluated. There is no mass grossly identified. Representative sections demonstrate predominantly benign fibrovascular and adipose tissue with focal areas of fibrocytic change and sclerosing adenosis. There are no atypical or malignant epithelial or stromal findings identified. 5. There are microscopic foci of intranodal metastatic mammary tumor deposits spanning up to 1 mm. There is focal extracapsular extension identified. (CR:kh 11-25-12) [place] RUND DO Pathologist, Electronic Signature (Case signed 11/25/2012) Specimen Gross and Clinical  Information   RADIOGRAPHIC STUDIES:  Chest 2 View  11/19/2012  *RADIOLOGY REPORT*  Clinical Data: Preop for breast cancer  CHEST - 2 VIEW  Comparison: None.  Findings: Cardiomediastinal silhouette is unremarkable.  No acute infiltrate or pleural effusion.  No pulmonary edema.  Bony thorax is unremarkable.  IMPRESSION: No active disease.   Original Report Authenticated By: Natasha Mead, M.D.    Nm Sentinel Node Inj-no Rpt (breast)  11/23/2012  CLINICAL DATA: Bilateral breast cancer   Sulfur colloid was injected intradermally by the nuclear medicine  technologist for breast cancer sentinel node localization.     Dg Chest Portable 1 View  11/23/2012  *RADIOLOGY REPORT*  Clinical Data: 52 year old female status post Port-A-Cath placement.  Breast cancer.  PORTABLE CHEST - 1 VIEW  Comparison: 11/19/2012.  Findings: Portable semi upright AP view 1441 hours.  Right chest subclavian approach Port-A-Cath placed.  Acute angulation of the catheter tubing at the level of the right axilla.  Tip at the level of the cavoatrial junction.  Interval postoperative changes to the right chest wall with subcutaneous appearing drain in place.  No pneumothorax.  No pleural effusion or pulmonary edema.  Mildly increased bibasilar opacity compatible with atelectasis.  Cardiac size and mediastinal contours are within normal limits.  Visualized tracheal air column is within normal limits.  IMPRESSION: 1.  Right subclavian approach chest Port-A-Cath in place.  Tip at the level of the cavoatrial junction.  Mildly angulated catheter tubing at the level of the axilla. 2.  Interval postoperative changes to the right chest wall. No pneumothorax. 3.  Mild dependent atelectasis.   Original Report Authenticated By: Erskine Speed, M.D.    Dg Fluoro Guide Cv Line-no Report  11/23/2012  CLINICAL DATA: bil breast cancer   FLOURO GUIDE CV LINE  Fluoroscopy was utilized by the requesting physician.  No radiographic  interpretation.     Mm Lt Plc  Breast Loc Dev   1st Lesion  Inc Mammo Guide  11/23/2012  *RADIOLOGY REPORT*  Clinical Data:  Left breast cancer  NEEDLE LOCALIZATION WITH MAMMOGRAPHIC GUIDANCE AND SPECIMEN RADIOGRAPH  Comparison:  Previous exams.  Patient presents for needle localization prior to surgery.  I met with the patient and we discussed the procedure of needle localization including benefits and alternatives. We discussed the high likelihood of a successful procedure. We discussed the risks of the procedure, including infection, bleeding, tissue injury, and further surgery. Informed, written consent was given.  Using mammographic  guidance, sterile technique, 2% lidocaine and a #9 modified Kopans needle, the mass and clip in the central aspect of the left breast was localized using a superior approach.  The films are marked for Dr. Derrell Lolling.  Specimen radiograph was performed at [facility], and confirms the mass and clip were present in the tissue sample. The clip was located at the margin of the surgical specimen.  Dr.  Derrell Lolling states she had taken more tissue at the margin to clear the border.  The specimen is marked for pathology.  IMPRESSION: Needle localization of the left breast.  No apparent complications.   Original Report Authenticated By: Baird Lyons, M.D.    Mm Rt Plc Breast Loc Dev   1st Lesion  Inc Mammo Guide  11/23/2012  *RADIOLOGY REPORT*  Clinical Data:  Known right breast cancer  NEEDLE LOCALIZATION WITH MAMMOGRAPHIC GUIDANCE AND SPECIMEN RADIOGRAPH  Comparison:  Previous exams.  Patient presents for needle localization prior to surgery.  I met with the patient and we discussed the procedure of needle localization including benefits and alternatives. We discussed the high likelihood of a successful procedure. We discussed the risks of the procedure, including infection, bleeding, tissue injury, and further surgery. Informed, written consent was given.  Using mammographic guidance, sterile technique, 2% lidocaine and a #7  and #9 modified Kopans needles, the mass and nodularity in the upper outer quadrant of the right breast was bracketedusing a lateral approach.  The films are marked for Dr. Derrell Lolling.  Specimen radiograph was performed at Day Surgery, and confirms the mass and clip are present in the tissue sample.  The specimen is marked for pathology.  IMPRESSION: Needle localization of the right breast.  No apparent complications.   Original Report Authenticated By: Baird Lyons, M.D.     ASSESSMENT: 52 year old female with  #1 bilateral breast cancers triple negative on the right side ER positive on the left side. She is status post bilateral lumpectomies. Clinically she's doing well. She will receive Adriamycin Cytoxan given dose dense x4 cycles followed by Taxol carbo x12 cycles.  Rationale risks benefits and side effects were discussed.  Patient did develop febrile neutropenia after first cycle, should it recur, a dose reduction will be considered.   #2 patient has had an echocardiogram Port-A-Cath placed and chemotherapy teaching class.   PLAN:   #1 Doing well. Due to the fact that she continues to have residual pain and erythema on her feet, we will hold chemotherapy today.  This is likely mild hand-foot and will give her another week of rest.  She will continue to moisturize her feet as she has been doing.    2. She will see [place] back in 1 week for interim labs and office visit, and cycle 4 of chemotherapy.  All questions were answered. The patient knows to call the clinic with any problems, questions or concerns. We can certainly see the patient much sooner if necessary.  I spent 25 minutes counseling the patient face to face. The total time spent in the appointment was 30 minutes.  Cherie Ouch Lyn Hollingshead, NP [facility] Phone: [phone] 02/10/2013, 2:47 PM

## 2013-02-10 NOTE — Telephone Encounter (Signed)
appts made and printed...td 

## 2013-02-10 NOTE — Telephone Encounter (Signed)
Per staff phone call and POF I have schedueld appts.  JMW  

## 2013-02-11 ENCOUNTER — Ambulatory Visit (INDEPENDENT_AMBULATORY_CARE_PROVIDER_SITE_OTHER): Payer: BC Managed Care – PPO | Admitting: General Surgery

## 2013-02-11 ENCOUNTER — Ambulatory Visit: Payer: BC Managed Care – PPO

## 2013-02-11 ENCOUNTER — Encounter (INDEPENDENT_AMBULATORY_CARE_PROVIDER_SITE_OTHER): Payer: Self-pay | Admitting: General Surgery

## 2013-02-11 VITALS — BP 130/72 | HR 72 | Temp 98.7°F | Resp 18 | Ht 63.0 in | Wt 194.5 lb

## 2013-02-11 DIAGNOSIS — C50919 Malignant neoplasm of unspecified site of unspecified female breast: Secondary | ICD-10-CM

## 2013-02-11 DIAGNOSIS — C50912 Malignant neoplasm of unspecified site of left female breast: Secondary | ICD-10-CM

## 2013-02-11 NOTE — Progress Notes (Signed)
Patient ID: Amy Leonard, female   DOB: 21-Aug-1961, 52 y.o.   MRN: [id number] History:   This patient underwent bilateral partial mastectomy with needle localization, right axillary lymph node dissection, left axillary sentinel node biopsy, and Port-A-Cath insertion on 11/23/2012.  On the right side she has invasive ductal carcinoma, grade 3, triple negative breast cancer. In situ component is 1 mm from the lateral margin. Pathologic stage T1c., N0, triple negative breast cancer. Right axilla dissection had to be done because the blue dye and radionuclide did not map into the lymph nodes.  On the left side she had invasive ductal carcinoma, grade 1, receptor positive, HER-2-negative. There was tumor at the posterior margin which was recognized, and the reexcised margin is negative. 1/1 sentinel nodes was positive for microscopic metastatic disease. She is doing reasonably well with her chemotherapy. She thinks that her chemotherapy will be completed in September of this year and that she will receive radiation therapy thereafter. She has no specific complaints about her breasts. She has full range of motion of her arms. A little bit of numbness of the right arm. No arm swelling.  ROS: 10 system review of systems is negative except as described above Social history, family history reviewed and unchanged.  Exam: Patient looks well. No distress. Neck no adenopathy or mass. No jugular venous distention Heart regular rate and rhythm. No murmur or ectopy Lungs clear to auscultation bilaterally Breast: Radially oriented scar right breast at 9:00 and right axillar scar well healed. No other skin changes. No mass. No seroma. Very good contour and cosmesis was minimal defect. Left breast reveals lumpectomy and x-ray incision well-healed. Slight thickening of the skin and dermis. Otherwise no mass no axillary adenopathy. Good result.  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 Close but negative negative DCIS margin laterally. Recovering uneventfully following right partial mastectomy and right axillary lymph node dissection.  Invasive ductal carcinoma left breast, receptor positive, HER-2-negative, resected to negative margins. Pathologic stage T1b, N100mi, . Recovering uneventfully following left partial mastectomy and sentinel lymph biopsy.  Status post Port-A-Cath insertion, no apparent complications.   Plan: I told her to call me to schedule Port-A-Cath removal once Dr. Welton Flakes no longer needs the port. Plan bilateral mammograms in March, 2015 Return to see me in April 2015, sooner if there are any problems.   Angelia Mould. Derrell Lolling, M.D., [facility] General and Minimally invasive Surgery Breast and Colorectal Surgery Office:   [phone] Pager:   [phone]

## 2013-02-11 NOTE — Patient Instructions (Signed)
Examination of her breasts, and lymph node areas are normal. There is no evidence of recurrent cancer. You appeared to have healed all of her wounds well.  Continue the chemotherapy, and also proceed with radiation therapy as planned this fall.  Call Dr. Derrell Lolling to schedule removal of the Port-A-Cath once Dr. Drue Second tells you that she no longer needs the port.  You should get bilateral mammograms in March, 2015.  Return to see Dr. Derrell Lolling in April 2015, after the mammograms are done

## 2013-02-14 NOTE — Progress Notes (Signed)
OFFICE PROGRESS NOTE  CC  Amy Manns, MD [address] [place] [address] [place] [address] Dr. Claud Kelp  Dr. Antony Blackbird  DIAGNOSIS: 52 year old female with new diagnosis of bilateral breast cancers.   STAGE:  Cancer of upper-outer quadrant of female breast  Primary site: Breast (Bilateral)  Staging method: AJCC 7th Edition  Clinical: Stage IA (T1c, N0, cM0)  Summary: Stage IA (T1c, N0, cM0)   PRIOR THERAPY: #1Patient recently underwent screening mammograms and she was found to have bilateral breast abnormalities.diagnostic mammogram showed in the right breast an ill-defined 2 cm mass with a few associated well defined microcalcifications over the upper outer quadrant. Spot compression images of the left breast demonstrated an ill defined spiculated 8 mm mass centrally. Ultrasound showed irregular bordered heterogeneous hypoechoic solid mass at the 10:00 position of the right breast 6 cm from the nipple measuring 1.1 x 1.3 x 1.4 cm. Also in this location was an adjacent smaller irregular hypoechoic mass measuring 3 x 5 x 6 mm. The smaller mass was 1 cm from the large more irregular mass. Ultrasound of the axilla demonstrated single lymph node with thickened cortex. Ultrasound of the left breast demonstrated an ill-defined hypoechoic mass with distal acoustic shadowing 7 to 8:00 position 3 cm from the nipple located deep and measuring 5 x 8 x 8 mm ultrasound of the left axilla showed normal appearing lymph nodes.   #2Patient went on to have needle core biopsies performed of both of the masses. 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 The tumor was ER positive PR positive with a Ki-67 of 9% the second tumor was ER negative PR negative HER-2/neu negative with Ki-6792% and elevated.  #3 Patient had MRIs of the breasts performed. There was  noted to be 1.7 cm irregular enhancing mass located within the upper-outer quadrant of the right breast with 3 adjacent worrisome enhancing satellite nodules in aggregate the nodules and mass measures 3.8 cm. In the left breast 8 mm irregular enhancing mass located within the central left breast was noted. No evidence of axillary or internal mammary adenopathy.  #4patient is status post bilateral lumpectomies. The right breast revealed a 1.5 cm grade 3 invasive ductal carcinoma ER/PR negative HER-2/neu negative Ki-67 92%. Sentinel node was negative. Left breast showed 1.0 cm grade 3 invasive ductal carcinoma ER/PR positive HER-2/neu negative sentinel node was negative.  #5 patient is to begin adjuvant chemotherapy consisting of Adriamycin Cytoxan every 2 weeks for a total of 4 cycles. This would then be followed by Taxol and carboplatinum weekly for 12 weeks. Patient will need adjuvant radiation therapy. Because one of the tumor is ER positive she will also receive antiestrogen therapy.  CURRENT THERAPY:cycle 2 day 8 of adjuvant dose dense AC.  INTERVAL HISTORY: Amy Leonard 52 y.o. female returns for followup visit Following cycle two of her adjuvant Adriamycin/Cytoxan.  She is feeling the side effects of her chemotherapy. She is neutropenic today with a white count of 0.8. She is weak tired fatigued she does have some peripheral paresthesias but no rashes. No nausea or vomiting no abdominal pain she did have some diarrhea. Remainder of the 10 point review of systems is negative.  MEDICAL HISTORY: Past Medical History  Diagnosis Date  . Elevated WBC count     nl diff  . Chronic back pain  spinal stenosis  . Tobacco abuse   . Anxiety and depression   . HSV infection     recurrent (side and buttox)  . Migraine   . Glaucoma     ?  . Carpal tunnel syndrome   . HLD (hyperlipidemia)   . Folliculitis 05/05/2011  . Anxiety   . Depression   . 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 GERD  (gastroesophageal reflux disease)   . Hypertension   . Breast cancer   . Wears dentures     top    ALLERGIES:  is allergic to bupropion hcl; chlorhexidine gluconate; hydrocod polst-cpm polst er; lisinopril; and naproxen sodium.  MEDICATIONS:  Current Outpatient Prescriptions  Medication Sig Dispense Refill  . Acetaminophen (TYLENOL ARTHRITIS PAIN PO) Take 2 tablets by mouth as needed (pain).       Marland Kitchen acyclovir (ZOVIRAX) 5 % ointment Apply 1 application topically as needed (flare-up).       Marland Kitchen aspirin 81 MG chewable tablet Chew 81 mg by mouth daily.      Marland Kitchen atorvastatin (LIPITOR) 10 MG tablet Take 1 tablet (10 mg total) by mouth daily.  90 tablet  1  . calcium carbonate (TUMS - DOSED IN MG ELEMENTAL CALCIUM) 500 MG chewable tablet Chew by mouth as directed.        Marland Kitchen losartan (COZAAR) 50 MG tablet Take 1 tablet (50 mg total) by mouth daily.  90 tablet  2  . omeprazole (PRILOSEC) 40 MG capsule Take 1 capsule (40 mg total) by mouth 2 (two) times daily.  180 capsule  3  . ondansetron (ZOFRAN) 8 MG tablet Take 1 tablet (8 mg total) by mouth 2 (two) times daily as needed. Take two times a day as needed for nausea or vomiting starting on the third day after chemotherapy.  30 tablet  1  . simethicone (MYLICON) 80 MG chewable tablet Chew 1 tablet (80 mg total) by mouth every 8 (eight) hours as needed for flatulence.  60 tablet  3  . ATIVAN 0.5 MG tablet Take 1 tablet (0.5 mg total) by mouth every 6 (six) hours as needed (Nausea or vomiting).  45 tablet  0  . ciprofloxacin (CIPRO) 500 MG tablet Take 1 tablet (500 mg total) by mouth 2 (two) times daily.  14 tablet  3  . COMPRO 25 MG suppository       . cyclobenzaprine (FLEXERIL) 10 MG tablet Take 10 mg by mouth as needed (muscle spasm).       Marland Kitchen dexamethasone (DECADRON) 4 MG tablet Take 2 tablets by mouth once a day on the day after chemotherapy and then take 2 tablets two times a day for 2 days. Take with food.  30 tablet  1  . fluconazole (DIFLUCAN) 100  MG tablet Take 2 tablets (200 mg total) by mouth daily.  14 tablet  0  . lidocaine-prilocaine (EMLA) cream Apply topically as needed.  30 g  8  . nystatin (MYCOSTATIN) 100000 UNIT/ML suspension Take 5 mLs (500,000 Units total) by mouth 2 (two) times daily.  240 mL  3  . prochlorperazine (COMPAZINE) 10 MG tablet Take 1 tablet (10 mg total) by mouth every 6 (six) hours as needed (Nausea or vomiting).  30 tablet  1   No current facility-administered medications for this visit.    SURGICAL HISTORY:  Past Surgical History  Procedure Laterality Date  . Btl    . Epidural steroid injection  2008  . Colonoscopy    . Upper gi  endoscopy    . Partial mastectomy with needle localization and axillary sentinel lymph node bx Bilateral 11/23/2012    Procedure: PARTIAL MASTECTOMY WITH NEEDLE LOCALIZATION AND AXILLARY SENTINEL LYMPH NODE BX;  Surgeon: Ernestene Mention, MD;  Location: [facility];  Service: General;  Laterality: Bilateral;  . Portacath placement Right 11/23/2012    Procedure: INSERTION PORT-A-CATH;  Surgeon: Ernestene Mention, MD;  Location: [facility];  Service: General;  Laterality: Right;    REVIEW OF SYSTEMS:  Pertinent items are noted in HPI.   PHYSICAL EXAMINATION: Blood pressure 104/70, pulse 109, temperature 98.7 F (37.1 C), temperature source Oral, resp. rate 20, height 5\' 3"  (1.6 m), weight 192 lb 14.4 oz (87.499 kg). Body mass index is 34.18 kg/(m^2). General: Patient is a well appearing female in no acute distress HEENT: PERRLA, sclerae anicteric no conjunctival pallor, MMM Neck: supple, no palpable adenopathy Lungs: clear to auscultation bilaterally, no wheezes, rhonchi, or rales Cardiovascular: regular rate rhythm, S1, S2, no murmurs, rubs or gallops Abdomen: Soft, non-tender, non-distended, normoactive bowel sounds, no HSM Extremities: warm and well perfused, no clubbing, cyanosis, or edema Skin: No rashes or lesions Neuro: Non-focal Breasts:  Bilateral lumpectomy sites without nodularity, no masses or skin changes in either breast. ECOG PERFORMANCE STATUS: 0 - Asymptomatic  LABORATORY DATA: Lab Results  Component Value Date   WBC 9.3 02/10/2013   HGB 9.7* 02/10/2013   HCT 29.1* 02/10/2013   MCV 93.0 02/10/2013   PLT 252 02/10/2013      Chemistry      Component Value Date/Time   NA 137 02/03/2013 1106   NA 135 01/08/2013 0555   K 3.6 02/03/2013 1106   K 4.0 01/08/2013 0555   CL 104 02/03/2013 1106   CL 100 01/08/2013 0555   CO2 24 02/03/2013 1106   CO2 28 01/08/2013 0555   BUN 22.2 02/03/2013 1106   BUN 10 01/08/2013 0555   CREATININE 0.7 02/03/2013 1106   CREATININE 0.62 01/08/2013 0555      Component Value Date/Time   CALCIUM 8.9 02/03/2013 1106   CALCIUM 9.1 01/08/2013 0555   ALKPHOS 79 02/03/2013 1106   ALKPHOS 67 01/05/2013 2245   AST 7 02/03/2013 1106   AST 10 01/05/2013 2245   ALT 13 02/03/2013 1106   ALT 15 01/05/2013 2245   BILITOT 0.58 02/03/2013 1106   BILITOT 0.5 01/05/2013 2245    ADDITIONAL INFORMATION: 1. CHROMOGENIC IN-SITU HYBRIDIZATION Results: HER-2/NEU BY CISH - NO AMPLIFICATION OF HER-2 DETECTED. RESULT RATIO OF HER2: CEP 17 SIGNALS 1.00 AVERAGE HER2 COPY NUMBER PER CELL 1.55 REFERENCE RANGE NEGATIVE HER2/Chr17 Ratio <2.0 and Average HER2 copy number <4.0 EQUIVOCAL HER2/Chr17 Ratio <2.0 and Average HER2 copy number 4.0 and <6.0 POSITIVE HER2/Chr17 Ratio >=2.0 and/or Average HER2 copy number >=6.0 Pecola Leisure MD Pathologist, Electronic Signature ( Signed 12/01/2012) 3. CHROMOGENIC IN-SITU HYBRIDIZATION Results: HER-2/NEU BY CISH - NO AMPLIFICATION OF HER-2 DETECTED. RESULT RATIO OF HER2: CEP 17 SIGNALS 1.22 AVERAGE HER2 COPY NUMBER PER CELL 1.40 REFERENCE RANGE NEGATIVE HER2/Chr17 Ratio <2.0 and Average HER2 copy number <4.0 EQUIVOCAL HER2/Chr17 Ratio <2.0 and Average HER2 copy number 4.0 and <6.0 POSITIVE HER2/Chr17 Ratio >=2.0 and/or Average HER2 copy number >=6.0 1 of 5 FINAL for VALERY, AMEDEE ([id number]) ADDITIONAL INFORMATION:(continued) Pecola Leisure MD Pathologist, Electronic Signature ( Signed 12/01/2012) FINAL DIAGNOSIS Diagnosis 1. Breast, lumpectomy, Right - INVASIVE DUCTAL CARCINOMA, GRADE III, SEE COMMENT. - INVASIVE CARCINOMA IS 0.9 CM FROM NEAREST MARGIN (INFERIOR). - NO  LYMPHOVASCULAR INVASION IDENTIFIED. - HIGH GRADE DUCTAL CARCINOMA IN SITU - IN SITU CARCINOMA IS 1 MM FROM THE NEAREST LATERAL MARGIN - SEE TUMOR SYNOPTIC TEMPLATE BELOW. 2. Lymph nodes, regional resection, Right axilla - ELEVEN LYMPH NODES, NEGATIVE FOR TUMOR (0/11). 3. Breast, lumpectomy, Left - INVASIVE DUCTAL CARCINOMA, GRADE I, SEE COMMENT. - LYMPHOVASCULAR INVASION IDENTIFIED - INVASIVE CARCINOMA IS BROADLY PRESENT AT POSTERIOR MARGIN - DUCTAL CARCINOMA IN SITU, GRADE I. - IN SITU CARCINOMA IS FOCALLY PRESENT AT POSTERIOR MARGIN - SEE TUMOR SYNOPTIC TEMPLATE BELOW. 4. Breast, excision, Left - BENIGN BREAST TISSUE, SEE COMMENT. - NEGATIVE FOR ATYPIA OR MALIGNANCY. - SURGICAL MARGIN, NEGATIVE FOR ATYPIA OR MALIGNANCY. 5. Lymph node, sentinel, biopsy, Left axillary - ONE LYMPH NODE, POSITIVE FOR METASTATIC MAMMARY CARCINOMA (1/1). Microscopic Comment 1. BREAST, INVASIVE TUMOR, WITH LYMPH NODE SAMPLING Specimen, including laterality: Right breast Procedure: Lumpectomy Grade: III of III Tubule formation: 3 Nuclear pleomorphism: 3 Mitotic: 3 Tumor size (gross measurement: 1.5 cm Margins: Invasive, distance to closest margin: 0.9 cm In-situ, distance to closest margin: 0.1 cm If margin positive, focally or broadly: N/A Lymphovascular invasion: Absent Ductal carcinoma in situ: Present Grade: III of III Extensive intraductal component: Absent Lobular neoplasia: Absent Tumor focality: Multifocal Treatment effect: None 2 of 5 FINAL for PICCOLA, ARICO ([id number]) Microscopic Comment(continued) If present, treatment effect in breast tissue, lymph nodes or both:  N/A Extent of tumor: Skin: N/A Nipple: N/A Skeletal muscle: N/A Lymph nodes: # examined: 11 (Part 2) Lymph nodes with metastasis: 0 Breast prognostic profile: Estrogen receptor: Repeated, previous study demonstrated 0% positivity ([id number]) Progesterone receptor: Repeated, previous study demonstrated 0% positivity ([id number]) Her 2 neu: Repeated, previous study demonstrated no amplification (1.38) ([id number]) Ki-67: Not repeated, previous study demonstrated 92% proliferation rate ([id number]) Non-neoplastic breast: Previous biopsy site, fibrocystic change, sclerosing adenosis and benign calcifications in benign ducts and lobules. TNM: pT1c, pN0, pMX Comments: There are a total of four distinct areas of abnormality identified. The first 1.5 cm corresponds to the invasive ductal carcinoma. The second 0.9 cm area consists of high grade ductal carcinoma in situ with associated fibrocystic change and fibroadenomatoid nodules. In this focus, the in situ carcinoma is 1 mm from the nearest lateral margin. The third 0.4 cm area consists of fat necrosis and fibrosis. There are no atypical or malignant findings in this area. The fourth and final 0.3 cm area demonstrate non-neoplastic findings to include fibrocystic change. There are no atypical or malignant epithelial findings in this area either. 3. BREAST, INVASIVE TUMOR, WITH LYMPH NODE SAMPLING Specimen, including laterality: Left breast Procedure: Lumpectomy Grade: I of III Tubule formation: 3 Nuclear pleomorphism: 3 Mitotic: 1 Tumor size (gross measurement): 1.0 cm Margins: Invasive, distance to closest margin: Present at posterior In-situ, distance to closest margin: Present at posterior If margin positive, focally or broadly: Broadly (invasive) and focally (in situ) Lymphovascular invasion: Present Ductal carcinoma in situ: Present Grade: I of III Extensive intraductal component: Absent Lobular neoplasia: Absent Tumor  focality: Unifocal Treatment effect: None If present, treatment effect in breast tissue, lymph nodes or both: N/A Extent of tumor: Skin: N/A Nipple: N/A Skeletal muscle: N/A Lymph nodes: # examined: 1 (Part 5) Lymph nodes with metastasis: 1 Isolated tumor cells (< 0.2 mm): 0 Micrometastasis: (> 0.2 mm and < 2.0 mm): 1 Macrometastasis: (> 2.0 mm): 0 3 of 5 FINAL for KAMALA, KOLTON ([id number]) Microscopic Comment(continued) Extracapsular extension: Present Breast prognostic profile: Estrogen receptor: Not repeated, previous study demonstrated 100% positivity ([id number]). Progesterone receptor: Not  repeated, previous study demonstrated 25% positivity ([id number]) Her 2 neu: Repeated, previous study demonstrated no amplification (1.42) ([id number]) Ki-67: Not repeated, previous study demonstrated 9% proliferation rate ([id number]) Non-neoplastic breast: Previous biopsy site, fibrocystic change and microcalcifications in benign ducts and lobules. TNM: pT1b, pN19mi, PMX Comments: The additional 1.0 cm and 1.1 cm hemorrhagic areas grossly identified consist of benign breast tissue with microcalcifications and parenchymal hemorrhage. There are no atypical or malignant findings in these areas. 4. The surgical resection margin(s) of the specimen were inked and microscopically evaluated. There is no mass grossly identified. Representative sections demonstrate predominantly benign fibrovascular and adipose tissue with focal areas of fibrocytic change and sclerosing adenosis. There are no atypical or malignant epithelial or stromal findings identified. 5. There are microscopic foci of intranodal metastatic mammary tumor deposits spanning up to 1 mm. There is focal extracapsular extension identified. (CR:kh 11-25-12) [place] RUND DO Pathologist, Electronic Signature (Case signed 11/25/2012) Specimen Gross and Clinical Information   RADIOGRAPHIC STUDIES:  Chest 2 View  11/19/2012   *RADIOLOGY REPORT*  Clinical Data: Preop for breast cancer  CHEST - 2 VIEW  Comparison: None.  Findings: Cardiomediastinal silhouette is unremarkable.  No acute infiltrate or pleural effusion.  No pulmonary edema.  Bony thorax is unremarkable.  IMPRESSION: No active disease.   Original Report Authenticated By: Natasha Mead, M.D.    Nm Sentinel Node Inj-no Rpt (breast)  11/23/2012  CLINICAL DATA: Bilateral breast cancer   Sulfur colloid was injected intradermally by the nuclear medicine  technologist for breast cancer sentinel node localization.     Dg Chest Portable 1 View  11/23/2012  *RADIOLOGY REPORT*  Clinical Data: 52 year old female status post Port-A-Cath placement.  Breast cancer.  PORTABLE CHEST - 1 VIEW  Comparison: 11/19/2012.  Findings: Portable semi upright AP view 1441 hours.  Right chest subclavian approach Port-A-Cath placed.  Acute angulation of the catheter tubing at the level of the right axilla.  Tip at the level of the cavoatrial junction.  Interval postoperative changes to the right chest wall with subcutaneous appearing drain in place.  No pneumothorax.  No pleural effusion or pulmonary edema.  Mildly increased bibasilar opacity compatible with atelectasis.  Cardiac size and mediastinal contours are within normal limits.  Visualized tracheal air column is within normal limits.  IMPRESSION: 1.  Right subclavian approach chest Port-A-Cath in place.  Tip at the level of the cavoatrial junction.  Mildly angulated catheter tubing at the level of the axilla. 2.  Interval postoperative changes to the right chest wall. No pneumothorax. 3.  Mild dependent atelectasis.   Original Report Authenticated By: Erskine Speed, M.D.    Dg Fluoro Guide Cv Line-no Report  11/23/2012  CLINICAL DATA: bil breast cancer   FLOURO GUIDE CV LINE  Fluoroscopy was utilized by the requesting physician.  No radiographic  interpretation.     Mm Lt Plc Breast Loc Dev   1st Lesion  Inc Mammo Guide  11/23/2012  *RADIOLOGY  REPORT*  Clinical Data:  Left breast cancer  NEEDLE LOCALIZATION WITH MAMMOGRAPHIC GUIDANCE AND SPECIMEN RADIOGRAPH  Comparison:  Previous exams.  Patient presents for needle localization prior to surgery.  I met with the patient and we discussed the procedure of needle localization including benefits and alternatives. We discussed the high likelihood of a successful procedure. We discussed the risks of the procedure, including infection, bleeding, tissue injury, and further surgery. Informed, written consent was given.  Using mammographic guidance, sterile technique, 2% lidocaine and a #9 modified Kopans needle,  the mass and clip in the central aspect of the left breast was localized using a superior approach.  The films are marked for Dr. Derrell Lolling.  Specimen radiograph was performed at Day [facility], and confirms the mass and clip were present in the tissue sample. The clip was located at the margin of the surgical specimen.  Dr.  Derrell Lolling states she had taken more tissue at the margin to clear the border.  The specimen is marked for pathology.  IMPRESSION: Needle localization of the left breast.  No apparent complications.   Original Report Authenticated By: Baird Lyons, M.D.    Mm Rt Plc Breast Loc Dev   1st Lesion  Inc Mammo Guide  11/23/2012  *RADIOLOGY REPORT*  Clinical Data:  Known right breast cancer  NEEDLE LOCALIZATION WITH MAMMOGRAPHIC GUIDANCE AND SPECIMEN RADIOGRAPH  Comparison:  Previous exams.  Patient presents for needle localization prior to surgery.  I met with the patient and we discussed the procedure of needle localization including benefits and alternatives. We discussed the high likelihood of a successful procedure. We discussed the risks of the procedure, including infection, bleeding, tissue injury, and further surgery. Informed, written consent was given.  Using mammographic guidance, sterile technique, 2% lidocaine and a #7 and #9 modified Kopans needles, the mass and nodularity in the upper  outer quadrant of the right breast was bracketedusing a lateral approach.  The films are marked for Dr. Derrell Lolling.  Specimen radiograph was performed at Day Surgery, and confirms the mass and clip are present in the tissue sample.  The specimen is marked for pathology.  IMPRESSION: Needle localization of the right breast.  No apparent complications.   Original Report Authenticated By: Baird Lyons, M.D.     ASSESSMENT: 52 year old female with  #1 bilateral breast cancers triple negative on the right side ER positive on the left side. She is status post bilateral lumpectomies. Clinically she's doing well. She will receive Adriamycin Cytoxan given dose dense x4 cycles followed by Taxol carbo x12 cycles.  Rationale risks benefits and side effects were discussed.  Patient did develop febrile neutropenia after first cycle, should it recur, a dose reduction will be considered.   #2 patient has had multiple hospitalizations to 2 neutropenia. We did dose reduce her.   PLAN:  #1 patient is doing well post cycle 2 of her chemotherapy today. Hopefully she will not be hospitalized with this cycle since we did dose reduce her. Patient is neutropenic and we will go ahead and get her started on prophylactic antibiotics with Cipro 500 mg twice a day. She is encouraged to continue hydrating herself aggressively rest and practice neutropenic precautions.  #2. She will see [place] back in 1 week for interim labs and office visit.   All questions were answered. The patient knows to call the clinic with any problems, questions or concerns. We can certainly see the patient much sooner if necessary.  I spent 25 minutes counseling the patient face to face. The total time spent in the appointment was 30 minutes.  Drue Second, MD Medical/Oncology [facility] [phone] (beeper) [phone] (Office)

## 2013-02-15 NOTE — Progress Notes (Signed)
ATTENDING'S ATTESTATION:  I personally reviewed patient's chart, examined patient myself, formulated the treatment plan as followed.    Patient is doing well but she does have what looks like hand foot from the chemotherapy. We will hold treatment today and see her back in 1 week for the next treatment.  Drue Second, MD Medical/Oncology [facility] [phone] (beeper) [phone] (Office)

## 2013-02-17 ENCOUNTER — Encounter: Payer: Self-pay | Admitting: Adult Health

## 2013-02-17 ENCOUNTER — Ambulatory Visit (HOSPITAL_BASED_OUTPATIENT_CLINIC_OR_DEPARTMENT_OTHER): Payer: BC Managed Care – PPO | Admitting: Adult Health

## 2013-02-17 ENCOUNTER — Other Ambulatory Visit (HOSPITAL_BASED_OUTPATIENT_CLINIC_OR_DEPARTMENT_OTHER): Payer: BC Managed Care – PPO | Admitting: Lab

## 2013-02-17 ENCOUNTER — Ambulatory Visit (HOSPITAL_BASED_OUTPATIENT_CLINIC_OR_DEPARTMENT_OTHER): Payer: BC Managed Care – PPO

## 2013-02-17 VITALS — BP 130/83 | HR 102 | Temp 98.9°F | Resp 20 | Ht 63.0 in | Wt 193.5 lb

## 2013-02-17 DIAGNOSIS — C50919 Malignant neoplasm of unspecified site of unspecified female breast: Secondary | ICD-10-CM

## 2013-02-17 DIAGNOSIS — C50419 Malignant neoplasm of upper-outer quadrant of unspecified female breast: Secondary | ICD-10-CM

## 2013-02-17 DIAGNOSIS — H01009 Unspecified blepharitis unspecified eye, unspecified eyelid: Secondary | ICD-10-CM

## 2013-02-17 DIAGNOSIS — Z5111 Encounter for antineoplastic chemotherapy: Secondary | ICD-10-CM

## 2013-02-17 DIAGNOSIS — C50411 Malignant neoplasm of upper-outer quadrant of right female breast: Secondary | ICD-10-CM

## 2013-02-17 DIAGNOSIS — Z170 Estrogen receptor positive status [ER+]: Secondary | ICD-10-CM

## 2013-02-17 LAB — CBC WITH DIFFERENTIAL/PLATELET
Eosinophils Absolute: 0 10*3/uL (ref 0.0–0.5)
HCT: 30.9 % — ABNORMAL LOW (ref 34.8–46.6)
LYMPH%: 22.3 % (ref 14.0–49.7)
MONO#: 1.4 10*3/uL — ABNORMAL HIGH (ref 0.1–0.9)
NEUT#: 3.1 10*3/uL (ref 1.5–6.5)
NEUT%: 53.5 % (ref 38.4–76.8)
Platelets: 448 10*3/uL — ABNORMAL HIGH (ref 145–400)
RBC: 3.31 10*6/uL — ABNORMAL LOW (ref 3.70–5.45)
WBC: 5.8 10*3/uL (ref 3.9–10.3)
nRBC: 0 % (ref 0–0)

## 2013-02-17 LAB — COMPREHENSIVE METABOLIC PANEL (CC13)
ALT: 19 U/L (ref 0–55)
Albumin: 3.2 g/dL — ABNORMAL LOW (ref 3.5–5.0)
CO2: 25 mEq/L (ref 22–29)
Calcium: 9 mg/dL (ref 8.4–10.4)
Chloride: 109 mEq/L — ABNORMAL HIGH (ref 98–107)
Creatinine: 0.7 mg/dL (ref 0.6–1.1)
Total Protein: 5.8 g/dL — ABNORMAL LOW (ref 6.4–8.3)

## 2013-02-17 MED ORDER — DOXORUBICIN HCL CHEMO IV INJECTION 2 MG/ML
60.0000 mg/m2 | Freq: Once | INTRAVENOUS | Status: AC
  Administered 2013-02-17: 120 mg via INTRAVENOUS
  Filled 2013-02-17: qty 60

## 2013-02-17 MED ORDER — PALONOSETRON HCL INJECTION 0.25 MG/5ML
0.2500 mg | Freq: Once | INTRAVENOUS | Status: AC
  Administered 2013-02-17: 0.25 mg via INTRAVENOUS

## 2013-02-17 MED ORDER — SODIUM CHLORIDE 0.9 % IV SOLN
600.0000 mg/m2 | Freq: Once | INTRAVENOUS | Status: AC
  Administered 2013-02-17: 1200 mg via INTRAVENOUS
  Filled 2013-02-17: qty 60

## 2013-02-17 MED ORDER — SODIUM CHLORIDE 0.9 % IV SOLN
150.0000 mg | Freq: Once | INTRAVENOUS | Status: AC
  Administered 2013-02-17: 150 mg via INTRAVENOUS
  Filled 2013-02-17: qty 5

## 2013-02-17 MED ORDER — HEPARIN SOD (PORK) LOCK FLUSH 100 UNIT/ML IV SOLN
500.0000 [IU] | Freq: Once | INTRAVENOUS | Status: AC | PRN
  Administered 2013-02-17: 500 [IU]
  Filled 2013-02-17: qty 5

## 2013-02-17 MED ORDER — LORAZEPAM 2 MG/ML IJ SOLN
0.5000 mg | Freq: Once | INTRAMUSCULAR | Status: AC
  Administered 2013-02-17: 0.5 mg via INTRAVENOUS

## 2013-02-17 MED ORDER — DEXAMETHASONE SODIUM PHOSPHATE 20 MG/5ML IJ SOLN
12.0000 mg | Freq: Once | INTRAMUSCULAR | Status: AC
  Administered 2013-02-17: 12 mg via INTRAVENOUS

## 2013-02-17 MED ORDER — SODIUM CHLORIDE 0.9 % IJ SOLN
10.0000 mL | INTRAMUSCULAR | Status: DC | PRN
  Administered 2013-02-17: 10 mL
  Filled 2013-02-17: qty 10

## 2013-02-17 MED ORDER — SODIUM CHLORIDE 0.9 % IV SOLN
Freq: Once | INTRAVENOUS | Status: AC
Start: 2013-02-17 — End: 2013-02-17
  Administered 2013-02-17: 13:00:00 via INTRAVENOUS

## 2013-02-17 NOTE — Progress Notes (Deleted)
ATTENDING'S ATTESTATION:  I personally reviewed patient's chart, examined patient myself, formulated the treatment plan as followed.    Patient is doing well but she does have what looks like hand foot from the chemotherapy. We will hold treatment today and see her back in 1 week for the next treatment.  Drue Second, MD Medical/Oncology [facility] [phone] (beeper) [phone] (Office)

## 2013-02-17 NOTE — Patient Instructions (Addendum)
[facility] Discharge Instructions for Patients Receiving Chemotherapy  Today you received the following chemotherapy agents: adriamycin, cytoxan  To help prevent nausea and vomiting after your treatment, we encourage you to take your nausea medication.  Take it as often as prescribed.     If you develop nausea and vomiting that is not controlled by your nausea medication, call the clinic. If it is after clinic hours your family physician or the after hours number for the clinic or go to the Emergency Department.   BELOW ARE SYMPTOMS THAT SHOULD BE REPORTED IMMEDIATELY:  *FEVER GREATER THAN 100.5 F  *CHILLS WITH OR WITHOUT FEVER  NAUSEA AND VOMITING THAT IS NOT CONTROLLED WITH YOUR NAUSEA MEDICATION  *UNUSUAL SHORTNESS OF BREATH  *UNUSUAL BRUISING OR BLEEDING  TENDERNESS IN MOUTH AND THROAT WITH OR WITHOUT PRESENCE OF ULCERS  *URINARY PROBLEMS  *BOWEL PROBLEMS  UNUSUAL RASH Items with * indicate a potential emergency and should be followed up as soon as possible.  Feel free to call the clinic you have any questions or concerns. The clinic phone number is [phone].   I have been informed and understand all the instructions given to me. I know to contact the clinic, my physician, or go to the Emergency Department if any problems should occur. I do not have any questions at this time, but understand that I may call the clinic during office hours   should I have any questions or need assistance in obtaining follow up care.    __________________________________________  _____________  __________ Signature of Patient or Authorized Representative            Date                   Time    __________________________________________ Nurse's Signature    

## 2013-02-17 NOTE — Progress Notes (Signed)
OFFICE PROGRESS NOTE  CC  Amy Manns, MD [address] [place] [address] [place] [address] Dr. Claud Kelp  Dr. Antony Blackbird  DIAGNOSIS: 52 year old female with new diagnosis of bilateral breast cancers.   STAGE:  Cancer of upper-outer quadrant of female breast  Primary site: Breast (Bilateral)  Staging method: AJCC 7th Edition  Clinical: Stage IA (T1c, N0, cM0)  Summary: Stage IA (T1c, N0, cM0)   PRIOR THERAPY: #1Patient recently underwent screening mammograms and she was found to have bilateral breast abnormalities.diagnostic mammogram showed in the right breast an ill-defined 2 cm mass with a few associated well defined microcalcifications over the upper outer quadrant. Spot compression images of the left breast demonstrated an ill defined spiculated 8 mm mass centrally. Ultrasound showed irregular bordered heterogeneous hypoechoic solid mass at the 10:00 position of the right breast 6 cm from the nipple measuring 1.1 x 1.3 x 1.4 cm. Also in this location was an adjacent smaller irregular hypoechoic mass measuring 3 x 5 x 6 mm. The smaller mass was 1 cm from the large more irregular mass. Ultrasound of the axilla demonstrated single lymph node with thickened cortex. Ultrasound of the left breast demonstrated an ill-defined hypoechoic mass with distal acoustic shadowing 7 to 8:00 position 3 cm from the nipple located deep and measuring 5 x 8 x 8 mm ultrasound of the left axilla showed normal appearing lymph nodes.   #2Patient went on to have needle core biopsies performed of both of the masses. 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 The tumor was ER positive PR positive with a Ki-67 of 9% the second tumor was ER negative PR negative HER-2/neu negative with Ki-6792% and elevated.  #3 Patient had MRIs of the breasts performed. There was  noted to be 1.7 cm irregular enhancing mass located within the upper-outer quadrant of the right breast with 3 adjacent worrisome enhancing satellite nodules in aggregate the nodules and mass measures 3.8 cm. In the left breast 8 mm irregular enhancing mass located within the central left breast was noted. No evidence of axillary or internal mammary adenopathy.  #4patient is status post bilateral lumpectomies. The right breast revealed a 1.5 cm grade 3 invasive ductal carcinoma ER/PR negative HER-2/neu negative Ki-67 92%. Sentinel node was negative. Left breast showed 1.0 cm grade 3 invasive ductal carcinoma ER/PR positive HER-2/neu negative sentinel node was negative.  #5 patient is to begin adjuvant chemotherapy consisting of Adriamycin Cytoxan every 2 weeks for a total of 4 cycles 12/30/12. This would then be followed by Taxol and carboplatinum weekly for 12 weeks. Patient will need adjuvant radiation therapy. Because one of the tumor is ER positive she will also receive antiestrogen therapy.  CURRENT THERAPY:cycle 4 day 1 of adjuvant dose dense AC  INTERVAL HISTORY: Amy Leonard 52 y.o. female returns for followup visit prior to her fourth visit for adriamycin/cytoxan.  She is feeling well today.  The erythema and pain in her feet is much improved from prior.  She does have some crusting on her eyelids in the morning and at night.  She denies itching in the eyes, and has no injection.  She denies fevers, chills, nausea, vomiting, constipation, diarrhea, numbness.    MEDICAL HISTORY: Past Medical History  Diagnosis Date  . Elevated WBC count     nl diff  . Chronic back pain  spinal stenosis  . Tobacco abuse   . Anxiety and depression   . HSV infection     recurrent (side and buttox)  . Migraine   . Glaucoma     ?  . Carpal tunnel syndrome   . HLD (hyperlipidemia)   . Folliculitis 05/05/2011  . Anxiety   . Depression   . 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 GERD  (gastroesophageal reflux disease)   . Hypertension   . Breast cancer   . Wears dentures     top    ALLERGIES:  is allergic to bupropion hcl; chlorhexidine gluconate; hydrocod polst-cpm polst er; lisinopril; and naproxen sodium.  MEDICATIONS:  Current Outpatient Prescriptions  Medication Sig Dispense Refill  . Acetaminophen (TYLENOL ARTHRITIS PAIN PO) Take 2 tablets by mouth as needed (pain).       Marland Kitchen acyclovir (ZOVIRAX) 5 % ointment Apply 1 application topically as needed (flare-up).       Marland Kitchen aspirin 81 MG chewable tablet Chew 81 mg by mouth daily.      . ATIVAN 0.5 MG tablet Take 1 tablet (0.5 mg total) by mouth every 6 (six) hours as needed (Nausea or vomiting).  45 tablet  0  . atorvastatin (LIPITOR) 10 MG tablet Take 1 tablet (10 mg total) by mouth daily.  90 tablet  1  . calcium carbonate (TUMS - DOSED IN MG ELEMENTAL CALCIUM) 500 MG chewable tablet Chew by mouth as directed.        . ciprofloxacin (CIPRO) 500 MG tablet Take 1 tablet (500 mg total) by mouth 2 (two) times daily.  14 tablet  3  . COMPRO 25 MG suppository       . cyclobenzaprine (FLEXERIL) 10 MG tablet Take 10 mg by mouth as needed (muscle spasm).       Marland Kitchen dexamethasone (DECADRON) 4 MG tablet Take 2 tablets by mouth once a day on the day after chemotherapy and then take 2 tablets two times a day for 2 days. Take with food.  30 tablet  1  . fluconazole (DIFLUCAN) 100 MG tablet Take 2 tablets (200 mg total) by mouth daily.  14 tablet  0  . lidocaine-prilocaine (EMLA) cream Apply topically as needed.  30 g  8  . losartan (COZAAR) 50 MG tablet Take 1 tablet (50 mg total) by mouth daily.  90 tablet  2  . nystatin (MYCOSTATIN) 100000 UNIT/ML suspension Take 5 mLs (500,000 Units total) by mouth 2 (two) times daily.  240 mL  3  . omeprazole (PRILOSEC) 40 MG capsule Take 1 capsule (40 mg total) by mouth 2 (two) times daily.  180 capsule  3  . ondansetron (ZOFRAN) 8 MG tablet Take 1 tablet (8 mg total) by mouth 2 (two) times daily as  needed. Take two times a day as needed for nausea or vomiting starting on the third day after chemotherapy.  30 tablet  1  . prochlorperazine (COMPAZINE) 10 MG tablet Take 1 tablet (10 mg total) by mouth every 6 (six) hours as needed (Nausea or vomiting).  30 tablet  1  . simethicone (MYLICON) 80 MG chewable tablet Chew 1 tablet (80 mg total) by mouth every 8 (eight) hours as needed for flatulence.  60 tablet  3   No current facility-administered medications for this visit.    SURGICAL HISTORY:  Past Surgical History  Procedure Laterality Date  . Btl    . Epidural steroid injection  2008  . Colonoscopy    . Upper gi  endoscopy    . Partial mastectomy with needle localization and axillary sentinel lymph node bx Bilateral 11/23/2012    Procedure: PARTIAL MASTECTOMY WITH NEEDLE LOCALIZATION AND AXILLARY SENTINEL LYMPH NODE BX;  Surgeon: Ernestene Mention, MD;  Location: [facility];  Service: General;  Laterality: Bilateral;  . Portacath placement Right 11/23/2012    Procedure: INSERTION PORT-A-CATH;  Surgeon: Ernestene Mention, MD;  Location: [facility];  Service: General;  Laterality: Right;    REVIEW OF SYSTEMS:  Pertinent items are noted in HPI.   PHYSICAL EXAMINATION: Blood pressure 130/83, pulse 102, temperature 98.9 F (37.2 C), temperature source Oral, resp. rate 20, height 5\' 3"  (1.6 m), weight 193 lb 8 oz (87.771 kg). Body mass index is 34.29 kg/(m^2). General: Patient is a well appearing female in no acute distress HEENT: PERRLA, no scleral injection, crusting, or puffiness of eyes noted.  sclerae anicteric no conjunctival pallor, MMM Neck: supple, no palpable adenopathy Lungs: clear to auscultation bilaterally, no wheezes, rhonchi, or rales Cardiovascular: regular rhythm, S1, S2, no murmurs, rubs or gallops, slightly tachycardic at 110 Abdomen: Soft, non-tender, non-distended, normoactive bowel sounds, no HSM Extremities: warm and well perfused, no  clubbing, cyanosis, or edema, entire soles of feet are erythematous-improved, no skin breakdown noted.  Her fingertips have no noted abnormality Skin: No rashes or lesions Neuro: Non-focal Breasts: Bilateral lumpectomy sites without nodularity, no masses or skin changes in either breast. ECOG PERFORMANCE STATUS: 0 - Asymptomatic  LABORATORY DATA: Lab Results  Component Value Date   WBC 5.8 02/17/2013   HGB 10.3* 02/17/2013   HCT 30.9* 02/17/2013   MCV 93.4 02/17/2013   PLT 448* 02/17/2013      Chemistry      Component Value Date/Time   NA 137 02/03/2013 1106   NA 135 01/08/2013 0555   K 3.6 02/03/2013 1106   K 4.0 01/08/2013 0555   CL 104 02/03/2013 1106   CL 100 01/08/2013 0555   CO2 24 02/03/2013 1106   CO2 28 01/08/2013 0555   BUN 22.2 02/03/2013 1106   BUN 10 01/08/2013 0555   CREATININE 0.7 02/03/2013 1106   CREATININE 0.62 01/08/2013 0555      Component Value Date/Time   CALCIUM 8.9 02/03/2013 1106   CALCIUM 9.1 01/08/2013 0555   ALKPHOS 79 02/03/2013 1106   ALKPHOS 67 01/05/2013 2245   AST 7 02/03/2013 1106   AST 10 01/05/2013 2245   ALT 13 02/03/2013 1106   ALT 15 01/05/2013 2245   BILITOT 0.58 02/03/2013 1106   BILITOT 0.5 01/05/2013 2245    ADDITIONAL INFORMATION: 1. CHROMOGENIC IN-SITU HYBRIDIZATION Results: HER-2/NEU BY CISH - NO AMPLIFICATION OF HER-2 DETECTED. RESULT RATIO OF HER2: CEP 17 SIGNALS 1.00 AVERAGE HER2 COPY NUMBER PER CELL 1.55 REFERENCE RANGE NEGATIVE HER2/Chr17 Ratio <2.0 and Average HER2 copy number <4.0 EQUIVOCAL HER2/Chr17 Ratio <2.0 and Average HER2 copy number 4.0 and <6.0 POSITIVE HER2/Chr17 Ratio >=2.0 and/or Average HER2 copy number >=6.0 Pecola Leisure MD Pathologist, Electronic Signature ( Signed 12/01/2012) 3. CHROMOGENIC IN-SITU HYBRIDIZATION Results: HER-2/NEU BY CISH - NO AMPLIFICATION OF HER-2 DETECTED. RESULT RATIO OF HER2: CEP 17 SIGNALS 1.22 AVERAGE HER2 COPY NUMBER PER CELL 1.40 REFERENCE RANGE NEGATIVE HER2/Chr17 Ratio <2.0 and  Average HER2 copy number <4.0 EQUIVOCAL HER2/Chr17 Ratio <2.0 and Average HER2 copy number 4.0 and <6.0 POSITIVE HER2/Chr17 Ratio >=2.0 and/or Average HER2 copy number >=6.0 1 of 5 FINAL for MIKENA, MASONER ([id number]) ADDITIONAL INFORMATION:(continued) Pecola Leisure MD Pathologist, Electronic Signature (  Signed 12/01/2012) FINAL DIAGNOSIS Diagnosis 1. Breast, lumpectomy, Right - INVASIVE DUCTAL CARCINOMA, GRADE III, SEE COMMENT. - INVASIVE CARCINOMA IS 0.9 CM FROM NEAREST MARGIN (INFERIOR). - NO LYMPHOVASCULAR INVASION IDENTIFIED. - HIGH GRADE DUCTAL CARCINOMA IN SITU - IN SITU CARCINOMA IS 1 MM FROM THE NEAREST LATERAL MARGIN - SEE TUMOR SYNOPTIC TEMPLATE BELOW. 2. Lymph nodes, regional resection, Right axilla - ELEVEN LYMPH NODES, NEGATIVE FOR TUMOR (0/11). 3. Breast, lumpectomy, Left - INVASIVE DUCTAL CARCINOMA, GRADE I, SEE COMMENT. - LYMPHOVASCULAR INVASION IDENTIFIED - INVASIVE CARCINOMA IS BROADLY PRESENT AT POSTERIOR MARGIN - DUCTAL CARCINOMA IN SITU, GRADE I. - IN SITU CARCINOMA IS FOCALLY PRESENT AT POSTERIOR MARGIN - SEE TUMOR SYNOPTIC TEMPLATE BELOW. 4. Breast, excision, Left - BENIGN BREAST TISSUE, SEE COMMENT. - NEGATIVE FOR ATYPIA OR MALIGNANCY. - SURGICAL MARGIN, NEGATIVE FOR ATYPIA OR MALIGNANCY. 5. Lymph node, sentinel, biopsy, Left axillary - ONE LYMPH NODE, POSITIVE FOR METASTATIC MAMMARY CARCINOMA (1/1). Microscopic Comment 1. BREAST, INVASIVE TUMOR, WITH LYMPH NODE SAMPLING Specimen, including laterality: Right breast Procedure: Lumpectomy Grade: III of III Tubule formation: 3 Nuclear pleomorphism: 3 Mitotic: 3 Tumor size (gross measurement: 1.5 cm Margins: Invasive, distance to closest margin: 0.9 cm In-situ, distance to closest margin: 0.1 cm If margin positive, focally or broadly: N/A Lymphovascular invasion: Absent Ductal carcinoma in situ: Present Grade: III of III Extensive intraductal component: Absent Lobular neoplasia: Absent Tumor  focality: Multifocal Treatment effect: None 2 of 5 FINAL for DYNVER, CLEMSON ([id number]) Microscopic Comment(continued) If present, treatment effect in breast tissue, lymph nodes or both: N/A Extent of tumor: Skin: N/A Nipple: N/A Skeletal muscle: N/A Lymph nodes: # examined: 11 (Part 2) Lymph nodes with metastasis: 0 Breast prognostic profile: Estrogen receptor: Repeated, previous study demonstrated 0% positivity ([id number]) Progesterone receptor: Repeated, previous study demonstrated 0% positivity ([id number]) Her 2 neu: Repeated, previous study demonstrated no amplification (1.38) ([id number]) Ki-67: Not repeated, previous study demonstrated 92% proliferation rate ([id number]) Non-neoplastic breast: Previous biopsy site, fibrocystic change, sclerosing adenosis and benign calcifications in benign ducts and lobules. TNM: pT1c, pN0, pMX Comments: There are a total of four distinct areas of abnormality identified. The first 1.5 cm corresponds to the invasive ductal carcinoma. The second 0.9 cm area consists of high grade ductal carcinoma in situ with associated fibrocystic change and fibroadenomatoid nodules. In this focus, the in situ carcinoma is 1 mm from the nearest lateral margin. The third 0.4 cm area consists of fat necrosis and fibrosis. There are no atypical or malignant findings in this area. The fourth and final 0.3 cm area demonstrate non-neoplastic findings to include fibrocystic change. There are no atypical or malignant epithelial findings in this area either. 3. BREAST, INVASIVE TUMOR, WITH LYMPH NODE SAMPLING Specimen, including laterality: Left breast Procedure: Lumpectomy Grade: I of III Tubule formation: 3 Nuclear pleomorphism: 3 Mitotic: 1 Tumor size (gross measurement): 1.0 cm Margins: Invasive, distance to closest margin: Present at posterior In-situ, distance to closest margin: Present at posterior If margin positive, focally or broadly: Broadly  (invasive) and focally (in situ) Lymphovascular invasion: Present Ductal carcinoma in situ: Present Grade: I of III Extensive intraductal component: Absent Lobular neoplasia: Absent Tumor focality: Unifocal Treatment effect: None If present, treatment effect in breast tissue, lymph nodes or both: N/A Extent of tumor: Skin: N/A Nipple: N/A Skeletal muscle: N/A Lymph nodes: # examined: 1 (Part 5) Lymph nodes with metastasis: 1 Isolated tumor cells (< 0.2 mm): 0 Micrometastasis: (> 0.2 mm and < 2.0 mm): 1 Macrometastasis: (> 2.0 mm): 0 3  of 5 FINAL for BAMBI, FEHNEL ([id number]) Microscopic Comment(continued) Extracapsular extension: Present Breast prognostic profile: Estrogen receptor: Not repeated, previous study demonstrated 100% positivity ([id number]). Progesterone receptor: Not repeated, previous study demonstrated 25% positivity ([id number]) Her 2 neu: Repeated, previous study demonstrated no amplification (1.42) ([id number]) Ki-67: Not repeated, previous study demonstrated 9% proliferation rate ([id number]) Non-neoplastic breast: Previous biopsy site, fibrocystic change and microcalcifications in benign ducts and lobules. TNM: pT1b, pN47mi, PMX Comments: The additional 1.0 cm and 1.1 cm hemorrhagic areas grossly identified consist of benign breast tissue with microcalcifications and parenchymal hemorrhage. There are no atypical or malignant findings in these areas. 4. The surgical resection margin(s) of the specimen were inked and microscopically evaluated. There is no mass grossly identified. Representative sections demonstrate predominantly benign fibrovascular and adipose tissue with focal areas of fibrocytic change and sclerosing adenosis. There are no atypical or malignant epithelial or stromal findings identified. 5. There are microscopic foci of intranodal metastatic mammary tumor deposits spanning up to 1 mm. There is focal extracapsular extension identified.  (CR:kh 11-25-12) [place] RUND DO Pathologist, Electronic Signature (Case signed 11/25/2012) Specimen Gross and Clinical Information   RADIOGRAPHIC STUDIES:  Chest 2 View  11/19/2012  *RADIOLOGY REPORT*  Clinical Data: Preop for breast cancer  CHEST - 2 VIEW  Comparison: None.  Findings: Cardiomediastinal silhouette is unremarkable.  No acute infiltrate or pleural effusion.  No pulmonary edema.  Bony thorax is unremarkable.  IMPRESSION: No active disease.   Original Report Authenticated By: Natasha Mead, M.D.    Nm Sentinel Node Inj-no Rpt (breast)  11/23/2012  CLINICAL DATA: Bilateral breast cancer   Sulfur colloid was injected intradermally by the nuclear medicine  technologist for breast cancer sentinel node localization.     Dg Chest Portable 1 View  11/23/2012  *RADIOLOGY REPORT*  Clinical Data: 52 year old female status post Port-A-Cath placement.  Breast cancer.  PORTABLE CHEST - 1 VIEW  Comparison: 11/19/2012.  Findings: Portable semi upright AP view 1441 hours.  Right chest subclavian approach Port-A-Cath placed.  Acute angulation of the catheter tubing at the level of the right axilla.  Tip at the level of the cavoatrial junction.  Interval postoperative changes to the right chest wall with subcutaneous appearing drain in place.  No pneumothorax.  No pleural effusion or pulmonary edema.  Mildly increased bibasilar opacity compatible with atelectasis.  Cardiac size and mediastinal contours are within normal limits.  Visualized tracheal air column is within normal limits.  IMPRESSION: 1.  Right subclavian approach chest Port-A-Cath in place.  Tip at the level of the cavoatrial junction.  Mildly angulated catheter tubing at the level of the axilla. 2.  Interval postoperative changes to the right chest wall. No pneumothorax. 3.  Mild dependent atelectasis.   Original Report Authenticated By: Erskine Speed, M.D.    Dg Fluoro Guide Cv Line-no Report  11/23/2012  CLINICAL DATA: bil breast cancer   FLOURO  GUIDE CV LINE  Fluoroscopy was utilized by the requesting physician.  No radiographic  interpretation.     Mm Lt Plc Breast Loc Dev   1st Lesion  Inc Mammo Guide  11/23/2012  *RADIOLOGY REPORT*  Clinical Data:  Left breast cancer  NEEDLE LOCALIZATION WITH MAMMOGRAPHIC GUIDANCE AND SPECIMEN RADIOGRAPH  Comparison:  Previous exams.  Patient presents for needle localization prior to surgery.  I met with the patient and we discussed the procedure of needle localization including benefits and alternatives. We discussed the high likelihood of a successful procedure. We discussed the risks of  the procedure, including infection, bleeding, tissue injury, and further surgery. Informed, written consent was given.  Using mammographic guidance, sterile technique, 2% lidocaine and a #9 modified Kopans needle, the mass and clip in the central aspect of the left breast was localized using a superior approach.  The films are marked for Dr. Derrell Lolling.  Specimen radiograph was performed at [facility], and confirms the mass and clip were present in the tissue sample. The clip was located at the margin of the surgical specimen.  Dr.  Derrell Lolling states she had taken more tissue at the margin to clear the border.  The specimen is marked for pathology.  IMPRESSION: Needle localization of the left breast.  No apparent complications.   Original Report Authenticated By: Baird Lyons, M.D.    Mm Rt Plc Breast Loc Dev   1st Lesion  Inc Mammo Guide  11/23/2012  *RADIOLOGY REPORT*  Clinical Data:  Known right breast cancer  NEEDLE LOCALIZATION WITH MAMMOGRAPHIC GUIDANCE AND SPECIMEN RADIOGRAPH  Comparison:  Previous exams.  Patient presents for needle localization prior to surgery.  I met with the patient and we discussed the procedure of needle localization including benefits and alternatives. We discussed the high likelihood of a successful procedure. We discussed the risks of the procedure, including infection, bleeding, tissue injury, and  further surgery. Informed, written consent was given.  Using mammographic guidance, sterile technique, 2% lidocaine and a #7 and #9 modified Kopans needles, the mass and nodularity in the upper outer quadrant of the right breast was bracketedusing a lateral approach.  The films are marked for Dr. Derrell Lolling.  Specimen radiograph was performed at Day Surgery, and confirms the mass and clip are present in the tissue sample.  The specimen is marked for pathology.  IMPRESSION: Needle localization of the right breast.  No apparent complications.   Original Report Authenticated By: Baird Lyons, M.D.     ASSESSMENT: 52 year old female with  #1 bilateral breast cancers triple negative on the right side ER positive on the left side. She is status post bilateral lumpectomies. Clinically she's doing well. She will receive Adriamycin Cytoxan given dose dense x4 cycles followed by Taxol carbo x12 cycles.  Rationale risks benefits and side effects were discussed.  Patient did develop febrile neutropenia after first cycle, should it recur, a dose reduction will be considered.   #2 patient has had an echocardiogram Port-A-Cath placed and chemotherapy teaching class.  #3 Blepharitis   PLAN:   #1 Doing well.  She will proceed with chemotherapy today.  Her feet are improved and she will continue with moisturizing them.     2. She will see [place] back in 1 week for interim labs and office visit, and evaluation of chemotoxicities.  3. I recommended warm compresses with baby shampoo bid and visine eye drops.  Should the problem persist she knows to call and I will prescribe eye gtts.    All questions were answered. The patient knows to call the clinic with any problems, questions or concerns. We can certainly see the patient much sooner if necessary.  I spent 25 minutes counseling the patient face to face. The total time spent in the appointment was 30 minutes.  Cherie Ouch Lyn Hollingshead, NP Medical Oncology [facility] Phone: [phone] 02/17/2013, 12:08 PM

## 2013-02-17 NOTE — Progress Notes (Signed)
Quick Note:  Call patient: take potassium 20 meq po daily x 7 days ______

## 2013-02-17 NOTE — Patient Instructions (Addendum)
Doing well.  Proceed with chemotherapy.  We will see you back in one week.  Use warm compresses with baby shampoo twice per day.  Please call us if you have any questions or concerns.

## 2013-02-18 ENCOUNTER — Telehealth: Payer: Self-pay | Admitting: Medical Oncology

## 2013-02-18 ENCOUNTER — Ambulatory Visit (HOSPITAL_BASED_OUTPATIENT_CLINIC_OR_DEPARTMENT_OTHER): Payer: BC Managed Care – PPO

## 2013-02-18 VITALS — BP 119/84 | HR 109 | Temp 97.8°F

## 2013-02-18 DIAGNOSIS — C50411 Malignant neoplasm of upper-outer quadrant of right female breast: Secondary | ICD-10-CM

## 2013-02-18 DIAGNOSIS — C50919 Malignant neoplasm of unspecified site of unspecified female breast: Secondary | ICD-10-CM

## 2013-02-18 DIAGNOSIS — Z5189 Encounter for other specified aftercare: Secondary | ICD-10-CM

## 2013-02-18 DIAGNOSIS — C50419 Malignant neoplasm of upper-outer quadrant of unspecified female breast: Secondary | ICD-10-CM

## 2013-02-18 MED ORDER — POTASSIUM CHLORIDE CRYS ER 20 MEQ PO TBCR: 20.0000 meq | EXTENDED_RELEASE_TABLET | Freq: Every day | ORAL | Status: DC

## 2013-02-18 MED ORDER — PEGFILGRASTIM INJECTION 6 MG/0.6ML
6.0000 mg | Freq: Once | SUBCUTANEOUS | Status: AC
  Administered 2013-02-18: 6 mg via SUBCUTANEOUS
  Filled 2013-02-18: qty 0.6

## 2013-02-18 NOTE — Telephone Encounter (Signed)
LVMOM. Per MD, called patient, potassium low and MD would like for her to take potassium 20 meq by mouth daily for 7 days, prescription sent to pt's listed pharmacy. Patient to return call to verify receipt of message and instructions.

## 2013-02-18 NOTE — Telephone Encounter (Signed)
Message copied by Rexene Edison on Thu Feb 18, 2013  1:13 PM ------      Message from: Victorino December      Created: Wed Feb 17, 2013  5:18 PM       Call patient: take potassium 20 meq po daily x 7 days ------

## 2013-02-18 NOTE — Patient Instructions (Addendum)

## 2013-02-24 ENCOUNTER — Other Ambulatory Visit (HOSPITAL_BASED_OUTPATIENT_CLINIC_OR_DEPARTMENT_OTHER): Payer: BC Managed Care – PPO | Admitting: Lab

## 2013-02-24 ENCOUNTER — Encounter: Payer: Self-pay | Admitting: Oncology

## 2013-02-24 ENCOUNTER — Ambulatory Visit (HOSPITAL_BASED_OUTPATIENT_CLINIC_OR_DEPARTMENT_OTHER): Payer: BC Managed Care – PPO | Admitting: Adult Health

## 2013-02-24 ENCOUNTER — Encounter: Payer: Self-pay | Admitting: Adult Health

## 2013-02-24 VITALS — BP 101/69 | HR 114 | Temp 98.9°F | Resp 20 | Ht 63.0 in | Wt 189.0 lb

## 2013-02-24 DIAGNOSIS — C50411 Malignant neoplasm of upper-outer quadrant of right female breast: Secondary | ICD-10-CM

## 2013-02-24 DIAGNOSIS — C50919 Malignant neoplasm of unspecified site of unspecified female breast: Secondary | ICD-10-CM

## 2013-02-24 DIAGNOSIS — C50419 Malignant neoplasm of upper-outer quadrant of unspecified female breast: Secondary | ICD-10-CM

## 2013-02-24 LAB — COMPREHENSIVE METABOLIC PANEL (CC13)
Albumin: 3.3 g/dL — ABNORMAL LOW (ref 3.5–5.0)
Alkaline Phosphatase: 57 U/L (ref 40–150)
CO2: 25 mEq/L (ref 22–29)
Calcium: 8.9 mg/dL (ref 8.4–10.4)
Chloride: 105 mEq/L (ref 98–109)
Glucose: 150 mg/dl — ABNORMAL HIGH (ref 70–140)
Potassium: 4.2 mEq/L (ref 3.5–5.1)
Sodium: 136 mEq/L (ref 136–145)
Total Protein: 5.9 g/dL — ABNORMAL LOW (ref 6.4–8.3)

## 2013-02-24 LAB — CBC WITH DIFFERENTIAL/PLATELET
BASO%: 0 % (ref 0.0–2.0)
Basophils Absolute: 0 10*3/uL (ref 0.0–0.1)
EOS%: 6.8 % (ref 0.0–7.0)
HCT: 31 % — ABNORMAL LOW (ref 34.8–46.6)
HGB: 10.4 g/dL — ABNORMAL LOW (ref 11.6–15.9)
MCH: 31.4 pg (ref 25.1–34.0)
MCHC: 33.5 g/dL (ref 31.5–36.0)
MCV: 93.7 fL (ref 79.5–101.0)
MONO%: 2.3 % (ref 0.0–14.0)
NEUT%: 6.8 % — ABNORMAL LOW (ref 38.4–76.8)

## 2013-02-24 NOTE — Progress Notes (Signed)
Put fmla form on nurse's desk °

## 2013-02-24 NOTE — Progress Notes (Signed)
Faxed fmla form to the [facility] [phone].

## 2013-02-24 NOTE — Progress Notes (Signed)
OFFICE PROGRESS NOTE  CC  Amy Manns, MD [address] [place] [address], [place] [address] Dr. Claud Kelp  Dr. Antony Blackbird  DIAGNOSIS: 52 year old female with new diagnosis of bilateral breast cancers.   STAGE:  Cancer of upper-outer quadrant of female breast  Primary site: Breast (Bilateral)  Staging method: AJCC 7th Edition  Clinical: Stage IA (T1c, N0, cM0)  Summary: Stage IA (T1c, N0, cM0)   PRIOR THERAPY: #1Patient recently underwent screening mammograms and she was found to have bilateral breast abnormalities.diagnostic mammogram showed in the right breast an ill-defined 2 cm mass with a few associated well defined microcalcifications over the upper outer quadrant. Spot compression images of the left breast demonstrated an ill defined spiculated 8 mm mass centrally. Ultrasound showed irregular bordered heterogeneous hypoechoic solid mass at the 10:00 position of the right breast 6 cm from the nipple measuring 1.1 x 1.3 x 1.4 cm. Also in this location was an adjacent smaller irregular hypoechoic mass measuring 3 x 5 x 6 mm. The smaller mass was 1 cm from the large more irregular mass. Ultrasound of the axilla demonstrated single lymph node with thickened cortex. Ultrasound of the left breast demonstrated an ill-defined hypoechoic mass with distal acoustic shadowing 7 to 8:00 position 3 cm from the nipple located deep and measuring 5 x 8 x 8 mm ultrasound of the left axilla showed normal appearing lymph nodes.   #2Patient went on to have needle core biopsies performed of both of the masses. 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 The tumor was ER positive PR positive with a Ki-67 of 9% the second tumor was ER negative PR negative HER-2/neu negative with Ki-6792% and elevated.  #3 Patient had MRIs of the breasts performed. There was  noted to be 1.7 cm irregular enhancing mass located within the upper-outer quadrant of the right breast with 3 adjacent worrisome enhancing satellite nodules in aggregate the nodules and mass measures 3.8 cm. In the left breast 8 mm irregular enhancing mass located within the central left breast was noted. No evidence of axillary or internal mammary adenopathy.  #4patient is status post bilateral lumpectomies. The right breast revealed a 1.5 cm grade 3 invasive ductal carcinoma ER/PR negative HER-2/neu negative Ki-67 92%. Sentinel node was negative. Left breast showed 1.0 cm grade 3 invasive ductal carcinoma ER/PR positive HER-2/neu negative sentinel node was negative.  #5 patient is to begin adjuvant chemotherapy consisting of Adriamycin Cytoxan every 2 weeks for a total of 4 cycles 12/30/12. This would then be followed by Taxol and carboplatinum weekly for 12 weeks. Patient will need adjuvant radiation therapy. Because one of the tumor is ER positive she will also receive antiestrogen therapy.  CURRENT THERAPY:cycle 4 day 8 of adjuvant dose dense AC  INTERVAL HISTORY: Amy Leonard 52 y.o. female returns for followup visit following her fourth visit for adriamycin/cytoxan.  She is feeling well today.  She is neutropenic and mildly thrombocytopenic.  She has starting having mild mouth and throat pain, and mild pain in her feet.  She has no oral lesions, no skin lesions or redness on her feet or hands.  Otherwise, she denies fevers, chills, nausea, vomiting, constipation, numbness or any further concerns.      MEDICAL HISTORY: Past Medical History  Diagnosis Date  . Elevated WBC count     nl diff  .  Chronic back pain     spinal stenosis  . Tobacco abuse   . Anxiety and depression   . HSV infection     recurrent (side and buttox)  . Migraine   . Glaucoma     ?  . Carpal tunnel syndrome   . HLD (hyperlipidemia)   . Folliculitis 05/05/2011  . Anxiety   . Depression   . 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 GERD (gastroesophageal reflux disease)   . Hypertension   . Breast cancer   . Wears dentures     top    ALLERGIES:  is allergic to bupropion hcl; chlorhexidine gluconate; hydrocod polst-cpm polst er; lisinopril; and naproxen sodium.  MEDICATIONS:  Current Outpatient Prescriptions  Medication Sig Dispense Refill  . Acetaminophen (TYLENOL ARTHRITIS PAIN PO) Take 2 tablets by mouth as needed (pain).       Marland Kitchen acyclovir (ZOVIRAX) 5 % ointment Apply 1 application topically as needed (flare-up).       Marland Kitchen aspirin 81 MG chewable tablet Chew 81 mg by mouth daily.      . ATIVAN 0.5 MG tablet Take 1 tablet (0.5 mg total) by mouth every 6 (six) hours as needed (Nausea or vomiting).  45 tablet  0  . atorvastatin (LIPITOR) 10 MG tablet Take 1 tablet (10 mg total) by mouth daily.  90 tablet  1  . calcium carbonate (TUMS - DOSED IN MG ELEMENTAL CALCIUM) 500 MG chewable tablet Chew by mouth as directed.        . ciprofloxacin (CIPRO) 500 MG tablet Take 1 tablet (500 mg total) by mouth 2 (two) times daily.  14 tablet  3  . COMPRO 25 MG suppository       . cyclobenzaprine (FLEXERIL) 10 MG tablet Take 10 mg by mouth as needed (muscle spasm).       Marland Kitchen dexamethasone (DECADRON) 4 MG tablet Take 2 tablets by mouth once a day on the day after chemotherapy and then take 2 tablets two times a day for 2 days. Take with food.  30 tablet  1  . fluconazole (DIFLUCAN) 100 MG tablet Take 2 tablets (200 mg total) by mouth daily.  14 tablet  0  . lidocaine-prilocaine (EMLA) cream Apply topically as needed.  30 g  8  . losartan (COZAAR) 50 MG tablet Take 1 tablet (50 mg total) by mouth daily.  90 tablet  2  . nystatin (MYCOSTATIN) 100000 UNIT/ML suspension Take 5 mLs (500,000 Units total) by mouth 2 (two) times daily.  240 mL  3  . omeprazole (PRILOSEC) 40 MG capsule Take 1 capsule (40 mg total) by mouth 2 (two) times daily.  180 capsule  3  . ondansetron (ZOFRAN) 8 MG tablet Take 1 tablet (8 mg total)  by mouth 2 (two) times daily as needed. Take two times a day as needed for nausea or vomiting starting on the third day after chemotherapy.  30 tablet  1  . potassium chloride SA (K-DUR,KLOR-CON) 20 MEQ tablet Take 1 tablet (20 mEq total) by mouth daily.  7 tablet  0  . prochlorperazine (COMPAZINE) 10 MG tablet Take 1 tablet (10 mg total) by mouth every 6 (six) hours as needed (Nausea or vomiting).  30 tablet  1  . simethicone (MYLICON) 80 MG chewable tablet Chew 1 tablet (80 mg total) by mouth every 8 (eight) hours as needed for flatulence.  60 tablet  3   No current facility-administered medications for this visit.    SURGICAL  HISTORY:  Past Surgical History  Procedure Laterality Date  . Btl    . Epidural steroid injection  2008  . Colonoscopy    . Upper gi endoscopy    . Partial mastectomy with needle localization and axillary sentinel lymph node bx Bilateral 11/23/2012    Procedure: PARTIAL MASTECTOMY WITH NEEDLE LOCALIZATION AND AXILLARY SENTINEL LYMPH NODE BX;  Surgeon: Ernestene Mention, MD;  Location: [facility];  Service: General;  Laterality: Bilateral;  . Portacath placement Right 11/23/2012    Procedure: INSERTION PORT-A-CATH;  Surgeon: Ernestene Mention, MD;  Location: [facility];  Service: General;  Laterality: Right;    REVIEW OF SYSTEMS:  Pertinent items are noted in HPI.   PHYSICAL EXAMINATION: Blood pressure 101/69, pulse 114, temperature 98.9 F (37.2 C), resp. rate 20, height 5\' 3"  (1.6 m), weight 189 lb (85.73 kg). Body mass index is 33.49 kg/(m^2). General: Patient is a well appearing female in no acute distress HEENT: PERRLA, no scleral injection, crusting, or puffiness of eyes noted.  sclerae anicteric no conjunctival pallor, MMM Neck: supple, no palpable adenopathy Lungs: clear to auscultation bilaterally, no wheezes, rhonchi, or rales Cardiovascular: regular rhythm, S1, S2, no murmurs, rubs or gallops, slightly tachycardic at  110 Abdomen: Soft, non-tender, non-distended, normoactive bowel sounds, no HSM Extremities: warm and well perfused, no clubbing, cyanosis, or edema, entire soles of feet are not erythematous no skin breakdown noted.  Her fingertips have no noted abnormality Skin: No rashes or lesions Neuro: Non-focal Breasts: Bilateral lumpectomy sites without nodularity, no masses or skin changes in either breast. ECOG PERFORMANCE STATUS: 0 - Asymptomatic  LABORATORY DATA: Lab Results  Component Value Date   WBC 0.4* 02/24/2013   HGB 10.4* 02/24/2013   HCT 31.0* 02/24/2013   MCV 93.7 02/24/2013   PLT 84* 02/24/2013      Chemistry      Component Value Date/Time   NA 142 02/17/2013 1120   NA 135 01/08/2013 0555   K 3.4* 02/17/2013 1120   K 4.0 01/08/2013 0555   CL 109* 02/17/2013 1120   CL 100 01/08/2013 0555   CO2 25 02/17/2013 1120   CO2 28 01/08/2013 0555   BUN 5.9* 02/17/2013 1120   BUN 10 01/08/2013 0555   CREATININE 0.7 02/17/2013 1120   CREATININE 0.62 01/08/2013 0555      Component Value Date/Time   CALCIUM 9.0 02/17/2013 1120   CALCIUM 9.1 01/08/2013 0555   ALKPHOS 62 02/17/2013 1120   ALKPHOS 67 01/05/2013 2245   AST 21 02/17/2013 1120   AST 10 01/05/2013 2245   ALT 19 02/17/2013 1120   ALT 15 01/05/2013 2245   BILITOT 0.23 02/17/2013 1120   BILITOT 0.5 01/05/2013 2245    ADDITIONAL INFORMATION: 1. CHROMOGENIC IN-SITU HYBRIDIZATION Results: HER-2/NEU BY CISH - NO AMPLIFICATION OF HER-2 DETECTED. RESULT RATIO OF HER2: CEP 17 SIGNALS 1.00 AVERAGE HER2 COPY NUMBER PER CELL 1.55 REFERENCE RANGE NEGATIVE HER2/Chr17 Ratio <2.0 and Average HER2 copy number <4.0 EQUIVOCAL HER2/Chr17 Ratio <2.0 and Average HER2 copy number 4.0 and <6.0 POSITIVE HER2/Chr17 Ratio >=2.0 and/or Average HER2 copy number >=6.0 Pecola Leisure MD Pathologist, Electronic Signature ( Signed 12/01/2012) 3. CHROMOGENIC IN-SITU HYBRIDIZATION Results: HER-2/NEU BY CISH - NO AMPLIFICATION OF HER-2 DETECTED. RESULT RATIO OF HER2: CEP 17  SIGNALS 1.22 AVERAGE HER2 COPY NUMBER PER CELL 1.40 REFERENCE RANGE NEGATIVE HER2/Chr17 Ratio <2.0 and Average HER2 copy number <4.0 EQUIVOCAL HER2/Chr17 Ratio <2.0 and Average HER2 copy number 4.0 and <6.0 POSITIVE  HER2/Chr17 Ratio >=2.0 and/or Average HER2 copy number >=6.0 1 of 5 FINAL for NOVELLA, ABRAHA ([id number]) ADDITIONAL INFORMATION:(continued) Pecola Leisure MD Pathologist, Electronic Signature ( Signed 12/01/2012) FINAL DIAGNOSIS Diagnosis 1. Breast, lumpectomy, Right - INVASIVE DUCTAL CARCINOMA, GRADE III, SEE COMMENT. - INVASIVE CARCINOMA IS 0.9 CM FROM NEAREST MARGIN (INFERIOR). - NO LYMPHOVASCULAR INVASION IDENTIFIED. - HIGH GRADE DUCTAL CARCINOMA IN SITU - IN SITU CARCINOMA IS 1 MM FROM THE NEAREST LATERAL MARGIN - SEE TUMOR SYNOPTIC TEMPLATE BELOW. 2. Lymph nodes, regional resection, Right axilla - ELEVEN LYMPH NODES, NEGATIVE FOR TUMOR (0/11). 3. Breast, lumpectomy, Left - INVASIVE DUCTAL CARCINOMA, GRADE I, SEE COMMENT. - LYMPHOVASCULAR INVASION IDENTIFIED - INVASIVE CARCINOMA IS BROADLY PRESENT AT POSTERIOR MARGIN - DUCTAL CARCINOMA IN SITU, GRADE I. - IN SITU CARCINOMA IS FOCALLY PRESENT AT POSTERIOR MARGIN - SEE TUMOR SYNOPTIC TEMPLATE BELOW. 4. Breast, excision, Left - BENIGN BREAST TISSUE, SEE COMMENT. - NEGATIVE FOR ATYPIA OR MALIGNANCY. - SURGICAL MARGIN, NEGATIVE FOR ATYPIA OR MALIGNANCY. 5. Lymph node, sentinel, biopsy, Left axillary - ONE LYMPH NODE, POSITIVE FOR METASTATIC MAMMARY CARCINOMA (1/1). Microscopic Comment 1. BREAST, INVASIVE TUMOR, WITH LYMPH NODE SAMPLING Specimen, including laterality: Right breast Procedure: Lumpectomy Grade: III of III Tubule formation: 3 Nuclear pleomorphism: 3 Mitotic: 3 Tumor size (gross measurement: 1.5 cm Margins: Invasive, distance to closest margin: 0.9 cm In-situ, distance to closest margin: 0.1 cm If margin positive, focally or broadly: N/A Lymphovascular invasion: Absent Ductal carcinoma in  situ: Present Grade: III of III Extensive intraductal component: Absent Lobular neoplasia: Absent Tumor focality: Multifocal Treatment effect: None 2 of 5 FINAL for NASIRAH, SACHS ([id number]) Microscopic Comment(continued) If present, treatment effect in breast tissue, lymph nodes or both: N/A Extent of tumor: Skin: N/A Nipple: N/A Skeletal muscle: N/A Lymph nodes: # examined: 11 (Part 2) Lymph nodes with metastasis: 0 Breast prognostic profile: Estrogen receptor: Repeated, previous study demonstrated 0% positivity ([id number]) Progesterone receptor: Repeated, previous study demonstrated 0% positivity ([id number]) Her 2 neu: Repeated, previous study demonstrated no amplification (1.38) ([id number]) Ki-67: Not repeated, previous study demonstrated 92% proliferation rate ([id number]) Non-neoplastic breast: Previous biopsy site, fibrocystic change, sclerosing adenosis and benign calcifications in benign ducts and lobules. TNM: pT1c, pN0, pMX Comments: There are a total of four distinct areas of abnormality identified. The first 1.5 cm corresponds to the invasive ductal carcinoma. The second 0.9 cm area consists of high grade ductal carcinoma in situ with associated fibrocystic change and fibroadenomatoid nodules. In this focus, the in situ carcinoma is 1 mm from the nearest lateral margin. The third 0.4 cm area consists of fat necrosis and fibrosis. There are no atypical or malignant findings in this area. The fourth and final 0.3 cm area demonstrate non-neoplastic findings to include fibrocystic change. There are no atypical or malignant epithelial findings in this area either. 3. BREAST, INVASIVE TUMOR, WITH LYMPH NODE SAMPLING Specimen, including laterality: Left breast Procedure: Lumpectomy Grade: I of III Tubule formation: 3 Nuclear pleomorphism: 3 Mitotic: 1 Tumor size (gross measurement): 1.0 cm Margins: Invasive, distance to closest margin: Present at  posterior In-situ, distance to closest margin: Present at posterior If margin positive, focally or broadly: Broadly (invasive) and focally (in situ) Lymphovascular invasion: Present Ductal carcinoma in situ: Present Grade: I of III Extensive intraductal component: Absent Lobular neoplasia: Absent Tumor focality: Unifocal Treatment effect: None If present, treatment effect in breast tissue, lymph nodes or both: N/A Extent of tumor: Skin: N/A Nipple: N/A Skeletal muscle: N/A Lymph nodes: # examined: 1 (Part 5)  Lymph nodes with metastasis: 1 Isolated tumor cells (< 0.2 mm): 0 Micrometastasis: (> 0.2 mm and < 2.0 mm): 1 Macrometastasis: (> 2.0 mm): 0 3 of 5 FINAL for HALANA, DEISHER ([id number]) Microscopic Comment(continued) Extracapsular extension: Present Breast prognostic profile: Estrogen receptor: Not repeated, previous study demonstrated 100% positivity ([id number]). Progesterone receptor: Not repeated, previous study demonstrated 25% positivity ([id number]) Her 2 neu: Repeated, previous study demonstrated no amplification (1.42) ([id number]) Ki-67: Not repeated, previous study demonstrated 9% proliferation rate ([id number]) Non-neoplastic breast: Previous biopsy site, fibrocystic change and microcalcifications in benign ducts and lobules. TNM: pT1b, pN56mi, PMX Comments: The additional 1.0 cm and 1.1 cm hemorrhagic areas grossly identified consist of benign breast tissue with microcalcifications and parenchymal hemorrhage. There are no atypical or malignant findings in these areas. 4. The surgical resection margin(s) of the specimen were inked and microscopically evaluated. There is no mass grossly identified. Representative sections demonstrate predominantly benign fibrovascular and adipose tissue with focal areas of fibrocytic change and sclerosing adenosis. There are no atypical or malignant epithelial or stromal findings identified. 5. There are microscopic foci of  intranodal metastatic mammary tumor deposits spanning up to 1 mm. There is focal extracapsular extension identified. (CR:kh 11-25-12) [place] RUND DO Pathologist, Electronic Signature (Case signed 11/25/2012) Specimen Gross and Clinical Information   RADIOGRAPHIC STUDIES:  Chest 2 View  11/19/2012  *RADIOLOGY REPORT*  Clinical Data: Preop for breast cancer  CHEST - 2 VIEW  Comparison: None.  Findings: Cardiomediastinal silhouette is unremarkable.  No acute infiltrate or pleural effusion.  No pulmonary edema.  Bony thorax is unremarkable.  IMPRESSION: No active disease.   Original Report Authenticated By: Natasha Mead, M.D.    Nm Sentinel Node Inj-no Rpt (breast)  11/23/2012  CLINICAL DATA: Bilateral breast cancer   Sulfur colloid was injected intradermally by the nuclear medicine  technologist for breast cancer sentinel node localization.     Dg Chest Portable 1 View  11/23/2012  *RADIOLOGY REPORT*  Clinical Data: 52 year old female status post Port-A-Cath placement.  Breast cancer.  PORTABLE CHEST - 1 VIEW  Comparison: 11/19/2012.  Findings: Portable semi upright AP view 1441 hours.  Right chest subclavian approach Port-A-Cath placed.  Acute angulation of the catheter tubing at the level of the right axilla.  Tip at the level of the cavoatrial junction.  Interval postoperative changes to the right chest wall with subcutaneous appearing drain in place.  No pneumothorax.  No pleural effusion or pulmonary edema.  Mildly increased bibasilar opacity compatible with atelectasis.  Cardiac size and mediastinal contours are within normal limits.  Visualized tracheal air column is within normal limits.  IMPRESSION: 1.  Right subclavian approach chest Port-A-Cath in place.  Tip at the level of the cavoatrial junction.  Mildly angulated catheter tubing at the level of the axilla. 2.  Interval postoperative changes to the right chest wall. No pneumothorax. 3.  Mild dependent atelectasis.   Original Report Authenticated  By: Erskine Speed, M.D.    Dg Fluoro Guide Cv Line-no Report  11/23/2012  CLINICAL DATA: bil breast cancer   FLOURO GUIDE CV LINE  Fluoroscopy was utilized by the requesting physician.  No radiographic  interpretation.     Mm Lt Plc Breast Loc Dev   1st Lesion  Inc Mammo Guide  11/23/2012  *RADIOLOGY REPORT*  Clinical Data:  Left breast cancer  NEEDLE LOCALIZATION WITH MAMMOGRAPHIC GUIDANCE AND SPECIMEN RADIOGRAPH  Comparison:  Previous exams.  Patient presents for needle localization prior to surgery.  I met with the  patient and we discussed the procedure of needle localization including benefits and alternatives. We discussed the high likelihood of a successful procedure. We discussed the risks of the procedure, including infection, bleeding, tissue injury, and further surgery. Informed, written consent was given.  Using mammographic guidance, sterile technique, 2% lidocaine and a #9 modified Kopans needle, the mass and clip in the central aspect of the left breast was localized using a superior approach.  The films are marked for Dr. Derrell Lolling.  Specimen radiograph was performed at [facility], and confirms the mass and clip were present in the tissue sample. The clip was located at the margin of the surgical specimen.  Dr.  Derrell Lolling states she had taken more tissue at the margin to clear the border.  The specimen is marked for pathology.  IMPRESSION: Needle localization of the left breast.  No apparent complications.   Original Report Authenticated By: Baird Lyons, M.D.    Mm Rt Plc Breast Loc Dev   1st Lesion  Inc Mammo Guide  11/23/2012  *RADIOLOGY REPORT*  Clinical Data:  Known right breast cancer  NEEDLE LOCALIZATION WITH MAMMOGRAPHIC GUIDANCE AND SPECIMEN RADIOGRAPH  Comparison:  Previous exams.  Patient presents for needle localization prior to surgery.  I met with the patient and we discussed the procedure of needle localization including benefits and alternatives. We discussed the high likelihood of a  successful procedure. We discussed the risks of the procedure, including infection, bleeding, tissue injury, and further surgery. Informed, written consent was given.  Using mammographic guidance, sterile technique, 2% lidocaine and a #7 and #9 modified Kopans needles, the mass and nodularity in the upper outer quadrant of the right breast was bracketedusing a lateral approach.  The films are marked for Dr. Derrell Lolling.  Specimen radiograph was performed at Day Surgery, and confirms the mass and clip are present in the tissue sample.  The specimen is marked for pathology.  IMPRESSION: Needle localization of the right breast.  No apparent complications.   Original Report Authenticated By: Baird Lyons, M.D.     ASSESSMENT: 52 year old female with  #1 bilateral breast cancers triple negative on the right side ER positive on the left side. She is status post bilateral lumpectomies. Clinically she's doing well. She will receive Adriamycin Cytoxan given dose dense x4 cycles followed by Taxol carbo x12 cycles.  Rationale risks benefits and side effects were discussed.  Patient did develop febrile neutropenia after first cycle, should it recur, a dose reduction will be considered.   #2 patient has had an echocardiogram Port-A-Cath placed and chemotherapy teaching class.  #3 Blepharitis   PLAN:   #1 Doing well.  She is subsequently neutropenic after chemotherapy.  She will restart Cipro today.  I reviewed neutropenic precautions with her and gave her a handout in her AVS.    2. She will see [place] back in 2 weeks for the start of Taxol/Carbo.   3. I recommended she increase her fluid intake.  She is not dizzy.      All questions were answered. The patient knows to call the clinic with any problems, questions or concerns. We can certainly see the patient much sooner if necessary.  I spent 25 minutes counseling the patient face to face. The total time spent in the appointment was 30 minutes.  Cherie Ouch  Lyn Hollingshead, NP Medical Oncology [facility] Phone: [phone] 02/24/2013, 11:56 AM

## 2013-02-24 NOTE — Patient Instructions (Signed)
  Patient Neutropenia Instruction Sheet  Diagnosis: Breast Cancer      Treating Physician: Drue Second, MD  Treatment: 1. Type of chemotherapy: AC 2. Date of last treatment: 02/17/13  Last Blood Counts: Lab Results  Component Value Date   WBC 0.4* 02/24/2013   HGB 10.4* 02/24/2013   HCT 31.0* 02/24/2013   MCV 93.7 02/24/2013   PLT 84* 02/24/2013  ANC 0      Prophylactic Antibiotics: Cipro 500 mg by mouth twice a day Instructions: 1. Monitor temperature and call if fever  greater than 100.5, chills, shaking chills (rigors) 2. Call Physician on-call at [phone] 3. Give him/her symptoms and list of medications that you are taking and your last blood count.

## 2013-03-10 ENCOUNTER — Other Ambulatory Visit (HOSPITAL_BASED_OUTPATIENT_CLINIC_OR_DEPARTMENT_OTHER): Payer: BC Managed Care – PPO | Admitting: Lab

## 2013-03-10 ENCOUNTER — Encounter: Payer: Self-pay | Admitting: Adult Health

## 2013-03-10 ENCOUNTER — Other Ambulatory Visit: Payer: Self-pay | Admitting: Oncology

## 2013-03-10 ENCOUNTER — Ambulatory Visit (HOSPITAL_BASED_OUTPATIENT_CLINIC_OR_DEPARTMENT_OTHER): Payer: BC Managed Care – PPO | Admitting: Adult Health

## 2013-03-10 ENCOUNTER — Ambulatory Visit (HOSPITAL_BASED_OUTPATIENT_CLINIC_OR_DEPARTMENT_OTHER): Payer: BC Managed Care – PPO

## 2013-03-10 ENCOUNTER — Encounter: Payer: Self-pay | Admitting: Oncology

## 2013-03-10 VITALS — BP 132/82 | HR 98 | Temp 98.0°F | Resp 20 | Ht 63.0 in | Wt 190.4 lb

## 2013-03-10 VITALS — BP 135/74 | HR 92 | Temp 98.4°F | Resp 18

## 2013-03-10 DIAGNOSIS — H01009 Unspecified blepharitis unspecified eye, unspecified eyelid: Secondary | ICD-10-CM

## 2013-03-10 DIAGNOSIS — Z170 Estrogen receptor positive status [ER+]: Secondary | ICD-10-CM

## 2013-03-10 DIAGNOSIS — C50411 Malignant neoplasm of upper-outer quadrant of right female breast: Secondary | ICD-10-CM

## 2013-03-10 DIAGNOSIS — C50919 Malignant neoplasm of unspecified site of unspecified female breast: Secondary | ICD-10-CM

## 2013-03-10 DIAGNOSIS — C50419 Malignant neoplasm of upper-outer quadrant of unspecified female breast: Secondary | ICD-10-CM

## 2013-03-10 DIAGNOSIS — Z5111 Encounter for antineoplastic chemotherapy: Secondary | ICD-10-CM

## 2013-03-10 LAB — CBC WITH DIFFERENTIAL/PLATELET
EOS%: 0.3 % (ref 0.0–7.0)
Eosinophils Absolute: 0 10*3/uL (ref 0.0–0.5)
MCV: 96.5 fL (ref 79.5–101.0)
MONO%: 25.3 % — ABNORMAL HIGH (ref 0.0–14.0)
NEUT#: 3.1 10*3/uL (ref 1.5–6.5)
RBC: 3.17 10*6/uL — ABNORMAL LOW (ref 3.70–5.45)
RDW: 18.8 % — ABNORMAL HIGH (ref 11.2–14.5)
lymph#: 1.3 10*3/uL (ref 0.9–3.3)
nRBC: 0 % (ref 0–0)

## 2013-03-10 LAB — COMPREHENSIVE METABOLIC PANEL (CC13)
ALT: 17 U/L (ref 0–55)
Alkaline Phosphatase: 70 U/L (ref 40–150)
Sodium: 140 mEq/L (ref 136–145)
Total Bilirubin: 0.23 mg/dL (ref 0.20–1.20)
Total Protein: 6.1 g/dL — ABNORMAL LOW (ref 6.4–8.3)

## 2013-03-10 MED ORDER — HEPARIN SOD (PORK) LOCK FLUSH 100 UNIT/ML IV SOLN
500.0000 [IU] | Freq: Once | INTRAVENOUS | Status: AC | PRN
  Administered 2013-03-10: 500 [IU]
  Filled 2013-03-10: qty 5

## 2013-03-10 MED ORDER — ACETAMINOPHEN 325 MG PO TABS
650.0000 mg | ORAL_TABLET | Freq: Once | ORAL | Status: AC
  Administered 2013-03-10: 650 mg via ORAL

## 2013-03-10 MED ORDER — RANITIDINE HCL 50 MG/2ML IJ SOLN
50.0000 mg | Freq: Once | INTRAVENOUS | Status: AC
  Administered 2013-03-10: 50 mg via INTRAVENOUS
  Filled 2013-03-10: qty 2

## 2013-03-10 MED ORDER — SODIUM CHLORIDE 0.9 % IV SOLN
Freq: Once | INTRAVENOUS | Status: AC
  Administered 2013-03-10: 13:00:00 via INTRAVENOUS

## 2013-03-10 MED ORDER — FAMOTIDINE IN NACL 20-0.9 MG/50ML-% IV SOLN: 20.0000 mg | Freq: Once | INTRAVENOUS | Status: DC

## 2013-03-10 MED ORDER — SODIUM CHLORIDE 0.9 % IV SOLN
80.0000 mg/m2 | Freq: Once | INTRAVENOUS | Status: AC
  Administered 2013-03-10: 156 mg via INTRAVENOUS
  Filled 2013-03-10: qty 26

## 2013-03-10 MED ORDER — DIPHENHYDRAMINE HCL 50 MG/ML IJ SOLN
50.0000 mg | Freq: Once | INTRAMUSCULAR | Status: AC
  Administered 2013-03-10: 50 mg via INTRAVENOUS

## 2013-03-10 MED ORDER — SODIUM CHLORIDE 0.9 % IJ SOLN
10.0000 mL | INTRAMUSCULAR | Status: DC | PRN
  Administered 2013-03-10: 10 mL
  Filled 2013-03-10: qty 10

## 2013-03-10 MED ORDER — DEXAMETHASONE SODIUM PHOSPHATE 20 MG/5ML IJ SOLN
20.0000 mg | Freq: Once | INTRAMUSCULAR | Status: AC
  Administered 2013-03-10: 20 mg via INTRAVENOUS

## 2013-03-10 MED ORDER — SODIUM CHLORIDE 0.9 % IV SOLN
300.0000 mg | Freq: Once | INTRAVENOUS | Status: AC
  Administered 2013-03-10: 300 mg via INTRAVENOUS
  Filled 2013-03-10: qty 30

## 2013-03-10 MED ORDER — PROCHLORPERAZINE 25 MG RE SUPP: 25.0000 mg | Freq: Two times a day (BID) | RECTAL | Status: DC | PRN

## 2013-03-10 MED ORDER — ONDANSETRON 16 MG/50ML IVPB (CHCC)
16.0000 mg | Freq: Once | INTRAVENOUS | Status: AC
  Administered 2013-03-10: 16 mg via INTRAVENOUS

## 2013-03-10 NOTE — Progress Notes (Signed)
1445 Patient with lower right side back pain 6/10 on pain scale, states this is not a new pain for her, having difficulty getting comfortable, she tried walking, stretching and given cold pack with no relief, asking for tylenol. States she takes tylenol at home for the back pain. Reports no other symptoms. [name], NP ok to give tylenol 650 mg.  1530 Patient's resting comfortably, sleeping. No complaints at this time. Taxol infusing at goal rate. VSS.  1542 Patient awake and reports pain in back a 2/10.

## 2013-03-10 NOTE — Patient Instructions (Addendum)
[facility] Discharge Instructions for Patients Receiving Chemotherapy  Today you received the following chemotherapy agents Taxol and Carboplatin.  To help prevent nausea and vomiting after your treatment, we encourage you to take your nausea medication.   If you develop nausea and vomiting that is not controlled by your nausea medication, call the clinic.   BELOW ARE SYMPTOMS THAT SHOULD BE REPORTED IMMEDIATELY:  *FEVER GREATER THAN 100.5 F  *CHILLS WITH OR WITHOUT FEVER  NAUSEA AND VOMITING THAT IS NOT CONTROLLED WITH YOUR NAUSEA MEDICATION  *UNUSUAL SHORTNESS OF BREATH  *UNUSUAL BRUISING OR BLEEDING  TENDERNESS IN MOUTH AND THROAT WITH OR WITHOUT PRESENCE OF ULCERS  *URINARY PROBLEMS  *BOWEL PROBLEMS  UNUSUAL RASH Items with * indicate a potential emergency and should be followed up as soon as possible.  Feel free to call the clinic you have any questions or concerns. The clinic phone number is [phone].    

## 2013-03-10 NOTE — Progress Notes (Signed)
OFFICE PROGRESS NOTE  CC  Amy Manns, MD [address] [place] [address] [place] [address] Dr. Claud Kelp  Dr. Antony Blackbird  DIAGNOSIS: 52 year old female with new diagnosis of bilateral breast cancers.   STAGE:  Cancer of upper-outer quadrant of female breast  Primary site: Breast (Bilateral)  Staging method: AJCC 7th Edition  Clinical: Stage IA (T1c, N0, cM0)  Summary: Stage IA (T1c, N0, cM0)   PRIOR THERAPY: #1Patient recently underwent screening mammograms and she was found to have bilateral breast abnormalities.diagnostic mammogram showed in the right breast an ill-defined 2 cm mass with a few associated well defined microcalcifications over the upper outer quadrant. Spot compression images of the left breast demonstrated an ill defined spiculated 8 mm mass centrally. Ultrasound showed irregular bordered heterogeneous hypoechoic solid mass at the 10:00 position of the right breast 6 cm from the nipple measuring 1.1 x 1.3 x 1.4 cm. Also in this location was an adjacent smaller irregular hypoechoic mass measuring 3 x 5 x 6 mm. The smaller mass was 1 cm from the large more irregular mass. Ultrasound of the axilla demonstrated single lymph node with thickened cortex. Ultrasound of the left breast demonstrated an ill-defined hypoechoic mass with distal acoustic shadowing 7 to 8:00 position 3 cm from the nipple located deep and measuring 5 x 8 x 8 mm ultrasound of the left axilla showed normal appearing lymph nodes.   #2Patient went on to have needle core biopsies performed of both of the masses. 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 The tumor was ER positive PR positive with a Ki-67 of 9% the second tumor was ER negative PR negative HER-2/neu negative with Ki-6792% and elevated.  #3 Patient had MRIs of the breasts performed. There was  noted to be 1.7 cm irregular enhancing mass located within the upper-outer quadrant of the right breast with 3 adjacent worrisome enhancing satellite nodules in aggregate the nodules and mass measures 3.8 cm. In the left breast 8 mm irregular enhancing mass located within the central left breast was noted. No evidence of axillary or internal mammary adenopathy.  #4patient is status post bilateral lumpectomies. The right breast revealed a 1.5 cm grade 3 invasive ductal carcinoma ER/PR negative HER-2/neu negative Ki-67 92%. Sentinel node was negative. Left breast showed 1.0 cm grade 3 invasive ductal carcinoma ER/PR positive HER-2/neu negative sentinel node was negative.  #5 patient is to begin adjuvant chemotherapy consisting of Adriamycin Cytoxan every 2 weeks for a total of 4 cycles 12/30/12. This would then be followed by Taxol and carboplatinum weekly for 12 weeks. Patient will need adjuvant radiation therapy. Because one of the tumor is ER positive she will also receive antiestrogen therapy.  CURRENT THERAPY: Taxol Carbo week one  INTERVAL HISTORY: Amy Leonard 52 y.o. female returns for followup visit prior to her first dose of taxol/carbo.  She is doing well today.  She denies fevers, chills, nausea, vomiting, constipation, diarrhea, numbness.  She did have some skin peeling of her feet, and continued drainage of her eyes--most noticeable when outdoors.  She has continued to apply warm compresses and use visine.  Otherwise, a 10 point ROS is neg.      MEDICAL HISTORY: Past Medical History  Diagnosis Date  . Elevated WBC count     nl diff  . Chronic back pain     spinal  stenosis  . Tobacco abuse   . Anxiety and depression   . HSV infection     recurrent (side and buttox)  . Migraine   . Glaucoma     ?  . Carpal tunnel syndrome   . HLD (hyperlipidemia)   . Folliculitis 05/05/2011  . Anxiety   . Depression   . 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 GERD (gastroesophageal reflux  disease)   . Hypertension   . Breast cancer   . Wears dentures     top    ALLERGIES:  is allergic to bupropion hcl; chlorhexidine gluconate; hydrocod polst-cpm polst er; lisinopril; and naproxen sodium.  MEDICATIONS:  Current Outpatient Prescriptions  Medication Sig Dispense Refill  . Acetaminophen (TYLENOL ARTHRITIS PAIN PO) Take 2 tablets by mouth as needed (pain).       Marland Kitchen acyclovir (ZOVIRAX) 5 % ointment Apply 1 application topically as needed (flare-up).       Marland Kitchen aspirin 81 MG chewable tablet Chew 81 mg by mouth daily.      . ATIVAN 0.5 MG tablet Take 1 tablet (0.5 mg total) by mouth every 6 (six) hours as needed (Nausea or vomiting).  45 tablet  0  . atorvastatin (LIPITOR) 10 MG tablet Take 1 tablet (10 mg total) by mouth daily.  90 tablet  1  . calcium carbonate (TUMS - DOSED IN MG ELEMENTAL CALCIUM) 500 MG chewable tablet Chew by mouth as directed.        . ciprofloxacin (CIPRO) 500 MG tablet Take 1 tablet (500 mg total) by mouth 2 (two) times daily.  14 tablet  3  . COMPRO 25 MG suppository       . cyclobenzaprine (FLEXERIL) 10 MG tablet Take 10 mg by mouth as needed (muscle spasm).       Marland Kitchen dexamethasone (DECADRON) 4 MG tablet       . fluconazole (DIFLUCAN) 100 MG tablet Take 2 tablets (200 mg total) by mouth daily.  14 tablet  0  . lidocaine-prilocaine (EMLA) cream Apply topically as needed.  30 g  8  . losartan (COZAAR) 50 MG tablet Take 1 tablet (50 mg total) by mouth daily.  90 tablet  2  . nystatin (MYCOSTATIN) 100000 UNIT/ML suspension Take 5 mLs (500,000 Units total) by mouth 2 (two) times daily.  240 mL  3  . omeprazole (PRILOSEC) 40 MG capsule Take 1 capsule (40 mg total) by mouth 2 (two) times daily.  180 capsule  3  . ondansetron (ZOFRAN) 8 MG tablet       . potassium chloride SA (K-DUR,KLOR-CON) 20 MEQ tablet Take 1 tablet (20 mEq total) by mouth daily.  7 tablet  0  . prochlorperazine (COMPAZINE) 10 MG tablet       . simethicone (MYLICON) 80 MG chewable tablet Chew 1  tablet (80 mg total) by mouth every 8 (eight) hours as needed for flatulence.  60 tablet  3   No current facility-administered medications for this visit.    SURGICAL HISTORY:  Past Surgical History  Procedure Laterality Date  . Btl    . Epidural steroid injection  2008  . Colonoscopy    . Upper gi endoscopy    . Partial mastectomy with needle localization and axillary sentinel lymph node bx Bilateral 11/23/2012    Procedure: PARTIAL MASTECTOMY WITH NEEDLE LOCALIZATION AND AXILLARY SENTINEL LYMPH NODE BX;  Surgeon: Ernestene Mention, MD;  Location: [facility];  Service: General;  Laterality: Bilateral;  . Portacath placement  Right 11/23/2012    Procedure: INSERTION PORT-A-CATH;  Surgeon: Ernestene Mention, MD;  Location: [facility];  Service: General;  Laterality: Right;    REVIEW OF SYSTEMS:  Pertinent items are noted in HPI.   PHYSICAL EXAMINATION: Blood pressure 132/82, pulse 98, temperature 98 F (36.7 C), temperature source Oral, resp. rate 20, height 5\' 3"  (1.6 m), weight 190 lb 6.4 oz (86.365 kg). Body mass index is 33.74 kg/(m^2). General: Patient is a well appearing female in no acute distress HEENT: PERRLA, no scleral injection, crusting, or puffiness of eyes noted.  sclerae anicteric no conjunctival pallor, MMM Neck: supple, no palpable adenopathy Lungs: clear to auscultation bilaterally, no wheezes, rhonchi, or rales Cardiovascular: regular rhythm, S1, S2, no murmurs, rubs or gallops, slightly tachycardic at 110 Abdomen: Soft, non-tender, non-distended, normoactive bowel sounds, no HSM Extremities: warm and well perfused, no clubbing, cyanosis, or edema, entire soles of feet are not erythematous no skin breakdown noted, slight peeling on sole of left foot, well moisturizied.  Her fingertips have no noted abnormality Skin: No rashes or lesions Neuro: Non-focal Breasts: Bilateral lumpectomy sites without nodularity, no masses or skin changes in  either breast. ECOG PERFORMANCE STATUS: 0 - Asymptomatic  LABORATORY DATA: Lab Results  Component Value Date   WBC 6.0 03/10/2013   HGB 10.3* 03/10/2013   HCT 30.6* 03/10/2013   MCV 96.5 03/10/2013   PLT 468* 03/10/2013      Chemistry      Component Value Date/Time   NA 136 02/24/2013 1125   NA 135 01/08/2013 0555   K 4.2 02/24/2013 1125   K 4.0 01/08/2013 0555   CL 109* 02/17/2013 1120   CL 100 01/08/2013 0555   CO2 25 02/24/2013 1125   CO2 28 01/08/2013 0555   BUN 13.3 02/24/2013 1125   BUN 10 01/08/2013 0555   CREATININE 0.7 02/24/2013 1125   CREATININE 0.62 01/08/2013 0555      Component Value Date/Time   CALCIUM 8.9 02/24/2013 1125   CALCIUM 9.1 01/08/2013 0555   ALKPHOS 57 02/24/2013 1125   ALKPHOS 67 01/05/2013 2245   AST 8 02/24/2013 1125   AST 10 01/05/2013 2245   ALT 16 02/24/2013 1125   ALT 15 01/05/2013 2245   BILITOT 0.84 02/24/2013 1125   BILITOT 0.5 01/05/2013 2245    ADDITIONAL INFORMATION: 1. CHROMOGENIC IN-SITU HYBRIDIZATION Results: HER-2/NEU BY CISH - NO AMPLIFICATION OF HER-2 DETECTED. RESULT RATIO OF HER2: CEP 17 SIGNALS 1.00 AVERAGE HER2 COPY NUMBER PER CELL 1.55 REFERENCE RANGE NEGATIVE HER2/Chr17 Ratio <2.0 and Average HER2 copy number <4.0 EQUIVOCAL HER2/Chr17 Ratio <2.0 and Average HER2 copy number 4.0 and <6.0 POSITIVE HER2/Chr17 Ratio >=2.0 and/or Average HER2 copy number >=6.0 Pecola Leisure MD Pathologist, Electronic Signature ( Signed 12/01/2012) 3. CHROMOGENIC IN-SITU HYBRIDIZATION Results: HER-2/NEU BY CISH - NO AMPLIFICATION OF HER-2 DETECTED. RESULT RATIO OF HER2: CEP 17 SIGNALS 1.22 AVERAGE HER2 COPY NUMBER PER CELL 1.40 REFERENCE RANGE NEGATIVE HER2/Chr17 Ratio <2.0 and Average HER2 copy number <4.0 EQUIVOCAL HER2/Chr17 Ratio <2.0 and Average HER2 copy number 4.0 and <6.0 POSITIVE HER2/Chr17 Ratio >=2.0 and/or Average HER2 copy number >=6.0 1 of 5 FINAL for JENALYN, GIRDNER ([id number]) ADDITIONAL INFORMATION:(continued) Pecola Leisure MD Pathologist,  Electronic Signature ( Signed 12/01/2012) FINAL DIAGNOSIS Diagnosis 1. Breast, lumpectomy, Right - INVASIVE DUCTAL CARCINOMA, GRADE III, SEE COMMENT. - INVASIVE CARCINOMA IS 0.9 CM FROM NEAREST MARGIN (INFERIOR). - NO LYMPHOVASCULAR INVASION IDENTIFIED. - HIGH GRADE DUCTAL CARCINOMA IN SITU - IN SITU CARCINOMA IS  1 MM FROM THE NEAREST LATERAL MARGIN - SEE TUMOR SYNOPTIC TEMPLATE BELOW. 2. Lymph nodes, regional resection, Right axilla - ELEVEN LYMPH NODES, NEGATIVE FOR TUMOR (0/11). 3. Breast, lumpectomy, Left - INVASIVE DUCTAL CARCINOMA, GRADE I, SEE COMMENT. - LYMPHOVASCULAR INVASION IDENTIFIED - INVASIVE CARCINOMA IS BROADLY PRESENT AT POSTERIOR MARGIN - DUCTAL CARCINOMA IN SITU, GRADE I. - IN SITU CARCINOMA IS FOCALLY PRESENT AT POSTERIOR MARGIN - SEE TUMOR SYNOPTIC TEMPLATE BELOW. 4. Breast, excision, Left - BENIGN BREAST TISSUE, SEE COMMENT. - NEGATIVE FOR ATYPIA OR MALIGNANCY. - SURGICAL MARGIN, NEGATIVE FOR ATYPIA OR MALIGNANCY. 5. Lymph node, sentinel, biopsy, Left axillary - ONE LYMPH NODE, POSITIVE FOR METASTATIC MAMMARY CARCINOMA (1/1). Microscopic Comment 1. BREAST, INVASIVE TUMOR, WITH LYMPH NODE SAMPLING Specimen, including laterality: Right breast Procedure: Lumpectomy Grade: III of III Tubule formation: 3 Nuclear pleomorphism: 3 Mitotic: 3 Tumor size (gross measurement: 1.5 cm Margins: Invasive, distance to closest margin: 0.9 cm In-situ, distance to closest margin: 0.1 cm If margin positive, focally or broadly: N/A Lymphovascular invasion: Absent Ductal carcinoma in situ: Present Grade: III of III Extensive intraductal component: Absent Lobular neoplasia: Absent Tumor focality: Multifocal Treatment effect: None 2 of 5 FINAL for JENNIEFER, SALAK ([id number]) Microscopic Comment(continued) If present, treatment effect in breast tissue, lymph nodes or both: N/A Extent of tumor: Skin: N/A Nipple: N/A Skeletal muscle: N/A Lymph nodes: # examined:  11 (Part 2) Lymph nodes with metastasis: 0 Breast prognostic profile: Estrogen receptor: Repeated, previous study demonstrated 0% positivity ([id number]) Progesterone receptor: Repeated, previous study demonstrated 0% positivity ([id number]) Her 2 neu: Repeated, previous study demonstrated no amplification (1.38) ([id number]) Ki-67: Not repeated, previous study demonstrated 92% proliferation rate ([id number]) Non-neoplastic breast: Previous biopsy site, fibrocystic change, sclerosing adenosis and benign calcifications in benign ducts and lobules. TNM: pT1c, pN0, pMX Comments: There are a total of four distinct areas of abnormality identified. The first 1.5 cm corresponds to the invasive ductal carcinoma. The second 0.9 cm area consists of high grade ductal carcinoma in situ with associated fibrocystic change and fibroadenomatoid nodules. In this focus, the in situ carcinoma is 1 mm from the nearest lateral margin. The third 0.4 cm area consists of fat necrosis and fibrosis. There are no atypical or malignant findings in this area. The fourth and final 0.3 cm area demonstrate non-neoplastic findings to include fibrocystic change. There are no atypical or malignant epithelial findings in this area either. 3. BREAST, INVASIVE TUMOR, WITH LYMPH NODE SAMPLING Specimen, including laterality: Left breast Procedure: Lumpectomy Grade: I of III Tubule formation: 3 Nuclear pleomorphism: 3 Mitotic: 1 Tumor size (gross measurement): 1.0 cm Margins: Invasive, distance to closest margin: Present at posterior In-situ, distance to closest margin: Present at posterior If margin positive, focally or broadly: Broadly (invasive) and focally (in situ) Lymphovascular invasion: Present Ductal carcinoma in situ: Present Grade: I of III Extensive intraductal component: Absent Lobular neoplasia: Absent Tumor focality: Unifocal Treatment effect: None If present, treatment effect in breast tissue, lymph nodes  or both: N/A Extent of tumor: Skin: N/A Nipple: N/A Skeletal muscle: N/A Lymph nodes: # examined: 1 (Part 5) Lymph nodes with metastasis: 1 Isolated tumor cells (< 0.2 mm): 0 Micrometastasis: (> 0.2 mm and < 2.0 mm): 1 Macrometastasis: (> 2.0 mm): 0 3 of 5 FINAL for MAKEBA, DELCASTILLO ([id number]) Microscopic Comment(continued) Extracapsular extension: Present Breast prognostic profile: Estrogen receptor: Not repeated, previous study demonstrated 100% positivity ([id number]). Progesterone receptor: Not repeated, previous study demonstrated 25% positivity ([id number]) Her 2 neu: Repeated, previous study demonstrated no  amplification (1.42) ([id number]) Ki-67: Not repeated, previous study demonstrated 9% proliferation rate ([id number]) Non-neoplastic breast: Previous biopsy site, fibrocystic change and microcalcifications in benign ducts and lobules. TNM: pT1b, pN51mi, PMX Comments: The additional 1.0 cm and 1.1 cm hemorrhagic areas grossly identified consist of benign breast tissue with microcalcifications and parenchymal hemorrhage. There are no atypical or malignant findings in these areas. 4. The surgical resection margin(s) of the specimen were inked and microscopically evaluated. There is no mass grossly identified. Representative sections demonstrate predominantly benign fibrovascular and adipose tissue with focal areas of fibrocytic change and sclerosing adenosis. There are no atypical or malignant epithelial or stromal findings identified. 5. There are microscopic foci of intranodal metastatic mammary tumor deposits spanning up to 1 mm. There is focal extracapsular extension identified. (CR:kh 11-25-12) [place] RUND DO Pathologist, Electronic Signature (Case signed 11/25/2012) Specimen Gross and Clinical Information   RADIOGRAPHIC STUDIES:  Chest 2 View  11/19/2012  *RADIOLOGY REPORT*  Clinical Data: Preop for breast cancer  CHEST - 2 VIEW  Comparison: None.  Findings:  Cardiomediastinal silhouette is unremarkable.  No acute infiltrate or pleural effusion.  No pulmonary edema.  Bony thorax is unremarkable.  IMPRESSION: No active disease.   Original Report Authenticated By: Natasha Mead, M.D.    Nm Sentinel Node Inj-no Rpt (breast)  11/23/2012  CLINICAL DATA: Bilateral breast cancer   Sulfur colloid was injected intradermally by the nuclear medicine  technologist for breast cancer sentinel node localization.     Dg Chest Portable 1 View  11/23/2012  *RADIOLOGY REPORT*  Clinical Data: 52 year old female status post Port-A-Cath placement.  Breast cancer.  PORTABLE CHEST - 1 VIEW  Comparison: 11/19/2012.  Findings: Portable semi upright AP view 1441 hours.  Right chest subclavian approach Port-A-Cath placed.  Acute angulation of the catheter tubing at the level of the right axilla.  Tip at the level of the cavoatrial junction.  Interval postoperative changes to the right chest wall with subcutaneous appearing drain in place.  No pneumothorax.  No pleural effusion or pulmonary edema.  Mildly increased bibasilar opacity compatible with atelectasis.  Cardiac size and mediastinal contours are within normal limits.  Visualized tracheal air column is within normal limits.  IMPRESSION: 1.  Right subclavian approach chest Port-A-Cath in place.  Tip at the level of the cavoatrial junction.  Mildly angulated catheter tubing at the level of the axilla. 2.  Interval postoperative changes to the right chest wall. No pneumothorax. 3.  Mild dependent atelectasis.   Original Report Authenticated By: Erskine Speed, M.D.    Dg Fluoro Guide Cv Line-no Report  11/23/2012  CLINICAL DATA: bil breast cancer   FLOURO GUIDE CV LINE  Fluoroscopy was utilized by the requesting physician.  No radiographic  interpretation.     Mm Lt Plc Breast Loc Dev   1st Lesion  Inc Mammo Guide  11/23/2012  *RADIOLOGY REPORT*  Clinical Data:  Left breast cancer  NEEDLE LOCALIZATION WITH MAMMOGRAPHIC GUIDANCE AND SPECIMEN  RADIOGRAPH  Comparison:  Previous exams.  Patient presents for needle localization prior to surgery.  I met with the patient and we discussed the procedure of needle localization including benefits and alternatives. We discussed the high likelihood of a successful procedure. We discussed the risks of the procedure, including infection, bleeding, tissue injury, and further surgery. Informed, written consent was given.  Using mammographic guidance, sterile technique, 2% lidocaine and a #9 modified Kopans needle, the mass and clip in the central aspect of the left breast was localized using  a superior approach.  The films are marked for Dr. Derrell Lolling.  Specimen radiograph was performed at Day Surgery, and confirms the mass and clip were present in the tissue sample. The clip was located at the margin of the surgical specimen.  Dr.  Derrell Lolling states she had taken more tissue at the margin to clear the border.  The specimen is marked for pathology.  IMPRESSION: Needle localization of the left breast.  No apparent complications.   Original Report Authenticated By: Baird Lyons, M.D.    Mm Rt Plc Breast Loc Dev   1st Lesion  Inc Mammo Guide  11/23/2012  *RADIOLOGY REPORT*  Clinical Data:  Known right breast cancer  NEEDLE LOCALIZATION WITH MAMMOGRAPHIC GUIDANCE AND SPECIMEN RADIOGRAPH  Comparison:  Previous exams.  Patient presents for needle localization prior to surgery.  I met with the patient and we discussed the procedure of needle localization including benefits and alternatives. We discussed the high likelihood of a successful procedure. We discussed the risks of the procedure, including infection, bleeding, tissue injury, and further surgery. Informed, written consent was given.  Using mammographic guidance, sterile technique, 2% lidocaine and a #7 and #9 modified Kopans needles, the mass and nodularity in the upper outer quadrant of the right breast was bracketedusing a lateral approach.  The films are marked for Dr.  Derrell Lolling.  Specimen radiograph was performed at Day Surgery, and confirms the mass and clip are present in the tissue sample.  The specimen is marked for pathology.  IMPRESSION: Needle localization of the right breast.  No apparent complications.   Original Report Authenticated By: Baird Lyons, M.D.     ASSESSMENT: 52 year old female with  #1 bilateral breast cancers triple negative on the right side ER positive on the left side. She is status post bilateral lumpectomies. Clinically she's doing well. She will receive Adriamycin Cytoxan given dose dense x4 cycles followed by Taxol carbo x12 cycles.  Rationale risks benefits and side effects were discussed.  Patient did develop febrile neutropenia after first cycle, should it recur, a dose reduction will be considered.   #2 patient has had an echocardiogram Port-A-Cath placed and chemotherapy teaching class.  #3 Blepharitis   PLAN:   #1 Doing well.  She will proceed with chemotherapy.  She and I reviewed her anti-emetics and she knows how to take them.  She will continue to moisturize her hands and feet with Aquaphor.   2. She will see [place] back next week for cycle 2 of chemo.  3. She will continue to use warm compresses/wash the lid and lashes with baby soap, and use visine.  In addition she will take long acting claritin.    All questions were answered. The patient knows to call the clinic with any problems, questions or concerns. We can certainly see the patient much sooner if necessary.  I spent 25 minutes counseling the patient face to face. The total time spent in the appointment was 30 minutes.  Cherie Ouch Lyn Hollingshead, NP Medical Oncology [facility] Phone: [phone] 03/10/2013, 12:01 PM

## 2013-03-10 NOTE — Patient Instructions (Addendum)
Doing well.  Proceed with chemotherapy.  Please call us if you have any questions or concerns.    Continue using warm compresses/baby soap on eyes twice a day and visine eye drops.  Try taking a 12 hour claritin.

## 2013-03-11 ENCOUNTER — Telehealth: Payer: Self-pay | Admitting: *Deleted

## 2013-03-11 NOTE — Telephone Encounter (Signed)
NO PROBLEMS OR QUESTIONS AT THIS TIME. ENCOURAGED PT. TO FORCE FLUIDS. PT. HAS THE INFORMATION SHEETS FOR HER CHEMOTHERAPY MEDICATIONS. SHE IS AWARE THERE IS A PHYSICIAN ON CALL AFTER HOURS, WEEKENDS, AND HOLIDAYS. PT. WILL CALL THIS OFFICE IF THE NEED ARISES.

## 2013-03-17 ENCOUNTER — Other Ambulatory Visit (HOSPITAL_BASED_OUTPATIENT_CLINIC_OR_DEPARTMENT_OTHER): Payer: BC Managed Care – PPO | Admitting: Lab

## 2013-03-17 ENCOUNTER — Ambulatory Visit (HOSPITAL_BASED_OUTPATIENT_CLINIC_OR_DEPARTMENT_OTHER): Payer: BC Managed Care – PPO | Admitting: Adult Health

## 2013-03-17 ENCOUNTER — Ambulatory Visit (HOSPITAL_BASED_OUTPATIENT_CLINIC_OR_DEPARTMENT_OTHER): Payer: BC Managed Care – PPO

## 2013-03-17 ENCOUNTER — Telehealth: Payer: Self-pay | Admitting: *Deleted

## 2013-03-17 ENCOUNTER — Encounter: Payer: Self-pay | Admitting: Adult Health

## 2013-03-17 VITALS — BP 126/80 | HR 97 | Temp 97.9°F | Resp 20 | Ht 63.0 in | Wt 190.0 lb

## 2013-03-17 DIAGNOSIS — H01009 Unspecified blepharitis unspecified eye, unspecified eyelid: Secondary | ICD-10-CM

## 2013-03-17 DIAGNOSIS — Z171 Estrogen receptor negative status [ER-]: Secondary | ICD-10-CM

## 2013-03-17 DIAGNOSIS — C50411 Malignant neoplasm of upper-outer quadrant of right female breast: Secondary | ICD-10-CM

## 2013-03-17 DIAGNOSIS — Z5111 Encounter for antineoplastic chemotherapy: Secondary | ICD-10-CM

## 2013-03-17 DIAGNOSIS — C50419 Malignant neoplasm of upper-outer quadrant of unspecified female breast: Secondary | ICD-10-CM

## 2013-03-17 DIAGNOSIS — C50912 Malignant neoplasm of unspecified site of left female breast: Secondary | ICD-10-CM

## 2013-03-17 DIAGNOSIS — C50919 Malignant neoplasm of unspecified site of unspecified female breast: Secondary | ICD-10-CM

## 2013-03-17 DIAGNOSIS — Z170 Estrogen receptor positive status [ER+]: Secondary | ICD-10-CM

## 2013-03-17 LAB — CBC WITH DIFFERENTIAL/PLATELET
BASO%: 0.2 % (ref 0.0–2.0)
Basophils Absolute: 0 10*3/uL (ref 0.0–0.1)
HCT: 32.1 % — ABNORMAL LOW (ref 34.8–46.6)
HGB: 10.8 g/dL — ABNORMAL LOW (ref 11.6–15.9)
LYMPH%: 17.6 % (ref 14.0–49.7)
MCHC: 33.6 g/dL (ref 31.5–36.0)
MONO#: 0.6 10*3/uL (ref 0.1–0.9)
NEUT%: 73.1 % (ref 38.4–76.8)
Platelets: 352 10*3/uL (ref 145–400)
WBC: 6.6 10*3/uL (ref 3.9–10.3)
lymph#: 1.2 10*3/uL (ref 0.9–3.3)

## 2013-03-17 LAB — COMPREHENSIVE METABOLIC PANEL (CC13)
Albumin: 3.5 g/dL (ref 3.5–5.0)
BUN: 13.2 mg/dL (ref 7.0–26.0)
CO2: 26 mEq/L (ref 22–29)
Calcium: 8.8 mg/dL (ref 8.4–10.4)
Chloride: 106 mEq/L (ref 98–109)
Creatinine: 0.6 mg/dL (ref 0.6–1.1)
Glucose: 91 mg/dl (ref 70–140)
Potassium: 3.8 mEq/L (ref 3.5–5.1)

## 2013-03-17 MED ORDER — SODIUM CHLORIDE 0.9 % IV SOLN
300.0000 mg | Freq: Once | INTRAVENOUS | Status: AC
  Administered 2013-03-17: 300 mg via INTRAVENOUS
  Filled 2013-03-17: qty 30

## 2013-03-17 MED ORDER — FAMOTIDINE IN NACL 20-0.9 MG/50ML-% IV SOLN
20.0000 mg | Freq: Once | INTRAVENOUS | Status: AC
  Administered 2013-03-17: 20 mg via INTRAVENOUS

## 2013-03-17 MED ORDER — DEXAMETHASONE 4 MG PO TABS: 4.0000 mg | ORAL_TABLET | ORAL | Status: DC

## 2013-03-17 MED ORDER — DIPHENHYDRAMINE HCL 50 MG/ML IJ SOLN
25.0000 mg | Freq: Once | INTRAMUSCULAR | Status: AC
  Administered 2013-03-17: 25 mg via INTRAVENOUS

## 2013-03-17 MED ORDER — ACETAMINOPHEN 325 MG PO TABS
650.0000 mg | ORAL_TABLET | Freq: Once | ORAL | Status: AC
  Administered 2013-03-17: 650 mg via ORAL

## 2013-03-17 MED ORDER — PACLITAXEL CHEMO INJECTION 300 MG/50ML
80.0000 mg/m2 | Freq: Once | INTRAVENOUS | Status: AC
  Administered 2013-03-17: 156 mg via INTRAVENOUS
  Filled 2013-03-17: qty 26

## 2013-03-17 MED ORDER — DEXAMETHASONE SODIUM PHOSPHATE 20 MG/5ML IJ SOLN
20.0000 mg | Freq: Once | INTRAMUSCULAR | Status: AC
  Administered 2013-03-17: 20 mg via INTRAVENOUS

## 2013-03-17 MED ORDER — ONDANSETRON 16 MG/50ML IVPB (CHCC)
16.0000 mg | Freq: Once | INTRAVENOUS | Status: AC
  Administered 2013-03-17: 16 mg via INTRAVENOUS

## 2013-03-17 MED ORDER — SODIUM CHLORIDE 0.9 % IV SOLN
Freq: Once | INTRAVENOUS | Status: AC
  Administered 2013-03-17: 13:00:00 via INTRAVENOUS

## 2013-03-17 NOTE — Progress Notes (Signed)
OFFICE PROGRESS NOTE  CC  [name], MD [address] [place] [address], [place] [address] Dr. Claud Kelp  Dr. Antony Blackbird  DIAGNOSIS: 52 year old female with new diagnosis of bilateral breast cancers.   STAGE:  Cancer of upper-outer quadrant of female breast  Primary site: Breast (Bilateral)  Staging method: AJCC 7th Edition  Clinical: Stage IA (T1c, N0, cM0)  Summary: Stage IA (T1c, N0, cM0)   PRIOR THERAPY: #1Patient recently underwent screening mammograms and she was found to have bilateral breast abnormalities.diagnostic mammogram showed in the right breast an ill-defined 2 cm mass with a few associated well defined microcalcifications over the upper outer quadrant. Spot compression images of the left breast demonstrated an ill defined spiculated 8 mm mass centrally. Ultrasound showed irregular bordered heterogeneous hypoechoic solid mass at the 10:00 position of the right breast 6 cm from the nipple measuring 1.1 x 1.3 x 1.4 cm. Also in this location was an adjacent smaller irregular hypoechoic mass measuring 3 x 5 x 6 mm. The smaller mass was 1 cm from the large more irregular mass. Ultrasound of the axilla demonstrated single lymph node with thickened cortex. Ultrasound of the left breast demonstrated an ill-defined hypoechoic mass with distal acoustic shadowing 7 to 8:00 position 3 cm from the nipple located deep and measuring 5 x 8 x 8 mm ultrasound of the left axilla showed normal appearing lymph nodes.   #2Patient went on to have needle core biopsies performed of both of the masses. 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 The tumor was ER positive PR positive with a Ki-67 of 9% the second tumor was ER negative PR negative HER-2/neu negative with Ki-6792% and elevated.  #3 Patient had MRIs of the breasts performed. There was  noted to be 1.7 cm irregular enhancing mass located within the upper-outer quadrant of the right breast with 3 adjacent worrisome enhancing satellite nodules in aggregate the nodules and mass measures 3.8 cm. In the left breast 8 mm irregular enhancing mass located within the central left breast was noted. No evidence of axillary or internal mammary adenopathy.  #4patient is status post bilateral lumpectomies. The right breast revealed a 1.5 cm grade 3 invasive ductal carcinoma ER/PR negative HER-2/neu negative Ki-67 92%. Sentinel node was negative. Left breast showed 1.0 cm grade 3 invasive ductal carcinoma ER/PR positive HER-2/neu negative sentinel node was negative.  #5 patient is to begin adjuvant chemotherapy consisting of Adriamycin Cytoxan every 2 weeks for a total of 4 cycles 12/30/12. This would then be followed by Taxol and carboplatinum weekly for 12 weeks. Patient will need adjuvant radiation therapy. Because one of the tumor is ER positive she will also receive antiestrogen therapy.  CURRENT THERAPY: Taxol Carbo week two  INTERVAL HISTORY: Amy Leonard 52 y.o. female returns for followup visit prior to her second week of Taxol/Carbo.  She is doing well today.  Her feet have continued to peel, and she is moisturizing them with Aquaphor.  She denies fevers, chills, numbness, nausea, vomiting, constipation, diarrhea, or any further concerns.     MEDICAL HISTORY: Past Medical History  Diagnosis Date  . Elevated WBC count     nl diff  . Chronic back pain     spinal stenosis  . Tobacco abuse   . Anxiety and depression   . HSV infection     recurrent (  side and buttox)  . Migraine   . Glaucoma     ?  . Carpal tunnel syndrome   . HLD (hyperlipidemia)   . Folliculitis 05/05/2011  . Anxiety   . Depression   . 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 GERD (gastroesophageal reflux disease)   . Hypertension   . Breast cancer   . Wears dentures     top    ALLERGIES:  is allergic to  bupropion hcl; chlorhexidine gluconate; hydrocod polst-cpm polst er; lisinopril; and naproxen sodium.  MEDICATIONS:  Current Outpatient Prescriptions  Medication Sig Dispense Refill  . Acetaminophen (TYLENOL ARTHRITIS PAIN PO) Take 2 tablets by mouth as needed (pain).       Marland Kitchen acyclovir (ZOVIRAX) 5 % ointment Apply 1 application topically as needed (flare-up).       Marland Kitchen aspirin 81 MG chewable tablet Chew 81 mg by mouth daily.      . ATIVAN 0.5 MG tablet Take 1 tablet (0.5 mg total) by mouth every 6 (six) hours as needed (Nausea or vomiting).  45 tablet  0  . atorvastatin (LIPITOR) 10 MG tablet Take 1 tablet (10 mg total) by mouth daily.  90 tablet  1  . calcium carbonate (TUMS - DOSED IN MG ELEMENTAL CALCIUM) 500 MG chewable tablet Chew by mouth as directed.        . ciprofloxacin (CIPRO) 500 MG tablet Take 1 tablet (500 mg total) by mouth 2 (two) times daily.  14 tablet  3  . COMPRO 25 MG suppository       . cyclobenzaprine (FLEXERIL) 10 MG tablet Take 10 mg by mouth as needed (muscle spasm).       Marland Kitchen dexamethasone (DECADRON) 4 MG tablet Take 1 tablet (4 mg total) by mouth as directed. 2 tablets PO the day after chemo then 2 tablets PO BID x 2 days.  30 tablet  4  . fluconazole (DIFLUCAN) 100 MG tablet Take 2 tablets (200 mg total) by mouth daily.  14 tablet  0  . lidocaine-prilocaine (EMLA) cream Apply topically as needed.  30 g  8  . losartan (COZAAR) 50 MG tablet Take 1 tablet (50 mg total) by mouth daily.  90 tablet  2  . nystatin (MYCOSTATIN) 100000 UNIT/ML suspension Take 5 mLs (500,000 Units total) by mouth 2 (two) times daily.  240 mL  3  . omeprazole (PRILOSEC) 40 MG capsule Take 1 capsule (40 mg total) by mouth 2 (two) times daily.  180 capsule  3  . ondansetron (ZOFRAN) 8 MG tablet       . potassium chloride SA (K-DUR,KLOR-CON) 20 MEQ tablet Take 1 tablet (20 mEq total) by mouth daily.  7 tablet  0  . prochlorperazine (COMPAZINE) 10 MG tablet       . prochlorperazine (COMPAZINE) 25 MG  suppository Place 1 suppository (25 mg total) rectally every 12 (twelve) hours as needed for nausea.  12 suppository  3  . simethicone (MYLICON) 80 MG chewable tablet Chew 1 tablet (80 mg total) by mouth every 8 (eight) hours as needed for flatulence.  60 tablet  3   No current facility-administered medications for this visit.    SURGICAL HISTORY:  Past Surgical History  Procedure Laterality Date  . Btl    . Epidural steroid injection  2008  . Colonoscopy    . Upper gi endoscopy    . Partial mastectomy with needle localization and axillary sentinel lymph node bx Bilateral 11/23/2012    Procedure: PARTIAL MASTECTOMY  WITH NEEDLE LOCALIZATION AND AXILLARY SENTINEL LYMPH NODE BX;  Surgeon: Ernestene Mention, MD;  Location: [facility];  Service: General;  Laterality: Bilateral;  . Portacath placement Right 11/23/2012    Procedure: INSERTION PORT-A-CATH;  Surgeon: Ernestene Mention, MD;  Location: [facility];  Service: General;  Laterality: Right;    REVIEW OF SYSTEMS:  Pertinent items are noted in HPI.   PHYSICAL EXAMINATION: Blood pressure 126/80, pulse 97, temperature 97.9 F (36.6 C), temperature source Oral, resp. rate 20, height 5\' 3"  (1.6 m), weight 190 lb (86.183 kg). Body mass index is 33.67 kg/(m^2). General: Patient is a well appearing female in no acute distress HEENT: PERRLA, no scleral injection, crusting, or puffiness of eyes noted.  sclerae anicteric no conjunctival pallor, MMM Neck: supple, no palpable adenopathy Lungs: clear to auscultation bilaterally, no wheezes, rhonchi, or rales Cardiovascular: regular rhythm, S1, S2, no murmurs, rubs or gallops Abdomen: Soft, non-tender, non-distended, normoactive bowel sounds, no HSM Extremities: warm and well perfused, no clubbing, cyanosis, or edema, entire soles of feet are not erythematous no skin breakdown noted, slight peeling on sole of left foot, well moisturizied.  Her fingertips have no noted  abnormality Skin: No rashes or lesions Neuro: Non-focal Breasts: Bilateral lumpectomy sites without nodularity, no masses or skin changes in either breast. ECOG PERFORMANCE STATUS: 0 - Asymptomatic  LABORATORY DATA: Lab Results  Component Value Date   WBC 6.6 03/17/2013   HGB 10.8* 03/17/2013   HCT 32.1* 03/17/2013   MCV 97.0 03/17/2013   PLT 352 03/17/2013      Chemistry      Component Value Date/Time   NA 139 03/17/2013 1112   NA 135 01/08/2013 0555   K 3.8 03/17/2013 1112   K 4.0 01/08/2013 0555   CL 109* 02/17/2013 1120   CL 100 01/08/2013 0555   CO2 26 03/17/2013 1112   CO2 28 01/08/2013 0555   BUN 13.2 03/17/2013 1112   BUN 10 01/08/2013 0555   CREATININE 0.6 03/17/2013 1112   CREATININE 0.62 01/08/2013 0555      Component Value Date/Time   CALCIUM 8.8 03/17/2013 1112   CALCIUM 9.1 01/08/2013 0555   ALKPHOS 57 03/17/2013 1112   ALKPHOS 67 01/05/2013 2245   AST 16 03/17/2013 1112   AST 10 01/05/2013 2245   ALT 21 03/17/2013 1112   ALT 15 01/05/2013 2245   BILITOT 0.30 03/17/2013 1112   BILITOT 0.5 01/05/2013 2245    ADDITIONAL INFORMATION: 1. CHROMOGENIC IN-SITU HYBRIDIZATION Results: HER-2/NEU BY CISH - NO AMPLIFICATION OF HER-2 DETECTED. RESULT RATIO OF HER2: CEP 17 SIGNALS 1.00 AVERAGE HER2 COPY NUMBER PER CELL 1.55 REFERENCE RANGE NEGATIVE HER2/Chr17 Ratio <2.0 and Average HER2 copy number <4.0 EQUIVOCAL HER2/Chr17 Ratio <2.0 and Average HER2 copy number 4.0 and <6.0 POSITIVE HER2/Chr17 Ratio >=2.0 and/or Average HER2 copy number >=6.0 Pecola Leisure MD Pathologist, Electronic Signature ( Signed 12/01/2012) 3. CHROMOGENIC IN-SITU HYBRIDIZATION Results: HER-2/NEU BY CISH - NO AMPLIFICATION OF HER-2 DETECTED. RESULT RATIO OF HER2: CEP 17 SIGNALS 1.22 AVERAGE HER2 COPY NUMBER PER CELL 1.40 REFERENCE RANGE NEGATIVE HER2/Chr17 Ratio <2.0 and Average HER2 copy number <4.0 EQUIVOCAL HER2/Chr17 Ratio <2.0 and Average HER2 copy number 4.0 and <6.0 POSITIVE HER2/Chr17 Ratio >=2.0  and/or Average HER2 copy number >=6.0 1 of 5 FINAL for Amy Leonard, Amy Leonard ([id number]) ADDITIONAL INFORMATION:(continued) Pecola Leisure MD Pathologist, Electronic Signature ( Signed 12/01/2012) FINAL DIAGNOSIS Diagnosis 1. Breast, lumpectomy, Right - INVASIVE DUCTAL CARCINOMA, GRADE III, SEE COMMENT. - INVASIVE  CARCINOMA IS 0.9 CM FROM NEAREST MARGIN (INFERIOR). - NO LYMPHOVASCULAR INVASION IDENTIFIED. - HIGH GRADE DUCTAL CARCINOMA IN SITU - IN SITU CARCINOMA IS 1 MM FROM THE NEAREST LATERAL MARGIN - SEE TUMOR SYNOPTIC TEMPLATE BELOW. 2. Lymph nodes, regional resection, Right axilla - ELEVEN LYMPH NODES, NEGATIVE FOR TUMOR (0/11). 3. Breast, lumpectomy, Left - INVASIVE DUCTAL CARCINOMA, GRADE I, SEE COMMENT. - LYMPHOVASCULAR INVASION IDENTIFIED - INVASIVE CARCINOMA IS BROADLY PRESENT AT POSTERIOR MARGIN - DUCTAL CARCINOMA IN SITU, GRADE I. - IN SITU CARCINOMA IS FOCALLY PRESENT AT POSTERIOR MARGIN - SEE TUMOR SYNOPTIC TEMPLATE BELOW. 4. Breast, excision, Left - BENIGN BREAST TISSUE, SEE COMMENT. - NEGATIVE FOR ATYPIA OR MALIGNANCY. - SURGICAL MARGIN, NEGATIVE FOR ATYPIA OR MALIGNANCY. 5. Lymph node, sentinel, biopsy, Left axillary - ONE LYMPH NODE, POSITIVE FOR METASTATIC MAMMARY CARCINOMA (1/1). Microscopic Comment 1. BREAST, INVASIVE TUMOR, WITH LYMPH NODE SAMPLING Specimen, including laterality: Right breast Procedure: Lumpectomy Grade: III of III Tubule formation: 3 Nuclear pleomorphism: 3 Mitotic: 3 Tumor size (gross measurement: 1.5 cm Margins: Invasive, distance to closest margin: 0.9 cm In-situ, distance to closest margin: 0.1 cm If margin positive, focally or broadly: N/A Lymphovascular invasion: Absent Ductal carcinoma in situ: Present Grade: III of III Extensive intraductal component: Absent Lobular neoplasia: Absent Tumor focality: Multifocal Treatment effect: None 2 of 5 FINAL for Amy Leonard, Amy Leonard ([id number]) Microscopic Comment(continued) If  present, treatment effect in breast tissue, lymph nodes or both: N/A Extent of tumor: Skin: N/A Nipple: N/A Skeletal muscle: N/A Lymph nodes: # examined: 11 (Part 2) Lymph nodes with metastasis: 0 Breast prognostic profile: Estrogen receptor: Repeated, previous study demonstrated 0% positivity ([id number]) Progesterone receptor: Repeated, previous study demonstrated 0% positivity ([id number]) Her 2 neu: Repeated, previous study demonstrated no amplification (1.38) ([id number]) Ki-67: Not repeated, previous study demonstrated 92% proliferation rate ([id number]) Non-neoplastic breast: Previous biopsy site, fibrocystic change, sclerosing adenosis and benign calcifications in benign ducts and lobules. TNM: pT1c, pN0, pMX Comments: There are a total of four distinct areas of abnormality identified. The first 1.5 cm corresponds to the invasive ductal carcinoma. The second 0.9 cm area consists of high grade ductal carcinoma in situ with associated fibrocystic change and fibroadenomatoid nodules. In this focus, the in situ carcinoma is 1 mm from the nearest lateral margin. The third 0.4 cm area consists of fat necrosis and fibrosis. There are no atypical or malignant findings in this area. The fourth and final 0.3 cm area demonstrate non-neoplastic findings to include fibrocystic change. There are no atypical or malignant epithelial findings in this area either. 3. BREAST, INVASIVE TUMOR, WITH LYMPH NODE SAMPLING Specimen, including laterality: Left breast Procedure: Lumpectomy Grade: I of III Tubule formation: 3 Nuclear pleomorphism: 3 Mitotic: 1 Tumor size (gross measurement): 1.0 cm Margins: Invasive, distance to closest margin: Present at posterior In-situ, distance to closest margin: Present at posterior If margin positive, focally or broadly: Broadly (invasive) and focally (in situ) Lymphovascular invasion: Present Ductal carcinoma in situ: Present Grade: I of III Extensive  intraductal component: Absent Lobular neoplasia: Absent Tumor focality: Unifocal Treatment effect: None If present, treatment effect in breast tissue, lymph nodes or both: N/A Extent of tumor: Skin: N/A Nipple: N/A Skeletal muscle: N/A Lymph nodes: # examined: 1 (Part 5) Lymph nodes with metastasis: 1 Isolated tumor cells (< 0.2 mm): 0 Micrometastasis: (> 0.2 mm and < 2.0 mm): 1 Macrometastasis: (> 2.0 mm): 0 3 of 5 FINAL for Amy Leonard, Amy Leonard ([id number]) Microscopic Comment(continued) Extracapsular extension: Present Breast prognostic profile: Estrogen receptor: Not  repeated, previous study demonstrated 100% positivity ([id number]). Progesterone receptor: Not repeated, previous study demonstrated 25% positivity ([id number]) Her 2 neu: Repeated, previous study demonstrated no amplification (1.42) ([id number]) Ki-67: Not repeated, previous study demonstrated 9% proliferation rate ([id number]) Non-neoplastic breast: Previous biopsy site, fibrocystic change and microcalcifications in benign ducts and lobules. TNM: pT1b, pN73mi, PMX Comments: The additional 1.0 cm and 1.1 cm hemorrhagic areas grossly identified consist of benign breast tissue with microcalcifications and parenchymal hemorrhage. There are no atypical or malignant findings in these areas. 4. The surgical resection margin(s) of the specimen were inked and microscopically evaluated. There is no mass grossly identified. Representative sections demonstrate predominantly benign fibrovascular and adipose tissue with focal areas of fibrocytic change and sclerosing adenosis. There are no atypical or malignant epithelial or stromal findings identified. 5. There are microscopic foci of intranodal metastatic mammary tumor deposits spanning up to 1 mm. There is focal extracapsular extension identified. (CR:kh 11-25-12) [place] RUND DO Pathologist, Electronic Signature (Case signed 11/25/2012) Specimen Gross and Clinical  Information   RADIOGRAPHIC STUDIES:  Chest 2 View  11/19/2012  *RADIOLOGY REPORT*  Clinical Data: Preop for breast cancer  CHEST - 2 VIEW  Comparison: None.  Findings: Cardiomediastinal silhouette is unremarkable.  No acute infiltrate or pleural effusion.  No pulmonary edema.  Bony thorax is unremarkable.  IMPRESSION: No active disease.   Original Report Authenticated By: Natasha Mead, M.D.    Nm Sentinel Node Inj-no Rpt (breast)  11/23/2012  CLINICAL DATA: Bilateral breast cancer   Sulfur colloid was injected intradermally by the nuclear medicine  technologist for breast cancer sentinel node localization.     Dg Chest Portable 1 View  11/23/2012  *RADIOLOGY REPORT*  Clinical Data: 53 year old female status post Port-A-Cath placement.  Breast cancer.  PORTABLE CHEST - 1 VIEW  Comparison: 11/19/2012.  Findings: Portable semi upright AP view 1441 hours.  Right chest subclavian approach Port-A-Cath placed.  Acute angulation of the catheter tubing at the level of the right axilla.  Tip at the level of the cavoatrial junction.  Interval postoperative changes to the right chest wall with subcutaneous appearing drain in place.  No pneumothorax.  No pleural effusion or pulmonary edema.  Mildly increased bibasilar opacity compatible with atelectasis.  Cardiac size and mediastinal contours are within normal limits.  Visualized tracheal air column is within normal limits.  IMPRESSION: 1.  Right subclavian approach chest Port-A-Cath in place.  Tip at the level of the cavoatrial junction.  Mildly angulated catheter tubing at the level of the axilla. 2.  Interval postoperative changes to the right chest wall. No pneumothorax. 3.  Mild dependent atelectasis.   Original Report Authenticated By: Erskine Speed, M.D.    Dg Fluoro Guide Cv Line-no Report  11/23/2012  CLINICAL DATA: bil breast cancer   FLOURO GUIDE CV LINE  Fluoroscopy was utilized by the requesting physician.  No radiographic  interpretation.     Mm Lt Plc  Breast Loc Dev   1st Lesion  Inc Mammo Guide  11/23/2012  *RADIOLOGY REPORT*  Clinical Data:  Left breast cancer  NEEDLE LOCALIZATION WITH MAMMOGRAPHIC GUIDANCE AND SPECIMEN RADIOGRAPH  Comparison:  Previous exams.  Patient presents for needle localization prior to surgery.  I met with the patient and we discussed the procedure of needle localization including benefits and alternatives. We discussed the high likelihood of a successful procedure. We discussed the risks of the procedure, including infection, bleeding, tissue injury, and further surgery. Informed, written consent was given.  Using mammographic guidance,  sterile technique, 2% lidocaine and a #9 modified Kopans needle, the mass and clip in the central aspect of the left breast was localized using a superior approach.  The films are marked for Dr. Derrell Lolling.  Specimen radiograph was performed at [facility], and confirms the mass and clip were present in the tissue sample. The clip was located at the margin of the surgical specimen.  Dr.  Derrell Lolling states she had taken more tissue at the margin to clear the border.  The specimen is marked for pathology.  IMPRESSION: Needle localization of the left breast.  No apparent complications.   Original Report Authenticated By: Baird Lyons, M.D.    Mm Rt Plc Breast Loc Dev   1st Lesion  Inc Mammo Guide  11/23/2012  *RADIOLOGY REPORT*  Clinical Data:  Known right breast cancer  NEEDLE LOCALIZATION WITH MAMMOGRAPHIC GUIDANCE AND SPECIMEN RADIOGRAPH  Comparison:  Previous exams.  Patient presents for needle localization prior to surgery.  I met with the patient and we discussed the procedure of needle localization including benefits and alternatives. We discussed the high likelihood of a successful procedure. We discussed the risks of the procedure, including infection, bleeding, tissue injury, and further surgery. Informed, written consent was given.  Using mammographic guidance, sterile technique, 2% lidocaine and a #7  and #9 modified Kopans needles, the mass and nodularity in the upper outer quadrant of the right breast was bracketedusing a lateral approach.  The films are marked for Dr. Derrell Lolling.  Specimen radiograph was performed at Day Surgery, and confirms the mass and clip are present in the tissue sample.  The specimen is marked for pathology.  IMPRESSION: Needle localization of the right breast.  No apparent complications.   Original Report Authenticated By: Baird Lyons, M.D.     ASSESSMENT: 52 year old female with  #1 bilateral breast cancers triple negative on the right side ER positive on the left side. She is status post bilateral lumpectomies. Clinically she's doing well. She will receive Adriamycin Cytoxan given dose dense x4 cycles followed by Taxol carbo x12 cycles.  Rationale risks benefits and side effects were discussed.  Patient did develop febrile neutropenia after first cycle, should it recur, a dose reduction will be considered.   #2 patient has had an echocardiogram Port-A-Cath placed and chemotherapy teaching class.  #3 Blepharitis   PLAN:   #1 Doing well.  She will proceed with chemotherapy. She will continue to moisturize her hands and feet with Aquaphor.   2. She will see [place] back next week for cycle 3 of chemo.  3. She will continue to use warm compresses/wash the lid and lashes with baby soap, and use visine.  In addition she will take long acting claritin.    All questions were answered. The patient knows to call the clinic with any problems, questions or concerns. We can certainly see the patient much sooner if necessary.  I spent 25 minutes counseling the patient face to face. The total time spent in the appointment was 30 minutes.  Cherie Ouch Lyn Hollingshead, NP [facility] Phone: [phone] 03/18/2013, 11:36 PM

## 2013-03-17 NOTE — Telephone Encounter (Signed)
Per staff message and POF I have scheduled appts.  JMW  

## 2013-03-17 NOTE — Patient Instructions (Signed)
Doing well.  Proceed with chemotherapy.  Continue to moisturize your feet.  Please call us if you have any questions or concerns.

## 2013-03-17 NOTE — Telephone Encounter (Signed)
appts made and printed. Pt is aware that her tx will be added. i emailed MW to add the tx's...td

## 2013-03-17 NOTE — Patient Instructions (Addendum)
[facility] Discharge Instructions for Patients Receiving Chemotherapy  Today you received the following chemotherapy agents: Taxol, Carboplatin.  To help prevent nausea and vomiting after your treatment, we encourage you to take your nausea medication, Zofran. Take it every eight hours as needed for nausea.   If you develop nausea and vomiting that is not controlled by your nausea medication, call the clinic.   BELOW ARE SYMPTOMS THAT SHOULD BE REPORTED IMMEDIATELY:  *FEVER GREATER THAN 100.5 F  *CHILLS WITH OR WITHOUT FEVER  NAUSEA AND VOMITING THAT IS NOT CONTROLLED WITH YOUR NAUSEA MEDICATION  *UNUSUAL SHORTNESS OF BREATH  *UNUSUAL BRUISING OR BLEEDING  TENDERNESS IN MOUTH AND THROAT WITH OR WITHOUT PRESENCE OF ULCERS  *URINARY PROBLEMS  *BOWEL PROBLEMS  UNUSUAL RASH Items with * indicate a potential emergency and should be followed up as soon as possible.  Feel free to call the clinic should you have any questions or concerns. The clinic phone number is [phone].

## 2013-03-23 ENCOUNTER — Encounter (HOSPITAL_COMMUNITY): Payer: Self-pay

## 2013-03-23 ENCOUNTER — Ambulatory Visit (HOSPITAL_COMMUNITY)
Admission: RE | Admit: 2013-03-23 | Discharge: 2013-03-23 | Disposition: A | Discharge status: Home / Self Care | Payer: BC Managed Care – PPO | Source: Ambulatory Visit | Attending: Oncology | Admitting: Oncology

## 2013-03-23 ENCOUNTER — Ambulatory Visit (HOSPITAL_BASED_OUTPATIENT_CLINIC_OR_DEPARTMENT_OTHER)
Admission: RE | Admit: 2013-03-23 | Discharge: 2013-03-23 | Disposition: A | Discharge status: Home / Self Care | Payer: BC Managed Care – PPO | Source: Ambulatory Visit | Attending: Internal Medicine | Admitting: Internal Medicine

## 2013-03-23 VITALS — BP 110/80 | HR 78 | Ht 63.0 in | Wt 188.8 lb

## 2013-03-23 DIAGNOSIS — D709 Neutropenia, unspecified: Secondary | ICD-10-CM | POA: Insufficient documentation

## 2013-03-23 DIAGNOSIS — I10 Essential (primary) hypertension: Secondary | ICD-10-CM | POA: Insufficient documentation

## 2013-03-23 DIAGNOSIS — M549 Dorsalgia, unspecified: Secondary | ICD-10-CM | POA: Insufficient documentation

## 2013-03-23 DIAGNOSIS — C50919 Malignant neoplasm of unspecified site of unspecified female breast: Secondary | ICD-10-CM

## 2013-03-23 DIAGNOSIS — F411 Generalized anxiety disorder: Secondary | ICD-10-CM | POA: Insufficient documentation

## 2013-03-23 DIAGNOSIS — Z8673 Personal history of transient ischemic attack (TIA), and cerebral infarction without residual deficits: Secondary | ICD-10-CM | POA: Insufficient documentation

## 2013-03-23 DIAGNOSIS — F329 Major depressive disorder, single episode, unspecified: Secondary | ICD-10-CM | POA: Insufficient documentation

## 2013-03-23 DIAGNOSIS — Z09 Encounter for follow-up examination after completed treatment for conditions other than malignant neoplasm: Secondary | ICD-10-CM

## 2013-03-23 DIAGNOSIS — I059 Rheumatic mitral valve disease, unspecified: Secondary | ICD-10-CM | POA: Insufficient documentation

## 2013-03-23 DIAGNOSIS — R5081 Fever presenting with conditions classified elsewhere: Secondary | ICD-10-CM | POA: Insufficient documentation

## 2013-03-23 DIAGNOSIS — C50912 Malignant neoplasm of unspecified site of left female breast: Secondary | ICD-10-CM

## 2013-03-23 DIAGNOSIS — F17200 Nicotine dependence, unspecified, uncomplicated: Secondary | ICD-10-CM | POA: Insufficient documentation

## 2013-03-23 DIAGNOSIS — F3289 Other specified depressive episodes: Secondary | ICD-10-CM | POA: Insufficient documentation

## 2013-03-23 DIAGNOSIS — D649 Anemia, unspecified: Secondary | ICD-10-CM | POA: Insufficient documentation

## 2013-03-23 DIAGNOSIS — D696 Thrombocytopenia, unspecified: Secondary | ICD-10-CM | POA: Insufficient documentation

## 2013-03-23 DIAGNOSIS — E785 Hyperlipidemia, unspecified: Secondary | ICD-10-CM | POA: Insufficient documentation

## 2013-03-23 NOTE — Progress Notes (Signed)
  Echocardiogram 2D Echocardiogram has been performed.  Jorje Guild 03/23/2013, 2:34 PM

## 2013-03-23 NOTE — Patient Instructions (Addendum)
The current medical regimen is effective;  continue present plan and medications.  Your physician has requested that you have an echocardiogram. Echocardiography is a painless test that uses sound waves to create images of your heart. It provides your doctor with information about the size and shape of your heart and how well your heart's chambers and valves are working. This procedure takes approximately one hour. There are no restrictions for this procedure.  Follow up with Dr Gala Romney in 3 months.

## 2013-03-23 NOTE — Progress Notes (Signed)
Patient ID: Amy Leonard, female   DOB: [birth year], 52 y.o.   MRN: [id number]  General Surgeon: Dr Amy Leonard Oncologist: Dr Amy Leonard PCP: Dr Amy Leonard  HPI: Amy Leonard is a 52 year old with a history of bilateral invasive ductal carcinoma breast cancer( Left ER positive PR positive HER-2/neu negative, R Triple-negative breast cancer), depression, HTN, hyperlipidemia and current tobacco use.  She will receive adjuvant chemotherapy with Adriamycin and Cytoxan given dose dense 4 cycles followed by weekly Taxol and carboplatinum for a total of 12 weeks starting Dec 30, 2012. Completed Adriamycin in June 2014. She continues on carb and taxol for 12 cycles - has completed 2/12.  She will also need need radiation therapy adjuvantly.   ECHO 11/26/11 EF 55-60% lateral s' 11.5 poor windows which will be difficult to follow subsequent lateral S'.  ECHO 01/20/13 EF 55-60% lateral S' 11.6 ECHO 03/23/13 EF 55-60% laterals s' 12.1 global strain -19.9  She returns for follow up.  Complains of fatigue. Denies SOB/PND/Orthopnea. Still smoking almost 1 PPD.     Past Medical History  Diagnosis Date  . Elevated WBC count     nl diff  . Chronic back pain     spinal stenosis  . Tobacco abuse   . Anxiety and depression   . HSV infection     recurrent (side and buttox)  . Migraine   . Glaucoma     ?  . Carpal tunnel syndrome   . HLD (hyperlipidemia)   . Folliculitis 05/05/2011  . Anxiety   . Depression   . 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 GERD (gastroesophageal reflux disease)   . Hypertension   . Breast cancer   . Wears dentures     top    Current Outpatient Prescriptions  Medication Sig Dispense Refill  . Acetaminophen (TYLENOL ARTHRITIS PAIN PO) Take 2 tablets by mouth as needed (pain).       Marland Kitchen acyclovir (ZOVIRAX) 5 % ointment Apply 1 application topically as needed (flare-up).       Marland Kitchen aspirin 81 MG chewable tablet Chew 81 mg by mouth daily.      . ATIVAN 0.5 MG tablet Take 1 tablet (0.5 mg total)  by mouth every 6 (six) hours as needed (Nausea or vomiting).  45 tablet  0  . atorvastatin (LIPITOR) 10 MG tablet Take 1 tablet (10 mg total) by mouth daily.  90 tablet  1  . calcium carbonate (TUMS - DOSED IN MG ELEMENTAL CALCIUM) 500 MG chewable tablet Chew by mouth as directed.        . COMPRO 25 MG suppository       . cyclobenzaprine (FLEXERIL) 10 MG tablet Take 10 mg by mouth as needed (muscle spasm).       Marland Kitchen dexamethasone (DECADRON) 4 MG tablet Take 1 tablet (4 mg total) by mouth as directed. 2 tablets PO the day after chemo then 2 tablets PO BID x 2 days.  30 tablet  4  . lidocaine-prilocaine (EMLA) cream Apply topically as needed.  30 g  8  . losartan (COZAAR) 50 MG tablet Take 1 tablet (50 mg total) by mouth daily.  90 tablet  2  . nystatin (MYCOSTATIN) 100000 UNIT/ML suspension Take 5 mLs (500,000 Units total) by mouth 2 (two) times daily.  240 mL  3  . omeprazole (PRILOSEC) 40 MG capsule Take 1 capsule (40 mg total) by mouth 2 (two) times daily.  180 capsule  3  . ondansetron (ZOFRAN) 8 MG tablet       .  prochlorperazine (COMPAZINE) 10 MG tablet       . prochlorperazine (COMPAZINE) 25 MG suppository Place 1 suppository (25 mg total) rectally every 12 (twelve) hours as needed for nausea.  12 suppository  3  . simethicone (MYLICON) 80 MG chewable tablet Chew 1 tablet (80 mg total) by mouth every 8 (eight) hours as needed for flatulence.  60 tablet  3  . fluconazole (DIFLUCAN) 100 MG tablet Take 2 tablets (200 mg total) by mouth daily.  14 tablet  0   No current facility-administered medications for this encounter.     Allergies  Allergen Reactions  . Bupropion Hcl Nausea Only and Other (See Comments)     GI, sleepy  . Chlorhexidine Gluconate Itching and Rash  . Hydrocod Polst-Cpm Polst Er Nausea And Vomiting    vomiting  . Lisinopril Other (See Comments)    Leg cramps    . Naproxen Sodium Other (See Comments)    does not tolerate    History   Social History  . Marital  Status: Divorced    Spouse Name: N/A    Number of Children: N/A  . Years of Education: N/A   Occupational History  . Full time and PT job    Social History Main Topics  . Smoking status: Current Every Day Smoker -- 0.50 packs/day for 33 years    Types: Cigarettes  . Smokeless tobacco: Never Used     Comment: 1 1/2 ppd -form given 10-07-12  . Alcohol Use: Yes     Comment: 4-5 beers fri and sat night-does this rarely  . Drug Use: No  . Sexually Active: Not Currently   Other Topics Concern  . Not on file   Social History Narrative   Separated; full time and part time job; no regular exercise.              Family History  Problem Relation Age of Onset  . Stroke Maternal Grandmother   . Diabetes Maternal Grandmother   . Diabetes Mother   . Hypertension Mother   . Leukemia Mother   . Obesity Mother   . Depression Sister   . Bipolar disorder Son     Bipolar / oppositional defiant  . Alcohol abuse Sister   . Drug abuse Sister   . Alcohol abuse Sister   . Drug abuse Sister   . Colon cancer Neg Hx   . Alcohol abuse Brother   . Drug abuse Brother     PHYSICAL EXAM: Filed Vitals:   03/23/13 1446  BP: 110/80  Pulse: 78   General:  Well appearing. No respiratory difficulty Daughter present HEENT: normal x for alopecia Neck: supple. no JVD. Carotids 2+ bilat; no bruits. No lymphadenopathy or thryomegaly appreciated. Cor: PMI nondisplaced. Regular rate & rhythm. No rubs, gallops or murmurs. Lungs: clear Abdomen: soft, nontender, nondistended. No hepatosplenomegaly. No bruits or masses. Good bowel sounds. Extremities: no cyanosis, clubbing, rash, edema. PTs 1+ bilaterally Neuro: alert & oriented x 3, cranial nerves grossly intact. moves all 4 extremities w/o difficulty. Affect pleasant.  No results found for this or any previous visit (from the past 24 hour(s)). No results found.  ASSESSMENT & PLAN  1. Breast cancer      --s/p adriamycin therapy 2. HL 3. Tobacco  use, ongoing  I reviewed echos personally. EF and Doppler parameters stable. No HF on exam. Will recommend one more echo after completion of chemotherapy in 3 months. Discussed need to limit smoking as much as possible.  Richey Doolittle,MD 3:15 PM

## 2013-03-23 NOTE — Addendum Note (Signed)
Encounter addended by: Rocco Serene, RN on: 03/23/2013  3:23 PM<BR>     Documentation filed: Follow-up Section, Patient Instructions Section, Orders

## 2013-03-24 ENCOUNTER — Ambulatory Visit (HOSPITAL_BASED_OUTPATIENT_CLINIC_OR_DEPARTMENT_OTHER): Payer: BC Managed Care – PPO

## 2013-03-24 ENCOUNTER — Encounter: Payer: Self-pay | Admitting: Adult Health

## 2013-03-24 ENCOUNTER — Other Ambulatory Visit (HOSPITAL_BASED_OUTPATIENT_CLINIC_OR_DEPARTMENT_OTHER): Payer: BC Managed Care – PPO | Admitting: Lab

## 2013-03-24 ENCOUNTER — Ambulatory Visit (HOSPITAL_BASED_OUTPATIENT_CLINIC_OR_DEPARTMENT_OTHER): Payer: BC Managed Care – PPO | Admitting: Adult Health

## 2013-03-24 VITALS — BP 127/78 | HR 94 | Temp 98.3°F | Resp 20 | Ht 63.0 in | Wt 189.8 lb

## 2013-03-24 DIAGNOSIS — C50919 Malignant neoplasm of unspecified site of unspecified female breast: Secondary | ICD-10-CM

## 2013-03-24 DIAGNOSIS — C50411 Malignant neoplasm of upper-outer quadrant of right female breast: Secondary | ICD-10-CM

## 2013-03-24 DIAGNOSIS — C50912 Malignant neoplasm of unspecified site of left female breast: Secondary | ICD-10-CM

## 2013-03-24 DIAGNOSIS — Z5111 Encounter for antineoplastic chemotherapy: Secondary | ICD-10-CM

## 2013-03-24 LAB — COMPREHENSIVE METABOLIC PANEL (CC13)
ALT: 20 U/L (ref 0–55)
Albumin: 3.2 g/dL — ABNORMAL LOW (ref 3.5–5.0)
Alkaline Phosphatase: 55 U/L (ref 40–150)
CO2: 24 mEq/L (ref 22–29)
Glucose: 104 mg/dl (ref 70–140)
Potassium: 3.9 mEq/L (ref 3.5–5.1)
Sodium: 138 mEq/L (ref 136–145)
Total Protein: 5.7 g/dL — ABNORMAL LOW (ref 6.4–8.3)

## 2013-03-24 LAB — CBC WITH DIFFERENTIAL/PLATELET
Eosinophils Absolute: 0.1 10*3/uL (ref 0.0–0.5)
MONO#: 0.5 10*3/uL (ref 0.1–0.9)
MONO%: 7 % (ref 0.0–14.0)
NEUT#: 5.3 10*3/uL (ref 1.5–6.5)
RBC: 3.46 10*6/uL — ABNORMAL LOW (ref 3.70–5.45)
RDW: 17.6 % — ABNORMAL HIGH (ref 11.2–14.5)
WBC: 7.2 10*3/uL (ref 3.9–10.3)

## 2013-03-24 MED ORDER — DIPHENHYDRAMINE HCL 50 MG/ML IJ SOLN
12.5000 mg | Freq: Once | INTRAMUSCULAR | Status: AC
  Administered 2013-03-24: 12.5 mg via INTRAVENOUS

## 2013-03-24 MED ORDER — DEXAMETHASONE SODIUM PHOSPHATE 20 MG/5ML IJ SOLN
20.0000 mg | Freq: Once | INTRAMUSCULAR | Status: AC
  Administered 2013-03-24: 20 mg via INTRAVENOUS

## 2013-03-24 MED ORDER — ONDANSETRON 16 MG/50ML IVPB (CHCC)
16.0000 mg | Freq: Once | INTRAVENOUS | Status: AC
  Administered 2013-03-24: 16 mg via INTRAVENOUS

## 2013-03-24 MED ORDER — SODIUM CHLORIDE 0.9 % IV SOLN
Freq: Once | INTRAVENOUS | Status: AC
  Administered 2013-03-24: 13:00:00 via INTRAVENOUS

## 2013-03-24 MED ORDER — PACLITAXEL CHEMO INJECTION 300 MG/50ML
80.0000 mg/m2 | Freq: Once | INTRAVENOUS | Status: AC
  Administered 2013-03-24: 156 mg via INTRAVENOUS
  Filled 2013-03-24: qty 26

## 2013-03-24 MED ORDER — ACETAMINOPHEN 325 MG PO TABS
650.0000 mg | ORAL_TABLET | Freq: Once | ORAL | Status: AC
  Administered 2013-03-24: 650 mg via ORAL

## 2013-03-24 MED ORDER — SODIUM CHLORIDE 0.9 % IV SOLN
300.0000 mg | Freq: Once | INTRAVENOUS | Status: AC
  Administered 2013-03-24: 300 mg via INTRAVENOUS
  Filled 2013-03-24: qty 30

## 2013-03-24 MED ORDER — SODIUM CHLORIDE 0.9 % IJ SOLN
10.0000 mL | INTRAMUSCULAR | Status: DC | PRN
  Administered 2013-03-24: 10 mL
  Filled 2013-03-24: qty 10

## 2013-03-24 MED ORDER — FAMOTIDINE IN NACL 20-0.9 MG/50ML-% IV SOLN
20.0000 mg | Freq: Once | INTRAVENOUS | Status: AC
  Administered 2013-03-24: 20 mg via INTRAVENOUS

## 2013-03-24 MED ORDER — HEPARIN SOD (PORK) LOCK FLUSH 100 UNIT/ML IV SOLN
500.0000 [IU] | Freq: Once | INTRAVENOUS | Status: AC | PRN
  Administered 2013-03-24: 500 [IU]
  Filled 2013-03-24: qty 5

## 2013-03-24 NOTE — Patient Instructions (Addendum)
Doing well.  Proceed with chemotherapy.  Please call us if you have any questions or concerns.    

## 2013-03-24 NOTE — Patient Instructions (Addendum)
[facility] Discharge Instructions for Patients Receiving Chemotherapy  Today you received the following chemotherapy agents :  Taxol, Carboplatin.  To help prevent nausea and vomiting after your treatment, we encourage you to take your nausea medication as instructed by your physician.   If you develop nausea and vomiting that is not controlled by your nausea medication, call the clinic.   BELOW ARE SYMPTOMS THAT SHOULD BE REPORTED IMMEDIATELY:  *FEVER GREATER THAN 100.5 F  *CHILLS WITH OR WITHOUT FEVER  NAUSEA AND VOMITING THAT IS NOT CONTROLLED WITH YOUR NAUSEA MEDICATION  *UNUSUAL SHORTNESS OF BREATH  *UNUSUAL BRUISING OR BLEEDING  TENDERNESS IN MOUTH AND THROAT WITH OR WITHOUT PRESENCE OF ULCERS  *URINARY PROBLEMS  *BOWEL PROBLEMS  UNUSUAL RASH Items with * indicate a potential emergency and should be followed up as soon as possible.  Feel free to call the clinic you have any questions or concerns. The clinic phone number is [phone].    

## 2013-03-24 NOTE — Progress Notes (Signed)
OFFICE PROGRESS NOTE  CC  [name], MD [address] [place] [address] [place] [address] Dr. Claud Kelp  Dr. Antony Blackbird  DIAGNOSIS: 52 year old female with new diagnosis of bilateral breast cancers.   STAGE:  Cancer of upper-outer quadrant of female breast  Primary site: Breast (Bilateral)  Staging method: AJCC 7th Edition  Clinical: Stage IA (T1c, N0, cM0)  Summary: Stage IA (T1c, N0, cM0)   PRIOR THERAPY: #1Patient recently underwent screening mammograms and she was found to have bilateral breast abnormalities.diagnostic mammogram showed in the right breast an ill-defined 2 cm mass with a few associated well defined microcalcifications over the upper outer quadrant. Spot compression images of the left breast demonstrated an ill defined spiculated 8 mm mass centrally. Ultrasound showed irregular bordered heterogeneous hypoechoic solid mass at the 10:00 position of the right breast 6 cm from the nipple measuring 1.1 x 1.3 x 1.4 cm. Also in this location was an adjacent smaller irregular hypoechoic mass measuring 3 x 5 x 6 mm. The smaller mass was 1 cm from the large more irregular mass. Ultrasound of the axilla demonstrated single lymph node with thickened cortex. Ultrasound of the left breast demonstrated an ill-defined hypoechoic mass with distal acoustic shadowing 7 to 8:00 position 3 cm from the nipple located deep and measuring 5 x 8 x 8 mm ultrasound of the left axilla showed normal appearing lymph nodes.   #2Patient went on to have needle core biopsies performed of both of the masses. 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 The tumor was ER positive PR positive with a Ki-67 of 9% the second tumor was ER negative PR negative HER-2/neu negative with Ki-6792% and elevated.  #3 Patient had MRIs of the breasts performed. There was  noted to be 1.7 cm irregular enhancing mass located within the upper-outer quadrant of the right breast with 3 adjacent worrisome enhancing satellite nodules in aggregate the nodules and mass measures 3.8 cm. In the left breast 8 mm irregular enhancing mass located within the central left breast was noted. No evidence of axillary or internal mammary adenopathy.  #4patient is status post bilateral lumpectomies. The right breast revealed a 1.5 cm grade 3 invasive ductal carcinoma ER/PR negative HER-2/neu negative Ki-67 92%. Sentinel node was negative. Left breast showed 1.0 cm grade 3 invasive ductal carcinoma ER/PR positive HER-2/neu negative sentinel node was negative.  #5 patient is to begin adjuvant chemotherapy consisting of Adriamycin Cytoxan every 2 weeks for a total of 4 cycles 12/30/12. This would then be followed by Taxol and carboplatinum weekly for 12 weeks. Patient will need adjuvant radiation therapy. Because one of the tumor is ER positive she will also receive antiestrogen therapy.  CURRENT THERAPY: Taxol Carbo week 3  INTERVAL HISTORY: Amy Leonard 52 y.o. female returns for followup visit prior to her third week of Taxol/Carbo.  She is doing well today.  Her feet and eyes are improving every week.  She is doing well today and denies fevers, chills, nausea, vomiting, constipation, diarrhea, numbness or any further concerns.  She had an appt with Dr. Gala Romney yesterday that was good.    MEDICAL HISTORY: Past Medical History  Diagnosis Date  . Elevated WBC count     nl diff  . Chronic back pain     spinal stenosis  . Tobacco abuse   . Anxiety and  depression   . HSV infection     recurrent (side and buttox)  . Migraine   . Glaucoma     ?  . Carpal tunnel syndrome   . HLD (hyperlipidemia)   . Folliculitis 05/05/2011  . Anxiety   . Depression   . 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 GERD (gastroesophageal reflux disease)   . Hypertension   . Breast cancer   . Wears  dentures     top    ALLERGIES:  is allergic to bupropion hcl; chlorhexidine gluconate; hydrocod polst-cpm polst er; lisinopril; and naproxen sodium.  MEDICATIONS:  Current Outpatient Prescriptions  Medication Sig Dispense Refill  . Acetaminophen (TYLENOL ARTHRITIS PAIN PO) Take 2 tablets by mouth as needed (pain).       Marland Kitchen acyclovir (ZOVIRAX) 5 % ointment Apply 1 application topically as needed (flare-up).       Marland Kitchen aspirin 81 MG chewable tablet Chew 81 mg by mouth daily.      . ATIVAN 0.5 MG tablet Take 1 tablet (0.5 mg total) by mouth every 6 (six) hours as needed (Nausea or vomiting).  45 tablet  0  . atorvastatin (LIPITOR) 10 MG tablet Take 1 tablet (10 mg total) by mouth daily.  90 tablet  1  . calcium carbonate (TUMS - DOSED IN MG ELEMENTAL CALCIUM) 500 MG chewable tablet Chew by mouth as directed.        . COMPRO 25 MG suppository       . cyclobenzaprine (FLEXERIL) 10 MG tablet Take 10 mg by mouth as needed (muscle spasm).       Marland Kitchen dexamethasone (DECADRON) 4 MG tablet Take 1 tablet (4 mg total) by mouth as directed. 2 tablets PO the day after chemo then 2 tablets PO BID x 2 days.  30 tablet  4  . fluconazole (DIFLUCAN) 100 MG tablet Take 2 tablets (200 mg total) by mouth daily.  14 tablet  0  . lidocaine-prilocaine (EMLA) cream Apply topically as needed.  30 g  8  . losartan (COZAAR) 50 MG tablet Take 1 tablet (50 mg total) by mouth daily.  90 tablet  2  . nystatin (MYCOSTATIN) 100000 UNIT/ML suspension Take 5 mLs (500,000 Units total) by mouth 2 (two) times daily.  240 mL  3  . omeprazole (PRILOSEC) 40 MG capsule Take 1 capsule (40 mg total) by mouth 2 (two) times daily.  180 capsule  3  . ondansetron (ZOFRAN) 8 MG tablet       . prochlorperazine (COMPAZINE) 10 MG tablet       . prochlorperazine (COMPAZINE) 25 MG suppository Place 1 suppository (25 mg total) rectally every 12 (twelve) hours as needed for nausea.  12 suppository  3  . simethicone (MYLICON) 80 MG chewable tablet Chew 1  tablet (80 mg total) by mouth every 8 (eight) hours as needed for flatulence.  60 tablet  3   No current facility-administered medications for this visit.    SURGICAL HISTORY:  Past Surgical History  Procedure Laterality Date  . Btl    . Epidural steroid injection  2008  . Colonoscopy    . Upper gi endoscopy    . Partial mastectomy with needle localization and axillary sentinel lymph node bx Bilateral 11/23/2012    Procedure: PARTIAL MASTECTOMY WITH NEEDLE LOCALIZATION AND AXILLARY SENTINEL LYMPH NODE BX;  Surgeon: Ernestene Mention, MD;  Location: [facility];  Service: General;  Laterality: Bilateral;  . Portacath placement Right 11/23/2012  Procedure: INSERTION PORT-A-CATH;  Surgeon: Ernestene Mention, MD;  Location: [facility];  Service: General;  Laterality: Right;    REVIEW OF SYSTEMS:  Pertinent items are noted in HPI.   PHYSICAL EXAMINATION: Blood pressure 127/78, pulse 94, temperature 98.3 F (36.8 C), temperature source Oral, resp. rate 20, height 5\' 3"  (1.6 m), weight 189 lb 12.8 oz (86.093 kg). Body mass index is 33.63 kg/(m^2). General: Patient is a well appearing female in no acute distress HEENT: PERRLA, no scleral injection, crusting, or puffiness of eyes noted.  sclerae anicteric no conjunctival pallor, MMM Neck: supple, no palpable adenopathy Lungs: clear to auscultation bilaterally, no wheezes, rhonchi, or rales Cardiovascular: regular rhythm, S1, S2, no murmurs, rubs or gallops Abdomen: Soft, non-tender, non-distended, normoactive bowel sounds, no HSM Extremities: warm and well perfused, no clubbing, cyanosis, or edema, entire soles of feet are not erythematous no skin breakdown noted, slight peeling on sole of left foot, well moisturizied.  Her fingertips have no noted abnormality Skin: No rashes or lesions Neuro: Non-focal Breasts: Bilateral lumpectomy sites without nodularity, no masses or skin changes in either breast. ECOG  PERFORMANCE STATUS: 0 - Asymptomatic  LABORATORY DATA: Lab Results  Component Value Date   WBC 7.2 03/24/2013   HGB 11.6 03/24/2013   HCT 33.9* 03/24/2013   MCV 98.0 03/24/2013   PLT 192 03/24/2013      Chemistry      Component Value Date/Time   NA 139 03/17/2013 1112   NA 135 01/08/2013 0555   K 3.8 03/17/2013 1112   K 4.0 01/08/2013 0555   CL 109* 02/17/2013 1120   CL 100 01/08/2013 0555   CO2 26 03/17/2013 1112   CO2 28 01/08/2013 0555   BUN 13.2 03/17/2013 1112   BUN 10 01/08/2013 0555   CREATININE 0.6 03/17/2013 1112   CREATININE 0.62 01/08/2013 0555      Component Value Date/Time   CALCIUM 8.8 03/17/2013 1112   CALCIUM 9.1 01/08/2013 0555   ALKPHOS 57 03/17/2013 1112   ALKPHOS 67 01/05/2013 2245   AST 16 03/17/2013 1112   AST 10 01/05/2013 2245   ALT 21 03/17/2013 1112   ALT 15 01/05/2013 2245   BILITOT 0.30 03/17/2013 1112   BILITOT 0.5 01/05/2013 2245    ADDITIONAL INFORMATION: 1. CHROMOGENIC IN-SITU HYBRIDIZATION Results: HER-2/NEU BY CISH - NO AMPLIFICATION OF HER-2 DETECTED. RESULT RATIO OF HER2: CEP 17 SIGNALS 1.00 AVERAGE HER2 COPY NUMBER PER CELL 1.55 REFERENCE RANGE NEGATIVE HER2/Chr17 Ratio <2.0 and Average HER2 copy number <4.0 EQUIVOCAL HER2/Chr17 Ratio <2.0 and Average HER2 copy number 4.0 and <6.0 POSITIVE HER2/Chr17 Ratio >=2.0 and/or Average HER2 copy number >=6.0 Pecola Leisure MD Pathologist, Electronic Signature ( Signed 12/01/2012) 3. CHROMOGENIC IN-SITU HYBRIDIZATION Results: HER-2/NEU BY CISH - NO AMPLIFICATION OF HER-2 DETECTED. RESULT RATIO OF HER2: CEP 17 SIGNALS 1.22 AVERAGE HER2 COPY NUMBER PER CELL 1.40 REFERENCE RANGE NEGATIVE HER2/Chr17 Ratio <2.0 and Average HER2 copy number <4.0 EQUIVOCAL HER2/Chr17 Ratio <2.0 and Average HER2 copy number 4.0 and <6.0 POSITIVE HER2/Chr17 Ratio >=2.0 and/or Average HER2 copy number >=6.0 1 of 5 FINAL for Amy Leonard, Amy Leonard ([id number]) ADDITIONAL INFORMATION:(continued) Pecola Leisure MD Pathologist, Electronic  Signature ( Signed 12/01/2012) FINAL DIAGNOSIS Diagnosis 1. Breast, lumpectomy, Right - INVASIVE DUCTAL CARCINOMA, GRADE III, SEE COMMENT. - INVASIVE CARCINOMA IS 0.9 CM FROM NEAREST MARGIN (INFERIOR). - NO LYMPHOVASCULAR INVASION IDENTIFIED. - HIGH GRADE DUCTAL CARCINOMA IN SITU - IN SITU CARCINOMA IS 1 MM FROM THE NEAREST LATERAL MARGIN - SEE  TUMOR SYNOPTIC TEMPLATE BELOW. 2. Lymph nodes, regional resection, Right axilla - ELEVEN LYMPH NODES, NEGATIVE FOR TUMOR (0/11). 3. Breast, lumpectomy, Left - INVASIVE DUCTAL CARCINOMA, GRADE I, SEE COMMENT. - LYMPHOVASCULAR INVASION IDENTIFIED - INVASIVE CARCINOMA IS BROADLY PRESENT AT POSTERIOR MARGIN - DUCTAL CARCINOMA IN SITU, GRADE I. - IN SITU CARCINOMA IS FOCALLY PRESENT AT POSTERIOR MARGIN - SEE TUMOR SYNOPTIC TEMPLATE BELOW. 4. Breast, excision, Left - BENIGN BREAST TISSUE, SEE COMMENT. - NEGATIVE FOR ATYPIA OR MALIGNANCY. - SURGICAL MARGIN, NEGATIVE FOR ATYPIA OR MALIGNANCY. 5. Lymph node, sentinel, biopsy, Left axillary - ONE LYMPH NODE, POSITIVE FOR METASTATIC MAMMARY CARCINOMA (1/1). Microscopic Comment 1. BREAST, INVASIVE TUMOR, WITH LYMPH NODE SAMPLING Specimen, including laterality: Right breast Procedure: Lumpectomy Grade: III of III Tubule formation: 3 Nuclear pleomorphism: 3 Mitotic: 3 Tumor size (gross measurement: 1.5 cm Margins: Invasive, distance to closest margin: 0.9 cm In-situ, distance to closest margin: 0.1 cm If margin positive, focally or broadly: N/A Lymphovascular invasion: Absent Ductal carcinoma in situ: Present Grade: III of III Extensive intraductal component: Absent Lobular neoplasia: Absent Tumor focality: Multifocal Treatment effect: None 2 of 5 FINAL for Amy Leonard, Amy Leonard ([id number]) Microscopic Comment(continued) If present, treatment effect in breast tissue, lymph nodes or both: N/A Extent of tumor: Skin: N/A Nipple: N/A Skeletal muscle: N/A Lymph nodes: # examined: 11 (Part  2) Lymph nodes with metastasis: 0 Breast prognostic profile: Estrogen receptor: Repeated, previous study demonstrated 0% positivity ([id number]) Progesterone receptor: Repeated, previous study demonstrated 0% positivity ([id number]) Her 2 neu: Repeated, previous study demonstrated no amplification (1.38) ([id number]) Ki-67: Not repeated, previous study demonstrated 92% proliferation rate ([id number]) Non-neoplastic breast: Previous biopsy site, fibrocystic change, sclerosing adenosis and benign calcifications in benign ducts and lobules. TNM: pT1c, pN0, pMX Comments: There are a total of four distinct areas of abnormality identified. The first 1.5 cm corresponds to the invasive ductal carcinoma. The second 0.9 cm area consists of high grade ductal carcinoma in situ with associated fibrocystic change and fibroadenomatoid nodules. In this focus, the in situ carcinoma is 1 mm from the nearest lateral margin. The third 0.4 cm area consists of fat necrosis and fibrosis. There are no atypical or malignant findings in this area. The fourth and final 0.3 cm area demonstrate non-neoplastic findings to include fibrocystic change. There are no atypical or malignant epithelial findings in this area either. 3. BREAST, INVASIVE TUMOR, WITH LYMPH NODE SAMPLING Specimen, including laterality: Left breast Procedure: Lumpectomy Grade: I of III Tubule formation: 3 Nuclear pleomorphism: 3 Mitotic: 1 Tumor size (gross measurement): 1.0 cm Margins: Invasive, distance to closest margin: Present at posterior In-situ, distance to closest margin: Present at posterior If margin positive, focally or broadly: Broadly (invasive) and focally (in situ) Lymphovascular invasion: Present Ductal carcinoma in situ: Present Grade: I of III Extensive intraductal component: Absent Lobular neoplasia: Absent Tumor focality: Unifocal Treatment effect: None If present, treatment effect in breast tissue, lymph nodes or both:  N/A Extent of tumor: Skin: N/A Nipple: N/A Skeletal muscle: N/A Lymph nodes: # examined: 1 (Part 5) Lymph nodes with metastasis: 1 Isolated tumor cells (< 0.2 mm): 0 Micrometastasis: (> 0.2 mm and < 2.0 mm): 1 Macrometastasis: (> 2.0 mm): 0 3 of 5 FINAL for Amy Leonard, Amy Leonard ([id number]) Microscopic Comment(continued) Extracapsular extension: Present Breast prognostic profile: Estrogen receptor: Not repeated, previous study demonstrated 100% positivity ([id number]). Progesterone receptor: Not repeated, previous study demonstrated 25% positivity ([id number]) Her 2 neu: Repeated, previous study demonstrated no amplification (1.42) ([id number]) Ki-67: Not repeated, previous study demonstrated  9% proliferation rate ([id number]) Non-neoplastic breast: Previous biopsy site, fibrocystic change and microcalcifications in benign ducts and lobules. TNM: pT1b, pN47mi, PMX Comments: The additional 1.0 cm and 1.1 cm hemorrhagic areas grossly identified consist of benign breast tissue with microcalcifications and parenchymal hemorrhage. There are no atypical or malignant findings in these areas. 4. The surgical resection margin(s) of the specimen were inked and microscopically evaluated. There is no mass grossly identified. Representative sections demonstrate predominantly benign fibrovascular and adipose tissue with focal areas of fibrocytic change and sclerosing adenosis. There are no atypical or malignant epithelial or stromal findings identified. 5. There are microscopic foci of intranodal metastatic mammary tumor deposits spanning up to 1 mm. There is focal extracapsular extension identified. (CR:kh 11-25-12) [place] RUND DO Pathologist, Electronic Signature (Case signed 11/25/2012) Specimen Gross and Clinical Information   RADIOGRAPHIC STUDIES:  Chest 2 View  11/19/2012  *RADIOLOGY REPORT*  Clinical Data: Preop for breast cancer  CHEST - 2 VIEW  Comparison: None.  Findings:  Cardiomediastinal silhouette is unremarkable.  No acute infiltrate or pleural effusion.  No pulmonary edema.  Bony thorax is unremarkable.  IMPRESSION: No active disease.   Original Report Authenticated By: Natasha Mead, M.D.    Nm Sentinel Node Inj-no Rpt (breast)  11/23/2012  CLINICAL DATA: Bilateral breast cancer   Sulfur colloid was injected intradermally by the nuclear medicine  technologist for breast cancer sentinel node localization.     Dg Chest Portable 1 View  11/23/2012  *RADIOLOGY REPORT*  Clinical Data: 52 year old female status post Port-A-Cath placement.  Breast cancer.  PORTABLE CHEST - 1 VIEW  Comparison: 11/19/2012.  Findings: Portable semi upright AP view 1441 hours.  Right chest subclavian approach Port-A-Cath placed.  Acute angulation of the catheter tubing at the level of the right axilla.  Tip at the level of the cavoatrial junction.  Interval postoperative changes to the right chest wall with subcutaneous appearing drain in place.  No pneumothorax.  No pleural effusion or pulmonary edema.  Mildly increased bibasilar opacity compatible with atelectasis.  Cardiac size and mediastinal contours are within normal limits.  Visualized tracheal air column is within normal limits.  IMPRESSION: 1.  Right subclavian approach chest Port-A-Cath in place.  Tip at the level of the cavoatrial junction.  Mildly angulated catheter tubing at the level of the axilla. 2.  Interval postoperative changes to the right chest wall. No pneumothorax. 3.  Mild dependent atelectasis.   Original Report Authenticated By: Erskine Speed, M.D.    Dg Fluoro Guide Cv Line-no Report  11/23/2012  CLINICAL DATA: bil breast cancer   FLOURO GUIDE CV LINE  Fluoroscopy was utilized by the requesting physician.  No radiographic  interpretation.     Mm Lt Plc Breast Loc Dev   1st Lesion  Inc Mammo Guide  11/23/2012  *RADIOLOGY REPORT*  Clinical Data:  Left breast cancer  NEEDLE LOCALIZATION WITH MAMMOGRAPHIC GUIDANCE AND SPECIMEN  RADIOGRAPH  Comparison:  Previous exams.  Patient presents for needle localization prior to surgery.  I met with the patient and we discussed the procedure of needle localization including benefits and alternatives. We discussed the high likelihood of a successful procedure. We discussed the risks of the procedure, including infection, bleeding, tissue injury, and further surgery. Informed, written consent was given.  Using mammographic guidance, sterile technique, 2% lidocaine and a #9 modified Kopans needle, the mass and clip in the central aspect of the left breast was localized using a superior approach.  The films are marked for  Dr. Derrell Lolling.  Specimen radiograph was performed at [facility], and confirms the mass and clip were present in the tissue sample. The clip was located at the margin of the surgical specimen.  Dr.  Derrell Lolling states she had taken more tissue at the margin to clear the border.  The specimen is marked for pathology.  IMPRESSION: Needle localization of the left breast.  No apparent complications.   Original Report Authenticated By: Baird Lyons, M.D.    Mm Rt Plc Breast Loc Dev   1st Lesion  Inc Mammo Guide  11/23/2012  *RADIOLOGY REPORT*  Clinical Data:  Known right breast cancer  NEEDLE LOCALIZATION WITH MAMMOGRAPHIC GUIDANCE AND SPECIMEN RADIOGRAPH  Comparison:  Previous exams.  Patient presents for needle localization prior to surgery.  I met with the patient and we discussed the procedure of needle localization including benefits and alternatives. We discussed the high likelihood of a successful procedure. We discussed the risks of the procedure, including infection, bleeding, tissue injury, and further surgery. Informed, written consent was given.  Using mammographic guidance, sterile technique, 2% lidocaine and a #7 and #9 modified Kopans needles, the mass and nodularity in the upper outer quadrant of the right breast was bracketedusing a lateral approach.  The films are marked for Dr.  Derrell Lolling.  Specimen radiograph was performed at Day Surgery, and confirms the mass and clip are present in the tissue sample.  The specimen is marked for pathology.  IMPRESSION: Needle localization of the right breast.  No apparent complications.   Original Report Authenticated By: Baird Lyons, M.D.     ASSESSMENT: 52 year old female with  #1 bilateral breast cancers triple negative on the right side ER positive on the left side. She is status post bilateral lumpectomies. Clinically she's doing well. She will receive Adriamycin Cytoxan given dose dense x4 cycles followed by Taxol carbo x12 cycles.  Rationale risks benefits and side effects were discussed.    #2 patient has had an echocardiogram Port-A-Cath placed and chemotherapy teaching class.  #3 Blepharitis   PLAN:   #1 Doing well.  She will proceed with chemotherapy. She will continue to moisturize her hands and feet with Aquaphor.   2. She will see [place] back next week for cycle 4 of chemo.  3.  She will continue with Claritin.  She has not needed the baby soap or lid washing this week.  She wants to see the ophthalmologist for a detailed eye exam, I encouraged her to wait until after treatment.     All questions were answered. The patient knows to call the clinic with any problems, questions or concerns. We can certainly see the patient much sooner if necessary.  I spent 25 minutes counseling the patient face to face. The total time spent in the appointment was 30 minutes.  Cherie Ouch Lyn Hollingshead, NP Medical Oncology [facility] Phone: [phone] 03/24/2013, 12:17 PM

## 2013-03-25 ENCOUNTER — Other Ambulatory Visit: Payer: Self-pay | Admitting: Family Medicine

## 2013-03-29 ENCOUNTER — Encounter: Payer: Self-pay | Admitting: Gastroenterology

## 2013-03-31 ENCOUNTER — Encounter: Payer: Self-pay | Admitting: Adult Health

## 2013-03-31 ENCOUNTER — Other Ambulatory Visit (HOSPITAL_BASED_OUTPATIENT_CLINIC_OR_DEPARTMENT_OTHER): Payer: BC Managed Care – PPO | Admitting: Lab

## 2013-03-31 ENCOUNTER — Ambulatory Visit (HOSPITAL_BASED_OUTPATIENT_CLINIC_OR_DEPARTMENT_OTHER): Payer: BC Managed Care – PPO

## 2013-03-31 ENCOUNTER — Ambulatory Visit (HOSPITAL_BASED_OUTPATIENT_CLINIC_OR_DEPARTMENT_OTHER): Payer: BC Managed Care – PPO | Admitting: Adult Health

## 2013-03-31 VITALS — BP 125/78 | HR 83 | Temp 98.5°F | Resp 20 | Ht 63.0 in | Wt 190.8 lb

## 2013-03-31 DIAGNOSIS — H01009 Unspecified blepharitis unspecified eye, unspecified eyelid: Secondary | ICD-10-CM

## 2013-03-31 DIAGNOSIS — C50411 Malignant neoplasm of upper-outer quadrant of right female breast: Secondary | ICD-10-CM

## 2013-03-31 DIAGNOSIS — Z5111 Encounter for antineoplastic chemotherapy: Secondary | ICD-10-CM

## 2013-03-31 DIAGNOSIS — C50912 Malignant neoplasm of unspecified site of left female breast: Secondary | ICD-10-CM

## 2013-03-31 DIAGNOSIS — R5381 Other malaise: Secondary | ICD-10-CM

## 2013-03-31 DIAGNOSIS — Z716 Tobacco abuse counseling: Secondary | ICD-10-CM

## 2013-03-31 DIAGNOSIS — C50419 Malignant neoplasm of upper-outer quadrant of unspecified female breast: Secondary | ICD-10-CM

## 2013-03-31 DIAGNOSIS — C50919 Malignant neoplasm of unspecified site of unspecified female breast: Secondary | ICD-10-CM

## 2013-03-31 DIAGNOSIS — B009 Herpesviral infection, unspecified: Secondary | ICD-10-CM

## 2013-03-31 LAB — CBC WITH DIFFERENTIAL/PLATELET
BASO%: 0.2 % (ref 0.0–2.0)
Basophils Absolute: 0 10*3/uL (ref 0.0–0.1)
EOS%: 0.5 % (ref 0.0–7.0)
HGB: 11.1 g/dL — ABNORMAL LOW (ref 11.6–15.9)
MCH: 34 pg (ref 25.1–34.0)
MCHC: 34.2 g/dL (ref 31.5–36.0)
MCV: 99.7 fL (ref 79.5–101.0)
MONO%: 4.4 % (ref 0.0–14.0)
NEUT%: 77.2 % — ABNORMAL HIGH (ref 38.4–76.8)
RDW: 17.5 % — ABNORMAL HIGH (ref 11.2–14.5)

## 2013-03-31 LAB — COMPREHENSIVE METABOLIC PANEL (CC13)
AST: 17 U/L (ref 5–34)
Alkaline Phosphatase: 49 U/L (ref 40–150)
Glucose: 103 mg/dl (ref 70–140)
Sodium: 139 mEq/L (ref 136–145)
Total Bilirubin: 0.31 mg/dL (ref 0.20–1.20)
Total Protein: 5.7 g/dL — ABNORMAL LOW (ref 6.4–8.3)

## 2013-03-31 MED ORDER — ATIVAN 0.5 MG PO TABS: 0.5000 mg | ORAL_TABLET | Freq: Four times a day (QID) | ORAL | Status: DC | PRN

## 2013-03-31 MED ORDER — PACLITAXEL CHEMO INJECTION 300 MG/50ML
80.0000 mg/m2 | Freq: Once | INTRAVENOUS | Status: AC
  Administered 2013-03-31: 156 mg via INTRAVENOUS
  Filled 2013-03-31: qty 26

## 2013-03-31 MED ORDER — SODIUM CHLORIDE 0.9 % IJ SOLN
10.0000 mL | INTRAMUSCULAR | Status: DC | PRN
  Administered 2013-03-31: 10 mL
  Filled 2013-03-31: qty 10

## 2013-03-31 MED ORDER — ONDANSETRON 16 MG/50ML IVPB (CHCC)
16.0000 mg | Freq: Once | INTRAVENOUS | Status: AC
  Administered 2013-03-31: 16 mg via INTRAVENOUS

## 2013-03-31 MED ORDER — HEPARIN SOD (PORK) LOCK FLUSH 100 UNIT/ML IV SOLN
500.0000 [IU] | Freq: Once | INTRAVENOUS | Status: AC | PRN
  Administered 2013-03-31: 500 [IU]
  Filled 2013-03-31: qty 5

## 2013-03-31 MED ORDER — NICOTINE 21 MG/24HR TD PT24: 1.0000 | MEDICATED_PATCH | TRANSDERMAL | Status: DC

## 2013-03-31 MED ORDER — ACETAMINOPHEN 325 MG PO TABS
650.0000 mg | ORAL_TABLET | Freq: Once | ORAL | Status: AC
  Administered 2013-03-31: 650 mg via ORAL

## 2013-03-31 MED ORDER — DIPHENHYDRAMINE HCL 50 MG/ML IJ SOLN
12.5000 mg | Freq: Once | INTRAMUSCULAR | Status: AC
  Administered 2013-03-31: 12.5 mg via INTRAVENOUS

## 2013-03-31 MED ORDER — DEXAMETHASONE SODIUM PHOSPHATE 20 MG/5ML IJ SOLN
20.0000 mg | Freq: Once | INTRAMUSCULAR | Status: AC
  Administered 2013-03-31: 20 mg via INTRAVENOUS

## 2013-03-31 MED ORDER — SODIUM CHLORIDE 0.9 % IV SOLN
Freq: Once | INTRAVENOUS | Status: AC
  Administered 2013-03-31: 11:00:00 via INTRAVENOUS

## 2013-03-31 MED ORDER — FAMOTIDINE IN NACL 20-0.9 MG/50ML-% IV SOLN
20.0000 mg | Freq: Once | INTRAVENOUS | Status: AC
  Administered 2013-03-31: 20 mg via INTRAVENOUS

## 2013-03-31 MED ORDER — SODIUM CHLORIDE 0.9 % IV SOLN
300.0000 mg | Freq: Once | INTRAVENOUS | Status: AC
  Administered 2013-03-31: 300 mg via INTRAVENOUS
  Filled 2013-03-31: qty 30

## 2013-03-31 MED ORDER — ACYCLOVIR 5 % EX OINT: 1.0000 "application " | TOPICAL_OINTMENT | CUTANEOUS | Status: DC | PRN

## 2013-03-31 NOTE — Progress Notes (Signed)
OFFICE PROGRESS NOTE  CC  Amy Manns, MD [address] [place] [address] [place] [address] Dr. Claud Kelp  Dr. Antony Blackbird  DIAGNOSIS: 52 year old female with new diagnosis of bilateral breast cancers.   STAGE:  Cancer of upper-outer quadrant of female breast  Primary site: Breast (Bilateral)  Staging method: AJCC 7th Edition  Clinical: Stage IA (T1c, N0, cM0)  Summary: Stage IA (T1c, N0, cM0)   PRIOR THERAPY: #1Patient recently underwent screening mammograms and she was found to have bilateral breast abnormalities.diagnostic mammogram showed in the right breast an ill-defined 2 cm mass with a few associated well defined microcalcifications over the upper outer quadrant. Spot compression images of the left breast demonstrated an ill defined spiculated 8 mm mass centrally. Ultrasound showed irregular bordered heterogeneous hypoechoic solid mass at the 10:00 position of the right breast 6 cm from the nipple measuring 1.1 x 1.3 x 1.4 cm. Also in this location was an adjacent smaller irregular hypoechoic mass measuring 3 x 5 x 6 mm. The smaller mass was 1 cm from the large more irregular mass. Ultrasound of the axilla demonstrated single lymph node with thickened cortex. Ultrasound of the left breast demonstrated an ill-defined hypoechoic mass with distal acoustic shadowing 7 to 8:00 position 3 cm from the nipple located deep and measuring 5 x 8 x 8 mm ultrasound of the left axilla showed normal appearing lymph nodes.   #2Patient went on to have needle core biopsies performed of both of the masses. 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 The tumor was ER positive PR positive with a Ki-67 of 9% the second tumor was ER negative PR negative HER-2/neu negative with Ki-6792% and elevated.  #3 Patient had MRIs of the breasts performed. There was  noted to be 1.7 cm irregular enhancing mass located within the upper-outer quadrant of the right breast with 3 adjacent worrisome enhancing satellite nodules in aggregate the nodules and mass measures 3.8 cm. In the left breast 8 mm irregular enhancing mass located within the central left breast was noted. No evidence of axillary or internal mammary adenopathy.  #4patient is status post bilateral lumpectomies. The right breast revealed a 1.5 cm grade 3 invasive ductal carcinoma ER/PR negative HER-2/neu negative Ki-67 92%. Sentinel node was negative. Left breast showed 1.0 cm grade 3 invasive ductal carcinoma ER/PR positive HER-2/neu negative sentinel node was negative.  #5 patient is to begin adjuvant chemotherapy consisting of Adriamycin Cytoxan every 2 weeks for a total of 4 cycles 12/30/12. This would then be followed by Taxol and carboplatinum weekly for 12 weeks. Patient will need adjuvant radiation therapy. Because one of the tumor is ER positive she will also receive antiestrogen therapy.  CURRENT THERAPY: Taxol Carbo week 4  INTERVAL HISTORY: Amy Leonard 52 y.o. female returns for followup visit prior to her fourth week of Taxol/Carbo.   She continues to do well with the chemotherapy.  She is fatigued.  Otherwise, she denies fevers, chills, nausea, vomiting, constipation, diarrhea, numbness or any further concerns.  Her feet remain erythematous, and the skin remains intact with no peeling. Otherwise, a 10 point ROS is neg.   MEDICAL HISTORY: Past Medical History  Diagnosis Date  . Elevated WBC count     nl diff  . Chronic back pain     spinal stenosis  . Tobacco abuse   .  Anxiety and depression   . HSV infection     recurrent (side and buttox)  . Migraine   . Glaucoma     ?  . Carpal tunnel syndrome   . HLD (hyperlipidemia)   . Folliculitis 05/05/2011  . Anxiety   . Depression   . 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 GERD (gastroesophageal reflux disease)   . Hypertension    . Breast cancer   . Wears dentures     top    ALLERGIES:  is allergic to bupropion hcl; chlorhexidine gluconate; hydrocod polst-cpm polst er; lisinopril; and naproxen sodium.  MEDICATIONS:  Current Outpatient Prescriptions  Medication Sig Dispense Refill  . Acetaminophen (TYLENOL ARTHRITIS PAIN PO) Take 2 tablets by mouth as needed (pain).       Marland Kitchen acyclovir (ZOVIRAX) 5 % ointment Apply 1 application topically as needed (flare-up).       Marland Kitchen aspirin 81 MG chewable tablet Chew 81 mg by mouth daily.      . ATIVAN 0.5 MG tablet Take 1 tablet (0.5 mg total) by mouth every 6 (six) hours as needed (Nausea or vomiting).  45 tablet  0  . atorvastatin (LIPITOR) 10 MG tablet TAKE 1 TABLET (10 MG TOTAL) BY MOUTH DAILY.  90 tablet  0  . calcium carbonate (TUMS - DOSED IN MG ELEMENTAL CALCIUM) 500 MG chewable tablet Chew by mouth as directed.        . ciprofloxacin (CIPRO) 500 MG tablet Take 1 tablet by mouth 2 (two) times daily.      . COMPRO 25 MG suppository       . cyclobenzaprine (FLEXERIL) 10 MG tablet Take 10 mg by mouth as needed (muscle spasm).       Marland Kitchen dexamethasone (DECADRON) 4 MG tablet Take 1 tablet (4 mg total) by mouth as directed. 2 tablets PO the day after chemo then 2 tablets PO BID x 2 days.  30 tablet  4  . fluconazole (DIFLUCAN) 100 MG tablet Take 2 tablets (200 mg total) by mouth daily.  14 tablet  0  . KLOR-CON M20 20 MEQ tablet Take 1 tablet by mouth daily.      Marland Kitchen lidocaine-prilocaine (EMLA) cream Apply topically as needed.  30 g  8  . losartan (COZAAR) 50 MG tablet Take 1 tablet (50 mg total) by mouth daily.  90 tablet  2  . nystatin (MYCOSTATIN) 100000 UNIT/ML suspension Take 5 mLs (500,000 Units total) by mouth 2 (two) times daily.  240 mL  3  . omeprazole (PRILOSEC) 40 MG capsule Take 1 capsule (40 mg total) by mouth 2 (two) times daily.  180 capsule  3  . ondansetron (ZOFRAN) 8 MG tablet       . prochlorperazine (COMPAZINE) 10 MG tablet       . prochlorperazine (COMPAZINE)  25 MG suppository Place 1 suppository (25 mg total) rectally every 12 (twelve) hours as needed for nausea.  12 suppository  3  . simethicone (MYLICON) 80 MG chewable tablet Chew 1 tablet (80 mg total) by mouth every 8 (eight) hours as needed for flatulence.  60 tablet  3   No current facility-administered medications for this visit.    SURGICAL HISTORY:  Past Surgical History  Procedure Laterality Date  . Btl    . Epidural steroid injection  2008  . Colonoscopy    . Upper gi endoscopy    . Partial mastectomy with needle localization and axillary sentinel lymph node bx Bilateral 11/23/2012  Procedure: PARTIAL MASTECTOMY WITH NEEDLE LOCALIZATION AND AXILLARY SENTINEL LYMPH NODE BX;  Surgeon: Ernestene Mention, MD;  Location: [facility];  Service: General;  Laterality: Bilateral;  . Portacath placement Right 11/23/2012    Procedure: INSERTION PORT-A-CATH;  Surgeon: Ernestene Mention, MD;  Location: [facility];  Service: General;  Laterality: Right;    REVIEW OF SYSTEMS:  Pertinent items are noted in HPI.   PHYSICAL EXAMINATION: Blood pressure 125/78, pulse 83, temperature 98.5 F (36.9 C), temperature source Oral, resp. rate 20, height 5\' 3"  (1.6 m), weight 190 lb 12.8 oz (86.546 kg). Body mass index is 33.81 kg/(m^2). General: Patient is a well appearing female in no acute distress HEENT: PERRLA, no scleral injection, crusting, or puffiness of eyes noted.  sclerae anicteric no conjunctival pallor, MMM Neck: supple, no palpable adenopathy Lungs: clear to auscultation bilaterally, no wheezes, rhonchi, or rales Cardiovascular: regular rhythm, S1, S2, no murmurs, rubs or gallops Abdomen: Soft, non-tender, non-distended, normoactive bowel sounds, no HSM Extremities: warm and well perfused, no clubbing, cyanosis, or edema, entire soles of feet are not erythematous no skin breakdown noted, slight peeling on sole of left foot, well moisturizied.  Her fingertips have no  noted abnormality Skin: No rashes or lesions Neuro: Non-focal Breasts: Bilateral lumpectomy sites without nodularity, no masses or skin changes in either breast. ECOG PERFORMANCE STATUS: 0 - Asymptomatic  LABORATORY DATA: Lab Results  Component Value Date   WBC 6.4 03/31/2013   HGB 11.1* 03/31/2013   HCT 32.5* 03/31/2013   MCV 99.7 03/31/2013   PLT 210 03/31/2013      Chemistry      Component Value Date/Time   NA 138 03/24/2013 1113   NA 135 01/08/2013 0555   K 3.9 03/24/2013 1113   K 4.0 01/08/2013 0555   CL 109* 02/17/2013 1120   CL 100 01/08/2013 0555   CO2 24 03/24/2013 1113   CO2 28 01/08/2013 0555   BUN 13.5 03/24/2013 1113   BUN 10 01/08/2013 0555   CREATININE 0.7 03/24/2013 1113   CREATININE 0.62 01/08/2013 0555      Component Value Date/Time   CALCIUM 8.3* 03/24/2013 1113   CALCIUM 9.1 01/08/2013 0555   ALKPHOS 55 03/24/2013 1113   ALKPHOS 67 01/05/2013 2245   AST 15 03/24/2013 1113   AST 10 01/05/2013 2245   ALT 20 03/24/2013 1113   ALT 15 01/05/2013 2245   BILITOT 0.33 03/24/2013 1113   BILITOT 0.5 01/05/2013 2245    ADDITIONAL INFORMATION: 1. CHROMOGENIC IN-SITU HYBRIDIZATION Results: HER-2/NEU BY CISH - NO AMPLIFICATION OF HER-2 DETECTED. RESULT RATIO OF HER2: CEP 17 SIGNALS 1.00 AVERAGE HER2 COPY NUMBER PER CELL 1.55 REFERENCE RANGE NEGATIVE HER2/Chr17 Ratio <2.0 and Average HER2 copy number <4.0 EQUIVOCAL HER2/Chr17 Ratio <2.0 and Average HER2 copy number 4.0 and <6.0 POSITIVE HER2/Chr17 Ratio >=2.0 and/or Average HER2 copy number >=6.0 Pecola Leisure MD Pathologist, Electronic Signature ( Signed 12/01/2012) 3. CHROMOGENIC IN-SITU HYBRIDIZATION Results: HER-2/NEU BY CISH - NO AMPLIFICATION OF HER-2 DETECTED. RESULT RATIO OF HER2: CEP 17 SIGNALS 1.22 AVERAGE HER2 COPY NUMBER PER CELL 1.40 REFERENCE RANGE NEGATIVE HER2/Chr17 Ratio <2.0 and Average HER2 copy number <4.0 EQUIVOCAL HER2/Chr17 Ratio <2.0 and Average HER2 copy number 4.0 and <6.0 POSITIVE HER2/Chr17 Ratio  >=2.0 and/or Average HER2 copy number >=6.0 1 of 5 FINAL for ASHA, GRUMBINE ([id number]) ADDITIONAL INFORMATION:(continued) Pecola Leisure MD Pathologist, Electronic Signature ( Signed 12/01/2012) FINAL DIAGNOSIS Diagnosis 1. Breast, lumpectomy, Right - INVASIVE DUCTAL CARCINOMA, GRADE  III, SEE COMMENT. - INVASIVE CARCINOMA IS 0.9 CM FROM NEAREST MARGIN (INFERIOR). - NO LYMPHOVASCULAR INVASION IDENTIFIED. - HIGH GRADE DUCTAL CARCINOMA IN SITU - IN SITU CARCINOMA IS 1 MM FROM THE NEAREST LATERAL MARGIN - SEE TUMOR SYNOPTIC TEMPLATE BELOW. 2. Lymph nodes, regional resection, Right axilla - ELEVEN LYMPH NODES, NEGATIVE FOR TUMOR (0/11). 3. Breast, lumpectomy, Left - INVASIVE DUCTAL CARCINOMA, GRADE I, SEE COMMENT. - LYMPHOVASCULAR INVASION IDENTIFIED - INVASIVE CARCINOMA IS BROADLY PRESENT AT POSTERIOR MARGIN - DUCTAL CARCINOMA IN SITU, GRADE I. - IN SITU CARCINOMA IS FOCALLY PRESENT AT POSTERIOR MARGIN - SEE TUMOR SYNOPTIC TEMPLATE BELOW. 4. Breast, excision, Left - BENIGN BREAST TISSUE, SEE COMMENT. - NEGATIVE FOR ATYPIA OR MALIGNANCY. - SURGICAL MARGIN, NEGATIVE FOR ATYPIA OR MALIGNANCY. 5. Lymph node, sentinel, biopsy, Left axillary - ONE LYMPH NODE, POSITIVE FOR METASTATIC MAMMARY CARCINOMA (1/1). Microscopic Comment 1. BREAST, INVASIVE TUMOR, WITH LYMPH NODE SAMPLING Specimen, including laterality: Right breast Procedure: Lumpectomy Grade: III of III Tubule formation: 3 Nuclear pleomorphism: 3 Mitotic: 3 Tumor size (gross measurement: 1.5 cm Margins: Invasive, distance to closest margin: 0.9 cm In-situ, distance to closest margin: 0.1 cm If margin positive, focally or broadly: N/A Lymphovascular invasion: Absent Ductal carcinoma in situ: Present Grade: III of III Extensive intraductal component: Absent Lobular neoplasia: Absent Tumor focality: Multifocal Treatment effect: None 2 of 5 FINAL for TAKELIA, URIETA ([id number]) Microscopic Comment(continued) If  present, treatment effect in breast tissue, lymph nodes or both: N/A Extent of tumor: Skin: N/A Nipple: N/A Skeletal muscle: N/A Lymph nodes: # examined: 11 (Part 2) Lymph nodes with metastasis: 0 Breast prognostic profile: Estrogen receptor: Repeated, previous study demonstrated 0% positivity ([id number]) Progesterone receptor: Repeated, previous study demonstrated 0% positivity ([id number]) Her 2 neu: Repeated, previous study demonstrated no amplification (1.38) ([id number]) Ki-67: Not repeated, previous study demonstrated 92% proliferation rate ([id number]) Non-neoplastic breast: Previous biopsy site, fibrocystic change, sclerosing adenosis and benign calcifications in benign ducts and lobules. TNM: pT1c, pN0, pMX Comments: There are a total of four distinct areas of abnormality identified. The first 1.5 cm corresponds to the invasive ductal carcinoma. The second 0.9 cm area consists of high grade ductal carcinoma in situ with associated fibrocystic change and fibroadenomatoid nodules. In this focus, the in situ carcinoma is 1 mm from the nearest lateral margin. The third 0.4 cm area consists of fat necrosis and fibrosis. There are no atypical or malignant findings in this area. The fourth and final 0.3 cm area demonstrate non-neoplastic findings to include fibrocystic change. There are no atypical or malignant epithelial findings in this area either. 3. BREAST, INVASIVE TUMOR, WITH LYMPH NODE SAMPLING Specimen, including laterality: Left breast Procedure: Lumpectomy Grade: I of III Tubule formation: 3 Nuclear pleomorphism: 3 Mitotic: 1 Tumor size (gross measurement): 1.0 cm Margins: Invasive, distance to closest margin: Present at posterior In-situ, distance to closest margin: Present at posterior If margin positive, focally or broadly: Broadly (invasive) and focally (in situ) Lymphovascular invasion: Present Ductal carcinoma in situ: Present Grade: I of III Extensive  intraductal component: Absent Lobular neoplasia: Absent Tumor focality: Unifocal Treatment effect: None If present, treatment effect in breast tissue, lymph nodes or both: N/A Extent of tumor: Skin: N/A Nipple: N/A Skeletal muscle: N/A Lymph nodes: # examined: 1 (Part 5) Lymph nodes with metastasis: 1 Isolated tumor cells (< 0.2 mm): 0 Micrometastasis: (> 0.2 mm and < 2.0 mm): 1 Macrometastasis: (> 2.0 mm): 0 3 of 5 FINAL for DORIANNE, PERRET ([id number]) Microscopic Comment(continued) Extracapsular extension: Present Breast  prognostic profile: Estrogen receptor: Not repeated, previous study demonstrated 100% positivity ([id number]). Progesterone receptor: Not repeated, previous study demonstrated 25% positivity ([id number]) Her 2 neu: Repeated, previous study demonstrated no amplification (1.42) ([id number]) Ki-67: Not repeated, previous study demonstrated 9% proliferation rate ([id number]) Non-neoplastic breast: Previous biopsy site, fibrocystic change and microcalcifications in benign ducts and lobules. TNM: pT1b, pN28mi, PMX Comments: The additional 1.0 cm and 1.1 cm hemorrhagic areas grossly identified consist of benign breast tissue with microcalcifications and parenchymal hemorrhage. There are no atypical or malignant findings in these areas. 4. The surgical resection margin(s) of the specimen were inked and microscopically evaluated. There is no mass grossly identified. Representative sections demonstrate predominantly benign fibrovascular and adipose tissue with focal areas of fibrocytic change and sclerosing adenosis. There are no atypical or malignant epithelial or stromal findings identified. 5. There are microscopic foci of intranodal metastatic mammary tumor deposits spanning up to 1 mm. There is focal extracapsular extension identified. (CR:kh 11-25-12) [place] RUND DO Pathologist, Electronic Signature (Case signed 11/25/2012) Specimen Gross and Clinical  Information   RADIOGRAPHIC STUDIES:  Chest 2 View  11/19/2012  *RADIOLOGY REPORT*  Clinical Data: Preop for breast cancer  CHEST - 2 VIEW  Comparison: None.  Findings: Cardiomediastinal silhouette is unremarkable.  No acute infiltrate or pleural effusion.  No pulmonary edema.  Bony thorax is unremarkable.  IMPRESSION: No active disease.   Original Report Authenticated By: Natasha Mead, M.D.    Nm Sentinel Node Inj-no Rpt (breast)  11/23/2012  CLINICAL DATA: Bilateral breast cancer   Sulfur colloid was injected intradermally by the nuclear medicine  technologist for breast cancer sentinel node localization.     Dg Chest Portable 1 View  11/23/2012  *RADIOLOGY REPORT*  Clinical Data: 52 year old female status post Port-A-Cath placement.  Breast cancer.  PORTABLE CHEST - 1 VIEW  Comparison: 11/19/2012.  Findings: Portable semi upright AP view 1441 hours.  Right chest subclavian approach Port-A-Cath placed.  Acute angulation of the catheter tubing at the level of the right axilla.  Tip at the level of the cavoatrial junction.  Interval postoperative changes to the right chest wall with subcutaneous appearing drain in place.  No pneumothorax.  No pleural effusion or pulmonary edema.  Mildly increased bibasilar opacity compatible with atelectasis.  Cardiac size and mediastinal contours are within normal limits.  Visualized tracheal air column is within normal limits.  IMPRESSION: 1.  Right subclavian approach chest Port-A-Cath in place.  Tip at the level of the cavoatrial junction.  Mildly angulated catheter tubing at the level of the axilla. 2.  Interval postoperative changes to the right chest wall. No pneumothorax. 3.  Mild dependent atelectasis.   Original Report Authenticated By: Erskine Speed, M.D.    Dg Fluoro Guide Cv Line-no Report  11/23/2012  CLINICAL DATA: bil breast cancer   FLOURO GUIDE CV LINE  Fluoroscopy was utilized by the requesting physician.  No radiographic  interpretation.     Mm Lt Plc  Breast Loc Dev   1st Lesion  Inc Mammo Guide  11/23/2012  *RADIOLOGY REPORT*  Clinical Data:  Left breast cancer  NEEDLE LOCALIZATION WITH MAMMOGRAPHIC GUIDANCE AND SPECIMEN RADIOGRAPH  Comparison:  Previous exams.  Patient presents for needle localization prior to surgery.  I met with the patient and we discussed the procedure of needle localization including benefits and alternatives. We discussed the high likelihood of a successful procedure. We discussed the risks of the procedure, including infection, bleeding, tissue injury, and further surgery. Informed, written consent was  given.  Using mammographic guidance, sterile technique, 2% lidocaine and a #9 modified Kopans needle, the mass and clip in the central aspect of the left breast was localized using a superior approach.  The films are marked for Dr. Derrell Lolling.  Specimen radiograph was performed at [facility], and confirms the mass and clip were present in the tissue sample. The clip was located at the margin of the surgical specimen.  Dr.  Derrell Lolling states she had taken more tissue at the margin to clear the border.  The specimen is marked for pathology.  IMPRESSION: Needle localization of the left breast.  No apparent complications.   Original Report Authenticated By: Baird Lyons, M.D.    Mm Rt Plc Breast Loc Dev   1st Lesion  Inc Mammo Guide  11/23/2012  *RADIOLOGY REPORT*  Clinical Data:  Known right breast cancer  NEEDLE LOCALIZATION WITH MAMMOGRAPHIC GUIDANCE AND SPECIMEN RADIOGRAPH  Comparison:  Previous exams.  Patient presents for needle localization prior to surgery.  I met with the patient and we discussed the procedure of needle localization including benefits and alternatives. We discussed the high likelihood of a successful procedure. We discussed the risks of the procedure, including infection, bleeding, tissue injury, and further surgery. Informed, written consent was given.  Using mammographic guidance, sterile technique, 2% lidocaine and a #7  and #9 modified Kopans needles, the mass and nodularity in the upper outer quadrant of the right breast was bracketedusing a lateral approach.  The films are marked for Dr. Derrell Lolling.  Specimen radiograph was performed at Day Surgery, and confirms the mass and clip are present in the tissue sample.  The specimen is marked for pathology.  IMPRESSION: Needle localization of the right breast.  No apparent complications.   Original Report Authenticated By: Baird Lyons, M.D.     ASSESSMENT: 52 year old female with  #1 bilateral breast cancers triple negative on the right side ER positive on the left side. She is status post bilateral lumpectomies. Clinically she's doing well. She will receive Adriamycin Cytoxan given dose dense x4 cycles followed by Taxol carbo x12 cycles.  Rationale risks benefits and side effects were discussed.    #2 patient has had an echocardiogram Port-A-Cath placed and chemotherapy teaching class.  #3 Blepharitis   PLAN:   #1 Doing well.  She will proceed with chemotherapy. She will continue to moisturize her hands and feet with Aquaphor.   2. She will see [place] back next week for cycle 5 of chemo.  3.  She will continue with Claritin.  She has not needed the baby soap or lid washing this week.  She wants to see the ophthalmologist for a detailed eye exam, I encouraged her to wait until after treatment.     All questions were answered. The patient knows to call the clinic with any problems, questions or concerns. We can certainly see the patient much sooner if necessary.  I spent 25 minutes counseling the patient face to face. The total time spent in the appointment was 30 minutes.  Cherie Ouch Lyn Hollingshead, NP Medical Oncology [facility] Phone: [phone] 03/31/2013, 9:59 AM

## 2013-03-31 NOTE — Patient Instructions (Addendum)
Use the nicotine patch every day for 6 weeks.  Then we will decrease your dose to 14mg .  Use gum if needed for early morning cravings.  Should you have headaches, nausea, shakiness, call me, we likely need to decrease your dose.

## 2013-03-31 NOTE — Patient Instructions (Addendum)
[facility] Discharge Instructions for Patients Receiving Chemotherapy  Today you received the following chemotherapy agents taxol/ carboplatin  To help prevent nausea and vomiting after your treatment, we encourage you to take your nausea medication as needed   If you develop nausea and vomiting that is not controlled by your nausea medication, call the clinic.   BELOW ARE SYMPTOMS THAT SHOULD BE REPORTED IMMEDIATELY:  *FEVER GREATER THAN 100.5 F  *CHILLS WITH OR WITHOUT FEVER  NAUSEA AND VOMITING THAT IS NOT CONTROLLED WITH YOUR NAUSEA MEDICATION  *UNUSUAL SHORTNESS OF BREATH  *UNUSUAL BRUISING OR BLEEDING  TENDERNESS IN MOUTH AND THROAT WITH OR WITHOUT PRESENCE OF ULCERS  *URINARY PROBLEMS  *BOWEL PROBLEMS  UNUSUAL RASH Items with * indicate a potential emergency and should be followed up as soon as possible.  Feel free to call the clinic you have any questions or concerns. The clinic phone number is [phone].

## 2013-04-02 ENCOUNTER — Telehealth: Payer: Self-pay | Admitting: Medical Oncology

## 2013-04-02 NOTE — Telephone Encounter (Signed)
Patient stating she received f/u ltr for an endoscopy from her GI doctor and she is questioning whether she should wait for this till chemotherapy is completed. Reviewed with NP, patient to wait and not to have endoscopy until completion of chemotherapy. Patient verbalized understanding. Denies further questions.

## 2013-04-05 ENCOUNTER — Telehealth: Payer: Self-pay | Admitting: Gastroenterology

## 2013-04-05 NOTE — Telephone Encounter (Signed)
Informed patient to stay on Omeprazole 40 mg one tablet by mouth twice daily. Also that her recall has been changed and new one put in system. We will contact her next year for Endoscopy. Pt agreed.

## 2013-04-05 NOTE — Telephone Encounter (Signed)
She should remain on omeprazole 40 mg po bid. I would like her to schedule her EGD as soon as she can but no later than 09/2013. Please extend the recall to 09/2013

## 2013-04-05 NOTE — Telephone Encounter (Signed)
Told patient that she still needs to continue taking Prilosec twice a day every day. Patient is starting 8 more weeks of Chemo therapy. After she has finished with Chemo then she will start Radiation but she is not sure for how long. Wants to notify us that she cannot schedule Endoscopy right now. Told her I will ask Dr. Russella Dar about changing her Recall Endoscopy.

## 2013-04-07 ENCOUNTER — Other Ambulatory Visit (HOSPITAL_BASED_OUTPATIENT_CLINIC_OR_DEPARTMENT_OTHER): Payer: BC Managed Care – PPO | Admitting: Lab

## 2013-04-07 ENCOUNTER — Ambulatory Visit (HOSPITAL_BASED_OUTPATIENT_CLINIC_OR_DEPARTMENT_OTHER): Payer: BC Managed Care – PPO

## 2013-04-07 ENCOUNTER — Ambulatory Visit (HOSPITAL_BASED_OUTPATIENT_CLINIC_OR_DEPARTMENT_OTHER): Payer: BC Managed Care – PPO | Admitting: Adult Health

## 2013-04-07 ENCOUNTER — Telehealth: Payer: Self-pay | Admitting: *Deleted

## 2013-04-07 ENCOUNTER — Encounter: Payer: Self-pay | Admitting: Adult Health

## 2013-04-07 VITALS — BP 149/86 | HR 101 | Temp 98.3°F | Resp 20 | Ht 63.0 in | Wt 192.1 lb

## 2013-04-07 DIAGNOSIS — C50912 Malignant neoplasm of unspecified site of left female breast: Secondary | ICD-10-CM

## 2013-04-07 DIAGNOSIS — C50919 Malignant neoplasm of unspecified site of unspecified female breast: Secondary | ICD-10-CM

## 2013-04-07 DIAGNOSIS — C50419 Malignant neoplasm of upper-outer quadrant of unspecified female breast: Secondary | ICD-10-CM

## 2013-04-07 DIAGNOSIS — C50411 Malignant neoplasm of upper-outer quadrant of right female breast: Secondary | ICD-10-CM

## 2013-04-07 DIAGNOSIS — Z5111 Encounter for antineoplastic chemotherapy: Secondary | ICD-10-CM

## 2013-04-07 DIAGNOSIS — R5383 Other fatigue: Secondary | ICD-10-CM

## 2013-04-07 DIAGNOSIS — G629 Polyneuropathy, unspecified: Secondary | ICD-10-CM

## 2013-04-07 DIAGNOSIS — R609 Edema, unspecified: Secondary | ICD-10-CM

## 2013-04-07 DIAGNOSIS — R5381 Other malaise: Secondary | ICD-10-CM

## 2013-04-07 LAB — COMPREHENSIVE METABOLIC PANEL (CC13)
AST: 18 U/L (ref 5–34)
Albumin: 3.2 g/dL — ABNORMAL LOW (ref 3.5–5.0)
Alkaline Phosphatase: 46 U/L (ref 40–150)
BUN: 12.2 mg/dL (ref 7.0–26.0)
Calcium: 8.3 mg/dL — ABNORMAL LOW (ref 8.4–10.4)
Creatinine: 0.6 mg/dL (ref 0.6–1.1)
Glucose: 102 mg/dl (ref 70–140)
Potassium: 3.6 mEq/L (ref 3.5–5.1)

## 2013-04-07 LAB — CBC WITH DIFFERENTIAL/PLATELET
BASO%: 0 % (ref 0.0–2.0)
Basophils Absolute: 0 10*3/uL (ref 0.0–0.1)
EOS%: 0.4 % (ref 0.0–7.0)
HCT: 33.5 % — ABNORMAL LOW (ref 34.8–46.6)
HGB: 11.4 g/dL — ABNORMAL LOW (ref 11.6–15.9)
LYMPH%: 17.6 % (ref 14.0–49.7)
MCH: 34.2 pg — ABNORMAL HIGH (ref 25.1–34.0)
MCHC: 34 g/dL (ref 31.5–36.0)
MCV: 100.6 fL (ref 79.5–101.0)
MONO%: 8.3 % (ref 0.0–14.0)
NEUT%: 73.7 % (ref 38.4–76.8)
Platelets: 230 10*3/uL (ref 145–400)

## 2013-04-07 MED ORDER — SODIUM CHLORIDE 0.9 % IV SOLN
Freq: Once | INTRAVENOUS | Status: AC
  Administered 2013-04-07: 11:00:00 via INTRAVENOUS

## 2013-04-07 MED ORDER — SODIUM CHLORIDE 0.9 % IV SOLN
300.0000 mg | Freq: Once | INTRAVENOUS | Status: AC
  Administered 2013-04-07: 300 mg via INTRAVENOUS
  Filled 2013-04-07: qty 30

## 2013-04-07 MED ORDER — SODIUM CHLORIDE 0.9 % IJ SOLN
10.0000 mL | INTRAMUSCULAR | Status: DC | PRN
  Administered 2013-04-07: 10 mL
  Filled 2013-04-07: qty 10

## 2013-04-07 MED ORDER — FAMOTIDINE IN NACL 20-0.9 MG/50ML-% IV SOLN
20.0000 mg | Freq: Once | INTRAVENOUS | Status: AC
  Administered 2013-04-07: 20 mg via INTRAVENOUS

## 2013-04-07 MED ORDER — HEPARIN SOD (PORK) LOCK FLUSH 100 UNIT/ML IV SOLN
500.0000 [IU] | Freq: Once | INTRAVENOUS | Status: AC | PRN
  Administered 2013-04-07: 500 [IU]
  Filled 2013-04-07: qty 5

## 2013-04-07 MED ORDER — DEXAMETHASONE SODIUM PHOSPHATE 20 MG/5ML IJ SOLN
20.0000 mg | Freq: Once | INTRAMUSCULAR | Status: AC
  Administered 2013-04-07: 20 mg via INTRAVENOUS

## 2013-04-07 MED ORDER — ONDANSETRON 16 MG/50ML IVPB (CHCC)
16.0000 mg | Freq: Once | INTRAVENOUS | Status: AC
  Administered 2013-04-07: 16 mg via INTRAVENOUS

## 2013-04-07 MED ORDER — DIPHENHYDRAMINE HCL 50 MG/ML IJ SOLN
12.5000 mg | Freq: Once | INTRAMUSCULAR | Status: AC
  Administered 2013-04-07: 12.5 mg via INTRAVENOUS

## 2013-04-07 MED ORDER — ACETAMINOPHEN 325 MG PO TABS
650.0000 mg | ORAL_TABLET | Freq: Once | ORAL | Status: AC
  Administered 2013-04-07: 650 mg via ORAL

## 2013-04-07 MED ORDER — PACLITAXEL CHEMO INJECTION 300 MG/50ML
80.0000 mg/m2 | Freq: Once | INTRAVENOUS | Status: AC
  Administered 2013-04-07: 156 mg via INTRAVENOUS
  Filled 2013-04-07: qty 26

## 2013-04-07 MED ORDER — GABAPENTIN 100 MG PO CAPS: 100.0000 mg | ORAL_CAPSULE | Freq: Three times a day (TID) | ORAL | Status: DC

## 2013-04-07 NOTE — Patient Instructions (Addendum)
Proceed with chemotherapy.  Please call us if you have any questions or concerns.   Gabapentin capsules or tablets What is this medicine? GABAPENTIN (GA ba pen tin) is used to control partial seizures in adults with epilepsy. It is also used to treat certain types of nerve pain. This medicine may be used for other purposes; ask your health care provider or pharmacist if you have questions. What should I tell my health care provider before I take this medicine? They need to know if you have any of these conditions: -kidney disease -suicidal thoughts, plans, or attempt; a previous suicide attempt by you or a family member -an unusual or allergic reaction to gabapentin, other medicines, foods, dyes, or preservatives -pregnant or trying to get pregnant -breast-feeding How should I use this medicine? Take this medicine by mouth. Swallow it with a drink of water. Follow the directions on the prescription label. If this medicine upsets your stomach, take it with food or milk. Take your medicine at regular intervals. Do not take it more often than directed. If you are directed to break the 600 or 800 mg tablets in half as part of your dose, the extra half tablet should be used for the next dose. If you have not used the extra half tablet within 3 days, it should be thrown away. A special MedGuide will be given to you by the pharmacist with each prescription and refill. Be sure to read this information carefully each time. Talk to your pediatrician regarding the use of this medicine in children. Special care may be needed. Overdosage: If you think you have taken too much of this medicine contact a poison control center or emergency room at once. NOTE: This medicine is only for you. Do not share this medicine with others. What if I miss a dose? If you miss a dose, take it as soon as you can. If it is almost time for your next dose, take only that dose. Do not take double or extra doses. What may interact  with this medicine? -antacids -hydrocodone -morphine -naproxen -sevelamer This list may not describe all possible interactions. Give your health care provider a list of all the medicines, herbs, non-prescription drugs, or dietary supplements you use. Also tell them if you smoke, drink alcohol, or use illegal drugs. Some items may interact with your medicine. What should I watch for while using this medicine? Visit your doctor or health care professional for regular checks on your progress. You may want to keep a record at home of how you feel your condition is responding to treatment. You may want to share this information with your doctor or health care professional at each visit. You should contact your doctor or health care professional if your seizures get worse or if you have any new types of seizures. Do not stop taking this medicine or any of your seizure medicines unless instructed by your doctor or health care professional. Stopping your medicine suddenly can increase your seizures or their severity. Wear a medical identification bracelet or chain if you are taking this medicine for seizures, and carry a card that lists all your medications. You may get drowsy, dizzy, or have blurred vision. Do not drive, use machinery, or do anything that needs mental alertness until you know how this medicine affects you. To reduce dizzy or fainting spells, do not sit or stand up quickly, especially if you are an older patient. Alcohol can increase drowsiness and dizziness. Avoid alcoholic drinks. Your mouth may  get dry. Chewing sugarless gum or sucking hard candy, and drinking plenty of water will help. The use of this medicine may increase the chance of suicidal thoughts or actions. Pay special attention to how you are responding while on this medicine. Any worsening of mood, or thoughts of suicide or dying should be reported to your health care professional right away. Women who become pregnant while using  this medicine may enroll in the [place] [employer] Antiepileptic Drug Pregnancy Registry by calling [phone]. This registry collects information about the safety of antiepileptic drug use during pregnancy. What side effects may I notice from receiving this medicine? Side effects that you should report to your doctor or health care professional as soon as possible: -allergic reactions like skin rash, itching or hives, swelling of the face, lips, or tongue -worsening of mood, thoughts or actions of suicide or dying Side effects that usually do not require medical attention (report to your doctor or health care professional if they continue or are bothersome): -constipation -difficulty walking or controlling muscle movements -nausea -slurred speech -tremors -weight gain This list may not describe all possible side effects. Call your doctor for medical advice about side effects. You may report side effects to FDA at 1-800-FDA-1088. Where should I keep my medicine? Keep out of reach of children. Store at room temperature between 15 and 30 degrees C (59 and 86 degrees F). Throw away any unused medicine after the expiration date. NOTE: This sheet is a summary. It may not cover all possible information. If you have questions about this medicine, talk to your doctor, pharmacist, or health care provider.  2012, [employer]. (04/10/2010 6:06:26 PM)

## 2013-04-07 NOTE — Telephone Encounter (Signed)
Per staff message and POF I have scheduled appts.  JMW  

## 2013-04-07 NOTE — Patient Instructions (Signed)
 [facility] Discharge Instructions for Patients Receiving Chemotherapy  Today you received the following chemotherapy agents TAXOL,CARBOPLATIN To help prevent nausea and vomiting after your treatment, we encourage you to take your nausea medication Zofran and Compazine 10 mg one every 6 hours as needed for nausea   If you develop nausea and vomiting that is not controlled by your nausea medication, call the clinic.   BELOW ARE SYMPTOMS THAT SHOULD BE REPORTED IMMEDIATELY:  *FEVER GREATER THAN 100.5 F  *CHILLS WITH OR WITHOUT FEVER  NAUSEA AND VOMITING THAT IS NOT CONTROLLED WITH YOUR NAUSEA MEDICATION  *UNUSUAL SHORTNESS OF BREATH  *UNUSUAL BRUISING OR BLEEDING  TENDERNESS IN MOUTH AND THROAT WITH OR WITHOUT PRESENCE OF ULCERS  *URINARY PROBLEMS  *BOWEL PROBLEMS  UNUSUAL RASH Items with * indicate a potential emergency and should be followed up as soon as possible.  Feel free to call the clinic you have any questions or concerns. The clinic phone number is [phone].

## 2013-04-07 NOTE — Progress Notes (Signed)
OFFICE PROGRESS NOTE  CC  Amy Manns, MD [address] [place] [address], [place] [address] Dr. Claud Kelp  Dr. Antony Blackbird  DIAGNOSIS: 52 year old female with new diagnosis of bilateral breast cancers.   STAGE:  Cancer of upper-outer quadrant of female breast  Primary site: Breast (Bilateral)  Staging method: AJCC 7th Edition  Clinical: Stage IA (T1c, N0, cM0)  Summary: Stage IA (T1c, N0, cM0)   PRIOR THERAPY: #1Patient recently underwent screening mammograms and she was found to have bilateral breast abnormalities.diagnostic mammogram showed in the right breast an ill-defined 2 cm mass with a few associated well defined microcalcifications over the upper outer quadrant. Spot compression images of the left breast demonstrated an ill defined spiculated 8 mm mass centrally. Ultrasound showed irregular bordered heterogeneous hypoechoic solid mass at the 10:00 position of the right breast 6 cm from the nipple measuring 1.1 x 1.3 x 1.4 cm. Also in this location was an adjacent smaller irregular hypoechoic mass measuring 3 x 5 x 6 mm. The smaller mass was 1 cm from the large more irregular mass. Ultrasound of the axilla demonstrated single lymph node with thickened cortex. Ultrasound of the left breast demonstrated an ill-defined hypoechoic mass with distal acoustic shadowing 7 to 8:00 position 3 cm from the nipple located deep and measuring 5 x 8 x 8 mm ultrasound of the left axilla showed normal appearing lymph nodes.   #2Patient went on to have needle core biopsies performed of both of the masses. 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 The tumor was ER positive PR positive with a Ki-67 of 9% the second tumor was ER negative PR negative HER-2/neu negative with Ki-6792% and elevated.  #3 Patient had MRIs of the breasts performed. There was  noted to be 1.7 cm irregular enhancing mass located within the upper-outer quadrant of the right breast with 3 adjacent worrisome enhancing satellite nodules in aggregate the nodules and mass measures 3.8 cm. In the left breast 8 mm irregular enhancing mass located within the central left breast was noted. No evidence of axillary or internal mammary adenopathy.  #4patient is status post bilateral lumpectomies. The right breast revealed a 1.5 cm grade 3 invasive ductal carcinoma ER/PR negative HER-2/neu negative Ki-67 92%. Sentinel node was negative. Left breast showed 1.0 cm grade 3 invasive ductal carcinoma ER/PR positive HER-2/neu negative sentinel node was negative.  #5 patient is to begin adjuvant chemotherapy consisting of Adriamycin Cytoxan every 2 weeks for a total of 4 cycles 12/30/12. This would then be followed by Taxol and carboplatinum weekly for 12 weeks. Patient will need adjuvant radiation therapy. Because one of the tumor is ER positive she will also receive antiestrogen therapy.  CURRENT THERAPY: Taxol Carbo week 5  INTERVAL HISTORY: Amy Leonard 52 y.o. female returns for followup visit prior to her weekly Taxol/Carbo.  She is not feeling well today.  She is progressively fatigued.  She continues to have mild pedal edema that she elevates.  She reports she is watching her sodium intake.  She continues to use visine for her eyes and washes her lashes and lids BID with baby shampoo.  She is drinking fluids without difficulty.  She has blurred vision that comes and goes, she denies any eye pain, tenderness, discharge, gritty feeling, or foreign object feeling in her eye.  Otherwise, a 10 point ROS is  negative.    MEDICAL HISTORY: Past Medical History  Diagnosis Date  . Elevated WBC count     nl diff  . Chronic back pain     spinal stenosis  . Tobacco abuse   . Anxiety and depression   . HSV infection     recurrent (side and buttox)  . Migraine   . Glaucoma     ?  . Carpal  tunnel syndrome   . HLD (hyperlipidemia)   . Folliculitis 05/05/2011  . Anxiety   . Depression   . 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 GERD (gastroesophageal reflux disease)   . Hypertension   . Breast cancer   . Wears dentures     top    ALLERGIES:  is allergic to bupropion hcl; chlorhexidine gluconate; hydrocod polst-cpm polst er; lisinopril; and naproxen sodium.  MEDICATIONS:  Current Outpatient Prescriptions  Medication Sig Dispense Refill  . Acetaminophen (TYLENOL ARTHRITIS PAIN PO) Take 2 tablets by mouth as needed (pain).       Marland Kitchen aspirin 81 MG chewable tablet Chew 81 mg by mouth daily.      . ATIVAN 0.5 MG tablet Take 1 tablet (0.5 mg total) by mouth every 6 (six) hours as needed (Nausea or vomiting).  45 tablet  0  . atorvastatin (LIPITOR) 10 MG tablet TAKE 1 TABLET (10 MG TOTAL) BY MOUTH DAILY.  90 tablet  0  . calcium carbonate (TUMS - DOSED IN MG ELEMENTAL CALCIUM) 500 MG chewable tablet Chew by mouth as directed.        . ciprofloxacin (CIPRO) 500 MG tablet Take 1 tablet by mouth 2 (two) times daily.      . COMPRO 25 MG suppository       . cyclobenzaprine (FLEXERIL) 10 MG tablet Take 10 mg by mouth as needed (muscle spasm).       Marland Kitchen dexamethasone (DECADRON) 4 MG tablet Take 1 tablet (4 mg total) by mouth as directed. 2 tablets PO the day after chemo then 2 tablets PO BID x 2 days.  30 tablet  4  . fluconazole (DIFLUCAN) 100 MG tablet Take 2 tablets (200 mg total) by mouth daily.  14 tablet  0  . KLOR-CON M20 20 MEQ tablet Take 1 tablet by mouth daily.      Marland Kitchen lidocaine-prilocaine (EMLA) cream Apply topically as needed.  30 g  8  . losartan (COZAAR) 50 MG tablet Take 1 tablet (50 mg total) by mouth daily.  90 tablet  2  . nicotine (NICODERM CQ - DOSED IN MG/24 HOURS) 21 mg/24hr patch Place 1 patch onto the skin daily.  28 patch  0  . nystatin (MYCOSTATIN) 100000 UNIT/ML suspension Take 5 mLs (500,000 Units total) by mouth 2 (two) times daily.  240 mL  3  . omeprazole  (PRILOSEC) 40 MG capsule Take 1 capsule (40 mg total) by mouth 2 (two) times daily.  180 capsule  3  . ondansetron (ZOFRAN) 8 MG tablet       . prochlorperazine (COMPAZINE) 10 MG tablet       . prochlorperazine (COMPAZINE) 25 MG suppository Place 1 suppository (25 mg total) rectally every 12 (twelve) hours as needed for nausea.  12 suppository  3  . simethicone (MYLICON) 80 MG chewable tablet Chew 1 tablet (80 mg total) by mouth every 8 (eight) hours as needed for flatulence.  60 tablet  3  . acyclovir ointment (ZOVIRAX) 5 % Apply 1 application topically as needed (flare-up).  15 g  1  . gabapentin (NEURONTIN) 100 MG capsule Take 1 capsule (100 mg total) by mouth 3 (three) times daily.  90 capsule  0   No current facility-administered medications for this visit.   Facility-Administered Medications Ordered in Other Visits  Medication Dose Route Frequency Provider Last Rate Last Dose  . CARBOplatin (PARAPLATIN) 300 mg in sodium chloride 0.9 % 100 mL chemo infusion  300 mg Intravenous Once Victorino December, MD      . heparin lock flush 100 unit/mL  500 Units Intracatheter Once PRN Victorino December, MD      . PACLitaxel (TAXOL) 156 mg in dextrose 5 % 250 mL chemo infusion (</= 80mg /m2)  80 mg/m2 (Treatment Plan Actual) Intravenous Once Victorino December, MD 276 mL/hr at 04/07/13 1127 156 mg at 04/07/13 1127  . sodium chloride 0.9 % injection 10 mL  10 mL Intracatheter PRN Victorino December, MD        SURGICAL HISTORY:  Past Surgical History  Procedure Laterality Date  . Btl    . Epidural steroid injection  2008  . Colonoscopy    . Upper gi endoscopy    . Partial mastectomy with needle localization and axillary sentinel lymph node bx Bilateral 11/23/2012    Procedure: PARTIAL MASTECTOMY WITH NEEDLE LOCALIZATION AND AXILLARY SENTINEL LYMPH NODE BX;  Surgeon: Ernestene Mention, MD;  Location: [facility];  Service: General;  Laterality: Bilateral;  . Portacath placement Right 11/23/2012     Procedure: INSERTION PORT-A-CATH;  Surgeon: Ernestene Mention, MD;  Location: [facility];  Service: General;  Laterality: Right;    REVIEW OF SYSTEMS:  Pertinent items are noted in HPI.   PHYSICAL EXAMINATION: Blood pressure 149/86, pulse 101, temperature 98.3 F (36.8 C), temperature source Oral, resp. rate 20, height 5\' 3"  (1.6 m), weight 192 lb 1.6 oz (87.136 kg). Body mass index is 34.04 kg/(m^2). General: Patient is a well appearing female in no acute distress HEENT: PERRLA, no scleral injection, crusting, or puffiness of eyes noted.  sclerae anicteric no conjunctival pallor, MMM Neck: supple, no palpable adenopathy Lungs: clear to auscultation bilaterally, no wheezes, rhonchi, or rales Cardiovascular: regular rhythm, S1, S2, no murmurs, rubs or gallops Abdomen: Soft, non-tender, non-distended, normoactive bowel sounds, no HSM Extremities: warm and well perfused, no clubbing, cyanosis, or edema, entire soles of feet are not erythematous no skin breakdown noted, well moisturizied.  Her fingertips have no noted abnormality Skin: No rashes or lesions Neuro: Non-focal Breasts: Bilateral lumpectomy sites without nodularity, no masses or skin changes in either breast. ECOG PERFORMANCE STATUS: 0 - Asymptomatic  LABORATORY DATA: Lab Results  Component Value Date   WBC 4.9 04/07/2013   HGB 11.4* 04/07/2013   HCT 33.5* 04/07/2013   MCV 100.6 04/07/2013   PLT 230 04/07/2013      Chemistry      Component Value Date/Time   NA 141 04/07/2013 0844   NA 135 01/08/2013 0555   K 3.6 04/07/2013 0844   K 4.0 01/08/2013 0555   CL 109* 02/17/2013 1120   CL 100 01/08/2013 0555   CO2 24 04/07/2013 0844   CO2 28 01/08/2013 0555   BUN 12.2 04/07/2013 0844   BUN 10 01/08/2013 0555   CREATININE 0.6 04/07/2013 0844   CREATININE 0.62 01/08/2013 0555      Component Value Date/Time   CALCIUM 8.3* 04/07/2013 0844   CALCIUM 9.1 01/08/2013 0555   ALKPHOS 46 04/07/2013 0844   ALKPHOS 67 01/05/2013 2245  AST 18 04/07/2013 0844   AST 10 01/05/2013 2245   ALT 32 04/07/2013 0844   ALT 15 01/05/2013 2245   BILITOT 0.45 04/07/2013 0844   BILITOT 0.5 01/05/2013 2245    ADDITIONAL INFORMATION: 1. CHROMOGENIC IN-SITU HYBRIDIZATION Results: HER-2/NEU BY CISH - NO AMPLIFICATION OF HER-2 DETECTED. RESULT RATIO OF HER2: CEP 17 SIGNALS 1.00 AVERAGE HER2 COPY NUMBER PER CELL 1.55 REFERENCE RANGE NEGATIVE HER2/Chr17 Ratio <2.0 and Average HER2 copy number <4.0 EQUIVOCAL HER2/Chr17 Ratio <2.0 and Average HER2 copy number 4.0 and <6.0 POSITIVE HER2/Chr17 Ratio >=2.0 and/or Average HER2 copy number >=6.0 Pecola Leisure MD Pathologist, Electronic Signature ( Signed 12/01/2012) 3. CHROMOGENIC IN-SITU HYBRIDIZATION Results: HER-2/NEU BY CISH - NO AMPLIFICATION OF HER-2 DETECTED. RESULT RATIO OF HER2: CEP 17 SIGNALS 1.22 AVERAGE HER2 COPY NUMBER PER CELL 1.40 REFERENCE RANGE NEGATIVE HER2/Chr17 Ratio <2.0 and Average HER2 copy number <4.0 EQUIVOCAL HER2/Chr17 Ratio <2.0 and Average HER2 copy number 4.0 and <6.0 POSITIVE HER2/Chr17 Ratio >=2.0 and/or Average HER2 copy number >=6.0 1 of 5 FINAL for Amy, Leonard ([id number]) ADDITIONAL INFORMATION:(continued) Pecola Leisure MD Pathologist, Electronic Signature ( Signed 12/01/2012) FINAL DIAGNOSIS Diagnosis 1. Breast, lumpectomy, Right - INVASIVE DUCTAL CARCINOMA, GRADE III, SEE COMMENT. - INVASIVE CARCINOMA IS 0.9 CM FROM NEAREST MARGIN (INFERIOR). - NO LYMPHOVASCULAR INVASION IDENTIFIED. - HIGH GRADE DUCTAL CARCINOMA IN SITU - IN SITU CARCINOMA IS 1 MM FROM THE NEAREST LATERAL MARGIN - SEE TUMOR SYNOPTIC TEMPLATE BELOW. 2. Lymph nodes, regional resection, Right axilla - ELEVEN LYMPH NODES, NEGATIVE FOR TUMOR (0/11). 3. Breast, lumpectomy, Left - INVASIVE DUCTAL CARCINOMA, GRADE I, SEE COMMENT. - LYMPHOVASCULAR INVASION IDENTIFIED - INVASIVE CARCINOMA IS BROADLY PRESENT AT POSTERIOR MARGIN - DUCTAL CARCINOMA IN SITU, GRADE I. - IN SITU  CARCINOMA IS FOCALLY PRESENT AT POSTERIOR MARGIN - SEE TUMOR SYNOPTIC TEMPLATE BELOW. 4. Breast, excision, Left - BENIGN BREAST TISSUE, SEE COMMENT. - NEGATIVE FOR ATYPIA OR MALIGNANCY. - SURGICAL MARGIN, NEGATIVE FOR ATYPIA OR MALIGNANCY. 5. Lymph node, sentinel, biopsy, Left axillary - ONE LYMPH NODE, POSITIVE FOR METASTATIC MAMMARY CARCINOMA (1/1). Microscopic Comment 1. BREAST, INVASIVE TUMOR, WITH LYMPH NODE SAMPLING Specimen, including laterality: Right breast Procedure: Lumpectomy Grade: III of III Tubule formation: 3 Nuclear pleomorphism: 3 Mitotic: 3 Tumor size (gross measurement: 1.5 cm Margins: Invasive, distance to closest margin: 0.9 cm In-situ, distance to closest margin: 0.1 cm If margin positive, focally or broadly: N/A Lymphovascular invasion: Absent Ductal carcinoma in situ: Present Grade: III of III Extensive intraductal component: Absent Lobular neoplasia: Absent Tumor focality: Multifocal Treatment effect: None 2 of 5 FINAL for Amy, Leonard ([id number]) Microscopic Comment(continued) If present, treatment effect in breast tissue, lymph nodes or both: N/A Extent of tumor: Skin: N/A Nipple: N/A Skeletal muscle: N/A Lymph nodes: # examined: 11 (Part 2) Lymph nodes with metastasis: 0 Breast prognostic profile: Estrogen receptor: Repeated, previous study demonstrated 0% positivity ([id number]) Progesterone receptor: Repeated, previous study demonstrated 0% positivity ([id number]) Her 2 neu: Repeated, previous study demonstrated no amplification (1.38) ([id number]) Ki-67: Not repeated, previous study demonstrated 92% proliferation rate ([id number]) Non-neoplastic breast: Previous biopsy site, fibrocystic change, sclerosing adenosis and benign calcifications in benign ducts and lobules. TNM: pT1c, pN0, pMX Comments: There are a total of four distinct areas of abnormality identified. The first 1.5 cm corresponds to the invasive ductal carcinoma. The  second 0.9 cm area consists of high grade ductal carcinoma in situ with associated fibrocystic change and fibroadenomatoid nodules. In this focus, the in situ carcinoma is 1 mm from the nearest  lateral margin. The third 0.4 cm area consists of fat necrosis and fibrosis. There are no atypical or malignant findings in this area. The fourth and final 0.3 cm area demonstrate non-neoplastic findings to include fibrocystic change. There are no atypical or malignant epithelial findings in this area either. 3. BREAST, INVASIVE TUMOR, WITH LYMPH NODE SAMPLING Specimen, including laterality: Left breast Procedure: Lumpectomy Grade: I of III Tubule formation: 3 Nuclear pleomorphism: 3 Mitotic: 1 Tumor size (gross measurement): 1.0 cm Margins: Invasive, distance to closest margin: Present at posterior In-situ, distance to closest margin: Present at posterior If margin positive, focally or broadly: Broadly (invasive) and focally (in situ) Lymphovascular invasion: Present Ductal carcinoma in situ: Present Grade: I of III Extensive intraductal component: Absent Lobular neoplasia: Absent Tumor focality: Unifocal Treatment effect: None If present, treatment effect in breast tissue, lymph nodes or both: N/A Extent of tumor: Skin: N/A Nipple: N/A Skeletal muscle: N/A Lymph nodes: # examined: 1 (Part 5) Lymph nodes with metastasis: 1 Isolated tumor cells (< 0.2 mm): 0 Micrometastasis: (> 0.2 mm and < 2.0 mm): 1 Macrometastasis: (> 2.0 mm): 0 3 of 5 FINAL for Amy, Leonard ([id number]) Microscopic Comment(continued) Extracapsular extension: Present Breast prognostic profile: Estrogen receptor: Not repeated, previous study demonstrated 100% positivity ([id number]). Progesterone receptor: Not repeated, previous study demonstrated 25% positivity ([id number]) Her 2 neu: Repeated, previous study demonstrated no amplification (1.42) ([id number]) Ki-67: Not repeated, previous study demonstrated  9% proliferation rate ([id number]) Non-neoplastic breast: Previous biopsy site, fibrocystic change and microcalcifications in benign ducts and lobules. TNM: pT1b, pN39mi, PMX Comments: The additional 1.0 cm and 1.1 cm hemorrhagic areas grossly identified consist of benign breast tissue with microcalcifications and parenchymal hemorrhage. There are no atypical or malignant findings in these areas. 4. The surgical resection margin(s) of the specimen were inked and microscopically evaluated. There is no mass grossly identified. Representative sections demonstrate predominantly benign fibrovascular and adipose tissue with focal areas of fibrocytic change and sclerosing adenosis. There are no atypical or malignant epithelial or stromal findings identified. 5. There are microscopic foci of intranodal metastatic mammary tumor deposits spanning up to 1 mm. There is focal extracapsular extension identified. (CR:kh 11-25-12) [place] RUND DO Pathologist, Electronic Signature (Case signed 11/25/2012) Specimen Gross and Clinical Information   RADIOGRAPHIC STUDIES:  Chest 2 View  11/19/2012  *RADIOLOGY REPORT*  Clinical Data: Preop for breast cancer  CHEST - 2 VIEW  Comparison: None.  Findings: Cardiomediastinal silhouette is unremarkable.  No acute infiltrate or pleural effusion.  No pulmonary edema.  Bony thorax is unremarkable.  IMPRESSION: No active disease.   Original Report Authenticated By: Natasha Mead, M.D.    Nm Sentinel Node Inj-no Rpt (breast)  11/23/2012  CLINICAL DATA: Bilateral breast cancer   Sulfur colloid was injected intradermally by the nuclear medicine  technologist for breast cancer sentinel node localization.     Dg Chest Portable 1 View  11/23/2012  *RADIOLOGY REPORT*  Clinical Data: 52 year old female status post Port-A-Cath placement.  Breast cancer.  PORTABLE CHEST - 1 VIEW  Comparison: 11/19/2012.  Findings: Portable semi upright AP view 1441 hours.  Right chest subclavian approach  Port-A-Cath placed.  Acute angulation of the catheter tubing at the level of the right axilla.  Tip at the level of the cavoatrial junction.  Interval postoperative changes to the right chest wall with subcutaneous appearing drain in place.  No pneumothorax.  No pleural effusion or pulmonary edema.  Mildly increased bibasilar opacity compatible with atelectasis.  Cardiac size and mediastinal contours  are within normal limits.  Visualized tracheal air column is within normal limits.  IMPRESSION: 1.  Right subclavian approach chest Port-A-Cath in place.  Tip at the level of the cavoatrial junction.  Mildly angulated catheter tubing at the level of the axilla. 2.  Interval postoperative changes to the right chest wall. No pneumothorax. 3.  Mild dependent atelectasis.   Original Report Authenticated By: Erskine Speed, M.D.    Dg Fluoro Guide Cv Line-no Report  11/23/2012  CLINICAL DATA: bil breast cancer   FLOURO GUIDE CV LINE  Fluoroscopy was utilized by the requesting physician.  No radiographic  interpretation.     Mm Lt Plc Breast Loc Dev   1st Lesion  Inc Mammo Guide  11/23/2012  *RADIOLOGY REPORT*  Clinical Data:  Left breast cancer  NEEDLE LOCALIZATION WITH MAMMOGRAPHIC GUIDANCE AND SPECIMEN RADIOGRAPH  Comparison:  Previous exams.  Patient presents for needle localization prior to surgery.  I met with the patient and we discussed the procedure of needle localization including benefits and alternatives. We discussed the high likelihood of a successful procedure. We discussed the risks of the procedure, including infection, bleeding, tissue injury, and further surgery. Informed, written consent was given.  Using mammographic guidance, sterile technique, 2% lidocaine and a #9 modified Kopans needle, the mass and clip in the central aspect of the left breast was localized using a superior approach.  The films are marked for Dr. Derrell Lolling.  Specimen radiograph was performed at Day Surgery, and confirms the mass and  clip were present in the tissue sample. The clip was located at the margin of the surgical specimen.  Dr.  Derrell Lolling states she had taken more tissue at the margin to clear the border.  The specimen is marked for pathology.  IMPRESSION: Needle localization of the left breast.  No apparent complications.   Original Report Authenticated By: Baird Lyons, M.D.    Mm Rt Plc Breast Loc Dev   1st Lesion  Inc Mammo Guide  11/23/2012  *RADIOLOGY REPORT*  Clinical Data:  Known right breast cancer  NEEDLE LOCALIZATION WITH MAMMOGRAPHIC GUIDANCE AND SPECIMEN RADIOGRAPH  Comparison:  Previous exams.  Patient presents for needle localization prior to surgery.  I met with the patient and we discussed the procedure of needle localization including benefits and alternatives. We discussed the high likelihood of a successful procedure. We discussed the risks of the procedure, including infection, bleeding, tissue injury, and further surgery. Informed, written consent was given.  Using mammographic guidance, sterile technique, 2% lidocaine and a #7 and #9 modified Kopans needles, the mass and nodularity in the upper outer quadrant of the right breast was bracketedusing a lateral approach.  The films are marked for Dr. Derrell Lolling.  Specimen radiograph was performed at Day Surgery, and confirms the mass and clip are present in the tissue sample.  The specimen is marked for pathology.  IMPRESSION: Needle localization of the right breast.  No apparent complications.   Original Report Authenticated By: Baird Lyons, M.D.     ASSESSMENT: 52 year old female with  #1 bilateral breast cancers triple negative on the right side ER positive on the left side. She is status post bilateral lumpectomies. Clinically she's doing well. She will receive Adriamycin Cytoxan given dose dense x4 cycles followed by Taxol carbo x12 cycles.  Rationale risks benefits and side effects were discussed.  Currently Taxol carbo week 5.   #2 patient has had an  echocardiogram Port-A-Cath placed and chemotherapy teaching class.  #3 Blepharitis  PLAN:   #1 Doing well.  Patient will proceed with chemotherapy today, Taxol/Carbo week 5.  We discussed limiting her sodium intake again. I prescribed a lymphedema sleeve today.    2. She will follow up with [place] in one week for her next treatment.    3.  She will continue with Claritin.  She has not needed the baby soap or lid washing this week.  She wants to see the ophthalmologist for a detailed eye exam, I encouraged her to wait until after treatment.     All questions were answered. The patient knows to call the clinic with any problems, questions or concerns. We can certainly see the patient much sooner if necessary.  I spent 25 minutes counseling the patient face to face. The total time spent in the appointment was 30 minutes.  Cherie Ouch Lyn Hollingshead, NP Medical Oncology [facility] Phone: [phone] 04/07/2013, 11:58 AM

## 2013-04-07 NOTE — Telephone Encounter (Signed)
Printed pt and avs. Pt is aware that tx will be added for 8/20, 8/27, 9/3, and 9/10. i emailed MW to add the tx...td

## 2013-04-14 ENCOUNTER — Encounter: Payer: Self-pay | Admitting: Adult Health

## 2013-04-14 ENCOUNTER — Ambulatory Visit (HOSPITAL_BASED_OUTPATIENT_CLINIC_OR_DEPARTMENT_OTHER): Payer: BC Managed Care – PPO | Admitting: Adult Health

## 2013-04-14 ENCOUNTER — Ambulatory Visit (HOSPITAL_BASED_OUTPATIENT_CLINIC_OR_DEPARTMENT_OTHER): Payer: BC Managed Care – PPO

## 2013-04-14 ENCOUNTER — Other Ambulatory Visit (HOSPITAL_BASED_OUTPATIENT_CLINIC_OR_DEPARTMENT_OTHER): Payer: BC Managed Care – PPO | Admitting: Lab

## 2013-04-14 VITALS — BP 129/84 | HR 120 | Temp 98.6°F | Resp 18 | Ht 63.0 in | Wt 188.9 lb

## 2013-04-14 DIAGNOSIS — C50919 Malignant neoplasm of unspecified site of unspecified female breast: Secondary | ICD-10-CM

## 2013-04-14 DIAGNOSIS — C50419 Malignant neoplasm of upper-outer quadrant of unspecified female breast: Secondary | ICD-10-CM

## 2013-04-14 DIAGNOSIS — R209 Unspecified disturbances of skin sensation: Secondary | ICD-10-CM

## 2013-04-14 DIAGNOSIS — H109 Unspecified conjunctivitis: Secondary | ICD-10-CM

## 2013-04-14 DIAGNOSIS — H01009 Unspecified blepharitis unspecified eye, unspecified eyelid: Secondary | ICD-10-CM

## 2013-04-14 DIAGNOSIS — Z170 Estrogen receptor positive status [ER+]: Secondary | ICD-10-CM

## 2013-04-14 DIAGNOSIS — C50411 Malignant neoplasm of upper-outer quadrant of right female breast: Secondary | ICD-10-CM

## 2013-04-14 DIAGNOSIS — Z5111 Encounter for antineoplastic chemotherapy: Secondary | ICD-10-CM

## 2013-04-14 DIAGNOSIS — C50912 Malignant neoplasm of unspecified site of left female breast: Secondary | ICD-10-CM

## 2013-04-14 LAB — CBC WITH DIFFERENTIAL/PLATELET
Basophils Absolute: 0 10*3/uL (ref 0.0–0.1)
EOS%: 0.2 % (ref 0.0–7.0)
HCT: 34.1 % — ABNORMAL LOW (ref 34.8–46.6)
HGB: 11.5 g/dL — ABNORMAL LOW (ref 11.6–15.9)
LYMPH%: 15.3 % (ref 14.0–49.7)
MCH: 34.5 pg — ABNORMAL HIGH (ref 25.1–34.0)
MCV: 102.4 fL — ABNORMAL HIGH (ref 79.5–101.0)
MONO%: 9.7 % (ref 0.0–14.0)
NEUT%: 74.6 % (ref 38.4–76.8)

## 2013-04-14 LAB — COMPREHENSIVE METABOLIC PANEL (CC13)
Albumin: 3.1 g/dL — ABNORMAL LOW (ref 3.5–5.0)
Alkaline Phosphatase: 47 U/L (ref 40–150)
BUN: 10.9 mg/dL (ref 7.0–26.0)
Creatinine: 0.6 mg/dL (ref 0.6–1.1)
Glucose: 120 mg/dl (ref 70–140)
Total Bilirubin: 0.39 mg/dL (ref 0.20–1.20)

## 2013-04-14 MED ORDER — PACLITAXEL CHEMO INJECTION 300 MG/50ML
80.0000 mg/m2 | Freq: Once | INTRAVENOUS | Status: AC
  Administered 2013-04-14: 156 mg via INTRAVENOUS
  Filled 2013-04-14: qty 26

## 2013-04-14 MED ORDER — TOBRAMYCIN-DEXAMETHASONE 0.3-0.1 % OP SUSP: 1.0000 [drp] | OPHTHALMIC | Status: DC

## 2013-04-14 MED ORDER — ACETAMINOPHEN 325 MG PO TABS
650.0000 mg | ORAL_TABLET | Freq: Once | ORAL | Status: AC
  Administered 2013-04-14: 650 mg via ORAL

## 2013-04-14 MED ORDER — SODIUM CHLORIDE 0.9 % IV SOLN
300.0000 mg | Freq: Once | INTRAVENOUS | Status: AC
  Administered 2013-04-14: 300 mg via INTRAVENOUS
  Filled 2013-04-14: qty 30

## 2013-04-14 MED ORDER — FAMOTIDINE IN NACL 20-0.9 MG/50ML-% IV SOLN
20.0000 mg | Freq: Once | INTRAVENOUS | Status: AC
  Administered 2013-04-14: 20 mg via INTRAVENOUS

## 2013-04-14 MED ORDER — ONDANSETRON 16 MG/50ML IVPB (CHCC)
16.0000 mg | Freq: Once | INTRAVENOUS | Status: AC
  Administered 2013-04-14: 16 mg via INTRAVENOUS

## 2013-04-14 MED ORDER — DEXAMETHASONE SODIUM PHOSPHATE 20 MG/5ML IJ SOLN
20.0000 mg | Freq: Once | INTRAMUSCULAR | Status: AC
  Administered 2013-04-14: 20 mg via INTRAVENOUS

## 2013-04-14 MED ORDER — SODIUM CHLORIDE 0.9 % IJ SOLN
10.0000 mL | INTRAMUSCULAR | Status: DC | PRN
  Administered 2013-04-14: 10 mL
  Filled 2013-04-14: qty 10

## 2013-04-14 MED ORDER — DIPHENHYDRAMINE HCL 50 MG/ML IJ SOLN
12.5000 mg | Freq: Once | INTRAMUSCULAR | Status: AC
  Administered 2013-04-14: 12.5 mg via INTRAVENOUS

## 2013-04-14 MED ORDER — HEPARIN SOD (PORK) LOCK FLUSH 100 UNIT/ML IV SOLN
500.0000 [IU] | Freq: Once | INTRAVENOUS | Status: AC | PRN
  Administered 2013-04-14: 500 [IU]
  Filled 2013-04-14: qty 5

## 2013-04-14 MED ORDER — SODIUM CHLORIDE 0.9 % IV SOLN
Freq: Once | INTRAVENOUS | Status: AC
  Administered 2013-04-14: 12:00:00 via INTRAVENOUS

## 2013-04-14 NOTE — Progress Notes (Signed)
OFFICE PROGRESS NOTE  CC  [name], MD [address] [place] [address], [place] [address] Dr. Claud Kelp  Dr. Antony Blackbird  DIAGNOSIS: 52 year old female with new diagnosis of bilateral breast cancers.   STAGE:  Cancer of upper-outer quadrant of female breast  Primary site: Breast (Bilateral)  Staging method: AJCC 7th Edition  Clinical: Stage IA (T1c, N0, cM0)  Summary: Stage IA (T1c, N0, cM0)   PRIOR THERAPY: #1Patient recently underwent screening mammograms and she was found to have bilateral breast abnormalities.diagnostic mammogram showed in the right breast an ill-defined 2 cm mass with a few associated well defined microcalcifications over the upper outer quadrant. Spot compression images of the left breast demonstrated an ill defined spiculated 8 mm mass centrally. Ultrasound showed irregular bordered heterogeneous hypoechoic solid mass at the 10:00 position of the right breast 6 cm from the nipple measuring 1.1 x 1.3 x 1.4 cm. Also in this location was an adjacent smaller irregular hypoechoic mass measuring 3 x 5 x 6 mm. The smaller mass was 1 cm from the large more irregular mass. Ultrasound of the axilla demonstrated single lymph node with thickened cortex. Ultrasound of the left breast demonstrated an ill-defined hypoechoic mass with distal acoustic shadowing 7 to 8:00 position 3 cm from the nipple located deep and measuring 5 x 8 x 8 mm ultrasound of the left axilla showed normal appearing lymph nodes.   #2Patient went on to have needle core biopsies performed of both of the masses. 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 The tumor was ER positive PR positive with a Ki-67 of 9% the second tumor was ER negative PR negative HER-2/neu negative with Ki-6792% and elevated.  #3 Patient had MRIs of the breasts performed. There was  noted to be 1.7 cm irregular enhancing mass located within the upper-outer quadrant of the right breast with 3 adjacent worrisome enhancing satellite nodules in aggregate the nodules and mass measures 3.8 cm. In the left breast 8 mm irregular enhancing mass located within the central left breast was noted. No evidence of axillary or internal mammary adenopathy.  #4patient is status post bilateral lumpectomies. The right breast revealed a 1.5 cm grade 3 invasive ductal carcinoma ER/PR negative HER-2/neu negative Ki-67 92%. Sentinel node was negative. Left breast showed 1.0 cm grade 3 invasive ductal carcinoma ER/PR positive HER-2/neu negative sentinel node was negative.  #5 patient is to begin adjuvant chemotherapy consisting of Adriamycin Cytoxan every 2 weeks for a total of 4 cycles 12/30/12. This would then be followed by Taxol and carboplatinum weekly for 12 weeks. Patient will need adjuvant radiation therapy. Because one of the tumor is ER positive she will also receive antiestrogen therapy.  CURRENT THERAPY: Taxol Carbo week 6  INTERVAL HISTORY: Amy Leonard 52 y.o. female returns for followup visit prior to her weekly Taxol/Carbo.   She is doing relatively well today.  She reports her feet are red and hot, the skin is intact and not peeling.  Her eyes now have a gritty feeling and she has been rubbing them a lot lately.  She denies fevers, chills, nausea, vomiting, constipation, diarrhea.  She continues on gabapentin for the mild numbness in her toes.  She is tolerating it well and the numbness is stable.  Otherwise a 10 point ROS is negative.   MEDICAL HISTORY: Past Medical History  Diagnosis  Date  . Elevated WBC count     nl diff  . Chronic back pain     spinal stenosis  . Tobacco abuse   . Anxiety and depression   . HSV infection     recurrent (side and buttox)  . Migraine   . Glaucoma     ?  . Carpal tunnel syndrome   . HLD (hyperlipidemia)   . Folliculitis 05/05/2011  .  Anxiety   . Depression   . 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 GERD (gastroesophageal reflux disease)   . Hypertension   . Breast cancer   . Wears dentures     top    ALLERGIES:  is allergic to bupropion hcl; chlorhexidine gluconate; hydrocod polst-cpm polst er; lisinopril; and naproxen sodium.  MEDICATIONS:  Current Outpatient Prescriptions  Medication Sig Dispense Refill  . Acetaminophen (TYLENOL ARTHRITIS PAIN PO) Take 2 tablets by mouth as needed (pain).       Marland Kitchen acyclovir ointment (ZOVIRAX) 5 % Apply 1 application topically as needed (flare-up).  15 g  1  . aspirin 81 MG chewable tablet Chew 81 mg by mouth daily.      . ATIVAN 0.5 MG tablet Take 1 tablet (0.5 mg total) by mouth every 6 (six) hours as needed (Nausea or vomiting).  45 tablet  0  . atorvastatin (LIPITOR) 10 MG tablet TAKE 1 TABLET (10 MG TOTAL) BY MOUTH DAILY.  90 tablet  0  . dexamethasone (DECADRON) 4 MG tablet Take 1 tablet (4 mg total) by mouth as directed. 2 tablets PO the day after chemo then 2 tablets PO BID x 2 days.  30 tablet  4  . gabapentin (NEURONTIN) 100 MG capsule Take 1 capsule (100 mg total) by mouth 3 (three) times daily.  90 capsule  0  . lidocaine-prilocaine (EMLA) cream Apply topically as needed.  30 g  8  . losartan (COZAAR) 50 MG tablet Take 1 tablet (50 mg total) by mouth daily.  90 tablet  2  . nicotine (NICODERM CQ - DOSED IN MG/24 HOURS) 21 mg/24hr patch Place 1 patch onto the skin daily.  28 patch  0  . nystatin (MYCOSTATIN) 100000 UNIT/ML suspension Take 5 mLs (500,000 Units total) by mouth 2 (two) times daily.  240 mL  3  . omeprazole (PRILOSEC) 40 MG capsule Take 1 capsule (40 mg total) by mouth 2 (two) times daily.  180 capsule  3  . simethicone (MYLICON) 80 MG chewable tablet Chew 1 tablet (80 mg total) by mouth every 8 (eight) hours as needed for flatulence.  60 tablet  3  . calcium carbonate (TUMS - DOSED IN MG ELEMENTAL CALCIUM) 500 MG chewable tablet Chew by mouth as directed.         . ciprofloxacin (CIPRO) 500 MG tablet Take 1 tablet by mouth 2 (two) times daily.      . COMPRO 25 MG suppository       . cyclobenzaprine (FLEXERIL) 10 MG tablet Take 10 mg by mouth as needed (muscle spasm).       . fluconazole (DIFLUCAN) 100 MG tablet Take 2 tablets (200 mg total) by mouth daily.  14 tablet  0  . KLOR-CON M20 20 MEQ tablet Take 1 tablet by mouth daily.      . ondansetron (ZOFRAN) 8 MG tablet       . prochlorperazine (COMPAZINE) 10 MG tablet       . prochlorperazine (COMPAZINE) 25 MG suppository Place 1 suppository (25 mg  total) rectally every 12 (twelve) hours as needed for nausea.  12 suppository  3   No current facility-administered medications for this visit.    SURGICAL HISTORY:  Past Surgical History  Procedure Laterality Date  . Btl    . Epidural steroid injection  2008  . Colonoscopy    . Upper gi endoscopy    . Partial mastectomy with needle localization and axillary sentinel lymph node bx Bilateral 11/23/2012    Procedure: PARTIAL MASTECTOMY WITH NEEDLE LOCALIZATION AND AXILLARY SENTINEL LYMPH NODE BX;  Surgeon: Ernestene Mention, MD;  Location: [facility];  Service: General;  Laterality: Bilateral;  . Portacath placement Right 11/23/2012    Procedure: INSERTION PORT-A-CATH;  Surgeon: Ernestene Mention, MD;  Location:  SURGERY CENTER;  Service: General;  Laterality: Right;    REVIEW OF SYSTEMS:  Pertinent items are noted in HPI.   PHYSICAL EXAMINATION: Blood pressure 129/84, pulse 120, temperature 98.6 F (37 C), temperature source Oral, resp. rate 18, height 5\' 3"  (1.6 m), weight 188 lb 14.4 oz (85.684 kg). Body mass index is 33.47 kg/(m^2). General: Patient is a well appearing female in no acute distress HEENT: PERRLA, no scleral injection, crusting, or puffiness of eyes noted.  sclerae anicteric no conjunctival pallor, MMM Neck: supple, no palpable adenopathy Lungs: clear to auscultation bilaterally, no wheezes, rhonchi, or  rales Cardiovascular: regular rhythm, S1, S2, no murmurs, rubs or gallops Abdomen: Soft, non-tender, non-distended, normoactive bowel sounds, no HSM Extremities: warm and well perfused, no clubbing, cyanosis, or edema, entire soles of feet are not erythematous no skin breakdown noted, well moisturizied.  Her fingertips have no noted abnormality Skin: No rashes or lesions Neuro: Non-focal Breasts: Bilateral lumpectomy sites without nodularity, no masses or skin changes in either breast. ECOG PERFORMANCE STATUS: 0 - Asymptomatic  LABORATORY DATA: Lab Results  Component Value Date   WBC 4.4 04/14/2013   HGB 11.5* 04/14/2013   HCT 34.1* 04/14/2013   MCV 102.4* 04/14/2013   PLT 181 04/14/2013      Chemistry      Component Value Date/Time   NA 141 04/07/2013 0844   NA 135 01/08/2013 0555   K 3.6 04/07/2013 0844   K 4.0 01/08/2013 0555   CL 109* 02/17/2013 1120   CL 100 01/08/2013 0555   CO2 24 04/07/2013 0844   CO2 28 01/08/2013 0555   BUN 12.2 04/07/2013 0844   BUN 10 01/08/2013 0555   CREATININE 0.6 04/07/2013 0844   CREATININE 0.62 01/08/2013 0555      Component Value Date/Time   CALCIUM 8.3* 04/07/2013 0844   CALCIUM 9.1 01/08/2013 0555   ALKPHOS 46 04/07/2013 0844   ALKPHOS 67 01/05/2013 2245   AST 18 04/07/2013 0844   AST 10 01/05/2013 2245   ALT 32 04/07/2013 0844   ALT 15 01/05/2013 2245   BILITOT 0.45 04/07/2013 0844   BILITOT 0.5 01/05/2013 2245    ADDITIONAL INFORMATION: 1. CHROMOGENIC IN-SITU HYBRIDIZATION Results: HER-2/NEU BY CISH - NO AMPLIFICATION OF HER-2 DETECTED. RESULT RATIO OF HER2: CEP 17 SIGNALS 1.00 AVERAGE HER2 COPY NUMBER PER CELL 1.55 REFERENCE RANGE NEGATIVE HER2/Chr17 Ratio <2.0 and Average HER2 copy number <4.0 EQUIVOCAL HER2/Chr17 Ratio <2.0 and Average HER2 copy number 4.0 and <6.0 POSITIVE HER2/Chr17 Ratio >=2.0 and/or Average HER2 copy number >=6.0 Pecola Leisure MD Pathologist, Electronic Signature ( Signed 12/01/2012) 3. CHROMOGENIC IN-SITU  HYBRIDIZATION Results: HER-2/NEU BY CISH - NO AMPLIFICATION OF HER-2 DETECTED. RESULT RATIO OF HER2: CEP 17 SIGNALS 1.22 AVERAGE  HER2 COPY NUMBER PER CELL 1.40 REFERENCE RANGE NEGATIVE HER2/Chr17 Ratio <2.0 and Average HER2 copy number <4.0 EQUIVOCAL HER2/Chr17 Ratio <2.0 and Average HER2 copy number 4.0 and <6.0 POSITIVE HER2/Chr17 Ratio >=2.0 and/or Average HER2 copy number >=6.0 1 of 5 FINAL for LENA, GORES ([id number]) ADDITIONAL INFORMATION:(continued) Pecola Leisure MD Pathologist, Electronic Signature ( Signed 12/01/2012) FINAL DIAGNOSIS Diagnosis 1. Breast, lumpectomy, Right - INVASIVE DUCTAL CARCINOMA, GRADE III, SEE COMMENT. - INVASIVE CARCINOMA IS 0.9 CM FROM NEAREST MARGIN (INFERIOR). - NO LYMPHOVASCULAR INVASION IDENTIFIED. - HIGH GRADE DUCTAL CARCINOMA IN SITU - IN SITU CARCINOMA IS 1 MM FROM THE NEAREST LATERAL MARGIN - SEE TUMOR SYNOPTIC TEMPLATE BELOW. 2. Lymph nodes, regional resection, Right axilla - ELEVEN LYMPH NODES, NEGATIVE FOR TUMOR (0/11). 3. Breast, lumpectomy, Left - INVASIVE DUCTAL CARCINOMA, GRADE I, SEE COMMENT. - LYMPHOVASCULAR INVASION IDENTIFIED - INVASIVE CARCINOMA IS BROADLY PRESENT AT POSTERIOR MARGIN - DUCTAL CARCINOMA IN SITU, GRADE I. - IN SITU CARCINOMA IS FOCALLY PRESENT AT POSTERIOR MARGIN - SEE TUMOR SYNOPTIC TEMPLATE BELOW. 4. Breast, excision, Left - BENIGN BREAST TISSUE, SEE COMMENT. - NEGATIVE FOR ATYPIA OR MALIGNANCY. - SURGICAL MARGIN, NEGATIVE FOR ATYPIA OR MALIGNANCY. 5. Lymph node, sentinel, biopsy, Left axillary - ONE LYMPH NODE, POSITIVE FOR METASTATIC MAMMARY CARCINOMA (1/1). Microscopic Comment 1. BREAST, INVASIVE TUMOR, WITH LYMPH NODE SAMPLING Specimen, including laterality: Right breast Procedure: Lumpectomy Grade: III of III Tubule formation: 3 Nuclear pleomorphism: 3 Mitotic: 3 Tumor size (gross measurement: 1.5 cm Margins: Invasive, distance to closest margin: 0.9 cm In-situ, distance to closest  margin: 0.1 cm If margin positive, focally or broadly: N/A Lymphovascular invasion: Absent Ductal carcinoma in situ: Present Grade: III of III Extensive intraductal component: Absent Lobular neoplasia: Absent Tumor focality: Multifocal Treatment effect: None 2 of 5 FINAL for BEAUX, VERNE ([id number]) Microscopic Comment(continued) If present, treatment effect in breast tissue, lymph nodes or both: N/A Extent of tumor: Skin: N/A Nipple: N/A Skeletal muscle: N/A Lymph nodes: # examined: 11 (Part 2) Lymph nodes with metastasis: 0 Breast prognostic profile: Estrogen receptor: Repeated, previous study demonstrated 0% positivity ([id number]) Progesterone receptor: Repeated, previous study demonstrated 0% positivity ([id number]) Her 2 neu: Repeated, previous study demonstrated no amplification (1.38) ([id number]) Ki-67: Not repeated, previous study demonstrated 92% proliferation rate ([id number]) Non-neoplastic breast: Previous biopsy site, fibrocystic change, sclerosing adenosis and benign calcifications in benign ducts and lobules. TNM: pT1c, pN0, pMX Comments: There are a total of four distinct areas of abnormality identified. The first 1.5 cm corresponds to the invasive ductal carcinoma. The second 0.9 cm area consists of high grade ductal carcinoma in situ with associated fibrocystic change and fibroadenomatoid nodules. In this focus, the in situ carcinoma is 1 mm from the nearest lateral margin. The third 0.4 cm area consists of fat necrosis and fibrosis. There are no atypical or malignant findings in this area. The fourth and final 0.3 cm area demonstrate non-neoplastic findings to include fibrocystic change. There are no atypical or malignant epithelial findings in this area either. 3. BREAST, INVASIVE TUMOR, WITH LYMPH NODE SAMPLING Specimen, including laterality: Left breast Procedure: Lumpectomy Grade: I of III Tubule formation: 3 Nuclear pleomorphism: 3 Mitotic:  1 Tumor size (gross measurement): 1.0 cm Margins: Invasive, distance to closest margin: Present at posterior In-situ, distance to closest margin: Present at posterior If margin positive, focally or broadly: Broadly (invasive) and focally (in situ) Lymphovascular invasion: Present Ductal carcinoma in situ: Present Grade: I of III Extensive intraductal component: Absent Lobular neoplasia: Absent Tumor focality: Unifocal Treatment  effect: None If present, treatment effect in breast tissue, lymph nodes or both: N/A Extent of tumor: Skin: N/A Nipple: N/A Skeletal muscle: N/A Lymph nodes: # examined: 1 (Part 5) Lymph nodes with metastasis: 1 Isolated tumor cells (< 0.2 mm): 0 Micrometastasis: (> 0.2 mm and < 2.0 mm): 1 Macrometastasis: (> 2.0 mm): 0 3 of 5 FINAL for OPALINE, REYBURN ([id number]) Microscopic Comment(continued) Extracapsular extension: Present Breast prognostic profile: Estrogen receptor: Not repeated, previous study demonstrated 100% positivity ([id number]). Progesterone receptor: Not repeated, previous study demonstrated 25% positivity ([id number]) Her 2 neu: Repeated, previous study demonstrated no amplification (1.42) ([id number]) Ki-67: Not repeated, previous study demonstrated 9% proliferation rate ([id number]) Non-neoplastic breast: Previous biopsy site, fibrocystic change and microcalcifications in benign ducts and lobules. TNM: pT1b, pN59mi, PMX Comments: The additional 1.0 cm and 1.1 cm hemorrhagic areas grossly identified consist of benign breast tissue with microcalcifications and parenchymal hemorrhage. There are no atypical or malignant findings in these areas. 4. The surgical resection margin(s) of the specimen were inked and microscopically evaluated. There is no mass grossly identified. Representative sections demonstrate predominantly benign fibrovascular and adipose tissue with focal areas of fibrocytic change and sclerosing adenosis. There are no  atypical or malignant epithelial or stromal findings identified. 5. There are microscopic foci of intranodal metastatic mammary tumor deposits spanning up to 1 mm. There is focal extracapsular extension identified. (CR:kh 11-25-12) [place] RUND DO Pathologist, Electronic Signature (Case signed 11/25/2012) Specimen Gross and Clinical Information   RADIOGRAPHIC STUDIES:  Chest 2 View  11/19/2012  *RADIOLOGY REPORT*  Clinical Data: Preop for breast cancer  CHEST - 2 VIEW  Comparison: None.  Findings: Cardiomediastinal silhouette is unremarkable.  No acute infiltrate or pleural effusion.  No pulmonary edema.  Bony thorax is unremarkable.  IMPRESSION: No active disease.   Original Report Authenticated By: Natasha Mead, M.D.    Nm Sentinel Node Inj-no Rpt (breast)  11/23/2012  CLINICAL DATA: Bilateral breast cancer   Sulfur colloid was injected intradermally by the nuclear medicine  technologist for breast cancer sentinel node localization.     Dg Chest Portable 1 View  11/23/2012  *RADIOLOGY REPORT*  Clinical Data: 52 year old female status post Port-A-Cath placement.  Breast cancer.  PORTABLE CHEST - 1 VIEW  Comparison: 11/19/2012.  Findings: Portable semi upright AP view 1441 hours.  Right chest subclavian approach Port-A-Cath placed.  Acute angulation of the catheter tubing at the level of the right axilla.  Tip at the level of the cavoatrial junction.  Interval postoperative changes to the right chest wall with subcutaneous appearing drain in place.  No pneumothorax.  No pleural effusion or pulmonary edema.  Mildly increased bibasilar opacity compatible with atelectasis.  Cardiac size and mediastinal contours are within normal limits.  Visualized tracheal air column is within normal limits.  IMPRESSION: 1.  Right subclavian approach chest Port-A-Cath in place.  Tip at the level of the cavoatrial junction.  Mildly angulated catheter tubing at the level of the axilla. 2.  Interval postoperative changes to  the right chest wall. No pneumothorax. 3.  Mild dependent atelectasis.   Original Report Authenticated By: Erskine Speed, M.D.    Dg Fluoro Guide Cv Line-no Report  11/23/2012  CLINICAL DATA: bil breast cancer   FLOURO GUIDE CV LINE  Fluoroscopy was utilized by the requesting physician.  No radiographic  interpretation.     Mm Lt Plc Breast Loc Dev   1st Lesion  Inc Mammo Guide  11/23/2012  *RADIOLOGY REPORT*  Clinical Data:  Left breast cancer  NEEDLE LOCALIZATION WITH MAMMOGRAPHIC GUIDANCE AND SPECIMEN RADIOGRAPH  Comparison:  Previous exams.  Patient presents for needle localization prior to surgery.  I met with the patient and we discussed the procedure of needle localization including benefits and alternatives. We discussed the high likelihood of a successful procedure. We discussed the risks of the procedure, including infection, bleeding, tissue injury, and further surgery. Informed, written consent was given.  Using mammographic guidance, sterile technique, 2% lidocaine and a #9 modified Kopans needle, the mass and clip in the central aspect of the left breast was localized using a superior approach.  The films are marked for Dr. Derrell Lolling.  Specimen radiograph was performed at [facility], and confirms the mass and clip were present in the tissue sample. The clip was located at the margin of the surgical specimen.  Dr.  Derrell Lolling states she had taken more tissue at the margin to clear the border.  The specimen is marked for pathology.  IMPRESSION: Needle localization of the left breast.  No apparent complications.   Original Report Authenticated By: Baird Lyons, M.D.    Mm Rt Plc Breast Loc Dev   1st Lesion  Inc Mammo Guide  11/23/2012  *RADIOLOGY REPORT*  Clinical Data:  Known right breast cancer  NEEDLE LOCALIZATION WITH MAMMOGRAPHIC GUIDANCE AND SPECIMEN RADIOGRAPH  Comparison:  Previous exams.  Patient presents for needle localization prior to surgery.  I met with the patient and we discussed the  procedure of needle localization including benefits and alternatives. We discussed the high likelihood of a successful procedure. We discussed the risks of the procedure, including infection, bleeding, tissue injury, and further surgery. Informed, written consent was given.  Using mammographic guidance, sterile technique, 2% lidocaine and a #7 and #9 modified Kopans needles, the mass and nodularity in the upper outer quadrant of the right breast was bracketedusing a lateral approach.  The films are marked for Dr. Derrell Lolling.  Specimen radiograph was performed at Day Surgery, and confirms the mass and clip are present in the tissue sample.  The specimen is marked for pathology.  IMPRESSION: Needle localization of the right breast.  No apparent complications.   Original Report Authenticated By: Baird Lyons, M.D.     ASSESSMENT: 52 year old female with  #1 bilateral breast cancers triple negative on the right side ER positive on the left side. She is status post bilateral lumpectomies. Clinically she's doing well. She will receive Adriamycin Cytoxan given dose dense x4 cycles followed by Taxol carbo x12 cycles.  Rationale risks benefits and side effects were discussed.  Currently Taxol carbo week 6.   #2 patient has had an echocardiogram Port-A-Cath placed and chemotherapy teaching class.  #3 Blepharitis   PLAN:   #1 Doing well.  Patient will proceed with Taxol/Carbo week 6 labs are stable. Numbness is stable on Gabapentin.     #2 The eyes are worse.  I prescribed tobradex opthalmic gtts for conjunctivitis.    #3 I spoke with Dorothyann Gibbs who will contact the patient and assist with ordering two lymphedema sleeves.    #4 Patient will return next week for labs and appointment for week 7 of treatment.     All questions were answered. The patient knows to call the clinic with any problems, questions or concerns. We can certainly see the patient much sooner if necessary.  I spent 25 minutes counseling the  patient face to face. The total time spent in the appointment was 30 minutes.  Cherie Ouch Lyn Hollingshead, NP  Medical Oncology [facility] Phone: [phone] 04/14/2013, 10:21 AM

## 2013-04-14 NOTE — Patient Instructions (Addendum)
Doing well.  Proceed with chemotherapy.    Dexamethasone; Tobramycin eye suspension What is this medicine? DEXAMETHASONE; TOBRAMYCIN (dex a METH a sone; toe bra MYE sin) contains a steroid and an aminoglycoside antibiotic. It is used to treat bacterial eye infections. It will also decrease swelling, redness, and itching. This medicine may be used for other purposes; ask your health care provider or pharmacist if you have questions. What should I tell my health care provider before I take this medicine? They need to know if you have any of these conditions: -any active infection -cataracts -diabetes -glaucoma -wear contact lenses -an unusual or allergic reaction to dexamethasone, tobramycin, corticosteroids, other medicines, foods, dyes, or preservatives -pregnant or trying to get pregnant -breast-feeding How should I use this medicine? This medicine is only for use in the eye. Follow the directions on the prescription label. Wash hands before and after use. Shake well before using. Tilt your head back slightly and pull your lower eyelid down with your index finger to form a pouch. Try not to touch the tip of the dropper or tube to your eye, fingertips, or other surface. Squeeze the prescribed number of drops into the pouch. Close the eye gently to spread the drops. Do not use your medicine more often than directed. Finish the full course of medicine prescribed by your doctor or health care professional even if your condition is better. Do not stop using except on the advice of your doctor or health care professional. Talk to your pediatrician regarding the use of this medicine in children. Special care may be needed. Overdosage: If you think you have taken too much of this medicine contact a poison control center or emergency room at once. NOTE: This medicine is only for you. Do not share this medicine with others. What if I miss a dose? If you miss a dose, take it as soon as you can. If it is  almost time for your next dose, take only that dose. Do not take double or extra doses. What may interact with this medicine? Interactions are not expected. Do not use any other eye products without telling your doctor or health care professional. This list may not describe all possible interactions. Give your health care provider a list of all the medicines, herbs, non-prescription drugs, or dietary supplements you use. Also tell them if you smoke, drink alcohol, or use illegal drugs. Some items may interact with your medicine. What should I watch for while using this medicine? Check with your doctor or health care professional if your condition does not get better within 5 days, or if it gets worse. Tell your doctor or health care professional if you are exposed to anyone with measles or chickenpox, or if you develop sores or blisters that do not heal properly. If you wear contact lenses, ask your doctor or health care professional when you can use your lenses again. A burning or stinging reaction that does not go away may mean you are allergic to this product. Stop using and call your doctor or health care professional. This medicine can make certain eye conditions worse. Only use it for conditions for which your doctor or health care professional has prescribed. To prevent the spread of infection, do not share eye products, towels and washcloths with anyone else. Throw away any unused eye products. What side effects may I notice from receiving this medicine? Side effects that you should report to your doctor or health care professional as soon as possible: -allergic  reactions like skin rash, itching or hives, swelling of the face, lips, or tongue -burning, stinging or swelling of the eyelids -changes in vision -eye pain -infection -watery eyes Side effects that usually do not require medical attention (report to your doctor or health care professional if they continue or are bothersome): -eye  irritation, itching -mild burning, redness or stinging in the eye -temporary watering or blurring of vision This list may not describe all possible side effects. Call your doctor for medical advice about side effects. You may report side effects to FDA at 1-800-FDA-1088. Where should I keep my medicine? Keep out of the reach of children. Store between 8 and 27 degrees C (46 and 80 degrees F). Store suspension upright. Throw away any unused medicine after the expiration date. NOTE: This sheet is a summary. It may not cover all possible information. If you have questions about this medicine, talk to your doctor, pharmacist, or health care provider.  2013, [employer]. (10/29/2007 11:36:26 AM)

## 2013-04-14 NOTE — Patient Instructions (Addendum)
[facility] Discharge Instructions for Patients Receiving Chemotherapy  Today you received the following chemotherapy agents: Taxol, Carboplatin   To help prevent nausea and vomiting after your treatment, we encourage you to take your nausea medication as directed by your physician.   If you develop nausea and vomiting that is not controlled by your nausea medication, call the clinic.   BELOW ARE SYMPTOMS THAT SHOULD BE REPORTED IMMEDIATELY:  *FEVER GREATER THAN 100.5 F  *CHILLS WITH OR WITHOUT FEVER  NAUSEA AND VOMITING THAT IS NOT CONTROLLED WITH YOUR NAUSEA MEDICATION  *UNUSUAL SHORTNESS OF BREATH  *UNUSUAL BRUISING OR BLEEDING  TENDERNESS IN MOUTH AND THROAT WITH OR WITHOUT PRESENCE OF ULCERS  *URINARY PROBLEMS  *BOWEL PROBLEMS  UNUSUAL RASH Items with * indicate a potential emergency and should be followed up as soon as possible.  Feel free to call the clinic you have any questions or concerns. The clinic phone number is [phone].    

## 2013-04-15 ENCOUNTER — Other Ambulatory Visit (INDEPENDENT_AMBULATORY_CARE_PROVIDER_SITE_OTHER): Payer: Self-pay | Admitting: *Deleted

## 2013-04-15 DIAGNOSIS — C50919 Malignant neoplasm of unspecified site of unspecified female breast: Secondary | ICD-10-CM

## 2013-04-15 MED ORDER — UNABLE TO FIND: Status: DC

## 2013-04-21 ENCOUNTER — Telehealth: Payer: Self-pay | Admitting: *Deleted

## 2013-04-21 ENCOUNTER — Encounter: Payer: Self-pay | Admitting: Adult Health

## 2013-04-21 ENCOUNTER — Other Ambulatory Visit (HOSPITAL_BASED_OUTPATIENT_CLINIC_OR_DEPARTMENT_OTHER): Payer: BC Managed Care – PPO | Admitting: Lab

## 2013-04-21 ENCOUNTER — Ambulatory Visit: Payer: BC Managed Care – PPO

## 2013-04-21 ENCOUNTER — Telehealth: Payer: Self-pay | Admitting: Oncology

## 2013-04-21 ENCOUNTER — Ambulatory Visit (HOSPITAL_BASED_OUTPATIENT_CLINIC_OR_DEPARTMENT_OTHER): Payer: BC Managed Care – PPO | Admitting: Adult Health

## 2013-04-21 VITALS — BP 134/89 | HR 99 | Temp 98.6°F | Resp 20 | Ht 63.0 in | Wt 189.9 lb

## 2013-04-21 DIAGNOSIS — C50411 Malignant neoplasm of upper-outer quadrant of right female breast: Secondary | ICD-10-CM

## 2013-04-21 DIAGNOSIS — C50919 Malignant neoplasm of unspecified site of unspecified female breast: Secondary | ICD-10-CM

## 2013-04-21 DIAGNOSIS — C50419 Malignant neoplasm of upper-outer quadrant of unspecified female breast: Secondary | ICD-10-CM

## 2013-04-21 DIAGNOSIS — L271 Localized skin eruption due to drugs and medicaments taken internally: Secondary | ICD-10-CM

## 2013-04-21 DIAGNOSIS — L270 Generalized skin eruption due to drugs and medicaments taken internally: Secondary | ICD-10-CM

## 2013-04-21 LAB — CBC WITH DIFFERENTIAL/PLATELET
Basophils Absolute: 0 10*3/uL (ref 0.0–0.1)
EOS%: 0 % (ref 0.0–7.0)
Eosinophils Absolute: 0 10*3/uL (ref 0.0–0.5)
HGB: 11.2 g/dL — ABNORMAL LOW (ref 11.6–15.9)
LYMPH%: 20.5 % (ref 14.0–49.7)
MCH: 35 pg — ABNORMAL HIGH (ref 25.1–34.0)
MCV: 101.6 fL — ABNORMAL HIGH (ref 79.5–101.0)
MONO%: 9.4 % (ref 0.0–14.0)
NEUT#: 2.1 10*3/uL (ref 1.5–6.5)
Platelets: 176 10*3/uL (ref 145–400)
RDW: 16.3 % — ABNORMAL HIGH (ref 11.2–14.5)

## 2013-04-21 MED ORDER — PREDNISONE (PAK) 10 MG PO TABS: ORAL_TABLET | ORAL | Status: DC

## 2013-04-21 NOTE — Telephone Encounter (Signed)
Lmom to call our office. Time to schedule a carotid doppler after 05/07/13.

## 2013-04-21 NOTE — Telephone Encounter (Signed)
Pt states she will call back to schedule after she receives her chemo schedule.

## 2013-04-21 NOTE — Patient Instructions (Addendum)
You will not receive treatment today.  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  Continue to moisturize your skin.    Gemcitabine injection What is this medicine? GEMCITABINE (jem SIT a been) is a chemotherapy drug. This medicine is used to treat many types of cancer like breast cancer, lung cancer, pancreatic cancer, and ovarian cancer. This medicine may be used for other purposes; ask your health care provider or pharmacist if you have questions. What should I tell my health care provider before I take this medicine? They need to know if you have any of these conditions: -blood disorders -infection -kidney disease -liver disease -recent or ongoing radiation therapy -an unusual or allergic reaction to gemcitabine, other chemotherapy, other medicines, foods, dyes, or preservatives -pregnant or trying to get pregnant -breast-feeding How should I use this medicine? This drug is given as an infusion into a vein. It is administered in a hospital or clinic by a specially trained health care professional. Talk to your pediatrician regarding the use of this medicine in children. Special care may be needed. Overdosage: If you think you have taken too much of this medicine contact a poison control center or emergency room at once. NOTE: This medicine is only for you. Do not share this medicine with others. What if I miss a dose? It is important not to miss your dose. Call your doctor or health care professional if you are unable to keep an appointment. What may interact with this medicine? -medicines to increase blood counts like filgrastim, pegfilgrastim, sargramostim -some other chemotherapy drugs like cisplatin -vaccines Talk to your doctor or health care professional before taking any of these medicines: -acetaminophen -aspirin -ibuprofen -ketoprofen -naproxen This list may not describe all possible interactions. Give your health care provider a list of  all the medicines, herbs, non-prescription drugs, or dietary supplements you use. Also tell them if you smoke, drink alcohol, or use illegal drugs. Some items may interact with your medicine. What should I watch for while using this medicine? Visit your doctor for checks on your progress. This drug may make you feel generally unwell. This is not uncommon, as chemotherapy can affect healthy cells as well as cancer cells. Report any side effects. Continue your course of treatment even though you feel ill unless your doctor tells you to stop. In some cases, you may be given additional medicines to help with side effects. Follow all directions for their use. Call your doctor or health care professional for advice if you get a fever, chills or sore throat, or other symptoms of a cold or flu. Do not treat yourself. This drug decreases your body's ability to fight infections. Try to avoid being around people who are sick. This medicine may increase your risk to bruise or bleed. Call your doctor or health care professional if you notice any unusual bleeding. Be careful brushing and flossing your teeth or using a toothpick because you may get an infection or bleed more easily. If you have any dental work done, tell your dentist you are receiving this medicine. Avoid taking products that contain aspirin, acetaminophen, ibuprofen, naproxen, or ketoprofen unless instructed by your doctor. These medicines may hide a fever. Women should inform their doctor if they wish to become pregnant or think they might be pregnant. There is a potential for serious side effects to an unborn child. Talk to your health care professional or pharmacist for more information. Do not breast-feed an infant while  taking this medicine. What side effects may I notice from receiving this medicine? Side effects that you should report to your doctor or health care professional as soon as possible: -allergic reactions like skin rash, itching or  hives, swelling of the face, lips, or tongue -low blood counts - this medicine may decrease the number of white blood cells, red blood cells and platelets. You may be at increased risk for infections and bleeding. -signs of infection - fever or chills, cough, sore throat, pain or difficulty passing urine -signs of decreased platelets or bleeding - bruising, pinpoint red spots on the skin, black, tarry stools, blood in the urine -signs of decreased red blood cells - unusually weak or tired, fainting spells, lightheadedness -breathing problems -chest pain -mouth sores -nausea and vomiting -pain, swelling, redness at site where injected -pain, tingling, numbness in the hands or feet -stomach pain -swelling of ankles, feet, hands -unusual bleeding Side effects that usually do not require medical attention (report to your doctor or health care professional if they continue or are bothersome): -constipation -diarrhea -hair loss -loss of appetite -stomach upset This list may not describe all possible side effects. Call your doctor for medical advice about side effects. You may report side effects to FDA at 1-800-FDA-1088. Where should I keep my medicine? This drug is given in a hospital or clinic and will not be stored at home. NOTE: This sheet is a summary. It may not cover all possible information. If you have questions about this medicine, talk to your doctor, pharmacist, or health care provider.  2013, [employer]. (12/22/2007 6:45:54 PM)  Carboplatin injection What is this medicine? CARBOPLATIN (KAR boe pla tin) is a chemotherapy drug. It targets fast dividing cells, like cancer cells, and causes these cells to die. This medicine is used to treat ovarian cancer and many other cancers. This medicine may be used for other purposes; ask your health care provider or pharmacist if you have questions. What should I tell my health care provider before I take this medicine? They need  to know if you have any of these conditions: -blood disorders -hearing problems -kidney disease -recent or ongoing radiation therapy -an unusual or allergic reaction to carboplatin, cisplatin, other chemotherapy, other medicines, foods, dyes, or preservatives -pregnant or trying to get pregnant -breast-feeding How should I use this medicine? This drug is usually given as an infusion into a vein. It is administered in a hospital or clinic by a specially trained health care professional. Talk to your pediatrician regarding the use of this medicine in children. Special care may be needed. Overdosage: If you think you have taken too much of this medicine contact a poison control center or emergency room at once. NOTE: This medicine is only for you. Do not share this medicine with others. What if I miss a dose? It is important not to miss a dose. Call your doctor or health care professional if you are unable to keep an appointment. What may interact with this medicine? -medicines for seizures -medicines to increase blood counts like filgrastim, pegfilgrastim, sargramostim -some antibiotics like amikacin, gentamicin, neomycin, streptomycin, tobramycin -vaccines Talk to your doctor or health care professional before taking any of these medicines: -acetaminophen -aspirin -ibuprofen -ketoprofen -naproxen This list may not describe all possible interactions. Give your health care provider a list of all the medicines, herbs, non-prescription drugs, or dietary supplements you use. Also tell them if you smoke, drink alcohol, or use illegal drugs. Some items may interact with your  medicine. What should I watch for while using this medicine? Your condition will be monitored carefully while you are receiving this medicine. You will need important blood work done while you are taking this medicine. This drug may make you feel generally unwell. This is not uncommon, as chemotherapy can affect healthy  cells as well as cancer cells. Report any side effects. Continue your course of treatment even though you feel ill unless your doctor tells you to stop. In some cases, you may be given additional medicines to help with side effects. Follow all directions for their use. Call your doctor or health care professional for advice if you get a fever, chills or sore throat, or other symptoms of a cold or flu. Do not treat yourself. This drug decreases your body's ability to fight infections. Try to avoid being around people who are sick. This medicine may increase your risk to bruise or bleed. Call your doctor or health care professional if you notice any unusual bleeding. Be careful brushing and flossing your teeth or using a toothpick because you may get an infection or bleed more easily. If you have any dental work done, tell your dentist you are receiving this medicine. Avoid taking products that contain aspirin, acetaminophen, ibuprofen, naproxen, or ketoprofen unless instructed by your doctor. These medicines may hide a fever. Do not become pregnant while taking this medicine. Women should inform their doctor if they wish to become pregnant or think they might be pregnant. There is a potential for serious side effects to an unborn child. Talk to your health care professional or pharmacist for more information. Do not breast-feed an infant while taking this medicine. What side effects may I notice from receiving this medicine? Side effects that you should report to your doctor or health care professional as soon as possible: -allergic reactions like skin rash, itching or hives, swelling of the face, lips, or tongue -signs of infection - fever or chills, cough, sore throat, pain or difficulty passing urine -signs of decreased platelets or bleeding - bruising, pinpoint red spots on the skin, black, tarry stools, nosebleeds -signs of decreased red blood cells - unusually weak or tired, fainting spells,  lightheadedness -breathing problems -changes in hearing -changes in vision -chest pain -high blood pressure -low blood counts - This drug may decrease the number of white blood cells, red blood cells and platelets. You may be at increased risk for infections and bleeding. -nausea and vomiting -pain, swelling, redness or irritation at the injection site -pain, tingling, numbness in the hands or feet -problems with balance, talking, walking -trouble passing urine or change in the amount of urine Side effects that usually do not require medical attention (report to your doctor or health care professional if they continue or are bothersome): -hair loss -loss of appetite -metallic taste in the mouth or changes in taste This list may not describe all possible side effects. Call your doctor for medical advice about side effects. You may report side effects to FDA at 1-800-FDA-1088. Where should I keep my medicine? This drug is given in a hospital or clinic and will not be stored at home. NOTE: This sheet is a summary. It may not cover all possible information. If you have questions about this medicine, talk to your doctor, pharmacist, or health care provider.  2012, [employer]. (11/17/2007 2:38:05 PM)

## 2013-04-21 NOTE — Progress Notes (Signed)
OFFICE PROGRESS NOTE  CC  Amy Manns, MD [address] [place] [address], [place] [address] Dr. Claud Kelp  Dr. Antony Blackbird  DIAGNOSIS: 52 year old female with new diagnosis of bilateral breast cancers.   STAGE:  Cancer of upper-outer quadrant of female breast  Primary site: Breast (Bilateral)  Staging method: AJCC 7th Edition  Clinical: Stage IA (T1c, N0, cM0)  Summary: Stage IA (T1c, N0, cM0)   PRIOR THERAPY: #1Patient recently underwent screening mammograms and she was found to have bilateral breast abnormalities.diagnostic mammogram showed in the right breast an ill-defined 2 cm mass with a few associated well defined microcalcifications over the upper outer quadrant. Spot compression images of the left breast demonstrated an ill defined spiculated 8 mm mass centrally. Ultrasound showed irregular bordered heterogeneous hypoechoic solid mass at the 10:00 position of the right breast 6 cm from the nipple measuring 1.1 x 1.3 x 1.4 cm. Also in this location was an adjacent smaller irregular hypoechoic mass measuring 3 x 5 x 6 mm. The smaller mass was 1 cm from the large more irregular mass. Ultrasound of the axilla demonstrated single lymph node with thickened cortex. Ultrasound of the left breast demonstrated an ill-defined hypoechoic mass with distal acoustic shadowing 7 to 8:00 position 3 cm from the nipple located deep and measuring 5 x 8 x 8 mm ultrasound of the left axilla showed normal appearing lymph nodes.   #2Patient went on to have needle core biopsies performed of both of the masses. 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 The tumor was ER positive PR positive with a Ki-67 of 9% the second tumor was ER negative PR negative HER-2/neu negative with Ki-6792% and elevated.  #3 Patient had MRIs of the breasts performed. There was  noted to be 1.7 cm irregular enhancing mass located within the upper-outer quadrant of the right breast with 3 adjacent worrisome enhancing satellite nodules in aggregate the nodules and mass measures 3.8 cm. In the left breast 8 mm irregular enhancing mass located within the central left breast was noted. No evidence of axillary or internal mammary adenopathy.  #4patient is status post bilateral lumpectomies. The right breast revealed a 1.5 cm grade 3 invasive ductal carcinoma ER/PR negative HER-2/neu negative Ki-67 92%. Sentinel node was negative. Left breast showed 1.0 cm grade 3 invasive ductal carcinoma ER/PR positive HER-2/neu negative sentinel node was negative.  #5 patient is to begin adjuvant chemotherapy consisting of Adriamycin Cytoxan every 2 weeks for a total of 4 cycles 12/30/12. This would then be followed by Taxol and carboplatinum weekly for 12 weeks. Patient will need adjuvant radiation therapy. Because one of the tumor is ER positive she will also receive antiestrogen therapy.  CURRENT THERAPY: Taxol Carbo week 6  INTERVAL HISTORY: Amy Leonard 52 y.o. female returns for followup visit prior to her weekly Taxol/Carbo.   She has an erythematous rash on her hands and wrists that is spreading up to her forearms.  This started last week and has gotten progressively worse.  Her numbness remains, however is intermittent.  She is taking the Gabapentin as prescribed.  The eyes and the gritty feeling, and crusting on the eyelashes is improved.  Otherwise, a 10 point ROS is neg.    MEDICAL HISTORY: Past Medical History  Diagnosis Date  . Elevated WBC count     nl diff  .  Chronic back pain     spinal stenosis  . Tobacco abuse   . Anxiety and depression   . HSV infection     recurrent (side and buttox)  . Migraine   . Glaucoma     ?  . Carpal tunnel syndrome   . HLD (hyperlipidemia)   . Folliculitis 05/05/2011  . Anxiety   . Depression   . 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  GERD (gastroesophageal reflux disease)   . Hypertension   . Breast cancer   . Wears dentures     top    ALLERGIES:  is allergic to bupropion hcl; chlorhexidine gluconate; hydrocod polst-cpm polst er; lisinopril; and naproxen sodium.  MEDICATIONS:  Current Outpatient Prescriptions  Medication Sig Dispense Refill  . Acetaminophen (TYLENOL ARTHRITIS PAIN PO) Take 2 tablets by mouth as needed (pain).       Marland Kitchen acyclovir ointment (ZOVIRAX) 5 % Apply 1 application topically as needed (flare-up).  15 g  1  . aspirin 81 MG chewable tablet Chew 81 mg by mouth daily.      . ATIVAN 0.5 MG tablet Take 1 tablet (0.5 mg total) by mouth every 6 (six) hours as needed (Nausea or vomiting).  45 tablet  0  . atorvastatin (LIPITOR) 10 MG tablet TAKE 1 TABLET (10 MG TOTAL) BY MOUTH DAILY.  90 tablet  0  . calcium carbonate (TUMS - DOSED IN MG ELEMENTAL CALCIUM) 500 MG chewable tablet Chew by mouth as directed.        . ciprofloxacin (CIPRO) 500 MG tablet Take 1 tablet by mouth 2 (two) times daily.      . COMPRO 25 MG suppository       . cyclobenzaprine (FLEXERIL) 10 MG tablet Take 10 mg by mouth as needed (muscle spasm).       Marland Kitchen dexamethasone (DECADRON) 4 MG tablet Take 1 tablet (4 mg total) by mouth as directed. 2 tablets PO the day after chemo then 2 tablets PO BID x 2 days.  30 tablet  4  . fluconazole (DIFLUCAN) 100 MG tablet Take 2 tablets (200 mg total) by mouth daily.  14 tablet  0  . gabapentin (NEURONTIN) 100 MG capsule Take 1 capsule (100 mg total) by mouth 3 (three) times daily.  90 capsule  0  . KLOR-CON M20 20 MEQ tablet Take 1 tablet by mouth daily.      Marland Kitchen lidocaine-prilocaine (EMLA) cream Apply topically as needed.  30 g  8  . losartan (COZAAR) 50 MG tablet Take 1 tablet (50 mg total) by mouth daily.  90 tablet  2  . nicotine (NICODERM CQ - DOSED IN MG/24 HOURS) 21 mg/24hr patch Place 1 patch onto the skin daily.  28 patch  0  . nystatin (MYCOSTATIN) 100000 UNIT/ML suspension Take 5 mLs (500,000  Units total) by mouth 2 (two) times daily.  240 mL  3  . omeprazole (PRILOSEC) 40 MG capsule Take 1 capsule (40 mg total) by mouth 2 (two) times daily.  180 capsule  3  . ondansetron (ZOFRAN) 8 MG tablet       . prochlorperazine (COMPAZINE) 10 MG tablet       . prochlorperazine (COMPAZINE) 25 MG suppository Place 1 suppository (25 mg total) rectally every 12 (twelve) hours as needed for nausea.  12 suppository  3  . simethicone (MYLICON) 80 MG chewable tablet Chew 1 tablet (80 mg total) by mouth every 8 (eight) hours as needed for flatulence.  60 tablet  3  . tobramycin-dexamethasone (TOBRADEX) ophthalmic solution Place 1 drop into both eyes every 4 (four) hours while awake.  10 mL  0  . UNABLE TO FIND Rx: L8000-Mastectomy Bra (Quantity: 2) Dx: 174.9 Bilateral Lumpectomy  1 each  0   No current facility-administered medications for this visit.    SURGICAL HISTORY:  Past Surgical History  Procedure Laterality Date  . Btl    . Epidural steroid injection  2008  . Colonoscopy    . Upper gi endoscopy    . Partial mastectomy with needle localization and axillary sentinel lymph node bx Bilateral 11/23/2012    Procedure: PARTIAL MASTECTOMY WITH NEEDLE LOCALIZATION AND AXILLARY SENTINEL LYMPH NODE BX;  Surgeon: Ernestene Mention, MD;  Location: [facility];  Service: General;  Laterality: Bilateral;  . Portacath placement Right 11/23/2012    Procedure: INSERTION PORT-A-CATH;  Surgeon: Ernestene Mention, MD;  Location: [facility];  Service: General;  Laterality: Right;    REVIEW OF SYSTEMS:  Pertinent items are noted in HPI.   PHYSICAL EXAMINATION: Blood pressure 134/89, pulse 99, temperature 98.6 F (37 C), temperature source Oral, resp. rate 20, height 5\' 3"  (1.6 m), weight 189 lb 14.4 oz (86.138 kg). Body mass index is 33.65 kg/(m^2). General: Patient is a well appearing female in no acute distress HEENT: PERRLA, no scleral injection, crusting, or puffiness of eyes  noted.  sclerae anicteric no conjunctival pallor, MMM Neck: supple, no palpable adenopathy Lungs: clear to auscultation bilaterally, no wheezes, rhonchi, or rales Cardiovascular: regular rhythm, S1, S2, no murmurs, rubs or gallops Abdomen: Soft, non-tender, non-distended, normoactive bowel sounds, no HSM Extremities: warm and well perfused, no clubbing, cyanosis, or edema, entire soles of feet are not erythematous no skin breakdown noted, well moisturizied.  Her fingertips have no noted abnormality Skin: No rashes or lesions Neuro: Non-focal Breasts: Bilateral lumpectomy sites without nodularity, no masses or skin changes in either breast. ECOG PERFORMANCE STATUS: 0 - Asymptomatic  LABORATORY DATA: Lab Results  Component Value Date   WBC 3.0* 04/21/2013   HGB 11.2* 04/21/2013   HCT 32.5* 04/21/2013   MCV 101.6* 04/21/2013   PLT 176 04/21/2013      Chemistry      Component Value Date/Time   NA 139 04/14/2013 0912   NA 135 01/08/2013 0555   K 3.7 04/14/2013 0912   K 4.0 01/08/2013 0555   CL 109* 02/17/2013 1120   CL 100 01/08/2013 0555   CO2 22 04/14/2013 0912   CO2 28 01/08/2013 0555   BUN 10.9 04/14/2013 0912   BUN 10 01/08/2013 0555   CREATININE 0.6 04/14/2013 0912   CREATININE 0.62 01/08/2013 0555      Component Value Date/Time   CALCIUM 8.3* 04/14/2013 0912   CALCIUM 9.1 01/08/2013 0555   ALKPHOS 47 04/14/2013 0912   ALKPHOS 67 01/05/2013 2245   AST 20 04/14/2013 0912   AST 10 01/05/2013 2245   ALT 32 04/14/2013 0912   ALT 15 01/05/2013 2245   BILITOT 0.39 04/14/2013 0912   BILITOT 0.5 01/05/2013 2245    ADDITIONAL INFORMATION: 1. CHROMOGENIC IN-SITU HYBRIDIZATION Results: HER-2/NEU BY CISH - NO AMPLIFICATION OF HER-2 DETECTED. RESULT RATIO OF HER2: CEP 17 SIGNALS 1.00 AVERAGE HER2 COPY NUMBER PER CELL 1.55 REFERENCE RANGE NEGATIVE HER2/Chr17 Ratio <2.0 and Average HER2 copy number <4.0 EQUIVOCAL HER2/Chr17 Ratio <2.0 and Average HER2 copy number 4.0 and <6.0 POSITIVE HER2/Chr17  Ratio >=2.0 and/or Average HER2 copy number >=6.0 Pecola Leisure MD Pathologist,  Electronic Signature ( Signed 12/01/2012) 3. CHROMOGENIC IN-SITU HYBRIDIZATION Results: HER-2/NEU BY CISH - NO AMPLIFICATION OF HER-2 DETECTED. RESULT RATIO OF HER2: CEP 17 SIGNALS 1.22 AVERAGE HER2 COPY NUMBER PER CELL 1.40 REFERENCE RANGE NEGATIVE HER2/Chr17 Ratio <2.0 and Average HER2 copy number <4.0 EQUIVOCAL HER2/Chr17 Ratio <2.0 and Average HER2 copy number 4.0 and <6.0 POSITIVE HER2/Chr17 Ratio >=2.0 and/or Average HER2 copy number >=6.0 1 of 5 FINAL for VOULA, WALN ([id number]) ADDITIONAL INFORMATION:(continued) Pecola Leisure MD Pathologist, Electronic Signature ( Signed 12/01/2012) FINAL DIAGNOSIS Diagnosis 1. Breast, lumpectomy, Right - INVASIVE DUCTAL CARCINOMA, GRADE III, SEE COMMENT. - INVASIVE CARCINOMA IS 0.9 CM FROM NEAREST MARGIN (INFERIOR). - NO LYMPHOVASCULAR INVASION IDENTIFIED. - HIGH GRADE DUCTAL CARCINOMA IN SITU - IN SITU CARCINOMA IS 1 MM FROM THE NEAREST LATERAL MARGIN - SEE TUMOR SYNOPTIC TEMPLATE BELOW. 2. Lymph nodes, regional resection, Right axilla - ELEVEN LYMPH NODES, NEGATIVE FOR TUMOR (0/11). 3. Breast, lumpectomy, Left - INVASIVE DUCTAL CARCINOMA, GRADE I, SEE COMMENT. - LYMPHOVASCULAR INVASION IDENTIFIED - INVASIVE CARCINOMA IS BROADLY PRESENT AT POSTERIOR MARGIN - DUCTAL CARCINOMA IN SITU, GRADE I. - IN SITU CARCINOMA IS FOCALLY PRESENT AT POSTERIOR MARGIN - SEE TUMOR SYNOPTIC TEMPLATE BELOW. 4. Breast, excision, Left - BENIGN BREAST TISSUE, SEE COMMENT. - NEGATIVE FOR ATYPIA OR MALIGNANCY. - SURGICAL MARGIN, NEGATIVE FOR ATYPIA OR MALIGNANCY. 5. Lymph node, sentinel, biopsy, Left axillary - ONE LYMPH NODE, POSITIVE FOR METASTATIC MAMMARY CARCINOMA (1/1). Microscopic Comment 1. BREAST, INVASIVE TUMOR, WITH LYMPH NODE SAMPLING Specimen, including laterality: Right breast Procedure: Lumpectomy Grade: III of III Tubule formation: 3 Nuclear  pleomorphism: 3 Mitotic: 3 Tumor size (gross measurement: 1.5 cm Margins: Invasive, distance to closest margin: 0.9 cm In-situ, distance to closest margin: 0.1 cm If margin positive, focally or broadly: N/A Lymphovascular invasion: Absent Ductal carcinoma in situ: Present Grade: III of III Extensive intraductal component: Absent Lobular neoplasia: Absent Tumor focality: Multifocal Treatment effect: None 2 of 5 FINAL for ROBYNNE, ROAT ([id number]) Microscopic Comment(continued) If present, treatment effect in breast tissue, lymph nodes or both: N/A Extent of tumor: Skin: N/A Nipple: N/A Skeletal muscle: N/A Lymph nodes: # examined: 11 (Part 2) Lymph nodes with metastasis: 0 Breast prognostic profile: Estrogen receptor: Repeated, previous study demonstrated 0% positivity ([id number]) Progesterone receptor: Repeated, previous study demonstrated 0% positivity ([id number]) Her 2 neu: Repeated, previous study demonstrated no amplification (1.38) ([id number]) Ki-67: Not repeated, previous study demonstrated 92% proliferation rate ([id number]) Non-neoplastic breast: Previous biopsy site, fibrocystic change, sclerosing adenosis and benign calcifications in benign ducts and lobules. TNM: pT1c, pN0, pMX Comments: There are a total of four distinct areas of abnormality identified. The first 1.5 cm corresponds to the invasive ductal carcinoma. The second 0.9 cm area consists of high grade ductal carcinoma in situ with associated fibrocystic change and fibroadenomatoid nodules. In this focus, the in situ carcinoma is 1 mm from the nearest lateral margin. The third 0.4 cm area consists of fat necrosis and fibrosis. There are no atypical or malignant findings in this area. The fourth and final 0.3 cm area demonstrate non-neoplastic findings to include fibrocystic change. There are no atypical or malignant epithelial findings in this area either. 3. BREAST, INVASIVE TUMOR, WITH LYMPH NODE  SAMPLING Specimen, including laterality: Left breast Procedure: Lumpectomy Grade: I of III Tubule formation: 3 Nuclear pleomorphism: 3 Mitotic: 1 Tumor size (gross measurement): 1.0 cm Margins: Invasive, distance to closest margin: Present at posterior In-situ, distance to closest margin: Present at posterior If margin positive, focally or broadly: Broadly (  invasive) and focally (in situ) Lymphovascular invasion: Present Ductal carcinoma in situ: Present Grade: I of III Extensive intraductal component: Absent Lobular neoplasia: Absent Tumor focality: Unifocal Treatment effect: None If present, treatment effect in breast tissue, lymph nodes or both: N/A Extent of tumor: Skin: N/A Nipple: N/A Skeletal muscle: N/A Lymph nodes: # examined: 1 (Part 5) Lymph nodes with metastasis: 1 Isolated tumor cells (< 0.2 mm): 0 Micrometastasis: (> 0.2 mm and < 2.0 mm): 1 Macrometastasis: (> 2.0 mm): 0 3 of 5 FINAL for ISRAELLA, HUBERT ([id number]) Microscopic Comment(continued) Extracapsular extension: Present Breast prognostic profile: Estrogen receptor: Not repeated, previous study demonstrated 100% positivity ([id number]). Progesterone receptor: Not repeated, previous study demonstrated 25% positivity ([id number]) Her 2 neu: Repeated, previous study demonstrated no amplification (1.42) ([id number]) Ki-67: Not repeated, previous study demonstrated 9% proliferation rate ([id number]) Non-neoplastic breast: Previous biopsy site, fibrocystic change and microcalcifications in benign ducts and lobules. TNM: pT1b, pN43mi, PMX Comments: The additional 1.0 cm and 1.1 cm hemorrhagic areas grossly identified consist of benign breast tissue with microcalcifications and parenchymal hemorrhage. There are no atypical or malignant findings in these areas. 4. The surgical resection margin(s) of the specimen were inked and microscopically evaluated. There is no mass grossly identified. Representative  sections demonstrate predominantly benign fibrovascular and adipose tissue with focal areas of fibrocytic change and sclerosing adenosis. There are no atypical or malignant epithelial or stromal findings identified. 5. There are microscopic foci of intranodal metastatic mammary tumor deposits spanning up to 1 mm. There is focal extracapsular extension identified. (CR:kh 11-25-12) [place] RUND DO Pathologist, Electronic Signature (Case signed 11/25/2012) Specimen Gross and Clinical Information   RADIOGRAPHIC STUDIES:  Chest 2 View  11/19/2012  *RADIOLOGY REPORT*  Clinical Data: Preop for breast cancer  CHEST - 2 VIEW  Comparison: None.  Findings: Cardiomediastinal silhouette is unremarkable.  No acute infiltrate or pleural effusion.  No pulmonary edema.  Bony thorax is unremarkable.  IMPRESSION: No active disease.   Original Report Authenticated By: Natasha Mead, M.D.    Nm Sentinel Node Inj-no Rpt (breast)  11/23/2012  CLINICAL DATA: Bilateral breast cancer   Sulfur colloid was injected intradermally by the nuclear medicine  technologist for breast cancer sentinel node localization.     Dg Chest Portable 1 View  11/23/2012  *RADIOLOGY REPORT*  Clinical Data: 52 year old female status post Port-A-Cath placement.  Breast cancer.  PORTABLE CHEST - 1 VIEW  Comparison: 11/19/2012.  Findings: Portable semi upright AP view 1441 hours.  Right chest subclavian approach Port-A-Cath placed.  Acute angulation of the catheter tubing at the level of the right axilla.  Tip at the level of the cavoatrial junction.  Interval postoperative changes to the right chest wall with subcutaneous appearing drain in place.  No pneumothorax.  No pleural effusion or pulmonary edema.  Mildly increased bibasilar opacity compatible with atelectasis.  Cardiac size and mediastinal contours are within normal limits.  Visualized tracheal air column is within normal limits.  IMPRESSION: 1.  Right subclavian approach chest Port-A-Cath in  place.  Tip at the level of the cavoatrial junction.  Mildly angulated catheter tubing at the level of the axilla. 2.  Interval postoperative changes to the right chest wall. No pneumothorax. 3.  Mild dependent atelectasis.   Original Report Authenticated By: Erskine Speed, M.D.    Dg Fluoro Guide Cv Line-no Report  11/23/2012  CLINICAL DATA: bil breast cancer   FLOURO GUIDE CV LINE  Fluoroscopy was utilized by the requesting physician.  No radiographic  interpretation.     Mm Lt Plc Breast Loc Dev   1st Lesion  Inc Mammo Guide  11/23/2012  *RADIOLOGY REPORT*  Clinical Data:  Left breast cancer  NEEDLE LOCALIZATION WITH MAMMOGRAPHIC GUIDANCE AND SPECIMEN RADIOGRAPH  Comparison:  Previous exams.  Patient presents for needle localization prior to surgery.  I met with the patient and we discussed the procedure of needle localization including benefits and alternatives. We discussed the high likelihood of a successful procedure. We discussed the risks of the procedure, including infection, bleeding, tissue injury, and further surgery. Informed, written consent was given.  Using mammographic guidance, sterile technique, 2% lidocaine and a #9 modified Kopans needle, the mass and clip in the central aspect of the left breast was localized using a superior approach.  The films are marked for Dr. Derrell Lolling.  Specimen radiograph was performed at [facility], and confirms the mass and clip were present in the tissue sample. The clip was located at the margin of the surgical specimen.  Dr.  Derrell Lolling states she had taken more tissue at the margin to clear the border.  The specimen is marked for pathology.  IMPRESSION: Needle localization of the left breast.  No apparent complications.   Original Report Authenticated By: Baird Lyons, M.D.    Mm Rt Plc Breast Loc Dev   1st Lesion  Inc Mammo Guide  11/23/2012  *RADIOLOGY REPORT*  Clinical Data:  Known right breast cancer  NEEDLE LOCALIZATION WITH MAMMOGRAPHIC GUIDANCE AND SPECIMEN  RADIOGRAPH  Comparison:  Previous exams.  Patient presents for needle localization prior to surgery.  I met with the patient and we discussed the procedure of needle localization including benefits and alternatives. We discussed the high likelihood of a successful procedure. We discussed the risks of the procedure, including infection, bleeding, tissue injury, and further surgery. Informed, written consent was given.  Using mammographic guidance, sterile technique, 2% lidocaine and a #7 and #9 modified Kopans needles, the mass and nodularity in the upper outer quadrant of the right breast was bracketedusing a lateral approach.  The films are marked for Dr. Derrell Lolling.  Specimen radiograph was performed at Day Surgery, and confirms the mass and clip are present in the tissue sample.  The specimen is marked for pathology.  IMPRESSION: Needle localization of the right breast.  No apparent complications.   Original Report Authenticated By: Baird Lyons, M.D.     ASSESSMENT: 52 year old female with  #1 bilateral breast cancers triple negative on the right side ER positive on the left side. She is status post bilateral lumpectomies. Clinically she's doing well. She will receive Adriamycin Cytoxan given dose dense x4 cycles followed by Taxol carbo x12 cycles.  Rationale risks benefits and side effects were discussed.  Currently Taxol carbo week 6.   #2 patient has had an echocardiogram Port-A-Cath placed and chemotherapy teaching class.  #3 Blepharitis   PLAN:   #1 Doing well. Patient has likely hand foot syndrome related to the taxol.  She will not receive chemotherapy today.  We will give her a steroid taper and see her back next week for evaluation for gemzar/carbo.      #2 The eyes are improved with the Tobradex eye gtts.     #3 She is working with [employer] supply in [place], [place] to get her sleeves and gloves ordered.     #4 Patient will return next week for labs and an appointment.   All  questions were answered. The patient knows to call the clinic  with any problems, questions or concerns. We can certainly see the patient much sooner if necessary.  I spent 25 minutes counseling the patient face to face. The total time spent in the appointment was 30 minutes.  Cherie Ouch Lyn Hollingshead, NP Medical Oncology [facility] Phone: [phone] 04/21/2013, 9:49 AM

## 2013-04-21 NOTE — Telephone Encounter (Signed)
Per staff message and POF I have scheduled appts.  JMW  

## 2013-04-27 ENCOUNTER — Encounter: Payer: Self-pay | Admitting: Oncology

## 2013-04-28 ENCOUNTER — Encounter: Payer: Self-pay | Admitting: Oncology

## 2013-04-28 ENCOUNTER — Other Ambulatory Visit: Payer: Self-pay | Admitting: Adult Health

## 2013-04-28 ENCOUNTER — Telehealth: Payer: Self-pay | Admitting: Oncology

## 2013-04-28 ENCOUNTER — Ambulatory Visit (HOSPITAL_BASED_OUTPATIENT_CLINIC_OR_DEPARTMENT_OTHER): Payer: BC Managed Care – PPO

## 2013-04-28 ENCOUNTER — Ambulatory Visit (HOSPITAL_BASED_OUTPATIENT_CLINIC_OR_DEPARTMENT_OTHER): Payer: BC Managed Care – PPO | Admitting: Adult Health

## 2013-04-28 ENCOUNTER — Encounter: Payer: Self-pay | Admitting: Adult Health

## 2013-04-28 ENCOUNTER — Other Ambulatory Visit (HOSPITAL_BASED_OUTPATIENT_CLINIC_OR_DEPARTMENT_OTHER): Payer: BC Managed Care – PPO | Admitting: Lab

## 2013-04-28 DIAGNOSIS — G589 Mononeuropathy, unspecified: Secondary | ICD-10-CM

## 2013-04-28 DIAGNOSIS — Z170 Estrogen receptor positive status [ER+]: Secondary | ICD-10-CM

## 2013-04-28 DIAGNOSIS — C50419 Malignant neoplasm of upper-outer quadrant of unspecified female breast: Secondary | ICD-10-CM

## 2013-04-28 DIAGNOSIS — L270 Generalized skin eruption due to drugs and medicaments taken internally: Secondary | ICD-10-CM

## 2013-04-28 DIAGNOSIS — Z171 Estrogen receptor negative status [ER-]: Secondary | ICD-10-CM

## 2013-04-28 DIAGNOSIS — C50919 Malignant neoplasm of unspecified site of unspecified female breast: Secondary | ICD-10-CM

## 2013-04-28 DIAGNOSIS — C50411 Malignant neoplasm of upper-outer quadrant of right female breast: Secondary | ICD-10-CM

## 2013-04-28 DIAGNOSIS — Z5111 Encounter for antineoplastic chemotherapy: Secondary | ICD-10-CM

## 2013-04-28 LAB — CBC WITH DIFFERENTIAL/PLATELET
BASO%: 0.3 % (ref 0.0–2.0)
EOS%: 0.3 % (ref 0.0–7.0)
LYMPH%: 26.1 % (ref 14.0–49.7)
MCH: 35 pg — ABNORMAL HIGH (ref 25.1–34.0)
MCHC: 33.6 g/dL (ref 31.5–36.0)
MCV: 104 fL — ABNORMAL HIGH (ref 79.5–101.0)
MONO%: 15.1 % — ABNORMAL HIGH (ref 0.0–14.0)
Platelets: 198 10*3/uL (ref 145–400)
RBC: 3.23 10*6/uL — ABNORMAL LOW (ref 3.70–5.45)
RDW: 16.5 % — ABNORMAL HIGH (ref 11.2–14.5)
nRBC: 0 % (ref 0–0)

## 2013-04-28 LAB — COMPREHENSIVE METABOLIC PANEL (CC13)
ALT: 34 U/L (ref 0–55)
AST: 21 U/L (ref 5–34)
Alkaline Phosphatase: 49 U/L (ref 40–150)
CO2: 23 mEq/L (ref 22–29)
Creatinine: 0.6 mg/dL (ref 0.6–1.1)
Sodium: 143 mEq/L (ref 136–145)
Total Bilirubin: 0.32 mg/dL (ref 0.20–1.20)
Total Protein: 5.5 g/dL — ABNORMAL LOW (ref 6.4–8.3)

## 2013-04-28 MED ORDER — ONDANSETRON 8 MG/50ML IVPB (CHCC)
8.0000 mg | Freq: Once | INTRAVENOUS | Status: AC
  Administered 2013-04-28: 8 mg via INTRAVENOUS

## 2013-04-28 MED ORDER — SODIUM CHLORIDE 0.9 % IV SOLN
800.0000 mg/m2 | Freq: Once | INTRAVENOUS | Status: AC
  Administered 2013-04-28: 1558 mg via INTRAVENOUS
  Filled 2013-04-28: qty 40.98

## 2013-04-28 MED ORDER — DEXAMETHASONE SODIUM PHOSPHATE 10 MG/ML IJ SOLN
10.0000 mg | Freq: Once | INTRAMUSCULAR | Status: AC
  Administered 2013-04-28: 10 mg via INTRAVENOUS

## 2013-04-28 MED ORDER — SODIUM CHLORIDE 0.9 % IV SOLN
300.0000 mg | Freq: Once | INTRAVENOUS | Status: AC
  Administered 2013-04-28: 300 mg via INTRAVENOUS
  Filled 2013-04-28: qty 30

## 2013-04-28 MED ORDER — SODIUM CHLORIDE 0.9 % IJ SOLN
10.0000 mL | INTRAMUSCULAR | Status: DC | PRN
  Administered 2013-04-28: 10 mL
  Filled 2013-04-28: qty 10

## 2013-04-28 MED ORDER — SODIUM CHLORIDE 0.9 % IV SOLN
Freq: Once | INTRAVENOUS | Status: AC
  Administered 2013-04-28: 11:00:00 via INTRAVENOUS

## 2013-04-28 MED ORDER — HEPARIN SOD (PORK) LOCK FLUSH 100 UNIT/ML IV SOLN
500.0000 [IU] | Freq: Once | INTRAVENOUS | Status: AC | PRN
  Administered 2013-04-28: 500 [IU]
  Filled 2013-04-28: qty 5

## 2013-04-28 NOTE — Progress Notes (Signed)
OFFICE PROGRESS NOTE  CC  [name], MD [address] [place] [address], [place] [address] Dr. Claud Kelp  Dr. Antony Blackbird  DIAGNOSIS: 52 year old female with new diagnosis of bilateral breast cancers.   STAGE:  Cancer of upper-outer quadrant of female breast  Primary site: Breast (Bilateral)  Staging method: AJCC 7th Edition  Clinical: Stage IA (T1c, N0, cM0)  Summary: Stage IA (T1c, N0, cM0)   PRIOR THERAPY: #1Patient recently underwent screening mammograms and she was found to have bilateral breast abnormalities.diagnostic mammogram showed in the right breast an ill-defined 2 cm mass with a few associated well defined microcalcifications over the upper outer quadrant. Spot compression images of the left breast demonstrated an ill defined spiculated 8 mm mass centrally. Ultrasound showed irregular bordered heterogeneous hypoechoic solid mass at the 10:00 position of the right breast 6 cm from the nipple measuring 1.1 x 1.3 x 1.4 cm. Also in this location was an adjacent smaller irregular hypoechoic mass measuring 3 x 5 x 6 mm. The smaller mass was 1 cm from the large more irregular mass. Ultrasound of the axilla demonstrated single lymph node with thickened cortex. Ultrasound of the left breast demonstrated an ill-defined hypoechoic mass with distal acoustic shadowing 7 to 8:00 position 3 cm from the nipple located deep and measuring 5 x 8 x 8 mm ultrasound of the left axilla showed normal appearing lymph nodes.   #2Patient went on to have needle core biopsies performed of both of the masses. 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 The tumor was ER positive PR positive with a Ki-67 of 9% the second tumor was ER negative PR negative HER-2/neu negative with Ki-6792% and elevated.  #3 Patient had MRIs of the breasts performed. There was  noted to be 1.7 cm irregular enhancing mass located within the upper-outer quadrant of the right breast with 3 adjacent worrisome enhancing satellite nodules in aggregate the nodules and mass measures 3.8 cm. In the left breast 8 mm irregular enhancing mass located within the central left breast was noted. No evidence of axillary or internal mammary adenopathy.  #4patient is status post bilateral lumpectomies. The right breast revealed a 1.5 cm grade 3 invasive ductal carcinoma ER/PR negative HER-2/neu negative Ki-67 92%. Sentinel node was negative. Left breast showed 1.0 cm grade 3 invasive ductal carcinoma ER/PR positive HER-2/neu negative sentinel node was negative.  #5 patient is to begin adjuvant chemotherapy consisting of Adriamycin Cytoxan every 2 weeks for a total of 4 cycles 12/30/12. This would then be followed by Taxol and carboplatinum weekly for 12 weeks. Patient will need adjuvant radiation therapy. Because one of the tumor is ER positive she will also receive antiestrogen therapy.  CURRENT THERAPY: Gemzar Carbo cycle 1 day 1  INTERVAL HISTORY: Amy Leonard 52 y.o. female returns for followup visit prior to [employer].  Her skin is much improved since taking the steroid taper.  She is doing well.  She denies fevers, chills, nausea, vomiting, constipation, diarrhea.  Her numbness is now intermittent and mild.  The swelling in her lower extremities she believes coincides with the timing of starting Neurontin.  She would like to taper it off.  Otherwise, a 10 point ROS is neg.   MEDICAL HISTORY: Past Medical History  Diagnosis Date  . Elevated WBC count     nl diff  . Chronic back  pain     spinal stenosis  . Tobacco abuse   . Anxiety and depression   . HSV infection     recurrent (side and buttox)  . Migraine   . Glaucoma     ?  . Carpal tunnel syndrome   . HLD (hyperlipidemia)   . Folliculitis 05/05/2011  . Anxiety   . Depression   . 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  GERD (gastroesophageal reflux disease)   . Hypertension   . Breast cancer   . Wears dentures     top    ALLERGIES:  is allergic to bupropion hcl; chlorhexidine gluconate; hydrocod polst-cpm polst er; lisinopril; and naproxen sodium.  MEDICATIONS:  Current Outpatient Prescriptions  Medication Sig Dispense Refill  . Acetaminophen (TYLENOL ARTHRITIS PAIN PO) Take 2 tablets by mouth as needed (pain).       Marland Kitchen acyclovir ointment (ZOVIRAX) 5 % Apply 1 application topically as needed (flare-up).  15 g  1  . aspirin 81 MG chewable tablet Chew 81 mg by mouth daily.      . ATIVAN 0.5 MG tablet Take 1 tablet (0.5 mg total) by mouth every 6 (six) hours as needed (Nausea or vomiting).  45 tablet  0  . atorvastatin (LIPITOR) 10 MG tablet TAKE 1 TABLET (10 MG TOTAL) BY MOUTH DAILY.  90 tablet  0  . calcium carbonate (TUMS - DOSED IN MG ELEMENTAL CALCIUM) 500 MG chewable tablet Chew by mouth as directed.        . ciprofloxacin (CIPRO) 500 MG tablet Take 1 tablet by mouth 2 (two) times daily.      . COMPRO 25 MG suppository       . cyclobenzaprine (FLEXERIL) 10 MG tablet Take 10 mg by mouth as needed (muscle spasm).       Marland Kitchen dexamethasone (DECADRON) 4 MG tablet Take 1 tablet (4 mg total) by mouth as directed. 2 tablets PO the day after chemo then 2 tablets PO BID x 2 days.  30 tablet  4  . fluconazole (DIFLUCAN) 100 MG tablet Take 2 tablets (200 mg total) by mouth daily.  14 tablet  0  . gabapentin (NEURONTIN) 100 MG capsule Take 1 capsule (100 mg total) by mouth 3 (three) times daily.  90 capsule  0  . KLOR-CON M20 20 MEQ tablet Take 1 tablet by mouth daily.      Marland Kitchen lidocaine-prilocaine (EMLA) cream Apply topically as needed.  30 g  8  . losartan (COZAAR) 50 MG tablet Take 1 tablet (50 mg total) by mouth daily.  90 tablet  2  . nicotine (NICODERM CQ - DOSED IN MG/24 HOURS) 21 mg/24hr patch Place 1 patch onto the skin daily.  28 patch  0  . nystatin (MYCOSTATIN) 100000 UNIT/ML suspension Take 5 mLs (500,000  Units total) by mouth 2 (two) times daily.  240 mL  3  . omeprazole (PRILOSEC) 40 MG capsule Take 1 capsule (40 mg total) by mouth 2 (two) times daily.  180 capsule  3  . ondansetron (ZOFRAN) 8 MG tablet       . prochlorperazine (COMPAZINE) 10 MG tablet       . simethicone (MYLICON) 80 MG chewable tablet Chew 1 tablet (80 mg total) by mouth every 8 (eight) hours as needed for flatulence.  60 tablet  3  . UNABLE TO FIND Rx: L8000-Mastectomy Bra (Quantity: 2) Dx: 174.9 Bilateral Lumpectomy  1 each  0  . tobramycin-dexamethasone (TOBRADEX) ophthalmic solution PLACE 1 DROP  INTO BOTH EYES EVERY 4 (FOUR) HOURS WHILE AWAKE.  10 mL  0   No current facility-administered medications for this visit.   Facility-Administered Medications Ordered in Other Visits  Medication Dose Route Frequency Provider Last Rate Last Dose  . pegfilgrastim (NEULASTA) injection 6 mg  6 mg Subcutaneous Once Augustin Schooling, NP        SURGICAL HISTORY:  Past Surgical History  Procedure Laterality Date  . Btl    . Epidural steroid injection  2008  . Colonoscopy    . Upper gi endoscopy    . Partial mastectomy with needle localization and axillary sentinel lymph node bx Bilateral 11/23/2012    Procedure: PARTIAL MASTECTOMY WITH NEEDLE LOCALIZATION AND AXILLARY SENTINEL LYMPH NODE BX;  Surgeon: Ernestene Mention, MD;  Location: [facility];  Service: General;  Laterality: Bilateral;  . Portacath placement Right 11/23/2012    Procedure: INSERTION PORT-A-CATH;  Surgeon: Ernestene Mention, MD;  Location: [facility];  Service: General;  Laterality: Right;    REVIEW OF SYSTEMS:  Pertinent items are noted in HPI.   PHYSICAL EXAMINATION: There were no vitals taken for this visit. There is no weight on file to calculate BMI. General: Patient is a well appearing female in no acute distress HEENT: PERRLA, no scleral injection, crusting, or puffiness of eyes noted.  sclerae anicteric no conjunctival  pallor, MMM Neck: supple, no palpable adenopathy Lungs: clear to auscultation bilaterally, no wheezes, rhonchi, or rales Cardiovascular: regular rhythm, S1, S2, no murmurs, rubs or gallops Abdomen: Soft, non-tender, non-distended, normoactive bowel sounds, no HSM Extremities: warm and well perfused, 1+ pitting edema bilaterally Skin: No rashes or lesions, rash on wrist healing Neuro: Non-focal Breasts: Bilateral lumpectomy sites without nodularity, no masses or skin changes in either breast. ECOG PERFORMANCE STATUS: 0 - Asymptomatic  LABORATORY DATA: Lab Results  Component Value Date   WBC 3.9 04/28/2013   HGB 11.3* 04/28/2013   HCT 33.6* 04/28/2013   MCV 104.0* 04/28/2013   PLT 198 04/28/2013      Chemistry      Component Value Date/Time   NA 143 04/28/2013 0851   NA 135 01/08/2013 0555   K 3.5 04/28/2013 0851   K 4.0 01/08/2013 0555   CL 109* 02/17/2013 1120   CL 100 01/08/2013 0555   CO2 23 04/28/2013 0851   CO2 28 01/08/2013 0555   BUN 11.7 04/28/2013 0851   BUN 10 01/08/2013 0555   CREATININE 0.6 04/28/2013 0851   CREATININE 0.62 01/08/2013 0555      Component Value Date/Time   CALCIUM 8.4 04/28/2013 0851   CALCIUM 9.1 01/08/2013 0555   ALKPHOS 49 04/28/2013 0851   ALKPHOS 67 01/05/2013 2245   AST 21 04/28/2013 0851   AST 10 01/05/2013 2245   ALT 34 04/28/2013 0851   ALT 15 01/05/2013 2245   BILITOT 0.32 04/28/2013 0851   BILITOT 0.5 01/05/2013 2245    ADDITIONAL INFORMATION: 1. CHROMOGENIC IN-SITU HYBRIDIZATION Results: HER-2/NEU BY CISH - NO AMPLIFICATION OF HER-2 DETECTED. RESULT RATIO OF HER2: CEP 17 SIGNALS 1.00 AVERAGE HER2 COPY NUMBER PER CELL 1.55 REFERENCE RANGE NEGATIVE HER2/Chr17 Ratio <2.0 and Average HER2 copy number <4.0 EQUIVOCAL HER2/Chr17 Ratio <2.0 and Average HER2 copy number 4.0 and <6.0 POSITIVE HER2/Chr17 Ratio >=2.0 and/or Average HER2 copy number >=6.0 Pecola Leisure MD Pathologist, Electronic Signature ( Signed 12/01/2012) 3. CHROMOGENIC IN-SITU  HYBRIDIZATION Results: HER-2/NEU BY CISH - NO AMPLIFICATION OF HER-2 DETECTED. RESULT RATIO OF HER2: CEP 17 SIGNALS  1.22 AVERAGE HER2 COPY NUMBER PER CELL 1.40 REFERENCE RANGE NEGATIVE HER2/Chr17 Ratio <2.0 and Average HER2 copy number <4.0 EQUIVOCAL HER2/Chr17 Ratio <2.0 and Average HER2 copy number 4.0 and <6.0 POSITIVE HER2/Chr17 Ratio >=2.0 and/or Average HER2 copy number >=6.0 1 of 5 FINAL for DEBORH, PENSE ([id number]) ADDITIONAL INFORMATION:(continued) Pecola Leisure MD Pathologist, Electronic Signature ( Signed 12/01/2012) FINAL DIAGNOSIS Diagnosis 1. Breast, lumpectomy, Right - INVASIVE DUCTAL CARCINOMA, GRADE III, SEE COMMENT. - INVASIVE CARCINOMA IS 0.9 CM FROM NEAREST MARGIN (INFERIOR). - NO LYMPHOVASCULAR INVASION IDENTIFIED. - HIGH GRADE DUCTAL CARCINOMA IN SITU - IN SITU CARCINOMA IS 1 MM FROM THE NEAREST LATERAL MARGIN - SEE TUMOR SYNOPTIC TEMPLATE BELOW. 2. Lymph nodes, regional resection, Right axilla - ELEVEN LYMPH NODES, NEGATIVE FOR TUMOR (0/11). 3. Breast, lumpectomy, Left - INVASIVE DUCTAL CARCINOMA, GRADE I, SEE COMMENT. - LYMPHOVASCULAR INVASION IDENTIFIED - INVASIVE CARCINOMA IS BROADLY PRESENT AT POSTERIOR MARGIN - DUCTAL CARCINOMA IN SITU, GRADE I. - IN SITU CARCINOMA IS FOCALLY PRESENT AT POSTERIOR MARGIN - SEE TUMOR SYNOPTIC TEMPLATE BELOW. 4. Breast, excision, Left - BENIGN BREAST TISSUE, SEE COMMENT. - NEGATIVE FOR ATYPIA OR MALIGNANCY. - SURGICAL MARGIN, NEGATIVE FOR ATYPIA OR MALIGNANCY. 5. Lymph node, sentinel, biopsy, Left axillary - ONE LYMPH NODE, POSITIVE FOR METASTATIC MAMMARY CARCINOMA (1/1). Microscopic Comment 1. BREAST, INVASIVE TUMOR, WITH LYMPH NODE SAMPLING Specimen, including laterality: Right breast Procedure: Lumpectomy Grade: III of III Tubule formation: 3 Nuclear pleomorphism: 3 Mitotic: 3 Tumor size (gross measurement: 1.5 cm Margins: Invasive, distance to closest margin: 0.9 cm In-situ, distance to closest  margin: 0.1 cm If margin positive, focally or broadly: N/A Lymphovascular invasion: Absent Ductal carcinoma in situ: Present Grade: III of III Extensive intraductal component: Absent Lobular neoplasia: Absent Tumor focality: Multifocal Treatment effect: None 2 of 5 FINAL for CARILYN, WOOLSTON ([id number]) Microscopic Comment(continued) If present, treatment effect in breast tissue, lymph nodes or both: N/A Extent of tumor: Skin: N/A Nipple: N/A Skeletal muscle: N/A Lymph nodes: # examined: 11 (Part 2) Lymph nodes with metastasis: 0 Breast prognostic profile: Estrogen receptor: Repeated, previous study demonstrated 0% positivity ([id number]) Progesterone receptor: Repeated, previous study demonstrated 0% positivity ([id number]) Her 2 neu: Repeated, previous study demonstrated no amplification (1.38) ([id number]) Ki-67: Not repeated, previous study demonstrated 92% proliferation rate ([id number]) Non-neoplastic breast: Previous biopsy site, fibrocystic change, sclerosing adenosis and benign calcifications in benign ducts and lobules. TNM: pT1c, pN0, pMX Comments: There are a total of four distinct areas of abnormality identified. The first 1.5 cm corresponds to the invasive ductal carcinoma. The second 0.9 cm area consists of high grade ductal carcinoma in situ with associated fibrocystic change and fibroadenomatoid nodules. In this focus, the in situ carcinoma is 1 mm from the nearest lateral margin. The third 0.4 cm area consists of fat necrosis and fibrosis. There are no atypical or malignant findings in this area. The fourth and final 0.3 cm area demonstrate non-neoplastic findings to include fibrocystic change. There are no atypical or malignant epithelial findings in this area either. 3. BREAST, INVASIVE TUMOR, WITH LYMPH NODE SAMPLING Specimen, including laterality: Left breast Procedure: Lumpectomy Grade: I of III Tubule formation: 3 Nuclear pleomorphism: 3 Mitotic:  1 Tumor size (gross measurement): 1.0 cm Margins: Invasive, distance to closest margin: Present at posterior In-situ, distance to closest margin: Present at posterior If margin positive, focally or broadly: Broadly (invasive) and focally (in situ) Lymphovascular invasion: Present Ductal carcinoma in situ: Present Grade: I of III Extensive intraductal component: Absent Lobular neoplasia: Absent Tumor focality:  Unifocal Treatment effect: None If present, treatment effect in breast tissue, lymph nodes or both: N/A Extent of tumor: Skin: N/A Nipple: N/A Skeletal muscle: N/A Lymph nodes: # examined: 1 (Part 5) Lymph nodes with metastasis: 1 Isolated tumor cells (< 0.2 mm): 0 Micrometastasis: (> 0.2 mm and < 2.0 mm): 1 Macrometastasis: (> 2.0 mm): 0 3 of 5 FINAL for TAMECIA, MCDOUGALD ([id number]) Microscopic Comment(continued) Extracapsular extension: Present Breast prognostic profile: Estrogen receptor: Not repeated, previous study demonstrated 100% positivity ([id number]). Progesterone receptor: Not repeated, previous study demonstrated 25% positivity ([id number]) Her 2 neu: Repeated, previous study demonstrated no amplification (1.42) ([id number]) Ki-67: Not repeated, previous study demonstrated 9% proliferation rate ([id number]) Non-neoplastic breast: Previous biopsy site, fibrocystic change and microcalcifications in benign ducts and lobules. TNM: pT1b, pN60mi, PMX Comments: The additional 1.0 cm and 1.1 cm hemorrhagic areas grossly identified consist of benign breast tissue with microcalcifications and parenchymal hemorrhage. There are no atypical or malignant findings in these areas. 4. The surgical resection margin(s) of the specimen were inked and microscopically evaluated. There is no mass grossly identified. Representative sections demonstrate predominantly benign fibrovascular and adipose tissue with focal areas of fibrocytic change and sclerosing adenosis. There are no  atypical or malignant epithelial or stromal findings identified. 5. There are microscopic foci of intranodal metastatic mammary tumor deposits spanning up to 1 mm. There is focal extracapsular extension identified. (CR:kh 11-25-12) [place] RUND DO Pathologist, Electronic Signature (Case signed 11/25/2012) Specimen Gross and Clinical Information   RADIOGRAPHIC STUDIES:  Chest 2 View  11/19/2012  *RADIOLOGY REPORT*  Clinical Data: Preop for breast cancer  CHEST - 2 VIEW  Comparison: None.  Findings: Cardiomediastinal silhouette is unremarkable.  No acute infiltrate or pleural effusion.  No pulmonary edema.  Bony thorax is unremarkable.  IMPRESSION: No active disease.   Original Report Authenticated By: Natasha Mead, M.D.    Nm Sentinel Node Inj-no Rpt (breast)  11/23/2012  CLINICAL DATA: Bilateral breast cancer   Sulfur colloid was injected intradermally by the nuclear medicine  technologist for breast cancer sentinel node localization.     Dg Chest Portable 1 View  11/23/2012  *RADIOLOGY REPORT*  Clinical Data: 52 year old female status post Port-A-Cath placement.  Breast cancer.  PORTABLE CHEST - 1 VIEW  Comparison: 11/19/2012.  Findings: Portable semi upright AP view 1441 hours.  Right chest subclavian approach Port-A-Cath placed.  Acute angulation of the catheter tubing at the level of the right axilla.  Tip at the level of the cavoatrial junction.  Interval postoperative changes to the right chest wall with subcutaneous appearing drain in place.  No pneumothorax.  No pleural effusion or pulmonary edema.  Mildly increased bibasilar opacity compatible with atelectasis.  Cardiac size and mediastinal contours are within normal limits.  Visualized tracheal air column is within normal limits.  IMPRESSION: 1.  Right subclavian approach chest Port-A-Cath in place.  Tip at the level of the cavoatrial junction.  Mildly angulated catheter tubing at the level of the axilla. 2.  Interval postoperative changes to  the right chest wall. No pneumothorax. 3.  Mild dependent atelectasis.   Original Report Authenticated By: Erskine Speed, M.D.    Dg Fluoro Guide Cv Line-no Report  11/23/2012  CLINICAL DATA: bil breast cancer   FLOURO GUIDE CV LINE  Fluoroscopy was utilized by the requesting physician.  No radiographic  interpretation.     Mm Lt Plc Breast Loc Dev   1st Lesion  Inc Mammo Guide  11/23/2012  *RADIOLOGY REPORT*  Clinical  Data:  Left breast cancer  NEEDLE LOCALIZATION WITH MAMMOGRAPHIC GUIDANCE AND SPECIMEN RADIOGRAPH  Comparison:  Previous exams.  Patient presents for needle localization prior to surgery.  I met with the patient and we discussed the procedure of needle localization including benefits and alternatives. We discussed the high likelihood of a successful procedure. We discussed the risks of the procedure, including infection, bleeding, tissue injury, and further surgery. Informed, written consent was given.  Using mammographic guidance, sterile technique, 2% lidocaine and a #9 modified Kopans needle, the mass and clip in the central aspect of the left breast was localized using a superior approach.  The films are marked for Dr. Derrell Lolling.  Specimen radiograph was performed at Day [facility], and confirms the mass and clip were present in the tissue sample. The clip was located at the margin of the surgical specimen.  Dr.  Derrell Lolling states she had taken more tissue at the margin to clear the border.  The specimen is marked for pathology.  IMPRESSION: Needle localization of the left breast.  No apparent complications.   Original Report Authenticated By: Baird Lyons, M.D.    Mm Rt Plc Breast Loc Dev   1st Lesion  Inc Mammo Guide  11/23/2012  *RADIOLOGY REPORT*  Clinical Data:  Known right breast cancer  NEEDLE LOCALIZATION WITH MAMMOGRAPHIC GUIDANCE AND SPECIMEN RADIOGRAPH  Comparison:  Previous exams.  Patient presents for needle localization prior to surgery.  I met with the patient and we discussed the  procedure of needle localization including benefits and alternatives. We discussed the high likelihood of a successful procedure. We discussed the risks of the procedure, including infection, bleeding, tissue injury, and further surgery. Informed, written consent was given.  Using mammographic guidance, sterile technique, 2% lidocaine and a #7 and #9 modified Kopans needles, the mass and nodularity in the upper outer quadrant of the right breast was bracketedusing a lateral approach.  The films are marked for Dr. Derrell Lolling.  Specimen radiograph was performed at Day Surgery, and confirms the mass and clip are present in the tissue sample.  The specimen is marked for pathology.  IMPRESSION: Needle localization of the right breast.  No apparent complications.   Original Report Authenticated By: Baird Lyons, M.D.     ASSESSMENT: 52 year old female with  #1 bilateral breast cancers triple negative on the right side ER positive on the left side. She is status post bilateral lumpectomies. Clinically she's doing well. She will receive Adriamycin Cytoxan given dose dense x4 cycles followed by Taxol carbo x12 cycles.  Taxol Carbo was discontinued after 6 cycles due to progressive neuropathy and hand foot syndrome.  Patient was then started on Gemzar Carbo every 2 weeks Cycle 1 on 04/28/13 with day 2 neulasta.  #2 Neuropathy.  #3 Blepharitis   PLAN:   #1 Doing well. Hand foot syndrome is much improved after steroid taper.  Patient will proceed with Gemzar Carbo today.  Dr. Welton Flakes has reviewed the chemotherapy orders and plan.  Patient will receive 3 cycles of Carbo/Gemzar q14 days with Neulasta support.    #2 She will titrate off the neurontin due to the swelling and continue to take Super B complex daily.    #3 The eyes are improved with the Tobradex eye gtts.     #4 She is working with [employer] supply in [place], [place] to get her sleeves and gloves ordered.  I gave her a script for this today.       #5  Patient will return next  week for labs and an appointment.   All questions were answered. The patient knows to call the clinic with any problems, questions or concerns. We can certainly see the patient much sooner if necessary.  I spent 25 minutes counseling the patient face to face. The total time spent in the appointment was 30 minutes.  Cherie Ouch Lyn Hollingshead, NP Medical Oncology [facility] Phone: [phone] 04/29/2013, 10:15 AM

## 2013-04-28 NOTE — Patient Instructions (Signed)
Carboplatin injection What is this medicine? CARBOPLATIN (KAR boe pla tin) is a chemotherapy drug. It targets fast dividing cells, like cancer cells, and causes these cells to die. This medicine is used to treat ovarian cancer and many other cancers. This medicine may be used for other purposes; ask your health care provider or pharmacist if you have questions. What should I tell my health care provider before I take this medicine? They need to know if you have any of these conditions: -blood disorders -hearing problems -kidney disease -recent or ongoing radiation therapy -an unusual or allergic reaction to carboplatin, cisplatin, other chemotherapy, other medicines, foods, dyes, or preservatives -pregnant or trying to get pregnant -breast-feeding How should I use this medicine? This drug is usually given as an infusion into a vein. It is administered in a hospital or clinic by a specially trained health care professional. Talk to your pediatrician regarding the use of this medicine in children. Special care may be needed. Overdosage: If you think you have taken too much of this medicine contact a poison control center or emergency room at once. NOTE: This medicine is only for you. Do not share this medicine with others. What if I miss a dose? It is important not to miss a dose. Call your doctor or health care professional if you are unable to keep an appointment. What may interact with this medicine? -medicines for seizures -medicines to increase blood counts like filgrastim, pegfilgrastim, sargramostim -some antibiotics like amikacin, gentamicin, neomycin, streptomycin, tobramycin -vaccines Talk to your doctor or health care professional before taking any of these medicines: -acetaminophen -aspirin -ibuprofen -ketoprofen -naproxen This list may not describe all possible interactions. Give your health care provider a list of all the medicines, herbs, non-prescription drugs, or dietary  supplements you use. Also tell them if you smoke, drink alcohol, or use illegal drugs. Some items may interact with your medicine. What should I watch for while using this medicine? Your condition will be monitored carefully while you are receiving this medicine. You will need important blood work done while you are taking this medicine. This drug may make you feel generally unwell. This is not uncommon, as chemotherapy can affect healthy cells as well as cancer cells. Report any side effects. Continue your course of treatment even though you feel ill unless your doctor tells you to stop. In some cases, you may be given additional medicines to help with side effects. Follow all directions for their use. Call your doctor or health care professional for advice if you get a fever, chills or sore throat, or other symptoms of a cold or flu. Do not treat yourself. This drug decreases your body's ability to fight infections. Try to avoid being around people who are sick. This medicine may increase your risk to bruise or bleed. Call your doctor or health care professional if you notice any unusual bleeding. Be careful brushing and flossing your teeth or using a toothpick because you may get an infection or bleed more easily. If you have any dental work done, tell your dentist you are receiving this medicine. Avoid taking products that contain aspirin, acetaminophen, ibuprofen, naproxen, or ketoprofen unless instructed by your doctor. These medicines may hide a fever. Do not become pregnant while taking this medicine. Women should inform their doctor if they wish to become pregnant or think they might be pregnant. There is a potential for serious side effects to an unborn child. Talk to your health care professional or pharmacist for more information.   Do not breast-feed an infant while taking this medicine. What side effects may I notice from receiving this medicine? Side effects that you should report to your  doctor or health care professional as soon as possible: -allergic reactions like skin rash, itching or hives, swelling of the face, lips, or tongue -signs of infection - fever or chills, cough, sore throat, pain or difficulty passing urine -signs of decreased platelets or bleeding - bruising, pinpoint red spots on the skin, black, tarry stools, nosebleeds -signs of decreased red blood cells - unusually weak or tired, fainting spells, lightheadedness -breathing problems -changes in hearing -changes in vision -chest pain -high blood pressure -low blood counts - This drug may decrease the number of white blood cells, red blood cells and platelets. You may be at increased risk for infections and bleeding. -nausea and vomiting -pain, swelling, redness or irritation at the injection site -pain, tingling, numbness in the hands or feet -problems with balance, talking, walking -trouble passing urine or change in the amount of urine Side effects that usually do not require medical attention (report to your doctor or health care professional if they continue or are bothersome): -hair loss -loss of appetite -metallic taste in the mouth or changes in taste This list may not describe all possible side effects. Call your doctor for medical advice about side effects. You may report side effects to FDA at 1-800-FDA-1088. Where should I keep my medicine? This drug is given in a hospital or clinic and will not be stored at home. NOTE: This sheet is a summary. It may not cover all possible information. If you have questions about this medicine, talk to your doctor, pharmacist, or health care provider.  2012, [employer]. (11/17/2007 2:38:05 PM)Gemcitabine injection What is this medicine? GEMCITABINE (jem SIT a been) is a chemotherapy drug. This medicine is used to treat many types of cancer like breast cancer, lung cancer, pancreatic cancer, and ovarian cancer. This medicine may be used for other  purposes; ask your health care provider or pharmacist if you have questions. What should I tell my health care provider before I take this medicine? They need to know if you have any of these conditions: -blood disorders -infection -kidney disease -liver disease -recent or ongoing radiation therapy -an unusual or allergic reaction to gemcitabine, other chemotherapy, other medicines, foods, dyes, or preservatives -pregnant or trying to get pregnant -breast-feeding How should I use this medicine? This drug is given as an infusion into a vein. It is administered in a hospital or clinic by a specially trained health care professional. Talk to your pediatrician regarding the use of this medicine in children. Special care may be needed. Overdosage: If you think you have taken too much of this medicine contact a poison control center or emergency room at once. NOTE: This medicine is only for you. Do not share this medicine with others. What if I miss a dose? It is important not to miss your dose. Call your doctor or health care professional if you are unable to keep an appointment. What may interact with this medicine? -medicines to increase blood counts like filgrastim, pegfilgrastim, sargramostim -some other chemotherapy drugs like cisplatin -vaccines Talk to your doctor or health care professional before taking any of these medicines: -acetaminophen -aspirin -ibuprofen -ketoprofen -naproxen This list may not describe all possible interactions. Give your health care provider a list of all the medicines, herbs, non-prescription drugs, or dietary supplements you use. Also tell them if you smoke, drink alcohol, or   use illegal drugs. Some items may interact with your medicine. What should I watch for while using this medicine? Visit your doctor for checks on your progress. This drug may make you feel generally unwell. This is not uncommon, as chemotherapy can affect healthy cells as well as  cancer cells. Report any side effects. Continue your course of treatment even though you feel ill unless your doctor tells you to stop. In some cases, you may be given additional medicines to help with side effects. Follow all directions for their use. Call your doctor or health care professional for advice if you get a fever, chills or sore throat, or other symptoms of a cold or flu. Do not treat yourself. This drug decreases your body's ability to fight infections. Try to avoid being around people who are sick. This medicine may increase your risk to bruise or bleed. Call your doctor or health care professional if you notice any unusual bleeding. Be careful brushing and flossing your teeth or using a toothpick because you may get an infection or bleed more easily. If you have any dental work done, tell your dentist you are receiving this medicine. Avoid taking products that contain aspirin, acetaminophen, ibuprofen, naproxen, or ketoprofen unless instructed by your doctor. These medicines may hide a fever. Women should inform their doctor if they wish to become pregnant or think they might be pregnant. There is a potential for serious side effects to an unborn child. Talk to your health care professional or pharmacist for more information. Do not breast-feed an infant while taking this medicine. What side effects may I notice from receiving this medicine? Side effects that you should report to your doctor or health care professional as soon as possible: -allergic reactions like skin rash, itching or hives, swelling of the face, lips, or tongue -low blood counts - this medicine may decrease the number of white blood cells, red blood cells and platelets. You may be at increased risk for infections and bleeding. -signs of infection - fever or chills, cough, sore throat, pain or difficulty passing urine -signs of decreased platelets or bleeding - bruising, pinpoint red spots on the skin, black, tarry  stools, blood in the urine -signs of decreased red blood cells - unusually weak or tired, fainting spells, lightheadedness -breathing problems -chest pain -mouth sores -nausea and vomiting -pain, swelling, redness at site where injected -pain, tingling, numbness in the hands or feet -stomach pain -swelling of ankles, feet, hands -unusual bleeding Side effects that usually do not require medical attention (report to your doctor or health care professional if they continue or are bothersome): -constipation -diarrhea -hair loss -loss of appetite -stomach upset This list may not describe all possible side effects. Call your doctor for medical advice about side effects. You may report side effects to FDA at 1-800-FDA-1088. Where should I keep my medicine? This drug is given in a hospital or clinic and will not be stored at home. NOTE: This sheet is a summary. It may not cover all possible information. If you have questions about this medicine, talk to your doctor, pharmacist, or health care provider.  2013, [employer]. (12/22/2007 6:45:54 PM)  

## 2013-04-28 NOTE — Patient Instructions (Addendum)
[facility] Discharge Instructions for Patients Receiving Chemotherapy  Today you received the following chemotherapy agents Gemzar/Carboplatin.  To help prevent nausea and vomiting after your treatment, we encourage you to take your nausea medication as prescribed.   If you develop nausea and vomiting that is not controlled by your nausea medication, call the clinic.   BELOW ARE SYMPTOMS THAT SHOULD BE REPORTED IMMEDIATELY:  *FEVER GREATER THAN 100.5 F  *CHILLS WITH OR WITHOUT FEVER  NAUSEA AND VOMITING THAT IS NOT CONTROLLED WITH YOUR NAUSEA MEDICATION  *UNUSUAL SHORTNESS OF BREATH  *UNUSUAL BRUISING OR BLEEDING  TENDERNESS IN MOUTH AND THROAT WITH OR WITHOUT PRESENCE OF ULCERS  *URINARY PROBLEMS  *BOWEL PROBLEMS  UNUSUAL RASH Items with * indicate a potential emergency and should be followed up as soon as possible.  Feel free to call the clinic you have any questions or concerns. The clinic phone number is [phone].    

## 2013-04-29 ENCOUNTER — Ambulatory Visit (HOSPITAL_BASED_OUTPATIENT_CLINIC_OR_DEPARTMENT_OTHER): Payer: BC Managed Care – PPO

## 2013-04-29 ENCOUNTER — Encounter: Payer: Self-pay | Admitting: *Deleted

## 2013-04-29 ENCOUNTER — Telehealth: Payer: Self-pay | Admitting: *Deleted

## 2013-04-29 VITALS — BP 155/82 | HR 105 | Temp 98.7°F

## 2013-04-29 DIAGNOSIS — C50419 Malignant neoplasm of upper-outer quadrant of unspecified female breast: Secondary | ICD-10-CM

## 2013-04-29 DIAGNOSIS — Z5189 Encounter for other specified aftercare: Secondary | ICD-10-CM

## 2013-04-29 DIAGNOSIS — C50919 Malignant neoplasm of unspecified site of unspecified female breast: Secondary | ICD-10-CM

## 2013-04-29 MED ORDER — PEGFILGRASTIM INJECTION 6 MG/0.6ML
6.0000 mg | Freq: Once | SUBCUTANEOUS | Status: AC
  Administered 2013-04-29: 6 mg via SUBCUTANEOUS
  Filled 2013-04-29: qty 0.6

## 2013-04-29 NOTE — Progress Notes (Signed)
[facility] Healthcare Advance Directives Clinical Social Work  Clinical Social Work was referred by patient to review and complete healthcare advance directives.  Clinical Social Worker met with patient and CSW [place] in CSW office.  The patient designated daughter Ruben Im as their primary healthcare agent.  Patient also completed healthcare living will.    Clinical Social Worker notarized documents and made copies for patient/family. Clinical Social Worker will send documents to medical records to be scanned into patient's chart. Clinical Social Worker encouraged patient/family to contact with any additional questions or concerns.  Kathrin Penner, MSW, LCSW Clinical Social Worker [facility] [phone]

## 2013-04-29 NOTE — Telephone Encounter (Signed)
Bree here for Neulasta injection following 1st time gem/carbo chemo treatment--change in meds.  States that she is doing well  No nausea, vomiting or diarrhea.  Knows to call if she has any problems or concerns.

## 2013-05-05 ENCOUNTER — Ambulatory Visit (HOSPITAL_BASED_OUTPATIENT_CLINIC_OR_DEPARTMENT_OTHER): Payer: BC Managed Care – PPO | Admitting: Adult Health

## 2013-05-05 ENCOUNTER — Telehealth: Payer: Self-pay | Admitting: *Deleted

## 2013-05-05 ENCOUNTER — Encounter: Payer: Self-pay | Admitting: Adult Health

## 2013-05-05 ENCOUNTER — Other Ambulatory Visit (HOSPITAL_BASED_OUTPATIENT_CLINIC_OR_DEPARTMENT_OTHER): Payer: BC Managed Care – PPO | Admitting: Lab

## 2013-05-05 ENCOUNTER — Telehealth: Payer: Self-pay | Admitting: Oncology

## 2013-05-05 VITALS — BP 146/90 | HR 102 | Temp 98.1°F | Resp 18 | Ht 63.0 in | Wt 190.6 lb

## 2013-05-05 DIAGNOSIS — C50411 Malignant neoplasm of upper-outer quadrant of right female breast: Secondary | ICD-10-CM

## 2013-05-05 DIAGNOSIS — C50919 Malignant neoplasm of unspecified site of unspecified female breast: Secondary | ICD-10-CM

## 2013-05-05 DIAGNOSIS — C50419 Malignant neoplasm of upper-outer quadrant of unspecified female breast: Secondary | ICD-10-CM

## 2013-05-05 LAB — CBC WITH DIFFERENTIAL/PLATELET
Eosinophils Absolute: 0 10*3/uL (ref 0.0–0.5)
MCV: 104.2 fL — ABNORMAL HIGH (ref 79.5–101.0)
MONO#: 0.2 10*3/uL (ref 0.1–0.9)
MONO%: 4.6 % (ref 0.0–14.0)
NEUT#: 3.1 10*3/uL (ref 1.5–6.5)
RBC: 2.93 10*6/uL — ABNORMAL LOW (ref 3.70–5.45)
RDW: 16 % — ABNORMAL HIGH (ref 11.2–14.5)
WBC: 4.4 10*3/uL (ref 3.9–10.3)

## 2013-05-05 LAB — COMPREHENSIVE METABOLIC PANEL (CC13)
ALT: 40 U/L (ref 0–55)
Albumin: 3.2 g/dL — ABNORMAL LOW (ref 3.5–5.0)
Alkaline Phosphatase: 74 U/L (ref 40–150)
Glucose: 109 mg/dl (ref 70–140)
Potassium: 3.6 mEq/L (ref 3.5–5.1)
Sodium: 137 mEq/L (ref 136–145)
Total Protein: 5.8 g/dL — ABNORMAL LOW (ref 6.4–8.3)

## 2013-05-05 NOTE — Telephone Encounter (Signed)
, °

## 2013-05-05 NOTE — Patient Instructions (Addendum)
Doing well.  Labs are stable.  Use saline nasal spray twice a day.  Continue to use magic mouthwash and saline rinses after every meal and after bedtime.  Please call us if you have any questions or concerns.  We will see you back next week.

## 2013-05-05 NOTE — Telephone Encounter (Signed)
Per staff message and POF I have scheduled appts.  JMW  

## 2013-05-05 NOTE — Progress Notes (Signed)
OFFICE PROGRESS NOTE  CC  [name], MD [address] [place] [address], [place] [address] Dr. Claud Kelp  Dr. Antony Blackbird  DIAGNOSIS: 52 year old female with new diagnosis of bilateral breast cancers.   STAGE:  Cancer of upper-outer quadrant of female breast  Primary site: Breast (Bilateral)  Staging method: AJCC 7th Edition  Clinical: Stage IA (T1c, N0, cM0)  Summary: Stage IA (T1c, N0, cM0)   PRIOR THERAPY: #1Patient recently underwent screening mammograms and she was found to have bilateral breast abnormalities.diagnostic mammogram showed in the right breast an ill-defined 2 cm mass with a few associated well defined microcalcifications over the upper outer quadrant. Spot compression images of the left breast demonstrated an ill defined spiculated 8 mm mass centrally. Ultrasound showed irregular bordered heterogeneous hypoechoic solid mass at the 10:00 position of the right breast 6 cm from the nipple measuring 1.1 x 1.3 x 1.4 cm. Also in this location was an adjacent smaller irregular hypoechoic mass measuring 3 x 5 x 6 mm. The smaller mass was 1 cm from the large more irregular mass. Ultrasound of the axilla demonstrated single lymph node with thickened cortex. Ultrasound of the left breast demonstrated an ill-defined hypoechoic mass with distal acoustic shadowing 7 to 8:00 position 3 cm from the nipple located deep and measuring 5 x 8 x 8 mm ultrasound of the left axilla showed normal appearing lymph nodes.   #2Patient went on to have needle core biopsies performed of both of the masses. 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 The tumor was ER positive PR positive with a Ki-67 of 9% the second tumor was ER negative PR negative HER-2/neu negative with Ki-6792% and elevated.  #3 Patient had MRIs of the breasts performed. There was  noted to be 1.7 cm irregular enhancing mass located within the upper-outer quadrant of the right breast with 3 adjacent worrisome enhancing satellite nodules in aggregate the nodules and mass measures 3.8 cm. In the left breast 8 mm irregular enhancing mass located within the central left breast was noted. No evidence of axillary or internal mammary adenopathy.  #4patient is status post bilateral lumpectomies. The right breast revealed a 1.5 cm grade 3 invasive ductal carcinoma ER/PR negative HER-2/neu negative Ki-67 92%. Sentinel node was negative. Left breast showed 1.0 cm grade 3 invasive ductal carcinoma ER/PR positive HER-2/neu negative sentinel node was negative.  #5 patient is to begin adjuvant chemotherapy consisting of Adriamycin Cytoxan every 2 weeks for a total of 4 cycles 12/30/12. This would then be followed by Taxol and carboplatinum weekly for 12 weeks.   She received 6 cycles of Taxol carbo and it was discontinued due to progressive neuropathy and hand foot syndrome.  She was then started on Gemzar Carbo 04/28/13 and will receive 3 cycles of this.  Patient will need adjuvant radiation therapy. Because one of the tumor is ER positive she will also receive antiestrogen therapy.  CURRENT THERAPY: Gemzar Carbo cycle 1 day 8  INTERVAL HISTORY: TYRENE NADER 52 y.o. female returns for followup visit following her first cycle of Gemzar carbo.  She is fatigued.  Her skin is much improved since her steroid taper has completed.  However she does note increased blurred vision since the taper.  She denies any eye drainage or pain.  Her blepharitis has resolved.  She endorses epistaxis for the past week  that occurs when blowing her nose.  She also has formed an ulcer on her upper gum where she places her dentures.  It occasionally bleeds, particularly after she removes her dentures for the day after them being glued in.  Her neuropathy is slightly worse in her fingertips since stopping the neurontin,  however the swelling in her legs has resolved.  She is able to button buttons, however it is painful.  She is taking super b complex daily.  She denies fevers, chills, and otherwise is doing well.    MEDICAL HISTORY: Past Medical History  Diagnosis Date  . Elevated WBC count     nl diff  . Chronic back pain     spinal stenosis  . Tobacco abuse   . Anxiety and depression   . HSV infection     recurrent (side and buttox)  . Migraine   . Glaucoma     ?  . Carpal tunnel syndrome   . HLD (hyperlipidemia)   . Folliculitis 05/05/2011  . Anxiety   . Depression   . 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 GERD (gastroesophageal reflux disease)   . Hypertension   . Breast cancer   . Wears dentures     top    ALLERGIES:  is allergic to bupropion hcl; chlorhexidine gluconate; hydrocod polst-cpm polst er; lisinopril; and naproxen sodium.  MEDICATIONS:  Current Outpatient Prescriptions  Medication Sig Dispense Refill  . Acetaminophen (TYLENOL ARTHRITIS PAIN PO) Take 2 tablets by mouth as needed (pain).       Marland Kitchen acyclovir ointment (ZOVIRAX) 5 % Apply 1 application topically as needed (flare-up).  15 g  1  . aspirin 81 MG chewable tablet Chew 81 mg by mouth daily.      . ATIVAN 0.5 MG tablet Take 1 tablet (0.5 mg total) by mouth every 6 (six) hours as needed (Nausea or vomiting).  45 tablet  0  . atorvastatin (LIPITOR) 10 MG tablet TAKE 1 TABLET (10 MG TOTAL) BY MOUTH DAILY.  90 tablet  0  . calcium carbonate (TUMS - DOSED IN MG ELEMENTAL CALCIUM) 500 MG chewable tablet Chew by mouth as directed.        . ciprofloxacin (CIPRO) 500 MG tablet Take 1 tablet by mouth 2 (two) times daily.      . COMPRO 25 MG suppository       . cyclobenzaprine (FLEXERIL) 10 MG tablet Take 10 mg by mouth as needed (muscle spasm).       Marland Kitchen dexamethasone (DECADRON) 4 MG tablet Take 1 tablet (4 mg total) by mouth as directed. 2 tablets PO the day after chemo then 2 tablets PO BID x 2 days.  30 tablet  4  . fluconazole  (DIFLUCAN) 100 MG tablet Take 2 tablets (200 mg total) by mouth daily.  14 tablet  0  . gabapentin (NEURONTIN) 100 MG capsule Take 1 capsule (100 mg total) by mouth 3 (three) times daily.  90 capsule  0  . KLOR-CON M20 20 MEQ tablet Take 1 tablet by mouth daily.      Marland Kitchen lidocaine-prilocaine (EMLA) cream Apply topically as needed.  30 g  8  . losartan (COZAAR) 50 MG tablet Take 1 tablet (50 mg total) by mouth daily.  90 tablet  2  . nicotine (NICODERM CQ - DOSED IN MG/24 HOURS) 21 mg/24hr patch Place 1 patch onto the skin daily.  28 patch  0  . nystatin (MYCOSTATIN) 100000 UNIT/ML suspension Take 5 mLs (500,000 Units total)  by mouth 2 (two) times daily.  240 mL  3  . omeprazole (PRILOSEC) 40 MG capsule Take 1 capsule (40 mg total) by mouth 2 (two) times daily.  180 capsule  3  . ondansetron (ZOFRAN) 8 MG tablet       . prochlorperazine (COMPAZINE) 10 MG tablet       . simethicone (MYLICON) 80 MG chewable tablet Chew 1 tablet (80 mg total) by mouth every 8 (eight) hours as needed for flatulence.  60 tablet  3  . tobramycin-dexamethasone (TOBRADEX) ophthalmic solution PLACE 1 DROP INTO BOTH EYES EVERY 4 (FOUR) HOURS WHILE AWAKE.  10 mL  0  . UNABLE TO FIND Rx: L8000-Mastectomy Bra (Quantity: 2) Dx: 174.9 Bilateral Lumpectomy  1 each  0   No current facility-administered medications for this visit.    SURGICAL HISTORY:  Past Surgical History  Procedure Laterality Date  . Btl    . Epidural steroid injection  2008  . Colonoscopy    . Upper gi endoscopy    . Partial mastectomy with needle localization and axillary sentinel lymph node bx Bilateral 11/23/2012    Procedure: PARTIAL MASTECTOMY WITH NEEDLE LOCALIZATION AND AXILLARY SENTINEL LYMPH NODE BX;  Surgeon: Ernestene Mention, MD;  Location: [facility];  Service: General;  Laterality: Bilateral;  . Portacath placement Right 11/23/2012    Procedure: INSERTION PORT-A-CATH;  Surgeon: Ernestene Mention, MD;  Location: [facility];  Service: General;  Laterality: Right;    REVIEW OF SYSTEMS:  Pertinent items are noted in HPI.   PHYSICAL EXAMINATION: Blood pressure 146/90, pulse 102, temperature 98.1 F (36.7 C), temperature source Oral, resp. rate 18, height 5\' 3"  (1.6 m), weight 190 lb 9.6 oz (86.456 kg). Body mass index is 33.77 kg/(m^2). General: Patient is a well appearing female in no acute distress HEENT: PERRLA, no scleral injection, crusting on lashes, or periorbital noted.  sclerae anicteric no conjunctival pallor, MMM, small ulcer on right lateral upper palate/gum line Neck: supple, no palpable adenopathy Lungs: clear to auscultation bilaterally, no wheezes, rhonchi, or rales Cardiovascular: regular rhythm, S1, S2, no murmurs, rubs or gallops Abdomen: Soft, non-tender, non-distended, normoactive bowel sounds, no HSM Extremities: warm and well perfused,no edema Skin: No rashes or lesions Neuro: Non-focal Breasts: Bilateral lumpectomy sites without nodularity, no masses or skin changes in either breast. ECOG PERFORMANCE STATUS: 0 - Asymptomatic  LABORATORY DATA: Lab Results  Component Value Date   WBC 4.4 05/05/2013   HGB 10.7* 05/05/2013   HCT 30.5* 05/05/2013   MCV 104.2* 05/05/2013   PLT 60* 05/05/2013      Chemistry      Component Value Date/Time   NA 137 05/05/2013 1004   NA 135 01/08/2013 0555   K 3.6 05/05/2013 1004   K 4.0 01/08/2013 0555   CL 109* 02/17/2013 1120   CL 100 01/08/2013 0555   CO2 26 05/05/2013 1004   CO2 28 01/08/2013 0555   BUN 12.0 05/05/2013 1004   BUN 10 01/08/2013 0555   CREATININE 0.7 05/05/2013 1004   CREATININE 0.62 01/08/2013 0555      Component Value Date/Time   CALCIUM 9.0 05/05/2013 1004   CALCIUM 9.1 01/08/2013 0555   ALKPHOS 74 05/05/2013 1004   ALKPHOS 67 01/05/2013 2245   AST 14 05/05/2013 1004   AST 10 01/05/2013 2245   ALT 40 05/05/2013 1004   ALT 15 01/05/2013 2245   BILITOT 0.39 05/05/2013 1004   BILITOT 0.5 01/05/2013 2245  ADDITIONAL INFORMATION: 1.  CHROMOGENIC IN-SITU HYBRIDIZATION Results: HER-2/NEU BY CISH - NO AMPLIFICATION OF HER-2 DETECTED. RESULT RATIO OF HER2: CEP 17 SIGNALS 1.00 AVERAGE HER2 COPY NUMBER PER CELL 1.55 REFERENCE RANGE NEGATIVE HER2/Chr17 Ratio <2.0 and Average HER2 copy number <4.0 EQUIVOCAL HER2/Chr17 Ratio <2.0 and Average HER2 copy number 4.0 and <6.0 POSITIVE HER2/Chr17 Ratio >=2.0 and/or Average HER2 copy number >=6.0 Pecola Leisure MD Pathologist, Electronic Signature ( Signed 12/01/2012) 3. CHROMOGENIC IN-SITU HYBRIDIZATION Results: HER-2/NEU BY CISH - NO AMPLIFICATION OF HER-2 DETECTED. RESULT RATIO OF HER2: CEP 17 SIGNALS 1.22 AVERAGE HER2 COPY NUMBER PER CELL 1.40 REFERENCE RANGE NEGATIVE HER2/Chr17 Ratio <2.0 and Average HER2 copy number <4.0 EQUIVOCAL HER2/Chr17 Ratio <2.0 and Average HER2 copy number 4.0 and <6.0 POSITIVE HER2/Chr17 Ratio >=2.0 and/or Average HER2 copy number >=6.0 1 of 5 FINAL for CHELBI, HERBER ([id number]) ADDITIONAL INFORMATION:(continued) Pecola Leisure MD Pathologist, Electronic Signature ( Signed 12/01/2012) FINAL DIAGNOSIS Diagnosis 1. Breast, lumpectomy, Right - INVASIVE DUCTAL CARCINOMA, GRADE III, SEE COMMENT. - INVASIVE CARCINOMA IS 0.9 CM FROM NEAREST MARGIN (INFERIOR). - NO LYMPHOVASCULAR INVASION IDENTIFIED. - HIGH GRADE DUCTAL CARCINOMA IN SITU - IN SITU CARCINOMA IS 1 MM FROM THE NEAREST LATERAL MARGIN - SEE TUMOR SYNOPTIC TEMPLATE BELOW. 2. Lymph nodes, regional resection, Right axilla - ELEVEN LYMPH NODES, NEGATIVE FOR TUMOR (0/11). 3. Breast, lumpectomy, Left - INVASIVE DUCTAL CARCINOMA, GRADE I, SEE COMMENT. - LYMPHOVASCULAR INVASION IDENTIFIED - INVASIVE CARCINOMA IS BROADLY PRESENT AT POSTERIOR MARGIN - DUCTAL CARCINOMA IN SITU, GRADE I. - IN SITU CARCINOMA IS FOCALLY PRESENT AT POSTERIOR MARGIN - SEE TUMOR SYNOPTIC TEMPLATE BELOW. 4. Breast, excision, Left - BENIGN BREAST TISSUE, SEE COMMENT. - NEGATIVE FOR ATYPIA OR MALIGNANCY. -  SURGICAL MARGIN, NEGATIVE FOR ATYPIA OR MALIGNANCY. 5. Lymph node, sentinel, biopsy, Left axillary - ONE LYMPH NODE, POSITIVE FOR METASTATIC MAMMARY CARCINOMA (1/1). Microscopic Comment 1. BREAST, INVASIVE TUMOR, WITH LYMPH NODE SAMPLING Specimen, including laterality: Right breast Procedure: Lumpectomy Grade: III of III Tubule formation: 3 Nuclear pleomorphism: 3 Mitotic: 3 Tumor size (gross measurement: 1.5 cm Margins: Invasive, distance to closest margin: 0.9 cm In-situ, distance to closest margin: 0.1 cm If margin positive, focally or broadly: N/A Lymphovascular invasion: Absent Ductal carcinoma in situ: Present Grade: III of III Extensive intraductal component: Absent Lobular neoplasia: Absent Tumor focality: Multifocal Treatment effect: None 2 of 5 FINAL for KATTI, PELLE ([id number]) Microscopic Comment(continued) If present, treatment effect in breast tissue, lymph nodes or both: N/A Extent of tumor: Skin: N/A Nipple: N/A Skeletal muscle: N/A Lymph nodes: # examined: 11 (Part 2) Lymph nodes with metastasis: 0 Breast prognostic profile: Estrogen receptor: Repeated, previous study demonstrated 0% positivity ([id number]) Progesterone receptor: Repeated, previous study demonstrated 0% positivity ([id number]) Her 2 neu: Repeated, previous study demonstrated no amplification (1.38) ([id number]) Ki-67: Not repeated, previous study demonstrated 92% proliferation rate ([id number]) Non-neoplastic breast: Previous biopsy site, fibrocystic change, sclerosing adenosis and benign calcifications in benign ducts and lobules. TNM: pT1c, pN0, pMX Comments: There are a total of four distinct areas of abnormality identified. The first 1.5 cm corresponds to the invasive ductal carcinoma. The second 0.9 cm area consists of high grade ductal carcinoma in situ with associated fibrocystic change and fibroadenomatoid nodules. In this focus, the in situ carcinoma is 1 mm from the  nearest lateral margin. The third 0.4 cm area consists of fat necrosis and fibrosis. There are no atypical or malignant findings in this area. The fourth and final 0.3 cm area demonstrate non-neoplastic findings to include fibrocystic  change. There are no atypical or malignant epithelial findings in this area either. 3. BREAST, INVASIVE TUMOR, WITH LYMPH NODE SAMPLING Specimen, including laterality: Left breast Procedure: Lumpectomy Grade: I of III Tubule formation: 3 Nuclear pleomorphism: 3 Mitotic: 1 Tumor size (gross measurement): 1.0 cm Margins: Invasive, distance to closest margin: Present at posterior In-situ, distance to closest margin: Present at posterior If margin positive, focally or broadly: Broadly (invasive) and focally (in situ) Lymphovascular invasion: Present Ductal carcinoma in situ: Present Grade: I of III Extensive intraductal component: Absent Lobular neoplasia: Absent Tumor focality: Unifocal Treatment effect: None If present, treatment effect in breast tissue, lymph nodes or both: N/A Extent of tumor: Skin: N/A Nipple: N/A Skeletal muscle: N/A Lymph nodes: # examined: 1 (Part 5) Lymph nodes with metastasis: 1 Isolated tumor cells (< 0.2 mm): 0 Micrometastasis: (> 0.2 mm and < 2.0 mm): 1 Macrometastasis: (> 2.0 mm): 0 3 of 5 FINAL for ZAKARI, COUCHMAN ([id number]) Microscopic Comment(continued) Extracapsular extension: Present Breast prognostic profile: Estrogen receptor: Not repeated, previous study demonstrated 100% positivity ([id number]). Progesterone receptor: Not repeated, previous study demonstrated 25% positivity ([id number]) Her 2 neu: Repeated, previous study demonstrated no amplification (1.42) ([id number]) Ki-67: Not repeated, previous study demonstrated 9% proliferation rate ([id number]) Non-neoplastic breast: Previous biopsy site, fibrocystic change and microcalcifications in benign ducts and lobules. TNM: pT1b, pN22mi, PMX Comments:  The additional 1.0 cm and 1.1 cm hemorrhagic areas grossly identified consist of benign breast tissue with microcalcifications and parenchymal hemorrhage. There are no atypical or malignant findings in these areas. 4. The surgical resection margin(s) of the specimen were inked and microscopically evaluated. There is no mass grossly identified. Representative sections demonstrate predominantly benign fibrovascular and adipose tissue with focal areas of fibrocytic change and sclerosing adenosis. There are no atypical or malignant epithelial or stromal findings identified. 5. There are microscopic foci of intranodal metastatic mammary tumor deposits spanning up to 1 mm. There is focal extracapsular extension identified. (CR:kh 11-25-12) [place] RUND DO Pathologist, Electronic Signature (Case signed 11/25/2012) Specimen Gross and Clinical Information   RADIOGRAPHIC STUDIES:  Chest 2 View  11/19/2012  *RADIOLOGY REPORT*  Clinical Data: Preop for breast cancer  CHEST - 2 VIEW  Comparison: None.  Findings: Cardiomediastinal silhouette is unremarkable.  No acute infiltrate or pleural effusion.  No pulmonary edema.  Bony thorax is unremarkable.  IMPRESSION: No active disease.   Original Report Authenticated By: Natasha Mead, M.D.    Nm Sentinel Node Inj-no Rpt (breast)  11/23/2012  CLINICAL DATA: Bilateral breast cancer   Sulfur colloid was injected intradermally by the nuclear medicine  technologist for breast cancer sentinel node localization.     Dg Chest Portable 1 View  11/23/2012  *RADIOLOGY REPORT*  Clinical Data: 52 year old female status post Port-A-Cath placement.  Breast cancer.  PORTABLE CHEST - 1 VIEW  Comparison: 11/19/2012.  Findings: Portable semi upright AP view 1441 hours.  Right chest subclavian approach Port-A-Cath placed.  Acute angulation of the catheter tubing at the level of the right axilla.  Tip at the level of the cavoatrial junction.  Interval postoperative changes to the right  chest wall with subcutaneous appearing drain in place.  No pneumothorax.  No pleural effusion or pulmonary edema.  Mildly increased bibasilar opacity compatible with atelectasis.  Cardiac size and mediastinal contours are within normal limits.  Visualized tracheal air column is within normal limits.  IMPRESSION: 1.  Right subclavian approach chest Port-A-Cath in place.  Tip at the level of the cavoatrial junction.  Mildly angulated  catheter tubing at the level of the axilla. 2.  Interval postoperative changes to the right chest wall. No pneumothorax. 3.  Mild dependent atelectasis.   Original Report Authenticated By: Erskine Speed, M.D.    Dg Fluoro Guide Cv Line-no Report  11/23/2012  CLINICAL DATA: bil breast cancer   FLOURO GUIDE CV LINE  Fluoroscopy was utilized by the requesting physician.  No radiographic  interpretation.     Mm Lt Plc Breast Loc Dev   1st Lesion  Inc Mammo Guide  11/23/2012  *RADIOLOGY REPORT*  Clinical Data:  Left breast cancer  NEEDLE LOCALIZATION WITH MAMMOGRAPHIC GUIDANCE AND SPECIMEN RADIOGRAPH  Comparison:  Previous exams.  Patient presents for needle localization prior to surgery.  I met with the patient and we discussed the procedure of needle localization including benefits and alternatives. We discussed the high likelihood of a successful procedure. We discussed the risks of the procedure, including infection, bleeding, tissue injury, and further surgery. Informed, written consent was given.  Using mammographic guidance, sterile technique, 2% lidocaine and a #9 modified Kopans needle, the mass and clip in the central aspect of the left breast was localized using a superior approach.  The films are marked for Dr. Derrell Lolling.  Specimen radiograph was performed at Day [facility], and confirms the mass and clip were present in the tissue sample. The clip was located at the margin of the surgical specimen.  Dr.  Derrell Lolling states she had taken more tissue at the margin to clear the border.  The  specimen is marked for pathology.  IMPRESSION: Needle localization of the left breast.  No apparent complications.   Original Report Authenticated By: Baird Lyons, M.D.    Mm Rt Plc Breast Loc Dev   1st Lesion  Inc Mammo Guide  11/23/2012  *RADIOLOGY REPORT*  Clinical Data:  Known right breast cancer  NEEDLE LOCALIZATION WITH MAMMOGRAPHIC GUIDANCE AND SPECIMEN RADIOGRAPH  Comparison:  Previous exams.  Patient presents for needle localization prior to surgery.  I met with the patient and we discussed the procedure of needle localization including benefits and alternatives. We discussed the high likelihood of a successful procedure. We discussed the risks of the procedure, including infection, bleeding, tissue injury, and further surgery. Informed, written consent was given.  Using mammographic guidance, sterile technique, 2% lidocaine and a #7 and #9 modified Kopans needles, the mass and nodularity in the upper outer quadrant of the right breast was bracketedusing a lateral approach.  The films are marked for Dr. Derrell Lolling.  Specimen radiograph was performed at Day Surgery, and confirms the mass and clip are present in the tissue sample.  The specimen is marked for pathology.  IMPRESSION: Needle localization of the right breast.  No apparent complications.   Original Report Authenticated By: Baird Lyons, M.D.     ASSESSMENT: 52 year old female with  #1 bilateral breast cancers triple negative on the right side ER positive on the left side. She is status post bilateral lumpectomies. Her chemotherapy plan is to receive Adriamycin Cytoxan given dose dense x4 cycles followed by Taxol carbo x12 cycles.  She received 4 cycles of AdriamycinTaxol Carbo was discontinued after 6 cycles due to progressive neuropathy and hand foot syndrome.  Patient was then started on Gemzar Carbo every 2 weeks Cycle 1 on 04/28/13 with day 2 neulasta.  Today is cycle 1 day 8  #2 Neuropathy--patient was on neurontin but developed swelling of  the bilateral lower extremities.  She titrated off Neurontin and takes Super B complex  daily.     PLAN:   #1 .  Patient is doing well.  Labs are stable.  I discussed her lab work with her in detail.    #2 I recommended she use saline nasal spray twice daily for the bloody nasal drainage.  She was instructed to use magic mouthwash and saline rinses after every meal and at bed time.  She will try to not glue her dentures in for a few days to allow for healing in her mouth.  Her eye exam is unremarkable.  We will eval her blurred vision next week prior to treatment as it is likely due to the steroids.   #3 She will return in one week for labs and an appointment for cycle 2 Gemzar Carbo.   All questions were answered. The patient knows to call the clinic with any problems, questions or concerns. We can certainly see the patient much sooner if necessary.  I spent 25 minutes counseling the patient face to face. The total time spent in the appointment was 30 minutes.  Cherie Ouch Lyn Hollingshead, NP Medical Oncology [facility] Phone: [phone] 05/05/2013, 11:02 AM

## 2013-05-10 ENCOUNTER — Telehealth: Payer: Self-pay | Admitting: Oncology

## 2013-05-10 NOTE — Telephone Encounter (Signed)
, °

## 2013-05-12 ENCOUNTER — Telehealth: Payer: Self-pay | Admitting: Oncology

## 2013-05-12 ENCOUNTER — Ambulatory Visit (HOSPITAL_BASED_OUTPATIENT_CLINIC_OR_DEPARTMENT_OTHER): Payer: BC Managed Care – PPO | Admitting: Oncology

## 2013-05-12 ENCOUNTER — Other Ambulatory Visit: Payer: BC Managed Care – PPO | Admitting: Lab

## 2013-05-12 ENCOUNTER — Ambulatory Visit (HOSPITAL_BASED_OUTPATIENT_CLINIC_OR_DEPARTMENT_OTHER): Payer: BC Managed Care – PPO

## 2013-05-12 ENCOUNTER — Ambulatory Visit: Payer: Self-pay | Admitting: Radiation Oncology

## 2013-05-12 ENCOUNTER — Other Ambulatory Visit (HOSPITAL_BASED_OUTPATIENT_CLINIC_OR_DEPARTMENT_OTHER): Payer: BC Managed Care – PPO | Admitting: Lab

## 2013-05-12 VITALS — BP 124/87 | HR 102 | Temp 98.4°F | Resp 20 | Ht 63.0 in | Wt 193.1 lb

## 2013-05-12 DIAGNOSIS — C50919 Malignant neoplasm of unspecified site of unspecified female breast: Secondary | ICD-10-CM

## 2013-05-12 DIAGNOSIS — C50419 Malignant neoplasm of upper-outer quadrant of unspecified female breast: Secondary | ICD-10-CM

## 2013-05-12 DIAGNOSIS — D6481 Anemia due to antineoplastic chemotherapy: Secondary | ICD-10-CM

## 2013-05-12 DIAGNOSIS — Z5111 Encounter for antineoplastic chemotherapy: Secondary | ICD-10-CM

## 2013-05-12 DIAGNOSIS — C50912 Malignant neoplasm of unspecified site of left female breast: Secondary | ICD-10-CM

## 2013-05-12 DIAGNOSIS — C50411 Malignant neoplasm of upper-outer quadrant of right female breast: Secondary | ICD-10-CM

## 2013-05-12 DIAGNOSIS — G5790 Unspecified mononeuropathy of unspecified lower limb: Secondary | ICD-10-CM

## 2013-05-12 LAB — COMPREHENSIVE METABOLIC PANEL (CC13)
ALT: 34 U/L (ref 0–55)
AST: 26 U/L (ref 5–34)
Albumin: 3.2 g/dL — ABNORMAL LOW (ref 3.5–5.0)
CO2: 24 mEq/L (ref 22–29)
Calcium: 8.8 mg/dL (ref 8.4–10.4)
Chloride: 108 mEq/L (ref 98–109)
Creatinine: 0.7 mg/dL (ref 0.6–1.1)
Potassium: 3.5 mEq/L (ref 3.5–5.1)
Total Protein: 5.9 g/dL — ABNORMAL LOW (ref 6.4–8.3)

## 2013-05-12 LAB — CBC WITH DIFFERENTIAL/PLATELET
BASO%: 0.6 % (ref 0.0–2.0)
Eosinophils Absolute: 0 10*3/uL (ref 0.0–0.5)
HCT: 32 % — ABNORMAL LOW (ref 34.8–46.6)
LYMPH%: 18.3 % (ref 14.0–49.7)
MCHC: 33.8 g/dL (ref 31.5–36.0)
MONO#: 2.1 10*3/uL — ABNORMAL HIGH (ref 0.1–0.9)
NEUT#: 5.9 10*3/uL (ref 1.5–6.5)
NEUT%: 59.5 % (ref 38.4–76.8)
Platelets: 245 10*3/uL (ref 145–400)
RBC: 3.1 10*6/uL — ABNORMAL LOW (ref 3.70–5.45)
WBC: 9.9 10*3/uL (ref 3.9–10.3)
lymph#: 1.8 10*3/uL (ref 0.9–3.3)
nRBC: 0 % (ref 0–0)

## 2013-05-12 MED ORDER — ONDANSETRON 8 MG/50ML IVPB (CHCC)
8.0000 mg | Freq: Once | INTRAVENOUS | Status: AC
Start: 2013-05-12 — End: 2013-05-12
  Administered 2013-05-12: 8 mg via INTRAVENOUS

## 2013-05-12 MED ORDER — DEXAMETHASONE SODIUM PHOSPHATE 10 MG/ML IJ SOLN
INTRAMUSCULAR | Status: AC
  Filled 2013-05-12: qty 1

## 2013-05-12 MED ORDER — HEPARIN SOD (PORK) LOCK FLUSH 100 UNIT/ML IV SOLN
500.0000 [IU] | Freq: Once | INTRAVENOUS | Status: AC | PRN
  Administered 2013-05-12: 500 [IU]
  Filled 2013-05-12: qty 5

## 2013-05-12 MED ORDER — SODIUM CHLORIDE 0.9 % IV SOLN
300.0000 mg | Freq: Once | INTRAVENOUS | Status: AC
  Administered 2013-05-12: 300 mg via INTRAVENOUS
  Filled 2013-05-12: qty 30

## 2013-05-12 MED ORDER — DEXAMETHASONE SODIUM PHOSPHATE 10 MG/ML IJ SOLN
10.0000 mg | Freq: Once | INTRAMUSCULAR | Status: AC
  Administered 2013-05-12: 10 mg via INTRAVENOUS

## 2013-05-12 MED ORDER — SODIUM CHLORIDE 0.9 % IV SOLN
Freq: Once | INTRAVENOUS | Status: AC
  Administered 2013-05-12: 11:00:00 via INTRAVENOUS

## 2013-05-12 MED ORDER — GEMCITABINE HCL CHEMO INJECTION 1 GM/26.3ML
800.0000 mg/m2 | Freq: Once | INTRAVENOUS | Status: AC
  Administered 2013-05-12: 1558 mg via INTRAVENOUS
  Filled 2013-05-12: qty 40.98

## 2013-05-12 MED ORDER — ONDANSETRON 8 MG/NS 50 ML IVPB
INTRAVENOUS | Status: AC
  Filled 2013-05-12: qty 8

## 2013-05-12 MED ORDER — SODIUM CHLORIDE 0.9 % IJ SOLN
10.0000 mL | INTRAMUSCULAR | Status: DC | PRN
  Administered 2013-05-12: 10 mL
  Filled 2013-05-12: qty 10

## 2013-05-12 NOTE — Patient Instructions (Addendum)
#  1 proceed with cycle 2 of [facility] carboplatinum today. She will return tomorrow for Neulasta injection.  #2 referral to radiation oncology at [facility]

## 2013-05-12 NOTE — Progress Notes (Signed)
OFFICE PROGRESS NOTE  CC  [name], MD [address] [place] [address], [place] [address] Dr. Claud Kelp  Dr. Antony Blackbird  DIAGNOSIS: 52 year old female with new diagnosis of bilateral breast cancers.   STAGE:  Cancer of upper-outer quadrant of female breast  Primary site: Breast (Bilateral)  Staging method: AJCC 7th Edition  Clinical: Stage IA (T1c, N0, cM0)  Summary: Stage IA (T1c, N0, cM0)   PRIOR THERAPY: #1Patient recently underwent screening mammograms and she was found to have bilateral breast abnormalities.diagnostic mammogram showed in the right breast an ill-defined 2 cm mass with a few associated well defined microcalcifications over the upper outer quadrant. Spot compression images of the left breast demonstrated an ill defined spiculated 8 mm mass centrally. Ultrasound showed irregular bordered heterogeneous hypoechoic solid mass at the 10:00 position of the right breast 6 cm from the nipple measuring 1.1 x 1.3 x 1.4 cm. Also in this location was an adjacent smaller irregular hypoechoic mass measuring 3 x 5 x 6 mm. The smaller mass was 1 cm from the large more irregular mass. Ultrasound of the axilla demonstrated single lymph node with thickened cortex. Ultrasound of the left breast demonstrated an ill-defined hypoechoic mass with distal acoustic shadowing 7 to 8:00 position 3 cm from the nipple located deep and measuring 5 x 8 x 8 mm ultrasound of the left axilla showed normal appearing lymph nodes.   #2Patient went on to have needle core biopsies performed of both of the masses. 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 The tumor was ER positive PR positive with a Ki-67 of 9% the second tumor was ER negative PR negative HER-2/neu negative with Ki-6792% and elevated.  #3 Patient had MRIs of the breasts performed. There was  noted to be 1.7 cm irregular enhancing mass located within the upper-outer quadrant of the right breast with 3 adjacent worrisome enhancing satellite nodules in aggregate the nodules and mass measures 3.8 cm. In the left breast 8 mm irregular enhancing mass located within the central left breast was noted. No evidence of axillary or internal mammary adenopathy.  #4patient is status post bilateral lumpectomies. The right breast revealed a 1.5 cm grade 3 invasive ductal carcinoma ER/PR negative HER-2/neu negative Ki-67 92%. Sentinel node was negative. Left breast showed 1.0 cm grade 3 invasive ductal carcinoma ER/PR positive HER-2/neu negative sentinel node was negative.  #5 s/p adjuvant chemotherapy consisting of Adriamycin Cytoxan every 2 weeks for a total of 4 cycles 12/30/12 - 02/17/13  She received 6 cycles of Taxol carbo from 03/10/13 - 04/14/13 and it was discontinued due to progressive neuropathy and hand foot syndrome.  She was then started on Gemzar Carbo 04/28/13 and will receive 3 cycles of this.  Patient will need adjuvant radiation therapy. Because one of the tumor is ER positive she will also receive antiestrogen therapy.  CURRENT THERAPY: Gemzar Carbo cycle 2 day 1  INTERVAL HISTORY: Amy Leonard 52 y.o. female returns for followup visit. Overall she's doing well. She seems to be tolerating the carboplatinum and gemcitabine very well. She still has weakness and fatigue she gets aches and pains. She has not had any recent hospitalizations. She is denying any fevers chills or night sweats. She still continues to have some peripheral paresthesias and in her hands and feet they are controllable she is on gabapentin. Remainder of the  10 point review of systems is negative. MEDICAL HISTORY: Past Medical History  Diagnosis Date  . Elevated WBC count     nl diff  . Chronic back pain     spinal stenosis  . Tobacco abuse   . Anxiety and depression   . HSV infection     recurrent (side and  buttox)  . Migraine   . Glaucoma     ?  . Carpal tunnel syndrome   . HLD (hyperlipidemia)   . Folliculitis 05/05/2011  . Anxiety   . Depression   . 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 GERD (gastroesophageal reflux disease)   . Hypertension   . Breast cancer   . Wears dentures     top    ALLERGIES:  is allergic to bupropion hcl; chlorhexidine gluconate; hydrocod polst-cpm polst er; lisinopril; and naproxen sodium.  MEDICATIONS:  Current Outpatient Prescriptions  Medication Sig Dispense Refill  . aspirin 81 MG chewable tablet Chew 81 mg by mouth daily.      . ATIVAN 0.5 MG tablet Take 1 tablet (0.5 mg total) by mouth every 6 (six) hours as needed (Nausea or vomiting).  45 tablet  0  . atorvastatin (LIPITOR) 10 MG tablet TAKE 1 TABLET (10 MG TOTAL) BY MOUTH DAILY.  90 tablet  0  . lidocaine-prilocaine (EMLA) cream Apply topically as needed.  30 g  8  . losartan (COZAAR) 50 MG tablet Take 1 tablet (50 mg total) by mouth daily.  90 tablet  2  . nicotine (NICODERM CQ - DOSED IN MG/24 HOURS) 21 mg/24hr patch Place 1 patch onto the skin daily.  28 patch  0  . nystatin (MYCOSTATIN) 100000 UNIT/ML suspension Take 5 mLs (500,000 Units total) by mouth 2 (two) times daily.  240 mL  3  . omeprazole (PRILOSEC) 40 MG capsule Take 1 capsule (40 mg total) by mouth 2 (two) times daily.  180 capsule  3  . tobramycin-dexamethasone (TOBRADEX) ophthalmic solution PLACE 1 DROP INTO BOTH EYES EVERY 4 (FOUR) HOURS WHILE AWAKE.  10 mL  0  . Acetaminophen (TYLENOL ARTHRITIS PAIN PO) Take 2 tablets by mouth as needed (pain).       Marland Kitchen acyclovir ointment (ZOVIRAX) 5 % Apply 1 application topically as needed (flare-up).  15 g  1  . calcium carbonate (TUMS - DOSED IN MG ELEMENTAL CALCIUM) 500 MG chewable tablet Chew by mouth as directed.        . ciprofloxacin (CIPRO) 500 MG tablet Take 1 tablet by mouth 2 (two) times daily.      . COMPRO 25 MG suppository       . dexamethasone (DECADRON) 4 MG tablet Take 1 tablet  (4 mg total) by mouth as directed. 2 tablets PO the day after chemo then 2 tablets PO BID x 2 days.  30 tablet  4  . ondansetron (ZOFRAN) 8 MG tablet       . prochlorperazine (COMPAZINE) 10 MG tablet       . simethicone (MYLICON) 80 MG chewable tablet Chew 1 tablet (80 mg total) by mouth every 8 (eight) hours as needed for flatulence.  60 tablet  3  . UNABLE TO FIND Rx: L8000-Mastectomy Bra (Quantity: 2) Dx: 174.9 Bilateral Lumpectomy  1 each  0   No current facility-administered medications for this visit.    SURGICAL HISTORY:  Past Surgical History  Procedure Laterality Date  . Btl    . Epidural steroid injection  2008  . Colonoscopy    .  Upper gi endoscopy    . Partial mastectomy with needle localization and axillary sentinel lymph node bx Bilateral 11/23/2012    Procedure: PARTIAL MASTECTOMY WITH NEEDLE LOCALIZATION AND AXILLARY SENTINEL LYMPH NODE BX;  Surgeon: Ernestene Mention, MD;  Location: [facility];  Service: General;  Laterality: Bilateral;  . Portacath placement Right 11/23/2012    Procedure: INSERTION PORT-A-CATH;  Surgeon: Ernestene Mention, MD;  Location: [facility];  Service: General;  Laterality: Right;    REVIEW OF SYSTEMS:  Pertinent items are noted in HPI.   PHYSICAL EXAMINATION: Blood pressure 124/87, pulse 102, temperature 98.4 F (36.9 C), temperature source Oral, resp. rate 20, height 5\' 3"  (1.6 m), weight 193 lb 1.6 oz (87.59 kg). Body mass index is 34.21 kg/(m^2). General: Patient is a well appearing female in no acute distress HEENT: PERRLA, no scleral injection, crusting on lashes, or periorbital noted.  sclerae anicteric no conjunctival pallor, MMM, small ulcer on right lateral upper palate/gum line Neck: supple, no palpable adenopathy Lungs: clear to auscultation bilaterally, no wheezes, rhonchi, or rales Cardiovascular: regular rhythm, S1, S2, no murmurs, rubs or gallops Abdomen: Soft, non-tender, non-distended, normoactive  bowel sounds, no HSM Extremities: warm and well perfused,no edema Skin: No rashes or lesions Neuro: Non-focal Breasts: Bilateral lumpectomy sites without nodularity, no masses or skin changes in either breast. ECOG PERFORMANCE STATUS: 0 - Asymptomatic  LABORATORY DATA: Lab Results  Component Value Date   WBC 9.9 05/12/2013   HGB 10.8* 05/12/2013   HCT 32.0* 05/12/2013   MCV 103.2* 05/12/2013   PLT 245 05/12/2013      Chemistry      Component Value Date/Time   NA 137 05/05/2013 1004   NA 135 01/08/2013 0555   K 3.6 05/05/2013 1004   K 4.0 01/08/2013 0555   CL 109* 02/17/2013 1120   CL 100 01/08/2013 0555   CO2 26 05/05/2013 1004   CO2 28 01/08/2013 0555   BUN 12.0 05/05/2013 1004   BUN 10 01/08/2013 0555   CREATININE 0.7 05/05/2013 1004   CREATININE 0.62 01/08/2013 0555      Component Value Date/Time   CALCIUM 9.0 05/05/2013 1004   CALCIUM 9.1 01/08/2013 0555   ALKPHOS 74 05/05/2013 1004   ALKPHOS 67 01/05/2013 2245   AST 14 05/05/2013 1004   AST 10 01/05/2013 2245   ALT 40 05/05/2013 1004   ALT 15 01/05/2013 2245   BILITOT 0.39 05/05/2013 1004   BILITOT 0.5 01/05/2013 2245    ADDITIONAL INFORMATION: 1. CHROMOGENIC IN-SITU HYBRIDIZATION Results: HER-2/NEU BY CISH - NO AMPLIFICATION OF HER-2 DETECTED. RESULT RATIO OF HER2: CEP 17 SIGNALS 1.00 AVERAGE HER2 COPY NUMBER PER CELL 1.55 REFERENCE RANGE NEGATIVE HER2/Chr17 Ratio <2.0 and Average HER2 copy number <4.0 EQUIVOCAL HER2/Chr17 Ratio <2.0 and Average HER2 copy number 4.0 and <6.0 POSITIVE HER2/Chr17 Ratio >=2.0 and/or Average HER2 copy number >=6.0 Pecola Leisure MD Pathologist, Electronic Signature ( Signed 12/01/2012) 3. CHROMOGENIC IN-SITU HYBRIDIZATION Results: HER-2/NEU BY CISH - NO AMPLIFICATION OF HER-2 DETECTED. RESULT RATIO OF HER2: CEP 17 SIGNALS 1.22 AVERAGE HER2 COPY NUMBER PER CELL 1.40 REFERENCE RANGE NEGATIVE HER2/Chr17 Ratio <2.0 and Average HER2 copy number <4.0 EQUIVOCAL HER2/Chr17 Ratio <2.0 and Average HER2  copy number 4.0 and <6.0 POSITIVE HER2/Chr17 Ratio >=2.0 and/or Average HER2 copy number >=6.0 1 of 5 FINAL for YASHVI, JASINSKI ([id number]) ADDITIONAL INFORMATION:(continued) Pecola Leisure MD Pathologist, Electronic Signature ( Signed 12/01/2012) FINAL DIAGNOSIS Diagnosis 1. Breast, lumpectomy, Right - INVASIVE DUCTAL CARCINOMA, GRADE  III, SEE COMMENT. - INVASIVE CARCINOMA IS 0.9 CM FROM NEAREST MARGIN (INFERIOR). - NO LYMPHOVASCULAR INVASION IDENTIFIED. - HIGH GRADE DUCTAL CARCINOMA IN SITU - IN SITU CARCINOMA IS 1 MM FROM THE NEAREST LATERAL MARGIN - SEE TUMOR SYNOPTIC TEMPLATE BELOW. 2. Lymph nodes, regional resection, Right axilla - ELEVEN LYMPH NODES, NEGATIVE FOR TUMOR (0/11). 3. Breast, lumpectomy, Left - INVASIVE DUCTAL CARCINOMA, GRADE I, SEE COMMENT. - LYMPHOVASCULAR INVASION IDENTIFIED - INVASIVE CARCINOMA IS BROADLY PRESENT AT POSTERIOR MARGIN - DUCTAL CARCINOMA IN SITU, GRADE I. - IN SITU CARCINOMA IS FOCALLY PRESENT AT POSTERIOR MARGIN - SEE TUMOR SYNOPTIC TEMPLATE BELOW. 4. Breast, excision, Left - BENIGN BREAST TISSUE, SEE COMMENT. - NEGATIVE FOR ATYPIA OR MALIGNANCY. - SURGICAL MARGIN, NEGATIVE FOR ATYPIA OR MALIGNANCY. 5. Lymph node, sentinel, biopsy, Left axillary - ONE LYMPH NODE, POSITIVE FOR METASTATIC MAMMARY CARCINOMA (1/1). Microscopic Comment 1. BREAST, INVASIVE TUMOR, WITH LYMPH NODE SAMPLING Specimen, including laterality: Right breast Procedure: Lumpectomy Grade: III of III Tubule formation: 3 Nuclear pleomorphism: 3 Mitotic: 3 Tumor size (gross measurement: 1.5 cm Margins: Invasive, distance to closest margin: 0.9 cm In-situ, distance to closest margin: 0.1 cm If margin positive, focally or broadly: N/A Lymphovascular invasion: Absent Ductal carcinoma in situ: Present Grade: III of III Extensive intraductal component: Absent Lobular neoplasia: Absent Tumor focality: Multifocal Treatment effect: None 2 of 5 FINAL for KYNDELL, ZEISER ([id number]) Microscopic Comment(continued) If present, treatment effect in breast tissue, lymph nodes or both: N/A Extent of tumor: Skin: N/A Nipple: N/A Skeletal muscle: N/A Lymph nodes: # examined: 11 (Part 2) Lymph nodes with metastasis: 0 Breast prognostic profile: Estrogen receptor: Repeated, previous study demonstrated 0% positivity ([id number]) Progesterone receptor: Repeated, previous study demonstrated 0% positivity ([id number]) Her 2 neu: Repeated, previous study demonstrated no amplification (1.38) ([id number]) Ki-67: Not repeated, previous study demonstrated 92% proliferation rate ([id number]) Non-neoplastic breast: Previous biopsy site, fibrocystic change, sclerosing adenosis and benign calcifications in benign ducts and lobules. TNM: pT1c, pN0, pMX Comments: There are a total of four distinct areas of abnormality identified. The first 1.5 cm corresponds to the invasive ductal carcinoma. The second 0.9 cm area consists of high grade ductal carcinoma in situ with associated fibrocystic change and fibroadenomatoid nodules. In this focus, the in situ carcinoma is 1 mm from the nearest lateral margin. The third 0.4 cm area consists of fat necrosis and fibrosis. There are no atypical or malignant findings in this area. The fourth and final 0.3 cm area demonstrate non-neoplastic findings to include fibrocystic change. There are no atypical or malignant epithelial findings in this area either. 3. BREAST, INVASIVE TUMOR, WITH LYMPH NODE SAMPLING Specimen, including laterality: Left breast Procedure: Lumpectomy Grade: I of III Tubule formation: 3 Nuclear pleomorphism: 3 Mitotic: 1 Tumor size (gross measurement): 1.0 cm Margins: Invasive, distance to closest margin: Present at posterior In-situ, distance to closest margin: Present at posterior If margin positive, focally or broadly: Broadly (invasive) and focally (in situ) Lymphovascular invasion: Present Ductal carcinoma  in situ: Present Grade: I of III Extensive intraductal component: Absent Lobular neoplasia: Absent Tumor focality: Unifocal Treatment effect: None If present, treatment effect in breast tissue, lymph nodes or both: N/A Extent of tumor: Skin: N/A Nipple: N/A Skeletal muscle: N/A Lymph nodes: # examined: 1 (Part 5) Lymph nodes with metastasis: 1 Isolated tumor cells (< 0.2 mm): 0 Micrometastasis: (> 0.2 mm and < 2.0 mm): 1 Macrometastasis: (> 2.0 mm): 0 3 of 5 FINAL for CRYSTALLYNN, NOORANI ([id number]) Microscopic Comment(continued) Extracapsular extension: Present Breast  prognostic profile: Estrogen receptor: Not repeated, previous study demonstrated 100% positivity ([id number]). Progesterone receptor: Not repeated, previous study demonstrated 25% positivity ([id number]) Her 2 neu: Repeated, previous study demonstrated no amplification (1.42) ([id number]) Ki-67: Not repeated, previous study demonstrated 9% proliferation rate ([id number]) Non-neoplastic breast: Previous biopsy site, fibrocystic change and microcalcifications in benign ducts and lobules. TNM: pT1b, pN37mi, PMX Comments: The additional 1.0 cm and 1.1 cm hemorrhagic areas grossly identified consist of benign breast tissue with microcalcifications and parenchymal hemorrhage. There are no atypical or malignant findings in these areas. 4. The surgical resection margin(s) of the specimen were inked and microscopically evaluated. There is no mass grossly identified. Representative sections demonstrate predominantly benign fibrovascular and adipose tissue with focal areas of fibrocytic change and sclerosing adenosis. There are no atypical or malignant epithelial or stromal findings identified. 5. There are microscopic foci of intranodal metastatic mammary tumor deposits spanning up to 1 mm. There is focal extracapsular extension identified. (CR:kh 11-25-12) [place] RUND DO Pathologist, Electronic Signature (Case signed  11/25/2012) Specimen Gross and Clinical Information   RADIOGRAPHIC STUDIES:  Chest 2 View  11/19/2012  *RADIOLOGY REPORT*  Clinical Data: Preop for breast cancer  CHEST - 2 VIEW  Comparison: None.  Findings: Cardiomediastinal silhouette is unremarkable.  No acute infiltrate or pleural effusion.  No pulmonary edema.  Bony thorax is unremarkable.  IMPRESSION: No active disease.   Original Report Authenticated By: Natasha Mead, M.D.    Nm Sentinel Node Inj-no Rpt (breast)  11/23/2012  CLINICAL DATA: Bilateral breast cancer   Sulfur colloid was injected intradermally by the nuclear medicine  technologist for breast cancer sentinel node localization.     Dg Chest Portable 1 View  11/23/2012  *RADIOLOGY REPORT*  Clinical Data: 52 year old female status post Port-A-Cath placement.  Breast cancer.  PORTABLE CHEST - 1 VIEW  Comparison: 11/19/2012.  Findings: Portable semi upright AP view 1441 hours.  Right chest subclavian approach Port-A-Cath placed.  Acute angulation of the catheter tubing at the level of the right axilla.  Tip at the level of the cavoatrial junction.  Interval postoperative changes to the right chest wall with subcutaneous appearing drain in place.  No pneumothorax.  No pleural effusion or pulmonary edema.  Mildly increased bibasilar opacity compatible with atelectasis.  Cardiac size and mediastinal contours are within normal limits.  Visualized tracheal air column is within normal limits.  IMPRESSION: 1.  Right subclavian approach chest Port-A-Cath in place.  Tip at the level of the cavoatrial junction.  Mildly angulated catheter tubing at the level of the axilla. 2.  Interval postoperative changes to the right chest wall. No pneumothorax. 3.  Mild dependent atelectasis.   Original Report Authenticated By: Erskine Speed, M.D.    Dg Fluoro Guide Cv Line-no Report  11/23/2012  CLINICAL DATA: bil breast cancer   FLOURO GUIDE CV LINE  Fluoroscopy was utilized by the requesting physician.  No  radiographic  interpretation.     Mm Lt Plc Breast Loc Dev   1st Lesion  Inc Mammo Guide  11/23/2012  *RADIOLOGY REPORT*  Clinical Data:  Left breast cancer  NEEDLE LOCALIZATION WITH MAMMOGRAPHIC GUIDANCE AND SPECIMEN RADIOGRAPH  Comparison:  Previous exams.  Patient presents for needle localization prior to surgery.  I met with the patient and we discussed the procedure of needle localization including benefits and alternatives. We discussed the high likelihood of a successful procedure. We discussed the risks of the procedure, including infection, bleeding, tissue injury, and further surgery. Informed, written consent was  given.  Using mammographic guidance, sterile technique, 2% lidocaine and a #9 modified Kopans needle, the mass and clip in the central aspect of the left breast was localized using a superior approach.  The films are marked for Dr. Derrell Lolling.  Specimen radiograph was performed at [facility], and confirms the mass and clip were present in the tissue sample. The clip was located at the margin of the surgical specimen.  Dr.  Derrell Lolling states she had taken more tissue at the margin to clear the border.  The specimen is marked for pathology.  IMPRESSION: Needle localization of the left breast.  No apparent complications.   Original Report Authenticated By: Baird Lyons, M.D.    Mm Rt Plc Breast Loc Dev   1st Lesion  Inc Mammo Guide  11/23/2012  *RADIOLOGY REPORT*  Clinical Data:  Known right breast cancer  NEEDLE LOCALIZATION WITH MAMMOGRAPHIC GUIDANCE AND SPECIMEN RADIOGRAPH  Comparison:  Previous exams.  Patient presents for needle localization prior to surgery.  I met with the patient and we discussed the procedure of needle localization including benefits and alternatives. We discussed the high likelihood of a successful procedure. We discussed the risks of the procedure, including infection, bleeding, tissue injury, and further surgery. Informed, written consent was given.  Using mammographic  guidance, sterile technique, 2% lidocaine and a #7 and #9 modified Kopans needles, the mass and nodularity in the upper outer quadrant of the right breast was bracketedusing a lateral approach.  The films are marked for Dr. Derrell Lolling.  Specimen radiograph was performed at Day Surgery, and confirms the mass and clip are present in the tissue sample.  The specimen is marked for pathology.  IMPRESSION: Needle localization of the right breast.  No apparent complications.   Original Report Authenticated By: Baird Lyons, M.D.     ASSESSMENT: 52 year old female with  #1 bilateral breast cancers triple negative on the right side ER positive on the left side. She is status post bilateral lumpectomies.she is s/p is  Adriamycin Cytoxan given dose dense x4 cycles followed by 6 cycles ofTaxol carbo.  Taxol/ Carbo was discontinued after 6 cycles due to progressive neuropathy and hand foot syndrome.  Patient was then started on Gemzar Carbo every 2 weeks Cycle 1 on 04/28/13 with day 2 neulasta.    #2 Neuropathy--patient was on neurontin but developed swelling of the bilateral lower extremities.  Patient will continue gabapentin and super B complex   #3 anemia due to chemotherapy we will continue to monitor her   PLAN:   #1 patient will proceed with cycle #2 of gemcitabine/carboplatinum.  #2 she will return in one week's time for followup and interim lab All questions were answered. The patient knows to call the clinic with any problems, questions or concerns. We can certainly see the patient much sooner if necessary.  I spent 25 minutes counseling the patient face to face. The total time spent in the appointment was 30 minutes.      Drue Second, MD Medical/Oncology [facility] [phone] (beeper) [phone] (Office)

## 2013-05-12 NOTE — Patient Instructions (Addendum)
[facility] Discharge Instructions for Patients Receiving Chemotherapy  Today you received the following chemotherapy agents Gemzar and Carboplatin  To help prevent nausea and vomiting after your treatment, we encourage you to take your nausea medication as prescribed   If you develop nausea and vomiting that is not controlled by your nausea medication, call the clinic.   BELOW ARE SYMPTOMS THAT SHOULD BE REPORTED IMMEDIATELY:  *FEVER GREATER THAN 100.5 F  *CHILLS WITH OR WITHOUT FEVER  NAUSEA AND VOMITING THAT IS NOT CONTROLLED WITH YOUR NAUSEA MEDICATION  *UNUSUAL SHORTNESS OF BREATH  *UNUSUAL BRUISING OR BLEEDING  TENDERNESS IN MOUTH AND THROAT WITH OR WITHOUT PRESENCE OF ULCERS  *URINARY PROBLEMS  *BOWEL PROBLEMS  UNUSUAL RASH Items with * indicate a potential emergency and should be followed up as soon as possible.  Feel free to call the clinic you have any questions or concerns. The clinic phone number is [phone].    

## 2013-05-13 ENCOUNTER — Ambulatory Visit: Payer: BC Managed Care – PPO

## 2013-05-13 ENCOUNTER — Ambulatory Visit (HOSPITAL_BASED_OUTPATIENT_CLINIC_OR_DEPARTMENT_OTHER): Payer: BC Managed Care – PPO

## 2013-05-13 VITALS — BP 123/75 | HR 112 | Temp 98.4°F

## 2013-05-13 DIAGNOSIS — C50919 Malignant neoplasm of unspecified site of unspecified female breast: Secondary | ICD-10-CM

## 2013-05-13 DIAGNOSIS — Z5189 Encounter for other specified aftercare: Secondary | ICD-10-CM

## 2013-05-13 MED ORDER — PEGFILGRASTIM INJECTION 6 MG/0.6ML
6.0000 mg | Freq: Once | SUBCUTANEOUS | Status: AC
  Administered 2013-05-13: 6 mg via SUBCUTANEOUS
  Filled 2013-05-13: qty 0.6

## 2013-05-13 NOTE — Patient Instructions (Addendum)

## 2013-05-19 ENCOUNTER — Encounter: Payer: Self-pay | Admitting: Adult Health

## 2013-05-19 ENCOUNTER — Ambulatory Visit (HOSPITAL_BASED_OUTPATIENT_CLINIC_OR_DEPARTMENT_OTHER): Payer: BC Managed Care – PPO | Admitting: Adult Health

## 2013-05-19 ENCOUNTER — Other Ambulatory Visit (HOSPITAL_BASED_OUTPATIENT_CLINIC_OR_DEPARTMENT_OTHER): Payer: BC Managed Care – PPO | Admitting: Lab

## 2013-05-19 ENCOUNTER — Ambulatory Visit: Payer: Self-pay | Admitting: Radiation Oncology

## 2013-05-19 ENCOUNTER — Encounter: Payer: Self-pay | Admitting: Oncology

## 2013-05-19 VITALS — BP 138/85 | HR 101 | Temp 98.0°F | Resp 20 | Ht 63.0 in | Wt 192.1 lb

## 2013-05-19 DIAGNOSIS — D6481 Anemia due to antineoplastic chemotherapy: Secondary | ICD-10-CM

## 2013-05-19 DIAGNOSIS — G5790 Unspecified mononeuropathy of unspecified lower limb: Secondary | ICD-10-CM

## 2013-05-19 DIAGNOSIS — C50419 Malignant neoplasm of upper-outer quadrant of unspecified female breast: Secondary | ICD-10-CM

## 2013-05-19 DIAGNOSIS — C50411 Malignant neoplasm of upper-outer quadrant of right female breast: Secondary | ICD-10-CM

## 2013-05-19 DIAGNOSIS — C50919 Malignant neoplasm of unspecified site of unspecified female breast: Secondary | ICD-10-CM

## 2013-05-19 LAB — CBC WITH DIFFERENTIAL/PLATELET
Basophils Absolute: 0.1 10*3/uL (ref 0.0–0.1)
Eosinophils Absolute: 0 10*3/uL (ref 0.0–0.5)
HCT: 29.5 % — ABNORMAL LOW (ref 34.8–46.6)
HGB: 10.2 g/dL — ABNORMAL LOW (ref 11.6–15.9)
MCV: 102.8 fL — ABNORMAL HIGH (ref 79.5–101.0)
MONO%: 3.6 % (ref 0.0–14.0)
NEUT#: 10.3 10*3/uL — ABNORMAL HIGH (ref 1.5–6.5)
RDW: 16.6 % — ABNORMAL HIGH (ref 11.2–14.5)
lymph#: 1.5 10*3/uL (ref 0.9–3.3)

## 2013-05-19 LAB — COMPREHENSIVE METABOLIC PANEL (CC13)
Albumin: 3.5 g/dL (ref 3.5–5.0)
BUN: 11 mg/dL (ref 7.0–26.0)
Calcium: 9.1 mg/dL (ref 8.4–10.4)
Chloride: 105 mEq/L (ref 98–109)
Glucose: 121 mg/dl (ref 70–140)
Potassium: 3.5 mEq/L (ref 3.5–5.1)

## 2013-05-19 NOTE — Progress Notes (Addendum)
OFFICE PROGRESS NOTE  CC  [name], MD [address] [place] [address] [place] [address] Dr. Claud Kelp  Dr. Antony Blackbird  DIAGNOSIS: 52 year old female with new diagnosis of bilateral breast cancers.   STAGE:  Cancer of upper-outer quadrant of female breast  Primary site: Breast (Bilateral)  Staging method: AJCC 7th Edition  Clinical: Stage IA (T1c, N0, cM0)  Summary: Stage IA (T1c, N0, cM0)   PRIOR THERAPY: #1Patient recently underwent screening mammograms and she was found to have bilateral breast abnormalities.diagnostic mammogram showed in the right breast an ill-defined 2 cm mass with a few associated well defined microcalcifications over the upper outer quadrant. Spot compression images of the left breast demonstrated an ill defined spiculated 8 mm mass centrally. Ultrasound showed irregular bordered heterogeneous hypoechoic solid mass at the 10:00 position of the right breast 6 cm from the nipple measuring 1.1 x 1.3 x 1.4 cm. Also in this location was an adjacent smaller irregular hypoechoic mass measuring 3 x 5 x 6 mm. The smaller mass was 1 cm from the large more irregular mass. Ultrasound of the axilla demonstrated single lymph node with thickened cortex. Ultrasound of the left breast demonstrated an ill-defined hypoechoic mass with distal acoustic shadowing 7 to 8:00 position 3 cm from the nipple located deep and measuring 5 x 8 x 8 mm ultrasound of the left axilla showed normal appearing lymph nodes.   #2Patient went on to have needle core biopsies performed of both of the masses. 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 The tumor was ER positive PR positive with a Ki-67 of 9% the second tumor was ER negative PR negative HER-2/neu negative with Ki-6792% and elevated.  #3 Patient had MRIs of the breasts performed. There was  noted to be 1.7 cm irregular enhancing mass located within the upper-outer quadrant of the right breast with 3 adjacent worrisome enhancing satellite nodules in aggregate the nodules and mass measures 3.8 cm. In the left breast 8 mm irregular enhancing mass located within the central left breast was noted. No evidence of axillary or internal mammary adenopathy.  #4patient is status post bilateral lumpectomies. The right breast revealed a 1.5 cm grade 3 invasive ductal carcinoma ER/PR negative HER-2/neu negative Ki-67 92%. Sentinel node was negative. Left breast showed 1.0 cm grade 3 invasive ductal carcinoma ER/PR positive HER-2/neu negative sentinel node was negative.  #5 s/p adjuvant chemotherapy consisting of Adriamycin Cytoxan every 2 weeks for a total of 4 cycles 12/30/12 - 02/17/13  She received 6 cycles of Taxol carbo from 03/10/13 - 04/14/13 and it was discontinued due to progressive neuropathy and hand foot syndrome.  She was then started on Gemzar Carbo 04/28/13 and will receive 3 cycles of this.  Patient will need adjuvant radiation therapy. Because one of the tumor is ER positive she will also receive antiestrogen therapy.  CURRENT THERAPY: Gemzar Carbo cycle 2 day 8  INTERVAL HISTORY: Amy Leonard 52 y.o. female returns for followup visit. Overall she's doing well. She seems to be tolerating the carboplatinum and gemcitabine very well. She continues to have numbness that is stable and has not caused any motor changes.  She has stopped Neurontin due to swelling of the lower extremities.  She continues to take super b complex daily.  Otherwise, she is doing well and w/o questions/concerns.  She denies fevers, chills, nausea,  vomiting, constipation, diarrhea or further concerns.   MEDICAL HISTORY: Past Medical History  Diagnosis Date  . Elevated WBC count     nl diff  . Chronic back pain     spinal stenosis  . Tobacco abuse   . Anxiety and depression   . HSV infection     recurrent (side  and buttox)  . Migraine   . Glaucoma     ?  . Carpal tunnel syndrome   . HLD (hyperlipidemia)   . Folliculitis 05/05/2011  . Anxiety   . Depression   . 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 GERD (gastroesophageal reflux disease)   . Hypertension   . Breast cancer   . Wears dentures     top    ALLERGIES:  is allergic to bupropion hcl; chlorhexidine gluconate; hydrocod polst-cpm polst er; lisinopril; and naproxen sodium.  MEDICATIONS:  Current Outpatient Prescriptions  Medication Sig Dispense Refill  . Acetaminophen (TYLENOL ARTHRITIS PAIN PO) Take 2 tablets by mouth as needed (pain).       Marland Kitchen aspirin 81 MG chewable tablet Chew 81 mg by mouth daily.      . ATIVAN 0.5 MG tablet Take 1 tablet (0.5 mg total) by mouth every 6 (six) hours as needed (Nausea or vomiting).  45 tablet  0  . atorvastatin (LIPITOR) 10 MG tablet TAKE 1 TABLET (10 MG TOTAL) BY MOUTH DAILY.  90 tablet  0  . calcium carbonate (TUMS - DOSED IN MG ELEMENTAL CALCIUM) 500 MG chewable tablet Chew by mouth as directed.        . ciprofloxacin (CIPRO) 500 MG tablet Take 1 tablet by mouth 2 (two) times daily.      . COMPRO 25 MG suppository       . dexamethasone (DECADRON) 4 MG tablet Take 1 tablet (4 mg total) by mouth as directed. 2 tablets PO the day after chemo then 2 tablets PO BID x 2 days.  30 tablet  4  . lidocaine-prilocaine (EMLA) cream Apply topically as needed.  30 g  8  . losartan (COZAAR) 50 MG tablet Take 1 tablet (50 mg total) by mouth daily.  90 tablet  2  . nicotine (NICODERM CQ - DOSED IN MG/24 HOURS) 21 mg/24hr patch Place 1 patch onto the skin daily.  28 patch  0  . nystatin (MYCOSTATIN) 100000 UNIT/ML suspension Take 5 mLs (500,000 Units total) by mouth 2 (two) times daily.  240 mL  3  . omeprazole (PRILOSEC) 40 MG capsule Take 1 capsule (40 mg total) by mouth 2 (two) times daily.  180 capsule  3  . ondansetron (ZOFRAN) 8 MG tablet       . prochlorperazine (COMPAZINE) 10 MG tablet       . simethicone  (MYLICON) 80 MG chewable tablet Chew 1 tablet (80 mg total) by mouth every 8 (eight) hours as needed for flatulence.  60 tablet  3  . tobramycin-dexamethasone (TOBRADEX) ophthalmic solution PLACE 1 DROP INTO BOTH EYES EVERY 4 (FOUR) HOURS WHILE AWAKE.  10 mL  0  . UNABLE TO FIND Rx: L8000-Mastectomy Bra (Quantity: 2) Dx: 174.9 Bilateral Lumpectomy  1 each  0  . acyclovir ointment (ZOVIRAX) 5 % Apply 1 application topically as needed (flare-up).  15 g  1   No current facility-administered medications for this visit.    SURGICAL HISTORY:  Past Surgical History  Procedure Laterality Date  . Btl    . Epidural steroid injection  2008  . Colonoscopy    .  Upper gi endoscopy    . Partial mastectomy with needle localization and axillary sentinel lymph node bx Bilateral 11/23/2012    Procedure: PARTIAL MASTECTOMY WITH NEEDLE LOCALIZATION AND AXILLARY SENTINEL LYMPH NODE BX;  Surgeon: Ernestene Mention, MD;  Location: [facility];  Service: General;  Laterality: Bilateral;  . Portacath placement Right 11/23/2012    Procedure: INSERTION PORT-A-CATH;  Surgeon: Ernestene Mention, MD;  Location: [facility];  Service: General;  Laterality: Right;    REVIEW OF SYSTEMS:  A 10 point review of systems was conducted and is otherwise negative except for what is noted above.     PHYSICAL EXAMINATION: Blood pressure 138/85, pulse 101, temperature 98 F (36.7 C), temperature source Oral, resp. rate 20, height 5\' 3"  (1.6 m), weight 192 lb 1.6 oz (87.136 kg). Body mass index is 34.04 kg/(m^2). General: Patient is a well appearing female in no acute distress HEENT: PERRLA, no scleral injection, crusting on lashes, or periorbital noted.  sclerae anicteric no conjunctival pallor, MMM, small ulcer on right lateral upper palate/gum line Neck: supple, no palpable adenopathy Lungs: clear to auscultation bilaterally, no wheezes, rhonchi, or rales Cardiovascular: regular rhythm, S1, S2, no  murmurs, rubs or gallops Abdomen: Soft, non-tender, non-distended, normoactive bowel sounds, no HSM Extremities: warm and well perfused,no edema Skin: No rashes or lesions Neuro: Non-focal Breasts: Bilateral lumpectomy sites without nodularity, no masses or skin changes in either breast. ECOG PERFORMANCE STATUS: 0 - Asymptomatic  LABORATORY DATA: Lab Results  Component Value Date   WBC 12.4* 05/19/2013   HGB 10.2* 05/19/2013   HCT 29.5* 05/19/2013   MCV 102.8* 05/19/2013   PLT 104* 05/19/2013      Chemistry      Component Value Date/Time   NA 140 05/19/2013 1054   NA 135 01/08/2013 0555   K 3.5 05/19/2013 1054   K 4.0 01/08/2013 0555   CL 109* 02/17/2013 1120   CL 100 01/08/2013 0555   CO2 25 05/19/2013 1054   CO2 28 01/08/2013 0555   BUN 11.0 05/19/2013 1054   BUN 10 01/08/2013 0555   CREATININE 0.6 05/19/2013 1054   CREATININE 0.62 01/08/2013 0555      Component Value Date/Time   CALCIUM 9.1 05/19/2013 1054   CALCIUM 9.1 01/08/2013 0555   ALKPHOS 125 05/19/2013 1054   ALKPHOS 67 01/05/2013 2245   AST 26 05/19/2013 1054   AST 10 01/05/2013 2245   ALT 52 05/19/2013 1054   ALT 15 01/05/2013 2245   BILITOT 0.37 05/19/2013 1054   BILITOT 0.5 01/05/2013 2245    ADDITIONAL INFORMATION: 1. CHROMOGENIC IN-SITU HYBRIDIZATION Results: HER-2/NEU BY CISH - NO AMPLIFICATION OF HER-2 DETECTED. RESULT RATIO OF HER2: CEP 17 SIGNALS 1.00 AVERAGE HER2 COPY NUMBER PER CELL 1.55 REFERENCE RANGE NEGATIVE HER2/Chr17 Ratio <2.0 and Average HER2 copy number <4.0 EQUIVOCAL HER2/Chr17 Ratio <2.0 and Average HER2 copy number 4.0 and <6.0 POSITIVE HER2/Chr17 Ratio >=2.0 and/or Average HER2 copy number >=6.0 Pecola Leisure MD Pathologist, Electronic Signature ( Signed 12/01/2012) 3. CHROMOGENIC IN-SITU HYBRIDIZATION Results: HER-2/NEU BY CISH - NO AMPLIFICATION OF HER-2 DETECTED. RESULT RATIO OF HER2: CEP 17 SIGNALS 1.22 AVERAGE HER2 COPY NUMBER PER CELL 1.40 REFERENCE RANGE NEGATIVE HER2/Chr17 Ratio <2.0  and Average HER2 copy number <4.0 EQUIVOCAL HER2/Chr17 Ratio <2.0 and Average HER2 copy number 4.0 and <6.0 POSITIVE HER2/Chr17 Ratio >=2.0 and/or Average HER2 copy number >=6.0 1 of 5 FINAL for Amy Leonard, Amy Leonard ([id number]) ADDITIONAL INFORMATION:(continued) Pecola Leisure MD Pathologist, Electronic Signature (  Signed 12/01/2012) FINAL DIAGNOSIS Diagnosis 1. Breast, lumpectomy, Right - INVASIVE DUCTAL CARCINOMA, GRADE III, SEE COMMENT. - INVASIVE CARCINOMA IS 0.9 CM FROM NEAREST MARGIN (INFERIOR). - NO LYMPHOVASCULAR INVASION IDENTIFIED. - HIGH GRADE DUCTAL CARCINOMA IN SITU - IN SITU CARCINOMA IS 1 MM FROM THE NEAREST LATERAL MARGIN - SEE TUMOR SYNOPTIC TEMPLATE BELOW. 2. Lymph nodes, regional resection, Right axilla - ELEVEN LYMPH NODES, NEGATIVE FOR TUMOR (0/11). 3. Breast, lumpectomy, Left - INVASIVE DUCTAL CARCINOMA, GRADE I, SEE COMMENT. - LYMPHOVASCULAR INVASION IDENTIFIED - INVASIVE CARCINOMA IS BROADLY PRESENT AT POSTERIOR MARGIN - DUCTAL CARCINOMA IN SITU, GRADE I. - IN SITU CARCINOMA IS FOCALLY PRESENT AT POSTERIOR MARGIN - SEE TUMOR SYNOPTIC TEMPLATE BELOW. 4. Breast, excision, Left - BENIGN BREAST TISSUE, SEE COMMENT. - NEGATIVE FOR ATYPIA OR MALIGNANCY. - SURGICAL MARGIN, NEGATIVE FOR ATYPIA OR MALIGNANCY. 5. Lymph node, sentinel, biopsy, Left axillary - ONE LYMPH NODE, POSITIVE FOR METASTATIC MAMMARY CARCINOMA (1/1). Microscopic Comment 1. BREAST, INVASIVE TUMOR, WITH LYMPH NODE SAMPLING Specimen, including laterality: Right breast Procedure: Lumpectomy Grade: III of III Tubule formation: 3 Nuclear pleomorphism: 3 Mitotic: 3 Tumor size (gross measurement: 1.5 cm Margins: Invasive, distance to closest margin: 0.9 cm In-situ, distance to closest margin: 0.1 cm If margin positive, focally or broadly: N/A Lymphovascular invasion: Absent Ductal carcinoma in situ: Present Grade: III of III Extensive intraductal component: Absent Lobular neoplasia:  Absent Tumor focality: Multifocal Treatment effect: None 2 of 5 FINAL for Amy Leonard, Amy Leonard ([id number]) Microscopic Comment(continued) If present, treatment effect in breast tissue, lymph nodes or both: N/A Extent of tumor: Skin: N/A Nipple: N/A Skeletal muscle: N/A Lymph nodes: # examined: 11 (Part 2) Lymph nodes with metastasis: 0 Breast prognostic profile: Estrogen receptor: Repeated, previous study demonstrated 0% positivity ([id number]) Progesterone receptor: Repeated, previous study demonstrated 0% positivity ([id number]) Her 2 neu: Repeated, previous study demonstrated no amplification (1.38) ([id number]) Ki-67: Not repeated, previous study demonstrated 92% proliferation rate ([id number]) Non-neoplastic breast: Previous biopsy site, fibrocystic change, sclerosing adenosis and benign calcifications in benign ducts and lobules. TNM: pT1c, pN0, pMX Comments: There are a total of four distinct areas of abnormality identified. The first 1.5 cm corresponds to the invasive ductal carcinoma. The second 0.9 cm area consists of high grade ductal carcinoma in situ with associated fibrocystic change and fibroadenomatoid nodules. In this focus, the in situ carcinoma is 1 mm from the nearest lateral margin. The third 0.4 cm area consists of fat necrosis and fibrosis. There are no atypical or malignant findings in this area. The fourth and final 0.3 cm area demonstrate non-neoplastic findings to include fibrocystic change. There are no atypical or malignant epithelial findings in this area either. 3. BREAST, INVASIVE TUMOR, WITH LYMPH NODE SAMPLING Specimen, including laterality: Left breast Procedure: Lumpectomy Grade: I of III Tubule formation: 3 Nuclear pleomorphism: 3 Mitotic: 1 Tumor size (gross measurement): 1.0 cm Margins: Invasive, distance to closest margin: Present at posterior In-situ, distance to closest margin: Present at posterior If margin positive, focally or  broadly: Broadly (invasive) and focally (in situ) Lymphovascular invasion: Present Ductal carcinoma in situ: Present Grade: I of III Extensive intraductal component: Absent Lobular neoplasia: Absent Tumor focality: Unifocal Treatment effect: None If present, treatment effect in breast tissue, lymph nodes or both: N/A Extent of tumor: Skin: N/A Nipple: N/A Skeletal muscle: N/A Lymph nodes: # examined: 1 (Part 5) Lymph nodes with metastasis: 1 Isolated tumor cells (< 0.2 mm): 0 Micrometastasis: (> 0.2 mm and < 2.0 mm): 1 Macrometastasis: (> 2.0 mm): 0 3  of 5 FINAL for Amy Leonard, Amy Leonard ([id number]) Microscopic Comment(continued) Extracapsular extension: Present Breast prognostic profile: Estrogen receptor: Not repeated, previous study demonstrated 100% positivity ([id number]). Progesterone receptor: Not repeated, previous study demonstrated 25% positivity ([id number]) Her 2 neu: Repeated, previous study demonstrated no amplification (1.42) ([id number]) Ki-67: Not repeated, previous study demonstrated 9% proliferation rate ([id number]) Non-neoplastic breast: Previous biopsy site, fibrocystic change and microcalcifications in benign ducts and lobules. TNM: pT1b, pN55mi, PMX Comments: The additional 1.0 cm and 1.1 cm hemorrhagic areas grossly identified consist of benign breast tissue with microcalcifications and parenchymal hemorrhage. There are no atypical or malignant findings in these areas. 4. The surgical resection margin(s) of the specimen were inked and microscopically evaluated. There is no mass grossly identified. Representative sections demonstrate predominantly benign fibrovascular and adipose tissue with focal areas of fibrocytic change and sclerosing adenosis. There are no atypical or malignant epithelial or stromal findings identified. 5. There are microscopic foci of intranodal metastatic mammary tumor deposits spanning up to 1 mm. There is focal extracapsular  extension identified. (CR:kh 11-25-12) [place] RUND DO Pathologist, Electronic Signature (Case signed 11/25/2012) Specimen Gross and Clinical Information   RADIOGRAPHIC STUDIES:  Chest 2 View  11/19/2012  *RADIOLOGY REPORT*  Clinical Data: Preop for breast cancer  CHEST - 2 VIEW  Comparison: None.  Findings: Cardiomediastinal silhouette is unremarkable.  No acute infiltrate or pleural effusion.  No pulmonary edema.  Bony thorax is unremarkable.  IMPRESSION: No active disease.   Original Report Authenticated By: Natasha Mead, M.D.    Nm Sentinel Node Inj-no Rpt (breast)  11/23/2012  CLINICAL DATA: Bilateral breast cancer   Sulfur colloid was injected intradermally by the nuclear medicine  technologist for breast cancer sentinel node localization.     Dg Chest Portable 1 View  11/23/2012  *RADIOLOGY REPORT*  Clinical Data: 52 year old female status post Port-A-Cath placement.  Breast cancer.  PORTABLE CHEST - 1 VIEW  Comparison: 11/19/2012.  Findings: Portable semi upright AP view 1441 hours.  Right chest subclavian approach Port-A-Cath placed.  Acute angulation of the catheter tubing at the level of the right axilla.  Tip at the level of the cavoatrial junction.  Interval postoperative changes to the right chest wall with subcutaneous appearing drain in place.  No pneumothorax.  No pleural effusion or pulmonary edema.  Mildly increased bibasilar opacity compatible with atelectasis.  Cardiac size and mediastinal contours are within normal limits.  Visualized tracheal air column is within normal limits.  IMPRESSION: 1.  Right subclavian approach chest Port-A-Cath in place.  Tip at the level of the cavoatrial junction.  Mildly angulated catheter tubing at the level of the axilla. 2.  Interval postoperative changes to the right chest wall. No pneumothorax. 3.  Mild dependent atelectasis.   Original Report Authenticated By: Erskine Speed, M.D.    Dg Fluoro Guide Cv Line-no Report  11/23/2012  CLINICAL DATA: bil  breast cancer   FLOURO GUIDE CV LINE  Fluoroscopy was utilized by the requesting physician.  No radiographic  interpretation.     Mm Lt Plc Breast Loc Dev   1st Lesion  Inc Mammo Guide  11/23/2012  *RADIOLOGY REPORT*  Clinical Data:  Left breast cancer  NEEDLE LOCALIZATION WITH MAMMOGRAPHIC GUIDANCE AND SPECIMEN RADIOGRAPH  Comparison:  Previous exams.  Patient presents for needle localization prior to surgery.  I met with the patient and we discussed the procedure of needle localization including benefits and alternatives. We discussed the high likelihood of a successful procedure. We discussed the risks of  the procedure, including infection, bleeding, tissue injury, and further surgery. Informed, written consent was given.  Using mammographic guidance, sterile technique, 2% lidocaine and a #9 modified Kopans needle, the mass and clip in the central aspect of the left breast was localized using a superior approach.  The films are marked for Dr. Derrell Lolling.  Specimen radiograph was performed at [facility], and confirms the mass and clip were present in the tissue sample. The clip was located at the margin of the surgical specimen.  Dr.  Derrell Lolling states she had taken more tissue at the margin to clear the border.  The specimen is marked for pathology.  IMPRESSION: Needle localization of the left breast.  No apparent complications.   Original Report Authenticated By: Baird Lyons, M.D.    Mm Rt Plc Breast Loc Dev   1st Lesion  Inc Mammo Guide  11/23/2012  *RADIOLOGY REPORT*  Clinical Data:  Known right breast cancer  NEEDLE LOCALIZATION WITH MAMMOGRAPHIC GUIDANCE AND SPECIMEN RADIOGRAPH  Comparison:  Previous exams.  Patient presents for needle localization prior to surgery.  I met with the patient and we discussed the procedure of needle localization including benefits and alternatives. We discussed the high likelihood of a successful procedure. We discussed the risks of the procedure, including infection, bleeding,  tissue injury, and further surgery. Informed, written consent was given.  Using mammographic guidance, sterile technique, 2% lidocaine and a #7 and #9 modified Kopans needles, the mass and nodularity in the upper outer quadrant of the right breast was bracketedusing a lateral approach.  The films are marked for Dr. Derrell Lolling.  Specimen radiograph was performed at Day [facility], and confirms the mass and clip are present in the tissue sample.  The specimen is marked for pathology.  IMPRESSION: Needle localization of the right breast.  No apparent complications.   Original Report Authenticated By: Baird Lyons, M.D.     ASSESSMENT: 52 year old female with  #1 bilateral breast cancers triple negative on the right side ER positive on the left side. She is status post bilateral lumpectomies.she is s/p is  Adriamycin Cytoxan given dose dense x4 cycles followed by 6 cycles ofTaxol carbo.  Taxol/ Carbo was discontinued after 6 cycles due to progressive neuropathy and hand foot syndrome.  Patient was then started on Gemzar Carbo every 2 weeks Cycle 1 on 04/28/13 with day 2 neulasta.    #2 Neuropathy--patient was on neurontin but developed swelling of the bilateral lower extremities.  Patient will continue super B complex , she has declined neurontin therapy  #3 anemia due to chemotherapy we will continue to monitor her   PLAN:   #1  Doing well following chemotherapy.  Labs are stable, I reviewed them with her in detail.    #2 Patient will return next week for her final cycle of Gemcitabine/Carbo.   #3 Patient will f/u with Dr. Rushie Chestnut today for radiation oncology evaluation.    All questions were answered. The patient knows to call the clinic with any problems, questions or concerns. We can certainly see the patient much sooner if necessary.  I spent 25 minutes counseling the patient face to face. The total time spent in the appointment was 30 minutes.    Cherie Ouch Lyn Hollingshead, NP Medical Oncology [facility] Phone: [phone]   ATTENDING'S ATTESTATION:  I personally reviewed patient's chart, examined patient myself, formulated the treatment plan as followed.    Clinically patient is doing well her blood work looks stable. She is tolerating  Gemzar carboplatinum rule out better than Taxol carboplatinum. Her neuropathy is stable. She also has been set up to be seen by Dr. Carmina Miller for adjuvant radiation [facility].  Drue Second, MD Medical/Oncology [facility] [phone] (beeper) [phone] (Office)  05/27/2013, 11:12 AM

## 2013-05-19 NOTE — Progress Notes (Signed)
Per [employer] they will not pay if the insurance won't pay. [employer] must pay some of the dates of services 01/14/13  12/31/12  01/28/13.

## 2013-05-21 ENCOUNTER — Encounter: Payer: Self-pay | Admitting: Oncology

## 2013-05-21 NOTE — Progress Notes (Signed)
Put [employer] disability form on nurse's desk. °

## 2013-05-24 ENCOUNTER — Encounter: Payer: Self-pay | Admitting: Oncology

## 2013-05-24 NOTE — Progress Notes (Signed)
Faxed disability form and clinical information to [facility] @ [phone].

## 2013-05-26 ENCOUNTER — Encounter: Payer: Self-pay | Admitting: Adult Health

## 2013-05-26 ENCOUNTER — Ambulatory Visit (HOSPITAL_BASED_OUTPATIENT_CLINIC_OR_DEPARTMENT_OTHER): Payer: BC Managed Care – PPO | Admitting: Adult Health

## 2013-05-26 ENCOUNTER — Ambulatory Visit (HOSPITAL_BASED_OUTPATIENT_CLINIC_OR_DEPARTMENT_OTHER): Payer: BC Managed Care – PPO

## 2013-05-26 ENCOUNTER — Ambulatory Visit: Payer: Self-pay | Admitting: Radiation Oncology

## 2013-05-26 ENCOUNTER — Encounter: Payer: Self-pay | Admitting: Oncology

## 2013-05-26 ENCOUNTER — Other Ambulatory Visit (HOSPITAL_BASED_OUTPATIENT_CLINIC_OR_DEPARTMENT_OTHER): Payer: BC Managed Care – PPO | Admitting: Lab

## 2013-05-26 VITALS — BP 134/87 | HR 112 | Temp 98.4°F | Resp 20 | Ht 63.0 in | Wt 193.0 lb

## 2013-05-26 DIAGNOSIS — C50919 Malignant neoplasm of unspecified site of unspecified female breast: Secondary | ICD-10-CM

## 2013-05-26 DIAGNOSIS — C50411 Malignant neoplasm of upper-outer quadrant of right female breast: Secondary | ICD-10-CM

## 2013-05-26 DIAGNOSIS — D6481 Anemia due to antineoplastic chemotherapy: Secondary | ICD-10-CM

## 2013-05-26 DIAGNOSIS — C50419 Malignant neoplasm of upper-outer quadrant of unspecified female breast: Secondary | ICD-10-CM

## 2013-05-26 DIAGNOSIS — G5790 Unspecified mononeuropathy of unspecified lower limb: Secondary | ICD-10-CM

## 2013-05-26 DIAGNOSIS — Z5111 Encounter for antineoplastic chemotherapy: Secondary | ICD-10-CM

## 2013-05-26 LAB — CBC WITH DIFFERENTIAL/PLATELET
Basophils Absolute: 0.1 10*3/uL (ref 0.0–0.1)
EOS%: 0.3 % (ref 0.0–7.0)
Eosinophils Absolute: 0.1 10*3/uL (ref 0.0–0.5)
HGB: 10 g/dL — ABNORMAL LOW (ref 11.6–15.9)
NEUT#: 10 10*3/uL — ABNORMAL HIGH (ref 1.5–6.5)
RDW: 16.8 % — ABNORMAL HIGH (ref 11.2–14.5)
WBC: 14.3 10*3/uL — ABNORMAL HIGH (ref 3.9–10.3)
lymph#: 1.9 10*3/uL (ref 0.9–3.3)

## 2013-05-26 LAB — COMPREHENSIVE METABOLIC PANEL (CC13)
Albumin: 3.4 g/dL — ABNORMAL LOW (ref 3.5–5.0)
Alkaline Phosphatase: 105 U/L (ref 40–150)
CO2: 24 mEq/L (ref 22–29)
Glucose: 114 mg/dl (ref 70–140)
Potassium: 3.4 mEq/L — ABNORMAL LOW (ref 3.5–5.1)
Sodium: 143 mEq/L (ref 136–145)
Total Protein: 6.1 g/dL — ABNORMAL LOW (ref 6.4–8.3)

## 2013-05-26 MED ORDER — SODIUM CHLORIDE 0.9 % IV SOLN
800.0000 mg/m2 | Freq: Once | INTRAVENOUS | Status: AC
  Administered 2013-05-26: 1558 mg via INTRAVENOUS
  Filled 2013-05-26: qty 40.98

## 2013-05-26 MED ORDER — SODIUM CHLORIDE 0.9 % IV SOLN
Freq: Once | INTRAVENOUS | Status: AC
  Administered 2013-05-26: 10:00:00 via INTRAVENOUS

## 2013-05-26 MED ORDER — DEXAMETHASONE SODIUM PHOSPHATE 10 MG/ML IJ SOLN
10.0000 mg | Freq: Once | INTRAMUSCULAR | Status: AC
  Administered 2013-05-26: 10 mg via INTRAVENOUS

## 2013-05-26 MED ORDER — SODIUM CHLORIDE 0.9 % IJ SOLN
10.0000 mL | INTRAMUSCULAR | Status: DC | PRN
  Administered 2013-05-26: 10 mL
  Filled 2013-05-26: qty 10

## 2013-05-26 MED ORDER — SODIUM CHLORIDE 0.9 % IV SOLN
300.0000 mg | Freq: Once | INTRAVENOUS | Status: AC
  Administered 2013-05-26: 300 mg via INTRAVENOUS
  Filled 2013-05-26: qty 30

## 2013-05-26 MED ORDER — ONDANSETRON 8 MG/50ML IVPB (CHCC)
8.0000 mg | Freq: Once | INTRAVENOUS | Status: AC
  Administered 2013-05-26: 8 mg via INTRAVENOUS

## 2013-05-26 MED ORDER — HEPARIN SOD (PORK) LOCK FLUSH 100 UNIT/ML IV SOLN
500.0000 [IU] | Freq: Once | INTRAVENOUS | Status: AC | PRN
  Administered 2013-05-26: 500 [IU]
  Filled 2013-05-26: qty 5

## 2013-05-26 MED ORDER — ONDANSETRON 8 MG/NS 50 ML IVPB
INTRAVENOUS | Status: AC
  Filled 2013-05-26: qty 8

## 2013-05-26 MED ORDER — DEXAMETHASONE SODIUM PHOSPHATE 10 MG/ML IJ SOLN
INTRAMUSCULAR | Status: AC
  Filled 2013-05-26: qty 1

## 2013-05-26 NOTE — Progress Notes (Signed)
OFFICE PROGRESS NOTE  CC  Amy Manns, MD [address] [place] [address] [place] [address] Dr. Claud Leonard  Dr. Antony Leonard  DIAGNOSIS: 52 year old female with new diagnosis of bilateral breast cancers.   STAGE:  Cancer of upper-outer quadrant of female breast  Primary site: Breast (Bilateral)  Staging method: AJCC 7th Edition  Clinical: Stage IA (T1c, N0, cM0)  Summary: Stage IA (T1c, N0, cM0)   PRIOR THERAPY: #1Patient recently underwent screening mammograms and she was found to have bilateral breast abnormalities.diagnostic mammogram showed in the right breast an ill-defined 2 cm mass with a few associated well defined microcalcifications over the upper outer quadrant. Spot compression images of the left breast demonstrated an ill defined spiculated 8 mm mass centrally. Ultrasound showed irregular bordered heterogeneous hypoechoic solid mass at the 10:00 position of the right breast 6 cm from the nipple measuring 1.1 x 1.3 x 1.4 cm. Also in this location was an adjacent smaller irregular hypoechoic mass measuring 3 x 5 x 6 mm. The smaller mass was 1 cm from the large more irregular mass. Ultrasound of the axilla demonstrated single lymph node with thickened cortex. Ultrasound of the left breast demonstrated an ill-defined hypoechoic mass with distal acoustic shadowing 7 to 8:00 position 3 cm from the nipple located deep and measuring 5 x 8 x 8 mm ultrasound of the left axilla showed normal appearing lymph nodes.   #2Patient went on to have needle core biopsies performed of both of the masses. 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 The tumor was ER positive PR positive with a Ki-67 of 9% the second tumor was ER negative PR negative HER-2/neu negative with Ki-6792% and elevated.  #3 Patient had MRIs of the breasts performed. There was  noted to be 1.7 cm irregular enhancing mass located within the upper-outer quadrant of the right breast with 3 adjacent worrisome enhancing satellite nodules in aggregate the nodules and mass measures 3.8 cm. In the left breast 8 mm irregular enhancing mass located within the central left breast was noted. No evidence of axillary or internal mammary adenopathy.  #4patient is status post bilateral lumpectomies. The right breast revealed a 1.5 cm grade 3 invasive ductal carcinoma ER/PR negative HER-2/neu negative Ki-67 92%. Sentinel node was negative. Left breast showed 1.0 cm grade 3 invasive ductal carcinoma ER/PR positive HER-2/neu negative sentinel node was negative.  #5 s/p adjuvant chemotherapy consisting of Adriamycin Cytoxan every 2 weeks for a total of 4 cycles 12/30/12 - 02/17/13  She received 6 cycles of Taxol carbo from 03/10/13 - 04/14/13 and it was discontinued due to progressive neuropathy and hand foot syndrome.  She was then started on Gemzar Carbo 04/28/13 and will receive 3 cycles of this.  Patient will need adjuvant radiation therapy. Because one of the tumor is ER positive she will also receive antiestrogen therapy.  CURRENT THERAPY: Gemzar Carbo cycle 3 day 1  INTERVAL HISTORY: Amy Leonard 52 y.o. female returns for followup visit prior to her final Gemzar/carbo.  She is doing well.  She met with Dr. Aggie Leonard in radiation oncology at [facility] and is scheduled to undergo simulation on 06/07/13.  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  She does have mild nasal drainage and productive cough that is consistent with her seasonal  bronchitis she gets annually.  Otherwise, she denies fevers, chills, nausea, vomiting, constipation, diarrhea, or any further concerns.    MEDICAL HISTORY: Past Medical History  Diagnosis Date  . Elevated WBC count     nl diff  . Chronic back pain     spinal stenosis  . Tobacco abuse   .  Anxiety and depression   . HSV infection     recurrent (side and buttox)  . Migraine   . Glaucoma     ?  . Carpal tunnel syndrome   . HLD (hyperlipidemia)   . Folliculitis 05/05/2011  . Anxiety   . Depression   . 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 GERD (gastroesophageal reflux disease)   . Hypertension   . Breast cancer   . Wears dentures     top    ALLERGIES:  is allergic to bupropion hcl; chlorhexidine gluconate; hydrocod polst-cpm polst er; lisinopril; and naproxen sodium.  MEDICATIONS:  Current Outpatient Prescriptions  Medication Sig Dispense Refill  . Acetaminophen (TYLENOL ARTHRITIS PAIN PO) Take 2 tablets by mouth as needed (pain).       Marland Kitchen aspirin 81 MG chewable tablet Chew 81 mg by mouth daily.      . ATIVAN 0.5 MG tablet Take 1 tablet (0.5 mg total) by mouth every 6 (six) hours as needed (Nausea or vomiting).  45 tablet  0  . atorvastatin (LIPITOR) 10 MG tablet TAKE 1 TABLET (10 MG TOTAL) BY MOUTH DAILY.  90 tablet  0  . calcium carbonate (TUMS - DOSED IN MG ELEMENTAL CALCIUM) 500 MG chewable tablet Chew by mouth as directed.        . ciprofloxacin (CIPRO) 500 MG tablet Take 1 tablet by mouth 2 (two) times daily.      . COMPRO 25 MG suppository       . dexamethasone (DECADRON) 4 MG tablet Take 1 tablet (4 mg total) by mouth as directed. 2 tablets PO the day after chemo then 2 tablets PO BID x 2 days.  30 tablet  4  . lidocaine-prilocaine (EMLA) cream Apply topically as needed.  30 g  8  . losartan (COZAAR) 50 MG tablet Take 1 tablet (50 mg total) by mouth daily.  90 tablet  2  . nicotine (NICODERM CQ - DOSED IN MG/24 HOURS) 21 mg/24hr patch Place 1 patch onto the skin daily.  28 patch  0  . nystatin (MYCOSTATIN) 100000 UNIT/ML suspension Take 5 mLs (500,000 Units total) by mouth 2 (two) times daily.  240 mL  3  . omeprazole (PRILOSEC) 40 MG capsule Take 1 capsule (40 mg total) by mouth 2 (two) times daily.  180 capsule  3  . ondansetron (ZOFRAN) 8 MG tablet       .  prochlorperazine (COMPAZINE) 10 MG tablet       . simethicone (MYLICON) 80 MG chewable tablet Chew 1 tablet (80 mg total) by mouth every 8 (eight) hours as needed for flatulence.  60 tablet  3  . tobramycin-dexamethasone (TOBRADEX) ophthalmic solution PLACE 1 DROP INTO BOTH EYES EVERY 4 (FOUR) HOURS WHILE AWAKE.  10 mL  0  . UNABLE TO FIND Rx: L8000-Mastectomy Bra (Quantity: 2) Dx: 174.9 Bilateral Lumpectomy  1 each  0  . acyclovir ointment (ZOVIRAX) 5 % Apply 1 application topically as needed (flare-up).  15 g  1   No current facility-administered medications for this visit.   Facility-Administered Medications Ordered in Other Visits  Medication Dose Route Frequency Provider Last Rate  Last Dose  . CARBOplatin (PARAPLATIN) 300 mg in sodium chloride 0.9 % 100 mL chemo infusion  300 mg Intravenous Once Augustin Schooling, NP      . Gemcitabine HCl (GEMZAR) 1,558 mg in sodium chloride 0.9 % 100 mL chemo infusion  800 mg/m2 (Treatment Plan Actual) Intravenous Once Augustin Schooling, NP 282 mL/hr at 05/26/13 1114 1,558 mg at 05/26/13 1114  . heparin lock flush 100 unit/mL  500 Units Intracatheter Once PRN Augustin Schooling, NP      . sodium chloride 0.9 % injection 10 mL  10 mL Intracatheter PRN Augustin Schooling, NP        SURGICAL HISTORY:  Past Surgical History  Procedure Laterality Date  . Btl    . Epidural steroid injection  2008  . Colonoscopy    . Upper gi endoscopy    . Partial mastectomy with needle localization and axillary sentinel lymph node bx Bilateral 11/23/2012    Procedure: PARTIAL MASTECTOMY WITH NEEDLE LOCALIZATION AND AXILLARY SENTINEL LYMPH NODE BX;  Surgeon: Ernestene Mention, MD;  Location: [facility];  Service: General;  Laterality: Bilateral;  . Portacath placement Right 11/23/2012    Procedure: INSERTION PORT-A-CATH;  Surgeon: Ernestene Mention, MD;  Location: [facility];  Service: General;  Laterality: Right;    REVIEW OF SYSTEMS:  A 10  point review of systems was conducted and is otherwise negative except for what is noted above.     PHYSICAL EXAMINATION: Blood pressure 134/87, pulse 112, temperature 98.4 F (36.9 C), temperature source Oral, resp. rate 20, height 5\' 3"  (1.6 m), weight 193 lb (87.544 kg). Body mass index is 34.2 kg/(m^2). General: Patient is a well appearing female in no acute distress HEENT: PERRLA, no scleral injection, crusting on lashes, or periorbital noted.  sclerae anicteric no conjunctival pallor, MMM Neck: supple, no palpable adenopathy Lungs: clear to auscultation bilaterally, no wheezes, rhonchi, or rales Cardiovascular: regular rhythm, S1, S2, no murmurs, rubs or gallops Abdomen: Soft, non-tender, non-distended, normoactive bowel sounds, no HSM Extremities: warm and well perfused,no edema Skin: No rashes or lesions Neuro: Non-focal Breasts: Bilateral lumpectomy sites without nodularity, no masses or skin changes in either breast. ECOG PERFORMANCE STATUS: 0 - Asymptomatic  LABORATORY DATA: Lab Results  Component Value Date   WBC 14.3* 05/26/2013   HGB 10.0* 05/26/2013   HCT 30.0* 05/26/2013   MCV 102.0* 05/26/2013   PLT 197 05/26/2013      Chemistry      Component Value Date/Time   NA 143 05/26/2013 0812   NA 135 01/08/2013 0555   K 3.4* 05/26/2013 0812   K 4.0 01/08/2013 0555   CL 109* 02/17/2013 1120   CL 100 01/08/2013 0555   CO2 24 05/26/2013 0812   CO2 28 01/08/2013 0555   BUN 7.1 05/26/2013 0812   BUN 10 01/08/2013 0555   CREATININE 0.7 05/26/2013 0812   CREATININE 0.62 01/08/2013 0555      Component Value Date/Time   CALCIUM 9.3 05/26/2013 0812   CALCIUM 9.1 01/08/2013 0555   ALKPHOS 105 05/26/2013 0812   ALKPHOS 67 01/05/2013 2245   AST 24 05/26/2013 0812   AST 10 01/05/2013 2245   ALT 29 05/26/2013 0812   ALT 15 01/05/2013 2245   BILITOT 0.30 05/26/2013 0812   BILITOT 0.5 01/05/2013 2245    ADDITIONAL INFORMATION: 1. CHROMOGENIC IN-SITU HYBRIDIZATION Results: HER-2/NEU BY CISH -  NO AMPLIFICATION OF HER-2 DETECTED. RESULT RATIO OF HER2: CEP 17 SIGNALS 1.00 AVERAGE HER2  COPY NUMBER PER CELL 1.55 REFERENCE RANGE NEGATIVE HER2/Chr17 Ratio <2.0 and Average HER2 copy number <4.0 EQUIVOCAL HER2/Chr17 Ratio <2.0 and Average HER2 copy number 4.0 and <6.0 POSITIVE HER2/Chr17 Ratio >=2.0 and/or Average HER2 copy number >=6.0 Pecola Leisure MD Pathologist, Electronic Signature ( Signed 12/01/2012) 3. CHROMOGENIC IN-SITU HYBRIDIZATION Results: HER-2/NEU BY CISH - NO AMPLIFICATION OF HER-2 DETECTED. RESULT RATIO OF HER2: CEP 17 SIGNALS 1.22 AVERAGE HER2 COPY NUMBER PER CELL 1.40 REFERENCE RANGE NEGATIVE HER2/Chr17 Ratio <2.0 and Average HER2 copy number <4.0 EQUIVOCAL HER2/Chr17 Ratio <2.0 and Average HER2 copy number 4.0 and <6.0 POSITIVE HER2/Chr17 Ratio >=2.0 and/or Average HER2 copy number >=6.0 1 of 5 FINAL for Amy Leonard, Amy Leonard ([id number]) ADDITIONAL INFORMATION:(continued) Pecola Leisure MD Pathologist, Electronic Signature ( Signed 12/01/2012) FINAL DIAGNOSIS Diagnosis 1. Breast, lumpectomy, Right - INVASIVE DUCTAL CARCINOMA, GRADE III, SEE COMMENT. - INVASIVE CARCINOMA IS 0.9 CM FROM NEAREST MARGIN (INFERIOR). - NO LYMPHOVASCULAR INVASION IDENTIFIED. - HIGH GRADE DUCTAL CARCINOMA IN SITU - IN SITU CARCINOMA IS 1 MM FROM THE NEAREST LATERAL MARGIN - SEE TUMOR SYNOPTIC TEMPLATE BELOW. 2. Lymph nodes, regional resection, Right axilla - ELEVEN LYMPH NODES, NEGATIVE FOR TUMOR (0/11). 3. Breast, lumpectomy, Left - INVASIVE DUCTAL CARCINOMA, GRADE I, SEE COMMENT. - LYMPHOVASCULAR INVASION IDENTIFIED - INVASIVE CARCINOMA IS BROADLY PRESENT AT POSTERIOR MARGIN - DUCTAL CARCINOMA IN SITU, GRADE I. - IN SITU CARCINOMA IS FOCALLY PRESENT AT POSTERIOR MARGIN - SEE TUMOR SYNOPTIC TEMPLATE BELOW. 4. Breast, excision, Left - BENIGN BREAST TISSUE, SEE COMMENT. - NEGATIVE FOR ATYPIA OR MALIGNANCY. - SURGICAL MARGIN, NEGATIVE FOR ATYPIA OR MALIGNANCY. 5. Lymph node,  sentinel, biopsy, Left axillary - ONE LYMPH NODE, POSITIVE FOR METASTATIC MAMMARY CARCINOMA (1/1). Microscopic Comment 1. BREAST, INVASIVE TUMOR, WITH LYMPH NODE SAMPLING Specimen, including laterality: Right breast Procedure: Lumpectomy Grade: III of III Tubule formation: 3 Nuclear pleomorphism: 3 Mitotic: 3 Tumor size (gross measurement: 1.5 cm Margins: Invasive, distance to closest margin: 0.9 cm In-situ, distance to closest margin: 0.1 cm If margin positive, focally or broadly: N/A Lymphovascular invasion: Absent Ductal carcinoma in situ: Present Grade: III of III Extensive intraductal component: Absent Lobular neoplasia: Absent Tumor focality: Multifocal Treatment effect: None 2 of 5 FINAL for Amy Leonard, Amy Leonard ([id number]) Microscopic Comment(continued) If present, treatment effect in breast tissue, lymph nodes or both: N/A Extent of tumor: Skin: N/A Nipple: N/A Skeletal muscle: N/A Lymph nodes: # examined: 11 (Part 2) Lymph nodes with metastasis: 0 Breast prognostic profile: Estrogen receptor: Repeated, previous study demonstrated 0% positivity ([id number]) Progesterone receptor: Repeated, previous study demonstrated 0% positivity ([id number]) Her 2 neu: Repeated, previous study demonstrated no amplification (1.38) ([id number]) Ki-67: Not repeated, previous study demonstrated 92% proliferation rate ([id number]) Non-neoplastic breast: Previous biopsy site, fibrocystic change, sclerosing adenosis and benign calcifications in benign ducts and lobules. TNM: pT1c, pN0, pMX Comments: There are a total of four distinct areas of abnormality identified. The first 1.5 cm corresponds to the invasive ductal carcinoma. The second 0.9 cm area consists of high grade ductal carcinoma in situ with associated fibrocystic change and fibroadenomatoid nodules. In this focus, the in situ carcinoma is 1 mm from the nearest lateral margin. The third 0.4 cm area consists of fat necrosis  and fibrosis. There are no atypical or malignant findings in this area. The fourth and final 0.3 cm area demonstrate non-neoplastic findings to include fibrocystic change. There are no atypical or malignant epithelial findings in this area either. 3. BREAST, INVASIVE TUMOR, WITH LYMPH NODE SAMPLING Specimen, including laterality: Left breast Procedure:  Lumpectomy Grade: I of III Tubule formation: 3 Nuclear pleomorphism: 3 Mitotic: 1 Tumor size (gross measurement): 1.0 cm Margins: Invasive, distance to closest margin: Present at posterior In-situ, distance to closest margin: Present at posterior If margin positive, focally or broadly: Broadly (invasive) and focally (in situ) Lymphovascular invasion: Present Ductal carcinoma in situ: Present Grade: I of III Extensive intraductal component: Absent Lobular neoplasia: Absent Tumor focality: Unifocal Treatment effect: None If present, treatment effect in breast tissue, lymph nodes or both: N/A Extent of tumor: Skin: N/A Nipple: N/A Skeletal muscle: N/A Lymph nodes: # examined: 1 (Part 5) Lymph nodes with metastasis: 1 Isolated tumor cells (< 0.2 mm): 0 Micrometastasis: (> 0.2 mm and < 2.0 mm): 1 Macrometastasis: (> 2.0 mm): 0 3 of 5 FINAL for Amy Leonard, Amy Leonard ([id number]) Microscopic Comment(continued) Extracapsular extension: Present Breast prognostic profile: Estrogen receptor: Not repeated, previous study demonstrated 100% positivity ([id number]). Progesterone receptor: Not repeated, previous study demonstrated 25% positivity ([id number]) Her 2 neu: Repeated, previous study demonstrated no amplification (1.42) ([id number]) Ki-67: Not repeated, previous study demonstrated 9% proliferation rate ([id number]) Non-neoplastic breast: Previous biopsy site, fibrocystic change and microcalcifications in benign ducts and lobules. TNM: pT1b, pN71mi, PMX Comments: The additional 1.0 cm and 1.1 cm hemorrhagic areas grossly identified  consist of benign breast tissue with microcalcifications and parenchymal hemorrhage. There are no atypical or malignant findings in these areas. 4. The surgical resection margin(s) of the specimen were inked and microscopically evaluated. There is no mass grossly identified. Representative sections demonstrate predominantly benign fibrovascular and adipose tissue with focal areas of fibrocytic change and sclerosing adenosis. There are no atypical or malignant epithelial or stromal findings identified. 5. There are microscopic foci of intranodal metastatic mammary tumor deposits spanning up to 1 mm. There is focal extracapsular extension identified. (CR:kh 11-25-12) [place] RUND DO Pathologist, Electronic Signature (Case signed 11/25/2012) Specimen Gross and Clinical Information   RADIOGRAPHIC STUDIES:  Chest 2 View  11/19/2012  *RADIOLOGY REPORT*  Clinical Data: Preop for breast cancer  CHEST - 2 VIEW  Comparison: None.  Findings: Cardiomediastinal silhouette is unremarkable.  No acute infiltrate or pleural effusion.  No pulmonary edema.  Bony thorax is unremarkable.  IMPRESSION: No active disease.   Original Report Authenticated By: Natasha Mead, M.D.    Nm Sentinel Node Inj-no Rpt (breast)  11/23/2012  CLINICAL DATA: Bilateral breast cancer   Sulfur colloid was injected intradermally by the nuclear medicine  technologist for breast cancer sentinel node localization.     Dg Chest Portable 1 View  11/23/2012  *RADIOLOGY REPORT*  Clinical Data: 52 year old female status post Port-A-Cath placement.  Breast cancer.  PORTABLE CHEST - 1 VIEW  Comparison: 11/19/2012.  Findings: Portable semi upright AP view 1441 hours.  Right chest subclavian approach Port-A-Cath placed.  Acute angulation of the catheter tubing at the level of the right axilla.  Tip at the level of the cavoatrial junction.  Interval postoperative changes to the right chest wall with subcutaneous appearing drain in place.  No pneumothorax.   No pleural effusion or pulmonary edema.  Mildly increased bibasilar opacity compatible with atelectasis.  Cardiac size and mediastinal contours are within normal limits.  Visualized tracheal air column is within normal limits.  IMPRESSION: 1.  Right subclavian approach chest Port-A-Cath in place.  Tip at the level of the cavoatrial junction.  Mildly angulated catheter tubing at the level of the axilla. 2.  Interval postoperative changes to the right chest wall. No pneumothorax. 3.  Mild dependent atelectasis.  Original Report Authenticated By: Erskine Speed, M.D.    Dg Fluoro Guide Cv Line-no Report  11/23/2012  CLINICAL DATA: bil breast cancer   FLOURO GUIDE CV LINE  Fluoroscopy was utilized by the requesting physician.  No radiographic  interpretation.     Mm Lt Plc Breast Loc Dev   1st Lesion  Inc Mammo Guide  11/23/2012  *RADIOLOGY REPORT*  Clinical Data:  Left breast cancer  NEEDLE LOCALIZATION WITH MAMMOGRAPHIC GUIDANCE AND SPECIMEN RADIOGRAPH  Comparison:  Previous exams.  Patient presents for needle localization prior to surgery.  I met with the patient and we discussed the procedure of needle localization including benefits and alternatives. We discussed the high likelihood of a successful procedure. We discussed the risks of the procedure, including infection, bleeding, tissue injury, and further surgery. Informed, written consent was given.  Using mammographic guidance, sterile technique, 2% lidocaine and a #9 modified Kopans needle, the mass and clip in the central aspect of the left breast was localized using a superior approach.  The films are marked for Dr. Derrell Lolling.  Specimen radiograph was performed at Day Surgery, and confirms the mass and clip were present in the tissue sample. The clip was located at the margin of the surgical specimen.  Dr.  Derrell Lolling states she had taken more tissue at the margin to clear the border.  The specimen is marked for pathology.  IMPRESSION: Needle localization of the  left breast.  No apparent complications.   Original Report Authenticated By: Baird Lyons, M.D.    Mm Rt Plc Breast Loc Dev   1st Lesion  Inc Mammo Guide  11/23/2012  *RADIOLOGY REPORT*  Clinical Data:  Known right breast cancer  NEEDLE LOCALIZATION WITH MAMMOGRAPHIC GUIDANCE AND SPECIMEN RADIOGRAPH  Comparison:  Previous exams.  Patient presents for needle localization prior to surgery.  I met with the patient and we discussed the procedure of needle localization including benefits and alternatives. We discussed the high likelihood of a successful procedure. We discussed the risks of the procedure, including infection, bleeding, tissue injury, and further surgery. Informed, written consent was given.  Using mammographic guidance, sterile technique, 2% lidocaine and a #7 and #9 modified Kopans needles, the mass and nodularity in the upper outer quadrant of the right breast was bracketedusing a lateral approach.  The films are marked for Dr. Derrell Lolling.  Specimen radiograph was performed at Day Surgery, and confirms the mass and clip are present in the tissue sample.  The specimen is marked for pathology.  IMPRESSION: Needle localization of the right breast.  No apparent complications.   Original Report Authenticated By: Baird Lyons, M.D.     ASSESSMENT: 52 year old female with  #1 bilateral breast cancers triple negative on the right side ER positive on the left side. She is status post bilateral lumpectomies.she is s/p is  Adriamycin Cytoxan given dose dense x4 cycles followed by 6 cycles ofTaxol carbo.  Taxol/ Carbo was discontinued after 6 cycles due to progressive neuropathy and hand foot syndrome.  Patient was then started on Gemzar Carbo every 2 weeks on 04/28/13 with day 2 neulasta.  She will receive 3 cycles of this.    #2 Neuropathy--patient was on neurontin but developed swelling of the bilateral lower extremities.  Patient will continue super B complex , she has declined neurontin therapy  #3 anemia  due to chemotherapy we will continue to monitor her   PLAN:   #1  Patient is doing well today.  She will proceed with her  final Gemzar/carbo.  Her labs are stable, I reviewed them with her in detail.     #2 Patient will return next week for labs and evaluation following chemotherapy.   #3 Patient will proceed with radiation simulation under the care of Dr. Aggie Leonard on 06/07/13.    All questions were answered. The patient knows to call the clinic with any problems, questions or concerns. We can certainly see the patient much sooner if necessary.  I spent 25 minutes counseling the patient face to face. The total time spent in the appointment was 30 minutes.    Cherie Ouch Lyn Hollingshead, NP Medical Oncology [facility] Phone: [phone]

## 2013-05-26 NOTE — Progress Notes (Signed)
Notified Candida Peeling about patients heart rate being since since mid August.

## 2013-05-26 NOTE — Patient Instructions (Addendum)
PORT NEEDS TO BE FLUSHED EVERY 6-8 WEEKS.      Cancer Center Discharge Instructions for Patients Receiving Chemotherapy  Today you received the following chemotherapy agents: gemzar, carboplatin  To help prevent nausea and vomiting after your treatment, we encourage you to take your nausea medication.  Take it as often as prescribed.     If you develop nausea and vomiting that is not controlled by your nausea medication, call the clinic. If it is after clinic hours your family physician or the after hours number for the clinic or go to the Emergency Department.   BELOW ARE SYMPTOMS THAT SHOULD BE REPORTED IMMEDIATELY:  *FEVER GREATER THAN 100.5 F  *CHILLS WITH OR WITHOUT FEVER  NAUSEA AND VOMITING THAT IS NOT CONTROLLED WITH YOUR NAUSEA MEDICATION  *UNUSUAL SHORTNESS OF BREATH  *UNUSUAL BRUISING OR BLEEDING  TENDERNESS IN MOUTH AND THROAT WITH OR WITHOUT PRESENCE OF ULCERS  *URINARY PROBLEMS  *BOWEL PROBLEMS  UNUSUAL RASH Items with * indicate a potential emergency and should be followed up as soon as possible.  Feel free to call the clinic you have any questions or concerns. The clinic phone number is [phone].   I have been informed and understand all the instructions given to me. I know to contact the clinic, my physician, or go to the Emergency Department if any problems should occur. I do not have any questions at this time, but understand that I may call the clinic during office hours   should I have any questions or need assistance in obtaining follow up care.    __________________________________________  _____________  __________ Signature of Patient or Authorized Representative            Date                   Time    __________________________________________ Nurse's Signature

## 2013-05-27 ENCOUNTER — Other Ambulatory Visit: Payer: Self-pay | Admitting: Adult Health

## 2013-05-27 ENCOUNTER — Ambulatory Visit (HOSPITAL_BASED_OUTPATIENT_CLINIC_OR_DEPARTMENT_OTHER): Payer: BC Managed Care – PPO

## 2013-05-27 ENCOUNTER — Ambulatory Visit: Payer: BC Managed Care – PPO

## 2013-05-27 VITALS — BP 145/76 | HR 108 | Temp 98.6°F

## 2013-05-27 DIAGNOSIS — C50919 Malignant neoplasm of unspecified site of unspecified female breast: Secondary | ICD-10-CM

## 2013-05-27 DIAGNOSIS — C50419 Malignant neoplasm of upper-outer quadrant of unspecified female breast: Secondary | ICD-10-CM

## 2013-05-27 MED ORDER — PEGFILGRASTIM INJECTION 6 MG/0.6ML
6.0000 mg | Freq: Once | SUBCUTANEOUS | Status: AC
  Administered 2013-05-27: 6 mg via SUBCUTANEOUS
  Filled 2013-05-27: qty 0.6

## 2013-06-02 ENCOUNTER — Other Ambulatory Visit (HOSPITAL_BASED_OUTPATIENT_CLINIC_OR_DEPARTMENT_OTHER): Payer: BC Managed Care – PPO | Admitting: Lab

## 2013-06-02 ENCOUNTER — Encounter (INDEPENDENT_AMBULATORY_CARE_PROVIDER_SITE_OTHER): Payer: Self-pay

## 2013-06-02 ENCOUNTER — Ambulatory Visit (HOSPITAL_BASED_OUTPATIENT_CLINIC_OR_DEPARTMENT_OTHER): Payer: BC Managed Care – PPO | Admitting: Oncology

## 2013-06-02 ENCOUNTER — Telehealth: Payer: Self-pay | Admitting: *Deleted

## 2013-06-02 VITALS — BP 135/80 | HR 112 | Temp 98.3°F | Resp 19 | Ht 63.0 in | Wt 191.2 lb

## 2013-06-02 DIAGNOSIS — C50411 Malignant neoplasm of upper-outer quadrant of right female breast: Secondary | ICD-10-CM

## 2013-06-02 DIAGNOSIS — D6481 Anemia due to antineoplastic chemotherapy: Secondary | ICD-10-CM

## 2013-06-02 DIAGNOSIS — C50419 Malignant neoplasm of upper-outer quadrant of unspecified female breast: Secondary | ICD-10-CM

## 2013-06-02 DIAGNOSIS — C50919 Malignant neoplasm of unspecified site of unspecified female breast: Secondary | ICD-10-CM

## 2013-06-02 DIAGNOSIS — G609 Hereditary and idiopathic neuropathy, unspecified: Secondary | ICD-10-CM

## 2013-06-02 LAB — CBC WITH DIFFERENTIAL/PLATELET
BASO%: 0.8 % (ref 0.0–2.0)
Basophils Absolute: 0.2 10*3/uL — ABNORMAL HIGH (ref 0.0–0.1)
Eosinophils Absolute: 0 10*3/uL (ref 0.0–0.5)
HCT: 27.8 % — ABNORMAL LOW (ref 34.8–46.6)
HGB: 9.6 g/dL — ABNORMAL LOW (ref 11.6–15.9)
LYMPH%: 7.4 % — ABNORMAL LOW (ref 14.0–49.7)
MCV: 102.3 fL — ABNORMAL HIGH (ref 79.5–101.0)
MONO%: 5.4 % (ref 0.0–14.0)
NEUT#: 22.1 10*3/uL — ABNORMAL HIGH (ref 1.5–6.5)
NEUT%: 86.3 % — ABNORMAL HIGH (ref 38.4–76.8)
Platelets: 199 10*3/uL (ref 145–400)
RDW: 17.3 % — ABNORMAL HIGH (ref 11.2–14.5)

## 2013-06-02 LAB — COMPREHENSIVE METABOLIC PANEL (CC13)
ALT: 35 U/L (ref 0–55)
Albumin: 3.6 g/dL (ref 3.5–5.0)
Alkaline Phosphatase: 156 U/L — ABNORMAL HIGH (ref 40–150)
BUN: 10.1 mg/dL (ref 7.0–26.0)
Calcium: 9.3 mg/dL (ref 8.4–10.4)
Creatinine: 0.7 mg/dL (ref 0.6–1.1)
Glucose: 109 mg/dl (ref 70–140)
Potassium: 3.4 mEq/L — ABNORMAL LOW (ref 3.5–5.1)
Total Bilirubin: 0.39 mg/dL (ref 0.20–1.20)

## 2013-06-02 NOTE — Progress Notes (Signed)
OFFICE PROGRESS NOTE  CC  [name], MD [address] [place] [address], [place] [address] Dr. Claud Kelp  Dr. Antony Blackbird  DIAGNOSIS: 52 year old female with new diagnosis of bilateral breast cancers.   STAGE:  Cancer of upper-outer quadrant of female breast  Primary site: Breast (Bilateral)  Staging method: AJCC 7th Edition  Clinical: Stage IA (T1c, N0, cM0)  Summary: Stage IA (T1c, N0, cM0)   PRIOR THERAPY: #1Patient recently underwent screening mammograms and she was found to have bilateral breast abnormalities.diagnostic mammogram showed in the right breast an ill-defined 2 cm mass with a few associated well defined microcalcifications over the upper outer quadrant. Spot compression images of the left breast demonstrated an ill defined spiculated 8 mm mass centrally. Ultrasound showed irregular bordered heterogeneous hypoechoic solid mass at the 10:00 position of the right breast 6 cm from the nipple measuring 1.1 x 1.3 x 1.4 cm. Also in this location was an adjacent smaller irregular hypoechoic mass measuring 3 x 5 x 6 mm. The smaller mass was 1 cm from the large more irregular mass. Ultrasound of the axilla demonstrated single lymph node with thickened cortex. Ultrasound of the left breast demonstrated an ill-defined hypoechoic mass with distal acoustic shadowing 7 to 8:00 position 3 cm from the nipple located deep and measuring 5 x 8 x 8 mm ultrasound of the left axilla showed normal appearing lymph nodes.   #2Patient went on to have needle core biopsies performed of both of the masses. 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 The tumor was ER positive PR positive with a Ki-67 of 9% the second tumor was ER negative PR negative HER-2/neu negative with Ki-6792% and elevated.  #3 Patient had MRIs of the breasts performed. There was  noted to be 1.7 cm irregular enhancing mass located within the upper-outer quadrant of the right breast with 3 adjacent worrisome enhancing satellite nodules in aggregate the nodules and mass measures 3.8 cm. In the left breast 8 mm irregular enhancing mass located within the central left breast was noted. No evidence of axillary or internal mammary adenopathy.  #4patient is status post bilateral lumpectomies. The right breast revealed a 1.5 cm grade 3 invasive ductal carcinoma ER/PR negative HER-2/neu negative Ki-67 92%. Sentinel node was negative. Left breast showed 1.0 cm grade 3 invasive ductal carcinoma ER/PR positive HER-2/neu negative sentinel node was negative.  #5 s/p adjuvant chemotherapy consisting of Adriamycin Cytoxan every 2 weeks for a total of 4 cycles 12/30/12 - 02/17/13  She received 6 cycles of Taxol carbo from 03/10/13 - 04/14/13 and it was discontinued due to progressive neuropathy and hand foot syndrome.  She was then started on Gemzar Carbo 04/28/13 and will receive 3 cycles of this.  Patient will need adjuvant radiation therapy. Because one of the tumor is ER positive she will also receive antiestrogen therapy.  CURRENT THERAPY: Gemzar Carbo cycle 3 day 1  INTERVAL HISTORY: Amy Leonard 52 y.o. female returns for followup visit after  her final Gemzar/carbo.  She is doing well.  She met with Dr. Aggie Cosier in radiation oncology at [facility] and is scheduled to undergo simulation on 06/07/13.  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  She does have mild nasal drainage and productive cough that is consistent with her seasonal  bronchitis she gets annually.  Otherwise, she denies fevers, chills, nausea, vomiting, constipation, diarrhea, or any further concerns.    MEDICAL HISTORY: Past Medical History  Diagnosis Date  . Elevated WBC count     nl diff  . Chronic back pain     spinal stenosis  . Tobacco abuse   . Anxiety  and depression   . HSV infection     recurrent (side and buttox)  . Migraine   . Glaucoma     ?  . Carpal tunnel syndrome   . HLD (hyperlipidemia)   . Folliculitis 05/05/2011  . Anxiety   . Depression   . 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 GERD (gastroesophageal reflux disease)   . Hypertension   . Breast cancer   . Wears dentures     top    ALLERGIES:  is allergic to bupropion hcl; chlorhexidine gluconate; hydrocod polst-cpm polst er; lisinopril; and naproxen sodium.  MEDICATIONS:  Current Outpatient Prescriptions  Medication Sig Dispense Refill  . Acetaminophen (TYLENOL ARTHRITIS PAIN PO) Take 2 tablets by mouth as needed (pain).       Marland Kitchen acyclovir ointment (ZOVIRAX) 5 % Apply 1 application topically as needed (flare-up).  15 g  1  . aspirin 81 MG chewable tablet Chew 81 mg by mouth daily.      . ATIVAN 0.5 MG tablet Take 1 tablet (0.5 mg total) by mouth every 6 (six) hours as needed (Nausea or vomiting).  45 tablet  0  . atorvastatin (LIPITOR) 10 MG tablet TAKE 1 TABLET (10 MG TOTAL) BY MOUTH DAILY.  90 tablet  0  . calcium carbonate (TUMS - DOSED IN MG ELEMENTAL CALCIUM) 500 MG chewable tablet Chew by mouth as directed.        . ciprofloxacin (CIPRO) 500 MG tablet Take 1 tablet by mouth 2 (two) times daily.      . COMPRO 25 MG suppository       . dexamethasone (DECADRON) 4 MG tablet Take 1 tablet (4 mg total) by mouth as directed. 2 tablets PO the day after chemo then 2 tablets PO BID x 2 days.  30 tablet  4  . lidocaine-prilocaine (EMLA) cream Apply topically as needed.  30 g  8  . losartan (COZAAR) 50 MG tablet Take 1 tablet (50 mg total) by mouth daily.  90 tablet  2  . nicotine (NICODERM CQ - DOSED IN MG/24 HOURS) 21 mg/24hr patch Place 1 patch onto the skin daily.  28 patch  0  . nystatin (MYCOSTATIN) 100000 UNIT/ML suspension Take 5 mLs (500,000 Units total) by mouth 2 (two) times daily.  240 mL  3  . omeprazole (PRILOSEC) 40 MG capsule Take 1 capsule (40 mg total) by  mouth 2 (two) times daily.  180 capsule  3  . ondansetron (ZOFRAN) 8 MG tablet       . prochlorperazine (COMPAZINE) 10 MG tablet       . simethicone (MYLICON) 80 MG chewable tablet Chew 1 tablet (80 mg total) by mouth every 8 (eight) hours as needed for flatulence.  60 tablet  3  . tobramycin-dexamethasone (TOBRADEX) ophthalmic solution PLACE 1 DROP INTO BOTH EYES EVERY 4 (FOUR) HOURS WHILE AWAKE.  10 mL  0  . UNABLE TO FIND Rx: L8000-Mastectomy Bra (Quantity: 2) Dx: 174.9 Bilateral Lumpectomy  1 each  0   No current facility-administered medications for this visit.    SURGICAL HISTORY:  Past Surgical History  Procedure Laterality Date  . Btl    .  Epidural steroid injection  2008  . Colonoscopy    . Upper gi endoscopy    . Partial mastectomy with needle localization and axillary sentinel lymph node bx Bilateral 11/23/2012    Procedure: PARTIAL MASTECTOMY WITH NEEDLE LOCALIZATION AND AXILLARY SENTINEL LYMPH NODE BX;  Surgeon: Ernestene Mention, MD;  Location: [facility];  Service: General;  Laterality: Bilateral;  . Portacath placement Right 11/23/2012    Procedure: INSERTION PORT-A-CATH;  Surgeon: Ernestene Mention, MD;  Location:  SURGERY CENTER;  Service: General;  Laterality: Right;    REVIEW OF SYSTEMS:  A 10 point review of systems was conducted and is otherwise negative except for what is noted above.     PHYSICAL EXAMINATION: Blood pressure 135/80, pulse 112, temperature 98.3 F (36.8 C), temperature source Oral, resp. rate 19, height 5\' 3"  (1.6 m), weight 191 lb 3.2 oz (86.728 kg). Body mass index is 33.88 kg/(m^2). General: Patient is a well appearing female in no acute distress HEENT: PERRLA, no scleral injection, crusting on lashes, or periorbital noted.  sclerae anicteric no conjunctival pallor, MMM Neck: supple, no palpable adenopathy Lungs: clear to auscultation bilaterally, no wheezes, rhonchi, or rales Cardiovascular: regular rhythm, S1, S2, no  murmurs, rubs or gallops Abdomen: Soft, non-tender, non-distended, normoactive bowel sounds, no HSM Extremities: warm and well perfused,no edema Skin: No rashes or lesions Neuro: Non-focal Breasts: Bilateral lumpectomy sites without nodularity, no masses or skin changes in either breast. ECOG PERFORMANCE STATUS: 0 - Asymptomatic  LABORATORY DATA: Lab Results  Component Value Date   WBC 25.7* 06/02/2013   HGB 9.6* 06/02/2013   HCT 27.8* 06/02/2013   MCV 102.3* 06/02/2013   PLT 199 06/02/2013      Chemistry      Component Value Date/Time   NA 142 06/02/2013 1017   NA 135 01/08/2013 0555   K 3.4* 06/02/2013 1017   K 4.0 01/08/2013 0555   CL 109* 02/17/2013 1120   CL 100 01/08/2013 0555   CO2 26 06/02/2013 1017   CO2 28 01/08/2013 0555   BUN 10.1 06/02/2013 1017   BUN 10 01/08/2013 0555   CREATININE 0.7 06/02/2013 1017   CREATININE 0.62 01/08/2013 0555      Component Value Date/Time   CALCIUM 9.3 06/02/2013 1017   CALCIUM 9.1 01/08/2013 0555   ALKPHOS 156* 06/02/2013 1017   ALKPHOS 67 01/05/2013 2245   AST 22 06/02/2013 1017   AST 10 01/05/2013 2245   ALT 35 06/02/2013 1017   ALT 15 01/05/2013 2245   BILITOT 0.39 06/02/2013 1017   BILITOT 0.5 01/05/2013 2245    ADDITIONAL INFORMATION: 1. CHROMOGENIC IN-SITU HYBRIDIZATION Results: HER-2/NEU BY CISH - NO AMPLIFICATION OF HER-2 DETECTED. RESULT RATIO OF HER2: CEP 17 SIGNALS 1.00 AVERAGE HER2 COPY NUMBER PER CELL 1.55 REFERENCE RANGE NEGATIVE HER2/Chr17 Ratio <2.0 and Average HER2 copy number <4.0 EQUIVOCAL HER2/Chr17 Ratio <2.0 and Average HER2 copy number 4.0 and <6.0 POSITIVE HER2/Chr17 Ratio >=2.0 and/or Average HER2 copy number >=6.0 Pecola Leisure MD Pathologist, Electronic Signature ( Signed 12/01/2012) 3. CHROMOGENIC IN-SITU HYBRIDIZATION Results: HER-2/NEU BY CISH - NO AMPLIFICATION OF HER-2 DETECTED. RESULT RATIO OF HER2: CEP 17 SIGNALS 1.22 AVERAGE HER2 COPY NUMBER PER CELL 1.40 REFERENCE RANGE NEGATIVE HER2/Chr17 Ratio <2.0  and Average HER2 copy number <4.0 EQUIVOCAL HER2/Chr17 Ratio <2.0 and Average HER2 copy number 4.0 and <6.0 POSITIVE HER2/Chr17 Ratio >=2.0 and/or Average HER2 copy number >=6.0 1 of 5 FINAL for NUR, RABOLD ([id number]) ADDITIONAL INFORMATION:(continued) Pecola Leisure MD  Pathologist, Electronic Signature ( Signed 12/01/2012) FINAL DIAGNOSIS Diagnosis 1. Breast, lumpectomy, Right - INVASIVE DUCTAL CARCINOMA, GRADE III, SEE COMMENT. - INVASIVE CARCINOMA IS 0.9 CM FROM NEAREST MARGIN (INFERIOR). - NO LYMPHOVASCULAR INVASION IDENTIFIED. - HIGH GRADE DUCTAL CARCINOMA IN SITU - IN SITU CARCINOMA IS 1 MM FROM THE NEAREST LATERAL MARGIN - SEE TUMOR SYNOPTIC TEMPLATE BELOW. 2. Lymph nodes, regional resection, Right axilla - ELEVEN LYMPH NODES, NEGATIVE FOR TUMOR (0/11). 3. Breast, lumpectomy, Left - INVASIVE DUCTAL CARCINOMA, GRADE I, SEE COMMENT. - LYMPHOVASCULAR INVASION IDENTIFIED - INVASIVE CARCINOMA IS BROADLY PRESENT AT POSTERIOR MARGIN - DUCTAL CARCINOMA IN SITU, GRADE I. - IN SITU CARCINOMA IS FOCALLY PRESENT AT POSTERIOR MARGIN - SEE TUMOR SYNOPTIC TEMPLATE BELOW. 4. Breast, excision, Left - BENIGN BREAST TISSUE, SEE COMMENT. - NEGATIVE FOR ATYPIA OR MALIGNANCY. - SURGICAL MARGIN, NEGATIVE FOR ATYPIA OR MALIGNANCY. 5. Lymph node, sentinel, biopsy, Left axillary - ONE LYMPH NODE, POSITIVE FOR METASTATIC MAMMARY CARCINOMA (1/1). Microscopic Comment 1. BREAST, INVASIVE TUMOR, WITH LYMPH NODE SAMPLING Specimen, including laterality: Right breast Procedure: Lumpectomy Grade: III of III Tubule formation: 3 Nuclear pleomorphism: 3 Mitotic: 3 Tumor size (gross measurement: 1.5 cm Margins: Invasive, distance to closest margin: 0.9 cm In-situ, distance to closest margin: 0.1 cm If margin positive, focally or broadly: N/A Lymphovascular invasion: Absent Ductal carcinoma in situ: Present Grade: III of III Extensive intraductal component: Absent Lobular neoplasia:  Absent Tumor focality: Multifocal Treatment effect: None 2 of 5 FINAL for ARAINA, BUTRICK ([id number]) Microscopic Comment(continued) If present, treatment effect in breast tissue, lymph nodes or both: N/A Extent of tumor: Skin: N/A Nipple: N/A Skeletal muscle: N/A Lymph nodes: # examined: 11 (Part 2) Lymph nodes with metastasis: 0 Breast prognostic profile: Estrogen receptor: Repeated, previous study demonstrated 0% positivity ([id number]) Progesterone receptor: Repeated, previous study demonstrated 0% positivity ([id number]) Her 2 neu: Repeated, previous study demonstrated no amplification (1.38) ([id number]) Ki-67: Not repeated, previous study demonstrated 92% proliferation rate ([id number]) Non-neoplastic breast: Previous biopsy site, fibrocystic change, sclerosing adenosis and benign calcifications in benign ducts and lobules. TNM: pT1c, pN0, pMX Comments: There are a total of four distinct areas of abnormality identified. The first 1.5 cm corresponds to the invasive ductal carcinoma. The second 0.9 cm area consists of high grade ductal carcinoma in situ with associated fibrocystic change and fibroadenomatoid nodules. In this focus, the in situ carcinoma is 1 mm from the nearest lateral margin. The third 0.4 cm area consists of fat necrosis and fibrosis. There are no atypical or malignant findings in this area. The fourth and final 0.3 cm area demonstrate non-neoplastic findings to include fibrocystic change. There are no atypical or malignant epithelial findings in this area either. 3. BREAST, INVASIVE TUMOR, WITH LYMPH NODE SAMPLING Specimen, including laterality: Left breast Procedure: Lumpectomy Grade: I of III Tubule formation: 3 Nuclear pleomorphism: 3 Mitotic: 1 Tumor size (gross measurement): 1.0 cm Margins: Invasive, distance to closest margin: Present at posterior In-situ, distance to closest margin: Present at posterior If margin positive, focally or  broadly: Broadly (invasive) and focally (in situ) Lymphovascular invasion: Present Ductal carcinoma in situ: Present Grade: I of III Extensive intraductal component: Absent Lobular neoplasia: Absent Tumor focality: Unifocal Treatment effect: None If present, treatment effect in breast tissue, lymph nodes or both: N/A Extent of tumor: Skin: N/A Nipple: N/A Skeletal muscle: N/A Lymph nodes: # examined: 1 (Part 5) Lymph nodes with metastasis: 1 Isolated tumor cells (< 0.2 mm): 0 Micrometastasis: (> 0.2 mm and < 2.0 mm): 1 Macrometastasis: (>  2.0 mm): 0 3 of 5 FINAL for MADALINA, ROSMAN ([id number]) Microscopic Comment(continued) Extracapsular extension: Present Breast prognostic profile: Estrogen receptor: Not repeated, previous study demonstrated 100% positivity ([id number]). Progesterone receptor: Not repeated, previous study demonstrated 25% positivity ([id number]) Her 2 neu: Repeated, previous study demonstrated no amplification (1.42) ([id number]) Ki-67: Not repeated, previous study demonstrated 9% proliferation rate ([id number]) Non-neoplastic breast: Previous biopsy site, fibrocystic change and microcalcifications in benign ducts and lobules. TNM: pT1b, pN73mi, PMX Comments: The additional 1.0 cm and 1.1 cm hemorrhagic areas grossly identified consist of benign breast tissue with microcalcifications and parenchymal hemorrhage. There are no atypical or malignant findings in these areas. 4. The surgical resection margin(s) of the specimen were inked and microscopically evaluated. There is no mass grossly identified. Representative sections demonstrate predominantly benign fibrovascular and adipose tissue with focal areas of fibrocytic change and sclerosing adenosis. There are no atypical or malignant epithelial or stromal findings identified. 5. There are microscopic foci of intranodal metastatic mammary tumor deposits spanning up to 1 mm. There is focal extracapsular  extension identified. (CR:kh 11-25-12) [place] RUND DO Pathologist, Electronic Signature (Case signed 11/25/2012) Specimen Gross and Clinical Information   RADIOGRAPHIC STUDIES:  Chest 2 View  11/19/2012  *RADIOLOGY REPORT*  Clinical Data: Preop for breast cancer  CHEST - 2 VIEW  Comparison: None.  Findings: Cardiomediastinal silhouette is unremarkable.  No acute infiltrate or pleural effusion.  No pulmonary edema.  Bony thorax is unremarkable.  IMPRESSION: No active disease.   Original Report Authenticated By: Natasha Mead, M.D.    Nm Sentinel Node Inj-no Rpt (breast)  11/23/2012  CLINICAL DATA: Bilateral breast cancer   Sulfur colloid was injected intradermally by the nuclear medicine  technologist for breast cancer sentinel node localization.     Dg Chest Portable 1 View  11/23/2012  *RADIOLOGY REPORT*  Clinical Data: 52 year old female status post Port-A-Cath placement.  Breast cancer.  PORTABLE CHEST - 1 VIEW  Comparison: 11/19/2012.  Findings: Portable semi upright AP view 1441 hours.  Right chest subclavian approach Port-A-Cath placed.  Acute angulation of the catheter tubing at the level of the right axilla.  Tip at the level of the cavoatrial junction.  Interval postoperative changes to the right chest wall with subcutaneous appearing drain in place.  No pneumothorax.  No pleural effusion or pulmonary edema.  Mildly increased bibasilar opacity compatible with atelectasis.  Cardiac size and mediastinal contours are within normal limits.  Visualized tracheal air column is within normal limits.  IMPRESSION: 1.  Right subclavian approach chest Port-A-Cath in place.  Tip at the level of the cavoatrial junction.  Mildly angulated catheter tubing at the level of the axilla. 2.  Interval postoperative changes to the right chest wall. No pneumothorax. 3.  Mild dependent atelectasis.   Original Report Authenticated By: Erskine Speed, M.D.    Dg Fluoro Guide Cv Line-no Report  11/23/2012  CLINICAL DATA: bil  breast cancer   FLOURO GUIDE CV LINE  Fluoroscopy was utilized by the requesting physician.  No radiographic  interpretation.     Mm Lt Plc Breast Loc Dev   1st Lesion  Inc Mammo Guide  11/23/2012  *RADIOLOGY REPORT*  Clinical Data:  Left breast cancer  NEEDLE LOCALIZATION WITH MAMMOGRAPHIC GUIDANCE AND SPECIMEN RADIOGRAPH  Comparison:  Previous exams.  Patient presents for needle localization prior to surgery.  I met with the patient and we discussed the procedure of needle localization including benefits and alternatives. We discussed the high likelihood of a successful procedure. We  discussed the risks of the procedure, including infection, bleeding, tissue injury, and further surgery. Informed, written consent was given.  Using mammographic guidance, sterile technique, 2% lidocaine and a #9 modified Kopans needle, the mass and clip in the central aspect of the left breast was localized using a superior approach.  The films are marked for Dr. Derrell Lolling.  Specimen radiograph was performed at [facility], and confirms the mass and clip were present in the tissue sample. The clip was located at the margin of the surgical specimen.  Dr.  Derrell Lolling states she had taken more tissue at the margin to clear the border.  The specimen is marked for pathology.  IMPRESSION: Needle localization of the left breast.  No apparent complications.   Original Report Authenticated By: Baird Lyons, M.D.    Mm Rt Plc Breast Loc Dev   1st Lesion  Inc Mammo Guide  11/23/2012  *RADIOLOGY REPORT*  Clinical Data:  Known right breast cancer  NEEDLE LOCALIZATION WITH MAMMOGRAPHIC GUIDANCE AND SPECIMEN RADIOGRAPH  Comparison:  Previous exams.  Patient presents for needle localization prior to surgery.  I met with the patient and we discussed the procedure of needle localization including benefits and alternatives. We discussed the high likelihood of a successful procedure. We discussed the risks of the procedure, including infection, bleeding,  tissue injury, and further surgery. Informed, written consent was given.  Using mammographic guidance, sterile technique, 2% lidocaine and a #7 and #9 modified Kopans needles, the mass and nodularity in the upper outer quadrant of the right breast was bracketedusing a lateral approach.  The films are marked for Dr. Derrell Lolling.  Specimen radiograph was performed at Day Surgery, and confirms the mass and clip are present in the tissue sample.  The specimen is marked for pathology.  IMPRESSION: Needle localization of the right breast.  No apparent complications.   Original Report Authenticated By: Baird Lyons, M.D.     ASSESSMENT: 52 year old female with  #1 bilateral breast cancers triple negative on the right side ER positive on the left side. She is status post bilateral lumpectomies.she is s/p is  Adriamycin Cytoxan given dose dense x4 cycles followed by 6 cycles ofTaxol carbo.  Taxol/ Carbo was discontinued after 6 cycles due to progressive neuropathy and hand foot syndrome.  Patient was then started on Gemzar Carbo every 2 weeks on 04/28/13 with day 2 neulasta.  She will receive 3 cycles of this.  All completed on 05/13/13.  #2 Neuropathy--patient was on neurontin but developed swelling of the bilateral lower extremities.  Patient will continue super B complex , she has declined neurontin therapy  #3 anemia due to chemotherapy we will continue to monitor her   PLAN:   #1  Patient is doing well today. She has completed the chemotherapy  #2 proceed with radiation therapy in [place] with Dr. Genelle Bal  All questions were answered. The patient knows to call the clinic with any problems, questions or concerns. We can certainly see the patient much sooner if necessary.  I spent 25 minutes counseling the patient face to face. The total time spent in the appointment was 30 minutes.  Drue Second, MD Medical/Oncology [facility] [phone] (beeper) [phone] (Office)  06/02/2013,  4:10 PM

## 2013-06-02 NOTE — Telephone Encounter (Signed)
appts made and printed...td 

## 2013-06-11 ENCOUNTER — Encounter: Payer: Self-pay | Admitting: Oncology

## 2013-06-11 NOTE — Progress Notes (Signed)
Faxed disability form and clinical information to [facility] @ [phone].

## 2013-06-21 LAB — CBC CANCER CENTER
Basophil #: 0 x10 3/mm (ref 0.0–0.1)
Basophil %: 0.5 %
Eosinophil #: 0.1 x10 3/mm (ref 0.0–0.7)
Eosinophil %: 2.9 %
HCT: 32.6 % — ABNORMAL LOW (ref 35.0–47.0)
HGB: 11.2 g/dL — ABNORMAL LOW (ref 12.0–16.0)
Lymphocyte #: 1.1 x10 3/mm (ref 1.0–3.6)
Lymphocyte %: 22.1 %
MCV: 105 fL — ABNORMAL HIGH (ref 80–100)
Monocyte %: 16.7 %
Platelet: 270 x10 3/mm (ref 150–440)
RDW: 16.1 % — ABNORMAL HIGH (ref 11.5–14.5)

## 2013-06-26 ENCOUNTER — Ambulatory Visit: Payer: Self-pay | Admitting: Radiation Oncology

## 2013-06-28 LAB — CBC CANCER CENTER
Basophil #: 0 x10 3/mm (ref 0.0–0.1)
Eosinophil %: 3.1 %
HCT: 36.3 % (ref 35.0–47.0)
Lymphocyte %: 18.5 %
MCHC: 33.4 g/dL (ref 32.0–36.0)
MCV: 105 fL — ABNORMAL HIGH (ref 80–100)
Monocyte #: 0.8 x10 3/mm (ref 0.2–0.9)
Monocyte %: 15.2 %
RBC: 3.46 10*6/uL — ABNORMAL LOW (ref 3.80–5.20)
WBC: 5 x10 3/mm (ref 3.6–11.0)

## 2013-07-05 LAB — CBC CANCER CENTER
Basophil %: 0.3 %
Eosinophil #: 0.2 x10 3/mm (ref 0.0–0.7)
HCT: 35.3 % (ref 35.0–47.0)
Lymphocyte #: 1 x10 3/mm (ref 1.0–3.6)
Lymphocyte %: 23.1 %
MCHC: 33.4 g/dL (ref 32.0–36.0)
MCV: 104 fL — ABNORMAL HIGH (ref 80–100)
Monocyte #: 0.6 x10 3/mm (ref 0.2–0.9)
Neutrophil #: 2.6 x10 3/mm (ref 1.4–6.5)
Platelet: 191 x10 3/mm (ref 150–440)
RBC: 3.38 10*6/uL — ABNORMAL LOW (ref 3.80–5.20)
RDW: 14.7 % — ABNORMAL HIGH (ref 11.5–14.5)

## 2013-07-07 ENCOUNTER — Telehealth (INDEPENDENT_AMBULATORY_CARE_PROVIDER_SITE_OTHER): Payer: Self-pay

## 2013-07-07 NOTE — Telephone Encounter (Signed)
I called the pt.  She wants to have her port removed after she finishes radiation.  She should be done with that the week after Thanksgiving.  She has finished chemo.  She prefers to have the procedure under sedation.  Her insurance will cover 100 %.  I told her once we get the ok from Dr Welton Flakes to remove it, we will get that scheduled.

## 2013-07-07 NOTE — Telephone Encounter (Signed)
Message copied by Ivory Broad on Wed Jul 07, 2013 10:48 AM ------      Message from: Louie Casa      Created: Wed Jul 07, 2013 10:32 AM      Regarding: Dr. Venia Carbon [facility]      Contact: [phone]       Patient called needs to schedule removal of the Port-A-Cath , please could you call her.            Thank you.                   ------

## 2013-07-08 ENCOUNTER — Telehealth (INDEPENDENT_AMBULATORY_CARE_PROVIDER_SITE_OTHER): Payer: Self-pay | Admitting: General Surgery

## 2013-07-08 NOTE — Telephone Encounter (Signed)
Please advise of [facility] policy. Pt has balance with sterns/ dr Derrell Lolling has sent over orders for [facility] removal/

## 2013-07-12 LAB — CBC CANCER CENTER
Eosinophil %: 3.4 %
HCT: 36.9 % (ref 35.0–47.0)
HGB: 12.2 g/dL (ref 12.0–16.0)
Lymphocyte %: 23.2 %
MCH: 34.3 pg — ABNORMAL HIGH (ref 26.0–34.0)
MCHC: 33.1 g/dL (ref 32.0–36.0)
MCV: 104 fL — ABNORMAL HIGH (ref 80–100)
Monocyte #: 0.5 x10 3/mm (ref 0.2–0.9)
Monocyte %: 10.5 %
Neutrophil %: 62.5 %
RDW: 13.8 % (ref 11.5–14.5)
WBC: 4.4 x10 3/mm (ref 3.6–11.0)

## 2013-07-19 LAB — CBC CANCER CENTER
Basophil #: 0 x10 3/mm (ref 0.0–0.1)
Basophil %: 0.1 %
Eosinophil #: 0.1 x10 3/mm (ref 0.0–0.7)
Eosinophil %: 2.6 %
Lymphocyte #: 1.2 x10 3/mm (ref 1.0–3.6)
Lymphocyte %: 25.7 %
MCH: 34.4 pg — ABNORMAL HIGH (ref 26.0–34.0)
MCV: 103 fL — ABNORMAL HIGH (ref 80–100)
Monocyte #: 0.6 x10 3/mm (ref 0.2–0.9)
Monocyte %: 12.3 %
Neutrophil #: 2.7 x10 3/mm (ref 1.4–6.5)
Neutrophil %: 59.3 %
Platelet: 215 x10 3/mm (ref 150–440)
WBC: 4.6 x10 3/mm (ref 3.6–11.0)

## 2013-07-21 ENCOUNTER — Other Ambulatory Visit: Payer: Self-pay | Admitting: Family Medicine

## 2013-07-26 ENCOUNTER — Ambulatory Visit: Payer: Self-pay | Admitting: Radiation Oncology

## 2013-07-26 LAB — CBC CANCER CENTER
Eosinophil %: 1.7 %
HCT: 38 % (ref 35.0–47.0)
Lymphocyte #: 0.9 x10 3/mm — ABNORMAL LOW (ref 1.0–3.6)
Lymphocyte %: 22.4 %
MCH: 34.7 pg — ABNORMAL HIGH (ref 26.0–34.0)
MCHC: 34.1 g/dL (ref 32.0–36.0)
MCV: 102 fL — ABNORMAL HIGH (ref 80–100)
Monocyte #: 0.6 x10 3/mm (ref 0.2–0.9)
Monocyte %: 15.3 %
Neutrophil #: 2.4 x10 3/mm (ref 1.4–6.5)
WBC: 3.9 x10 3/mm (ref 3.6–11.0)

## 2013-08-02 LAB — CBC CANCER CENTER
Basophil #: 0 x10 3/mm (ref 0.0–0.1)
Basophil %: 0.4 %
Eosinophil %: 1.7 %
HCT: 38.7 % (ref 35.0–47.0)
Lymphocyte #: 1.1 x10 3/mm (ref 1.0–3.6)
Lymphocyte %: 24.9 %
MCH: 34.5 pg — ABNORMAL HIGH (ref 26.0–34.0)
MCV: 101 fL — ABNORMAL HIGH (ref 80–100)
Monocyte #: 0.7 x10 3/mm (ref 0.2–0.9)
Platelet: 235 x10 3/mm (ref 150–440)
RBC: 3.85 10*6/uL (ref 3.80–5.20)

## 2013-08-05 ENCOUNTER — Encounter: Payer: Self-pay | Admitting: Gastroenterology

## 2013-08-06 NOTE — Progress Notes (Signed)
Pt to cancel-has burns from radiation

## 2013-08-16 ENCOUNTER — Encounter (HOSPITAL_BASED_OUTPATIENT_CLINIC_OR_DEPARTMENT_OTHER): Payer: Self-pay | Admitting: *Deleted

## 2013-08-24 NOTE — H&P (Signed)
  History: This patient underwent bilateral partial mastectomy with needle localization, right axillary lymph node dissection, left axillary sentinel node biopsy, and Port-A-Cath insertion on 11/23/2012.  On the right side she has invasive ductal carcinoma, grade 3, triple negative breast cancer. In situ component is 1 mm from the lateral margin. Pathologic stage T1c., N0, triple negative breast cancer. Right axilla dissection had to be done because the blue dye and radionuclide did not map into the lymph nodes.  On the left side she had invasive ductal carcinoma, grade 1, receptor positive, HER-2-negative. There was tumor at the posterior margin which was recognized, and the reexcised margin is negative. 1/1 sentinel nodes was positive for microscopic metastatic disease.  She did reasonably well with her chemotherapy. Chemotherapy has been completed.. She has no specific complaints about her breasts. She has full range of motion of her arms. A little bit of numbness of the right arm. No arm swelling. She is referred back for Port-A-Cath removal.   ROS: 10 system review of systems is negative except as described above   Social history, family history reviewed and unchanged.   Exam: Patient looks well. No distress.  Neck no adenopathy or mass. No jugular venous distention  Heart regular rate and rhythm. No murmur or ectopy  Lungs clear to auscultation bilaterally  Breast: Radially oriented scar right breast at 9:00 and right axillar scar well healed. No other skin changes. No mass. No seroma. Very good contour and cosmesis was minimal defect. Left breast reveals lumpectomy and x-ray incision well-healed. Slight thickening of the skin and dermis. Otherwise no mass no axillary adenopathy. Good result.   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 Close but negative negative DCIS margin laterally. Recovering uneventfully following right partial mastectomy  and right axillary lymph node dissection.  Invasive ductal carcinoma left breast, receptor positive, HER-2-negative, resected to negative margins. Pathologic stage T1b, N15mi, . Recovering uneventfully following left partial mastectomy and sentinel lymph biopsy.  Status post Port-A-Cath insertion, no apparent complications.   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  Plan bilateral mammograms in March, 2015  Return to see me in April 2015, sooner if there are any problems.    Angelia Mould. Derrell Lolling, M.D., [facility]  General and Minimally invasive Surgery  Breast and Colorectal Surgery  Office: [phone]  Pager: [phone]

## 2013-08-25 ENCOUNTER — Ambulatory Visit (HOSPITAL_BASED_OUTPATIENT_CLINIC_OR_DEPARTMENT_OTHER): Payer: BC Managed Care – PPO | Admitting: Anesthesiology

## 2013-08-25 ENCOUNTER — Encounter (HOSPITAL_BASED_OUTPATIENT_CLINIC_OR_DEPARTMENT_OTHER): Payer: BC Managed Care – PPO | Admitting: Anesthesiology

## 2013-08-25 ENCOUNTER — Encounter (HOSPITAL_BASED_OUTPATIENT_CLINIC_OR_DEPARTMENT_OTHER)
Admission: RE | Disposition: A | Discharge status: Home / Self Care | Payer: Self-pay | Source: Ambulatory Visit | Attending: General Surgery

## 2013-08-25 ENCOUNTER — Ambulatory Visit (HOSPITAL_BASED_OUTPATIENT_CLINIC_OR_DEPARTMENT_OTHER)
Admission: RE | Admit: 2013-08-25 | Discharge: 2013-08-25 | Disposition: A | Discharge status: Home / Self Care | Payer: BC Managed Care – PPO | Source: Ambulatory Visit | Attending: General Surgery | Admitting: General Surgery

## 2013-08-25 ENCOUNTER — Encounter (HOSPITAL_BASED_OUTPATIENT_CLINIC_OR_DEPARTMENT_OTHER): Payer: Self-pay | Admitting: *Deleted

## 2013-08-25 DIAGNOSIS — Z9010 Acquired absence of unspecified breast and nipple: Secondary | ICD-10-CM | POA: Insufficient documentation

## 2013-08-25 DIAGNOSIS — Z452 Encounter for adjustment and management of vascular access device: Secondary | ICD-10-CM | POA: Insufficient documentation

## 2013-08-25 DIAGNOSIS — Z853 Personal history of malignant neoplasm of breast: Secondary | ICD-10-CM | POA: Insufficient documentation

## 2013-08-25 DIAGNOSIS — Z9221 Personal history of antineoplastic chemotherapy: Secondary | ICD-10-CM | POA: Insufficient documentation

## 2013-08-25 HISTORY — PX: PORT-A-CATH REMOVAL: SHX5289

## 2013-08-25 LAB — POCT I-STAT, CHEM 8
BUN: 14 mg/dL (ref 6–23)
Creatinine, Ser: 0.7 mg/dL (ref 0.50–1.10)
Glucose, Bld: 114 mg/dL — ABNORMAL HIGH (ref 70–99)
Hemoglobin: 13.9 g/dL (ref 12.0–15.0)
Potassium: 3.6 mEq/L — ABNORMAL LOW (ref 3.7–5.3)
TCO2: 23 mmol/L (ref 0–100)

## 2013-08-25 SURGERY — REMOVAL PORT-A-CATH
Anesthesia: Monitor Anesthesia Care | Site: Chest | Laterality: Right

## 2013-08-25 MED ORDER — OXYCODONE HCL 5 MG PO TABS: 5.0000 mg | ORAL_TABLET | ORAL | Status: DC | PRN

## 2013-08-25 MED ORDER — MIDAZOLAM HCL 2 MG/2ML IJ SOLN
INTRAMUSCULAR | Status: AC
  Filled 2013-08-25: qty 2

## 2013-08-25 MED ORDER — SODIUM CHLORIDE 0.9 % IJ SOLN: 3.0000 mL | INTRAMUSCULAR | Status: DC | PRN

## 2013-08-25 MED ORDER — ONDANSETRON HCL 4 MG/2ML IJ SOLN
INTRAMUSCULAR | Status: DC | PRN
  Administered 2013-08-25: 4 mg via INTRAVENOUS

## 2013-08-25 MED ORDER — LIDOCAINE HCL (CARDIAC) 20 MG/ML IV SOLN
INTRAVENOUS | Status: DC | PRN
  Administered 2013-08-25: 100 mg via INTRAVENOUS

## 2013-08-25 MED ORDER — ACETAMINOPHEN 650 MG RE SUPP: 650.0000 mg | RECTAL | Status: DC | PRN

## 2013-08-25 MED ORDER — HYDROCODONE-ACETAMINOPHEN 5-325 MG PO TABS: 1.0000 | ORAL_TABLET | ORAL | Status: DC | PRN

## 2013-08-25 MED ORDER — LIDOCAINE-EPINEPHRINE (PF) 1 %-1:200000 IJ SOLN
INTRAMUSCULAR | Status: AC
  Filled 2013-08-25: qty 10

## 2013-08-25 MED ORDER — HYDROMORPHONE HCL PF 1 MG/ML IJ SOLN: 0.2500 mg | INTRAMUSCULAR | Status: DC | PRN

## 2013-08-25 MED ORDER — LIDOCAINE-EPINEPHRINE (PF) 1 %-1:200000 IJ SOLN
INTRAMUSCULAR | Status: DC | PRN
  Administered 2013-08-25: 30 mL

## 2013-08-25 MED ORDER — SODIUM CHLORIDE 0.9 % IV SOLN: 250.0000 mL | INTRAVENOUS | Status: DC | PRN

## 2013-08-25 MED ORDER — PROPOFOL 10 MG/ML IV BOLUS
INTRAVENOUS | Status: DC | PRN
  Administered 2013-08-25 (×4): 20 mg via INTRAVENOUS

## 2013-08-25 MED ORDER — MIDAZOLAM HCL 5 MG/5ML IJ SOLN
INTRAMUSCULAR | Status: DC | PRN
  Administered 2013-08-25: 2 mg via INTRAVENOUS

## 2013-08-25 MED ORDER — FENTANYL CITRATE 0.05 MG/ML IJ SOLN
INTRAMUSCULAR | Status: AC
  Filled 2013-08-25: qty 2

## 2013-08-25 MED ORDER — FENTANYL CITRATE 0.05 MG/ML IJ SOLN: 25.0000 ug | INTRAMUSCULAR | Status: DC | PRN

## 2013-08-25 MED ORDER — ONDANSETRON HCL 4 MG/2ML IJ SOLN: 4.0000 mg | Freq: Once | INTRAMUSCULAR | Status: DC | PRN

## 2013-08-25 MED ORDER — LIDOCAINE HCL (PF) 1 % IJ SOLN
INTRAMUSCULAR | Status: AC
  Filled 2013-08-25: qty 30

## 2013-08-25 MED ORDER — OXYCODONE HCL 5 MG/5ML PO SOLN: 5.0000 mg | Freq: Once | ORAL | Status: DC | PRN

## 2013-08-25 MED ORDER — LACTATED RINGERS IV SOLN
INTRAVENOUS | Status: DC
  Administered 2013-08-25: 12:00:00 via INTRAVENOUS

## 2013-08-25 MED ORDER — SODIUM BICARBONATE 4 % IV SOLN
INTRAVENOUS | Status: AC
  Filled 2013-08-25: qty 5

## 2013-08-25 MED ORDER — SODIUM CHLORIDE 0.9 % IV SOLN: INTRAVENOUS | Status: DC

## 2013-08-25 MED ORDER — ONDANSETRON HCL 4 MG/2ML IJ SOLN: 4.0000 mg | Freq: Four times a day (QID) | INTRAMUSCULAR | Status: DC | PRN

## 2013-08-25 MED ORDER — SODIUM CHLORIDE 0.9 % IJ SOLN: 3.0000 mL | Freq: Two times a day (BID) | INTRAMUSCULAR | Status: DC

## 2013-08-25 MED ORDER — OXYCODONE HCL 5 MG PO TABS: 5.0000 mg | ORAL_TABLET | Freq: Once | ORAL | Status: DC | PRN

## 2013-08-25 MED ORDER — MIDAZOLAM HCL 2 MG/2ML IJ SOLN: 1.0000 mg | INTRAMUSCULAR | Status: DC | PRN

## 2013-08-25 MED ORDER — FENTANYL CITRATE 0.05 MG/ML IJ SOLN: 50.0000 ug | INTRAMUSCULAR | Status: DC | PRN

## 2013-08-25 MED ORDER — ACETAMINOPHEN 325 MG PO TABS: 650.0000 mg | ORAL_TABLET | ORAL | Status: DC | PRN

## 2013-08-25 MED ORDER — SODIUM BICARBONATE 4 % IV SOLN
INTRAVENOUS | Status: DC | PRN
  Administered 2013-08-25: 5 mL via INTRAVENOUS

## 2013-08-25 MED ORDER — FENTANYL CITRATE 0.05 MG/ML IJ SOLN
INTRAMUSCULAR | Status: DC | PRN
  Administered 2013-08-25: 100 ug via INTRAVENOUS

## 2013-08-25 SURGICAL SUPPLY — 36 items
ADH SKN CLS APL DERMABOND .7 (GAUZE/BANDAGES/DRESSINGS) ×1
APL SKNCLS STERI-STRIP NONHPOA (GAUZE/BANDAGES/DRESSINGS)
BENZOIN TINCTURE PRP APPL 2/3 (GAUZE/BANDAGES/DRESSINGS) IMPLANT
BLADE HEX COATED 2.75 (ELECTRODE) ×2 IMPLANT
BLADE SURG 15 STRL LF DISP TIS (BLADE) ×2 IMPLANT
BLADE SURG 15 STRL SS (BLADE) ×2
CANISTER SUCT 1200ML W/VALVE (MISCELLANEOUS) IMPLANT
CHLORAPREP W/TINT 26ML (MISCELLANEOUS) IMPLANT
COVER MAYO STAND STRL (DRAPES) ×2 IMPLANT
COVER TABLE BACK 60X90 (DRAPES) ×2 IMPLANT
DECANTER SPIKE VIAL GLASS SM (MISCELLANEOUS) ×2 IMPLANT
DERMABOND ADVANCED (GAUZE/BANDAGES/DRESSINGS) ×1
DERMABOND ADVANCED .7 DNX12 (GAUZE/BANDAGES/DRESSINGS) IMPLANT
DRAPE PED LAPAROTOMY (DRAPES) ×2 IMPLANT
DRAPE UTILITY XL STRL (DRAPES) ×2 IMPLANT
DRSG TEGADERM 4X4.75 (GAUZE/BANDAGES/DRESSINGS) IMPLANT
DURAPREP 26ML APPLICATOR (WOUND CARE) ×2 IMPLANT
ELECT REM PT RETURN 9FT ADLT (ELECTROSURGICAL) ×2
ELECTRODE REM PT RTRN 9FT ADLT (ELECTROSURGICAL) ×1 IMPLANT
GLOVE EUDERMIC 7 POWDERFREE (GLOVE) ×2 IMPLANT
GLOVE SURG SS PI 7.0 STRL IVOR (GLOVE) ×2 IMPLANT
GOWN PREVENTION PLUS XLARGE (GOWN DISPOSABLE) ×2 IMPLANT
GOWN PREVENTION PLUS XXLARGE (GOWN DISPOSABLE) ×2 IMPLANT
NEEDLE HYPO 25X1 1.5 SAFETY (NEEDLE) ×2 IMPLANT
PACK BASIN DAY SURGERY FS (CUSTOM PROCEDURE TRAY) ×2 IMPLANT
PENCIL BUTTON HOLSTER BLD 10FT (ELECTRODE) ×2 IMPLANT
SLEEVE SCD COMPRESS KNEE MED (MISCELLANEOUS) ×2 IMPLANT
SPONGE GAUZE 4X4 12PLY STER LF (GAUZE/BANDAGES/DRESSINGS) IMPLANT
STRIP CLOSURE SKIN 1/2X4 (GAUZE/BANDAGES/DRESSINGS) IMPLANT
SUT MNCRL AB 4-0 PS2 18 (SUTURE) ×2 IMPLANT
SUT VICRYL 3-0 CR8 SH (SUTURE) ×2 IMPLANT
SYR CONTROL 10ML LL (SYRINGE) ×2 IMPLANT
TOWEL OR 17X24 6PK STRL BLUE (TOWEL DISPOSABLE) ×4 IMPLANT
TOWEL OR NON WOVEN STRL DISP B (DISPOSABLE) ×2 IMPLANT
TUBE CONNECTING 20X1/4 (TUBING) IMPLANT
YANKAUER SUCT BULB TIP NO VENT (SUCTIONS) IMPLANT

## 2013-08-25 NOTE — Interval H&P Note (Signed)
History and Physical Interval Note:  08/25/2013 12:36 PM  Amy Leonard  has presented today for surgery, with the diagnosis of breast cancer   The goals and the various methods of treatment have been discussed with the patient and family. After consideration of risks, benefits and other options for treatment, the patient has consented to  Procedure(s): REMOVAL PORT-A-CATH (N/A) as a surgical intervention .  The patient's history has been reviewed, patient examined, no change in status, stable for surgery.  I have reviewed the patient's chart and labs.  Questions were answered to the patient's satisfaction.     Ernestene Mention

## 2013-08-25 NOTE — Anesthesia Procedure Notes (Signed)
Procedure Name: MAC Date/Time: 08/25/2013 1:28 PM Performed by: Zenia Resides D Pre-anesthesia Checklist: Patient identified, Timeout performed, Emergency Drugs available, Suction available and Patient being monitored Patient Re-evaluated:Patient Re-evaluated prior to inductionOxygen Delivery Method: Simple face mask Preoxygenation: Pre-oxygenation with 100% oxygen

## 2013-08-25 NOTE — Op Note (Signed)
Patient Name:           Amy Leonard   Date of Surgery:        08/25/2013  Pre op Diagnosis:      Bilateral breast cancer Status post left partial mastectomy and sentinel lymph node biopsy. Status post adjuvant chemotherapy  Post op Diagnosis:    Same  Procedure:                 Removal of Port-A-Cath  Surgeon:                     Angelia Mould. Derrell Lolling, M.D., FACS  Assistant:                      None  Operative Indications:   This patient underwent bilateral partial mastectomy with needle localization, right axillary lymph node dissection, left axillary sentinel node biopsy, and Port-A-Cath insertion on 11/23/2012.  On the right side she has invasive ductal carcinoma, grade 3, triple negative breast cancer. In situ component is 1 mm from the lateral margin. Pathologic stage T1c., N0, triple negative breast cancer. Right axilla dissection had to be done because the blue dye and radionuclide did not map into the lymph nodes.  On the left side she had invasive ductal carcinoma, grade 1, receptor positive, HER-2-negative. There was tumor at the posterior margin which was recognized, and the reexcised margin is negative. 1/1 sentinel nodes was positive for microscopic metastatic disease.  She did reasonably well with her chemotherapy. Chemotherapy has been completed.. She has no specific complaints about her breasts. She has full range of motion of her arms. A little bit of numbness of the right arm. No arm swelling.  She is referred back for Port-A-Cath removal.   Operative Findings:       The port and the catheter were removed intact. There was no evidence of infection or bleeding.  Procedure in Detail:          The patient was brought to room 1 at [facility]. She was monitored and sedated by the anesthesia Department. The right upper chest and neck were prepped and draped in a sterile fashion. Surgical time out was performed. 1% Xylocaine with epinephrine was used as a local infiltration  anesthetic. A transverse incision was made at the right infraclavicular area, through the previous scar. Dissection was carried down through the subcutaneous tissue. The capsule around the port was incised. The port was elevated and all 3 Prolene sutures were cut and removed. The port and the catheter were removed without any resistance. There was no bleeding. The subcutaneous tissue was closed with 3-0 Vicryl sutures and the skin closed with a running subcuticular suture of 4-0 Monocryl and Dermabond. The patient tolerated the procedure well. She was taken to PACU in stable condition. Counts correct. Complications none.     Angelia Mould. Derrell Lolling, M.D., FACS General and Minimally Invasive Surgery Breast and Colorectal Surgery  08/25/2013 1:52 PM

## 2013-08-25 NOTE — Anesthesia Postprocedure Evaluation (Signed)
  Anesthesia Post-op Note  Patient: Amy Leonard  Procedure(s) Performed: Procedure(s): REMOVAL PORT-A-CATH (Right)  Patient Location: PACU  Anesthesia Type:MAC  Level of Consciousness: awake, alert  and oriented  Airway and Oxygen Therapy: Patient Spontanous Breathing and Patient connected to face mask oxygen  Post-op Pain: mild  Post-op Assessment: Post-op Vital signs reviewed  Post-op Vital Signs: Reviewed  Complications: No apparent anesthesia complications

## 2013-08-25 NOTE — Transfer of Care (Signed)
Immediate Anesthesia Transfer of Care Note  Patient: Amy Leonard  Procedure(s) Performed: Procedure(s): REMOVAL PORT-A-CATH (Right)  Patient Location: PACU  Anesthesia Type:MAC  Level of Consciousness: awake, alert  and oriented  Airway & Oxygen Therapy: Patient Spontanous Breathing and Patient connected to face mask oxygen  Post-op Assessment: Report given to PACU RN and Post -op Vital signs reviewed and stable  Post vital signs: Reviewed and stable  Complications: No apparent anesthesia complications

## 2013-08-25 NOTE — Anesthesia Preprocedure Evaluation (Signed)
Anesthesia Evaluation  Patient identified by MRN, date of birth, ID band Patient awake    Reviewed: Allergy & Precautions, H&P , NPO status , Patient's Chart, lab work & pertinent test results  Airway Mallampati: I TM Distance: >3 FB Neck ROM: Full    Dental  (+) Teeth Intact, Partial Upper and Dental Advisory Given   Pulmonary Current Smoker,          Cardiovascular hypertension, Pt. on medications Rhythm:Regular     Neuro/Psych    GI/Hepatic   Endo/Other    Renal/GU      Musculoskeletal   Abdominal   Peds  Hematology   Anesthesia Other Findings   Reproductive/Obstetrics                           Anesthesia Physical Anesthesia Plan  ASA: III  Anesthesia Plan: MAC   Post-op Pain Management:    Induction: Intravenous  Airway Management Planned: Simple Face Mask  Additional Equipment:   Intra-op Plan:   Post-operative Plan:   Informed Consent: I have reviewed the patients History and Physical, chart, labs and discussed the procedure including the risks, benefits and alternatives for the proposed anesthesia with the patient or authorized representative who has indicated his/her understanding and acceptance.   Dental advisory given  Plan Discussed with: CRNA, Anesthesiologist and Surgeon  Anesthesia Plan Comments:         Anesthesia Quick Evaluation

## 2013-08-26 ENCOUNTER — Ambulatory Visit: Payer: Self-pay | Admitting: Radiation Oncology

## 2013-08-27 ENCOUNTER — Encounter (HOSPITAL_BASED_OUTPATIENT_CLINIC_OR_DEPARTMENT_OTHER): Payer: Self-pay | Admitting: General Surgery

## 2013-09-02 ENCOUNTER — Other Ambulatory Visit (HOSPITAL_BASED_OUTPATIENT_CLINIC_OR_DEPARTMENT_OTHER): Payer: BC Managed Care – PPO

## 2013-09-02 ENCOUNTER — Ambulatory Visit (HOSPITAL_BASED_OUTPATIENT_CLINIC_OR_DEPARTMENT_OTHER): Payer: BC Managed Care – PPO | Admitting: Physician Assistant

## 2013-09-02 ENCOUNTER — Telehealth: Payer: Self-pay | Admitting: Oncology

## 2013-09-02 ENCOUNTER — Encounter: Payer: Self-pay | Admitting: Physician Assistant

## 2013-09-02 VITALS — BP 133/82 | HR 85 | Temp 98.2°F | Resp 18 | Ht 63.0 in | Wt 184.8 lb

## 2013-09-02 DIAGNOSIS — C50411 Malignant neoplasm of upper-outer quadrant of right female breast: Secondary | ICD-10-CM

## 2013-09-02 DIAGNOSIS — G620 Drug-induced polyneuropathy: Secondary | ICD-10-CM

## 2013-09-02 DIAGNOSIS — C50919 Malignant neoplasm of unspecified site of unspecified female breast: Secondary | ICD-10-CM

## 2013-09-02 DIAGNOSIS — C50419 Malignant neoplasm of upper-outer quadrant of unspecified female breast: Secondary | ICD-10-CM

## 2013-09-02 DIAGNOSIS — Z780 Asymptomatic menopausal state: Secondary | ICD-10-CM

## 2013-09-02 DIAGNOSIS — Z170 Estrogen receptor positive status [ER+]: Secondary | ICD-10-CM

## 2013-09-02 DIAGNOSIS — C50112 Malignant neoplasm of central portion of left female breast: Secondary | ICD-10-CM

## 2013-09-02 DIAGNOSIS — Z853 Personal history of malignant neoplasm of breast: Secondary | ICD-10-CM

## 2013-09-02 LAB — CBC WITH DIFFERENTIAL/PLATELET
BASO%: 0.4 % (ref 0.0–2.0)
Basophils Absolute: 0 10*3/uL (ref 0.0–0.1)
EOS%: 3.4 % (ref 0.0–7.0)
Eosinophils Absolute: 0.2 10*3/uL (ref 0.0–0.5)
HCT: 40.5 % (ref 34.8–46.6)
HGB: 13.7 g/dL (ref 11.6–15.9)
LYMPH%: 24.4 % (ref 14.0–49.7)
MCH: 33.5 pg (ref 25.1–34.0)
MCHC: 33.9 g/dL (ref 31.5–36.0)
MCV: 98.9 fL (ref 79.5–101.0)
MONO#: 0.7 10*3/uL (ref 0.1–0.9)
MONO%: 9.3 % (ref 0.0–14.0)
NEUT#: 4.6 10*3/uL (ref 1.5–6.5)
NEUT%: 62.5 % (ref 38.4–76.8)
Platelets: 232 10*3/uL (ref 145–400)
RBC: 4.1 10*6/uL (ref 3.70–5.45)
RDW: 13.3 % (ref 11.2–14.5)
WBC: 7.3 10*3/uL (ref 3.9–10.3)
lymph#: 1.8 10*3/uL (ref 0.9–3.3)

## 2013-09-02 LAB — COMPREHENSIVE METABOLIC PANEL (CC13)
ALT: 21 U/L (ref 0–55)
AST: 16 U/L (ref 5–34)
Albumin: 3.8 g/dL (ref 3.5–5.0)
Alkaline Phosphatase: 90 U/L (ref 40–150)
Anion Gap: 11 meq/L (ref 3–11)
BUN: 14.4 mg/dL (ref 7.0–26.0)
CO2: 25 meq/L (ref 22–29)
Calcium: 9.5 mg/dL (ref 8.4–10.4)
Chloride: 105 meq/L (ref 98–109)
Creatinine: 0.6 mg/dL (ref 0.6–1.1)
Glucose: 96 mg/dL (ref 70–140)
Potassium: 4 meq/L (ref 3.5–5.1)
Sodium: 141 meq/L (ref 136–145)
Total Bilirubin: 0.32 mg/dL (ref 0.20–1.20)
Total Protein: 6.6 g/dL (ref 6.4–8.3)

## 2013-09-02 MED ORDER — ANASTROZOLE 1 MG PO TABS: 1.0000 mg | ORAL_TABLET | Freq: Every day | ORAL | Status: DC

## 2013-09-02 NOTE — Telephone Encounter (Signed)
, °

## 2013-09-02 NOTE — Progress Notes (Signed)
OFFICE PROGRESS NOTE  CC  [name], MD [facility] [address] [place] [address] Dr. Fanny Skates  Dr. Gery Pray  DIAGNOSIS: 53 year old female with new diagnosis of bilateral breast cancers.   STAGE:  Cancer of upper-outer quadrant of female breast  Primary site: Breast (Bilateral)  Staging method: AJCC 7th Edition  Clinical: Stage IA (T1c, N0, cM0)  Summary: Stage IA (T1c, N0, cM0)   PRIOR THERAPY: #1Patient recently underwent screening mammograms and she was found to have bilateral breast abnormalities.diagnostic mammogram showed in the right breast an ill-defined 2 cm mass with a few associated well defined microcalcifications over the upper outer quadrant. Spot compression images of the left breast demonstrated an ill defined spiculated 8 mm mass centrally. Ultrasound showed irregular bordered heterogeneous hypoechoic solid mass at the 10:00 position of the right breast 6 cm from the nipple measuring 1.1 x 1.3 x 1.4 cm. Also in this location was an adjacent smaller irregular hypoechoic mass measuring 3 x 5 x 6 mm. The smaller mass was 1 cm from the large more irregular mass. Ultrasound of the axilla demonstrated single lymph node with thickened cortex. Ultrasound of the left breast demonstrated an ill-defined hypoechoic mass with distal acoustic shadowing 7 to 8:00 position 3 cm from the nipple located deep and measuring 5 x 8 x 8 mm ultrasound of the left axilla showed normal appearing lymph nodes.   #2Patient went on to have needle core biopsies performed of both of the masses. 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 The tumor was ER positive PR positive with a Ki-67 of 9% the second tumor was ER negative PR negative HER-2/neu negative with Ki-6792% and elevated.  #3 Patient had MRIs of the breasts performed. There was  noted to be 1.7 cm irregular enhancing mass located within the upper-outer quadrant of the right breast with 3 adjacent worrisome enhancing satellite nodules in aggregate the nodules and mass measures 3.8 cm. In the left breast 8 mm irregular enhancing mass located within the central left breast was noted. No evidence of axillary or internal mammary adenopathy.  #4 Patient is status post bilateral lumpectomies. The right breast revealed a 1.5 cm grade 3 invasive ductal carcinoma ER/PR negative HER-2/neu negative Ki-67 92%. Sentinel node was negative. Left breast showed 1.0 cm grade 3 invasive ductal carcinoma ER/PR positive HER-2/neu negative sentinel node was negative.  #5 s/p adjuvant chemotherapy consisting of Adriamycin Cytoxan every 2 weeks for a total of 4 cycles 12/30/12 - 02/17/13  She received 6 cycles of Taxol carbo from 03/10/13 - 04/14/13 and it was discontinued due to progressive neuropathy and hand foot syndrome.  She was then started on Gemzar Carbo 04/28/13 and received 3 cycles of this.  Patient will need adjuvant radiation therapy.   #6  patient is status post radiation therapy at  [facility] with Dr. Berton Mount, completed early December 2014   #7  will be started on anti-estrogen therapy due to be ER/PR positive status of the left breast cancer.    CURRENT THERAPY: Beginning anastrozole  INTERVAL HISTORY: Diani Jillson Veasey 53 y.o. female returns today for followup of her bilateral breast carcinomas. She completed her chemotherapy in October, and then received radiation therapy at [facility]. Radiation was completed in early December. She is now ready to discuss antiestrogen therapy.   Physically, Shanesha is recovering well. 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 It does require that she sit down more frequently to rest her feet.  She had some skin changes associated with radiation, and these are healing nicely. She's had no additional  complications. She also had her port removed on 24/82/5003 with no complications.  Aleda denies any recent illnesses and has had no fevers or chills. She does have occasional hot flashes which are mild. She is postmenopausal, her last menstrual period being in 2012. She's had no vaginal bleeding whatsoever, but does have some mild vaginal dryness. She has some joint pain affecting her right knee, and has chronic lower back pain. She tells me she recently had "bronchitis" which was treated by her PCP, but this has improved and she has only a slight residual cough, sometimes productive of clear phlegm. She's had no pleurisy, no shortness of breath, and no orthopnea.     MEDICAL HISTORY: Past Medical History  Diagnosis Date  . Elevated WBC count     nl diff  . Chronic back pain     spinal stenosis  . Tobacco abuse   . Anxiety and depression   . HSV infection     recurrent (side and buttox)  . Migraine   . Glaucoma     ?  . Carpal tunnel syndrome   . HLD (hyperlipidemia)   . Folliculitis 7/0/4888  . Anxiety   . Depression   . 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 GERD (gastroesophageal reflux disease)   . Hypertension   . Breast cancer   . Wears dentures     top    ALLERGIES:  is allergic to bupropion hcl; chlorhexidine gluconate; hydrocod polst-cpm polst er; lisinopril; and naproxen sodium.  MEDICATIONS:  Current Outpatient Prescriptions  Medication Sig Dispense Refill  . acyclovir ointment (ZOVIRAX) 5 % Apply 1 application topically as needed (flare-up).  15 g  1  . aspirin 81 MG chewable tablet Chew 81 mg by mouth daily.      Marland Kitchen atorvastatin (LIPITOR) 10 MG tablet Take 1 tablet (10 mg total) by mouth daily. *needs appointment with doctor before future refills are given*  90 tablet  0  . losartan (COZAAR) 50 MG tablet Take 1 tablet (50 mg total) by mouth daily.  90 tablet  2  . omeprazole (PRILOSEC) 40 MG capsule Take 1 capsule (40 mg total) by mouth 2 (two) times daily.  180  capsule  3  . simethicone (MYLICON) 80 MG chewable tablet Chew 1 tablet (80 mg total) by mouth every 8 (eight) hours as needed for flatulence.  60 tablet  3  . Acetaminophen (TYLENOL ARTHRITIS PAIN PO) Take 2 tablets by mouth as needed (pain).       Marland Kitchen anastrozole (ARIMIDEX) 1 MG tablet Take 1 tablet (1 mg total) by mouth daily.  90 tablet  3  . calcium carbonate (TUMS - DOSED IN MG ELEMENTAL CALCIUM) 500 MG chewable tablet Chew by mouth as directed.        Marland Kitchen HYDROcodone-acetaminophen (NORCO/VICODIN) 5-325 MG per tablet Take 1-2 tablets by mouth every 4 (four) hours as needed.  20 tablet  0  . nicotine (NICODERM CQ - DOSED IN MG/24 HOURS) 21 mg/24hr patch Place 1 patch onto the skin daily.  28 patch  0  . ranitidine (ZANTAC) 300 MG tablet Take 150 mg by mouth at bedtime.       Marland Kitchen UNABLE TO FIND Rx: L8000-Mastectomy Bra (Quantity: 2) Dx:  174.9 Bilateral Lumpectomy  1 each  0   No current facility-administered medications for this visit.    SURGICAL HISTORY:  Past Surgical History  Procedure Laterality Date  . Btl    . Epidural steroid injection  2008  . Colonoscopy    . Upper gi endoscopy    . Partial mastectomy with needle localization and axillary sentinel lymph node bx Bilateral 11/23/2012    Procedure: PARTIAL MASTECTOMY WITH NEEDLE LOCALIZATION AND AXILLARY SENTINEL LYMPH NODE BX;  Surgeon: Adin Hector, MD;  Location: [facility];  Service: General;  Laterality: Bilateral;  . Portacath placement Right 11/23/2012    Procedure: INSERTION PORT-A-CATH;  Surgeon: Adin Hector, MD;  Location: [facility];  Service: General;  Laterality: Right;  . Port-a-cath removal Right 08/25/2013    Procedure: REMOVAL PORT-A-CATH;  Surgeon: Adin Hector, MD;  Location: [facility];  Service: General;  Laterality: Right;    REVIEW OF SYSTEMS:  A 10 point review of systems was conducted and is otherwise negative except for what is noted above.      PHYSICAL EXAMINATION: Blood pressure 133/82, pulse 85, temperature 98.2 F (36.8 C), temperature source Oral, resp. rate 18, height _0  (1.6 m), weight 184 lb 12.8 oz (83.825 kg). Body mass index is 32.74 kg/(m^2).   ECOG: 0 General: Patient is a well appearing female in no acute distress Filed Weights   09/02/13 1109  Weight: 184 lb 12.8 oz (83.825 kg)  Physical Exam: HEENT:  Sclerae anicteric. Conjunctiva pink. Oropharynx clear and moist. Neck is supple. Trachea midline.  NODES:  No cervical, supraclavicular, or axillary lymphadenopathy palpated on either the right or the left  BREAST EXAM: status post bilateral lumpectomies and bilateral radiation. Incisions are well-healed. There is some mild residual erythema bilaterally status post radiation therapy, but no moist desquamation, and no additional skin changes. No evidence of local recurrence. Port has been removed from the right upper chest wall with well-healed incision. LUNGS:  Clear to auscultation bilaterally.  No wheezes or rhonchi HEART:  Regular rate and rhythm. No murmur appreciated ABDOMEN:  Soft, nondistended, nontender.  Positive bowel sounds. No organomegaly or masses palpated. MSK:  No focal spinal tenderness to palpation. Good range of motion bilaterally in the upper extremities. EXTREMITIES:  No peripheral edema.   SKIN:  Benign with no obvious skin rashes or lesions. No nail dyscrasia. NEURO:  Nonfocal. Well oriented.  Appropriate affect.   LABORATORY DATA: Lab Results  Component Value Date   WBC 7.3 09/02/2013   HGB 13.7 09/02/2013   HCT 40.5 09/02/2013   MCV 98.9 09/02/2013   PLT 232 09/02/2013      Chemistry      Component Value Date/Time   NA 141 09/02/2013 1041   NA 143 08/25/2013 1205   K 4.0 09/02/2013 1041   K 3.6* 08/25/2013 1205   CL 105 08/25/2013 1205   CL 109* 02/17/2013 1120   CO2 25 09/02/2013 1041   CO2 28 01/08/2013 0555   BUN 14.4 09/02/2013 1041   BUN 14 08/25/2013 1205   CREATININE 0.6 09/02/2013  1041   CREATININE 0.70 08/25/2013 1205      Component Value Date/Time   CALCIUM 9.5 09/02/2013 1041   CALCIUM 9.1 01/08/2013 0555   ALKPHOS 90 09/02/2013 1041   ALKPHOS 67 01/05/2013 2245   AST 16 09/02/2013 1041   AST 10 01/05/2013 2245   ALT 21 09/02/2013 1041   ALT 15 01/05/2013 2245  BILITOT 0.32 09/02/2013 1041   BILITOT 0.5 01/05/2013 2245    ADDITIONAL INFORMATION: 1. CHROMOGENIC IN-SITU HYBRIDIZATION Results: HER-2/NEU BY CISH - NO AMPLIFICATION OF HER-2 DETECTED. RESULT RATIO OF HER2: CEP 17 SIGNALS 1.00 AVERAGE HER2 COPY NUMBER PER CELL 1.55 REFERENCE RANGE NEGATIVE HER2/Chr17 Ratio <2.0 and Average HER2 copy number <4.0 EQUIVOCAL HER2/Chr17 Ratio <2.0 and Average HER2 copy number 4.0 and <6.0 POSITIVE HER2/Chr17 Ratio >=2.0 and/or Average HER2 copy number >=6.0 Enid Cutter MD Pathologist, Electronic Signature ( Signed 12/01/2012) 3. CHROMOGENIC IN-SITU HYBRIDIZATION Results: HER-2/NEU BY CISH - NO AMPLIFICATION OF HER-2 DETECTED. RESULT RATIO OF HER2: CEP 17 SIGNALS 1.22 AVERAGE HER2 COPY NUMBER PER CELL 1.40 REFERENCE RANGE NEGATIVE HER2/Chr17 Ratio <2.0 and Average HER2 copy number <4.0 EQUIVOCAL HER2/Chr17 Ratio <2.0 and Average HER2 copy number 4.0 and <6.0 POSITIVE HER2/Chr17 Ratio >=2.0 and/or Average HER2 copy number >=6.0 1 of 5 FINAL for RAEONNA, MILO ([id number]) ADDITIONAL INFORMATION:(continued) Enid Cutter MD Pathologist, Electronic Signature ( Signed 12/01/2012) FINAL DIAGNOSIS Diagnosis 1. Breast, lumpectomy, Right - INVASIVE DUCTAL CARCINOMA, GRADE III, SEE COMMENT. - INVASIVE CARCINOMA IS 0.9 CM FROM NEAREST MARGIN (INFERIOR). - NO LYMPHOVASCULAR INVASION IDENTIFIED. - HIGH GRADE DUCTAL CARCINOMA IN SITU - IN SITU CARCINOMA IS 1 MM FROM THE NEAREST LATERAL MARGIN - SEE TUMOR SYNOPTIC TEMPLATE BELOW. 2. Lymph nodes, regional resection, Right axilla - ELEVEN LYMPH NODES, NEGATIVE FOR TUMOR (0/11). 3. Breast, lumpectomy, Left - INVASIVE DUCTAL  CARCINOMA, GRADE I, SEE COMMENT. - LYMPHOVASCULAR INVASION IDENTIFIED - INVASIVE CARCINOMA IS BROADLY PRESENT AT POSTERIOR MARGIN - DUCTAL CARCINOMA IN SITU, GRADE I. - IN SITU CARCINOMA IS FOCALLY PRESENT AT POSTERIOR MARGIN - SEE TUMOR SYNOPTIC TEMPLATE BELOW. 4. Breast, excision, Left - BENIGN BREAST TISSUE, SEE COMMENT. - NEGATIVE FOR ATYPIA OR MALIGNANCY. - SURGICAL MARGIN, NEGATIVE FOR ATYPIA OR MALIGNANCY. 5. Lymph node, sentinel, biopsy, Left axillary - ONE LYMPH NODE, POSITIVE FOR METASTATIC MAMMARY CARCINOMA (1/1). Microscopic Comment 1. BREAST, INVASIVE TUMOR, WITH LYMPH NODE SAMPLING Specimen, including laterality: Right breast Procedure: Lumpectomy Grade: III of III Tubule formation: 3 Nuclear pleomorphism: 3 Mitotic: 3 Tumor size (gross measurement: 1.5 cm Margins: Invasive, distance to closest margin: 0.9 cm In-situ, distance to closest margin: 0.1 cm If margin positive, focally or broadly: N/A Lymphovascular invasion: Absent Ductal carcinoma in situ: Present Grade: III of III Extensive intraductal component: Absent Lobular neoplasia: Absent Tumor focality: Multifocal Treatment effect: None 2 of 5 FINAL for BLUMA, BURESH ([id number]) Microscopic Comment(continued) If present, treatment effect in breast tissue, lymph nodes or both: N/A Extent of tumor: Skin: N/A Nipple: N/A Skeletal muscle: N/A Lymph nodes: # examined: 11 (Part 2) Lymph nodes with metastasis: 0 Breast prognostic profile: Estrogen receptor: Repeated, previous study demonstrated 0% positivity ([id number]) Progesterone receptor: Repeated, previous study demonstrated 0% positivity ([id number]) Her 2 neu: Repeated, previous study demonstrated no amplification (1.38) ([id number]) Ki-67: Not repeated, previous study demonstrated 92% proliferation rate ([id number]) Non-neoplastic breast: Previous biopsy site, fibrocystic change, sclerosing adenosis and benign calcifications in benign ducts  and lobules. TNM: pT1c, pN0, pMX Comments: There are a total of four distinct areas of abnormality identified. The first 1.5 cm corresponds to the invasive ductal carcinoma. The second 0.9 cm area consists of high grade ductal carcinoma in situ with associated fibrocystic change and fibroadenomatoid nodules. In this focus, the in situ carcinoma is 1 mm from the nearest lateral margin. The third 0.4 cm area consists of fat necrosis and fibrosis. There are no atypical or malignant findings in this area. The  fourth and final 0.3 cm area demonstrate non-neoplastic findings to include fibrocystic change. There are no atypical or malignant epithelial findings in this area either. 3. BREAST, INVASIVE TUMOR, WITH LYMPH NODE SAMPLING Specimen, including laterality: Left breast Procedure: Lumpectomy Grade: I of III Tubule formation: 3 Nuclear pleomorphism: 3 Mitotic: 1 Tumor size (gross measurement): 1.0 cm Margins: Invasive, distance to closest margin: Present at posterior In-situ, distance to closest margin: Present at posterior If margin positive, focally or broadly: Broadly (invasive) and focally (in situ) Lymphovascular invasion: Present Ductal carcinoma in situ: Present Grade: I of III Extensive intraductal component: Absent Lobular neoplasia: Absent Tumor focality: Unifocal Treatment effect: None If present, treatment effect in breast tissue, lymph nodes or both: N/A Extent of tumor: Skin: N/A Nipple: N/A Skeletal muscle: N/A Lymph nodes: # examined: 1 (Part 5) Lymph nodes with metastasis: 1 Isolated tumor cells (< 0.2 mm): 0 Micrometastasis: (> 0.2 mm and < 2.0 mm): 1 Macrometastasis: (> 2.0 mm): 0 3 of 5 FINAL for AJLA, MCGEACHY ([id number]) Microscopic Comment(continued) Extracapsular extension: Present Breast prognostic profile: Estrogen receptor: Not repeated, previous study demonstrated 100% positivity ([id number]). Progesterone receptor: Not repeated, previous  study demonstrated 25% positivity ([id number]) Her 2 neu: Repeated, previous study demonstrated no amplification (1.42) ([id number]) Ki-67: Not repeated, previous study demonstrated 9% proliferation rate ([id number]) Non-neoplastic breast: Previous biopsy site, fibrocystic change and microcalcifications in benign ducts and lobules. TNM: pT1b, pN14m, PMX Comments: The additional 1.0 cm and 1.1 cm hemorrhagic areas grossly identified consist of benign breast tissue with microcalcifications and parenchymal hemorrhage. There are no atypical or malignant findings in these areas. 4. The surgical resection margin(s) of the specimen were inked and microscopically evaluated. There is no mass grossly identified. Representative sections demonstrate predominantly benign fibrovascular and adipose tissue with focal areas of fibrocytic change and sclerosing adenosis. There are no atypical or malignant epithelial or stromal findings identified. 5. There are microscopic foci of intranodal metastatic mammary tumor deposits spanning up to 1 mm. There is focal extracapsular extension identified. (CR:kh 11-25-12) CMaliRUND DO Pathologist, Electronic Signature (Case signed 11/25/2012) Specimen Gross and Clinical Information   RADIOGRAPHIC STUDIES:  Chest 2 View  11/19/2012  *RADIOLOGY REPORT*  Clinical Data: Preop for breast cancer  CHEST - 2 VIEW  Comparison: None.  Findings: Cardiomediastinal silhouette is unremarkable.  No acute infiltrate or pleural effusion.  No pulmonary edema.  Bony thorax is unremarkable.  IMPRESSION: No active disease.   Original Report Authenticated By: LLahoma Crocker M.D.    Nm Sentinel Node Inj-no Rpt (breast)  11/23/2012  CLINICAL DATA: Bilateral breast cancer   Sulfur colloid was injected intradermally by the nuclear medicine  technologist for breast cancer sentinel node localization.     Dg Chest Portable 1 View  11/23/2012  *RADIOLOGY REPORT*  Clinical Data: 53year old female  status post Port-A-Cath placement.  Breast cancer.  PORTABLE CHEST - 1 VIEW  Comparison: 11/19/2012.  Findings: Portable semi upright AP view 1441 hours.  Right chest subclavian approach Port-A-Cath placed.  Acute angulation of the catheter tubing at the level of the right axilla.  Tip at the level of the cavoatrial junction.  Interval postoperative changes to the right chest wall with subcutaneous appearing drain in place.  No pneumothorax.  No pleural effusion or pulmonary edema.  Mildly increased bibasilar opacity compatible with atelectasis.  Cardiac size and mediastinal contours are within normal limits.  Visualized tracheal air column is within normal limits.  IMPRESSION: 1.  Right subclavian approach chest Port-A-Cath in place.  Tip at the level of the cavoatrial junction.  Mildly angulated catheter tubing at the level of the axilla. 2.  Interval postoperative changes to the right chest wall. No pneumothorax. 3.  Mild dependent atelectasis.   Original Report Authenticated By: Roselyn Reef, M.D.    Dg Fluoro Guide Cv Line-no Report  11/23/2012  CLINICAL DATA: bil breast cancer   FLOURO GUIDE CV LINE  Fluoroscopy was utilized by the requesting physician.  No radiographic  interpretation.     Mm Lt Plc Breast Loc Dev   1st Lesion  Inc Mammo Guide  11/23/2012  *RADIOLOGY REPORT*  Clinical Data:  Left breast cancer  NEEDLE LOCALIZATION WITH MAMMOGRAPHIC GUIDANCE AND SPECIMEN RADIOGRAPH  Comparison:  Previous exams.  Patient presents for needle localization prior to surgery.  I met with the patient and we discussed the procedure of needle localization including benefits and alternatives. We discussed the high likelihood of a successful procedure. We discussed the risks of the procedure, including infection, bleeding, tissue injury, and further surgery. Informed, written consent was given.  Using mammographic guidance, sterile technique, 2% lidocaine and a #9 modified Kopans needle, the mass and clip in the  central aspect of the left breast was localized using a superior approach.  The films are marked for Dr. Dalbert Batman.  Specimen radiograph was performed at [facility], and confirms the mass and clip were present in the tissue sample. The clip was located at the margin of the surgical specimen.  Dr.  Dalbert Batman states she had taken more tissue at the margin to clear the border.  The specimen is marked for pathology.  IMPRESSION: Needle localization of the left breast.  No apparent complications.   Original Report Authenticated By: Lillia Mountain, M.D.    Mm Rt Plc Breast Loc Dev   1st Lesion  Inc Mammo Guide  11/23/2012  *RADIOLOGY REPORT*  Clinical Data:  Known right breast cancer  NEEDLE LOCALIZATION WITH MAMMOGRAPHIC GUIDANCE AND SPECIMEN RADIOGRAPH  Comparison:  Previous exams.  Patient presents for needle localization prior to surgery.  I met with the patient and we discussed the procedure of needle localization including benefits and alternatives. We discussed the high likelihood of a successful procedure. We discussed the risks of the procedure, including infection, bleeding, tissue injury, and further surgery. Informed, written consent was given.  Using mammographic guidance, sterile technique, 2% lidocaine and a #7 and #9 modified Kopans needles, the mass and nodularity in the upper outer quadrant of the right breast was bracketedusing a lateral approach.  The films are marked for Dr. Dalbert Batman.  Specimen radiograph was performed at [facility], and confirms the mass and clip are present in the tissue sample.  The specimen is marked for pathology.  IMPRESSION: Needle localization of the right breast.  No apparent complications.   Original Report Authenticated By: Lillia Mountain, M.D.     ASSESSMENT: 53 year old female with  #1 bilateral breast cancers triple negative on the right side and ER positive on the left side. She is status post bilateral lumpectomies.  She is s/p adjuvant  Adriamycin Cytoxan given dose dense  x4 cycles followed by 6 cycles of Taxol/Carbo.  Taxol/ Carbo was discontinued after 6 cycles due to progressive neuropathy and hand foot syndrome.  Patient was then started on Gemzar Carbo every 2 weeks on 04/28/13 with day 2 neulasta.  She received 3 cycles of this combination.  All chemotherapy wascompleted on 05/13/13. Received bilateral radiation therapy at [facility], completed early December 2014. Now ready  to initiate antiestrogen therapy for the ER positive left breast cancer.  #2 Neuropathy-- Residual neuropathy associated with previous chemotherapy is stable. Patient was on neurontin but developed swelling of the bilateral lower extremities.  Patient will continue super B complex , she has declined neurontin therapy  #3 anemia due to chemotherapy, resolved.   PLAN:   #1  Patient is doing well today and has recovered well from all of her adjuvant therapy, including both chemotherapy and radiation therapy.   #2 She is ready to be started on antiestrogen therapy, and will be started on anastrozole today, 1 mg daily. We reviewed those benefits and possible side effects associated with the anastrozole, including hot flashes, joint pain, vaginal dryness, and increased risk of osteoporosis.  She has not had a bone density scan in greater than 2 years, and this will be ordered for a new baseline study. (She will also be due for her annual mammogram in either late February or early March, so we will try to coordinate those appointments at the [facility] prior to her next appointment here.)    All questions were answered, and the patient was given all the above information in writing. She voiced both her understanding and agreement with the plan as detailed above, but knows to call our office with any changes or problems prior to her next appointment. We'll plan on seeing her back in mid March for assessment of tolerance once she has been on this drug for approximately 2 months. We will repeat labs at that  time, including a vitamin D level.   I spent 40 minutes counseling the patient face to face with regards to her treatment options and associated benefits/side effects. The total time spent in the appointment was 50 minutes.  Micah Flesher, PA-C Medical/Oncology [place] [phone] (Office)  09/02/2013, 12:34 PM

## 2013-10-20 ENCOUNTER — Ambulatory Visit: Payer: Self-pay | Admitting: Oncology

## 2013-10-29 ENCOUNTER — Ambulatory Visit
Admission: RE | Admit: 2013-10-29 | Discharge: 2013-10-29 | Disposition: A | Discharge status: Home / Self Care | Payer: No Typology Code available for payment source | Source: Ambulatory Visit | Attending: Physician Assistant | Admitting: Physician Assistant

## 2013-10-29 ENCOUNTER — Other Ambulatory Visit: Payer: Self-pay | Admitting: Physician Assistant

## 2013-10-29 DIAGNOSIS — Z780 Asymptomatic menopausal state: Secondary | ICD-10-CM

## 2013-10-29 DIAGNOSIS — Z853 Personal history of malignant neoplasm of breast: Secondary | ICD-10-CM

## 2013-11-02 ENCOUNTER — Ambulatory Visit (HOSPITAL_BASED_OUTPATIENT_CLINIC_OR_DEPARTMENT_OTHER): Payer: BC Managed Care – PPO | Admitting: Oncology

## 2013-11-02 ENCOUNTER — Encounter: Payer: Self-pay | Admitting: Oncology

## 2013-11-02 ENCOUNTER — Other Ambulatory Visit (HOSPITAL_BASED_OUTPATIENT_CLINIC_OR_DEPARTMENT_OTHER): Payer: BC Managed Care – PPO

## 2013-11-02 ENCOUNTER — Telehealth: Payer: Self-pay | Admitting: *Deleted

## 2013-11-02 VITALS — BP 116/83 | HR 90 | Temp 98.3°F | Resp 18 | Ht 63.0 in | Wt 178.7 lb

## 2013-11-02 DIAGNOSIS — C50419 Malignant neoplasm of upper-outer quadrant of unspecified female breast: Secondary | ICD-10-CM | POA: Diagnosis not present

## 2013-11-02 DIAGNOSIS — C50411 Malignant neoplasm of upper-outer quadrant of right female breast: Secondary | ICD-10-CM

## 2013-11-02 DIAGNOSIS — C50112 Malignant neoplasm of central portion of left female breast: Secondary | ICD-10-CM

## 2013-11-02 DIAGNOSIS — Z780 Asymptomatic menopausal state: Secondary | ICD-10-CM

## 2013-11-02 DIAGNOSIS — G589 Mononeuropathy, unspecified: Secondary | ICD-10-CM

## 2013-11-02 DIAGNOSIS — C50919 Malignant neoplasm of unspecified site of unspecified female breast: Secondary | ICD-10-CM

## 2013-11-02 DIAGNOSIS — F17200 Nicotine dependence, unspecified, uncomplicated: Secondary | ICD-10-CM

## 2013-11-02 LAB — COMPREHENSIVE METABOLIC PANEL (CC13)
ALBUMIN: 3.7 g/dL (ref 3.5–5.0)
ALK PHOS: 84 U/L (ref 40–150)
ALT: 22 U/L (ref 0–55)
AST: 20 U/L (ref 5–34)
Anion Gap: 10 mEq/L (ref 3–11)
BUN: 9.3 mg/dL (ref 7.0–26.0)
CO2: 24 mEq/L (ref 22–29)
Calcium: 9.5 mg/dL (ref 8.4–10.4)
Chloride: 106 mEq/L (ref 98–109)
Creatinine: 0.7 mg/dL (ref 0.6–1.1)
Glucose: 103 mg/dl (ref 70–140)
POTASSIUM: 3.9 meq/L (ref 3.5–5.1)
Sodium: 140 mEq/L (ref 136–145)
Total Bilirubin: 0.48 mg/dL (ref 0.20–1.20)
Total Protein: 6.2 g/dL — ABNORMAL LOW (ref 6.4–8.3)

## 2013-11-02 LAB — CBC WITH DIFFERENTIAL/PLATELET
BASO%: 0.4 % (ref 0.0–2.0)
BASOS ABS: 0 10*3/uL (ref 0.0–0.1)
EOS%: 1.2 % (ref 0.0–7.0)
Eosinophils Absolute: 0.1 10*3/uL (ref 0.0–0.5)
HCT: 39.5 % (ref 34.8–46.6)
HEMOGLOBIN: 13.3 g/dL (ref 11.6–15.9)
LYMPH%: 23.3 % (ref 14.0–49.7)
MCH: 32.7 pg (ref 25.1–34.0)
MCHC: 33.5 g/dL (ref 31.5–36.0)
MCV: 97.4 fL (ref 79.5–101.0)
MONO#: 0.7 10*3/uL (ref 0.1–0.9)
MONO%: 8 % (ref 0.0–14.0)
NEUT#: 5.6 10*3/uL (ref 1.5–6.5)
NEUT%: 67.1 % (ref 38.4–76.8)
Platelets: 247 10*3/uL (ref 145–400)
RBC: 4.06 10*6/uL (ref 3.70–5.45)
RDW: 14.3 % (ref 11.2–14.5)
WBC: 8.4 10*3/uL (ref 3.9–10.3)
lymph#: 1.9 10*3/uL (ref 0.9–3.3)

## 2013-11-02 MED ORDER — ANASTROZOLE 1 MG PO TABS: 1.0000 mg | ORAL_TABLET | Freq: Every day | ORAL | Status: DC

## 2013-11-02 NOTE — Telephone Encounter (Signed)
appts made and printed...td 

## 2013-11-02 NOTE — Patient Instructions (Signed)
Smoking Cessation Quitting smoking is important to your health and has many advantages. However, it is not always easy to quit since nicotine is a very addictive drug. Often times, people try 3 times or more before being able to quit. This document explains the best ways for you to prepare to quit smoking. Quitting takes hard work and a lot of effort, but you can do it. ADVANTAGES OF QUITTING SMOKING  You will live longer, feel better, and live better.  Your body will feel the impact of quitting smoking almost immediately.  Within 20 minutes, blood pressure decreases. Your pulse returns to its normal level.  After 8 hours, carbon monoxide levels in the blood return to normal. Your oxygen level increases.  After 24 hours, the chance of having a heart attack starts to decrease. Your breath, hair, and body stop smelling like smoke.  After 48 hours, damaged nerve endings begin to recover. Your sense of taste and smell improve.  After 72 hours, the body is virtually free of nicotine. Your bronchial tubes relax and breathing becomes easier.  After 2 to 12 weeks, lungs can hold more air. Exercise becomes easier and circulation improves.  The risk of having a heart attack, stroke, cancer, or lung disease is greatly reduced.  After 1 year, the risk of coronary heart disease is cut in half.  After 5 years, the risk of stroke falls to the same as a nonsmoker.  After 10 years, the risk of lung cancer is cut in half and the risk of other cancers decreases significantly.  After 15 years, the risk of coronary heart disease drops, usually to the level of a nonsmoker.  If you are pregnant, quitting smoking will improve your chances of having a healthy baby.  The people you live with, especially any children, will be healthier.  You will have extra money to spend on things other than cigarettes. QUESTIONS TO THINK ABOUT BEFORE ATTEMPTING TO QUIT You may want to talk about your answers with your  caregiver.  Why do you want to quit?  If you tried to quit in the past, what helped and what did not?  What will be the most difficult situations for you after you quit? How will you plan to handle them?  Who can help you through the tough times? Your family? Friends? A caregiver?  What pleasures do you get from smoking? What ways can you still get pleasure if you quit? Here are some questions to ask your caregiver:  How can you help me to be successful at quitting?  What medicine do you think would be best for me and how should I take it?  What should I do if I need more help?  What is smoking withdrawal like? How can I get information on withdrawal? GET READY  Set a quit date.  Change your environment by getting rid of all cigarettes, ashtrays, matches, and lighters in your home, car, or work. Do not let people smoke in your home.  Review your past attempts to quit. Think about what worked and what did not. GET SUPPORT AND ENCOURAGEMENT You have a better chance of being successful if you have help. You can get support in many ways.  Tell your family, friends, and co-workers that you are going to quit and need their support. Ask them not to smoke around you.  Get individual, group, or telephone counseling and support. Programs are available at local hospitals and health centers. Call your local health department for   information about programs in your area.  Spiritual beliefs and practices may help some smokers quit.  Download a "quit meter" on your computer to keep track of quit statistics, such as how long you have gone without smoking, cigarettes not smoked, and money saved.  Get a self-help book about quitting smoking and staying off of tobacco. LEARN NEW SKILLS AND BEHAVIORS  Distract yourself from urges to smoke. Talk to someone, go for a walk, or occupy your time with a task.  Change your normal routine. Take a different route to work. Drink tea instead of coffee.  Eat breakfast in a different place.  Reduce your stress. Take a hot bath, exercise, or read a book.  Plan something enjoyable to do every day. Reward yourself for not smoking.  Explore interactive web-based programs that specialize in helping you quit. GET MEDICINE AND USE IT CORRECTLY Medicines can help you stop smoking and decrease the urge to smoke. Combining medicine with the above behavioral methods and support can greatly increase your chances of successfully quitting smoking.  Nicotine replacement therapy helps deliver nicotine to your body without the negative effects and risks of smoking. Nicotine replacement therapy includes nicotine gum, lozenges, inhalers, nasal sprays, and skin patches. Some may be available over-the-counter and others require a prescription.  Antidepressant medicine helps people abstain from smoking, but how this works is unknown. This medicine is available by prescription.  Nicotinic receptor partial agonist medicine simulates the effect of nicotine in your brain. This medicine is available by prescription. Ask your caregiver for advice about which medicines to use and how to use them based on your health history. Your caregiver will tell you what side effects to look out for if you choose to be on a medicine or therapy. Carefully read the information on the package. Do not use any other product containing nicotine while using a nicotine replacement product.  RELAPSE OR DIFFICULT SITUATIONS Most relapses occur within the first 3 months after quitting. Do not be discouraged if you start smoking again. Remember, most people try several times before finally quitting. You may have symptoms of withdrawal because your body is used to nicotine. You may crave cigarettes, be irritable, feel very hungry, cough often, get headaches, or have difficulty concentrating. The withdrawal symptoms are only temporary. They are strongest when you first quit, but they will go away within  10 14 days. To reduce the chances of relapse, try to:  Avoid drinking alcohol. Drinking lowers your chances of successfully quitting.  Reduce the amount of caffeine you consume. Once you quit smoking, the amount of caffeine in your body increases and can give you symptoms, such as a rapid heartbeat, sweating, and anxiety.  Avoid smokers because they can make you want to smoke.  Do not let weight gain distract you. Many smokers will gain weight when they quit, usually less than 10 pounds. Eat a healthy diet and stay active. You can always lose the weight gained after you quit.  Find ways to improve your mood other than smoking. FOR MORE INFORMATION  www.smokefree.gov  Document Released: 08/06/2001 Document Revised: 02/11/2012 Document Reviewed: 11/21/2011 [employer] Patient Information 2014 [employer]  

## 2013-11-02 NOTE — Progress Notes (Signed)
OFFICE PROGRESS NOTE  CC  Amy Pardon, MD [facility] [address], [place] [address] Dr. Fanny Leonard  Dr. Gery Leonard  DIAGNOSIS: 53 year old female with new diagnosis of bilateral breast cancers.   STAGE:  Cancer of upper-outer quadrant of female breast  Primary site: Breast (Bilateral)  Staging method: AJCC 7th Edition  Clinical: Stage IA (T1c, N0, cM0)  Summary: Stage IA (T1c, N0, cM0)   PRIOR THERAPY: #1Patient recently underwent screening mammograms and she was found to have bilateral breast abnormalities.diagnostic mammogram showed in the right breast an ill-defined 2 cm mass with a few associated well defined microcalcifications over the upper outer quadrant. Spot compression images of the left breast demonstrated an ill defined spiculated 8 mm mass centrally. Ultrasound showed irregular bordered heterogeneous hypoechoic solid mass at the 10:00 position of the right breast 6 cm from the nipple measuring 1.1 x 1.3 x 1.4 cm. Also in this location was an adjacent smaller irregular hypoechoic mass measuring 3 x 5 x 6 mm. The smaller mass was 1 cm from the large more irregular mass. Ultrasound of the axilla demonstrated single lymph node with thickened cortex. Ultrasound of the left breast demonstrated an ill-defined hypoechoic mass with distal acoustic shadowing 7 to 8:00 position 3 cm from the nipple located deep and measuring 5 x 8 x 8 mm ultrasound of the left axilla showed normal appearing lymph nodes.   #2Patient went on to have needle core biopsies performed of both of the masses. 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 The tumor was ER positive PR positive with a Ki-67 of 9% the second tumor was ER negative PR negative HER-2/neu negative with Ki-6792% and elevated.  #3 Patient had MRIs of the breasts performed. There was  noted to be 1.7 cm irregular enhancing mass located within the upper-outer quadrant of the right breast with 3 adjacent worrisome enhancing satellite nodules in aggregate the nodules and mass measures 3.8 cm. In the left breast 8 mm irregular enhancing mass located within the central left breast was noted. No evidence of axillary or internal mammary adenopathy.  #4  status post bilateral lumpectomies. The right breast revealed a 1.5 cm grade 3 invasive ductal carcinoma ER/PR negative HER-2/neu negative Ki-67 92%. Sentinel node was negative. Left breast showed 1.0 cm grade 3 invasive ductal carcinoma ER/PR positive HER-2/neu negative sentinel node was negative.  #5 s/p adjuvant chemotherapy consisting of Adriamycin Cytoxan every 2 weeks for a total of 4 cycles 12/30/12 - 02/17/13  She received 6 cycles of Taxol carbo from 03/10/13 - 04/14/13 and it was discontinued due to progressive neuropathy and hand foot syndrome.  She was then started on Gemzar Carbo 04/28/13 and received 3 cycles of this.  Patient will need adjuvant radiation therapy.   #6  patient is status post radiation therapy at  [facility] with Dr. Berton Leonard, completed early December 2014   #7  patient was begun on curative intent anastrozole 1 mg daily starting January 2015. Total of 5 years of therapy is planned  CURRENT THERAPY:  Anastrozole 1 mg daily  INTERVAL HISTORY: Amy Leonard 53 y.o. female returns today for followup of her bilateral breast carcinomas. Clinically she seems to be doing well. She was begun on anastrozole 1 mg daily back in January 2015. She tells me she is doing well. She did have a mammogram  performed and she tells me that they found some new calcifications. A followup mammogram is set up for 6 months. She continues to smoke. We discussed at length smoking cessation. She is open to go on for the smoking cessation program at . Today she denies any headaches double vision blurring of vision fevers chills night  sweats. No shortness of breath chest pains palpitations. No abdominal pain no diarrhea or constipation. She has no easy bruising or bleeding. She has no myalgias and arthralgias. No peripheral paresthesias or gait disturbances. Remainder of the 10 point review of systems is negative.   MEDICAL HISTORY: Past Medical History  Diagnosis Date  . Elevated WBC count     nl diff  . Chronic back pain     spinal stenosis  . Tobacco abuse   . Anxiety and depression   . HSV infection     recurrent (side and buttox)  . Migraine   . Glaucoma     ?  . Carpal tunnel syndrome   . HLD (hyperlipidemia)   . Folliculitis 09/30/9561  . Anxiety   . Depression   . 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 GERD (gastroesophageal reflux disease)   . Hypertension   . Breast cancer   . Wears dentures     top    ALLERGIES:  is allergic to bupropion hcl; chlorhexidine gluconate; hydrocod polst-cpm polst er; lisinopril; and naproxen sodium.  MEDICATIONS:  Current Outpatient Prescriptions  Medication Sig Dispense Refill  . Acetaminophen (TYLENOL ARTHRITIS PAIN PO) Take 2 tablets by mouth as needed (pain).       Marland Kitchen acyclovir ointment (ZOVIRAX) 5 % Apply 1 application topically as needed (flare-up).  15 g  1  . anastrozole (ARIMIDEX) 1 MG tablet Take 1 tablet (1 mg total) by mouth daily.  90 tablet  3  . aspirin 81 MG chewable tablet Chew 81 mg by mouth daily.      Marland Kitchen atorvastatin (LIPITOR) 10 MG tablet Take 1 tablet (10 mg total) by mouth daily. *needs appointment with doctor before future refills are given*  90 tablet  0  . calcium carbonate (TUMS - DOSED IN MG ELEMENTAL CALCIUM) 500 MG chewable tablet Chew by mouth as directed.        Marland Kitchen losartan (COZAAR) 50 MG tablet Take 1 tablet (50 mg total) by mouth daily.  90 tablet  2  . nicotine (NICODERM CQ - DOSED IN MG/24 HOURS) 21 mg/24hr patch Place 1 patch onto the skin daily.  28 patch  0  . omeprazole (PRILOSEC) 40 MG capsule Take 1 capsule (40 mg total) by mouth  2 (two) times daily.  180 capsule  3  . HYDROcodone-acetaminophen (NORCO/VICODIN) 5-325 MG per tablet Take 1-2 tablets by mouth every 4 (four) hours as needed.  20 tablet  0  . ranitidine (ZANTAC) 300 MG tablet Take 150 mg by mouth at bedtime.       . simethicone (MYLICON) 80 MG chewable tablet Chew 1 tablet (80 mg total) by mouth every 8 (eight) hours as needed for flatulence.  60 tablet  3   No current facility-administered medications for this visit.    SURGICAL HISTORY:  Past Surgical History  Procedure Laterality Date  . Btl    . Epidural steroid injection  2008  . Colonoscopy    . Upper gi endoscopy    . Partial mastectomy with needle localization and axillary sentinel lymph node bx Bilateral 11/23/2012    Procedure: PARTIAL MASTECTOMY WITH NEEDLE LOCALIZATION  AND AXILLARY SENTINEL LYMPH NODE BX;  Surgeon: Adin Hector, MD;  Location: [place];  Service: General;  Laterality: Bilateral;  . Portacath placement Right 11/23/2012    Procedure: INSERTION PORT-A-CATH;  Surgeon: Adin Hector, MD;  Location: [place];  Service: General;  Laterality: Right;  . Port-a-cath removal Right 08/25/2013    Procedure: REMOVAL PORT-A-CATH;  Surgeon: Adin Hector, MD;  Location: [place];  Service: General;  Laterality: Right;    REVIEW OF SYSTEMS:  A 10 point review of systems was conducted and is otherwise negative except for what is noted above.     PHYSICAL EXAMINATION: Blood pressure 116/83, pulse 90, temperature 98.3 F (36.8 C), temperature source Oral, resp. rate 18, height 5' 3" (1.6 m), weight 178 lb 11.2 oz (81.058 kg). Body mass index is 31.66 kg/(m^2).   ECOG: 0 General: Patient is a well appearing female in no acute distress Filed Weights   11/02/13 1057  Weight: 178 lb 11.2 oz (81.058 kg)  Physical Exam: HEENT:  Sclerae anicteric. Conjunctiva pink. Oropharynx clear and moist. Neck is supple. Trachea midline.  NODES:  No  cervical, supraclavicular, or axillary lymphadenopathy palpated on either the right or the left  BREAST EXAM: status post bilateral lumpectomies and bilateral radiation. Incisions are well-healed. There is some mild residual erythema bilaterally status post radiation therapy, but no moist desquamation, and no additional skin changes. No evidence of local recurrence. Port has been removed from the right upper chest wall with well-healed incision. LUNGS:  Clear to auscultation bilaterally.  No wheezes or rhonchi HEART:  Regular rate and rhythm. No murmur appreciated ABDOMEN:  Soft, nondistended, nontender.  Positive bowel sounds. No organomegaly or masses palpated. MSK:  No focal spinal tenderness to palpation. Good range of motion bilaterally in the upper extremities. EXTREMITIES:  No peripheral edema.   SKIN:  Benign with no obvious skin rashes or lesions. No nail dyscrasia. NEURO:  Nonfocal. Well oriented.  Appropriate affect.   LABORATORY DATA: Lab Results  Component Value Date   WBC 8.4 11/02/2013   HGB 13.3 11/02/2013   HCT 39.5 11/02/2013   MCV 97.4 11/02/2013   PLT 247 11/02/2013      Chemistry      Component Value Date/Time   NA 140 11/02/2013 1040   NA 143 08/25/2013 1205   K 3.9 11/02/2013 1040   K 3.6* 08/25/2013 1205   CL 105 08/25/2013 1205   CL 109* 02/17/2013 1120   CO2 24 11/02/2013 1040   CO2 28 01/08/2013 0555   BUN 9.3 11/02/2013 1040   BUN 14 08/25/2013 1205   CREATININE 0.7 11/02/2013 1040   CREATININE 0.70 08/25/2013 1205      Component Value Date/Time   CALCIUM 9.5 11/02/2013 1040   CALCIUM 9.1 01/08/2013 0555   ALKPHOS 84 11/02/2013 1040   ALKPHOS 67 01/05/2013 2245   AST 20 11/02/2013 1040   AST 10 01/05/2013 2245   ALT 22 11/02/2013 1040   ALT 15 01/05/2013 2245   BILITOT 0.48 11/02/2013 1040   BILITOT 0.5 01/05/2013 2245    ADDITIONAL INFORMATION: 1. CHROMOGENIC IN-SITU HYBRIDIZATION Results: HER-2/NEU BY CISH - NO AMPLIFICATION OF HER-2  DETECTED. RESULT RATIO OF HER2: CEP 17 SIGNALS 1.00 AVERAGE HER2 COPY NUMBER PER CELL 1.55 REFERENCE RANGE NEGATIVE HER2/Chr17 Ratio <2.0 and Average HER2 copy number <4.0 EQUIVOCAL HER2/Chr17 Ratio <2.0 and Average HER2 copy number 4.0 and <6.0 POSITIVE HER2/Chr17 Ratio >=2.0 and/or Average HER2 copy  number >=6.0 Amy Cutter MD Pathologist, Electronic Signature ( Signed 12/01/2012) 3. CHROMOGENIC IN-SITU HYBRIDIZATION Results: HER-2/NEU BY CISH - NO AMPLIFICATION OF HER-2 DETECTED. RESULT RATIO OF HER2: CEP 17 SIGNALS 1.22 AVERAGE HER2 COPY NUMBER PER CELL 1.40 REFERENCE RANGE NEGATIVE HER2/Chr17 Ratio <2.0 and Average HER2 copy number <4.0 EQUIVOCAL HER2/Chr17 Ratio <2.0 and Average HER2 copy number 4.0 and <6.0 POSITIVE HER2/Chr17 Ratio >=2.0 and/or Average HER2 copy number >=6.0 1 of 5 FINAL for Amy, Leonard ([id number]) ADDITIONAL INFORMATION:(continued) Amy Cutter MD Pathologist, Electronic Signature ( Signed 12/01/2012) FINAL DIAGNOSIS Diagnosis 1. Breast, lumpectomy, Right - INVASIVE DUCTAL CARCINOMA, GRADE III, SEE COMMENT. - INVASIVE CARCINOMA IS 0.9 CM FROM NEAREST MARGIN (INFERIOR). - NO LYMPHOVASCULAR INVASION IDENTIFIED. - HIGH GRADE DUCTAL CARCINOMA IN SITU - IN SITU CARCINOMA IS 1 MM FROM THE NEAREST LATERAL MARGIN - SEE TUMOR SYNOPTIC TEMPLATE BELOW. 2. Lymph nodes, regional resection, Right axilla - ELEVEN LYMPH NODES, NEGATIVE FOR TUMOR (0/11). 3. Breast, lumpectomy, Left - INVASIVE DUCTAL CARCINOMA, GRADE I, SEE COMMENT. - LYMPHOVASCULAR INVASION IDENTIFIED - INVASIVE CARCINOMA IS BROADLY PRESENT AT POSTERIOR MARGIN - DUCTAL CARCINOMA IN SITU, GRADE I. - IN SITU CARCINOMA IS FOCALLY PRESENT AT POSTERIOR MARGIN - SEE TUMOR SYNOPTIC TEMPLATE BELOW. 4. Breast, excision, Left - BENIGN BREAST TISSUE, SEE COMMENT. - NEGATIVE FOR ATYPIA OR MALIGNANCY. - SURGICAL MARGIN, NEGATIVE FOR ATYPIA OR MALIGNANCY. 5. Lymph node, sentinel, biopsy, Left  axillary - ONE LYMPH NODE, POSITIVE FOR METASTATIC MAMMARY CARCINOMA (1/1). Microscopic Comment 1. BREAST, INVASIVE TUMOR, WITH LYMPH NODE SAMPLING Specimen, including laterality: Right breast Procedure: Lumpectomy Grade: III of III Tubule formation: 3 Nuclear pleomorphism: 3 Mitotic: 3 Tumor size (gross measurement: 1.5 cm Margins: Invasive, distance to closest margin: 0.9 cm In-situ, distance to closest margin: 0.1 cm If margin positive, focally or broadly: N/A Lymphovascular invasion: Absent Ductal carcinoma in situ: Present Grade: III of III Extensive intraductal component: Absent Lobular neoplasia: Absent Tumor focality: Multifocal Treatment effect: None 2 of 5 FINAL for Amy, Leonard ([id number]) Microscopic Comment(continued) If present, treatment effect in breast tissue, lymph nodes or both: N/A Extent of tumor: Skin: N/A Nipple: N/A Skeletal muscle: N/A Lymph nodes: # examined: 11 (Part 2) Lymph nodes with metastasis: 0 Breast prognostic profile: Estrogen receptor: Repeated, previous study demonstrated 0% positivity ([id number]) Progesterone receptor: Repeated, previous study demonstrated 0% positivity ([id number]) Her 2 neu: Repeated, previous study demonstrated no amplification (1.38) ([id number]) Ki-67: Not repeated, previous study demonstrated 92% proliferation rate ([id number]) Non-neoplastic breast: Previous biopsy site, fibrocystic change, sclerosing adenosis and benign calcifications in benign ducts and lobules. TNM: pT1c, pN0, pMX Comments: There are a total of four distinct areas of abnormality identified. The first 1.5 cm corresponds to the invasive ductal carcinoma. The second 0.9 cm area consists of high grade ductal carcinoma in situ with associated fibrocystic change and fibroadenomatoid nodules. In this focus, the in situ carcinoma is 1 mm from the nearest lateral margin. The third 0.4 cm area consists of fat necrosis and fibrosis. There  are no atypical or malignant findings in this area. The fourth and final 0.3 cm area demonstrate non-neoplastic findings to include fibrocystic change. There are no atypical or malignant epithelial findings in this area either. 3. BREAST, INVASIVE TUMOR, WITH LYMPH NODE SAMPLING Specimen, including laterality: Left breast Procedure: Lumpectomy Grade: I of III Tubule formation: 3 Nuclear pleomorphism: 3 Mitotic: 1 Tumor size (gross measurement): 1.0 cm Margins: Invasive, distance to closest margin: Present at posterior In-situ, distance to closest margin: Present at posterior If  margin positive, focally or broadly: Broadly (invasive) and focally (in situ) Lymphovascular invasion: Present Ductal carcinoma in situ: Present Grade: I of III Extensive intraductal component: Absent Lobular neoplasia: Absent Tumor focality: Unifocal Treatment effect: None If present, treatment effect in breast tissue, lymph nodes or both: N/A Extent of tumor: Skin: N/A Nipple: N/A Skeletal muscle: N/A Lymph nodes: # examined: 1 (Part 5) Lymph nodes with metastasis: 1 Isolated tumor cells (< 0.2 mm): 0 Micrometastasis: (> 0.2 mm and < 2.0 mm): 1 Macrometastasis: (> 2.0 mm): 0 3 of 5 FINAL for Amy, OLTMANN ([id number]) Microscopic Comment(continued) Extracapsular extension: Present Breast prognostic profile: Estrogen receptor: Not repeated, previous study demonstrated 100% positivity ([id number]). Progesterone receptor: Not repeated, previous study demonstrated 25% positivity ([id number]) Her 2 neu: Repeated, previous study demonstrated no amplification (1.42) ([id number]) Ki-67: Not repeated, previous study demonstrated 9% proliferation rate ([id number]) Non-neoplastic breast: Previous biopsy site, fibrocystic change and microcalcifications in benign ducts and lobules. TNM: pT1b, pN68m, PMX Comments: The additional 1.0 cm and 1.1 cm hemorrhagic areas grossly identified consist of  benign breast tissue with microcalcifications and parenchymal hemorrhage. There are no atypical or malignant findings in these areas. 4. The surgical resection margin(s) of the specimen were inked and microscopically evaluated. There is no mass grossly identified. Representative sections demonstrate predominantly benign fibrovascular and adipose tissue with focal areas of fibrocytic change and sclerosing adenosis. There are no atypical or malignant epithelial or stromal findings identified. 5. There are microscopic foci of intranodal metastatic mammary tumor deposits spanning up to 1 mm. There is focal extracapsular extension identified. (CR:kh 11-25-12) CMaliRUND DO Pathologist, Electronic Signature (Case signed 11/25/2012) Specimen Gross and Clinical Information   RADIOGRAPHIC STUDIES:  Chest 2 View  11/19/2012  *RADIOLOGY REPORT*  Clinical Data: Preop for breast cancer  CHEST - 2 VIEW  Comparison: None.  Findings: Cardiomediastinal silhouette is unremarkable.  No acute infiltrate or pleural effusion.  No pulmonary edema.  Bony thorax is unremarkable.  IMPRESSION: No active disease.   Original Report Authenticated By: LLahoma Crocker M.D.    Nm Sentinel Node Inj-no Rpt (breast)  11/23/2012  CLINICAL DATA: Bilateral breast cancer   Sulfur colloid was injected intradermally by the nuclear medicine  technologist for breast cancer sentinel node localization.     Dg Chest Portable 1 View  11/23/2012  *RADIOLOGY REPORT*  Clinical Data: 53year old female status post Port-A-Cath placement.  Breast cancer.  PORTABLE CHEST - 1 VIEW  Comparison: 11/19/2012.  Findings: Portable semi upright AP view 1441 hours.  Right chest subclavian approach Port-A-Cath placed.  Acute angulation of the catheter tubing at the level of the right axilla.  Tip at the level of the cavoatrial junction.  Interval postoperative changes to the right chest wall with subcutaneous appearing drain in place.  No pneumothorax.  No pleural  effusion or pulmonary edema.  Mildly increased bibasilar opacity compatible with atelectasis.  Cardiac size and mediastinal contours are within normal limits.  Visualized tracheal air column is within normal limits.  IMPRESSION: 1.  Right subclavian approach chest Port-A-Cath in place.  Tip at the level of the cavoatrial junction.  Mildly angulated catheter tubing at the level of the axilla. 2.  Interval postoperative changes to the right chest wall. No pneumothorax. 3.  Mild dependent atelectasis.   Original Report Authenticated By: HRoselyn Reef M.D.    Dg Fluoro Guide Cv Line-no Report  11/23/2012  CLINICAL DATA: bil breast cancer   FLOURO GUIDE CV LINE  Fluoroscopy was utilized by the  requesting physician.  No radiographic  interpretation.     Mm Lt Plc Breast Loc Dev   1st Lesion  Inc Mammo Guide  11/23/2012  *RADIOLOGY REPORT*  Clinical Data:  Left breast cancer  NEEDLE LOCALIZATION WITH MAMMOGRAPHIC GUIDANCE AND SPECIMEN RADIOGRAPH  Comparison:  Previous exams.  Patient presents for needle localization prior to surgery.  I met with the patient and we discussed the procedure of needle localization including benefits and alternatives. We discussed the high likelihood of a successful procedure. We discussed the risks of the procedure, including infection, bleeding, tissue injury, and further surgery. Informed, written consent was given.  Using mammographic guidance, sterile technique, 2% lidocaine and a #9 modified Kopans needle, the mass and clip in the central aspect of the left breast was localized using a superior approach.  The films are marked for Dr. Dalbert Batman.  Specimen radiograph was performed at [facility], and confirms the mass and clip were present in the tissue sample. The clip was located at the margin of the surgical specimen.  Dr.  Dalbert Batman states she had taken more tissue at the margin to clear the border.  The specimen is marked for pathology.  IMPRESSION: Needle localization of the left  breast.  No apparent complications.   Original Report Authenticated By: Lillia Mountain, M.D.    Mm Rt Plc Breast Loc Dev   1st Lesion  Inc Mammo Guide  11/23/2012  *RADIOLOGY REPORT*  Clinical Data:  Known right breast cancer  NEEDLE LOCALIZATION WITH MAMMOGRAPHIC GUIDANCE AND SPECIMEN RADIOGRAPH  Comparison:  Previous exams.  Patient presents for needle localization prior to surgery.  I met with the patient and we discussed the procedure of needle localization including benefits and alternatives. We discussed the high likelihood of a successful procedure. We discussed the risks of the procedure, including infection, bleeding, tissue injury, and further surgery. Informed, written consent was given.  Using mammographic guidance, sterile technique, 2% lidocaine and a #7 and #9 modified Kopans needles, the mass and nodularity in the upper outer quadrant of the right breast was bracketedusing a lateral approach.  The films are marked for Dr. Dalbert Batman.  Specimen radiograph was performed at [facility], and confirms the mass and clip are present in the tissue sample.  The specimen is marked for pathology.  IMPRESSION: Needle localization of the right breast.  No apparent complications.   Original Report Authenticated By: Lillia Mountain, M.D.     ASSESSMENT: 53 year old female with  #1 bilateral breast cancers triple negative on the right side and ER positive on the left side. She is status post bilateral lumpectomies.  She is s/p adjuvant  Adriamycin Cytoxan given dose dense x4 cycles followed by 6 cycles of Taxol/Carbo.  Taxol/ Carbo was discontinued after 6 cycles due to progressive neuropathy and hand foot syndrome.  Patient was then started on Gemzar Carbo every 2 weeks on 04/28/13 with day 2 neulasta.  She received 3 cycles of this combination.  All chemotherapy wascompleted on 05/13/13. Received bilateral radiation therapy at [facility], completed early December 2014. Now ready to initiate antiestrogen therapy for the ER  positive left breast cancer.  #2 Neuropathy-- Residual neuropathy associated with previous chemotherapy is stable. Patient was on neurontin but developed swelling of the bilateral lower extremities.  Patient will continue super B complex , she has declined neurontin therapy  #3 anemia due to chemotherapy, resolved.   PLAN:   #1 continue Arimidex 1 mg daily. Overall she's tolerating it well without any significant problems.  #  2 tobacco abuse: Patient still continues to smoke. We discussed smoking cessation program. She is open to it. And I have given her a referral for the next upcoming program.  #3 patient will be seen back in 6 months time for followup.  I spent 25 minutes counseling the patient face to face with regards to her treatment options and associated benefits/side effects. The total time spent in the appointment was 50 minutes.  Durene Fruits Medical/Oncology [place] [phone] (Office)  11/02/2013, 11:30 AM

## 2013-11-02 NOTE — Progress Notes (Signed)
Per the members policy, this charge needed to have a pre authorization. As there was not a pre authorization obtained through [employer] for this CPT, this is now a non-covered charge and it will be patient responsibility per the patients policy. I spoke with Jonelle Sidle ref #[id number] DOS 04/29/2013 for this CPT code.  I do believe pt has financial assistance on her account as when this sent to pt balance it was automatically adjusted off at 100%? Will advise billing her discount was 15% not 100%.-- this was for date of service 05/03/13.  Called to advise 1st Step to advise patient is no longer on Neulasta.

## 2013-11-03 ENCOUNTER — Telehealth: Payer: Self-pay

## 2013-11-03 LAB — VITAMIN D 25 HYDROXY (VIT D DEFICIENCY, FRACTURES): Vit D, 25-Hydroxy: 14 ng/mL — ABNORMAL LOW (ref 30–89)

## 2013-11-03 NOTE — Telephone Encounter (Signed)
Message copied by Prentiss Bells on Wed Nov 03, 2013  3:36 PM ------      Message from: Deatra Robinson      Created: Wed Nov 03, 2013 12:53 PM       Vitamin D level low            Take vitamin D2 50,000 units weekly            Recheck in 6 months with her next MD visit ------

## 2013-11-03 NOTE — Telephone Encounter (Signed)
LMOVM - pt to call [place] desk nurse.

## 2013-11-04 ENCOUNTER — Telehealth: Payer: Self-pay

## 2013-11-04 NOTE — Telephone Encounter (Signed)
Message copied by Prentiss Bells on Thu Nov 04, 2013 12:53 PM ------      Message from: Deatra Robinson      Created: Wed Nov 03, 2013 12:53 PM       Vitamin D level low            Take vitamin D2 50,000 units weekly            Recheck in 6 months with her next MD visit ------

## 2013-11-04 NOTE — Telephone Encounter (Signed)
Per [facility] inbasket msg, pt instructed to take Vit D2 50,000 units weekly.  Recheck in 6 mos with follow up.  Pt voiced understanding.

## 2013-11-05 ENCOUNTER — Telehealth: Payer: Self-pay

## 2013-11-05 NOTE — Telephone Encounter (Signed)
Pt called - could not remember dose Vit D - stated ok to leave amount on her VM.  LMOVM with units 50,000.

## 2013-11-09 ENCOUNTER — Telehealth: Payer: Self-pay | Admitting: *Deleted

## 2013-11-09 ENCOUNTER — Other Ambulatory Visit: Payer: Self-pay | Admitting: *Deleted

## 2013-11-09 DIAGNOSIS — C50919 Malignant neoplasm of unspecified site of unspecified female breast: Secondary | ICD-10-CM

## 2013-11-09 MED ORDER — ERGOCALCIFEROL 1.25 MG (50000 UT) PO CAPS
50000.0000 [IU] | ORAL_CAPSULE | ORAL | Status: DC
Start: 2013-11-09 — End: 2013-11-12

## 2013-11-09 NOTE — Telephone Encounter (Signed)
Message copied by Amelia Jo I on Tue Nov 09, 2013 10:50 AM ------      Message from: Amy Leonard      Created: Wed Nov 03, 2013 12:53 PM       Vitamin D level low            Take vitamin D2 50,000 units weekly            Recheck in 6 months with her next MD visit ------

## 2013-11-09 NOTE — Telephone Encounter (Signed)
As noted below by Dr. Humphrey Rolls, I informed the patient that her vitamin D level was low. Please start taking Vitamin D2 50,000/ weekly and recheck level in 6 months with her next MD visit. Patient verbalized understanding.

## 2013-11-12 ENCOUNTER — Other Ambulatory Visit: Payer: Self-pay | Admitting: *Deleted

## 2013-11-12 DIAGNOSIS — C50919 Malignant neoplasm of unspecified site of unspecified female breast: Secondary | ICD-10-CM

## 2013-11-12 MED ORDER — ERGOCALCIFEROL 1.25 MG (50000 UT) PO CAPS: 50000.0000 [IU] | ORAL_CAPSULE | ORAL | Status: DC

## 2013-11-19 LAB — HM DEXA SCAN: HM Dexa Scan: NORMAL

## 2013-11-23 ENCOUNTER — Other Ambulatory Visit: Payer: Self-pay | Admitting: *Deleted

## 2013-11-23 MED ORDER — LOSARTAN POTASSIUM 50 MG PO TABS: 50.0000 mg | ORAL_TABLET | Freq: Every day | ORAL | Status: DC

## 2013-11-30 ENCOUNTER — Encounter (INDEPENDENT_AMBULATORY_CARE_PROVIDER_SITE_OTHER): Payer: Self-pay | Admitting: General Surgery

## 2013-12-09 ENCOUNTER — Telehealth: Payer: Self-pay

## 2013-12-09 NOTE — Telephone Encounter (Signed)
Pt c/o pain on right, sore and tender for last 2-3 weeks.  Can feel knots - unsure if they are scar tissue.  4/15 became very painful.  Per KK - pt needs to be seen.   LMOVM - pt should hear from scheduling soon about appt next week.  POF sent.

## 2013-12-13 ENCOUNTER — Telehealth: Payer: Self-pay | Admitting: Oncology

## 2013-12-13 NOTE — Telephone Encounter (Signed)
due to [place] out and active tx pt moved to [facility] - f/u appt moved from [facility] 4/21 to [facility] 5/6. lmonvm informing pt and mailed new schedule.

## 2013-12-13 NOTE — Telephone Encounter (Signed)
, °

## 2013-12-14 ENCOUNTER — Ambulatory Visit: Payer: BC Managed Care – PPO | Admitting: Adult Health

## 2013-12-20 ENCOUNTER — Telehealth: Payer: Self-pay | Admitting: Hematology and Oncology

## 2013-12-20 NOTE — Telephone Encounter (Signed)
per pt wanted to chge appt from 1:30 to 10-chgd-pt understood

## 2013-12-22 NOTE — Telephone Encounter (Signed)
Open in error

## 2013-12-24 ENCOUNTER — Ambulatory Visit: Payer: Self-pay | Admitting: Radiation Oncology

## 2013-12-29 ENCOUNTER — Ambulatory Visit: Payer: BC Managed Care – PPO

## 2014-01-24 ENCOUNTER — Ambulatory Visit: Payer: Self-pay | Admitting: Radiation Oncology

## 2014-02-02 ENCOUNTER — Encounter: Payer: Self-pay | Admitting: Family Medicine

## 2014-02-02 ENCOUNTER — Ambulatory Visit (INDEPENDENT_AMBULATORY_CARE_PROVIDER_SITE_OTHER): Payer: No Typology Code available for payment source | Admitting: Family Medicine

## 2014-02-02 ENCOUNTER — Ambulatory Visit (INDEPENDENT_AMBULATORY_CARE_PROVIDER_SITE_OTHER)
Admission: RE | Admit: 2014-02-02 | Discharge: 2014-02-02 | Disposition: A | Discharge status: Home / Self Care | Payer: No Typology Code available for payment source | Source: Ambulatory Visit | Attending: Family Medicine | Admitting: Family Medicine

## 2014-02-02 VITALS — BP 102/64 | HR 83 | Temp 98.6°F | Ht 63.0 in | Wt 179.5 lb

## 2014-02-02 DIAGNOSIS — M545 Low back pain, unspecified: Secondary | ICD-10-CM

## 2014-02-02 DIAGNOSIS — R8290 Unspecified abnormal findings in urine: Secondary | ICD-10-CM | POA: Insufficient documentation

## 2014-02-02 DIAGNOSIS — R82998 Other abnormal findings in urine: Secondary | ICD-10-CM

## 2014-02-02 DIAGNOSIS — E7800 Pure hypercholesterolemia, unspecified: Secondary | ICD-10-CM

## 2014-02-02 LAB — POCT URINALYSIS DIPSTICK
Bilirubin, UA: NEGATIVE
Glucose, UA: NEGATIVE
Ketones, UA: NEGATIVE
NITRITE UA: NEGATIVE
Protein, UA: NEGATIVE
UROBILINOGEN UA: 0.2
pH, UA: 6.5

## 2014-02-02 MED ORDER — TRAMADOL HCL 50 MG PO TABS: 50.0000 mg | ORAL_TABLET | Freq: Three times a day (TID) | ORAL | Status: DC | PRN

## 2014-02-02 MED ORDER — ATORVASTATIN CALCIUM 10 MG PO TABS: 10.0000 mg | ORAL_TABLET | Freq: Every day | ORAL | Status: DC

## 2014-02-02 NOTE — Assessment & Plan Note (Signed)
Acute on chronic  In light of recent breast ca - LS film today Refilled tramadol-uses infrequently  She is considering chiropractor or PT - will inform me  Does not feel like she needs to return to the pain clinic at this time

## 2014-02-02 NOTE — Assessment & Plan Note (Signed)
Statin and diet Overdue for check  Disc goals for lipids and reasons to control them Lab today Rev low sat fat diet in detail

## 2014-02-02 NOTE — Progress Notes (Signed)
Pre visit review using our clinic review tool, if applicable. No additional management support is needed unless otherwise documented below in the visit note. 

## 2014-02-02 NOTE — Assessment & Plan Note (Signed)
Hazy with tr prot and wbc  Doubt cause of back pain but will cx inst to drink more water

## 2014-02-02 NOTE — Patient Instructions (Addendum)
Xray of back today- we will update you with result tomorrow  Take tramadol for back pain as needed -only take for more severe pain  Avoid red meat/ fried foods/ egg yolks/ fatty breakfast meats/ butter, cheese and high fat dairy/ and shellfish  -for cholesterol  Labs for cholesterol today  We will culture your urine -drink more water and we will call you with a result
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
## 2014-02-03 LAB — LIPID PANEL
Cholesterol: 165 mg/dL (ref 0–200)
HDL: 47.3 mg/dL (ref >39.00)
LDL Cholesterol: 100 mg/dL — ABNORMAL HIGH (ref 0–99)
NONHDL: 117.7
TRIGLYCERIDES: 91 mg/dL (ref 0.0–149.0)
Total CHOL/HDL Ratio: 3
VLDL: 18.2 mg/dL (ref 0.0–40.0)

## 2014-02-03 LAB — ALT: ALT: 23 U/L (ref 0–35)

## 2014-02-03 LAB — AST: AST: 20 U/L (ref 0–37)

## 2014-02-04 ENCOUNTER — Telehealth: Payer: Self-pay | Admitting: Family Medicine

## 2014-02-04 DIAGNOSIS — M51369 Other intervertebral disc degeneration, lumbar region without mention of lumbar back pain or lower extremity pain: Secondary | ICD-10-CM | POA: Insufficient documentation

## 2014-02-04 DIAGNOSIS — M5136 Other intervertebral disc degeneration, lumbar region: Secondary | ICD-10-CM

## 2014-02-04 DIAGNOSIS — M4310 Spondylolisthesis, site unspecified: Secondary | ICD-10-CM | POA: Insufficient documentation

## 2014-02-04 LAB — URINE CULTURE
COLONY COUNT: NO GROWTH
Organism ID, Bacteria: NO GROWTH

## 2014-02-04 NOTE — Telephone Encounter (Signed)
Message copied by Abner Greenspan on Fri Feb 04, 2014 10:11 AM ------      Message from: Tammi Sou      Created: Fri Feb 04, 2014  8:59 AM       Pt notified of Xray results and Dr. Marliss Coots comments. Pt does want to see an orthopedic doc in [place] if possible. Please put referral in, I advise pt that Marion/Linda/Helen will call to schedule appt ------

## 2014-02-04 NOTE — Telephone Encounter (Signed)
Ref to ortho

## 2014-03-10 ENCOUNTER — Other Ambulatory Visit: Payer: Self-pay | Admitting: *Deleted

## 2014-03-10 ENCOUNTER — Encounter: Payer: Self-pay | Admitting: Gastroenterology

## 2014-03-10 MED ORDER — ATORVASTATIN CALCIUM 10 MG PO TABS: 10.0000 mg | ORAL_TABLET | Freq: Every day | ORAL | Status: DC

## 2014-03-10 MED ORDER — LOSARTAN POTASSIUM 50 MG PO TABS: 50.0000 mg | ORAL_TABLET | Freq: Every day | ORAL | Status: DC

## 2014-03-23 ENCOUNTER — Telehealth: Payer: Self-pay | Admitting: Gastroenterology

## 2014-03-23 DIAGNOSIS — K2100 Gastro-esophageal reflux disease with esophagitis, without bleeding: Secondary | ICD-10-CM

## 2014-03-23 DIAGNOSIS — Z1211 Encounter for screening for malignant neoplasm of colon: Secondary | ICD-10-CM

## 2014-03-23 DIAGNOSIS — R1319 Other dysphagia: Secondary | ICD-10-CM

## 2014-03-23 DIAGNOSIS — D126 Benign neoplasm of colon, unspecified: Secondary | ICD-10-CM

## 2014-03-23 DIAGNOSIS — R1310 Dysphagia, unspecified: Secondary | ICD-10-CM

## 2014-03-23 MED ORDER — OMEPRAZOLE 40 MG PO CPDR: 40.0000 mg | DELAYED_RELEASE_CAPSULE | Freq: Two times a day (BID) | ORAL | Status: DC

## 2014-03-23 NOTE — Telephone Encounter (Signed)
Informed patient she is over due for her recall Endoscopy. We sent the letter in February and again in July stating she is over due. Patient states she cannot schedule at this time due to insurance but she can come into the office for a follow up visit. Told patient that we can only give one refill until appt with Dr. Fuller Plan. Attempted to schedule office visit with patient but she states she does not know her work schedule and will call me back when she gets to work. Told patient I will send one refill but she has to call back to either schedule her office visit or Upper Endoscopy. Pt agreed and wants prescription sent to [facility].

## 2014-03-23 NOTE — Telephone Encounter (Signed)
Left a message for patient to return my call. 

## 2014-03-24 ENCOUNTER — Telehealth: Payer: Self-pay | Admitting: Family Medicine

## 2014-03-24 NOTE — Telephone Encounter (Signed)
Pt dropped off insurance forms Gave to shapale

## 2014-03-24 NOTE — Telephone Encounter (Signed)
Forms in your inbox

## 2014-03-25 NOTE — Telephone Encounter (Signed)
Done and in IN box 

## 2014-03-28 ENCOUNTER — Encounter: Payer: Self-pay | Admitting: Oncology

## 2014-03-28 NOTE — Progress Notes (Signed)
Faxed anastrazole pa form to [facility]

## 2014-03-28 NOTE — Telephone Encounter (Signed)
Left voicemail requesting pt to call office back 

## 2014-03-30 ENCOUNTER — Encounter: Payer: Self-pay | Admitting: Oncology

## 2014-03-30 NOTE — Progress Notes (Signed)
[employer] approved anastrozole from 03/29/14-03/29/17

## 2014-03-31 NOTE — Telephone Encounter (Signed)
Pt will come in tomorrow to get a waist measurement for her form and I will give pt a copy then

## 2014-03-31 NOTE — Telephone Encounter (Signed)
Left voicemail requesting pt to call office 

## 2014-03-31 NOTE — Telephone Encounter (Signed)
Patient returned your call.  Call her back on her cell phone [phone].  She's on her lunch until 2:30 and she asked for you to call her back before 2:30.

## 2014-04-01 NOTE — Telephone Encounter (Signed)
Form faxed and pt was given a copy

## 2014-04-15 NOTE — Progress Notes (Signed)
CALLED [facility] AND TOLD THEM THAT THIS HAS BEEN APPROVED.

## 2014-04-20 ENCOUNTER — Ambulatory Visit: Payer: No Typology Code available for payment source | Admitting: Gastroenterology

## 2014-05-05 ENCOUNTER — Ambulatory Visit: Payer: BC Managed Care – PPO | Admitting: Oncology

## 2014-05-05 ENCOUNTER — Other Ambulatory Visit: Payer: BC Managed Care – PPO

## 2014-05-10 ENCOUNTER — Encounter: Payer: Self-pay | Admitting: Gastroenterology

## 2014-05-10 ENCOUNTER — Ambulatory Visit (INDEPENDENT_AMBULATORY_CARE_PROVIDER_SITE_OTHER): Payer: No Typology Code available for payment source | Admitting: Gastroenterology

## 2014-05-10 VITALS — BP 122/82 | HR 84 | Ht 63.5 in | Wt 175.0 lb

## 2014-05-10 DIAGNOSIS — K2100 Gastro-esophageal reflux disease with esophagitis, without bleeding: Secondary | ICD-10-CM

## 2014-05-10 MED ORDER — OMEPRAZOLE 40 MG PO CPDR: 40.0000 mg | DELAYED_RELEASE_CAPSULE | Freq: Every day | ORAL | Status: DC

## 2014-05-10 NOTE — Progress Notes (Signed)
    History of Present Illness: This is a 53 year old female with LA classification grade D erosive esophagitis diagnosed in February 2014. She decreased omeprazole to daily from twice daily and states her reflux symptoms remain under. Controlled except certain foods tend to lead to breakthrough symptoms. She takes Zantac as needed for breakthrough symptoms. She declined to return for followup for upper endoscopy to insurance coverage reasons and due to treatment for breast cancer.  Current Medications, Allergies, Past Medical History, Past Surgical History, Family History and Social History were reviewed in [employer] record.  Physical Exam: General: Well developed , well nourished, no acute distress Head: Normocephalic and atraumatic Eyes:  sclerae anicteric, EOMI Ears: Normal auditory acuity Mouth: No deformity or lesions Lungs: Clear throughout to auscultation Heart: Regular rate and rhythm; no murmurs, rubs or bruits Abdomen: Soft, non tender and non distended. No masses, hepatosplenomegaly or hernias noted. Normal Bowel sounds Musculoskeletal: Symmetrical with no gross deformities  Pulses:  Normal pulses noted Extremities: No clubbing, cyanosis, edema or deformities noted Neurological: Alert oriented x 4, grossly nonfocal Psychological:  Alert and cooperative. Normal mood and affect  Assessment and Recommendations:  1. LA classification grade D erosive esophagitis diagnosed in February 2014. Follow up office visit and EGD were recommended to document healing and to evaluate for underlying Barrett's however she has not returned for recommended followup. Continue all standard antireflux measures. Continue omeprazole 40 mg daily and ranitidine 150 mg daily as needed. Upper endoscopy was again recommended that her insurance coverage she states she cannot schedule this time and will call back to schedule. REV in 1 year.

## 2014-05-10 NOTE — Patient Instructions (Addendum)
We have sent the following medications to your pharmacy for you to pick up at your convenience: Omeprazole.  It has been recommended to you by your physician that you have a(n) Endoscopy completed. Per your request, we did not schedule the procedure(s) today. Please contact our office at [phone] should you decide to have the procedure completed.  Thank you for choosing me and [facility].  Pricilla Riffle. Dagoberto Ligas., MD., [facility]

## 2014-06-17 ENCOUNTER — Ambulatory Visit: Payer: Self-pay | Admitting: Oncology

## 2014-07-20 ENCOUNTER — Ambulatory Visit: Payer: Self-pay | Admitting: Oncology

## 2014-07-25 LAB — CBC CANCER CENTER
BASOS ABS: 0 x10 3/mm (ref 0.0–0.1)
BASOS PCT: 0.4 %
EOS ABS: 0.1 x10 3/mm (ref 0.0–0.7)
Eosinophil %: 1.2 %
HCT: 41.6 % (ref 35.0–47.0)
HGB: 13.9 g/dL (ref 12.0–16.0)
LYMPHS ABS: 3.6 x10 3/mm (ref 1.0–3.6)
LYMPHS PCT: 33.2 %
MCH: 34.1 pg — AB (ref 26.0–34.0)
MCHC: 33.4 g/dL (ref 32.0–36.0)
MCV: 102 fL — ABNORMAL HIGH (ref 80–100)
MONO ABS: 0.7 x10 3/mm (ref 0.2–0.9)
Monocyte %: 6.6 %
NEUTROS ABS: 6.4 x10 3/mm (ref 1.4–6.5)
NEUTROS PCT: 58.6 %
PLATELETS: 245 x10 3/mm (ref 150–440)
RBC: 4.08 10*6/uL (ref 3.80–5.20)
RDW: 13.2 % (ref 11.5–14.5)
WBC: 11 x10 3/mm (ref 3.6–11.0)

## 2014-07-25 LAB — COMPREHENSIVE METABOLIC PANEL
ALBUMIN: 3.5 g/dL (ref 3.4–5.0)
AST: 16 U/L (ref 15–37)
Alkaline Phosphatase: 75 U/L
Anion Gap: 5 — ABNORMAL LOW (ref 7–16)
BUN: 15 mg/dL (ref 7–18)
Bilirubin,Total: 0.3 mg/dL (ref 0.2–1.0)
CALCIUM: 8.6 mg/dL (ref 8.5–10.1)
Chloride: 105 mmol/L (ref 98–107)
Co2: 30 mmol/L (ref 21–32)
Creatinine: 0.74 mg/dL (ref 0.60–1.30)
Glucose: 97 mg/dL (ref 65–99)
OSMOLALITY: 280 (ref 275–301)
Potassium: 4.1 mmol/L (ref 3.5–5.1)
SGPT (ALT): 24 U/L
SODIUM: 140 mmol/L (ref 136–145)
Total Protein: 6.4 g/dL (ref 6.4–8.2)

## 2014-07-26 ENCOUNTER — Ambulatory Visit: Payer: Self-pay | Admitting: Oncology

## 2014-07-26 LAB — CANCER ANTIGEN 27.29: CA 27.29: 17 U/mL (ref 0.0–38.6)

## 2014-07-27 ENCOUNTER — Other Ambulatory Visit: Payer: Self-pay

## 2014-07-27 MED ORDER — LOSARTAN POTASSIUM 50 MG PO TABS: 50.0000 mg | ORAL_TABLET | Freq: Every day | ORAL | Status: DC

## 2014-07-27 NOTE — Telephone Encounter (Signed)
Pt left v/m requesting refill losartan to [facility]. Pt has been out of med for 1 week and pt last seen 02/02/14 for back pain.Please advise.

## 2014-07-27 NOTE — Telephone Encounter (Signed)
Please refill for 6 months 

## 2014-08-18 ENCOUNTER — Other Ambulatory Visit: Payer: Self-pay | Admitting: Family Medicine

## 2014-08-18 NOTE — Telephone Encounter (Signed)
Please schedule f/u or PE in 6 mo and refill until then

## 2014-08-18 NOTE — Telephone Encounter (Signed)
appt scheduled and med refilled 

## 2014-08-18 NOTE — Telephone Encounter (Signed)
Electronic refill request, no recent/future appt., please advise  

## 2014-08-26 ENCOUNTER — Ambulatory Visit: Payer: Self-pay | Admitting: Oncology

## 2014-10-24 ENCOUNTER — Telehealth: Payer: Self-pay | Admitting: Gastroenterology

## 2014-10-24 MED ORDER — OMEPRAZOLE 40 MG PO CPDR: 40.0000 mg | DELAYED_RELEASE_CAPSULE | Freq: Every day | ORAL | Status: DC

## 2014-10-24 NOTE — Telephone Encounter (Signed)
Prescription sent to patient's pharmacy.

## 2014-12-16 NOTE — Consult Note (Signed)
Reason for Visit: This 54 year old Female patient presents to the clinic for initial evaluation of  bilateral breast cancer .   Referred by Dr. Kalsoom Khan.  Diagnosis:  Chief Complaint/Diagnosis   54-year-old female with bilateral breast cancer status post breast wide local excision and axillary node dissection for a T1 C. 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 N1 MI M0) ER/PR positive invasive memory carcinoma status post adjuvant chemotherapy.  Pathology Report pathology report reviewed   Imaging Report mammograms ultrasound and MRI requested for my review   Referral Report clinical notes reviewed   Planned Treatment Regimen bilateral whole breast radiation with left breast receiving peripheral lymphatic radiation in addition   HPI   patient is a pleasant 54-year-old female who was found to have bilateral abnormal mammograms on screening mammography. Right breast showed a 2 cm mass at the 10:00 position confirmed on ultrasound. Left breast showed a 5 x 8 mm lesion at the 8:00 position also confirmed on ultrasound. Both masses were positive on core biopsy and patient subsequently underwent bilateral lumpectomies Right breast was invasive mammary carcinoma triple -11 with those were dissected all negative for metastatic disease. Tumor was 1.5 cm grade 3 overall. Margins were clear although DCIS was close margin at 1 mm. Left breast showed a 1 cm grade 3 invasive ductal carcinoma ER/PR positive HER-2/neu not overexpressed. One sentinel lymph node was positive for a micrometastatic focus with extracapsular extension. Lesion was less than 2 mm in the sentinel node. Posterior margin was positive reexcision margins were negative. There was positive lymphovascular invasion. Patient is undergone 4 cycles is soft Cytoxan Adriamycin for 6 cycles of Taxol carboplatinum which was discontinued secondary  to peripheral neuropathy she is now is scheduled for her third cycle of carbo gem. Aside from her peripheral neuropathy in her hands and feet she is tolerated her treatments well. I been asked to evaluate the patient for adjuvant radiation therapy. She specifically denies breast tenderness cough or bone pain.  Past Hx:    Hyperlipidemia:    Hx Migraines:    Spinal Stenosis:    Back Pain:    GERD:    Hypertension:    Bronchitis:    Bilateral Breast Cancer:   Past, Family and Social History:  Past Medical History positive   Cardiovascular hyperlipidemia; hypertension   Respiratory bronchitis   Gastrointestinal GERD   Neurological/Psychiatric anxiety; depression; migraine; spinal stenosis   Past Surgical History bilateral breast surgeries described above also port placement and right anterior chest   Family History noncontributory   Social History positive   Social History Comments 40-pack-year smoking history   Additional Past Medical and Surgical History accompanied by her daughter today   Allergies:   Wellbutrin: GI Distress  chlorhexidine topical: Rash, Itching  Naproxen Sodium: GI Distress  Hydrocodone: Other  Lisinopril: Other  Home Meds:  Home Medications: Medication Instructions Status  aspirin 81 mg oral tablet 1 tab(s) orally once a day Active  atorvastatin 10 mg oral tablet 1 tab(s) orally once a day (at bedtime) Active  Ativan 0.5 mg oral tablet 1 tab(s) orally every 6 hours, As Needed - for Nausea, Vomiting Active  Emla 2.5%-2.5% topical cream Apply topically to affected area once, As Needed Active  losartan 50 mg oral tablet 1 tab(s) orally once a day Active  Mycostatin 5 milliliter(s) orally 2 times a day Active  Tobradex   0.1%-0.3% ophthalmic suspension 1 drop(s) to both eyes every 4 hours while awake. Active  Tylenol Arthritis Caplet 650 mg oral tablet, extended release 2 tab(s) orally every 8 hours, As Needed - for Pain Active  Zovirax Topical  5% topical cream Apply topically to affected area 5 times a day, As Needed Active  Tums 500 500 mg oral tablet, chewable  orally as directed as needed. Active  Cipro 500 mg oral tablet 1 tab(s) orally every 12 hours, take when instructed to by MD. Active  simethicone 80 mg oral tablet, chewable 1 tab(s) orally 4 times a day (after meals and at bedtime), As Needed Active  PriLOSEC 40 mg oral delayed release capsule 1 cap(s) orally 2 times a day Active   Review of Systems:  General negative   Performance Status (ECOG) 0   Skin negative   Breast see HPI   Ophthalmologic negative   ENMT negative   Respiratory and Thorax negative   Cardiovascular negative   Gastrointestinal negative   Genitourinary negative   Musculoskeletal negative   Neurological negative   Psychiatric negative   Hematology/Lymphatics negative   Endocrine negative   Allergic/Immunologic negative   Nursing Notes:  Nursing Vital Signs and Chemo Nursing Nursing Notes: *CC Vital Signs Flowsheet:   24-Sep-14 14:35  Temp Temperature 97.9  Pulse Pulse 102  Respirations Respirations 20  SBP SBP 177  DBP DBP 92  Pain Scale (0-10)  0  Current Weight (kg) (kg) 87.8  Height (cm) centimeters 160.4  BSA (m2) 1.9   Physical Exam:  General/Skin/HEENT:  General normal   Skin normal   Eyes normal   ENMT normal   Head and Neck normal   Additional PE well-developed obese female in NAD. Lungs are clear to A&P cardiac examination shows regular rate and rhythm. She is status post bilateral lumpectomies incisions are well-healed. No dominant mass or nodularity is noted in either breast into position examined. No axillary or supraclavicular adenopathy is appreciated. No evidence of upper extremity lymphedema is noted.   Breasts/Resp/CV/GI/GU:  Respiratory and Thorax normal   Cardiovascular normal   Gastrointestinal normal   Genitourinary normal   MS/Neuro/Psych/Lymph:  Musculoskeletal normal    Neurological normal   Lymphatics normal   Relevent Results:   Relevant Scans and Labs MRI scans mammograms and ultrasounds were all requested for my review from:[facility]   Assessment and Plan: Impression:   bilateral breast cancer as described above right breast being a stage I (T1 C. N0 M0) triple-negative lesion left breast being a stage I (T1 B. N1 MI M0) ER/PR positive invasive mammary carcinoma status post adjuvant chemotherapy Plan:   at this time I like to go ahead and adjuvant radiation therapy bilaterally. Based on her pathology would treat her right breast to 5000 cGy over 5 weeks boosting or scar another 1400 cGy using electron beam. Left breast being only one sentinel node removed and had positive pathology was a micrometastatic focus and extracapsular extension would treat her left breast to 5000 cGy as well as her peripheral lymphatics. Would also boost or scar another 1400 cGy using electron beam. Risks and benefits of treatment including skin reaction, fatigue, inclusion of some superficial lung possibly, and possibility of left upper extremity lymphedema secondary to peripheral lymphatic radiation were all explained in detail to the patient and her daughter. Both seem to comprehend my treatment plan well. Will allow her to finish her final cycle of chemotherapy have set her up for CT simulation in about   2 weeks' time. Patient will also be a candidate for aromatase inhibitor after completion of radiation which is already been documented in Dr. cons node.  I would like to take this opportunity to thank you for allowing me to continue to participate in this patient's care.  CC Referral:  cc: Dr. Kalsoom Khan, Dr. Tower   Electronic Signatures: Chrystal, Glenn S (MD)  (Signed 24-Sep-14 15:38)  Authored: HPI, Diagnosis, Past Hx, PFSH, Allergies, Home Meds, ROS, Nursing Notes, Physical Exam, Relevent Results, Encounter Assessment and Plan, CC Referring Physician   Last  Updated: 24-Sep-14 15:38 by Chrystal, Glenn S (MD) 

## 2014-12-19 ENCOUNTER — Ambulatory Visit
Admit: 2014-12-19 | Disposition: A | Discharge status: Home / Self Care | Payer: Self-pay | Attending: Oncology | Admitting: Oncology

## 2015-01-10 ENCOUNTER — Other Ambulatory Visit: Payer: Self-pay

## 2015-01-10 ENCOUNTER — Other Ambulatory Visit: Payer: Self-pay | Admitting: *Deleted

## 2015-01-10 MED ORDER — OMEPRAZOLE 40 MG PO CPDR: 40.0000 mg | DELAYED_RELEASE_CAPSULE | Freq: Every day | ORAL | Status: DC

## 2015-01-10 MED ORDER — LOSARTAN POTASSIUM 50 MG PO TABS: 50.0000 mg | ORAL_TABLET | Freq: Every day | ORAL | Status: DC

## 2015-01-10 MED ORDER — ATORVASTATIN CALCIUM 10 MG PO TABS: 10.0000 mg | ORAL_TABLET | Freq: Every day | ORAL | Status: DC

## 2015-01-13 ENCOUNTER — Other Ambulatory Visit: Payer: Self-pay | Admitting: *Deleted

## 2015-01-13 MED ORDER — ASPIRIN 81 MG PO CHEW: 81.0000 mg | CHEWABLE_TABLET | Freq: Every day | ORAL | Status: DC

## 2015-01-13 NOTE — Telephone Encounter (Signed)
done

## 2015-01-13 NOTE — Telephone Encounter (Signed)
Please refill for 3 mo  I will see her in June as planned

## 2015-01-13 NOTE — Telephone Encounter (Signed)
Fax refill request, I don't think we have prescribed ASA before, please advise

## 2015-01-20 ENCOUNTER — Other Ambulatory Visit: Payer: Self-pay | Admitting: *Deleted

## 2015-01-20 DIAGNOSIS — C50919 Malignant neoplasm of unspecified site of unspecified female breast: Secondary | ICD-10-CM

## 2015-01-24 ENCOUNTER — Other Ambulatory Visit: Payer: No Typology Code available for payment source

## 2015-01-24 ENCOUNTER — Ambulatory Visit: Payer: No Typology Code available for payment source | Admitting: Oncology

## 2015-01-26 ENCOUNTER — Other Ambulatory Visit: Payer: No Typology Code available for payment source

## 2015-01-26 ENCOUNTER — Ambulatory Visit: Payer: No Typology Code available for payment source | Admitting: Oncology

## 2015-01-31 ENCOUNTER — Inpatient Hospital Stay (HOSPITAL_BASED_OUTPATIENT_CLINIC_OR_DEPARTMENT_OTHER): Payer: BLUE CROSS/BLUE SHIELD | Admitting: Oncology

## 2015-01-31 ENCOUNTER — Inpatient Hospital Stay: Payer: BLUE CROSS/BLUE SHIELD | Attending: Oncology

## 2015-01-31 ENCOUNTER — Encounter: Payer: Self-pay | Admitting: Oncology

## 2015-01-31 VITALS — BP 112/74 | HR 61 | Temp 96.0°F | Wt 169.8 lb

## 2015-01-31 DIAGNOSIS — E785 Hyperlipidemia, unspecified: Secondary | ICD-10-CM | POA: Diagnosis not present

## 2015-01-31 DIAGNOSIS — Z7982 Long term (current) use of aspirin: Secondary | ICD-10-CM | POA: Insufficient documentation

## 2015-01-31 DIAGNOSIS — Z8673 Personal history of transient ischemic attack (TIA), and cerebral infarction without residual deficits: Secondary | ICD-10-CM | POA: Diagnosis not present

## 2015-01-31 DIAGNOSIS — C50919 Malignant neoplasm of unspecified site of unspecified female breast: Secondary | ICD-10-CM

## 2015-01-31 DIAGNOSIS — Z170 Estrogen receptor positive status [ER+]: Secondary | ICD-10-CM | POA: Insufficient documentation

## 2015-01-31 DIAGNOSIS — C50912 Malignant neoplasm of unspecified site of left female breast: Secondary | ICD-10-CM | POA: Diagnosis not present

## 2015-01-31 DIAGNOSIS — F329 Major depressive disorder, single episode, unspecified: Secondary | ICD-10-CM | POA: Insufficient documentation

## 2015-01-31 DIAGNOSIS — Z8669 Personal history of other diseases of the nervous system and sense organs: Secondary | ICD-10-CM | POA: Diagnosis not present

## 2015-01-31 DIAGNOSIS — K219 Gastro-esophageal reflux disease without esophagitis: Secondary | ICD-10-CM

## 2015-01-31 DIAGNOSIS — F419 Anxiety disorder, unspecified: Secondary | ICD-10-CM | POA: Diagnosis not present

## 2015-01-31 DIAGNOSIS — I10 Essential (primary) hypertension: Secondary | ICD-10-CM

## 2015-01-31 DIAGNOSIS — Z9221 Personal history of antineoplastic chemotherapy: Secondary | ICD-10-CM | POA: Insufficient documentation

## 2015-01-31 DIAGNOSIS — Z79811 Long term (current) use of aromatase inhibitors: Secondary | ICD-10-CM | POA: Diagnosis not present

## 2015-01-31 DIAGNOSIS — Z923 Personal history of irradiation: Secondary | ICD-10-CM

## 2015-01-31 DIAGNOSIS — Z171 Estrogen receptor negative status [ER-]: Secondary | ICD-10-CM | POA: Insufficient documentation

## 2015-01-31 DIAGNOSIS — Z9013 Acquired absence of bilateral breasts and nipples: Secondary | ICD-10-CM | POA: Diagnosis not present

## 2015-01-31 DIAGNOSIS — F17210 Nicotine dependence, cigarettes, uncomplicated: Secondary | ICD-10-CM | POA: Diagnosis not present

## 2015-01-31 DIAGNOSIS — C50911 Malignant neoplasm of unspecified site of right female breast: Secondary | ICD-10-CM

## 2015-01-31 DIAGNOSIS — Z87891 Personal history of nicotine dependence: Secondary | ICD-10-CM | POA: Diagnosis not present

## 2015-01-31 DIAGNOSIS — Z806 Family history of leukemia: Secondary | ICD-10-CM | POA: Diagnosis not present

## 2015-01-31 DIAGNOSIS — M4800 Spinal stenosis, site unspecified: Secondary | ICD-10-CM | POA: Insufficient documentation

## 2015-01-31 DIAGNOSIS — Z79899 Other long term (current) drug therapy: Secondary | ICD-10-CM | POA: Diagnosis not present

## 2015-01-31 LAB — CBC WITH DIFFERENTIAL/PLATELET
Basophils Absolute: 0 10*3/uL (ref 0–0.1)
Basophils Relative: 1 %
EOS PCT: 1 %
Eosinophils Absolute: 0.1 10*3/uL (ref 0–0.7)
HCT: 39.4 % (ref 35.0–47.0)
HEMOGLOBIN: 13.1 g/dL (ref 12.0–16.0)
LYMPHS PCT: 28 %
Lymphs Abs: 2.5 10*3/uL (ref 1.0–3.6)
MCH: 32.7 pg (ref 26.0–34.0)
MCHC: 33.2 g/dL (ref 32.0–36.0)
MCV: 98.6 fL (ref 80.0–100.0)
MONOS PCT: 8 %
Monocytes Absolute: 0.7 10*3/uL (ref 0.2–0.9)
NEUTROS ABS: 5.5 10*3/uL (ref 1.4–6.5)
NEUTROS PCT: 62 %
Platelets: 216 10*3/uL (ref 150–440)
RBC: 4 MIL/uL (ref 3.80–5.20)
RDW: 13 % (ref 11.5–14.5)
WBC: 8.8 10*3/uL (ref 3.6–11.0)

## 2015-01-31 LAB — COMPREHENSIVE METABOLIC PANEL
ALBUMIN: 3.5 g/dL (ref 3.5–5.0)
ALT: 17 U/L (ref 14–54)
ANION GAP: 5 (ref 5–15)
AST: 17 U/L (ref 15–41)
Alkaline Phosphatase: 62 U/L (ref 38–126)
BILIRUBIN TOTAL: 0.6 mg/dL (ref 0.3–1.2)
BUN: 16 mg/dL (ref 6–20)
CO2: 28 mmol/L (ref 22–32)
Calcium: 8.3 mg/dL — ABNORMAL LOW (ref 8.9–10.3)
Chloride: 105 mmol/L (ref 101–111)
Creatinine, Ser: 0.63 mg/dL (ref 0.44–1.00)
GFR calc non Af Amer: >60 mL/min (ref >60)
GLUCOSE: 93 mg/dL (ref 65–99)
POTASSIUM: 3.7 mmol/L (ref 3.5–5.1)
Sodium: 138 mmol/L (ref 135–145)
TOTAL PROTEIN: 6 g/dL — AB (ref 6.5–8.1)

## 2015-01-31 MED ORDER — PREGABALIN 75 MG PO CAPS: 75.0000 mg | ORAL_CAPSULE | Freq: Every day | ORAL | Status: DC

## 2015-01-31 NOTE — Progress Notes (Signed)
[place] @ [facility] Telephone:([phone]  Fax:(336) [place]: 1961-08-14  MR#: [id number]  NTI#:[id number]  Patient Care Team: Abner Greenspan, MD as PCP - General  CHIEF COMPLAINT:  Chief Complaint  Patient presents with  . Follow-up    Oncology History   Chief Complaint/Diagnosis:   54 year old female with bilateral breast cancer status post  Right breast wide local excision and axillary node dissection for a T1 C. N0 M0 stage I triple negative invasive ductal carcinoma  Left breast status post wide local excision and sentinel node biopsy for a stage I (T1 B. N1 MI M0) ER/PR positive invasive memory carcinoma status post adjuvant chemotherapy at outside institution patient received Cytoxan Adriamycin x4 followed by Taxol which was later on  carboplatin and gemcitabine. patient has finished bilateral radiation  therapy.  started on anastrozole 1 mg by mouth daily since March of 201     Cancer of breast   01/31/2015 Initial Diagnosis Cancer of breast    Oncology Flowsheet 04/28/2013 04/29/2013 05/12/2013 05/13/2013 05/26/2013 05/27/2013 08/25/2013  Day, Cycle Day 1, Cycle 1 Day 2, Cycle 1 Day 1, Cycle 2 Day 2, Cycle 2 Day 1, Cycle 3 Day 2, Cycle 3 -  CARBOplatin (PARAPLATIN) IV 300 mg - 300 mg - 300 mg - -  cyclophosphamide (CYTOXAN) IV - - - - - - -  dexamethasone (DECADRON) IJ - - - - - - -  Dexamethasone Sodium Phosphate (DECADRON) IV 10 mg - 10 mg - 10 mg - -  diazepam (VALIUM) PO - - - - - - -  DOXOrubicin (ADRIAMYCIN) IV - - - - - - -  filgrastim (NEUPOGEN) [place] - - - - - - -  fosaprepitant (EMEND) IV - - - - - - -  gemcitabine (GEMZAR) IV 800 mg/m2 - 800 mg/m2 - 800 mg/m2 - -  LORazepam (ATIVAN) IV - - - - - - -  LORazepam (ATIVAN) PO - - - - - - -  ondansetron (ZOFRAN) IJ - - - - - - -  ondansetron (ZOFRAN) IV 8 mg - 8 mg - 8 mg - -  ondansetron (ZOFRAN) IV - - - - - - -  ondansetron (ZOFRAN) IV - - - - - - -  PACLitaxel (TAXOL) IV - - - - - - -    palonosetron (ALOXI) IV - - - - - - -  pegfilgrastim (NEULASTA) [place] - 6 mg - 6 mg - 6 mg -    INTERVAL HISTORY:  54 year old lady with bilateral carcinoma of breasts here for further follow-up and treatment consideration.  Taking anastrozole tolerating very well taking calcium and vitamin D had a bone density study and mammogram No bony pain no bony fractures He is to smoke REVIEW OF SYSTEMS:   GENERAL:  Feels good.  Active.  No fevers, sweats or weight loss. PERFORMANCE STATUS (ECOG01 HEENT:  No visual changes, runny nose, sore throat, mouth sores or tenderness. Lungs: No shortness of breath or cough.  No hemoptysis. Cardiac:  No chest pain, palpitations, orthopnea, or PND. GI:  No nausea, vomiting, diarrhea, constipation, melena or hematochezia. GU:  No urgency, frequency, dysuria, or hematuria. Musculoskeletal:  No back pain.  No joint pain.  No muscle tenderness. Extremities:  No pain or swelling. Skin:  No rashes or skin changes. Neuro:  No headache, numbness or weakness, balance or coordination issues. Endocrine:  No diabetes, thyroid issues, hot flashes or night sweats. Psych:  No mood changes, depression or anxiety. Pain:  No focal pain. Review of systems:  All other systems reviewed and found to be negative. As per HPI. Otherwise, a complete review of systems is negatve.  PAST MEDICAL HISTORY: Past Medical History  Diagnosis Date  . Elevated WBC count     nl diff  . Chronic back pain     spinal stenosis  . Tobacco abuse   . Anxiety and depression   . HSV infection     recurrent (side and buttox)  . Migraine   . Glaucoma     ?  . Carpal tunnel syndrome   . HLD (hyperlipidemia)   . Folliculitis 11/30/960  . Anxiety   . Depression   . 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 GERD (gastroesophageal reflux disease)   . Hypertension   . Breast cancer   . Wears dentures     top  . Erosive gastritis   . Barrett's esophageal ulceration   . Cancer of breast 01/31/2015     PAST SURGICAL HISTORY: Past Surgical History  Procedure Laterality Date  . Btl    . Epidural steroid injection  2008  . Colonoscopy    . Upper gi endoscopy    . Partial mastectomy with needle localization and axillary sentinel lymph node bx Bilateral 11/23/2012    Procedure: PARTIAL MASTECTOMY WITH NEEDLE LOCALIZATION AND AXILLARY SENTINEL LYMPH NODE BX;  Surgeon: Adin Hector, MD;  Location: [place];  Service: General;  Laterality: Bilateral;  . Portacath placement Right 11/23/2012    Procedure: INSERTION PORT-A-CATH;  Surgeon: Adin Hector, MD;  Location: [place];  Service: General;  Laterality: Right;  . Port-a-cath removal Right 08/25/2013    Procedure: REMOVAL PORT-A-CATH;  Surgeon: Adin Hector, MD;  Location: [place];  Service: General;  Laterality: Right;    FAMILY HISTORY Family History  Problem Relation Age of Onset  . Stroke Maternal Grandmother   . Diabetes Maternal Grandmother   . Diabetes Mother   . Hypertension Mother   . Leukemia Mother   . Obesity Mother   . Depression Sister   . Bipolar disorder Son     Bipolar / oppositional defiant  . Alcohol abuse Sister   . Drug abuse Sister   . Alcohol abuse Sister   . Drug abuse Sister   . Colon cancer Neg Hx   . Alcohol abuse Brother   . Drug abuse Brother     ADVANCED DIRECTIVES:  No flowsheet data found.  HEALTH MAINTENANCE: History  Substance Use Topics  . Smoking status: Current Every Day Smoker -- 0.50 packs/day for 33 years    Types: Cigarettes  . Smokeless tobacco: Never Used     Comment: 1 1/2 ppd -form given 10-07-12  . Alcohol Use: Yes     Comment: beer rarely      Allergies  Allergen Reactions  . Bupropion Hcl Nausea Only and Other (See Comments)     GI, sleepy  . Chlorhexidine Gluconate Itching and Rash  . Hydrocod Polst-Cpm Polst Er Nausea And Vomiting    vomiting  . Lisinopril Other (See Comments)    Leg cramps    .  Naproxen Sodium Other (See Comments)    does not tolerate    Current Outpatient Prescriptions  Medication Sig Dispense Refill  . Acetaminophen (TYLENOL ARTHRITIS PAIN PO) Take 2 tablets by mouth as needed (pain).     Marland Kitchen acyclovir ointment (ZOVIRAX) 5 % Apply  1 application topically as needed (flare-up). 15 g 1  . anastrozole (ARIMIDEX) 1 MG tablet Take 1 tablet (1 mg total) by mouth daily. 90 tablet 12  . aspirin 81 MG chewable tablet Chew 1 tablet (81 mg total) by mouth daily. 90 tablet 0  . atorvastatin (LIPITOR) 10 MG tablet Take 1 tablet (10 mg total) by mouth daily. 90 tablet 0  . b complex vitamins tablet Take 1 tablet by mouth daily.    . calcium carbonate (TUMS - DOSED IN MG ELEMENTAL CALCIUM) 500 MG chewable tablet Chew by mouth as directed.      . Calcium Carbonate-Vitamin D (CALCIUM + D PO) Take 1 tablet by mouth daily.    Marland Kitchen losartan (COZAAR) 50 MG tablet Take 1 tablet (50 mg total) by mouth daily. 90 tablet 0  . nicotine (NICODERM CQ - DOSED IN MG/24 HOURS) 21 mg/24hr patch Place 1 patch onto the skin daily. 28 patch 0  . omeprazole (PRILOSEC) 40 MG capsule Take 1 capsule (40 mg total) by mouth daily. 90 capsule 0  . ranitidine (ZANTAC) 300 MG tablet Take 150 mg by mouth at bedtime.     . simethicone (MYLICON) 80 MG chewable tablet Chew 1 tablet (80 mg total) by mouth every 8 (eight) hours as needed for flatulence. 60 tablet 3  . traMADol (ULTRAM) 50 MG tablet Take 1 tablet (50 mg total) by mouth every 8 (eight) hours as needed for moderate pain. 60 tablet 0  . pregabalin (LYRICA) 75 MG capsule Take 1 capsule (75 mg total) by mouth at bedtime. 30 capsule 1   No current facility-administered medications for this visit.    OBJECTIVE:  Filed Vitals:   01/31/15 1004  BP: 112/74  Pulse: 61  Temp: 96 F (35.6 C)     Body mass index is 29.6 kg/(m^2).    ECOG FS:1 - Symptomatic but completely ambulatory  PHYSICAL EXAM: General  status: Performance status is good.  Patient has  not lost significant weight HEENT: No evidence of stomatitis. Sclera and conjunctivae :: No jaundice.   pale looking. Lungs: Air  entry equal on both sides.  No rhonchi.  No rales.  Cardiac: Heart sounds are normal.  No pericardial rub.  No murmur. Lymphatic system: Cervical, axillary, inguinal, lymph nodes not palpable GI: Abdomen is soft.  No ascites.  Liver spleen not palpable.  No tenderness.  Bowel sounds are within normal limit Lower extremity: No edema Neurological system: Higher functions, cranial nerves intact no evidence of peripheral neuropathy. Skin: No rash.  No ecchymosis.Marland Kitchen   LAB RESULTS:  Appointment on 01/31/2015  Component Date Value Ref Range Status  . WBC 01/31/2015 8.8  3.6 - 11.0 K/uL Final  . RBC 01/31/2015 4.00  3.80 - 5.20 MIL/uL Final  . Hemoglobin 01/31/2015 13.1  12.0 - 16.0 g/dL Final  . HCT 01/31/2015 39.4  35.0 - 47.0 % Final  . MCV 01/31/2015 98.6  80.0 - 100.0 fL Final  . MCH 01/31/2015 32.7  26.0 - 34.0 pg Final  . MCHC 01/31/2015 33.2  32.0 - 36.0 g/dL Final  . RDW 01/31/2015 13.0  11.5 - 14.5 % Final  . Platelets 01/31/2015 216  150 - 440 K/uL Final  . Neutrophils Relative % 01/31/2015 62   Final  . Neutro Abs 01/31/2015 5.5  1.4 - 6.5 K/uL Final  . Lymphocytes Relative 01/31/2015 28   Final  . Lymphs Abs 01/31/2015 2.5  1.0 - 3.6 K/uL Final  . Monocytes Relative 01/31/2015  8   Final  . Monocytes Absolute 01/31/2015 0.7  0.2 - 0.9 K/uL Final  . Eosinophils Relative 01/31/2015 1   Final  . Eosinophils Absolute 01/31/2015 0.1  0 - 0.7 K/uL Final  . Basophils Relative 01/31/2015 1   Final  . Basophils Absolute 01/31/2015 0.0  0 - 0.1 K/uL Final  . Sodium 01/31/2015 138  135 - 145 mmol/L Final  . Potassium 01/31/2015 3.7  3.5 - 5.1 mmol/L Final  . Chloride 01/31/2015 105  101 - 111 mmol/L Final  . CO2 01/31/2015 28  22 - 32 mmol/L Final  . Glucose, Bld 01/31/2015 93  65 - 99 mg/dL Final  . BUN 01/31/2015 16  6 - 20 mg/dL Final  . Creatinine, Ser  01/31/2015 0.63  0.44 - 1.00 mg/dL Final  . Calcium 01/31/2015 8.3* 8.9 - 10.3 mg/dL Final  . Total Protein 01/31/2015 6.0* 6.5 - 8.1 g/dL Final  . Albumin 01/31/2015 3.5  3.5 - 5.0 g/dL Final  . AST 01/31/2015 17  15 - 41 U/L Final  . ALT 01/31/2015 17  14 - 54 U/L Final  . Alkaline Phosphatase 01/31/2015 62  38 - 126 U/L Final  . Total Bilirubin 01/31/2015 0.6  0.3 - 1.2 mg/dL Final  . GFR calc non Af Amer 01/31/2015 >60  >60 mL/min Final  . GFR calc Af Amer 01/31/2015 >60  >60 mL/min Final   Comment: (NOTE) The eGFR has been calculated using the CKD EPI equation. This calculation has not been validated in all clinical situations. eGFR's persistently <60 mL/min signify possible Chronic Kidney Disease.   . Anion gap 01/31/2015 5  5 - 15 Final     STUDIES: No results found.  ASSESSMENT: Bilateral carcinoma of breast.  Status post chemotherapy. On Arimidex   MEDICAL DECISION MAKING:  All lab data has been reviewed there is no evidence of recurrent or progressive disease. Bone density study has been reviewed there is no evidence of osteoporosis or osteopenia  Repeat mammogram Continue anastrozole   patient continues to smoke  she was given information about quitting smoking    Patient expressed understanding and was in agreement with this plan. She also understands that She can call clinic at any time with any questions, concerns, or complaints.    Cancer of breast   Staging form: Breast, AJCC 7th Edition     Clinical: Stage IA (T1c, N0, M0) - Marni Griffon, MD   01/31/2015 7:49 PM

## 2015-01-31 NOTE — Progress Notes (Signed)
Patient currently smokes.  Does have living will. Complains of neuropathy in feet that is most bothersome at night.

## 2015-02-01 LAB — CANCER ANTIGEN 27.29: CA 27.29: 22.8 U/mL (ref 0.0–38.6)

## 2015-02-03 ENCOUNTER — Telehealth: Payer: Self-pay | Admitting: Gastroenterology

## 2015-02-03 MED ORDER — OMEPRAZOLE 40 MG PO CPDR: 40.0000 mg | DELAYED_RELEASE_CAPSULE | Freq: Every day | ORAL | Status: AC

## 2015-02-03 NOTE — Telephone Encounter (Signed)
Prescription sent to patient's pharmacy.

## 2015-02-07 ENCOUNTER — Telehealth: Payer: Self-pay | Admitting: Family Medicine

## 2015-02-07 DIAGNOSIS — Z0000 Encounter for general adult medical examination without abnormal findings: Secondary | ICD-10-CM

## 2015-02-07 NOTE — Telephone Encounter (Signed)
-----   Message from Ellamae Sia sent at 01/30/2015  2:47 PM EDT ----- Regarding: Lab orders for Wednesday,6.15.16 Patient is scheduled for CPX labs, please order future labs, Thanks , Karna Christmas

## 2015-02-08 ENCOUNTER — Other Ambulatory Visit: Payer: BLUE CROSS/BLUE SHIELD

## 2015-02-09 ENCOUNTER — Other Ambulatory Visit (INDEPENDENT_AMBULATORY_CARE_PROVIDER_SITE_OTHER): Payer: BLUE CROSS/BLUE SHIELD

## 2015-02-09 DIAGNOSIS — Z0000 Encounter for general adult medical examination without abnormal findings: Secondary | ICD-10-CM

## 2015-02-09 LAB — CBC WITH DIFFERENTIAL/PLATELET
BASOS ABS: 0 10*3/uL (ref 0.0–0.1)
Basophils Relative: 0.5 % (ref 0.0–3.0)
EOS ABS: 0.1 10*3/uL (ref 0.0–0.7)
Eosinophils Relative: 1.6 % (ref 0.0–5.0)
HCT: 41.1 % (ref 36.0–46.0)
Hemoglobin: 13.6 g/dL (ref 12.0–15.0)
LYMPHS ABS: 2.7 10*3/uL (ref 0.7–4.0)
Lymphocytes Relative: 29.8 % (ref 12.0–46.0)
MCHC: 33 g/dL (ref 30.0–36.0)
MCV: 99.5 fl (ref 78.0–100.0)
Monocytes Absolute: 0.7 10*3/uL (ref 0.1–1.0)
Monocytes Relative: 8.1 % (ref 3.0–12.0)
NEUTROS PCT: 60 % (ref 43.0–77.0)
Neutro Abs: 5.4 10*3/uL (ref 1.4–7.7)
PLATELETS: 241 10*3/uL (ref 150.0–400.0)
RBC: 4.13 Mil/uL (ref 3.87–5.11)
RDW: 13.4 % (ref 11.5–15.5)
WBC: 9 10*3/uL (ref 4.0–10.5)

## 2015-02-09 LAB — COMPREHENSIVE METABOLIC PANEL
ALK PHOS: 65 U/L (ref 39–117)
ALT: 16 U/L (ref 0–35)
AST: 15 U/L (ref 0–37)
Albumin: 3.9 g/dL (ref 3.5–5.2)
BUN: 20 mg/dL (ref 6–23)
CALCIUM: 9.1 mg/dL (ref 8.4–10.5)
CO2: 28 meq/L (ref 19–32)
Chloride: 106 mEq/L (ref 96–112)
Creatinine, Ser: 0.61 mg/dL (ref 0.40–1.20)
GFR: 108.49 mL/min (ref >60.00)
GLUCOSE: 101 mg/dL — AB (ref 70–99)
POTASSIUM: 4 meq/L (ref 3.5–5.1)
SODIUM: 139 meq/L (ref 135–145)
TOTAL PROTEIN: 6.1 g/dL (ref 6.0–8.3)
Total Bilirubin: 0.6 mg/dL (ref 0.2–1.2)

## 2015-02-09 LAB — TSH: TSH: 2.47 u[IU]/mL (ref 0.35–4.50)

## 2015-02-09 LAB — LIPID PANEL
Cholesterol: 139 mg/dL (ref 0–200)
HDL: 49.3 mg/dL (ref >39.00)
LDL Cholesterol: 76 mg/dL (ref 0–99)
NonHDL: 89.7
TRIGLYCERIDES: 69 mg/dL (ref 0.0–149.0)
Total CHOL/HDL Ratio: 3
VLDL: 13.8 mg/dL (ref 0.0–40.0)

## 2015-02-10 ENCOUNTER — Ambulatory Visit (INDEPENDENT_AMBULATORY_CARE_PROVIDER_SITE_OTHER): Payer: BLUE CROSS/BLUE SHIELD | Admitting: Family Medicine

## 2015-02-10 ENCOUNTER — Encounter: Payer: Self-pay | Admitting: Family Medicine

## 2015-02-10 ENCOUNTER — Other Ambulatory Visit (HOSPITAL_COMMUNITY)
Admission: RE | Admit: 2015-02-10 | Discharge: 2015-02-10 | Disposition: A | Discharge status: Home / Self Care | Payer: BLUE CROSS/BLUE SHIELD | Source: Ambulatory Visit | Attending: Family Medicine | Admitting: Family Medicine

## 2015-02-10 VITALS — BP 126/78 | HR 91 | Temp 98.1°F | Ht 62.75 in | Wt 168.8 lb

## 2015-02-10 DIAGNOSIS — Z0000 Encounter for general adult medical examination without abnormal findings: Secondary | ICD-10-CM

## 2015-02-10 DIAGNOSIS — Z1151 Encounter for screening for human papillomavirus (HPV): Secondary | ICD-10-CM | POA: Diagnosis present

## 2015-02-10 DIAGNOSIS — I10 Essential (primary) hypertension: Secondary | ICD-10-CM | POA: Diagnosis not present

## 2015-02-10 DIAGNOSIS — Z720 Tobacco use: Secondary | ICD-10-CM

## 2015-02-10 DIAGNOSIS — Z01419 Encounter for gynecological examination (general) (routine) without abnormal findings: Secondary | ICD-10-CM | POA: Insufficient documentation

## 2015-02-10 DIAGNOSIS — E7800 Pure hypercholesterolemia, unspecified: Secondary | ICD-10-CM

## 2015-02-10 DIAGNOSIS — F17200 Nicotine dependence, unspecified, uncomplicated: Secondary | ICD-10-CM

## 2015-02-10 MED ORDER — ASPIRIN 81 MG PO CHEW: 81.0000 mg | CHEWABLE_TABLET | Freq: Every day | ORAL | Status: AC

## 2015-02-10 MED ORDER — LOSARTAN POTASSIUM 50 MG PO TABS: 50.0000 mg | ORAL_TABLET | Freq: Every day | ORAL | Status: DC

## 2015-02-10 NOTE — Progress Notes (Signed)
Subjective:    Patient ID: Amy Leonard, female    DOB: Mar 22, 1961, 54 y.o.   MRN: [id number]  HPI Here for health maintenance exam and to review chronic medical problems    Wt is down 1 lb with bmi of 30  Has been doing pretty well  Still re gaining her strength from chemo- especially in her legs --from breast cancer  Getting stronger, but some numbness/neuropathy in feet (on lyrica) Back still really hurts   Wt is down 1 lb with bmi of 30   Not interested in hep C screening right now   Flu shot-never got it   Pap 1/14 - last one , no gyn problems  Getting through menopause-no period in 2 y  Still some hot flashes Worse with arimidex   Td 1/14  Colonoscopy 2/14 3/15 dexa normal range   Mammogram 4/16 -cleared for f/u in a year   bp is stable today  No cp or palpitations or headaches or edema  No side effects to medicines  BP Readings from Last 3 Encounters:  02/10/15 126/78  01/31/15 112/74  05/10/14 122/82     Hyperlipidemia Lab Results  Component Value Date   CHOL 139 02/09/2015   CHOL 165 02/02/2014   CHOL 191 08/24/2012   Lab Results  Component Value Date   HDL 49.30 02/09/2015   HDL 47.30 02/02/2014   HDL 45.50 08/24/2012   Lab Results  Component Value Date   LDLCALC 76 02/09/2015   LDLCALC 100* 02/02/2014   LDLCALC 120* 08/24/2012   Lab Results  Component Value Date   TRIG 69.0 02/09/2015   TRIG 91.0 02/02/2014   TRIG 129.0 08/24/2012   Lab Results  Component Value Date   CHOLHDL 3 02/09/2015   CHOLHDL 3 02/02/2014   CHOLHDL 4 08/24/2012   Lab Results  Component Value Date   LDLDIRECT 130.3 01/26/2010    Very good cholesterol control  On lipitor and diet   Smoking - is worse again  She intends to quit   Lab Results  Component Value Date   WBC 9.0 02/09/2015   HGB 13.6 02/09/2015   HCT 41.1 02/09/2015   MCV 99.5 02/09/2015   PLT 241.0 02/09/2015     Chemistry      Component Value Date/Time   NA 139 02/09/2015 0936     NA 140 07/25/2014 1100   NA 140 11/02/2013 1040   K 4.0 02/09/2015 0936   K 4.1 07/25/2014 1100   K 3.9 11/02/2013 1040   CL 106 02/09/2015 0936   CL 105 07/25/2014 1100   CL 109* 02/17/2013 1120   CO2 28 02/09/2015 0936   CO2 30 07/25/2014 1100   CO2 24 11/02/2013 1040   BUN 20 02/09/2015 0936   BUN 15 07/25/2014 1100   BUN 9.3 11/02/2013 1040   CREATININE 0.61 02/09/2015 0936   CREATININE 0.74 07/25/2014 1100   CREATININE 0.7 11/02/2013 1040      Component Value Date/Time   CALCIUM 9.1 02/09/2015 0936   CALCIUM 8.6 07/25/2014 1100   CALCIUM 9.5 11/02/2013 1040   ALKPHOS 65 02/09/2015 0936   ALKPHOS 75 07/25/2014 1100   ALKPHOS 84 11/02/2013 1040   AST 15 02/09/2015 0936   AST 16 07/25/2014 1100   AST 20 11/02/2013 1040   ALT 16 02/09/2015 0936   ALT 24 07/25/2014 1100   ALT 22 11/02/2013 1040   BILITOT 0.6 02/09/2015 0936   BILITOT 0.48 11/02/2013 1040  Lab Results  Component Value Date   TSH 2.47 02/09/2015      Review of Systems Review of Systems  Constitutional: Negative for fever, appetite change, fatigue and unexpected weight change.  Eyes: Negative for pain and visual disturbance.  Respiratory: Negative for cough and shortness of breath.   Cardiovascular: Negative for cp or palpitations    Gastrointestinal: Negative for nausea, diarrhea and constipation.  Genitourinary: Negative for urgency and frequency.  Skin: Negative for pallor or rash   Neurological: Negative for weakness, light-headedness, numbness and headaches.  Hematological: Negative for adenopathy. Does not bruise/bleed easily.  Psychiatric/Behavioral: Negative for dysphoric mood. The patient is not nervous/anxious.         Objective:   Physical Exam  Constitutional: She appears well-developed and well-nourished. No distress.  obese and well appearing   HENT:  Head: Normocephalic and atraumatic.  Right Ear: External ear normal.  Left Ear: External ear normal.  Mouth/Throat:  Oropharynx is clear and moist.  Eyes: Conjunctivae and EOM are normal. Pupils are equal, round, and reactive to light. No scleral icterus.  Neck: Normal range of motion. Neck supple. No JVD present. Carotid bruit is not present. No thyromegaly present.  Cardiovascular: Normal rate, regular rhythm, normal heart sounds and intact distal pulses.  Exam reveals no gallop.   Pulmonary/Chest: Effort normal and breath sounds normal. No respiratory distress. She has no wheezes. She exhibits no tenderness.  Diffusely distant bs   Abdominal: Soft. Bowel sounds are normal. She exhibits no distension, no abdominal bruit and no mass. There is no tenderness.  Genitourinary: Vagina normal and uterus normal. No breast swelling, tenderness, discharge or bleeding. There is no rash, tenderness or lesion on the right labia. There is no rash, tenderness or lesion on the left labia. Uterus is not enlarged and not tender. Cervix exhibits no motion tenderness, no discharge and no friability. Right adnexum displays no mass, no tenderness and no fullness. Left adnexum displays no mass, no tenderness and no fullness. No bleeding in the vagina. No vaginal discharge found.  Breast exam: No mass, nodules, thickening, tenderness, bulging, retraction, inflamation, nipple discharge or skin changes noted.  No axillary or clavicular LA.    Baseline surgical changes noted   Musculoskeletal: Normal range of motion. She exhibits no edema or tenderness.  Poor rom LS   Lymphadenopathy:    She has no cervical adenopathy.  Neurological: She is alert. She has normal reflexes. No cranial nerve deficit. She exhibits normal muscle tone. Coordination normal.  Skin: Skin is warm and dry. No rash noted. No erythema. No pallor.  Psychiatric: She has a normal mood and affect.          Assessment & Plan:   Problem List Items Addressed This Visit    Encounter for routine gynecological examination    Routine exam and pap done in pt with hx of  breast cancer  No c/o       Relevant Orders   Cytology - PAP   Essential hypertension (Chronic)    bp in fair control at this time  BP Readings from Last 1 Encounters:  02/10/15 126/78   No changes needed Disc lifstyle change with low sodium diet and exercise  Labs reviewed  Wt loss enc and smoking cessation       Relevant Medications   losartan (COZAAR) 50 MG tablet   aspirin 81 MG chewable tablet   Pure hypercholesterolemia    Disc goals for lipids and reasons to control them Rev  labs with pt Rev low sat fat diet in detail Well controlled with atorvastatin and diet       Relevant Medications   losartan (COZAAR) 50 MG tablet   aspirin 81 MG chewable tablet   Routine general medical examination at a health care facility    Reviewed health habits including diet and exercise and skin cancer prevention Reviewed appropriate screening tests for age  Also reviewed health mt list, fam hx and immunization status , as well as social and family history   See HPI Labs reviewed  Work on quitting smoking -good luck  Eat a healthy diet and drink lots of water Also stay as active as you can be  Take your calcium and vitamin D for bone health       TOBACCO USE    Disc in detail risks of smoking and possible outcomes including copd, vascular/ heart disease, cancer , respiratory and sinus infections  Pt voices understanding Pt does want to quit  Starting with nicotine patches  Thinking about nicotine free E cig to help with habit Enc her to move forward

## 2015-02-10 NOTE — Patient Instructions (Signed)
Work on quitting smoking -good luck  Eat a healthy diet and drink lots of water Also stay as active as you can be  Take your calcium and vitamin D for bone health

## 2015-02-10 NOTE — Progress Notes (Signed)
Pre visit review using our clinic review tool, if applicable. No additional management support is needed unless otherwise documented below in the visit note. 

## 2015-02-11 NOTE — Assessment & Plan Note (Signed)
Disc in detail risks of smoking and possible outcomes including copd, vascular/ heart disease, cancer , respiratory and sinus infections  Pt voices understanding Pt does want to quit  Starting with nicotine patches  Thinking about nicotine free E cig to help with habit Enc her to move forward

## 2015-02-11 NOTE — Assessment & Plan Note (Signed)
Disc goals for lipids and reasons to control them Rev labs with pt Rev low sat fat diet in detail  Well controlled with atorvastatin and diet  

## 2015-02-11 NOTE — Assessment & Plan Note (Signed)
Reviewed health habits including diet and exercise and skin cancer prevention Reviewed appropriate screening tests for age  Also reviewed health mt list, fam hx and immunization status , as well as social and family history   See HPI Labs reviewed  Work on quitting smoking -good luck  Eat a healthy diet and drink lots of water Also stay as active as you can be  Take your calcium and vitamin D for bone health

## 2015-02-11 NOTE — Assessment & Plan Note (Signed)
Routine exam and pap done in pt with hx of breast cancer  No c/o

## 2015-02-11 NOTE — Assessment & Plan Note (Signed)
bp in fair control at this time  BP Readings from Last 1 Encounters:  02/10/15 126/78   No changes needed Disc lifstyle change with low sodium diet and exercise  Labs reviewed  Wt loss enc and smoking cessation

## 2015-02-13 LAB — CYTOLOGY - PAP

## 2015-02-15 ENCOUNTER — Encounter: Payer: Self-pay | Admitting: *Deleted

## 2015-03-13 ENCOUNTER — Encounter: Payer: Self-pay | Admitting: Family Medicine

## 2015-03-13 ENCOUNTER — Ambulatory Visit (INDEPENDENT_AMBULATORY_CARE_PROVIDER_SITE_OTHER): Payer: BLUE CROSS/BLUE SHIELD | Admitting: Family Medicine

## 2015-03-13 VITALS — BP 100/70 | HR 85 | Temp 98.2°F | Ht 62.75 in | Wt 168.2 lb

## 2015-03-13 DIAGNOSIS — M25561 Pain in right knee: Secondary | ICD-10-CM | POA: Diagnosis not present

## 2015-03-13 DIAGNOSIS — M7651 Patellar tendinitis, right knee: Secondary | ICD-10-CM

## 2015-03-13 MED ORDER — NITROGLYCERIN 0.2 MG/HR TD PT24: MEDICATED_PATCH | TRANSDERMAL | Status: DC

## 2015-03-13 NOTE — Progress Notes (Signed)
Pre visit review using our clinic review tool, if applicable. No additional management support is needed unless otherwise documented below in the visit note. 

## 2015-03-13 NOTE — Progress Notes (Signed)
Dr. Frederico Hamman T. Delcenia Inman, MD, [facility] [place], [address] Phone: [phone] Fax: [phone]  03/13/2015  Patient: Amy Leonard, MRN: [id number], DOB: [birth year], 54 y.o.  Primary Physician:  [name], MD  Chief Complaint: Knee Pain  Subjective:   Amy Leonard is a 54 y.o. very pleasant female patient who presents with the following:  Very pleasant patient who I remember over the years, having seen multiple family members, who presents with anterior knee pain near the tibial tubercle.  She has not had any specific trauma or accident, but she does repetitively get on her right knee when she is at work.  Currently, she has a minimal effusion.  Pain localizes to this area anteriorly.  She is not really having any medial or lateral joint line tenderness.  She is having some pain just adjacent to this.  All this has been happening for a number of months, and is worsened over the last 2.  R knee is swollen some and had it aspirated a few yers ago.  Getting some shooting pain lower. If hits it will hurt.  Works at [employer] now.  Not doing heavy lifting now.   Walking on concrete.    Past Medical History, Surgical History, Social History, Family History, Problem List, Medications, and Allergies have been reviewed and updated if relevant.  Patient Active Problem List   Diagnosis Date Noted  . Encounter for routine gynecological examination 02/10/2015  . Routine general medical examination at a health care facility 02/07/2015  . Cancer of breast 01/31/2015  . Degenerative disc disease, lumbar 02/04/2014  . Anterolisthesis 02/04/2014  . Low back pain 02/02/2014  . Abnormal urinalysis 02/02/2014  . Postmenopausal estrogen deficiency 09/02/2013  . Febrile neutropenia 01/06/2013  . Breast cancer 01/06/2013  . Anemia of chronic disease 01/06/2013  . Thrombocytopenia, unspecified 01/06/2013  . Lacunar infarction  04/17/2012  . 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 Pure hypercholesterolemia 04/05/2008  . Essential hypertension 03/30/2008  . ANXIETY DEPRESSION 11/17/2007  . TOBACCO USE 11/17/2007  . BACK PAIN, CHRONIC 09/23/2007    Past Medical History  Diagnosis Date  . Elevated WBC count     nl diff  . Chronic back pain     spinal stenosis  . Tobacco abuse   . Anxiety and depression   . HSV infection     recurrent (side and buttox)  . Migraine   . Glaucoma     ?  . Carpal tunnel syndrome   . HLD (hyperlipidemia)   . Folliculitis 10/27/5454  . Anxiety   . Depression   . 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 GERD (gastroesophageal reflux disease)   . Hypertension   . Breast cancer   . Wears dentures     top  . Erosive gastritis   . Barrett's esophageal ulceration   . Cancer of breast 01/31/2015    Past Surgical History  Procedure Laterality Date  . Btl    . Epidural steroid injection  2008  . Colonoscopy    . Upper gi endoscopy    . Partial mastectomy with needle localization and axillary sentinel lymph node bx Bilateral 11/23/2012    Procedure: PARTIAL MASTECTOMY WITH NEEDLE LOCALIZATION AND AXILLARY SENTINEL LYMPH NODE BX;  Surgeon: Adin Hector, MD;  Location: [facility];  Service: General;  Laterality: Bilateral;  . Portacath placement Right 11/23/2012    Procedure: INSERTION PORT-A-CATH;  Surgeon: Adin Hector,  MD;  Location: [facility];  Service: General;  Laterality: Right;  . Port-a-cath removal Right 08/25/2013    Procedure: REMOVAL PORT-A-CATH;  Surgeon: Adin Hector, MD;  Location: [facility];  Service: General;  Laterality: Right;    History   Social History  . Marital Status: Married    Spouse Name: N/A  . Number of Children: N/A  . Years of Education: N/A   Occupational History  . Full time and PT job    Social History Main Topics  . Smoking status: Current Every Day Smoker -- 1.00 packs/day for 33  years    Types: Cigarettes  . Smokeless tobacco: Never Used     Comment: 1 1/2 ppd -form given 10-07-12  . Alcohol Use: 0.0 oz/week    0 Standard drinks or equivalent per week     Comment: beer rarely  . Drug Use: No  . Sexual Activity: Not Currently   Other Topics Concern  . Not on file   Social History Narrative   Separated; full time and part time job; no regular exercise.              Family History  Problem Relation Age of Onset  . Stroke Maternal Grandmother   . Diabetes Maternal Grandmother   . Diabetes Mother   . Hypertension Mother   . Leukemia Mother   . Obesity Mother   . Depression Sister   . Bipolar disorder Son     Bipolar / oppositional defiant  . Alcohol abuse Sister   . Drug abuse Sister   . Alcohol abuse Sister   . Drug abuse Sister   . Colon cancer Neg Hx   . Alcohol abuse Brother   . Drug abuse Brother     Allergies  Allergen Reactions  . Bupropion Hcl Nausea Only and Other (See Comments)     GI, sleepy  . Chlorhexidine Gluconate Itching and Rash  . Hydrocod Polst-Cpm Polst Er Nausea And Vomiting    vomiting  . Lisinopril Other (See Comments)    Leg cramps    . Naproxen Sodium Other (See Comments)    does not tolerate    Medication list reviewed and updated in full in [facility].  GEN: No fevers, chills. Nontoxic. Primarily MSK c/o today. MSK: Detailed in the HPI GI: tolerating PO intake without difficulty Neuro: No numbness, parasthesias, or tingling associated. Otherwise the pertinent positives of the ROS are noted above.   Objective:   BP 100/70 mmHg  Pulse 85  Temp(Src) 98.2 F (36.8 C) (Oral)  Ht 5' 2.75" (1.594 m)  Wt 168 lb 4 oz (76.318 kg)  BMI 30.04 kg/m2   GEN: WDWN, NAD, Non-toxic, Alert & Oriented x 3 HEENT: Atraumatic, Normocephalic.  Ears and Nose: No external deformity. EXTR: No clubbing/cyanosis/edema NEURO: Normal gait.  PSYCH: Normally interactive. Conversant. Not depressed or anxious appearing.   Calm demeanor.   Knee:  R Gait: Normal heel toe pattern ROM: 0-125 Effusion: neg Echymosis or edema: none Patellar tendon: Swollen with bump and TTP at distal insertion Painful PLICA: neg Patellar grind: negative Medial and lateral patellar facet loading: negative medial and lateral joint lines:NT Mcmurray's neg Flexion-pinch neg Varus and valgus stress: stable Lachman: neg Ant and Post drawer: neg Hip abduction, IR, ER: WNL Hip flexion str: 5/5 Hip abd: 5/5 Quad: 5/5 VMO atrophy:No Hamstring concentric and eccentric: 5/5   Radiology: No results found.  Assessment and Plan:   Patellar tendinitis, right  Right anterior knee pain  insertional patellar tendinopathy with nodule.  Recommended carpenter style knee pads to wear on the right knee at work. Reviewed eccentric knee lowers using wall is a back rest Nitroglycerin protocol  If she does poorly, Decadron iontophoresis tends to help this quite well.  Follow-up: prn if not improved in 6-8 weeks  New Prescriptions   NITROGLYCERIN (NITRO-DUR) 0.2 MG/HR PATCH    Apply 1/4 of a patch to the affected area and change every 24 hours (re: tendinopathy)   Patient Instructions  Nitroglycerin Protocol   Apply 1/4 nitroglycerin patch to affected area daily.  Change position of patch within the affected area every 24 hours.  You may experience a headache during the first 1-2 weeks of using the patch, these should subside.  If you experience headaches after beginning nitroglycerin patch treatment, you may take your preferred over the counter pain reliever.  Another side effect of the nitroglycerin patch is skin irritation or rash related to patch adhesive.  Please notify our office if you develop more severe headaches or rash, and stop the patch.  Tendon healing with nitroglycerin patch may require 12 to 24 weeks depending on the extent of injury.  Men should not use if taking Viagra, Cialis, or Levitra.   Do not  use if you have migraines or rosacea.    Signed,  Maud Deed. Sanad Fearnow, MD   Patient's Medications  New Prescriptions   NITROGLYCERIN (NITRO-DUR) 0.2 MG/HR PATCH    Apply 1/4 of a patch to the affected area and change every 24 hours (re: tendinopathy)  Previous Medications   ACETAMINOPHEN (TYLENOL ARTHRITIS PAIN PO)    Take 2 tablets by mouth as needed (pain).    ACYCLOVIR OINTMENT (ZOVIRAX) 5 %    Apply 1 application topically as needed (flare-up).   ANASTROZOLE (ARIMIDEX) 1 MG TABLET    Take 1 tablet (1 mg total) by mouth daily.   ASPIRIN 81 MG CHEWABLE TABLET    Chew 1 tablet (81 mg total) by mouth daily.   ATORVASTATIN (LIPITOR) 10 MG TABLET    Take 1 tablet (10 mg total) by mouth daily.   B COMPLEX VITAMINS TABLET    Take 1 tablet by mouth daily.   CALCIUM CARBONATE (TUMS - DOSED IN MG ELEMENTAL CALCIUM) 500 MG CHEWABLE TABLET    Chew by mouth as directed.     CALCIUM CARBONATE-VITAMIN D (CALCIUM + D PO)    Take 1 tablet by mouth daily.   LOSARTAN (COZAAR) 50 MG TABLET    Take 1 tablet (50 mg total) by mouth daily.   NICOTINE (NICODERM CQ - DOSED IN MG/24 HOURS) 21 MG/24HR PATCH    Place 1 patch onto the skin daily.   OMEPRAZOLE (PRILOSEC) 40 MG CAPSULE    Take 1 capsule (40 mg total) by mouth daily.   PREGABALIN (LYRICA) 75 MG CAPSULE    Take 1 capsule (75 mg total) by mouth at bedtime.   RANITIDINE (ZANTAC) 300 MG TABLET    Take 150 mg by mouth at bedtime.    SIMETHICONE (MYLICON) 80 MG CHEWABLE TABLET    Chew 1 tablet (80 mg total) by mouth every 8 (eight) hours as needed for flatulence.   TRAMADOL (ULTRAM) 50 MG TABLET    Take 1 tablet (50 mg total) by mouth every 8 (eight) hours as needed for moderate pain.  Modified Medications   No medications on file  Discontinued Medications   No medications on file

## 2015-03-13 NOTE — Patient Instructions (Signed)

## 2015-03-15 ENCOUNTER — Encounter: Payer: Self-pay | Admitting: Family Medicine

## 2015-03-15 ENCOUNTER — Ambulatory Visit (INDEPENDENT_AMBULATORY_CARE_PROVIDER_SITE_OTHER): Payer: BLUE CROSS/BLUE SHIELD | Admitting: Family Medicine

## 2015-03-15 VITALS — BP 110/62 | HR 85 | Temp 98.6°F | Ht 62.75 in | Wt 170.5 lb

## 2015-03-15 DIAGNOSIS — M5136 Other intervertebral disc degeneration, lumbar region: Secondary | ICD-10-CM | POA: Diagnosis not present

## 2015-03-15 NOTE — Progress Notes (Signed)
Pre visit review using our clinic review tool, if applicable. No additional management support is needed unless otherwise documented below in the visit note. 

## 2015-03-15 NOTE — Progress Notes (Signed)
Subjective:    Patient ID: Amy Leonard, female    DOB: 06/15/1961, 54 y.o.   MRN: [id number]  HPI Here to discuss back pain   Has had back problems going on for years  Pain is in her lower back and jumps from side to side  Both legs feel numb at times - can extend from buttocks to feet and painful  Still working right now - takes ibuprofen (when really bad)- even though she is "not supposed" to take it  No weakness   Has opportunity to have [employer] pay for the injury  Offering to covered family member   Have 3 hospitals included in the program  Would have to go out of state for the surgery and then return  ? If neuro surgery or orthopedics   Had MRI 13 mo ago - would need another MRI  Needs another one to see if she qualifies for the program   [facility]    Has a dx of degenerative disc  Has seen pain management - last went years ago -shots helped just a little bit  Ortho - [facility]- did MR and dx was "severe spinal stenosis"  They recommended surgery - discussed a fusion (would be in a back brace for 5-6 mo)   Of note hx of cva and also smoking will inc her general surgical risks    Patient Active Problem List   Diagnosis Date Noted  . Encounter for routine gynecological examination 02/10/2015  . Routine general medical examination at a health care facility 02/07/2015  . Cancer of breast 01/31/2015  . Degenerative disc disease, lumbar 02/04/2014  . Anterolisthesis 02/04/2014  . Low back pain 02/02/2014  . Abnormal urinalysis 02/02/2014  . Postmenopausal estrogen deficiency 09/02/2013  . Febrile neutropenia 01/06/2013  . Breast cancer 01/06/2013  . Anemia of chronic disease 01/06/2013  . Thrombocytopenia, unspecified 01/06/2013  . 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 Pure hypercholesterolemia 04/05/2008  . Essential hypertension 03/30/2008  . ANXIETY DEPRESSION 11/17/2007  .  TOBACCO USE 11/17/2007  . BACK PAIN, CHRONIC 09/23/2007   Past Medical History  Diagnosis Date  . Elevated WBC count     nl diff  . Chronic back pain     spinal stenosis  . Tobacco abuse   . Anxiety and depression   . HSV infection     recurrent (side and buttox)  . Migraine   . Glaucoma     ?  . Carpal tunnel syndrome   . HLD (hyperlipidemia)   . Folliculitis 03/26/174  . Anxiety   . Depression   . 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 GERD (gastroesophageal reflux disease)   . Hypertension   . Breast cancer   . Wears dentures     top  . Erosive gastritis   . Barrett's esophageal ulceration   . Cancer of breast 01/31/2015   Past Surgical History  Procedure Laterality Date  . Btl    . Epidural steroid injection  2008  . Colonoscopy    . Upper gi endoscopy    . Partial mastectomy with needle localization and axillary sentinel lymph node bx Bilateral 11/23/2012    Procedure: PARTIAL MASTECTOMY WITH NEEDLE LOCALIZATION AND AXILLARY SENTINEL LYMPH NODE BX;  Surgeon: Adin Hector, MD;  Location: [place];  Service: General;  Laterality: Bilateral;  . Portacath placement Right 11/23/2012    Procedure: INSERTION PORT-A-CATH;  Surgeon: Adin Hector, MD;  Location: [place];  Service: General;  Laterality: Right;  . Port-a-cath removal Right 08/25/2013    Procedure: REMOVAL PORT-A-CATH;  Surgeon: Adin Hector, MD;  Location: [place];  Service: General;  Laterality: Right;   History  Substance Use Topics  . Smoking status: Current Every Day Smoker -- 1.00 packs/day for 33 years    Types: Cigarettes  . Smokeless tobacco: Never Used     Comment: 1 1/2 ppd -form given 10-07-12  . Alcohol Use: 0.0 oz/week    0 Standard drinks or equivalent per week     Comment: beer rarely   Family History  Problem Relation Age of Onset  . Stroke Maternal Grandmother   . Diabetes Maternal Grandmother   . Diabetes Mother   .  Hypertension Mother   . Leukemia Mother   . Obesity Mother   . Depression Sister   . Bipolar disorder Son     Bipolar / oppositional defiant  . Alcohol abuse Sister   . Drug abuse Sister   . Alcohol abuse Sister   . Drug abuse Sister   . Colon cancer Neg Hx   . Alcohol abuse Brother   . Drug abuse Brother    Allergies  Allergen Reactions  . Bupropion Hcl Nausea Only and Other (See Comments)     GI, sleepy  . Chlorhexidine Gluconate Itching and Rash  . Hydrocod Polst-Cpm Polst Er Nausea And Vomiting    vomiting  . Lisinopril Other (See Comments)    Leg cramps    . Naproxen Sodium Other (See Comments)    does not tolerate   Current Outpatient Prescriptions on File Prior to Visit  Medication Sig Dispense Refill  . Acetaminophen (TYLENOL ARTHRITIS PAIN PO) Take 2 tablets by mouth as needed (pain).     Marland Kitchen acyclovir ointment (ZOVIRAX) 5 % Apply 1 application topically as needed (flare-up). 15 g 1  . anastrozole (ARIMIDEX) 1 MG tablet Take 1 tablet (1 mg total) by mouth daily. 90 tablet 12  . aspirin 81 MG chewable tablet Chew 1 tablet (81 mg total) by mouth daily. 90 tablet 3  . atorvastatin (LIPITOR) 10 MG tablet Take 1 tablet (10 mg total) by mouth daily. 90 tablet 0  . b complex vitamins tablet Take 1 tablet by mouth daily.    . calcium carbonate (TUMS - DOSED IN MG ELEMENTAL CALCIUM) 500 MG chewable tablet Chew by mouth as directed.      . Calcium Carbonate-Vitamin D (CALCIUM + D PO) Take 1 tablet by mouth daily.    Marland Kitchen losartan (COZAAR) 50 MG tablet Take 1 tablet (50 mg total) by mouth daily. 90 tablet 3  . nicotine (NICODERM CQ - DOSED IN MG/24 HOURS) 21 mg/24hr patch Place 1 patch onto the skin daily. 28 patch 0  . nitroGLYCERIN (NITRO-DUR) 0.2 mg/hr patch Apply 1/4 of a patch to the affected area and change every 24 hours (re: tendinopathy) 30 patch 12  . omeprazole (PRILOSEC) 40 MG capsule Take 1 capsule (40 mg total) by mouth daily. 90 capsule 0  . pregabalin (LYRICA) 75 MG  capsule Take 1 capsule (75 mg total) by mouth at bedtime. 30 capsule 1  . ranitidine (ZANTAC) 300 MG tablet Take 150 mg by mouth at bedtime.     . simethicone (MYLICON) 80 MG chewable tablet Chew 1 tablet (80 mg total) by mouth every 8 (eight) hours as needed for flatulence. 60 tablet 3  .  traMADol (ULTRAM) 50 MG tablet Take 1 tablet (50 mg total) by mouth every 8 (eight) hours as needed for moderate pain. 60 tablet 0   No current facility-administered medications on file prior to visit.     Review of Systems Review of Systems  Constitutional: Negative for fever, appetite change, fatigue and unexpected weight change.  Eyes: Negative for pain and visual disturbance.  Respiratory: Negative for cough and shortness of breath.   Cardiovascular: Negative for cp or palpitations    Gastrointestinal: Negative for nausea, diarrhea and constipation.  Genitourinary: Negative for urgency and frequency.  Skin: Negative for pallor or rash   MSK pos for chronic back pain that limits her activities  Neurological: Negative for weakness, light-headedness, numbness and headaches.  Hematological: Negative for adenopathy. Does not bruise/bleed easily.  Psychiatric/Behavioral: Negative for dysphoric mood. The patient is not nervous/anxious.         Objective:   Physical Exam  Constitutional: She appears well-developed and well-nourished. No distress.  obese and well appearing   HENT:  Head: Normocephalic and atraumatic.  Eyes: Conjunctivae and EOM are normal. Pupils are equal, round, and reactive to light.  Neck: Normal range of motion. Neck supple.  Cardiovascular: Normal rate and regular rhythm.   Pulmonary/Chest: Effort normal and breath sounds normal.  Musculoskeletal: She exhibits tenderness. She exhibits no edema.  Some tenderness over L3-L5 area  Neg SLR today Nl rom hips Limited ext of LS - 10 deg    Lymphadenopathy:    She has no cervical adenopathy.  Neurological: She is alert. She has  normal strength and normal reflexes. She displays no atrophy. No cranial nerve deficit or sensory deficit. She exhibits normal muscle tone. Coordination normal.  Skin: Skin is warm and dry. No rash noted.  Psychiatric: She has a normal mood and affect.          Assessment & Plan:   Problem List Items Addressed This Visit    Degenerative disc disease, lumbar - Primary    In a smoker  She has severe spinal stenosis in L4-5 and has several poss options for tx out of state  Will check into these and then request MRI if needed  Did briefly rev her [facility] notes-recommending decompression and fusion   She will get back to [place] YK:[id number] she wants to do

## 2015-03-15 NOTE — Assessment & Plan Note (Signed)
In a smoker  She has severe spinal stenosis in L4-5 and has several poss options for tx out of state  Will check into these and then request MRI if needed  Did briefly rev her [facility] notes-recommending decompression and fusion   She will get back to [place] LR:JPVG she wants to do

## 2015-03-15 NOTE — Patient Instructions (Signed)
If you decide to go forward with an MRI - let me know and I will order it  Take a look at your options for treatment   Get your questions answered first as well

## 2015-05-11 ENCOUNTER — Other Ambulatory Visit: Payer: Self-pay | Admitting: Gastroenterology

## 2015-05-12 ENCOUNTER — Other Ambulatory Visit: Payer: Self-pay | Admitting: *Deleted

## 2015-05-12 MED ORDER — ATORVASTATIN CALCIUM 10 MG PO TABS: 10.0000 mg | ORAL_TABLET | Freq: Every day | ORAL | Status: DC

## 2015-07-31 ENCOUNTER — Encounter: Payer: Self-pay | Admitting: Radiation Oncology

## 2015-07-31 ENCOUNTER — Ambulatory Visit
Admission: RE | Admit: 2015-07-31 | Discharge: 2015-07-31 | Disposition: A | Discharge status: Home / Self Care | Payer: BLUE CROSS/BLUE SHIELD | Source: Ambulatory Visit | Attending: Radiation Oncology | Admitting: Radiation Oncology

## 2015-07-31 ENCOUNTER — Encounter: Payer: Self-pay | Admitting: Oncology

## 2015-07-31 ENCOUNTER — Inpatient Hospital Stay: Payer: BLUE CROSS/BLUE SHIELD | Attending: Oncology

## 2015-07-31 ENCOUNTER — Inpatient Hospital Stay (HOSPITAL_BASED_OUTPATIENT_CLINIC_OR_DEPARTMENT_OTHER): Payer: BLUE CROSS/BLUE SHIELD | Admitting: Oncology

## 2015-07-31 VITALS — BP 110/78 | HR 80 | Temp 97.8°F | Wt 170.7 lb

## 2015-07-31 DIAGNOSIS — M549 Dorsalgia, unspecified: Secondary | ICD-10-CM | POA: Insufficient documentation

## 2015-07-31 DIAGNOSIS — E785 Hyperlipidemia, unspecified: Secondary | ICD-10-CM

## 2015-07-31 DIAGNOSIS — F17210 Nicotine dependence, cigarettes, uncomplicated: Secondary | ICD-10-CM | POA: Diagnosis not present

## 2015-07-31 DIAGNOSIS — C50312 Malignant neoplasm of lower-inner quadrant of left female breast: Secondary | ICD-10-CM | POA: Diagnosis not present

## 2015-07-31 DIAGNOSIS — Z170 Estrogen receptor positive status [ER+]: Secondary | ICD-10-CM

## 2015-07-31 DIAGNOSIS — F418 Other specified anxiety disorders: Secondary | ICD-10-CM | POA: Insufficient documentation

## 2015-07-31 DIAGNOSIS — C50919 Malignant neoplasm of unspecified site of unspecified female breast: Secondary | ICD-10-CM

## 2015-07-31 DIAGNOSIS — Z79811 Long term (current) use of aromatase inhibitors: Secondary | ICD-10-CM | POA: Diagnosis not present

## 2015-07-31 DIAGNOSIS — K219 Gastro-esophageal reflux disease without esophagitis: Secondary | ICD-10-CM | POA: Insufficient documentation

## 2015-07-31 DIAGNOSIS — Z7982 Long term (current) use of aspirin: Secondary | ICD-10-CM | POA: Insufficient documentation

## 2015-07-31 DIAGNOSIS — I10 Essential (primary) hypertension: Secondary | ICD-10-CM | POA: Insufficient documentation

## 2015-07-31 DIAGNOSIS — Z171 Estrogen receptor negative status [ER-]: Secondary | ICD-10-CM

## 2015-07-31 DIAGNOSIS — Z923 Personal history of irradiation: Secondary | ICD-10-CM | POA: Diagnosis not present

## 2015-07-31 DIAGNOSIS — Z9221 Personal history of antineoplastic chemotherapy: Secondary | ICD-10-CM | POA: Insufficient documentation

## 2015-07-31 DIAGNOSIS — G8929 Other chronic pain: Secondary | ICD-10-CM | POA: Insufficient documentation

## 2015-07-31 DIAGNOSIS — Z79899 Other long term (current) drug therapy: Secondary | ICD-10-CM | POA: Insufficient documentation

## 2015-07-31 DIAGNOSIS — C50411 Malignant neoplasm of upper-outer quadrant of right female breast: Secondary | ICD-10-CM

## 2015-07-31 DIAGNOSIS — Z853 Personal history of malignant neoplasm of breast: Secondary | ICD-10-CM

## 2015-07-31 LAB — COMPREHENSIVE METABOLIC PANEL
ALBUMIN: 3.6 g/dL (ref 3.5–5.0)
ALT: 16 U/L (ref 14–54)
AST: 19 U/L (ref 15–41)
Alkaline Phosphatase: 65 U/L (ref 38–126)
Anion gap: 9 (ref 5–15)
BILIRUBIN TOTAL: 0.5 mg/dL (ref 0.3–1.2)
BUN: 16 mg/dL (ref 6–20)
CO2: 25 mmol/L (ref 22–32)
Calcium: 8.6 mg/dL — ABNORMAL LOW (ref 8.9–10.3)
Chloride: 102 mmol/L (ref 101–111)
Creatinine, Ser: 0.71 mg/dL (ref 0.44–1.00)
GFR calc Af Amer: >60 mL/min (ref >60)
GFR calc non Af Amer: >60 mL/min (ref >60)
GLUCOSE: 124 mg/dL — AB (ref 65–99)
POTASSIUM: 3.6 mmol/L (ref 3.5–5.1)
Sodium: 136 mmol/L (ref 135–145)
Total Protein: 6.2 g/dL — ABNORMAL LOW (ref 6.5–8.1)

## 2015-07-31 LAB — CBC WITH DIFFERENTIAL/PLATELET
BASOS ABS: 0 10*3/uL (ref 0–0.1)
BASOS PCT: 0 %
Eosinophils Absolute: 0.1 10*3/uL (ref 0–0.7)
Eosinophils Relative: 1 %
HEMATOCRIT: 37.9 % (ref 35.0–47.0)
HEMOGLOBIN: 13 g/dL (ref 12.0–16.0)
Lymphocytes Relative: 27 %
Lymphs Abs: 2.7 10*3/uL (ref 1.0–3.6)
MCH: 33 pg (ref 26.0–34.0)
MCHC: 34.2 g/dL (ref 32.0–36.0)
MCV: 96.6 fL (ref 80.0–100.0)
Monocytes Absolute: 0.9 10*3/uL (ref 0.2–0.9)
Monocytes Relative: 9 %
NEUTROS ABS: 6.3 10*3/uL (ref 1.4–6.5)
NEUTROS PCT: 63 %
Platelets: 216 10*3/uL (ref 150–440)
RBC: 3.92 MIL/uL (ref 3.80–5.20)
RDW: 13.7 % (ref 11.5–14.5)
WBC: 10.1 10*3/uL (ref 3.6–11.0)

## 2015-07-31 MED ORDER — VARENICLINE TARTRATE 0.5 MG X 11 & 1 MG X 42 PO MISC: ORAL | Status: DC

## 2015-07-31 NOTE — Progress Notes (Signed)
Radiation Oncology Follow up Note  Name: Amy Leonard   Date:   07/31/2015 MRN:  [id number] DOB: Jan 22, 1961    This 54 y.o. female presents to the clinic today for follow-up for breast cancer stage I triple negative ER/PR positive now out 2 years.  REFERRING PROVIDER: Tower, Wynelle Fanny, MD  HPI: Patient is a 54 year old female now out 2 years having completed bilateral breast radiation right breast was a T1c triple negative invasive carcinoma left breast was a stage I T1 be N1 ER/PR positive invasive mammary carcinoma status post adjuvant chemotherapy Cytoxan and Adriamycin and Taxol. She is seen today in routine follow-up and is doing well. She is on anastrozole tolerating that well by mouth. She is doing well she specifically denies breast tenderness cough or bone pain her follow-up mammograms have been fine.  . COMPLICATIONS OF TREATMENT: none  FOLLOW UP COMPLIANCE: keeps appointments   PHYSICAL EXAM:  BP 110/78 mmHg  Pulse 80  Temp(Src) 97.8 F (36.6 C)  Wt 170 lb 11.9 oz (77.45 kg) Lungs are clear to A&P cardiac examination essentially unremarkable with regular rate and rhythm. No dominant mass or nodularity is noted in either breast in 2 positions examined. Incision is well-healed. No axillary or supraclavicular adenopathy is appreciated. Cosmetic result is excellent. Well-developed well-nourished patient in NAD. HEENT reveals PERLA, EOMI, discs not visualized.  Oral cavity is clear. No oral mucosal lesions are identified. Neck is clear without evidence of cervical or supraclavicular adenopathy. Lungs are clear to A&P. Cardiac examination is essentially unremarkable with regular rate and rhythm without murmur rub or thrill. Abdomen is benign with no organomegaly or masses noted. Motor sensory and DTR levels are equal and symmetric in the upper and lower extremities. Cranial nerves II through XII are grossly intact. Proprioception is intact. No peripheral adenopathy or edema is  identified. No motor or sensory levels are noted. Crude visual fields are within normal range.  RADIOLOGY RESULTS: Bilateral mammograms back in April 2016 were fine 1 year follow-up suggested. Bone density test recently was normal  PLAN: Present time she is doing well with no evidence of disease. I have asked to see her back in 1 year for follow-up. She knows to call sooner with any concerns. She continues close follow-up care with medical oncology.  I would like to take this opportunity for allowing me to participate in the care of your patient.Armstead Peaks., MD

## 2015-07-31 NOTE — Progress Notes (Signed)
[place] @ [facility] [phone]  Fax:([phone]     Amy Leonard OB: 1960/11/20  MR#: [id number]  ZJI#:[id number]  Patient Care Team: Abner Greenspan, MD as PCP - General  CHIEF COMPLAINT: Oncology history Chief Complaint/Diagnosis:   54 year old female with bilateral breast cancer status post  Right breast wide local excision and axillary node dissection for a T1 C. N0 M0 stage I triple negative invasive ductal carcinoma  Left breast status post wide local excision and sentinel node biopsy for a stage I (T1 B. N1 MI M0) ER/PR positive invasive memory carcinoma status post adjuvant chemotherapy at outside institution patient received Cytoxan Adriamycin x4 followed by Taxol which was later on  carboplatin and gemcitabine. patient has finished bilateral radiation  therapy.  started on anastrozole 1 mg by mouth daily since  January of 2015  Oncology Flowsheet 04/28/2013 04/29/2013 05/12/2013 05/13/2013 05/26/2013 05/27/2013 08/25/2013  Day, Cycle Day 1, Cycle 1 Day 2, Cycle 1 Day 1, Cycle 2 Day 2, Cycle 2 Day 1, Cycle 3 Day 2, Cycle 3 -  CARBOplatin (PARAPLATIN) IV 300 mg - 300 mg - 300 mg - -  cyclophosphamide (CYTOXAN) IV - - - - - - -  dexamethasone (DECADRON) IJ - - - - - - -  Dexamethasone Sodium Phosphate (DECADRON) IV 10 mg - 10 mg - 10 mg - -  diazepam (VALIUM) PO - - - - - - -  DOXOrubicin (ADRIAMYCIN) IV - - - - - - -  filgrastim (NEUPOGEN) [place] - - - - - - -  fosaprepitant (EMEND) IV - - - - - - -  gemcitabine (GEMZAR) IV 800 mg/m2 - 800 mg/m2 - 800 mg/m2 - -  LORazepam (ATIVAN) IV - - - - - - -  LORazepam (ATIVAN) PO - - - - - - -  ondansetron (ZOFRAN) IJ - - - - - - -  ondansetron (ZOFRAN) IV 8 mg - 8 mg - 8 mg - -  ondansetron (ZOFRAN) IV - - - - - - -  ondansetron (ZOFRAN) IV - - - - - - -  PACLitaxel (TAXOL) IV - - - - - - -  palonosetron (ALOXI) IV - - - - - - -  pegfilgrastim (NEULASTA) [place] - 6 mg - 6 mg - 6 mg -    INTERVAL HISTORY:  54 year old  lady with bilateral carcinoma of breasts here for further follow-up and treatment consideration.  Taking anastrozole tolerating very well taking calcium and vitamin D had a bone density study and mammogram No bony pain no bony fractures He is to smoke Last mammogram was in April 2016 Patient is continuing to anastrozole No bony pain no bony fracture.  Patient continues to smoke REVIEW OF SYSTEMS:   GENERAL:  Feels good.  Active.  No fevers, sweats or weight loss. PERFORMANCE STATUS (ECOG01 HEENT:  No visual changes, runny nose, sore throat, mouth sores or tenderness. Lungs: No shortness of breath or cough.  No hemoptysis. Cardiac:  No chest pain, palpitations, orthopnea, or PND. GI:  No nausea, vomiting, diarrhea, constipation, melena or hematochezia. GU:  No urgency, frequency, dysuria, or hematuria. Musculoskeletal:  No back pain.  No joint pain.  No muscle tenderness. Extremities:  No pain or swelling. Skin:  No rashes or skin changes. Neuro:  No headache, numbness or weakness, balance or coordination issues. Endocrine:  No diabetes, thyroid issues, hot flashes or night sweats. Psych:  No mood changes, depression or anxiety. Pain:  No focal  pain. Review of systems:  All other systems reviewed and found to be negative. As per HPI. Otherwise, a complete review of systems is negatve.  PAST MEDICAL HISTORY: Past Medical History  Diagnosis Date  . Elevated WBC count     nl diff  . Chronic back pain     spinal stenosis  . Tobacco abuse   . Anxiety and depression   . HSV infection     recurrent (side and buttox)  . Migraine   . Glaucoma     ?  . Carpal tunnel syndrome   . HLD (hyperlipidemia)   . Folliculitis 11/30/5679  . Anxiety   . Depression   . Basal ganglia infarction ([place]) 04/17/2012  . GERD (gastroesophageal reflux disease)   . Hypertension   . Breast cancer ([place])   . Wears dentures     top  . Erosive gastritis   . Barrett's esophageal ulceration   . Cancer of  breast ([place]) 01/31/2015    PAST SURGICAL HISTORY: Past Surgical History  Procedure Laterality Date  . Btl    . Epidural steroid injection  2008  . Colonoscopy    . Upper gi endoscopy    . Partial mastectomy with needle localization and axillary sentinel lymph node bx Bilateral 11/23/2012    Procedure: PARTIAL MASTECTOMY WITH NEEDLE LOCALIZATION AND AXILLARY SENTINEL LYMPH NODE BX;  Surgeon: Adin Hector, MD;  Location: [place];  Service: General;  Laterality: Bilateral;  . Portacath placement Right 11/23/2012    Procedure: INSERTION PORT-A-CATH;  Surgeon: Adin Hector, MD;  Location: [place];  Service: General;  Laterality: Right;  . Port-a-cath removal Right 08/25/2013    Procedure: REMOVAL PORT-A-CATH;  Surgeon: Adin Hector, MD;  Location: [place];  Service: General;  Laterality: Right;    FAMILY HISTORY Family History  Problem Relation Age of Onset  . Stroke Maternal Grandmother   . Diabetes Maternal Grandmother   . Diabetes Mother   . Hypertension Mother   . Leukemia Mother   . Obesity Mother   . Depression Sister   . Bipolar disorder Son     Bipolar / oppositional defiant  . Alcohol abuse Sister   . Drug abuse Sister   . Alcohol abuse Sister   . Drug abuse Sister   . Colon cancer Neg Hx   . Alcohol abuse Brother   . Drug abuse Brother     ADVANCED DIRECTIVES:  No flowsheet data found.  HEALTH MAINTENANCE: Social History  Substance Use Topics  . Smoking status: Current Every Day Smoker -- 1.00 packs/day for 33 years    Types: Cigarettes  . Smokeless tobacco: Never Used     Comment: 1 1/2 ppd -form given 10-07-12  . Alcohol Use: 0.0 oz/week    0 Standard drinks or equivalent per week     Comment: beer rarely      Allergies  Allergen Reactions  . Bupropion Hcl Nausea Only and Other (See Comments)     GI, sleepy  . Chlorhexidine Gluconate Itching and Rash  . Hydrocod Polst-Cpm Polst Er Nausea  And Vomiting    vomiting  . Lisinopril Other (See Comments)    Leg cramps    . Naproxen Sodium Other (See Comments)    does not tolerate    Current Outpatient Prescriptions  Medication Sig Dispense Refill  . Acetaminophen (TYLENOL ARTHRITIS PAIN PO) Take 2 tablets by mouth as needed (pain).     Marland Kitchen acyclovir  ointment (ZOVIRAX) 5 % Apply 1 application topically as needed (flare-up). (Patient not taking: Reported on 07/31/2015) 15 g 1  . anastrozole (ARIMIDEX) 1 MG tablet Take 1 tablet (1 mg total) by mouth daily. 90 tablet 12  . aspirin 81 MG chewable tablet Chew 1 tablet (81 mg total) by mouth daily. 90 tablet 3  . atorvastatin (LIPITOR) 10 MG tablet Take 1 tablet (10 mg total) by mouth daily. 90 tablet 1  . b complex vitamins tablet Take 1 tablet by mouth daily.    . calcium carbonate (TUMS - DOSED IN MG ELEMENTAL CALCIUM) 500 MG chewable tablet Chew by mouth as directed.      . Calcium Carbonate-Vitamin D (CALCIUM + D PO) Take 1 tablet by mouth daily.    Marland Kitchen losartan (COZAAR) 50 MG tablet Take 1 tablet (50 mg total) by mouth daily. 90 tablet 3  . nicotine (NICODERM CQ - DOSED IN MG/24 HOURS) 21 mg/24hr patch Place 1 patch onto the skin daily. (Patient not taking: Reported on 07/31/2015) 28 patch 0  . nitroGLYCERIN (NITRO-DUR) 0.2 mg/hr patch Apply 1/4 of a patch to the affected area and change every 24 hours (re: tendinopathy) 30 patch 12  . omeprazole (PRILOSEC) 40 MG capsule Take 1 capsule (40 mg total) by mouth daily. 90 capsule 0  . pregabalin (LYRICA) 75 MG capsule Take 1 capsule (75 mg total) by mouth at bedtime. (Patient not taking: Reported on 07/31/2015) 30 capsule 1  . ranitidine (ZANTAC) 300 MG tablet Take 150 mg by mouth at bedtime.     . simethicone (MYLICON) 80 MG chewable tablet Chew 1 tablet (80 mg total) by mouth every 8 (eight) hours as needed for flatulence. 60 tablet 3  . traMADol (ULTRAM) 50 MG tablet Take 1 tablet (50 mg total) by mouth every 8 (eight) hours as needed for  moderate pain. 60 tablet 0   No current facility-administered medications for this visit.    OBJECTIVE:  There were no vitals filed for this visit.   There is no weight on file to calculate BMI.    ECOG FS:1 - Symptomatic but completely ambulatory  PHYSICAL EXAM: General  status: Performance status is good.  Patient has not lost significant weight HEENT: No evidence of stomatitis. Sclera and conjunctivae :: No jaundice.   pale looking. Lungs: Air  entry equal on both sides.  No rhonchi.  No rales.  Cardiac: Heart sounds are normal.  No pericardial rub.  No murmur. Lymphatic system: Cervical, axillary, inguinal, lymph nodes not palpable GI: Abdomen is soft.  No ascites.  Liver spleen not palpable.  No tenderness.  Bowel sounds are within normal limit Lower extremity: No edema Neurological system: Higher functions, cranial nerves intact no evidence of peripheral neuropathy. Skin: No rash.  No ecchymosis..  Examination of both breast within normal limit.  No palpable masses.  Left breast in the upper and outer quadrant there is induration at the previous scar of the placement LAB RESULTS:  Appointment on 07/31/2015  Component Date Value Ref Range Status  . WBC 07/31/2015 10.1  3.6 - 11.0 K/uL Final  . RBC 07/31/2015 3.92  3.80 - 5.20 MIL/uL Final  . Hemoglobin 07/31/2015 13.0  12.0 - 16.0 g/dL Final  . HCT 07/31/2015 37.9  35.0 - 47.0 % Final  . MCV 07/31/2015 96.6  80.0 - 100.0 fL Final  . MCH 07/31/2015 33.0  26.0 - 34.0 pg Final  . MCHC 07/31/2015 34.2  32.0 - 36.0 g/dL Final  .  RDW 07/31/2015 13.7  11.5 - 14.5 % Final  . Platelets 07/31/2015 216  150 - 440 K/uL Final  . Neutrophils Relative % 07/31/2015 63   Final  . Neutro Abs 07/31/2015 6.3  1.4 - 6.5 K/uL Final  . Lymphocytes Relative 07/31/2015 27   Final  . Lymphs Abs 07/31/2015 2.7  1.0 - 3.6 K/uL Final  . Monocytes Relative 07/31/2015 9   Final  . Monocytes Absolute 07/31/2015 0.9  0.2 - 0.9 K/uL Final  . Eosinophils  Relative 07/31/2015 1   Final  . Eosinophils Absolute 07/31/2015 0.1  0 - 0.7 K/uL Final  . Basophils Relative 07/31/2015 0   Final  . Basophils Absolute 07/31/2015 0.0  0 - 0.1 K/uL Final  . Sodium 07/31/2015 136  135 - 145 mmol/L Final  . Potassium 07/31/2015 3.6  3.5 - 5.1 mmol/L Final  . Chloride 07/31/2015 102  101 - 111 mmol/L Final  . CO2 07/31/2015 25  22 - 32 mmol/L Final  . Glucose, Bld 07/31/2015 124* 65 - 99 mg/dL Final  . BUN 07/31/2015 16  6 - 20 mg/dL Final  . Creatinine, Ser 07/31/2015 0.71  0.44 - 1.00 mg/dL Final  . Calcium 07/31/2015 8.6* 8.9 - 10.3 mg/dL Final  . Total Protein 07/31/2015 6.2* 6.5 - 8.1 g/dL Final  . Albumin 07/31/2015 3.6  3.5 - 5.0 g/dL Final  . AST 07/31/2015 19  15 - 41 U/L Final  . ALT 07/31/2015 16  14 - 54 U/L Final  . Alkaline Phosphatase 07/31/2015 65  38 - 126 U/L Final  . Total Bilirubin 07/31/2015 0.5  0.3 - 1.2 mg/dL Final  . GFR calc non Af Amer 07/31/2015 >60  >60 mL/min Final  . GFR calc Af Amer 07/31/2015 >60  >60 mL/min Final   Comment: (NOTE) The eGFR has been calculated using the CKD EPI equation. This calculation has not been validated in all clinical situations. eGFR's persistently <60 mL/min signify possible Chronic Kidney Disease.   . Anion gap 07/31/2015 9  5 - 15 Final     STUDIES: No results found.  ASSESSMENT: Bilateral carcinoma of breast.  Status post chemotherapy. On Arimidex  Repeat mammogram Patient has been counseled regarding smoking in prescription for Chantix was given MEDICAL DECISION MAKING:  All lab data has been reviewed there is no evidence of recurrent or progressive disease. Bone density study has been reviewed there is no evidence of osteoporosis or osteopenia  Repeat mammogram Continue anastrozole   patient continues to smoke  she was given information about quitting smoking    Patient expressed understanding and was in agreement with this plan. She also understands that She can call  clinic at any time with any questions, concerns, or complaints.    Cancer of breast   Staging form: Breast, AJCC 7th Edition     Clinical: Stage IA (T1c, N0, M0) - Amy Griffon, MD   07/31/2015 10:11 AM

## 2015-09-02 ENCOUNTER — Other Ambulatory Visit: Payer: Self-pay | Admitting: Family Medicine

## 2015-09-06 ENCOUNTER — Telehealth: Payer: Self-pay | Admitting: *Deleted

## 2015-09-06 MED ORDER — VARENICLINE TARTRATE 1 MG PO TABS: 1.0000 mg | ORAL_TABLET | Freq: Two times a day (BID) | ORAL | Status: DC

## 2015-09-06 NOTE — Telephone Encounter (Signed)
Escribed

## 2015-09-22 ENCOUNTER — Ambulatory Visit (INDEPENDENT_AMBULATORY_CARE_PROVIDER_SITE_OTHER): Payer: BLUE CROSS/BLUE SHIELD | Admitting: Family Medicine

## 2015-09-22 ENCOUNTER — Encounter: Payer: Self-pay | Admitting: Family Medicine

## 2015-09-22 VITALS — BP 112/68 | HR 82 | Temp 98.8°F | Wt 170.0 lb

## 2015-09-22 DIAGNOSIS — F17200 Nicotine dependence, unspecified, uncomplicated: Secondary | ICD-10-CM

## 2015-09-22 DIAGNOSIS — T887XXA Unspecified adverse effect of drug or medicament, initial encounter: Secondary | ICD-10-CM

## 2015-09-22 DIAGNOSIS — T50905A Adverse effect of unspecified drugs, medicaments and biological substances, initial encounter: Secondary | ICD-10-CM | POA: Insufficient documentation

## 2015-09-22 NOTE — Progress Notes (Signed)
Pre visit review using our clinic review tool, if applicable. No additional management support is needed unless otherwise documented below in the visit note. 

## 2015-09-22 NOTE — Patient Instructions (Addendum)
If the side effects of Chantix (it may be causing your fatigue and general weakness) are not worth the benefit-go ahead and stop it  You have to be ready to fully quit  Transitioning to nicotine replacement - like a patch is an excellent first step (with or without the chantix)  I'm glad your stress is improved a bit  Check in with work and see what they offer in terms of smoking cessation products If you end up needing a prescription -please let me know   If you stop the chantix and continue to feel this way- we need to look further

## 2015-09-22 NOTE — Progress Notes (Signed)
Subjective:    Patient ID: Amy Leonard, female    DOB: 1961-04-26, 55 y.o.   MRN: [id number]  HPI Here with fatigue  Really wiped out and wants to sleep all the time   Energy level has dropped tremendously  Thinks it be from the chantix   Also more gas  A little nausea right after she takes it   Has cut back but she has not quit (just not ready)  She is determined to quit Wants to quit smoking so she can have her back surgery   She purchased a vap cig - has used a 12 mg version  pref the e -cig   Still buying regular cig Had 1/2 cig last night-none since   Thinks she can continue the chantix - can tolerate the side effects   Patient Active Problem List   Diagnosis Date Noted  . Adverse effect of drug/medicinal 09/22/2015  . Encounter for routine gynecological examination 02/10/2015  . Routine general medical examination at a health care facility 02/07/2015  . Cancer of breast ([place]) 01/31/2015  . Degenerative disc disease, lumbar 02/04/2014  . Anterolisthesis 02/04/2014  . Low back pain 02/02/2014  . Abnormal urinalysis 02/02/2014  . Postmenopausal estrogen deficiency 09/02/2013  . Febrile neutropenia ([place]) 01/06/2013  . Breast cancer ([place]) 01/06/2013  . Anemia of chronic disease 01/06/2013  . Thrombocytopenia, unspecified ([place]) 01/06/2013  . Lacunar infarction ([place]) 04/17/2012  . 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 Pure hypercholesterolemia 04/05/2008  . Essential hypertension 03/30/2008  . ANXIETY DEPRESSION 11/17/2007  . TOBACCO USE 11/17/2007  . BACK PAIN, CHRONIC 09/23/2007   Past Medical History  Diagnosis Date  . Elevated WBC count     nl diff  . Chronic back pain     spinal stenosis  . Tobacco abuse   . Anxiety and depression   . HSV infection     recurrent (side and buttox)  . Migraine   . Glaucoma     ?  . Carpal tunnel syndrome   . HLD (hyperlipidemia)   . Folliculitis [id number]  . Anxiety   . Depression   . Basal ganglia  infarction ([place]) 04/17/2012  . GERD (gastroesophageal reflux disease)   . Hypertension   . Breast cancer ([place])   . Wears dentures     top  . Erosive gastritis   . Barrett's esophageal ulceration   . Cancer of breast ([place]) 01/31/2015   Past Surgical History  Procedure Laterality Date  . Btl    . Epidural steroid injection  2008  . Colonoscopy    . Upper gi endoscopy    . Partial mastectomy with needle localization and axillary sentinel lymph node bx Bilateral 11/23/2012    Procedure: PARTIAL MASTECTOMY WITH NEEDLE LOCALIZATION AND AXILLARY SENTINEL LYMPH NODE BX;  Surgeon: Adin Hector, MD;  Location: [facility];  Service: General;  Laterality: Bilateral;  . Portacath placement Right 11/23/2012    Procedure: INSERTION PORT-A-CATH;  Surgeon: Adin Hector, MD;  Location: [facility];  Service: General;  Laterality: Right;  . Port-a-cath removal Right 08/25/2013    Procedure: REMOVAL PORT-A-CATH;  Surgeon: Adin Hector, MD;  Location: [facility];  Service: General;  Laterality: Right;   Social History  Substance Use Topics  . Smoking status: Current Every Day Smoker -- 0.50 packs/day for 33 years    Types: Cigarettes  . Smokeless tobacco: Never Used     Comment: 1 1/2 ppd -  form given 10-07-12  . Alcohol Use: 0.0 oz/week    0 Standard drinks or equivalent per week     Comment: beer rarely   Family History  Problem Relation Age of Onset  . Stroke Maternal Grandmother   . Diabetes Maternal Grandmother   . Diabetes Mother   . Hypertension Mother   . Leukemia Mother   . Obesity Mother   . Depression Sister   . Bipolar disorder Son     Bipolar / oppositional defiant  . Alcohol abuse Sister   . Drug abuse Sister   . Alcohol abuse Sister   . Drug abuse Sister   . Colon cancer Neg Hx   . Alcohol abuse Brother   . Drug abuse Brother    Allergies  Allergen Reactions  . Bupropion Hcl Nausea Only and Other (See Comments)     GI,  sleepy  . Chlorhexidine Gluconate Itching and Rash  . Hydrocod Polst-Cpm Polst Er Nausea And Vomiting    vomiting  . Lisinopril Other (See Comments)    Leg cramps    . Naproxen Sodium Other (See Comments)    does not tolerate   Current Outpatient Prescriptions on File Prior to Visit  Medication Sig Dispense Refill  . Acetaminophen (TYLENOL ARTHRITIS PAIN PO) Take 2 tablets by mouth as needed (pain).     Marland Kitchen acyclovir ointment (ZOVIRAX) 5 % Apply 1 application topically as needed (flare-up). 15 g 1  . anastrozole (ARIMIDEX) 1 MG tablet Take 1 tablet (1 mg total) by mouth daily. 90 tablet 12  . aspirin 81 MG chewable tablet Chew 1 tablet (81 mg total) by mouth daily. 90 tablet 3  . atorvastatin (LIPITOR) 10 MG tablet TAKE 1 TABLET BY MOUTH EVERY DAY 90 tablet 1  . b complex vitamins tablet Take 1 tablet by mouth daily.    . calcium carbonate (TUMS - DOSED IN MG ELEMENTAL CALCIUM) 500 MG chewable tablet Chew by mouth as directed.      . Calcium Carbonate-Vitamin D (CALCIUM + D PO) Take 1 tablet by mouth daily.    Marland Kitchen losartan (COZAAR) 50 MG tablet Take 1 tablet (50 mg total) by mouth daily. 90 tablet 3  . nicotine (NICODERM CQ - DOSED IN MG/24 HOURS) 21 mg/24hr patch Place 1 patch onto the skin daily. 28 patch 0  . nitroGLYCERIN (NITRO-DUR) 0.2 mg/hr patch Apply 1/4 of a patch to the affected area and change every 24 hours (re: tendinopathy) 30 patch 12  . omeprazole (PRILOSEC) 40 MG capsule Take 1 capsule (40 mg total) by mouth daily. 90 capsule 0  . pregabalin (LYRICA) 75 MG capsule Take 1 capsule (75 mg total) by mouth at bedtime. 30 capsule 1  . ranitidine (ZANTAC) 300 MG tablet Take 150 mg by mouth at bedtime.     . simethicone (MYLICON) 80 MG chewable tablet Chew 1 tablet (80 mg total) by mouth every 8 (eight) hours as needed for flatulence. 60 tablet 3  . traMADol (ULTRAM) 50 MG tablet Take 1 tablet (50 mg total) by mouth every 8 (eight) hours as needed for moderate pain. 60 tablet 0  .  varenicline (CHANTIX) 1 MG tablet Take 1 tablet (1 mg total) by mouth 2 (two) times daily. 60 tablet 1   No current facility-administered medications on file prior to visit.    Review of Systems    Review of Systems  Constitutional: Negative for fever, appetite change,  and unexpected weight change.  Eyes: Negative for pain and  visual disturbance.  Respiratory: Negative for cough and shortness of breath.   Cardiovascular: Negative for cp or palpitations    Gastrointestinal: Negative for  diarrhea and constipation. pos for nausea and bloating  Genitourinary: Negative for urgency and frequency.  Skin: Negative for pallor or rash   Neurological: Negative for weakness, light-headedness, numbness and headaches.  Hematological: Negative for adenopathy. Does not bruise/bleed easily.  Psychiatric/Behavioral: Negative for dysphoric mood. The patient is  nervous/anxious.      Objective:   Physical Exam  Constitutional: She appears well-developed and well-nourished. No distress.  obese and well appearing   HENT:  Head: Normocephalic and atraumatic.  Mouth/Throat: Oropharynx is clear and moist.  Eyes: Conjunctivae and EOM are normal. Pupils are equal, round, and reactive to light.  Neck: Normal range of motion. Neck supple. No JVD present. Carotid bruit is not present. No thyromegaly present.  Cardiovascular: Normal rate, regular rhythm, normal heart sounds and intact distal pulses.  Exam reveals no gallop.   Pulmonary/Chest: Effort normal and breath sounds normal. No respiratory distress. She has no wheezes. She has no rales.  No crackles  Diffusely distant bs   Abdominal: Soft. Bowel sounds are normal. She exhibits no distension, no abdominal bruit and no mass. There is no tenderness.  Musculoskeletal: She exhibits no edema.  Lymphadenopathy:    She has no cervical adenopathy.  Neurological: She is alert. She has normal reflexes.  Skin: Skin is warm and dry. No rash noted. No pallor.    Psychiatric: She has a normal mood and affect.  Disc stressors today          Assessment & Plan:   Problem List Items Addressed This Visit      Other   Adverse effect of drug/medicinal - Primary    I suspect chantix may be causing her fatigue and bloating  Will not know unless she tries going off off  it briefly  Rev most recent labs Also stressors  Disc alt methods of smoking cessation        TOBACCO USE    Long disc re: smoking Disc in detail risks of smoking and possible outcomes including copd, vascular/ heart disease, cancer , respiratory and sinus infections  Pt voices understanding She is in the process of quitting but not fully stopped (unsure if her desire is enough)  May be having chantix side eff (fatigue/bloating)- unsure if tolerable - she may need trial off of it to see if this is the cause Disc nicotine replacement -would like to try the patch Thinks she may be able to get this covered at work  Will update

## 2015-09-24 NOTE — Assessment & Plan Note (Signed)
I suspect chantix may be causing her fatigue and bloating  Will not know unless she tries going off off  it briefly  Rev most recent labs Also stressors  Disc alt methods of smoking cessation

## 2015-09-24 NOTE — Assessment & Plan Note (Addendum)
Long disc re: smoking Disc in detail risks of smoking and possible outcomes including copd, vascular/ heart disease, cancer , respiratory and sinus infections  Pt voices understanding She is in the process of quitting but not fully stopped (unsure if her desire is enough)  May be having chantix side eff (fatigue/bloating)- unsure if tolerable - she may need trial off of it to see if this is the cause Disc nicotine replacement -would like to try the patch Thinks she may be able to get this covered at work  Will update  20 minutes spent counseling pt on smoking cessation today

## 2015-10-02 ENCOUNTER — Telehealth: Payer: Self-pay | Admitting: *Deleted

## 2015-10-02 NOTE — Telephone Encounter (Signed)
Received referral for low dose lung cancer screening CT scan from [facility]. Voicemail left at phone number listed in EMR for patient to call me back to facilitate scheduling scan.

## 2015-10-20 ENCOUNTER — Telehealth: Payer: Self-pay

## 2015-10-20 MED ORDER — NICOTINE 14 MG/24HR TD PT24: 14.0000 mg | MEDICATED_PATCH | Freq: Every day | TRANSDERMAL | Status: DC

## 2015-10-20 NOTE — Telephone Encounter (Signed)
Pt notified of Dr. Tower's comments and verbalized understanding  

## 2015-10-20 NOTE — Telephone Encounter (Signed)
Pt says her insurance pays for a lot of smoking cessation meds so she would like the Rx be sent to [facility] [place] said if they are to expensive she will just check the price of the OTC patches. Pt said on average she is smoking about 12-15 cigarettes at day

## 2015-10-20 NOTE — Telephone Encounter (Signed)
Pt left v/m; pt was seen 09/22/15; pt wanting to get patches rx to stop smoking to [place]. Pt request cb.

## 2015-10-20 NOTE — Telephone Encounter (Signed)
She can buy these otc but if ins pays I will certainly px them!    How many cig per day is she smoking now ?

## 2015-10-20 NOTE — Telephone Encounter (Signed)
Px for nicotine patch to [facility] [address]  Do not smoke with the patch on Good luck  Let me know when you are ready to go down to the 7 mg

## 2015-10-23 ENCOUNTER — Ambulatory Visit (INDEPENDENT_AMBULATORY_CARE_PROVIDER_SITE_OTHER): Payer: BLUE CROSS/BLUE SHIELD | Admitting: Family Medicine

## 2015-10-23 ENCOUNTER — Encounter: Payer: Self-pay | Admitting: Family Medicine

## 2015-10-23 VITALS — BP 110/76 | HR 82 | Temp 98.4°F | Ht 62.75 in | Wt 174.0 lb

## 2015-10-23 DIAGNOSIS — F17200 Nicotine dependence, unspecified, uncomplicated: Secondary | ICD-10-CM | POA: Diagnosis not present

## 2015-10-23 DIAGNOSIS — M79601 Pain in right arm: Secondary | ICD-10-CM

## 2015-10-23 DIAGNOSIS — I890 Lymphedema, not elsewhere classified: Secondary | ICD-10-CM | POA: Diagnosis not present

## 2015-10-23 DIAGNOSIS — G5600 Carpal tunnel syndrome, unspecified upper limb: Secondary | ICD-10-CM | POA: Insufficient documentation

## 2015-10-23 DIAGNOSIS — G5601 Carpal tunnel syndrome, right upper limb: Secondary | ICD-10-CM

## 2015-10-23 MED ORDER — PREDNISONE 10 MG PO TABS
ORAL_TABLET | ORAL | Status: DC
Start: 2015-10-23 — End: 2016-03-01

## 2015-10-23 NOTE — Progress Notes (Signed)
Pre visit review using our clinic review tool, if applicable. No additional management support is needed unless otherwise documented below in the visit note. 

## 2015-10-23 NOTE — Assessment & Plan Note (Signed)
From lymphedema-likely exac carpal tunnel See other assessments Sleeve/wrist splint/prednisone taper and relative rest Update if not starting to improve in a week or if worsening

## 2015-10-23 NOTE — Assessment & Plan Note (Signed)
Worse after heavy work at work with that arm and exac her carpal tunnel Avoiding nsaid due to hx of cva  Will continue lymphedema sleeve/ avoid heavy work and reped motion of that arm for now  Continue to follow  pred taper for pain and carpal tunnel

## 2015-10-23 NOTE — Patient Instructions (Signed)
Instead of ibuprofen -take the prednisone taper  Wear your lymphedema arm sleeve when working Wear whichever wrist splint that feels the best - at night when sleeping  Bio freeze is ok also Update if not starting to improve in a week or if worsening     Try not to overdo it with the right arm   Start the nicotine patches when you are ready

## 2015-10-23 NOTE — Assessment & Plan Note (Signed)
Suspect lymphedema in R arm (s/p breast cancer surgery) is causing inc pressure on radial and ulnar nerves Suggest wearing lymphedema sleeve when working  Relative rest R arm when able -minimize reped motion pred taper  Reassuring exam Wear wrist splint for sleeping Update if not starting to improve in a week or if worsening

## 2015-10-23 NOTE — Progress Notes (Signed)
Subjective:    Patient ID: Amy Leonard, female    DOB: 25-Apr-1961, 55 y.o.   MRN: [id number]  HPI Here for arm pain   Wt is up 4 lb with bmi of 31  2 weeks ago - she had to work extra hours to cover someone  Lifting cans/hitting lids (paint cans) R arm hurts ever since (R hand dominant) Forearm feels swollen Inside of arm is tender and bruised feeling Wrist hurts (actually both) - feels it to turn steering wheel  ? Wonders about lymphedema  At risk for that after R mastectomy - took 59 LN out of that axilla  She has a lymphedema sleeve- does not always wear it  Also wearing a wrist brace  Hx of carpal tunnel  Never had surgery   Chronic back pain - is hoping to get est with a surgery   Has not started nicotine patch- has it to pick up - excited to quit smoking so she can have back surgery  Chronic low back pain and R leg pain   Not taking any med for arm pain  occ takes tramadol for her back pain  Ibuprofen helps a bit - avoids if she can Uses ice packs on her back  No longer takes lyrica -made her feel too sleepy   Patient Active Problem List   Diagnosis Date Noted  . Lymphedema 10/23/2015  . Carpal tunnel syndrome 10/23/2015  . Right arm pain 10/23/2015  . Adverse effect of drug/medicinal 09/22/2015  . Encounter for routine gynecological examination 02/10/2015  . Routine general medical examination at a health care facility 02/07/2015  . Cancer of breast ([place]) 01/31/2015  . Degenerative disc disease, lumbar 02/04/2014  . Anterolisthesis 02/04/2014  . Low back pain 02/02/2014  . Abnormal urinalysis 02/02/2014  . Postmenopausal estrogen deficiency 09/02/2013  . Febrile neutropenia ([place]) 01/06/2013  . Breast cancer ([place]) 01/06/2013  . Anemia of chronic disease 01/06/2013  . Thrombocytopenia, unspecified ([place]) 01/06/2013  . Lacunar infarction ([place]) 04/17/2012  . 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 Pure hypercholesterolemia 04/05/2008  . Essential  hypertension 03/30/2008  . ANXIETY DEPRESSION 11/17/2007  . TOBACCO USE 11/17/2007  . BACK PAIN, CHRONIC 09/23/2007   Past Medical History  Diagnosis Date  . Elevated WBC count     nl diff  . Chronic back pain     spinal stenosis  . Tobacco abuse   . Anxiety and depression   . HSV infection     recurrent (side and buttox)  . Migraine   . Glaucoma     ?  . Carpal tunnel syndrome   . HLD (hyperlipidemia)   . Folliculitis [id number]  . Anxiety   . Depression   . 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 GERD (gastroesophageal reflux disease)   . Hypertension   . Breast cancer ([place])   . Wears dentures     top  . Erosive gastritis   . Barrett's esophageal ulceration   . Cancer of breast ([place]) 01/31/2015   Past Surgical History  Procedure Laterality Date  . Btl    . Epidural steroid injection  2008  . Colonoscopy    . Upper gi endoscopy    . Partial mastectomy with needle localization and axillary sentinel lymph node bx Bilateral 11/23/2012    Procedure: PARTIAL MASTECTOMY WITH NEEDLE LOCALIZATION AND AXILLARY SENTINEL LYMPH NODE BX;  Surgeon: Adin Hector, MD;  Location: [facility];  Service: General;  Laterality: Bilateral;  .  Portacath placement Right 11/23/2012    Procedure: INSERTION PORT-A-CATH;  Surgeon: Adin Hector, MD;  Location: [facility];  Service: General;  Laterality: Right;  . Port-a-cath removal Right 08/25/2013    Procedure: REMOVAL PORT-A-CATH;  Surgeon: Adin Hector, MD;  Location: [facility];  Service: General;  Laterality: Right;   Social History  Substance Use Topics  . Smoking status: Current Every Day Smoker -- 0.50 packs/day for 33 years    Types: Cigarettes  . Smokeless tobacco: Never Used     Comment: 1 1/2 ppd -form given 10-07-12  . Alcohol Use: 0.0 oz/week    0 Standard drinks or equivalent per week     Comment: beer rarely   Family History  Problem Relation Age of Onset  . Stroke  Maternal Grandmother   . Diabetes Maternal Grandmother   . Diabetes Mother   . Hypertension Mother   . Leukemia Mother   . Obesity Mother   . Depression Sister   . Bipolar disorder Son     Bipolar / oppositional defiant  . Alcohol abuse Sister   . Drug abuse Sister   . Alcohol abuse Sister   . Drug abuse Sister   . Colon cancer Neg Hx   . Alcohol abuse Brother   . Drug abuse Brother    Allergies  Allergen Reactions  . Lyrica [Pregabalin] Other (See Comments)    sedated  . Bupropion Hcl Nausea Only and Other (See Comments)     GI, sleepy  . Chlorhexidine Gluconate Itching and Rash  . Hydrocod Polst-Cpm Polst Er Nausea And Vomiting    vomiting  . Lisinopril Other (See Comments)    Leg cramps    . Naproxen Sodium Other (See Comments)    does not tolerate   Current Outpatient Prescriptions on File Prior to Visit  Medication Sig Dispense Refill  . Acetaminophen (TYLENOL ARTHRITIS PAIN PO) Take 2 tablets by mouth as needed (pain).     Marland Kitchen acyclovir ointment (ZOVIRAX) 5 % Apply 1 application topically as needed (flare-up). 15 g 1  . anastrozole (ARIMIDEX) 1 MG tablet Take 1 tablet (1 mg total) by mouth daily. 90 tablet 12  . aspirin 81 MG chewable tablet Chew 1 tablet (81 mg total) by mouth daily. 90 tablet 3  . atorvastatin (LIPITOR) 10 MG tablet TAKE 1 TABLET BY MOUTH EVERY DAY 90 tablet 1  . b complex vitamins tablet Take 1 tablet by mouth daily.    . calcium carbonate (TUMS - DOSED IN MG ELEMENTAL CALCIUM) 500 MG chewable tablet Chew by mouth as directed.      . Calcium Carbonate-Vitamin D (CALCIUM + D PO) Take 1 tablet by mouth daily.    Marland Kitchen losartan (COZAAR) 50 MG tablet Take 1 tablet (50 mg total) by mouth daily. 90 tablet 3  . nicotine (NICODERM CQ) 14 mg/24hr patch Place 1 patch (14 mg total) onto the skin daily. 28 patch 3  . nitroGLYCERIN (NITRO-DUR) 0.2 mg/hr patch Apply 1/4 of a patch to the affected area and change every 24 hours (re: tendinopathy) 30 patch 12  .  omeprazole (PRILOSEC) 40 MG capsule Take 1 capsule (40 mg total) by mouth daily. 90 capsule 0  . ranitidine (ZANTAC) 300 MG tablet Take 150 mg by mouth at bedtime.     . simethicone (MYLICON) 80 MG chewable tablet Chew 1 tablet (80 mg total) by mouth every 8 (eight) hours as needed for flatulence. 60 tablet 3  No current facility-administered medications on file prior to visit.      Review of Systems Review of Systems  Constitutional: Negative for fever, appetite change, fatigue and unexpected weight change.  Eyes: Negative for pain and visual disturbance.  Respiratory: Negative for cough and shortness of breath.   Cardiovascular: Negative for cp or palpitations    Gastrointestinal: Negative for nausea, diarrhea and constipation.  Genitourinary: Negative for urgency and frequency.  Skin: Negative for pallor or rash   MSK pos for R arm pain and swelling and also chronic back pain Neurological: Negative for weakness, light-headedness, numbness and headaches. pos for carpal tunnel worse on the R Hematological: Negative for adenopathy. Does not bruise/bleed easily.  Psychiatric/Behavioral: Negative for dysphoric mood. The patient is not nervous/anxious.         Objective:   Physical Exam  Constitutional: She appears well-developed and well-nourished. No distress.  obese and well appearing   HENT:  Head: Normocephalic and atraumatic.  Mouth/Throat: Oropharynx is clear and moist.  Eyes: Conjunctivae and EOM are normal. Pupils are equal, round, and reactive to light.  Neck: Normal range of motion. Neck supple. No JVD present. Carotid bruit is not present. No thyromegaly present.  Cardiovascular: Normal rate, regular rhythm, normal heart sounds and intact distal pulses.  Exam reveals no gallop.   Pulmonary/Chest: Effort normal and breath sounds normal. No respiratory distress. She has no wheezes. She has no rales.  Diffusely distant bs  No crackles  Abdominal: Soft. Bowel sounds are  normal. She exhibits no distension, no abdominal bruit and no mass. There is no tenderness.  Musculoskeletal: She exhibits edema and tenderness.  Mild lymphedema over ant R foream with mild soft tissue tenderness but not erythema or bruising Post tinel bilat, neg phalen  Nl rom elbow and wrist w/o bony tenderness No axillary tenderness Nl rom all UE joints   Lymphadenopathy:    She has no cervical adenopathy.  Neurological: She is alert. She has normal reflexes. She displays no atrophy. A sensory deficit is present. She exhibits normal muscle tone. Coordination normal.  Some dec in R fingers to soft touch/temp  Skin: Skin is warm and dry. No rash noted.  Psychiatric: She has a normal mood and affect.          Assessment & Plan:   Problem List Items Addressed This Visit      Nervous and Auditory   Carpal tunnel syndrome    Suspect lymphedema in R arm (s/p breast cancer surgery) is causing inc pressure on radial and ulnar nerves Suggest wearing lymphedema sleeve when working  Relative rest R arm when able -minimize reped motion pred taper  Reassuring exam Wear wrist splint for sleeping Update if not starting to improve in a week or if worsening          Other   Lymphedema - Primary    Worse after heavy work at work with that arm and exac her carpal tunnel Avoiding nsaid due to hx of cva  Will continue lymphedema sleeve/ avoid heavy work and reped motion of that arm for now  Continue to follow  pred taper for pain and carpal tunnel       Right arm pain    From lymphedema-likely exac carpal tunnel See other assessments Sleeve/wrist splint/prednisone taper and relative rest Update if not starting to improve in a week or if worsening        TOBACCO USE    Pt is almost ready to start the  quitting process Disc in detail risks of smoking and possible outcomes including copd, vascular/ heart disease, cancer , respiratory and sinus infections  Pt voices understanding Has  nicotine patch at pharmacy waiting for pick up  Disc expectations

## 2015-10-23 NOTE — Assessment & Plan Note (Signed)
Pt is almost ready to start the quitting process Disc in detail risks of smoking and possible outcomes including copd, vascular/ heart disease, cancer , respiratory and sinus infections  Pt voices understanding Has nicotine patch at pharmacy waiting for pick up  Disc expectations

## 2015-11-20 ENCOUNTER — Ambulatory Visit: Payer: BLUE CROSS/BLUE SHIELD | Attending: Oncology | Admitting: Occupational Therapy

## 2015-11-20 DIAGNOSIS — I972 Postmastectomy lymphedema syndrome: Secondary | ICD-10-CM | POA: Diagnosis present

## 2015-11-20 DIAGNOSIS — M25532 Pain in left wrist: Secondary | ICD-10-CM | POA: Insufficient documentation

## 2015-11-20 DIAGNOSIS — M25632 Stiffness of left wrist, not elsewhere classified: Secondary | ICD-10-CM | POA: Insufficient documentation

## 2015-11-20 NOTE — Patient Instructions (Signed)
Pt fitted with isotoner glove with D tubi grip to use over hand to elbow  And fitted with wrist splint  Wear if can 24 hrs - and take off  3 x day for tendon glides, wrist AROM in all planes

## 2015-11-20 NOTE — Therapy (Signed)
[facility] [address], [place], [address] Phone: [phone]   Fax:  [phone]  Occupational Therapy Treatment  Patient Details  Name: Amy Leonard MRN: [id number] Date of Birth: [birth year] Referring Provider: Oliva Bustard  Encounter Date: 11/20/2015      OT End of Session - 11/20/15 1709    Visit Number 1   Number of Visits 5   Date for OT Re-Evaluation 12/25/15   OT Start Time 1322   OT Stop Time 1407   OT Time Calculation (min) 45 min   Activity Tolerance Patient tolerated treatment well   Behavior During Therapy [facility] for tasks assessed/performed      Past Medical History  Diagnosis Date  . Elevated WBC count     nl diff  . Chronic back pain     spinal stenosis  . Tobacco abuse   . Anxiety and depression   . HSV infection     recurrent (side and buttox)  . Migraine   . Glaucoma     ?  . Carpal tunnel syndrome   . HLD (hyperlipidemia)   . Folliculitis [id number]  . Anxiety   . Depression   . 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 GERD (gastroesophageal reflux disease)   . Hypertension   . Breast cancer ([place])   . Wears dentures     top  . Erosive gastritis   . Barrett's esophageal ulceration   . Cancer of breast ([place]) 01/31/2015    Past Surgical History  Procedure Laterality Date  . Btl    . Epidural steroid injection  2008  . Colonoscopy    . Upper gi endoscopy    . Partial mastectomy with needle localization and axillary sentinel lymph node bx Bilateral 11/23/2012    Procedure: PARTIAL MASTECTOMY WITH NEEDLE LOCALIZATION AND AXILLARY SENTINEL LYMPH NODE BX;  Surgeon: Adin Hector, MD;  Location: [place];  Service: General;  Laterality: Bilateral;  . Portacath placement Right 11/23/2012    Procedure: INSERTION PORT-A-CATH;  Surgeon: Adin Hector, MD;  Location: [place];  Service: General;  Laterality: Right;  . Port-a-cath removal  Right 08/25/2013    Procedure: REMOVAL PORT-A-CATH;  Surgeon: Adin Hector, MD;  Location: [place];  Service: General;  Laterality: Right;    There were no vitals filed for this visit.  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, Training and development officer, [place],  games on tablet - R hand dominant    AROM   Right Shoulder Flexion 130 Degrees   Right Shoulder ABduction 135 Degrees   Left Shoulder Flexion 140 Degrees   Left Shoulder ABduction 135 Degrees   Right Wrist Extension 58 Degrees   Right Wrist Flexion 72 Degrees   Left Wrist Extension 52 Degrees   Left Wrist Flexion 75 Degrees         LYMPHEDEMA/ONCOLOGY QUESTIONNAIRE - 11/20/15 1704     Type   Cancer Type breast    Surgeries   Lumpectomy Date --  2014   Number Lymph Nodes Removed 11  11 on R , L 1   Date Lymphedema/Swelling Started   Date 10/04/15   What other symptoms do you have   Are you Having Heaviness or Tightness Yes   Are you having Pain Yes   Are you having pitting edema No   Do you have infections No   Other Symptoms + Phalens bilateral , and Tinel over CT    Lymphedema Stage   Stage STAGE 1 SPONTANEOUSLY REVERSIBLE   Lymphedema Assessments   Lymphedema Assessments --  started with CT or tendinitis - now some swelling   Right Upper Extremity Lymphedema   15 cm Proximal to Olecranon Process 32 cm   10 cm Proximal to Olecranon Process 30.8 cm   Olecranon Process 26.5 cm   15 cm Proximal to Ulnar Styloid Process 25.2 cm   10 cm Proximal to Ulnar Styloid Process 22.4 cm   Just Proximal to Ulnar Styloid Process 17 cm   Across Hand at [employer] 19 cm   At [place] of 2nd Digit 6.5 cm   At [facility] of Thumb 6.2 cm   Left Upper Extremity Lymphedema   15 cm Proximal to Olecranon Process 32.5 cm   10 cm Proximal to Olecranon Process 32.3 cm   Olecranon Process 25.5 cm   15 cm Proximal to Ulnar Styloid Process 24.3 cm   10 cm Proximal to Ulnar Styloid Process 21 cm   Just Proximal to Ulnar Styloid Process 15.7 cm   Across Hand at [employer] 19 cm   At [place] of 2nd Digit 6.5 cm   At [facility] of Thumb 6.2 cm      Pt ed on doing contrast at home - tendon glides ,wrist AROM - 2-3 times day - use isotoner glove , tubi grip and wrist splint - night time and if can during day                     OT Education - 11/20/15 1709    Education provided Yes   Education Details findings of eval and HEP   Person(s) Educated Patient   Methods Explanation;Demonstration;Tactile cues;Verbal cues;Handout   Comprehension Verbal cues required;Returned demonstration;Verbalized understanding          OT Short Term Goals - 11/20/15 1720  OT SHORT TERM  GOAL #1   Title Pain improve to less than 3/10 during day   Baseline pain increase to 5-6/10    Time 3   Period Weeks   Status New   OT SHORT TERM GOAL #2   Title Pt to be ind in HEP to decrease pain and edema in R wrist and forearm    Baseline very little knowledge    Time 3   Period Weeks   Status New           OT Long Term Goals - 11/20/15 1721    OT LONG TERM GOAL #1   Title Quickdash improve by 15 points    Baseline Score at eval 34.1   Time 5   Period Weeks   Status New   OT LONG TERM GOAL #2   Title Pt to be measured and fitted for new compressoin sleeve and gauntlet if edema decrease at wrist to elbow    Baseline old sleeve is about 1-2 yrs old    Time 6   Period Weeks   Status New               Plan - 11/20/15 1711    Clinical Impression Statement Pt present this date with complains of pain and increase edema at R wrist , forearm to elbow - pt report pain better - was on predisone few wks ago - and pain now only end of day - pt do test positive for CTS in bilateral hands -  pt over the counter sleeve and gauntlet is older than  year to 2 - and need new compression -but pt was fitted this date with  isotoner glove , tubi grip D for hand to elbow - and CT splint - HEP provided -  pt L wrist increase by 1.3 cm  ,and forearm to elbow increase by 1 cm compare to L - will reassess pt in 10 days    Pt will benefit from skilled therapeutic intervention in order to improve on the following deficits (Retired) Pain;Increased edema;Impaired flexibility   Rehab Potential Good   OT Frequency 1x / week   OT Duration 6 weeks   OT Treatment/Interventions Self-care/ADL training;Manual lymph drainage;Contrast Bath;Therapeutic exercises;Patient/family education   Plan Assess circumference and pain  as well CT- HEP   OT Home Exercise Plan See pt instruction- to do contrast prior to ROM    Consulted and Agree with Plan of Care Patient        Problem List Patient Active  Problem List   Diagnosis Date Noted  . Lymphedema 10/23/2015  . Carpal tunnel syndrome 10/23/2015  . Right arm pain 10/23/2015  . Adverse effect of drug/medicinal 09/22/2015  . Encounter for routine gynecological examination 02/10/2015  . Routine general medical examination at a health care facility 02/07/2015  . Cancer of breast ([place]) 01/31/2015  . Degenerative disc disease, lumbar 02/04/2014  . Anterolisthesis 02/04/2014  . Low back pain 02/02/2014  . Abnormal urinalysis 02/02/2014  . Postmenopausal estrogen deficiency 09/02/2013  . Febrile neutropenia ([place]) 01/06/2013  . Breast cancer ([place]) 01/06/2013  . Anemia of chronic disease 01/06/2013  . Thrombocytopenia, unspecified ([place]) 01/06/2013  . Lacunar infarction ([place]) 04/17/2012  . 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 Pure hypercholesterolemia 04/05/2008  . Essential hypertension 03/30/2008  . ANXIETY DEPRESSION 11/17/2007  . TOBACCO USE 11/17/2007  . BACK PAIN, CHRONIC 09/23/2007    Rosalyn Gess OTR/L,CLT 11/20/2015, 5:30 PM  [facility] [address], [place], [address] Phone: [phone]   Fax:  [phone]  Name: NGUN SCHEIDEMAN MRN: [id number] Date of Birth: [birth year]

## 2015-11-30 ENCOUNTER — Ambulatory Visit: Payer: BLUE CROSS/BLUE SHIELD | Attending: Oncology | Admitting: Occupational Therapy

## 2015-11-30 DIAGNOSIS — M25632 Stiffness of left wrist, not elsewhere classified: Secondary | ICD-10-CM

## 2015-11-30 DIAGNOSIS — I972 Postmastectomy lymphedema syndrome: Secondary | ICD-10-CM | POA: Diagnosis not present

## 2015-11-30 DIAGNOSIS — M25532 Pain in left wrist: Secondary | ICD-10-CM | POA: Insufficient documentation

## 2015-11-30 NOTE — Therapy (Signed)
[facility] [address] [place], [address] Phone: [phone]   Fax:  [phone]  Occupational Therapy Treatment  Patient Details  Name: Amy Leonard MRN: [id number] Date of Birth: [birth year] Referring Provider: Oliva Bustard  Encounter Date: 11/30/2015      OT End of Session - 11/30/15 1844    Visit Number 2   Number of Visits 5   Date for OT Re-Evaluation 12/25/15   OT Start Time [id number]   OT Stop Time 1344   OT Time Calculation (min) 39 min   Activity Tolerance Patient tolerated treatment well   Behavior During Therapy [facility] for tasks assessed/performed      Past Medical History  Diagnosis Date  . Elevated WBC count     nl diff  . Chronic back pain     spinal stenosis  . Tobacco abuse   . Anxiety and depression   . HSV infection     recurrent (side and buttox)  . Migraine   . Glaucoma     ?  . Carpal tunnel syndrome   . HLD (hyperlipidemia)   . Folliculitis [id number]  . Anxiety   . Depression   . 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 GERD (gastroesophageal reflux disease)   . Hypertension   . Breast cancer ([place])   . Wears dentures     top  . Erosive gastritis   . Barrett's esophageal ulceration   . Cancer of breast ([place]) 01/31/2015    Past Surgical History  Procedure Laterality Date  . Btl    . Epidural steroid injection  2008  . Colonoscopy    . Upper gi endoscopy    . Partial mastectomy with needle localization and axillary sentinel lymph node bx Bilateral 11/23/2012    Procedure: PARTIAL MASTECTOMY WITH NEEDLE LOCALIZATION AND AXILLARY SENTINEL LYMPH NODE BX;  Surgeon: Adin Hector, MD;  Location: [place];  Service: General;  Laterality: Bilateral;  . Portacath placement Right 11/23/2012    Procedure: INSERTION PORT-A-CATH;  Surgeon: Adin Hector, MD;  Location: [place];  Service: General;  Laterality: Right;  . Port-a-cath removal  Right 08/25/2013    Procedure: REMOVAL PORT-A-CATH;  Surgeon: Adin Hector, MD;  Location: [place];  Service: General;  Laterality: Right;    There were no vitals filed for this visit.      Subjective Assessment - 11/30/15 1840    Subjective  I could get the hot and cold about 2 x day - but then yesterdayI had to do some shelves - and then my arm killing me again - sore and swollen at my wrist and forearm    Patient Stated Goals Want to get the swelling and pain better - do not want to get lymphedema    Currently in Pain? No/denies             LYMPHEDEMA/ONCOLOGY QUESTIONNAIRE - 11/30/15 1308    Right Upper Extremity Lymphedema   15 cm Proximal to Olecranon Process 32 cm   10 cm Proximal to Olecranon Process 29.8 cm   Olecranon Process 26.1 cm   15 cm Proximal to Ulnar Styloid Process 25.5 cm   10 cm Proximal to Ulnar Styloid Process 22.5 cm   Just Proximal to Ulnar Styloid Process 17 cm   Across Hand at [employer] 19 cm   At [place] of 2nd Digit 6.6 cm   At [facility] of Thumb  6.3 cm   Left Upper Extremity Lymphedema   15 cm Proximal to Olecranon Process 32.4 cm   10 cm Proximal to Olecranon Process 30.8 cm   Olecranon Process 25.5 cm   15 cm Proximal to Ulnar Styloid Process 24 cm   10 cm Proximal to Ulnar Styloid Process 20.5 cm   Just Proximal to Ulnar Styloid Process 15.5 cm   Across Hand at [employer] 18 cm   At [place] of 2nd Digit 6.1 cm   At [facility] of Thumb 6 cm    Self MLD -reviewed  Stimulate cervical , L axillary, R ing ln  Pump AAA, and R AI Upper arm and inside of prox upperarm , upper arm  Rework AAA, R AE  Forearm dorsal , upper arm , rework Volar forearm , upper arm and rework Stimulate cervical ln, axillary and ing ln  Hand out provided    Pt to cont with tubigrip , isotoner glove and wrist splint \ Then next week get  Measured for new glove and sleeve at  [employer]             OT Treatments/Exercises (OP) -  11/30/15 0001    Manual Therapy   Manual therapy comments Measurements taken again - see flowsheet - self MLD done to pt and pt taught how to do - hand out provided                  OT Education - 11/30/15 1844    Education provided Yes   Education Details HEP and MLD taught   Person(s) Educated Patient   Methods Explanation;Demonstration;Tactile cues   Comprehension Returned demonstration;Verbalized understanding          OT Short Term Goals - 11/20/15 1720    OT SHORT TERM GOAL #1   Title Pain improve to less than 3/10 during day   Baseline pain increase to 5-6/10    Time 3   Period Weeks   Status New   OT SHORT TERM GOAL #2   Title Pt to be ind in HEP to decrease pain and edema in R wrist and forearm    Baseline very little knowledge    Time 3   Period Weeks   Status New           OT Long Term Goals - 11/20/15 1721    OT LONG TERM GOAL #1   Title Quickdash improve by 15 points    Baseline Score at eval 34.1   Time 5   Period Weeks   Status New   OT LONG TERM GOAL #2   Title Pt to be measured and fitted for new compressoin sleeve and gauntlet if edema decrease at wrist to elbow    Baseline old sleeve is about 1-2 yrs old    Time 6   Period Weeks   Status New               Plan - 11/30/15 1845    Clinical Impression Statement Pt's measurements still about the same at R UE - R UE compare to L still increase at hand , wrist and forearm - pt to wear glove , tubigrip and splint - and then next week measured for new glove and sleeve - will see her in 2-3 wks    Rehab Potential Good   OT Frequency 1x / week   OT Duration 4 weeks   OT Treatment/Interventions Self-care/ADL training;Manual lymph drainage;Contrast Bath;Therapeutic exercises;Patient/family education  Plan assesss circumference and  new garments    OT Home Exercise Plan See pt instruction- to do contrast prior to ROM    Consulted and Agree with Plan of Care Patient      Patient  will benefit from skilled therapeutic intervention in order to improve the following deficits and impairments:  Pain, Increased edema, Impaired flexibility  Visit Diagnosis: Pain in left wrist  Stiffness of left wrist, not elsewhere classified  Post-mastectomy lymphedema syndrome    Problem List Patient Active Problem List   Diagnosis Date Noted  . Lymphedema 10/23/2015  . Carpal tunnel syndrome 10/23/2015  . Right arm pain 10/23/2015  . Adverse effect of drug/medicinal 09/22/2015  . Encounter for routine gynecological examination 02/10/2015  . Routine general medical examination at a health care facility 02/07/2015  . Cancer of breast ([place]) 01/31/2015  . Degenerative disc disease, lumbar 02/04/2014  . Anterolisthesis 02/04/2014  . Low back pain 02/02/2014  . Abnormal urinalysis 02/02/2014  . Postmenopausal estrogen deficiency 09/02/2013  . Febrile neutropenia ([place]) 01/06/2013  . Breast cancer ([place]) 01/06/2013  . Anemia of chronic disease 01/06/2013  . Thrombocytopenia, unspecified ([place]) 01/06/2013  . Lacunar infarction ([place]) 04/17/2012  . 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 Pure hypercholesterolemia 04/05/2008  . Essential hypertension 03/30/2008  . ANXIETY DEPRESSION 11/17/2007  . TOBACCO USE 11/17/2007  . BACK PAIN, CHRONIC 09/23/2007    Rosalyn Gess OTR/l,CLT  11/30/2015, 6:47 PM  [place] [address], [place], [address] Phone: [phone]   Fax:  [phone]  Name: Amy Leonard MRN: [id number] Date of Birth: Nov 07, 1960

## 2015-11-30 NOTE — Patient Instructions (Signed)
Self MLD -reviewed  Stimulate cervical , L axillary, R ing ln  Pump AAA, and R AI Upper arm and inside of prox upperarm , upper arm  Rework AAA, R AE  Forearm dorsal , upper arm , rework Volar forearm , upper arm and rework Stimulate cervical ln, axillary and ing ln  Hand out provided    Pt to cont with tubigrip , isotoner glove and wrist splint \ Then next week get  Measured for new glove and sleeve at  [employer]

## 2015-12-12 ENCOUNTER — Telehealth: Payer: Self-pay

## 2015-12-12 DIAGNOSIS — M5441 Lumbago with sciatica, right side: Secondary | ICD-10-CM

## 2015-12-12 DIAGNOSIS — M5442 Lumbago with sciatica, left side: Secondary | ICD-10-CM

## 2015-12-12 DIAGNOSIS — M5136 Other intervertebral disc degeneration, lumbar region: Secondary | ICD-10-CM

## 2015-12-12 NOTE — Telephone Encounter (Signed)
Pt left v/m; pt said when decides on getting MRI to let Dr Glori Bickers know; pt wants to get MRI of lower back due to pain ordered. Pain level is 10. Pt having difficulty walking due to back pain. Discussed at 03/15/15 appt.Please advise.

## 2015-12-12 NOTE — Telephone Encounter (Signed)
I put the order in as external-not sure where she wants to go Will route to [facility]

## 2015-12-14 NOTE — Telephone Encounter (Signed)
Appt scheduled at [place] and patient aware.

## 2015-12-20 ENCOUNTER — Ambulatory Visit (HOSPITAL_COMMUNITY)
Admission: RE | Admit: 2015-12-20 | Discharge: 2015-12-20 | Disposition: A | Discharge status: Home / Self Care | Payer: BLUE CROSS/BLUE SHIELD | Source: Ambulatory Visit | Attending: Family Medicine | Admitting: Family Medicine

## 2015-12-20 ENCOUNTER — Other Ambulatory Visit: Payer: Self-pay | Admitting: Oncology

## 2015-12-20 ENCOUNTER — Ambulatory Visit
Admission: RE | Admit: 2015-12-20 | Discharge: 2015-12-20 | Disposition: A | Discharge status: Home / Self Care | Payer: BLUE CROSS/BLUE SHIELD | Source: Ambulatory Visit | Attending: Oncology | Admitting: Oncology

## 2015-12-20 DIAGNOSIS — M5441 Lumbago with sciatica, right side: Secondary | ICD-10-CM | POA: Insufficient documentation

## 2015-12-20 DIAGNOSIS — R928 Other abnormal and inconclusive findings on diagnostic imaging of breast: Secondary | ICD-10-CM | POA: Diagnosis not present

## 2015-12-20 DIAGNOSIS — M5127 Other intervertebral disc displacement, lumbosacral region: Secondary | ICD-10-CM | POA: Diagnosis not present

## 2015-12-20 DIAGNOSIS — C50919 Malignant neoplasm of unspecified site of unspecified female breast: Secondary | ICD-10-CM

## 2015-12-20 DIAGNOSIS — M4806 Spinal stenosis, lumbar region: Secondary | ICD-10-CM | POA: Diagnosis not present

## 2015-12-20 DIAGNOSIS — M5442 Lumbago with sciatica, left side: Secondary | ICD-10-CM | POA: Diagnosis not present

## 2015-12-20 DIAGNOSIS — M5136 Other intervertebral disc degeneration, lumbar region: Secondary | ICD-10-CM | POA: Insufficient documentation

## 2015-12-20 DIAGNOSIS — M5126 Other intervertebral disc displacement, lumbar region: Secondary | ICD-10-CM | POA: Insufficient documentation

## 2015-12-20 DIAGNOSIS — Z853 Personal history of malignant neoplasm of breast: Secondary | ICD-10-CM | POA: Diagnosis not present

## 2015-12-20 HISTORY — DX: Reserved for concepts with insufficient information to code with codable children: IMO0002

## 2015-12-20 HISTORY — DX: Personal history of antineoplastic chemotherapy: Z92.21

## 2015-12-20 LAB — HM MAMMOGRAPHY: HM Mammogram: NORMAL (ref 0–4)

## 2015-12-21 ENCOUNTER — Encounter: Payer: Self-pay | Admitting: *Deleted

## 2015-12-22 ENCOUNTER — Telehealth: Payer: Self-pay | Admitting: Family Medicine

## 2015-12-22 MED ORDER — TRAMADOL HCL 50 MG PO TABS: 25.0000 mg | ORAL_TABLET | Freq: Three times a day (TID) | ORAL | Status: DC | PRN

## 2015-12-22 NOTE — Telephone Encounter (Signed)
Rx called in as prescribed 

## 2015-12-22 NOTE — Telephone Encounter (Signed)
Px written for call in  For tramadol

## 2015-12-22 NOTE — Telephone Encounter (Signed)
-----   Message from Tammi Sou, [place] sent at 12/22/2015  3:35 PM EDT ----- Pt notified of Dr. Marliss Coots comments. She does want to try taking 1/2 tab, pt said she may take a whole tablet today before bed because she is off for the next two days. Pt does need a refill, she need Rx sent to [facility] [address]

## 2015-12-25 DIAGNOSIS — M4806 Spinal stenosis, lumbar region: Secondary | ICD-10-CM | POA: Diagnosis not present

## 2015-12-25 DIAGNOSIS — M4316 Spondylolisthesis, lumbar region: Secondary | ICD-10-CM | POA: Diagnosis not present

## 2015-12-26 ENCOUNTER — Telehealth: Payer: Self-pay | Admitting: *Deleted

## 2015-12-26 NOTE — Telephone Encounter (Signed)
Received referral for low dose lung cancer screening CT scan. Voicemail left at phone number listed in EMR for patient to call me back to facilitate scheduling scan.  

## 2015-12-27 ENCOUNTER — Other Ambulatory Visit: Payer: Self-pay | Admitting: Oncology

## 2016-01-29 ENCOUNTER — Other Ambulatory Visit: Payer: BLUE CROSS/BLUE SHIELD

## 2016-01-29 ENCOUNTER — Ambulatory Visit: Payer: BLUE CROSS/BLUE SHIELD | Admitting: Oncology

## 2016-01-30 ENCOUNTER — Other Ambulatory Visit: Payer: BLUE CROSS/BLUE SHIELD

## 2016-01-30 ENCOUNTER — Ambulatory Visit: Payer: BLUE CROSS/BLUE SHIELD | Admitting: Hematology and Oncology

## 2016-01-31 ENCOUNTER — Inpatient Hospital Stay: Payer: BLUE CROSS/BLUE SHIELD | Admitting: Internal Medicine

## 2016-01-31 ENCOUNTER — Inpatient Hospital Stay: Payer: BLUE CROSS/BLUE SHIELD

## 2016-01-31 ENCOUNTER — Telehealth: Payer: Self-pay | Admitting: Family Medicine

## 2016-01-31 NOTE — Telephone Encounter (Signed)
Pt called to let you know she has faxed paperwork to let you know what type of follow up care she needs after back surgery She wanted to know if you were willing to help her with this Please advise

## 2016-01-31 NOTE — Telephone Encounter (Signed)
Not sure- depends on what is involved (since I am not a Psychologist, sport and exercise)  I will watch out for the paperwork to review it

## 2016-01-31 NOTE — Telephone Encounter (Signed)
Left voicemail letting pt know Dr. Tower's comments  

## 2016-02-07 NOTE — Telephone Encounter (Signed)
I reviewed the paperwork- and do not feel comfortable providing post operative care- I think she really needs an orthopedist on board who has more knowledge regarding expectations of healing/wound care etc  Ultimately the surgeon who does the procedure will continue their care until healed - but I know she cannot stay out of state that long sine surgery is not being done here I will put paperwork back in the IN box

## 2016-02-08 NOTE — Telephone Encounter (Signed)
Left voicemail requesting pt to call office back 

## 2016-02-08 NOTE — Telephone Encounter (Signed)
Pt called back and she advise me that the surgeon's office just called her today and advise her they have just opened an office in [place] that will be providing her f/u care so she doesn't need [place] to provide the f/u care, she said she will keep [place] updated on how she is doing

## 2016-02-08 NOTE — Telephone Encounter (Signed)
That is great

## 2016-02-09 ENCOUNTER — Other Ambulatory Visit: Payer: Self-pay | Admitting: *Deleted

## 2016-02-09 DIAGNOSIS — C50919 Malignant neoplasm of unspecified site of unspecified female breast: Secondary | ICD-10-CM

## 2016-02-12 ENCOUNTER — Inpatient Hospital Stay: Payer: BLUE CROSS/BLUE SHIELD | Attending: Internal Medicine

## 2016-02-12 ENCOUNTER — Inpatient Hospital Stay (HOSPITAL_BASED_OUTPATIENT_CLINIC_OR_DEPARTMENT_OTHER): Payer: BLUE CROSS/BLUE SHIELD | Admitting: Internal Medicine

## 2016-02-12 VITALS — BP 92/69 | HR 84 | Temp 97.9°F | Resp 18 | Wt 176.8 lb

## 2016-02-12 DIAGNOSIS — C50411 Malignant neoplasm of upper-outer quadrant of right female breast: Secondary | ICD-10-CM | POA: Diagnosis not present

## 2016-02-12 DIAGNOSIS — Z170 Estrogen receptor positive status [ER+]: Secondary | ICD-10-CM | POA: Insufficient documentation

## 2016-02-12 DIAGNOSIS — Z79899 Other long term (current) drug therapy: Secondary | ICD-10-CM

## 2016-02-12 DIAGNOSIS — Z923 Personal history of irradiation: Secondary | ICD-10-CM | POA: Insufficient documentation

## 2016-02-12 DIAGNOSIS — Z9221 Personal history of antineoplastic chemotherapy: Secondary | ICD-10-CM

## 2016-02-12 DIAGNOSIS — C50312 Malignant neoplasm of lower-inner quadrant of left female breast: Secondary | ICD-10-CM | POA: Diagnosis not present

## 2016-02-12 DIAGNOSIS — I10 Essential (primary) hypertension: Secondary | ICD-10-CM | POA: Diagnosis not present

## 2016-02-12 DIAGNOSIS — M545 Low back pain: Secondary | ICD-10-CM | POA: Insufficient documentation

## 2016-02-12 DIAGNOSIS — G8929 Other chronic pain: Secondary | ICD-10-CM

## 2016-02-12 DIAGNOSIS — Z171 Estrogen receptor negative status [ER-]: Secondary | ICD-10-CM | POA: Insufficient documentation

## 2016-02-12 DIAGNOSIS — Z7982 Long term (current) use of aspirin: Secondary | ICD-10-CM | POA: Insufficient documentation

## 2016-02-12 DIAGNOSIS — M5136 Other intervertebral disc degeneration, lumbar region: Secondary | ICD-10-CM

## 2016-02-12 DIAGNOSIS — Z7952 Long term (current) use of systemic steroids: Secondary | ICD-10-CM

## 2016-02-12 DIAGNOSIS — M1990 Unspecified osteoarthritis, unspecified site: Secondary | ICD-10-CM | POA: Diagnosis not present

## 2016-02-12 DIAGNOSIS — F17210 Nicotine dependence, cigarettes, uncomplicated: Secondary | ICD-10-CM

## 2016-02-12 DIAGNOSIS — Z79811 Long term (current) use of aromatase inhibitors: Secondary | ICD-10-CM | POA: Insufficient documentation

## 2016-02-12 DIAGNOSIS — E785 Hyperlipidemia, unspecified: Secondary | ICD-10-CM | POA: Insufficient documentation

## 2016-02-12 DIAGNOSIS — R42 Dizziness and giddiness: Secondary | ICD-10-CM | POA: Insufficient documentation

## 2016-02-12 DIAGNOSIS — K219 Gastro-esophageal reflux disease without esophagitis: Secondary | ICD-10-CM | POA: Insufficient documentation

## 2016-02-12 DIAGNOSIS — C50919 Malignant neoplasm of unspecified site of unspecified female breast: Secondary | ICD-10-CM

## 2016-02-12 DIAGNOSIS — C50819 Malignant neoplasm of overlapping sites of unspecified female breast: Secondary | ICD-10-CM

## 2016-02-12 LAB — CBC WITH DIFFERENTIAL/PLATELET
BASOS ABS: 0 10*3/uL (ref 0–0.1)
BASOS PCT: 0 %
EOS PCT: 2 %
Eosinophils Absolute: 0.1 10*3/uL (ref 0–0.7)
HEMATOCRIT: 39.5 % (ref 35.0–47.0)
Hemoglobin: 13.5 g/dL (ref 12.0–16.0)
Lymphocytes Relative: 41 %
Lymphs Abs: 3.3 10*3/uL (ref 1.0–3.6)
MCH: 33 pg (ref 26.0–34.0)
MCHC: 34.3 g/dL (ref 32.0–36.0)
MCV: 96.2 fL (ref 80.0–100.0)
MONO ABS: 0.5 10*3/uL (ref 0.2–0.9)
MONOS PCT: 6 %
NEUTROS ABS: 4.2 10*3/uL (ref 1.4–6.5)
Neutrophils Relative %: 51 %
Platelets: 222 10*3/uL (ref 150–440)
RBC: 4.11 MIL/uL (ref 3.80–5.20)
RDW: 13.2 % (ref 11.5–14.5)
WBC: 8.2 10*3/uL (ref 3.6–11.0)

## 2016-02-12 LAB — COMPREHENSIVE METABOLIC PANEL
ALBUMIN: 3.9 g/dL (ref 3.5–5.0)
ALT: 19 U/L (ref 14–54)
ANION GAP: 5 (ref 5–15)
AST: 17 U/L (ref 15–41)
Alkaline Phosphatase: 63 U/L (ref 38–126)
BILIRUBIN TOTAL: 0.6 mg/dL (ref 0.3–1.2)
BUN: 15 mg/dL (ref 6–20)
CO2: 29 mmol/L (ref 22–32)
Calcium: 9.1 mg/dL (ref 8.9–10.3)
Chloride: 105 mmol/L (ref 101–111)
Creatinine, Ser: 0.62 mg/dL (ref 0.44–1.00)
GFR calc non Af Amer: >60 mL/min (ref >60)
GLUCOSE: 102 mg/dL — AB (ref 65–99)
POTASSIUM: 3.6 mmol/L (ref 3.5–5.1)
SODIUM: 139 mmol/L (ref 135–145)
TOTAL PROTEIN: 6.5 g/dL (ref 6.5–8.1)

## 2016-02-12 NOTE — Assessment & Plan Note (Signed)
Chronic back pain not new. She'll continue to follow up with PCP.

## 2016-02-12 NOTE — Progress Notes (Signed)
Patient states she is a little dizzy today.  States she has not felt well today.  Feels like her head is foggy.  BP 92/69 HR 84  Patient states she had some nausea this morning.  Also has chronic back pain that she is hoping to have surgery for.

## 2016-02-12 NOTE — Progress Notes (Signed)
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 Humphrey Rolls; [place]]  # RIGHT-IMC; lumpec & ALND-T1cN0; TNBC s/p RT  # LEFT-IMC;STAGE II  T1bN1 ER/PR-Pos Her 2 Neu-NEG s/p Lumpec & RT  # s/p AC x4- Taxol/ gem-Carbo  # MARCH 2015- STARTED Arimidex    # BMD- 2015- N     Breast cancer ([place])   01/06/2013 Initial Diagnosis Breast cancer ([place])    INTERVAL HISTORY:  Amy Leonard 55 y.o.  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  Patient denies any new lumps or bumps. Denies any headaches. Denies any unusual cough or chest pain or weight loss.  She complains of chronic low back pain; noted to have degenerative disc/arthritis. She also complains of dizziness the last 1-2 days. Denies any falls.  REVIEW OF SYSTEMS:  A complete 10 point review of system is done which is negative except mentioned above/history of present illness.   PAST MEDICAL HISTORY :  Past Medical History  Diagnosis Date  . Elevated WBC count     nl diff  . Chronic back pain     spinal stenosis  . Tobacco abuse   . Anxiety and depression   . HSV infection     recurrent (side and buttox)  . Migraine   . Glaucoma     ?  . Carpal tunnel syndrome   . HLD (hyperlipidemia)   . Folliculitis [id number]  . Anxiety   . Depression   . 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 GERD (gastroesophageal reflux disease)   . Hypertension   . Wears dentures     top  . Erosive gastritis   . Barrett's esophageal ulceration   . Cancer of breast ([place]) 01/31/2015  . Breast cancer ([place]) 2014    BILATERAL LUMPECTOMY  . Radiation 2014    BREAST CA  . History of chemotherapy 2014    BREAST CA    PAST SURGICAL  HISTORY :   Past Surgical History  Procedure Laterality Date  . Btl    . Epidural steroid injection  2008  . Colonoscopy    . Upper gi endoscopy    . Partial mastectomy with needle localization and axillary sentinel lymph node bx Bilateral 11/23/2012    Procedure: PARTIAL MASTECTOMY WITH NEEDLE LOCALIZATION AND AXILLARY SENTINEL LYMPH NODE BX;  Surgeon: Adin Hector, MD;  Location: [place];  Service: General;  Laterality: Bilateral;  . Portacath placement Right 11/23/2012    Procedure: INSERTION PORT-A-CATH;  Surgeon: Adin Hector, MD;  Location: [place];  Service: General;  Laterality: Right;  . Port-a-cath removal Right 08/25/2013    Procedure: REMOVAL PORT-A-CATH;  Surgeon: Adin Hector, MD;  Location: [place];  Service: General;  Laterality: Right;    FAMILY HISTORY :   Family History  Problem Relation Age of Onset  . Stroke Maternal Grandmother   . Diabetes Maternal Grandmother   . Diabetes Mother   . Hypertension Mother   . Leukemia Mother   . Obesity Mother   . Depression Sister   . Bipolar disorder Son     Bipolar / oppositional defiant  .  Alcohol abuse Sister   . Drug abuse Sister   . Alcohol abuse Sister   . Drug abuse Sister   . Colon cancer Neg Hx   . Breast cancer Neg Hx   . Alcohol abuse Brother   . Drug abuse Brother     SOCIAL HISTORY:   Social History  Substance Use Topics  . Smoking status: Current Every Day Smoker -- 0.50 packs/day for 33 years    Types: Cigarettes  . Smokeless tobacco: Never Used     Comment: 1 1/2 ppd -form given 10-07-12  . Alcohol Use: 0.0 oz/week    0 Standard drinks or equivalent per week     Comment: beer rarely    ALLERGIES:  is allergic to lyrica; bupropion hcl; chlorhexidine gluconate; hydrocod polst-cpm polst er; lisinopril; and naproxen sodium.  MEDICATIONS:  Current Outpatient Prescriptions  Medication Sig Dispense Refill  . Acetaminophen (TYLENOL  ARTHRITIS PAIN PO) Take 2 tablets by mouth as needed (pain).     Marland Kitchen acyclovir ointment (ZOVIRAX) 5 % Apply 1 application topically as needed (flare-up). 15 g 1  . anastrozole (ARIMIDEX) 1 MG tablet TAKE 1 TABLET DAILY 90 tablet 0  . aspirin 81 MG chewable tablet Chew 1 tablet (81 mg total) by mouth daily. 90 tablet 3  . atorvastatin (LIPITOR) 10 MG tablet TAKE 1 TABLET BY MOUTH EVERY DAY 90 tablet 1  . b complex vitamins tablet Take 1 tablet by mouth daily.    . calcium carbonate (TUMS - DOSED IN MG ELEMENTAL CALCIUM) 500 MG chewable tablet Chew by mouth as directed.      . Calcium Carbonate-Vitamin D (CALCIUM + D PO) Take 1 tablet by mouth daily.    Marland Kitchen losartan (COZAAR) 50 MG tablet Take 1 tablet (50 mg total) by mouth daily. 90 tablet 3  . nicotine (NICODERM CQ) 14 mg/24hr patch Place 1 patch (14 mg total) onto the skin daily. 28 patch 3  . nitroGLYCERIN (NITRO-DUR) 0.2 mg/hr patch Apply 1/4 of a patch to the affected area and change every 24 hours (re: tendinopathy) 30 patch 12  . omeprazole (PRILOSEC) 40 MG capsule Take 1 capsule (40 mg total) by mouth daily. 90 capsule 0  . predniSONE (DELTASONE) 10 MG tablet Take 3 pills once daily by mouth for 3 days, then 2 pills once daily for 3 days, then 1 pill once daily for 3 days and then stop 18 tablet 0  . ranitidine (ZANTAC) 300 MG tablet Take 150 mg by mouth at bedtime.     . simethicone (MYLICON) 80 MG chewable tablet Chew 1 tablet (80 mg total) by mouth every 8 (eight) hours as needed for flatulence. 60 tablet 3  . traMADol (ULTRAM) 50 MG tablet Take 0.5-1 tablets (25-50 mg total) by mouth every 8 (eight) hours as needed for moderate pain or severe pain (caution of sedation). 30 tablet 1   No current facility-administered medications for this visit.    PHYSICAL EXAMINATION: ECOG PERFORMANCE STATUS: 0 - Asymptomatic  BP 92/69 mmHg  Pulse 84  Temp(Src) 97.9 F (36.6 C) (Tympanic)  Resp 18  Wt 176 lb 12.9 oz (80.2 kg)  Filed Weights    02/12/16 1446  Weight: 176 lb 12.9 oz (80.2 kg)    GENERAL: Well-nourished well-developed; Alert, no distress and comfortable.   Alone EYES: no pallor or icterus OROPHARYNX: no thrush or ulceration; good dentition  NECK: supple, no masses felt LYMPH:  no palpable lymphadenopathy in the cervical, axillary or inguinal regions LUNGS:  clear to auscultation and  No wheeze or crackles HEART/CVS: regular rate & rhythm and no murmurs; No lower extremity edema ABDOMEN:abdomen soft, non-tender and normal bowel sounds Musculoskeletal:no cyanosis of digits and no clubbing  PSYCH: alert & oriented x 3 with fluent speech NEURO: no focal motor/sensory deficits SKIN:  no rashes or significant lesions Right and left BREAST exam [in the presence of nurse]- no unusual skin changes or dominant masses felt. Surgical scars noted.   LABORATORY DATA:  I have reviewed the data as listed    Component Value Date/Time   NA 139 02/12/2016 1425   NA 140 07/25/2014 1100   NA 140 11/02/2013 1040   K 3.6 02/12/2016 1425   K 4.1 07/25/2014 1100   K 3.9 11/02/2013 1040   CL 105 02/12/2016 1425   CL 105 07/25/2014 1100   CL 109* 02/17/2013 1120   CO2 29 02/12/2016 1425   CO2 30 07/25/2014 1100   CO2 24 11/02/2013 1040   GLUCOSE 102* 02/12/2016 1425   GLUCOSE 97 07/25/2014 1100   GLUCOSE 103 11/02/2013 1040   GLUCOSE 99 02/17/2013 1120   BUN 15 02/12/2016 1425   BUN 15 07/25/2014 1100   BUN 9.3 11/02/2013 1040   CREATININE 0.62 02/12/2016 1425   CREATININE 0.74 07/25/2014 1100   CREATININE 0.7 11/02/2013 1040   CALCIUM 9.1 02/12/2016 1425   CALCIUM 8.6 07/25/2014 1100   CALCIUM 9.5 11/02/2013 1040   PROT 6.5 02/12/2016 1425   PROT 6.4 07/25/2014 1100   PROT 6.2* 11/02/2013 1040   ALBUMIN 3.9 02/12/2016 1425   ALBUMIN 3.5 07/25/2014 1100   ALBUMIN 3.7 11/02/2013 1040   AST 17 02/12/2016 1425   AST 16 07/25/2014 1100   AST 20 11/02/2013 1040   ALT 19 02/12/2016 1425   ALT 24 07/25/2014 1100   ALT  22 11/02/2013 1040   ALKPHOS 63 02/12/2016 1425   ALKPHOS 75 07/25/2014 1100   ALKPHOS 84 11/02/2013 1040   BILITOT 0.6 02/12/2016 1425   BILITOT 0.3 07/25/2014 1100   BILITOT 0.48 11/02/2013 1040   GFRNONAA >60 02/12/2016 1425   GFRNONAA >60 07/25/2014 1100   GFRAA >60 02/12/2016 1425   GFRAA >60 07/25/2014 1100    No results found for: SPEP, UPEP  Lab Results  Component Value Date   WBC 8.2 02/12/2016   NEUTROABS 4.2 02/12/2016   HGB 13.5 02/12/2016   HCT 39.5 02/12/2016   MCV 96.2 02/12/2016   PLT 222 02/12/2016      Chemistry      Component Value Date/Time   NA 139 02/12/2016 1425   NA 140 07/25/2014 1100   NA 140 11/02/2013 1040   K 3.6 02/12/2016 1425   K 4.1 07/25/2014 1100   K 3.9 11/02/2013 1040   CL 105 02/12/2016 1425   CL 105 07/25/2014 1100   CL 109* 02/17/2013 1120   CO2 29 02/12/2016 1425   CO2 30 07/25/2014 1100   CO2 24 11/02/2013 1040   BUN 15 02/12/2016 1425   BUN 15 07/25/2014 1100   BUN 9.3 11/02/2013 1040   CREATININE 0.62 02/12/2016 1425   CREATININE 0.74 07/25/2014 1100   CREATININE 0.7 11/02/2013 1040      Component Value Date/Time   CALCIUM 9.1 02/12/2016 1425   CALCIUM 8.6 07/25/2014 1100   CALCIUM 9.5 11/02/2013 1040   ALKPHOS 63 02/12/2016 1425   ALKPHOS 75 07/25/2014 1100   ALKPHOS 84 11/02/2013 1040   AST 17 02/12/2016 1425   AST  16 07/25/2014 1100   AST 20 11/02/2013 1040   ALT 19 02/12/2016 1425   ALT 24 07/25/2014 1100   ALT 22 11/02/2013 1040   BILITOT 0.6 02/12/2016 1425   BILITOT 0.3 07/25/2014 1100   BILITOT 0.48 11/02/2013 1040       RADIOGRAPHIC STUDIES: I have personally reviewed the radiological images as listed and agreed with the findings in the report. No results found.   ASSESSMENT & PLAN:  Breast cancer ([place]) Bilateral synchronous breast cancer-early stage. Patient currently on adjuvant Arimidex. No concerns for any recurrence based on physical exam/symptoms recent mammogram.     Dizzy Blood  pressure is systolic 0000000; mildly dizzy. Creatinine within normal limits. Recommend holding off Cozaar; and then reevaluate the blood pressure tomorrow. Also recommend she call to PCP- for reevaluation for her blood pressure medications.  Low back pain Chronic back pain not new. She'll continue to follow up with PCP.     No orders of the defined types were placed in this encounter.   All questions were answered. The patient knows to call the clinic with any problems, questions or concerns.      Cammie Sickle, MD 02/12/2016 5:46 PM

## 2016-02-12 NOTE — Assessment & Plan Note (Addendum)
Bilateral synchronous breast cancer-early stage. Patient currently on adjuvant Arimidex. No concerns for any recurrence based on physical exam/symptoms recent mammogram.

## 2016-02-12 NOTE — Assessment & Plan Note (Signed)
Blood pressure is systolic 0000000; mildly dizzy. Creatinine within normal limits. Recommend holding off Cozaar; and then reevaluate the blood pressure tomorrow. Also recommend she call to PCP- for reevaluation for her blood pressure medications.

## 2016-02-22 ENCOUNTER — Other Ambulatory Visit: Payer: Self-pay

## 2016-02-22 NOTE — Telephone Encounter (Signed)
Please call in - it won't let me send the starter pack electronically  She is to take the starter pack first before starting 1 mg bid  Update if any side effects or problems

## 2016-02-22 NOTE — Telephone Encounter (Signed)
Pt left v/m requesting rx for chantix sent to [facility] [address]. Last annual 02/10/15. Please advise.

## 2016-02-26 ENCOUNTER — Telehealth: Payer: Self-pay

## 2016-02-26 MED ORDER — VARENICLINE TARTRATE 0.5 MG X 11 & 1 MG X 42 PO MISC: ORAL | Status: DC

## 2016-02-26 MED ORDER — VARENICLINE TARTRATE 1 MG PO TABS: 1.0000 mg | ORAL_TABLET | Freq: Two times a day (BID) | ORAL | Status: DC

## 2016-02-26 NOTE — Telephone Encounter (Signed)
PLEASE NOTE: All timestamps contained within this report are represented as [place] Standard Time. CONFIDENTIALTY NOTICE: This fax transmission is intended only for the addressee. It contains information that is legally privileged, confidential or otherwise protected from use or disclosure. If you are not the intended recipient, you are strictly prohibited from reviewing, disclosing, copying using or disseminating any of this information or taking any action in reliance on or regarding this information. If you have received this fax in error, please notify us immediately by telephone so that we can arrange for its return to [place]. Phone: [phone], Toll-Free: [phone], Fax: [phone] Page: 1 of 2 Call Id: [id number] [place] Patient Name: Amy Leonard Gender: Female DOB: 04/23/1961 Age: 55 Y 45 M 26 D Return Phone Number: [phone] (Primary), [phone] (Secondary) Address: City/State/Zip: [place] [address] Client [place] Night - Client Client Site [facility], Roque Lias - MD Contact Type Call Who Is Calling Patient / Member / Family / Caregiver Call Type Triage / Clinical Relationship To Patient Self Return Phone Number [phone] (Primary) Chief Complaint Blood Pressure Low Reason for Call Medication Question Initial Comment Caller says she was transferred over to [place] from triage nurse at office, went to see oncologist, got her blood pressure taken, it was very low. Caller is currently on blood pressure medication, dr suggested that she go down in her dosage or switch to a different medication. Additional Comment Caller stated that she will be at work until 6pm, so use the primary phone number if she will be called back before 6pm, but if it is after 6pm, use the secondary phone number because that is  her cell phone number. Translation No Nurse Assessment Nurse: Denyse Amass, RN, Benjamine Mola Date/Time (Eastern Time): 02/23/2016 5:19:15 PM Confirm and document reason for call. If symptomatic, describe symptoms. You must click the next button to save text entered. ---Patient states she saw her oncologist 2 weeks ago and her blood pressure was 92/something. She checked her blood pressure at [facility] and it was 110/something. Her Oncologist told her to stop taking her Losartin. She stopped taking the Losartin but has not checked her blood pressure. Patient wants to know if she should take 1/2 of a tablet of Losartin. Patient states he does not have any symptoms. Has the patient traveled out of the country within the last 30 days? ---Not Applicable Does the patient have any new or worsening symptoms? ---No Guidelines Guideline Title Affirmed Question Affirmed Notes Nurse Date/Time (Eastern Time) Disp. Time Eilene Ghazi Time) Disposition Final User 02/23/2016 5:27:08 PM Clinical Call Yes [place], RN, Benjamine Mola PLEASE NOTE: All timestamps contained within this report are represented as [place] Standard Time. CONFIDENTIALTY NOTICE: This fax transmission is intended only for the addressee. It contains information that is legally privileged, confidential or otherwise protected from use or disclosure. If you are not the intended recipient, you are strictly prohibited from reviewing, disclosing, copying using or disseminating any of this information or taking any action in reliance on or regarding this information. If you have received this fax in error, please notify us immediately by telephone so that we can arrange for its return to [place]. Phone: [phone], Toll-Free: [phone], Fax: [phone] Page: 2 of 2 Call Id: [id number] Comments User: Lin Givens, RN Date/Time Eilene Ghazi Time): 02/23/2016 5:27:01 PM Patient advised to call during office hours to make an appointment with her  doctor to  adjust her blood pressure medication.

## 2016-02-26 NOTE — Telephone Encounter (Signed)
I will see her then  

## 2016-02-26 NOTE — Telephone Encounter (Signed)
Pt request chantix called to [place]. Medication phoned to [place] pharmacy as instructed. Pt notified as instructed and pt voiced understanding.

## 2016-02-26 NOTE — Telephone Encounter (Signed)
I spoke with pt and she is working and scheduled appt on 03/01/16 at [address] with Dr Glori Bickers; this is first appt that suited pts schedule. If pt condition changes or worsens prior to appt pt will call back. Pt said she feels good today.

## 2016-03-01 ENCOUNTER — Ambulatory Visit (INDEPENDENT_AMBULATORY_CARE_PROVIDER_SITE_OTHER): Payer: BLUE CROSS/BLUE SHIELD | Admitting: Family Medicine

## 2016-03-01 ENCOUNTER — Other Ambulatory Visit: Payer: Self-pay | Admitting: Family Medicine

## 2016-03-01 ENCOUNTER — Encounter: Payer: Self-pay | Admitting: Family Medicine

## 2016-03-01 VITALS — BP 110/78 | HR 69 | Temp 98.3°F | Ht 62.75 in | Wt 178.2 lb

## 2016-03-01 DIAGNOSIS — F17200 Nicotine dependence, unspecified, uncomplicated: Secondary | ICD-10-CM | POA: Diagnosis not present

## 2016-03-01 DIAGNOSIS — R42 Dizziness and giddiness: Secondary | ICD-10-CM | POA: Diagnosis not present

## 2016-03-01 DIAGNOSIS — I10 Essential (primary) hypertension: Secondary | ICD-10-CM | POA: Diagnosis not present

## 2016-03-01 NOTE — Progress Notes (Signed)
Subjective:    Patient ID: Amy Leonard, female    DOB: 12/10/1960, 55 y.o.   MRN: [id number]  HPI Here for low bp  BP Readings from Last 3 Encounters:  03/01/16 110/94  02/12/16 92/69  10/23/15 110/76    Last one of 92/69 at oncology made her dizzy Holding cozaar currently (3 days) Tried taking it for 2 d-got dizzy again   Not feeling herself in general- off and on for a few weeks   Wt is up 2 lb with bmi of 31  Today is day 3 without any cigarettes  Has a vape with small amt of nicotine - and ready to change it to no nicotine  Asked for chantix and also nic patch (stopping that today) Plan to quit nicotine for 30 d before upcoming back surgery in Aug    A lot back pain - looking forward to getting surgery done  Hard to walk in am / trying to get through the work day   Patient Active Problem List   Diagnosis Date Noted  . Dizzy 02/12/2016  . Lymphedema 10/23/2015  . Carpal tunnel syndrome 10/23/2015  . Right arm pain 10/23/2015  . Adverse effect of drug/medicinal 09/22/2015  . Encounter for routine gynecological examination 02/10/2015  . Routine general medical examination at a health care facility 02/07/2015  . Degenerative disc disease, lumbar 02/04/2014  . Anterolisthesis 02/04/2014  . Low back pain 02/02/2014  . Abnormal urinalysis 02/02/2014  . Postmenopausal estrogen deficiency 09/02/2013  . Febrile neutropenia ([place]) 01/06/2013  . Breast cancer ([place]) 01/06/2013  . Anemia of chronic disease 01/06/2013  . Thrombocytopenia, unspecified ([facility] [place]) 01/06/2013  . Lacunar infarction ([facility]) 04/17/2012  . 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 Pure hypercholesterolemia 04/05/2008  . Essential hypertension 03/30/2008  . ANXIETY DEPRESSION 11/17/2007  . TOBACCO USE 11/17/2007  . BACK PAIN, CHRONIC 09/23/2007   Past Medical History  Diagnosis Date  . Elevated WBC count     nl diff  . Chronic back pain     spinal stenosis  . Tobacco abuse   . Anxiety and  depression   . HSV infection     recurrent (side and buttox)  . Migraine   . Glaucoma     ?  . Carpal tunnel syndrome   . HLD (hyperlipidemia)   . Folliculitis [id number]  . Anxiety   . Depression   . 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 GERD (gastroesophageal reflux disease)   . Hypertension   . Wears dentures     top  . Erosive gastritis   . Barrett's esophageal ulceration   . Cancer of breast ([place]) 01/31/2015  . Breast cancer ([facility] [facility]) 2014    BILATERAL LUMPECTOMY  . Radiation 2014    BREAST CA  . History of chemotherapy 2014    BREAST CA   Past Surgical History  Procedure Laterality Date  . Btl    . Epidural steroid injection  2008  . Colonoscopy    . Upper gi endoscopy    . Partial mastectomy with needle localization and axillary sentinel lymph node bx Bilateral 11/23/2012    Procedure: PARTIAL MASTECTOMY WITH NEEDLE LOCALIZATION AND AXILLARY SENTINEL LYMPH NODE BX;  Surgeon: Adin Hector, MD;  Location: [facility];  Service: General;  Laterality: Bilateral;  . Portacath placement Right 11/23/2012    Procedure: INSERTION PORT-A-CATH;  Surgeon: Adin Hector, MD;  Location: [facility];  Service: General;  Laterality: Right;  .  Port-a-cath removal Right 08/25/2013    Procedure: REMOVAL PORT-A-CATH;  Surgeon: Adin Hector, MD;  Location: [facility];  Service: General;  Laterality: Right;   Social History  Substance Use Topics  . Smoking status: Light Tobacco Smoker -- 0.50 packs/day for 33 years    Types: Cigarettes  . Smokeless tobacco: Never Used     Comment: no smoking since 02/27/16  . Alcohol Use: 0.0 oz/week    0 Standard drinks or equivalent per week     Comment: beer rarely   Family History  Problem Relation Age of Onset  . Stroke Maternal Grandmother   . Diabetes Maternal Grandmother   . Diabetes Mother   . Hypertension Mother   . Leukemia Mother   . Obesity Mother   . Depression Sister   . Bipolar  disorder Son     Bipolar / oppositional defiant  . Alcohol abuse Sister   . Drug abuse Sister   . Alcohol abuse Sister   . Drug abuse Sister   . Colon cancer Neg Hx   . Breast cancer Neg Hx   . Alcohol abuse Brother   . Drug abuse Brother    Allergies  Allergen Reactions  . Lyrica [Pregabalin] Other (See Comments)    sedated  . Bupropion Hcl Nausea Only and Other (See Comments)     GI, sleepy  . Chlorhexidine Gluconate Itching and Rash  . Hydrocod Polst-Cpm Polst Er Nausea And Vomiting    vomiting  . Lisinopril Other (See Comments)    Leg cramps    . Naproxen Sodium Other (See Comments)    does not tolerate   Current Outpatient Prescriptions on File Prior to Visit  Medication Sig Dispense Refill  . Acetaminophen (TYLENOL ARTHRITIS PAIN PO) Take 2 tablets by mouth as needed (pain).     Marland Kitchen acyclovir ointment (ZOVIRAX) 5 % Apply 1 application topically as needed (flare-up). 15 g 1  . anastrozole (ARIMIDEX) 1 MG tablet TAKE 1 TABLET DAILY 90 tablet 0  . aspirin 81 MG chewable tablet Chew 1 tablet (81 mg total) by mouth daily. 90 tablet 3  . atorvastatin (LIPITOR) 10 MG tablet TAKE 1 TABLET BY MOUTH EVERY DAY 90 tablet 1  . b complex vitamins tablet Take 1 tablet by mouth daily.    . calcium carbonate (TUMS - DOSED IN MG ELEMENTAL CALCIUM) 500 MG chewable tablet Chew by mouth as directed.      . Calcium Carbonate-Vitamin D (CALCIUM + D PO) Take 1 tablet by mouth daily.    . nicotine (NICODERM CQ) 14 mg/24hr patch Place 1 patch (14 mg total) onto the skin daily. 28 patch 3  . nitroGLYCERIN (NITRO-DUR) 0.2 mg/hr patch Apply 1/4 of a patch to the affected area and change every 24 hours (re: tendinopathy) 30 patch 12  . omeprazole (PRILOSEC) 40 MG capsule Take 1 capsule (40 mg total) by mouth daily. 90 capsule 0  . ranitidine (ZANTAC) 300 MG tablet Take 150 mg by mouth at bedtime.     . simethicone (MYLICON) 80 MG chewable tablet Chew 1 tablet (80 mg total) by mouth every 8 (eight)  hours as needed for flatulence. 60 tablet 3  . traMADol (ULTRAM) 50 MG tablet Take 0.5-1 tablets (25-50 mg total) by mouth every 8 (eight) hours as needed for moderate pain or severe pain (caution of sedation). 30 tablet 1  . varenicline (CHANTIX) 1 MG tablet Take 1 tablet (1 mg total) by mouth 2 (two) times daily.  After finishing the starter pack 60 tablet 3   No current facility-administered medications on file prior to visit.    Review of Systems Review of Systems  Constitutional: Negative for fever, appetite change, and unexpected weight change. pos for fatigue Eyes: Negative for pain and visual disturbance.  Respiratory: Negative for cough and shortness of breath.   Cardiovascular: Negative for cp or palpitations    Gastrointestinal: Negative for nausea, diarrhea and constipation.  Genitourinary: Negative for urgency and frequency.  Skin: Negative for pallor or rash   Neurological: Negative for weakness, light-headedness, numbness and headaches. (light headedness is improved) MSK pos for ongoing disabling back pain  Hematological: Negative for adenopathy. Does not bruise/bleed easily.  Psychiatric/Behavioral: Negative for dysphoric mood. The patient is not nervous/anxious.         Objective:   Physical Exam  Constitutional: She appears well-developed and well-nourished. No distress.  obese and well appearing   HENT:  Head: Normocephalic and atraumatic.  Mouth/Throat: Oropharynx is clear and moist.  Eyes: Conjunctivae and EOM are normal. Pupils are equal, round, and reactive to light.  Neck: Normal range of motion. Neck supple. No JVD present. Carotid bruit is not present. No thyromegaly present.  Cardiovascular: Normal rate, regular rhythm, normal heart sounds and intact distal pulses.  Exam reveals no gallop.   Pulmonary/Chest: Effort normal and breath sounds normal. No respiratory distress. She has no wheezes. She has no rales.  No crackles  Diffusely distant bs No wheeze    Abdominal: Soft. Bowel sounds are normal. She exhibits no distension, no abdominal bruit and no mass. There is no tenderness.  Musculoskeletal: She exhibits no edema.  Limited rom of spine Slow gait  Lymphadenopathy:    She has no cervical adenopathy.  Neurological: She is alert. She has normal reflexes. She displays no atrophy and no tremor. No cranial nerve deficit or sensory deficit. She exhibits normal muscle tone. Coordination normal.  Skin: Skin is warm and dry. No rash noted. No pallor.  Psychiatric: She has a normal mood and affect.          Assessment & Plan:   Problem List Items Addressed This Visit      Cardiovascular and Mediastinum   Essential hypertension - Primary (Chronic)    Recently hypotensive and symptomatic (dizzy) Today bp is well controlled off of her losartan BP: 110/78 mmHg   She will remain off of it  Monitor carefully  Looking forward to being able to exercise once back surgery is done and healed  Disc diet and enc wt loss         Other   TOBACCO USE    Making progress re: quitting  Now day 3 w/o cigarettes Will also change to non nicotine vape  Using chantix -tolerating it   Enc her to keep working on it and commended so far      Dizzy    Suspect due to hypotension  Off bp med - feeling better  BP: 110/78 mmHg  Off medication

## 2016-03-01 NOTE — Progress Notes (Signed)
Pre visit review using our clinic review tool, if applicable. No additional management support is needed unless otherwise documented below in the visit note. 

## 2016-03-01 NOTE — Patient Instructions (Signed)
Stay off losartan- bp is 110/78 off of it (good)  Take care of yourself  Keep working on quitting smoking and nicotine

## 2016-03-03 NOTE — Assessment & Plan Note (Signed)
Recently hypotensive and symptomatic (dizzy) Today bp is well controlled off of her losartan BP: 110/78 mmHg   She will remain off of it  Monitor carefully  Looking forward to being able to exercise once back surgery is done and healed  Disc diet and enc wt loss

## 2016-03-03 NOTE — Assessment & Plan Note (Signed)
Suspect due to hypotension  Off bp med - feeling better  BP: 110/78 mmHg  Off medication

## 2016-03-03 NOTE — Assessment & Plan Note (Signed)
Making progress re: quitting  Now day 3 w/o cigarettes Will also change to non nicotine vape  Using chantix -tolerating it   Enc her to keep working on it and commended so far

## 2016-03-07 ENCOUNTER — Telehealth: Payer: Self-pay

## 2016-03-07 NOTE — Telephone Encounter (Signed)
Amy Leonard notified of Dr. Marliss Coots comments. Amy Leonard was just asking for pt due to their office being in [place] and pt having to drive so far the the f/u care but they can do the f/u care at their office if pt is okay with the drive to [place]

## 2016-03-07 NOTE — Telephone Encounter (Signed)
No-I am not comfortable with doing post op care - I had discussed this with patient Thanks

## 2016-03-07 NOTE — Telephone Encounter (Signed)
Anda Kraft nurse with [facility] in [place], Dr Rodman Key McGurt left v/m; pt is planning to have surgery in [place] and Anda Kraft wants to know if Dr Glori Bickers would consider doing f/u care for pt after surgery.Please advise.

## 2016-03-18 ENCOUNTER — Other Ambulatory Visit: Payer: Self-pay | Admitting: Family Medicine

## 2016-03-19 ENCOUNTER — Telehealth: Payer: Self-pay | Admitting: *Deleted

## 2016-03-19 NOTE — Telephone Encounter (Signed)
Received referral for low dose CT lung cancer screening. Despite multiple attempts at all contact numbers available, have not been able to arrange for shared decision making visit and CT scan. Letter mailed to patient in final attempt to contact patient. I will be happy to assist in the future if patient so desires. Will forward to referring provider.  

## 2016-03-19 NOTE — Telephone Encounter (Signed)
Aware, thank you.

## 2016-03-26 ENCOUNTER — Other Ambulatory Visit: Payer: Self-pay | Admitting: Oncology

## 2016-03-26 HISTORY — PX: LUMBAR FUSION: SHX111

## 2016-05-16 ENCOUNTER — Ambulatory Visit: Payer: BLUE CROSS/BLUE SHIELD | Attending: Gastroenterology | Admitting: Physical Therapy

## 2016-05-16 DIAGNOSIS — R42 Dizziness and giddiness: Secondary | ICD-10-CM | POA: Insufficient documentation

## 2016-05-16 DIAGNOSIS — M6281 Muscle weakness (generalized): Secondary | ICD-10-CM | POA: Insufficient documentation

## 2016-05-16 NOTE — Therapy (Signed)
[facility] [address] [place], [address] Phone: [phone]   Fax:  [phone]  Physical Therapy Evaluation  Patient Details  Name: Amy Leonard MRN: [id number] Date of Birth: 01-18-1961 Referring Provider: Charlette Caffey  Encounter Date: 05/16/2016      PT End of Session - 05/16/16 0901    Visit Number 1   Number of Visits 13   Date for PT Re-Evaluation 06/27/16   PT Start Time 0815   PT Stop Time [id number]   PT Time Calculation (min) 40 min   Activity Tolerance Patient tolerated treatment well      Past Medical History:  Diagnosis Date  . Anxiety   . Anxiety and depression   . Barrett's esophageal ulceration   . 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 Breast cancer ([facility]) 2014   BILATERAL LUMPECTOMY  . Cancer of breast ([facility]) 01/31/2015  . Carpal tunnel syndrome   . Chronic back pain    spinal stenosis  . Depression   . Elevated WBC count    nl diff  . Erosive gastritis   . Folliculitis [id number]  . GERD (gastroesophageal reflux disease)   . Glaucoma    ?  Marland Kitchen History of chemotherapy 2014   BREAST CA  . HLD (hyperlipidemia)   . HSV infection    recurrent (side and buttox)  . Hypertension   . Migraine   . Radiation 2014   BREAST CA  . Tobacco abuse   . Wears dentures    top    Past Surgical History:  Procedure Laterality Date  . BTL    . COLONOSCOPY    . epidural steroid injection  2008  . PARTIAL MASTECTOMY WITH NEEDLE LOCALIZATION AND AXILLARY SENTINEL LYMPH NODE BX Bilateral 11/23/2012   Procedure: PARTIAL MASTECTOMY WITH NEEDLE LOCALIZATION AND AXILLARY SENTINEL LYMPH NODE BX;  Surgeon: Adin Hector, MD;  Location: [facility];  Service: General;  Laterality: Bilateral;  . PORT-A-CATH REMOVAL Right 08/25/2013   Procedure: REMOVAL PORT-A-CATH;  Surgeon: Adin Hector, MD;  Location: [facility];  Service: General;  Laterality: Right;  . PORTACATH  PLACEMENT Right 11/23/2012   Procedure: INSERTION PORT-A-CATH;  Surgeon: Adin Hector, MD;  Location: [facility];  Service: General;  Laterality: Right;  . UPPER GI ENDOSCOPY      There were no vitals filed for this visit.       Subjective Assessment - 05/16/16 0815    Subjective Pt had surgery 04/18/2016 - surgery was a lumbar fusion from L4 - L5. She was told she needs PT for this. She has minimal pain, some on R side but she is pleased with how she has progressed. Since her surgery she is noticing weakness in her LE but otherwise she is doing well.    Pertinent History Over 10 yrs of back pain prior to surgery.   Diagnostic tests multiple images prior to and after surgery   Patient Stated Goals learn how to get stronger.   Currently in Pain? No/denies            [facility] PT Assessment - 05/16/16 0001      Assessment   Medical Diagnosis spondylolisthesis lumbar region   Referring Provider McGirt   Onset Date/Surgical Date 04/18/16   Prior Therapy none     Precautions   Precautions Back   Precaution Comments no bending lifting twisting     Balance Screen  Has the patient fallen in the past 6 months No   Has the patient had a decrease in activity level because of a fear of falling?  Yes   Is the patient reluctant to leave their home because of a fear of falling?  No     Prior Function   Level of Independence Independent   Vocation Full time employment   Leisure R hand dominant, gardening     Sensation   Additional Comments neuropathy related N/T in stocking distribution     Posture/Postural Control   Posture Comments FHP     ROM / Strength   AROM / PROM / Strength AROM;Strength     AROM   Overall AROM Comments B ankle DF limited to 5 deg. from neutral.     Strength   Overall Strength Comments 3/5 strength on BLE for all movements.      Palpation   Palpation comment Deferred     Transfers   Five time sit to stand comments  18 sec, in need  of intervention     Ambulation/Gait   Stairs Yes   Stair Management Technique Two rails   Number of Stairs 4   Height of Stairs 4   Gait Comments Decr. L stance time. wide BOS     Balance   Balance Assessed Yes     Static Standing Balance   Static Standing - Balance Support Right upper extremity supported   Static Standing Balance -  Activities  Single Leg Stance - Right Leg;Single Leg Stance - Left Leg;Tandam Stance - Right Leg;Tandam Stance - Left Leg            Objective: Sit<>stand, multiple rounds of practice needed to get basic performance correct with UE support for stand, no UE for sitting. Eventually performed 3x10 with definite plop.  Educated pt on performance of this at home.  Heel raises - atttempted unilaterally but unable to do so well on L. Performed 3x10 on each side with minimal UE support.  Amb in clinic x4 min total, pt is grossly safe with this performance. Will perform x20 min walking at an indoor site like [employer] as part of her HEP.               PT Education - 05/16/16 0900    Education provided Yes   Education Details HEP   Person(s) Educated Patient   Methods Explanation   Comprehension Verbalized understanding             PT Long Term Goals - 05/16/16 0905      PT LONG TERM GOAL #1   Title Pt will be I with HEP to improve ankle strength from 3/5 to 5/5   Baseline 3/5   Time 6   Period Weeks   Status New     PT LONG TERM GOAL #2   Title Pt will be able to perform sit<>stand with no UE support to decr. risk for falls   Baseline definite UE support and "plopping"   Time 6   Period Weeks   Status New     PT LONG TERM GOAL #3   Title Pt will be able to lift 10 # wt from the ground with proper mechancs   Baseline unable to touch the ground    Time 6   Period Weeks   Status New               Plan - 05/16/16 0901    Clinical Impression  Statement Pt is a pleasant 55 y/o female with c/o LE weakness s/p  chronic back pain and recent back surgery. She presents with significant muscle weakness B, impaired balance, and N/T in a stocking glove distribution.   Rehab Potential Fair   Clinical Impairments Affecting Rehab Potential motivation, PLOF/chronic pain, sedentary lifestyle   PT Frequency 2x / week   PT Duration 6 weeks   PT Treatment/Interventions Aquatic Therapy;Therapeutic activities;Therapeutic exercise;Manual techniques;Functional mobility training;Neuromuscular re-education;ADLs/Self Care Home Management;Gait training;Patient/family education   Consulted and Agree with Plan of Care Patient      Patient will benefit from skilled therapeutic intervention in order to improve the following deficits and impairments:  Abnormal gait, Decreased balance, Difficulty walking, Pain, Decreased strength, Decreased range of motion  Visit Diagnosis: Muscle weakness (generalized) - Plan: PT plan of care cert/re-cert  Dizziness and giddiness - Plan: PT plan of care cert/re-cert     Problem List Patient Active Problem List   Diagnosis Date Noted  . Dizzy 02/12/2016  . Lymphedema 10/23/2015  . Carpal tunnel syndrome 10/23/2015  . Adverse effect of drug/medicinal 09/22/2015  . Encounter for routine gynecological examination 02/10/2015  . Routine general medical examination at a health care facility 02/07/2015  . Degenerative disc disease, lumbar 02/04/2014  . Anterolisthesis 02/04/2014  . Low back pain 02/02/2014  . Abnormal urinalysis 02/02/2014  . Postmenopausal estrogen deficiency 09/02/2013  . Febrile neutropenia ([facility]) 01/06/2013  . Breast cancer ([facility]) 01/06/2013  . Anemia of chronic disease 01/06/2013  . Thrombocytopenia, unspecified ([facility]) 01/06/2013  . Lacunar infarction ([facility]) 04/17/2012  . 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 Pure hypercholesterolemia 04/05/2008  . Essential hypertension 03/30/2008  . ANXIETY DEPRESSION 11/17/2007  . TOBACCO USE 11/17/2007  . BACK PAIN,  CHRONIC 09/23/2007    Kashon Kraynak 05/16/2016, 9:13 AM  [facility] [address] [place], [address] Phone: [phone]   Fax:  [phone]  Name: Amy Leonard MRN: [id number] Date of Birth: 05/04/1961

## 2016-05-21 ENCOUNTER — Encounter: Payer: BLUE CROSS/BLUE SHIELD | Admitting: Physical Therapy

## 2016-05-21 DIAGNOSIS — M4316 Spondylolisthesis, lumbar region: Secondary | ICD-10-CM | POA: Diagnosis not present

## 2016-05-24 ENCOUNTER — Ambulatory Visit: Payer: BLUE CROSS/BLUE SHIELD | Admitting: Physical Therapy

## 2016-05-24 DIAGNOSIS — R42 Dizziness and giddiness: Secondary | ICD-10-CM | POA: Diagnosis not present

## 2016-05-24 DIAGNOSIS — M6281 Muscle weakness (generalized): Secondary | ICD-10-CM

## 2016-05-24 NOTE — Therapy (Signed)
[facility] [address] [place], [address] Phone: [phone]   Fax:  [phone]  Physical Therapy Treatment  Patient Details  Name: Amy Leonard MRN: [id number] Date of Birth: 1961-05-11 Referring Provider: Charlette Caffey  Encounter Date: 05/24/2016      PT End of Session - 05/24/16 1025    Visit Number 2   Number of Visits 13   Date for PT Re-Evaluation 06/27/16   PT Start Time 0946   PT Stop Time 1025   PT Time Calculation (min) 39 min   Activity Tolerance Patient tolerated treatment well;Patient limited by fatigue      Past Medical History:  Diagnosis Date  . Anxiety   . Anxiety and depression   . Barrett's esophageal ulceration   . 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 Breast cancer ([place]) 2014   BILATERAL LUMPECTOMY  . Cancer of breast ([place]) 01/31/2015  . Carpal tunnel syndrome   . Chronic back pain    spinal stenosis  . Depression   . Elevated WBC count    nl diff  . Erosive gastritis   . Folliculitis [id number]  . GERD (gastroesophageal reflux disease)   . Glaucoma    ?  Marland Kitchen History of chemotherapy 2014   BREAST CA  . HLD (hyperlipidemia)   . HSV infection    recurrent (side and buttox)  . Hypertension   . Migraine   . Radiation 2014   BREAST CA  . Tobacco abuse   . Wears dentures    top    Past Surgical History:  Procedure Laterality Date  . BTL    . COLONOSCOPY    . epidural steroid injection  2008  . PARTIAL MASTECTOMY WITH NEEDLE LOCALIZATION AND AXILLARY SENTINEL LYMPH NODE BX Bilateral 11/23/2012   Procedure: PARTIAL MASTECTOMY WITH NEEDLE LOCALIZATION AND AXILLARY SENTINEL LYMPH NODE BX;  Surgeon: Adin Hector, MD;  Location: [facility];  Service: General;  Laterality: Bilateral;  . PORT-A-CATH REMOVAL Right 08/25/2013   Procedure: REMOVAL PORT-A-CATH;  Surgeon: Adin Hector, MD;  Location: [facility];  Service: General;   Laterality: Right;  . PORTACATH PLACEMENT Right 11/23/2012   Procedure: INSERTION PORT-A-CATH;  Surgeon: Adin Hector, MD;  Location: [facility];  Service: General;  Laterality: Right;  . UPPER GI ENDOSCOPY      There were no vitals filed for this visit.      Subjective Assessment - 05/24/16 0949    Subjective Pt has had an incr. in pain since starting her exercises. It is a tolerable pain. She also noted incr. soreness in R gastroc.   Pertinent History Over 10 yrs of back pain prior to surgery.   Diagnostic tests multiple images prior to and after surgery   Patient Stated Goals learn how to get stronger.   Currently in Pain? Yes   Pain Score 1    Pain Location Back                Objective: TG at L16 (attempted higher level but unable to do so) squats 3x20  TG at L11 heel raises, pt had very poor ability to perform this.   Ankle alphabets to activate muscles, x1 Ankle circles focusing on improving eversion 3x10 with manual overpressure for performance of this.  YTB PF 3x15.  RTB hip abduction 3x15  Ball squeezes 3 sec. Holds 3x10.  Nu-step L1 x6 min with near constant cuing  for posture, for incr. SPM, for improvement in TA contraction.  Pt required cuing throughout for posture and improved control.                 PT Education - 05/24/16 1025    Education provided Yes   Education Details HEP   Person(s) Educated Patient   Methods Explanation   Comprehension Verbalized understanding             PT Long Term Goals - 05/16/16 0905      PT LONG TERM GOAL #1   Title Pt will be I with HEP to improve ankle strength from 3/5 to 5/5   Baseline 3/5   Time 6   Period Weeks   Status New     PT LONG TERM GOAL #2   Title Pt will be able to perform sit<>stand with no UE support to decr. risk for falls   Baseline definite UE support and "plopping"   Time 6   Period Weeks   Status New     PT LONG TERM GOAL #3   Title Pt  will be able to lift 10 # wt from the ground with proper mechancs   Baseline unable to touch the ground    Time 6   Period Weeks   Status New               Plan - 05/24/16 1026    Clinical Impression Statement Pt has more clearly limited straight PF than seen during eval. unable to achieve a neutral PF muscle activation so will continue to address this. pt has very poor tolerance for exercise and reported incr. back pain following session/limited by fatigue.   Rehab Potential Fair   Clinical Impairments Affecting Rehab Potential motivation, PLOF/chronic pain, sedentary lifestyle   PT Frequency 2x / week   PT Duration 6 weeks   PT Treatment/Interventions Aquatic Therapy;Therapeutic activities;Therapeutic exercise;Manual techniques;Functional mobility training;Neuromuscular re-education;ADLs/Self Care Home Management;Gait training;Patient/family education   Consulted and Agree with Plan of Care Patient      Patient will benefit from skilled therapeutic intervention in order to improve the following deficits and impairments:  Abnormal gait, Decreased balance, Difficulty walking, Pain, Decreased strength, Decreased range of motion  Visit Diagnosis: Muscle weakness (generalized)     Problem List Patient Active Problem List   Diagnosis Date Noted  . Dizzy 02/12/2016  . Lymphedema 10/23/2015  . Carpal tunnel syndrome 10/23/2015  . Adverse effect of drug/medicinal 09/22/2015  . Encounter for routine gynecological examination 02/10/2015  . Routine general medical examination at a health care facility 02/07/2015  . Degenerative disc disease, lumbar 02/04/2014  . Anterolisthesis 02/04/2014  . Low back pain 02/02/2014  . Abnormal urinalysis 02/02/2014  . Postmenopausal estrogen deficiency 09/02/2013  . Febrile neutropenia ([facility]) 01/06/2013  . Breast cancer ([place]) 01/06/2013  . Anemia of chronic disease 01/06/2013  . Thrombocytopenia, unspecified ([place]) 01/06/2013  . 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 Basal ganglia infarction ([place] [facility]) 04/17/2012  . Pure hypercholesterolemia 04/05/2008  . Essential hypertension 03/30/2008  . ANXIETY DEPRESSION 11/17/2007  . TOBACCO USE 11/17/2007  . BACK PAIN, CHRONIC 09/23/2007    Amy Leonard 05/24/2016, 10:28 AM  [facility] [address] [place], [address] Phone: [phone]   Fax:  [phone]  Name: TENEA STAY MRN: [id number] Date of Birth: Nov 03, 1960

## 2016-05-28 ENCOUNTER — Ambulatory Visit: Payer: BLUE CROSS/BLUE SHIELD | Attending: Gastroenterology | Admitting: Physical Therapy

## 2016-05-28 DIAGNOSIS — R42 Dizziness and giddiness: Secondary | ICD-10-CM | POA: Insufficient documentation

## 2016-05-28 DIAGNOSIS — M6281 Muscle weakness (generalized): Secondary | ICD-10-CM | POA: Insufficient documentation

## 2016-05-31 ENCOUNTER — Ambulatory Visit: Payer: BLUE CROSS/BLUE SHIELD | Admitting: Physical Therapy

## 2016-05-31 DIAGNOSIS — M6281 Muscle weakness (generalized): Secondary | ICD-10-CM

## 2016-05-31 DIAGNOSIS — R42 Dizziness and giddiness: Secondary | ICD-10-CM | POA: Diagnosis not present

## 2016-05-31 NOTE — Therapy (Signed)
[facility] [address] [place], [address] Phone: [phone]   Fax:  [phone]  Physical Therapy Treatment  Patient Details  Name: Amy Leonard MRN: [id number] Date of Birth: [birth year] Referring Provider: Charlette Caffey  Encounter Date: 05/31/2016      PT End of Session - 05/31/16 1109    Visit Number 3   Number of Visits 13   Date for PT Re-Evaluation 06/27/16   PT Start Time 1024   PT Stop Time 1105   PT Time Calculation (min) 41 min   Activity Tolerance Patient tolerated treatment well;Patient limited by fatigue      Past Medical History:  Diagnosis Date  . Anxiety   . Anxiety and depression   . Barrett's esophageal ulceration   . Basal ganglia infarction ([facility]) 04/17/2012  . Breast cancer ([facility]) 2014   BILATERAL LUMPECTOMY  . Cancer of breast ([facility]) 01/31/2015  . Carpal tunnel syndrome   . Chronic back pain    spinal stenosis  . Depression   . Elevated WBC count    nl diff  . Erosive gastritis   . Folliculitis [id number]  . GERD (gastroesophageal reflux disease)   . Glaucoma    ?  Marland Kitchen History of chemotherapy 2014   BREAST CA  . HLD (hyperlipidemia)   . HSV infection    recurrent (side and buttox)  . Hypertension   . Migraine   . Radiation 2014   BREAST CA  . Tobacco abuse   . Wears dentures    top    Past Surgical History:  Procedure Laterality Date  . BTL    . COLONOSCOPY    . epidural steroid injection  2008  . PARTIAL MASTECTOMY WITH NEEDLE LOCALIZATION AND AXILLARY SENTINEL LYMPH NODE BX Bilateral 11/23/2012   Procedure: PARTIAL MASTECTOMY WITH NEEDLE LOCALIZATION AND AXILLARY SENTINEL LYMPH NODE BX;  Surgeon: Adin Hector, MD;  Location: [facility];  Service: General;  Laterality: Bilateral;  . PORT-A-CATH REMOVAL Right 08/25/2013   Procedure: REMOVAL PORT-A-CATH;  Surgeon: Adin Hector, MD;  Location: [facility];  Service: General;   Laterality: Right;  . PORTACATH PLACEMENT Right 11/23/2012   Procedure: INSERTION PORT-A-CATH;  Surgeon: Adin Hector, MD;  Location: [facility];  Service: General;  Laterality: Right;  . UPPER GI ENDOSCOPY      There were no vitals filed for this visit.      Subjective Assessment - 05/31/16 1027    Subjective Pt reports she does her exercises 1x per day, unable to do 3x. due to incr. pain. pain is in L hip   Pertinent History Over 10 yrs of back pain prior to surgery.   Diagnostic tests multiple images prior to and after surgery   Patient Stated Goals learn how to get stronger.   Currently in Pain? Yes   Pain Score 3    Pain Location Hip               Objective: TG partial squats 3x20 at seat setting 22  TG attempted heel raises, hwoever incr. Pain with this.  Seated heel raises using foot rocker. 3x2 min.  Supine SLR with cuing for TA contraction 3x1 min each side.  Posterior pelvic tilts 3x1 min practice, cuing to minimize HS use, improved use of core for performance.  Attempted glute bridges, unable to perform due to pain in hip.  Pt required cuing throughout to address control,  posture, where to feel muscle activation.                  PT Education - 05/31/16 1109    Education provided Yes   Education Details Progression of HEP   Person(s) Educated Patient   Methods Explanation   Comprehension Verbalized understanding             PT Long Term Goals - 05/16/16 0905      PT LONG TERM GOAL #1   Title Pt will be I with HEP to improve ankle strength from 3/5 to 5/5   Baseline 3/5   Time 6   Period Weeks   Status New     PT LONG TERM GOAL #2   Title Pt will be able to perform sit<>stand with no UE support to decr. risk for falls   Baseline definite UE support and "plopping"   Time 6   Period Weeks   Status New     PT LONG TERM GOAL #3   Title Pt will be able to lift 10 # wt from the ground with proper mechancs    Baseline unable to touch the ground    Time 6   Period Weeks   Status New               Plan - 05/31/16 1109    Clinical Impression Statement Pt had 2/5 strength in PF with assistance. fatigued throughout session and reported some R knee pain with sit<>stand. No back pain, pt required cuing throghout session to avoid bending/twisting.   Rehab Potential Fair   Clinical Impairments Affecting Rehab Potential motivation, PLOF/chronic pain, sedentary lifestyle   PT Frequency 2x / week   PT Duration 6 weeks   PT Treatment/Interventions Aquatic Therapy;Therapeutic activities;Therapeutic exercise;Manual techniques;Functional mobility training;Neuromuscular re-education;ADLs/Self Care Home Management;Gait training;Patient/family education   Consulted and Agree with Plan of Care Patient      Patient will benefit from skilled therapeutic intervention in order to improve the following deficits and impairments:  Abnormal gait, Decreased balance, Difficulty walking, Pain, Decreased strength, Decreased range of motion  Visit Diagnosis: Muscle weakness (generalized)     Problem List Patient Active Problem List   Diagnosis Date Noted  . Dizzy 02/12/2016  . Lymphedema 10/23/2015  . Carpal tunnel syndrome 10/23/2015  . Adverse effect of drug/medicinal 09/22/2015  . Encounter for routine gynecological examination 02/10/2015  . Routine general medical examination at a health care facility 02/07/2015  . Degenerative disc disease, lumbar 02/04/2014  . Anterolisthesis 02/04/2014  . Low back pain 02/02/2014  . Abnormal urinalysis 02/02/2014  . Postmenopausal estrogen deficiency 09/02/2013  . Febrile neutropenia ([facility]) 01/06/2013  . Breast cancer ([facility]) 01/06/2013  . Anemia of chronic disease 01/06/2013  . Thrombocytopenia, unspecified 01/06/2013  . Lacunar infarction ([facility]) 04/17/2012  . Basal ganglia infarction ([facility]) 04/17/2012  . Pure hypercholesterolemia 04/05/2008  . Essential  hypertension 03/30/2008  . ANXIETY DEPRESSION 11/17/2007  . TOBACCO USE 11/17/2007  . BACK PAIN, CHRONIC 09/23/2007    Dontrail Blackwell 05/31/2016, 11:24 AM  [facility] [address] [place], [address] Phone: [phone]   Fax:  [phone]  Name: Amy Leonard MRN: [id number] Date of Birth: [birth year]

## 2016-06-03 ENCOUNTER — Ambulatory Visit: Payer: BLUE CROSS/BLUE SHIELD | Admitting: Physical Therapy

## 2016-06-10 ENCOUNTER — Ambulatory Visit: Payer: BLUE CROSS/BLUE SHIELD | Admitting: Physical Therapy

## 2016-06-10 DIAGNOSIS — M6281 Muscle weakness (generalized): Secondary | ICD-10-CM | POA: Diagnosis not present

## 2016-06-10 DIAGNOSIS — R42 Dizziness and giddiness: Secondary | ICD-10-CM | POA: Diagnosis not present

## 2016-06-10 NOTE — Therapy (Signed)
[facility] [address], [place], [address] Phone: [phone]   Fax:  [phone]  Physical Therapy Treatment  Patient Details  Name: Amy Leonard MRN: [id number] Date of Birth: 1961-05-31 Referring Provider: Charlette Caffey  Encounter Date: 06/10/2016      PT End of Session - 06/10/16 1105    Visit Number 4   Number of Visits 13   Date for PT Re-Evaluation 06/27/16   PT Start Time 1100   PT Stop Time 1130   PT Time Calculation (min) 30 min   Activity Tolerance Patient tolerated treatment well;Patient limited by fatigue      Past Medical History:  Diagnosis Date  . Anxiety   . Anxiety and depression   . Barrett's esophageal ulceration   . Basal ganglia infarction ([facility]) 04/17/2012  . Breast cancer ([facility]) 2014   BILATERAL LUMPECTOMY  . Cancer of breast ([facility]) 01/31/2015  . Carpal tunnel syndrome   . Chronic back pain    spinal stenosis  . Depression   . Elevated WBC count    nl diff  . Erosive gastritis   . Folliculitis [id number]  . GERD (gastroesophageal reflux disease)   . Glaucoma    ?  Marland Kitchen History of chemotherapy 2014   BREAST CA  . HLD (hyperlipidemia)   . HSV infection    recurrent (side and buttox)  . Hypertension   . Migraine   . Radiation 2014   BREAST CA  . Tobacco abuse   . Wears dentures    top    Past Surgical History:  Procedure Laterality Date  . BTL    . COLONOSCOPY    . epidural steroid injection  2008  . PARTIAL MASTECTOMY WITH NEEDLE LOCALIZATION AND AXILLARY SENTINEL LYMPH NODE BX Bilateral 11/23/2012   Procedure: PARTIAL MASTECTOMY WITH NEEDLE LOCALIZATION AND AXILLARY SENTINEL LYMPH NODE BX;  Surgeon: Adin Hector, MD;  Location: [facility];  Service: General;  Laterality: Bilateral;  . PORT-A-CATH REMOVAL Right 08/25/2013   Procedure: REMOVAL PORT-A-CATH;  Surgeon: Adin Hector, MD;  Location: [facility];  Service: General;   Laterality: Right;  . PORTACATH PLACEMENT Right 11/23/2012   Procedure: INSERTION PORT-A-CATH;  Surgeon: Adin Hector, MD;  Location: [facility];  Service: General;  Laterality: Right;  . UPPER GI ENDOSCOPY      There were no vitals filed for this visit.      Subjective Assessment - 06/10/16 1103    Subjective Pt has been inconsistent with HEP. She is still limping and is feeling very weak.   Pertinent History Over 10 yrs of back pain prior to surgery.   Diagnostic tests multiple images prior to and after surgery   Patient Stated Goals learn how to get stronger.   Currently in Pain? Yes   Pain Score 3    Pain Location Hip                Objective: TG 3x20 with seat setting to 20. Pt extremely fatigued with this.  Ankle rockers facilitated with UE support 3x2 min.  amb in clinic with cuin to minimize reverse trendelenberg on L side.  Nu-step L3 with cuing throughout to maintain SPM above 60 and monitoring O2, noted O2 dropped at one point to 92 so decr. SPM to 50 until it recovered to 95.  Pt requested to end session early, reported no incr. Pain but severe fatigue.  PT Long Term Goals - 05/16/16 0905      PT LONG TERM GOAL #1   Title Pt will be I with HEP to improve ankle strength from 3/5 to 5/5   Baseline 3/5   Time 6   Period Weeks   Status New     PT LONG TERM GOAL #2   Title Pt will be able to perform sit<>stand with no UE support to decr. risk for falls   Baseline definite UE support and "plopping"   Time 6   Period Weeks   Status New     PT LONG TERM GOAL #3   Title Pt will be able to lift 10 # wt from the ground with proper mechancs   Baseline unable to touch the ground    Time 6   Period Weeks   Status New               Plan - 06/10/16 1105    Clinical Impression Statement Pt significantly limited by fatigue, requested to end session early due to pain following previous session  when she worked out "too hard". PT encouraged pt to continue but she was unable to do so. Noted continued limp and difficulty with gait. Pt extremely fatigued following session.   Rehab Potential Fair   Clinical Impairments Affecting Rehab Potential motivation, PLOF/chronic pain, sedentary lifestyle   PT Frequency 2x / week   PT Duration 6 weeks   PT Treatment/Interventions Aquatic Therapy;Therapeutic activities;Therapeutic exercise;Manual techniques;Functional mobility training;Neuromuscular re-education;ADLs/Self Care Home Management;Gait training;Patient/family education   Consulted and Agree with Plan of Care Patient      Patient will benefit from skilled therapeutic intervention in order to improve the following deficits and impairments:  Abnormal gait, Decreased balance, Difficulty walking, Pain, Decreased strength, Decreased range of motion  Visit Diagnosis: Muscle weakness (generalized)     Problem List Patient Active Problem List   Diagnosis Date Noted  . Dizzy 02/12/2016  . Lymphedema 10/23/2015  . Carpal tunnel syndrome 10/23/2015  . Adverse effect of drug/medicinal 09/22/2015  . Encounter for routine gynecological examination 02/10/2015  . Routine general medical examination at a health care facility 02/07/2015  . Degenerative disc disease, lumbar 02/04/2014  . Anterolisthesis 02/04/2014  . Low back pain 02/02/2014  . Abnormal urinalysis 02/02/2014  . Postmenopausal estrogen deficiency 09/02/2013  . Febrile neutropenia ([facility]) 01/06/2013  . Breast cancer ([facility]) 01/06/2013  . Anemia of chronic disease 01/06/2013  . Thrombocytopenia, unspecified 01/06/2013  . Lacunar infarction ([facility]) 04/17/2012  . Basal ganglia infarction ([facility]) 04/17/2012  . Pure hypercholesterolemia 04/05/2008  . Essential hypertension 03/30/2008  . ANXIETY DEPRESSION 11/17/2007  . TOBACCO USE 11/17/2007  . BACK PAIN, CHRONIC 09/23/2007    AmyLeonard PT DPT 06/10/2016, 11:30 AM  [facility] [address], [place], [address] Phone: [phone]   Fax:  [phone]  Name: [name] MRN: [id number] Date of Birth: 19-Jan-1961

## 2016-06-12 ENCOUNTER — Ambulatory Visit: Payer: BLUE CROSS/BLUE SHIELD | Admitting: Physical Therapy

## 2016-06-12 DIAGNOSIS — R42 Dizziness and giddiness: Secondary | ICD-10-CM | POA: Diagnosis not present

## 2016-06-12 DIAGNOSIS — M6281 Muscle weakness (generalized): Secondary | ICD-10-CM | POA: Diagnosis not present

## 2016-06-13 NOTE — Therapy (Signed)
[facility] [address], [place], [address] Phone: [phone]   Fax:  [phone]  Physical Therapy Treatment  Patient Details  Name: Amy Leonard MRN: [id number] Date of Birth: 01-06-1961 Referring Provider: Charlette Caffey  Encounter Date: 06/12/2016      PT End of Session - 06/12/16 0703    Visit Number 5   Number of Visits 13   Date for PT Re-Evaluation 06/27/16   PT Start Time 1115   PT Stop Time 1155   PT Time Calculation (min) 40 min   Activity Tolerance Patient tolerated treatment well;Patient limited by fatigue      Past Medical History:  Diagnosis Date  . Anxiety   . Anxiety and depression   . Barrett's esophageal ulceration   . Basal ganglia infarction ([employer]) 04/17/2012  . Breast cancer ([facility]) 2014   BILATERAL LUMPECTOMY  . Cancer of breast ([facility]) 01/31/2015  . Carpal tunnel syndrome   . Chronic back pain    spinal stenosis  . Depression   . Elevated WBC count    nl diff  . Erosive gastritis   . Folliculitis [id number]  . GERD (gastroesophageal reflux disease)   . Glaucoma    ?  [place] Kitchen History of chemotherapy 2014   BREAST CA  . HLD (hyperlipidemia)   . HSV infection    recurrent (side and buttox)  . Hypertension   . Migraine   . Radiation 2014   BREAST CA  . Tobacco abuse   . Wears dentures    top    Past Surgical History:  Procedure Laterality Date  . BTL    . COLONOSCOPY    . epidural steroid injection  2008  . PARTIAL MASTECTOMY WITH NEEDLE LOCALIZATION AND AXILLARY SENTINEL LYMPH NODE BX Bilateral 11/23/2012   Procedure: PARTIAL MASTECTOMY WITH NEEDLE LOCALIZATION AND AXILLARY SENTINEL LYMPH NODE BX;  Surgeon: Adin Hector, MD;  Location: [facility];  Service: General;  Laterality: Bilateral;  . PORT-A-CATH REMOVAL Right 08/25/2013   Procedure: REMOVAL PORT-A-CATH;  Surgeon: Adin Hector, MD;  Location: [facility];  Service: General;   Laterality: Right;  . PORTACATH PLACEMENT Right 11/23/2012   Procedure: INSERTION PORT-A-CATH;  Surgeon: Adin Hector, MD;  Location: [facility];  Service: General;  Laterality: Right;  . UPPER GI ENDOSCOPY      There were no vitals filed for this visit.      Subjective Assessment - 06/12/16 0702    Subjective Pt reports she was unable to be active following previous session due to fatigue, "but I'm ready to work today".   Pertinent History Over 10 yrs of back pain prior to surgery.   Diagnostic tests multiple images prior to and after surgery   Patient Stated Goals learn how to get stronger.   Currently in Pain? No/denies   Pain Score 0-No pain               Objective: TRX sit<>stand 3x10, cuing to use UE as little as possible. Performed slowly due to challenge to musculature.  Standing hip abduction, hip extension taps, cuing to avoid compensatory trunk lean. 3x10 of each.  TG 3x10 for deep squats.   Agility ladder stepping: modA progressing to min A with this: performed slow marching, slow lateral marching, narrow BOS walking.  Pt extremely fatigued following this.  PT Education - 06/13/16 0703    Education provided Yes   Education Details balance exercises   Person(s) Educated Patient   Methods Explanation   Comprehension Verbalized understanding             PT Long Term Goals - 05/16/16 0905      PT LONG TERM GOAL #1   Title Pt will be I with HEP to improve ankle strength from 3/5 to 5/5   Baseline 3/5   Time 6   Period Weeks   Status New     PT LONG TERM GOAL #2   Title Pt will be able to perform sit<>stand with no UE support to decr. risk for falls   Baseline definite UE support and "plopping"   Time 6   Period Weeks   Status New     PT LONG TERM GOAL #3   Title Pt will be able to lift 10 # wt from the ground with proper mechancs   Baseline unable to touch the ground    Time 6   Period  Weeks   Status New               Plan - 06/12/16 0703    Clinical Impression Statement Pt had difficulty with balance related gait activities, requesting to stop but able to continue with cuing from PT, and by the end of this activity able to perform with much better confidence.   Rehab Potential Fair   Clinical Impairments Affecting Rehab Potential motivation, PLOF/chronic pain, sedentary lifestyle   PT Frequency 2x / week   PT Duration 6 weeks   PT Treatment/Interventions Aquatic Therapy;Therapeutic activities;Therapeutic exercise;Manual techniques;Functional mobility training;Neuromuscular re-education;ADLs/Self Care Home Management;Gait training;Patient/family education   Consulted and Agree with Plan of Care Patient      Patient will benefit from skilled therapeutic intervention in order to improve the following deficits and impairments:  Abnormal gait, Decreased balance, Difficulty walking, Pain, Decreased strength, Decreased range of motion  Visit Diagnosis: Dizziness and giddiness  Muscle weakness (generalized)     Problem List Patient Active Problem List   Diagnosis Date Noted  . Dizzy 02/12/2016  . Lymphedema 10/23/2015  . Carpal tunnel syndrome 10/23/2015  . Adverse effect of drug/medicinal 09/22/2015  . Encounter for routine gynecological examination 02/10/2015  . Routine general medical examination at a health care facility 02/07/2015  . Degenerative disc disease, lumbar 02/04/2014  . Anterolisthesis 02/04/2014  . Low back pain 02/02/2014  . Abnormal urinalysis 02/02/2014  . Postmenopausal estrogen deficiency 09/02/2013  . Febrile neutropenia ([facility]) 01/06/2013  . Breast cancer ([facility]) 01/06/2013  . Anemia of chronic disease 01/06/2013  . Thrombocytopenia, unspecified 01/06/2013  . Lacunar infarction ([facility]) 04/17/2012  . Basal ganglia infarction ([facility]) 04/17/2012  . Pure hypercholesterolemia 04/05/2008  . Essential hypertension 03/30/2008  . ANXIETY  DEPRESSION 11/17/2007  . TOBACCO USE 11/17/2007  . BACK PAIN, CHRONIC 09/23/2007    Fisher,Benjamin PT DPT 06/13/2016, 7:05 AM  [facility] [address] [place], [address] Phone: [phone]   Fax:  [phone]  Name: HONG VOGLER MRN: [id number] Date of Birth: 1960/11/07

## 2016-06-17 ENCOUNTER — Other Ambulatory Visit: Payer: Self-pay | Admitting: *Deleted

## 2016-06-17 MED ORDER — ANASTROZOLE 1 MG PO TABS: 1.0000 mg | ORAL_TABLET | Freq: Every day | ORAL | 0 refills | Status: DC

## 2016-06-18 ENCOUNTER — Ambulatory Visit: Payer: BLUE CROSS/BLUE SHIELD

## 2016-06-18 DIAGNOSIS — R42 Dizziness and giddiness: Secondary | ICD-10-CM | POA: Diagnosis not present

## 2016-06-18 DIAGNOSIS — M6281 Muscle weakness (generalized): Secondary | ICD-10-CM | POA: Diagnosis not present

## 2016-06-18 NOTE — Therapy (Signed)
[facility] [address], [place], [address] Phone: [phone]   Fax:  [phone]  Physical Therapy Treatment  Patient Details  Name: Amy Leonard MRN: [id number] Date of Birth: 04/09/1961 Referring Provider: Charlette Caffey  Encounter Date: 06/18/2016      PT End of Session - 06/18/16 1037    Visit Number 6   Number of Visits 13   Date for PT Re-Evaluation 06/27/16   PT Start Time 1031   PT Stop Time 1115   PT Time Calculation (min) 44 min   Activity Tolerance Patient tolerated treatment well;Patient limited by fatigue      Past Medical History:  Diagnosis Date  . Anxiety   . Anxiety and depression   . Barrett's esophageal ulceration   . 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 Breast cancer ([facility]) 2014   BILATERAL LUMPECTOMY  . Cancer of breast ([facility]) 01/31/2015  . Carpal tunnel syndrome   . Chronic back pain    spinal stenosis  . Depression   . Elevated WBC count    nl diff  . Erosive gastritis   . Folliculitis [id number]  . GERD (gastroesophageal reflux disease)   . Glaucoma    ?  Marland Kitchen History of chemotherapy 2014   BREAST CA  . HLD (hyperlipidemia)   . HSV infection    recurrent (side and buttox)  . Hypertension   . Migraine   . Radiation 2014   BREAST CA  . Tobacco abuse   . Wears dentures    top    Past Surgical History:  Procedure Laterality Date  . BTL    . COLONOSCOPY    . epidural steroid injection  2008  . PARTIAL MASTECTOMY WITH NEEDLE LOCALIZATION AND AXILLARY SENTINEL LYMPH NODE BX Bilateral 11/23/2012   Procedure: PARTIAL MASTECTOMY WITH NEEDLE LOCALIZATION AND AXILLARY SENTINEL LYMPH NODE BX;  Surgeon: Adin Hector, MD;  Location: [facility];  Service: General;  Laterality: Bilateral;  . PORT-A-CATH REMOVAL Right 08/25/2013   Procedure: REMOVAL PORT-A-CATH;  Surgeon: Adin Hector, MD;  Location: [facility];  Service: General;   Laterality: Right;  . PORTACATH PLACEMENT Right 11/23/2012   Procedure: INSERTION PORT-A-CATH;  Surgeon: Adin Hector, MD;  Location: [facility];  Service: General;  Laterality: Right;  . UPPER GI ENDOSCOPY      There were no vitals filed for this visit.      Subjective Assessment - 06/18/16 1035    Subjective Pt states that she is doing well at this time. She denies any back pain at the moment. She is not performing her HEP because she states she is "lazy." No specific questions or concerns at this time.    Pertinent History Over 10 yrs of back pain prior to surgery.   Diagnostic tests multiple images prior to and after surgery   Patient Stated Goals learn how to get stronger.   Currently in Pain? No/denies        TREATMENT     Objective:  TG partial squats 3x20 at setting 22; TG heel raises 3x20 at setting 18 due to significant L ankle plantarflexor weakness; Standing hip abduction, hip extension, cuing to avoid compensatory trunk lean 3x10 of each. Forward BOSU lunges 2 x 10 bilaterally; Supine SLR with cuing for TA contraction 3 x 10 bilaterally;  Pt required cuing throughout to address control, posture, where to feel muscle activation. HEP reinforced and education about  importance;                         PT Education - 06/18/16 1037    Education provided Yes   Education Details Reinforced importance of HEP. Did not progress since patient is not consistent with current program   Person(s) Educated Patient   Methods Explanation   Comprehension Verbalized understanding             PT Long Term Goals - 05/16/16 0905      PT LONG TERM GOAL #1   Title Pt will be I with HEP to improve ankle strength from 3/5 to 5/5   Baseline 3/5   Time 6   Period Weeks   Status New     PT LONG TERM GOAL #2   Title Pt will be able to perform sit<>stand with no UE support to decr. risk for falls   Baseline definite UE support and  "plopping"   Time 6   Period Weeks   Status New     PT LONG TERM GOAL #3   Title Pt will be able to lift 10 # wt from the ground with proper mechancs   Baseline unable to touch the ground    Time 6   Period Weeks   Status New               Plan - 06/18/16 1038    Clinical Impression Statement Pt demonstrates some decreased balance with forward lunges on BOSU. She has considerable weakness in L ankle plantarflexion and eversion. Pt requires cues throughout exercises for proper technique and to avoid compensation. Encouraged to continue HEP. Attempted to determine why it has been difficulty for patient to be consistent with HEP but she is unable to provide useful input except that "I am lazy." Pt advised to follow-up as scheduled.    Rehab Potential Fair   Clinical Impairments Affecting Rehab Potential motivation, PLOF/chronic pain, sedentary lifestyle   PT Frequency 2x / week   PT Duration 6 weeks   PT Treatment/Interventions Aquatic Therapy;Therapeutic activities;Therapeutic exercise;Manual techniques;Functional mobility training;Neuromuscular re-education;ADLs/Self Care Home Management;Gait training;Patient/family education   PT Next Visit Plan Continue with strength and balance exercises.    PT Home Exercise Plan Continue as prescribed   Consulted and Agree with Plan of Care Patient      Patient will benefit from skilled therapeutic intervention in order to improve the following deficits and impairments:  Abnormal gait, Decreased balance, Difficulty walking, Pain, Decreased strength, Decreased range of motion  Visit Diagnosis: Muscle weakness (generalized)     Problem List Patient Active Problem List   Diagnosis Date Noted  . Dizzy 02/12/2016  . Lymphedema 10/23/2015  . Carpal tunnel syndrome 10/23/2015  . Adverse effect of drug/medicinal 09/22/2015  . Encounter for routine gynecological examination 02/10/2015  . Routine general medical examination at a health care  facility 02/07/2015  . Degenerative disc disease, lumbar 02/04/2014  . Anterolisthesis 02/04/2014  . Low back pain 02/02/2014  . Abnormal urinalysis 02/02/2014  . Postmenopausal estrogen deficiency 09/02/2013  . Febrile neutropenia ([facility]) 01/06/2013  . Breast cancer ([facility]) 01/06/2013  . Anemia of chronic disease 01/06/2013  . Thrombocytopenia, unspecified 01/06/2013  . 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 Pure hypercholesterolemia 04/05/2008  . Essential hypertension 03/30/2008  . ANXIETY DEPRESSION 11/17/2007  . TOBACCO USE 11/17/2007  . BACK PAIN, CHRONIC 09/23/2007   Phillips Grout PT, DPT  Huprich,Jason 06/19/2016, 1:15 PM   [facility] [address] [place], [address] Phone: [phone]   Fax:  [phone]  Name: YADIRA THEOBALD MRN: [id number] Date of Birth: 08/24/1961

## 2016-06-21 ENCOUNTER — Ambulatory Visit: Payer: BLUE CROSS/BLUE SHIELD | Admitting: Physical Therapy

## 2016-06-21 DIAGNOSIS — M6281 Muscle weakness (generalized): Secondary | ICD-10-CM | POA: Diagnosis not present

## 2016-06-21 DIAGNOSIS — R42 Dizziness and giddiness: Secondary | ICD-10-CM | POA: Diagnosis not present

## 2016-06-21 NOTE — Therapy (Signed)
[facility] [address] [place], [address] Phone: [phone]   Fax:  [phone]  Physical Therapy Treatment  Patient Details  Name: Amy Leonard MRN: [id number] Date of Birth: 1961-08-04 Referring Provider: Charlette Caffey  Encounter Date: 06/21/2016      PT End of Session - 06/21/16 1116    Visit Number 7   Number of Visits 13   Date for PT Re-Evaluation 06/27/16   PT Start Time [id number]   PT Stop Time 1130   PT Time Calculation (min) 38 min   Activity Tolerance Patient tolerated treatment well;Patient limited by fatigue      Past Medical History:  Diagnosis Date  . Anxiety   . Anxiety and depression   . Barrett's esophageal ulceration   . 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 Breast cancer ([facility]) 2014   BILATERAL LUMPECTOMY  . Cancer of breast ([facility]) 01/31/2015  . Carpal tunnel syndrome   . Chronic back pain    spinal stenosis  . Depression   . Elevated WBC count    nl diff  . Erosive gastritis   . Folliculitis [id number]  . GERD (gastroesophageal reflux disease)   . Glaucoma    ?  Marland Kitchen History of chemotherapy 2014   BREAST CA  . HLD (hyperlipidemia)   . HSV infection    recurrent (side and buttox)  . Hypertension   . Migraine   . Radiation 2014   BREAST CA  . Tobacco abuse   . Wears dentures    top    Past Surgical History:  Procedure Laterality Date  . BTL    . COLONOSCOPY    . epidural steroid injection  2008  . PARTIAL MASTECTOMY WITH NEEDLE LOCALIZATION AND AXILLARY SENTINEL LYMPH NODE BX Bilateral 11/23/2012   Procedure: PARTIAL MASTECTOMY WITH NEEDLE LOCALIZATION AND AXILLARY SENTINEL LYMPH NODE BX;  Surgeon: Adin Hector, MD;  Location: [facility];  Service: General;  Laterality: Bilateral;  . PORT-A-CATH REMOVAL Right 08/25/2013   Procedure: REMOVAL PORT-A-CATH;  Surgeon: Adin Hector, MD;  Location: [facility];  Service: General;   Laterality: Right;  . PORTACATH PLACEMENT Right 11/23/2012   Procedure: INSERTION PORT-A-CATH;  Surgeon: Adin Hector, MD;  Location: [facility];  Service: General;  Laterality: Right;  . UPPER GI ENDOSCOPY      There were no vitals filed for this visit.      Subjective Assessment - 06/21/16 1056    Subjective Pt reports incr. knee pain over the past two days due to previous session, it is feeling better today.    Pertinent History Over 10 yrs of back pain prior to surgery.   Diagnostic tests multiple images prior to and after surgery   Patient Stated Goals learn how to get stronger.   Currently in Pain? No/denies               Objective: Focus of session on return to work activities: amb x 6 min changing directions, cuing for incr. Speed. Able to amb 1000', required rest break following this.  3x10 sit<>stand with no UE support, 2nd two rounds while holding weight to mimic holding hardware at work.  Up and down from the ground onto airex pad, performed with definite UE support. Able to perform well though extremely fatiguing for pt requiring extended rest breaks. 2x10.  Stoop lifts with cuing for neutral spine to minimize potential irritation with this.  Pt verbalized she was fatigued following session.                  PT Education - 06/21/16 1115    Education provided Yes   Education Details Work hardening   Person(s) Educated Patient   Methods Explanation   Comprehension Verbalized understanding             PT Long Term Goals - 05/16/16 0905      PT LONG TERM GOAL #1   Title Pt will be I with HEP to improve ankle strength from 3/5 to 5/5   Baseline 3/5   Time 6   Period Weeks   Status New     PT LONG TERM GOAL #2   Title Pt will be able to perform sit<>stand with no UE support to decr. risk for falls   Baseline definite UE support and "plopping"   Time 6   Period Weeks   Status New     PT LONG TERM GOAL #3    Title Pt will be able to lift 10 # wt from the ground with proper mechancs   Baseline unable to touch the ground    Time 6   Period Weeks   Status New               Plan - 06/21/16 1128    Clinical Impression Statement Focus of session on return to work related activities, pt able to perform 2x10 ground to standing but extremely fatigued following this. Continues to be significantly weak in L ankle and requires UE support for change sin elevation.   Rehab Potential Fair   Clinical Impairments Affecting Rehab Potential motivation, PLOF/chronic pain, sedentary lifestyle   PT Frequency 2x / week   PT Duration 6 weeks   PT Treatment/Interventions Aquatic Therapy;Therapeutic activities;Therapeutic exercise;Manual techniques;Functional mobility training;Neuromuscular re-education;ADLs/Self Care Home Management;Gait training;Patient/family education   PT Next Visit Plan Continue with strength and balance exercises.    PT Home Exercise Plan Continue as prescribed   Consulted and Agree with Plan of Care Patient      Patient will benefit from skilled therapeutic intervention in order to improve the following deficits and impairments:  Abnormal gait, Decreased balance, Difficulty walking, Pain, Decreased strength, Decreased range of motion  Visit Diagnosis: Muscle weakness (generalized)     Problem List Patient Active Problem List   Diagnosis Date Noted  . Dizzy 02/12/2016  . Lymphedema 10/23/2015  . Carpal tunnel syndrome 10/23/2015  . Adverse effect of drug/medicinal 09/22/2015  . Encounter for routine gynecological examination 02/10/2015  . Routine general medical examination at a health care facility 02/07/2015  . Degenerative disc disease, lumbar 02/04/2014  . Anterolisthesis 02/04/2014  . Low back pain 02/02/2014  . Abnormal urinalysis 02/02/2014  . Postmenopausal estrogen deficiency 09/02/2013  . Febrile neutropenia ([facility]) 01/06/2013  . Breast cancer ([facility]) 01/06/2013  .  Anemia of chronic disease 01/06/2013  . Thrombocytopenia, unspecified 01/06/2013  . 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 Pure hypercholesterolemia 04/05/2008  . Essential hypertension 03/30/2008  . ANXIETY DEPRESSION 11/17/2007  . TOBACCO USE 11/17/2007  . BACK PAIN, CHRONIC 09/23/2007    Lexie Koehl PT DPT 06/21/2016, 11:29 AM  [facility] [address] [place], [address] Phone: [phone]   Fax:  [phone]  Name: Amy Leonard MRN: [id number] Date of Birth: 19-Feb-1961

## 2016-06-24 ENCOUNTER — Ambulatory Visit: Payer: BLUE CROSS/BLUE SHIELD | Admitting: Physical Therapy

## 2016-06-24 DIAGNOSIS — M6281 Muscle weakness (generalized): Secondary | ICD-10-CM | POA: Diagnosis not present

## 2016-06-24 DIAGNOSIS — R42 Dizziness and giddiness: Secondary | ICD-10-CM | POA: Diagnosis not present

## 2016-06-24 NOTE — Therapy (Signed)
[facility] [address] [place], [address] Phone: [phone]   Fax:  [phone]  Physical Therapy Treatment  Patient Details  Name: Amy Leonard MRN: [id number] Date of Birth: [birth year] Referring Provider: Charlette Caffey  Encounter Date: 06/24/2016      PT End of Session - 06/24/16 1201    Visit Number 8   Number of Visits 13   Date for PT Re-Evaluation 06/27/16   PT Start Time [id number]   PT Stop Time 1220   PT Time Calculation (min) 24 min   Activity Tolerance Patient tolerated treatment well;Patient limited by fatigue      Past Medical History:  Diagnosis Date  . Anxiety   . Anxiety and depression   . Barrett's esophageal ulceration   . 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 Breast cancer ([facility]) 2014   BILATERAL LUMPECTOMY  . Cancer of breast ([facility]) 01/31/2015  . Carpal tunnel syndrome   . Chronic back pain    spinal stenosis  . Depression   . Elevated WBC count    nl diff  . Erosive gastritis   . Folliculitis [id number]  . GERD (gastroesophageal reflux disease)   . Glaucoma    ?  Marland Kitchen History of chemotherapy 2014   BREAST CA  . HLD (hyperlipidemia)   . HSV infection    recurrent (side and buttox)  . Hypertension   . Migraine   . Radiation 2014   BREAST CA  . Tobacco abuse   . Wears dentures    top    Past Surgical History:  Procedure Laterality Date  . BTL    . COLONOSCOPY    . epidural steroid injection  2008  . PARTIAL MASTECTOMY WITH NEEDLE LOCALIZATION AND AXILLARY SENTINEL LYMPH NODE BX Bilateral 11/23/2012   Procedure: PARTIAL MASTECTOMY WITH NEEDLE LOCALIZATION AND AXILLARY SENTINEL LYMPH NODE BX;  Surgeon: Adin Hector, MD;  Location: [facility];  Service: General;  Laterality: Bilateral;  . PORT-A-CATH REMOVAL Right 08/25/2013   Procedure: REMOVAL PORT-A-CATH;  Surgeon: Adin Hector, MD;  Location: [facility];  Service: General;   Laterality: Right;  . PORTACATH PLACEMENT Right 11/23/2012   Procedure: INSERTION PORT-A-CATH;  Surgeon: Adin Hector, MD;  Location: [facility];  Service: General;  Laterality: Right;  . UPPER GI ENDOSCOPY      There were no vitals filed for this visit.      Subjective Assessment - 06/24/16 1201    Subjective Pt reports following her previous session she was unable to move for several days due to significant pain/fatigue.   Pertinent History Over 10 yrs of back pain prior to surgery.   Diagnostic tests multiple images prior to and after surgery   Patient Stated Goals learn how to get stronger.   Currently in Pain? No/denies   Pain Score 0-No pain           Objective: Pt arrived late for session and requested to end slightly early. Requested decr. Stress with exercise.  TG full squats initially at seat setting 20, unable to perform so performed 3x20 at seat setting 18.  TG single leg squats at seat setting 11. 3x5 performed, performed B. With TG exercises required cuing for knee tracking.  Amb in clinic, noted decr. Clearance on L.   Performed standing hip flexion (high knees) with minimal UE support but definite UE support, with cuing to maintain ankle control with maintaining positions  for 5 sec. Holds to activate ankle control strategies.  Amb in clinic with cuing to avoid scuffing.  Following this pt had incr. BOS with gait due to fatigue.                           PT Long Term Goals - 05/16/16 0905      PT LONG TERM GOAL #1   Title Pt will be I with HEP to improve ankle strength from 3/5 to 5/5   Baseline 3/5   Time 6   Period Weeks   Status New     PT LONG TERM GOAL #2   Title Pt will be able to perform sit<>stand with no UE support to decr. risk for falls   Baseline definite UE support and "plopping"   Time 6   Period Weeks   Status New     PT LONG TERM GOAL #3   Title Pt will be able to lift 10 # wt from the  ground with proper mechancs   Baseline unable to touch the ground    Time 6   Period Weeks   Status New               Plan - 06/24/16 1202    Clinical Impression Statement decr. resistance exercises due to significant fatigue with previous exercises. Continuing to focus on improving strength to allow pt to return to work.    Rehab Potential Fair   Clinical Impairments Affecting Rehab Potential motivation, PLOF/chronic pain, sedentary lifestyle   PT Frequency 2x / week   PT Duration 6 weeks   PT Treatment/Interventions Aquatic Therapy;Therapeutic activities;Therapeutic exercise;Manual techniques;Functional mobility training;Neuromuscular re-education;ADLs/Self Care Home Management;Gait training;Patient/family education   PT Next Visit Plan Continue with strength and balance exercises.    PT Home Exercise Plan Continue as prescribed   Consulted and Agree with Plan of Care Patient      Patient will benefit from skilled therapeutic intervention in order to improve the following deficits and impairments:  Abnormal gait, Decreased balance, Difficulty walking, Pain, Decreased strength, Decreased range of motion  Visit Diagnosis: Muscle weakness (generalized)     Problem List Patient Active Problem List   Diagnosis Date Noted  . Dizzy 02/12/2016  . Lymphedema 10/23/2015  . Carpal tunnel syndrome 10/23/2015  . Adverse effect of drug/medicinal 09/22/2015  . Encounter for routine gynecological examination 02/10/2015  . Routine general medical examination at a health care facility 02/07/2015  . Degenerative disc disease, lumbar 02/04/2014  . Anterolisthesis 02/04/2014  . Low back pain 02/02/2014  . Abnormal urinalysis 02/02/2014  . Postmenopausal estrogen deficiency 09/02/2013  . Febrile neutropenia ([facility]) 01/06/2013  . Breast cancer ([facility]) 01/06/2013  . Anemia of chronic disease 01/06/2013  . Thrombocytopenia, unspecified 01/06/2013  . 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 Pure hypercholesterolemia 04/05/2008  . Essential hypertension 03/30/2008  . ANXIETY DEPRESSION 11/17/2007  . TOBACCO USE 11/17/2007  . BACK PAIN, CHRONIC 09/23/2007    Fisher,Benjamin PT DPT 06/24/2016, 12:55 PM  [facility] [address], [place], [address] Phone: [phone]   Fax:  [phone]  Name: Amy Leonard MRN: [id number] Date of Birth: 01/30/1961

## 2016-06-26 ENCOUNTER — Ambulatory Visit: Payer: BLUE CROSS/BLUE SHIELD | Attending: Gastroenterology | Admitting: Physical Therapy

## 2016-06-26 DIAGNOSIS — M6281 Muscle weakness (generalized): Secondary | ICD-10-CM | POA: Diagnosis not present

## 2016-06-26 DIAGNOSIS — R42 Dizziness and giddiness: Secondary | ICD-10-CM | POA: Diagnosis not present

## 2016-06-27 NOTE — Therapy (Signed)
[facility] [address] [place], [address] Phone: [phone]   Fax:  [phone]  Physical Therapy Treatment  Patient Details  Name: Amy Leonard MRN: [id number] Date of Birth: [birth year] Referring Provider: Charlette Caffey  Encounter Date: 06/26/2016      PT End of Session - 06/26/16 0739    Visit Number 9   Number of Visits 13   Date for PT Re-Evaluation 06/27/16   PT Start Time 1030   PT Stop Time 1110   PT Time Calculation (min) 40 min   Activity Tolerance Patient tolerated treatment well;Patient limited by fatigue      Past Medical History:  Diagnosis Date  . Anxiety   . Anxiety and depression   . Barrett's esophageal ulceration   . 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 Breast cancer ([place]) 2014   BILATERAL LUMPECTOMY  . Cancer of breast ([place]) 01/31/2015  . Carpal tunnel syndrome   . Chronic back pain    spinal stenosis  . Depression   . Elevated WBC count    nl diff  . Erosive gastritis   . Folliculitis [id number]  . GERD (gastroesophageal reflux disease)   . Glaucoma    ?  Marland Kitchen History of chemotherapy 2014   BREAST CA  . HLD (hyperlipidemia)   . HSV infection    recurrent (side and buttox)  . Hypertension   . Migraine   . Radiation 2014   BREAST CA  . Tobacco abuse   . Wears dentures    top    Past Surgical History:  Procedure Laterality Date  . BTL    . COLONOSCOPY    . epidural steroid injection  2008  . PARTIAL MASTECTOMY WITH NEEDLE LOCALIZATION AND AXILLARY SENTINEL LYMPH NODE BX Bilateral 11/23/2012   Procedure: PARTIAL MASTECTOMY WITH NEEDLE LOCALIZATION AND AXILLARY SENTINEL LYMPH NODE BX;  Surgeon: Adin Hector, MD;  Location: [facility];  Service: General;  Laterality: Bilateral;  . PORT-A-CATH REMOVAL Right 08/25/2013   Procedure: REMOVAL PORT-A-CATH;  Surgeon: Adin Hector, MD;  Location: [facility];  Service: General;   Laterality: Right;  . PORTACATH PLACEMENT Right 11/23/2012   Procedure: INSERTION PORT-A-CATH;  Surgeon: Adin Hector, MD;  Location: [facility];  Service: General;  Laterality: Right;  . UPPER GI ENDOSCOPY      There were no vitals filed for this visit.      Subjective Assessment - 06/26/16 0739    Subjective Pt reports no dificulties following previous session, no c/o falls.   Pertinent History Over 10 yrs of back pain prior to surgery.   Diagnostic tests multiple images prior to and after surgery   Patient Stated Goals learn how to get stronger.              Objective: Standing slow marching focusing on ankle control. 3x1 min of this matched with 200' amb in clinic focusing on slow and controled gait.  Standing fire hydrants 3x20.  Mini partial lunges, focusing on using both LE in a symmetrical manner. Pt had some c/o difficulty with this intially but with cuing and use of mirror to show difficulty pt able to work on this.   Sit<>stands with cuing to tap surface of seat but not to sit fully. Performed 3x10 with 4 sec. Descent, 2 sec. Ascent, avoiding fully locking out to improve cardiovascular response to treatment.  TG squats, attempted to progress to 24  for seat setting but pt fatigued, requiring seat setting 22, 3x20.  Pt requested to end session following this due to fatigue. Reports she is doing her HEP and will add in partial lunges.                        PT Long Term Goals - 05/16/16 0905      PT LONG TERM GOAL #1   Title Pt will be I with HEP to improve ankle strength from 3/5 to 5/5   Baseline 3/5   Time 6   Period Weeks   Status New     PT LONG TERM GOAL #2   Title Pt will be able to perform sit<>stand with no UE support to decr. risk for falls   Baseline definite UE support and "plopping"   Time 6   Period Weeks   Status New     PT LONG TERM GOAL #3   Title Pt will be able to lift 10 # wt from the ground with  proper mechancs   Baseline unable to touch the ground    Time 6   Period Weeks   Status New               Plan - 06/26/16 0740    Clinical Impression Statement Incr. resistance, trying to find level where pt gets training effect/strengthening while not flaring up pt's inability to perform regular home related activities. Pt tolerated session well though with final exercise of TG pt did have some difficulty and reported she would "feel it later".   Rehab Potential Fair   Clinical Impairments Affecting Rehab Potential motivation, PLOF/chronic pain, sedentary lifestyle   PT Frequency 2x / week   PT Duration 6 weeks   PT Treatment/Interventions Aquatic Therapy;Therapeutic activities;Therapeutic exercise;Manual techniques;Functional mobility training;Neuromuscular re-education;ADLs/Self Care Home Management;Gait training;Patient/family education   PT Next Visit Plan Continue with strength and balance exercises.    PT Home Exercise Plan Continue as prescribed   Consulted and Agree with Plan of Care Patient      Patient will benefit from skilled therapeutic intervention in order to improve the following deficits and impairments:  Abnormal gait, Decreased balance, Difficulty walking, Pain, Decreased strength, Decreased range of motion  Visit Diagnosis: Muscle weakness (generalized)     Problem List Patient Active Problem List   Diagnosis Date Noted  . Dizzy 02/12/2016  . Lymphedema 10/23/2015  . Carpal tunnel syndrome 10/23/2015  . Adverse effect of drug/medicinal 09/22/2015  . Encounter for routine gynecological examination 02/10/2015  . Routine general medical examination at a health care facility 02/07/2015  . Degenerative disc disease, lumbar 02/04/2014  . Anterolisthesis 02/04/2014  . Low back pain 02/02/2014  . Abnormal urinalysis 02/02/2014  . Postmenopausal estrogen deficiency 09/02/2013  . Febrile neutropenia ([place] [facility]) 01/06/2013  . Breast cancer ([facility]) 01/06/2013  .  Anemia of chronic disease 01/06/2013  . Thrombocytopenia, unspecified 01/06/2013  . 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 Pure hypercholesterolemia 04/05/2008  . Essential hypertension 03/30/2008  . ANXIETY DEPRESSION 11/17/2007  . TOBACCO USE 11/17/2007  . BACK PAIN, CHRONIC 09/23/2007    Amy Leonard 06/27/2016, 7:42 AM  [facility] [address] [place], [address] Phone: [phone]   Fax:  [phone]  Name: Amy Leonard MRN: [id number] Date of Birth: Jul 16, 1961

## 2016-07-01 ENCOUNTER — Ambulatory Visit: Payer: BLUE CROSS/BLUE SHIELD | Admitting: Physical Therapy

## 2016-07-01 DIAGNOSIS — R42 Dizziness and giddiness: Secondary | ICD-10-CM | POA: Diagnosis not present

## 2016-07-01 DIAGNOSIS — M6281 Muscle weakness (generalized): Secondary | ICD-10-CM

## 2016-07-02 NOTE — Therapy (Signed)
[facility] [address], [place], [address] Phone: [phone]   Fax:  [phone]  Physical Therapy Treatment  Patient Details  Name: Amy Leonard MRN: [id number] Date of Birth: [birth year] Referring Provider: Charlette Caffey  Encounter Date: 07/01/2016      PT End of Session - 07/01/16 0734    Visit Number 10   Number of Visits 13   Date for PT Re-Evaluation 06/27/16   PT Start Time 8828   PT Stop Time 1040   PT Time Calculation (min) 38 min   Activity Tolerance Patient tolerated treatment well;Patient limited by fatigue      Past Medical History:  Diagnosis Date  . Anxiety   . Anxiety and depression   . Barrett's esophageal ulceration   . 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 Breast cancer ([facility]) 2014   BILATERAL LUMPECTOMY  . Cancer of breast ([facility]) 01/31/2015  . Carpal tunnel syndrome   . Chronic back pain    spinal stenosis  . Depression   . Elevated WBC count    nl diff  . Erosive gastritis   . Folliculitis 0/0/3491  . GERD (gastroesophageal reflux disease)   . Glaucoma    ?  Marland Kitchen History of chemotherapy 2014   BREAST CA  . HLD (hyperlipidemia)   . HSV infection    recurrent (side and buttox)  . Hypertension   . Migraine   . Radiation 2014   BREAST CA  . Tobacco abuse   . Wears dentures    top    Past Surgical History:  Procedure Laterality Date  . BTL    . COLONOSCOPY    . epidural steroid injection  2008  . PARTIAL MASTECTOMY WITH NEEDLE LOCALIZATION AND AXILLARY SENTINEL LYMPH NODE BX Bilateral 11/23/2012   Procedure: PARTIAL MASTECTOMY WITH NEEDLE LOCALIZATION AND AXILLARY SENTINEL LYMPH NODE BX;  Surgeon: Adin Hector, MD;  Location: [facility];  Service: General;  Laterality: Bilateral;  . PORT-A-CATH REMOVAL Right 08/25/2013   Procedure: REMOVAL PORT-A-CATH;  Surgeon: Adin Hector, MD;  Location: [facility];  Service: General;   Laterality: Right;  . PORTACATH PLACEMENT Right 11/23/2012   Procedure: INSERTION PORT-A-CATH;  Surgeon: Adin Hector, MD;  Location: [facility];  Service: General;  Laterality: Right;  . UPPER GI ENDOSCOPY      There were no vitals filed for this visit.      Subjective Assessment - 07/01/16 0733    Subjective Pt was fatigued following previous session, is noting some difficulty in walking on uneven surfaces which she had to do over the weekend.   Pertinent History Over 10 yrs of back pain prior to surgery.   Diagnostic tests multiple images prior to and after surgery   Patient Stated Goals learn how to get stronger.   Currently in Pain? No/denies          'Objective: Standing on uneven surface, 3x1 min with normal BOS, 3x1 min with narrow BOS, cuing throughout this for side to side head rotation, trunk turning. Required SBA for this except with turning trunk in sharpened position required one instance of mod A to maintain balance. Pt reported fatigue in ankles following this.  On uneven surface peformed lateral walking with UE support as needed, 3x10 reps of 8' in either direction.  OMEGA leg press with wt set at 55#, this was heaviest wt pt able to tolerate, performed 3x5 with extensive  rest breaks between sets, cuing for back positioning and assistance for initial press.  Pt extremely fatigued following session, able to amb out to car with incr. BOS for balance.                       PT Education - 07/01/16 0733    Education provided Yes   Education Details balance/uneven surface training   Person(s) Educated Patient   Methods Explanation   Comprehension Verbalized understanding             PT Long Term Goals - 07/01/16 0735      PT LONG TERM GOAL #1   Title Pt will be I with HEP to improve ankle strength from 3/5 to 5/5   Baseline 4/5   Time 6   Period Weeks   Status Not Met     PT LONG TERM GOAL #2   Title Pt will be  able to perform sit<>stand with no UE support to decr. risk for falls   Baseline definite UE support and "plopping"   Time 6   Period Weeks   Status Not Met     PT LONG TERM GOAL #3   Title Pt will be able to lift 10 # wt from the ground with proper mechancs   Baseline unable to touch the ground    Time 6   Period Weeks   Status Not Met               Plan - 07/01/16 0734    Clinical Impression Statement modified focus to uneven surfaces,continued to address ankle strength which appears to be continuing to improve. Overall pt is still highly limited in activity tolerance. she has been inconsistent with exercising at home but will try to be more consistent in the future.   Rehab Potential Fair   Clinical Impairments Affecting Rehab Potential motivation, PLOF/chronic pain, sedentary lifestyle   PT Frequency 2x / week   PT Duration 6 weeks   PT Treatment/Interventions Aquatic Therapy;Therapeutic activities;Therapeutic exercise;Manual techniques;Functional mobility training;Neuromuscular re-education;ADLs/Self Care Home Management;Gait training;Patient/family education   PT Next Visit Plan Continue with strength and balance exercises.    PT Home Exercise Plan Continue as prescribed   Consulted and Agree with Plan of Care Patient      Patient will benefit from skilled therapeutic intervention in order to improve the following deficits and impairments:  Abnormal gait, Decreased balance, Difficulty walking, Pain, Decreased strength, Decreased range of motion  Visit Diagnosis: Muscle weakness (generalized) - Plan: PT plan of care cert/re-cert  Dizziness and giddiness - Plan: PT plan of care cert/re-cert     Problem List Patient Active Problem List   Diagnosis Date Noted  . Dizzy 02/12/2016  . Lymphedema 10/23/2015  . Carpal tunnel syndrome 10/23/2015  . Adverse effect of drug/medicinal 09/22/2015  . Encounter for routine gynecological examination 02/10/2015  . Routine  general medical examination at a health care facility 02/07/2015  . Degenerative disc disease, lumbar 02/04/2014  . Anterolisthesis 02/04/2014  . Low back pain 02/02/2014  . Abnormal urinalysis 02/02/2014  . Postmenopausal estrogen deficiency 09/02/2013  . Febrile neutropenia ([facility]) 01/06/2013  . Breast cancer ([facility]) 01/06/2013  . Anemia of chronic disease 01/06/2013  . Thrombocytopenia, unspecified 01/06/2013  . 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 Pure hypercholesterolemia 04/05/2008  . Essential hypertension 03/30/2008  . ANXIETY DEPRESSION 11/17/2007  . TOBACCO USE 11/17/2007  . BACK PAIN, CHRONIC 09/23/2007  Kemper Hochman PT DPT 07/02/2016, 7:38 AM  [facility] [address] [place], [address] Phone: [phone]   Fax:  [phone]  Name: Amy Leonard MRN: [id number] Date of Birth: [birth year]

## 2016-07-15 ENCOUNTER — Ambulatory Visit: Payer: BLUE CROSS/BLUE SHIELD | Admitting: Physical Therapy

## 2016-07-15 DIAGNOSIS — R42 Dizziness and giddiness: Secondary | ICD-10-CM | POA: Diagnosis not present

## 2016-07-15 DIAGNOSIS — M6281 Muscle weakness (generalized): Secondary | ICD-10-CM

## 2016-07-16 NOTE — Therapy (Signed)
[facility] [address], [place], [address] Phone: [phone]   Fax:  [phone]  Physical Therapy Treatment  Patient Details  Name: Amy Leonard MRN: [id number] Date of Birth: Aug 02, 1961 Referring Provider: Charlette Caffey  Encounter Date: 07/15/2016      PT End of Session - 07/15/16 1454    Visit Number 11   Number of Visits 13   Date for PT Re-Evaluation 08/19/16   PT Start Time 1450   PT Stop Time 1530   PT Time Calculation (min) 40 min   Activity Tolerance Patient tolerated treatment well;Patient limited by fatigue      Past Medical History:  Diagnosis Date  . Anxiety   . Anxiety and depression   . Barrett's esophageal ulceration   . 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 Breast cancer ([facility]) 2014   BILATERAL LUMPECTOMY  . Cancer of breast ([facility]) 01/31/2015  . Carpal tunnel syndrome   . Chronic back pain    spinal stenosis  . Depression   . Elevated WBC count    nl diff  . Erosive gastritis   . Folliculitis 12/25/7614  . GERD (gastroesophageal reflux disease)   . Glaucoma    ?  Marland Kitchen History of chemotherapy 2014   BREAST CA  . HLD (hyperlipidemia)   . HSV infection    recurrent (side and buttox)  . Hypertension   . Migraine   . Radiation 2014   BREAST CA  . Tobacco abuse   . Wears dentures    top    Past Surgical History:  Procedure Laterality Date  . BTL    . COLONOSCOPY    . epidural steroid injection  2008  . PARTIAL MASTECTOMY WITH NEEDLE LOCALIZATION AND AXILLARY SENTINEL LYMPH NODE BX Bilateral 11/23/2012   Procedure: PARTIAL MASTECTOMY WITH NEEDLE LOCALIZATION AND AXILLARY SENTINEL LYMPH NODE BX;  Surgeon: Adin Hector, MD;  Location: [facility];  Service: General;  Laterality: Bilateral;  . PORT-A-CATH REMOVAL Right 08/25/2013   Procedure: REMOVAL PORT-A-CATH;  Surgeon: Adin Hector, MD;  Location: [facility];  Service: General;   Laterality: Right;  . PORTACATH PLACEMENT Right 11/23/2012   Procedure: INSERTION PORT-A-CATH;  Surgeon: Adin Hector, MD;  Location: [facility];  Service: General;  Laterality: Right;  . UPPER GI ENDOSCOPY      There were no vitals filed for this visit.      Subjective Assessment - 07/15/16 1452    Subjective Pt reports she is beginning to feel stronger in her LE.   Pertinent History Over 10 yrs of back pain prior to surgery.   Diagnostic tests multiple images prior to and after surgery   Patient Stated Goals learn how to get stronger.   Currently in Pain? No/denies            Objective: Heel raises performed B with UE support on stairs to achieve full ROM, 3x15. Pt reported feeling the ball of the foot on L, B calves working.  Toe taps with newly strengthened ankle DF standing 3x20 performed on each side.  amb in clinic carrying 8# wt 3x200' to mimic work activity.  Step ups on 8" step while carrying 8# wt x20, same with added reach overhead with wt. Performed B.  Rest break required following this.  Amb in clinic carrying 10# box 3x200'.  Pt fatigued following this, did not press strengthening further as pt returns to work  tomorrow. PT answered pt questions regarding return to work.                           PT Long Term Goals - 07/01/16 0735      PT LONG TERM GOAL #1   Title Pt will be I with HEP to improve ankle strength from 3/5 to 5/5   Baseline 4/5   Time 6   Period Weeks   Status Not Met     PT LONG TERM GOAL #2   Title Pt will be able to perform sit<>stand with no UE support to decr. risk for falls   Baseline definite UE support and "plopping"   Time 6   Period Weeks   Status Not Met     PT LONG TERM GOAL #3   Title Pt will be able to lift 10 # wt from the ground with proper mechancs   Baseline unable to touch the ground    Time 6   Period Weeks   Status Not Met               Plan - 07/15/16 1454     Clinical Impression Statement Pt has 3/5 strength in L ankle now. strength has generally improved for pt with LE exercises. Deferred aggressive training today due to pt needing to return to work tomorrow.   Rehab Potential Fair   Clinical Impairments Affecting Rehab Potential motivation, PLOF/chronic pain, sedentary lifestyle   PT Frequency 2x / week   PT Duration 6 weeks   PT Treatment/Interventions Aquatic Therapy;Therapeutic activities;Therapeutic exercise;Manual techniques;Functional mobility training;Neuromuscular re-education;ADLs/Self Care Home Management;Gait training;Patient/family education   PT Next Visit Plan Continue with strength and balance exercises.    PT Home Exercise Plan Continue as prescribed   Consulted and Agree with Plan of Care Patient      Patient will benefit from skilled therapeutic intervention in order to improve the following deficits and impairments:  Abnormal gait, Decreased balance, Difficulty walking, Pain, Decreased strength, Decreased range of motion  Visit Diagnosis: Muscle weakness (generalized)     Problem List Patient Active Problem List   Diagnosis Date Noted  . Dizzy 02/12/2016  . Lymphedema 10/23/2015  . Carpal tunnel syndrome 10/23/2015  . Adverse effect of drug/medicinal 09/22/2015  . Encounter for routine gynecological examination 02/10/2015  . Routine general medical examination at a health care facility 02/07/2015  . Degenerative disc disease, lumbar 02/04/2014  . Anterolisthesis 02/04/2014  . Low back pain 02/02/2014  . Abnormal urinalysis 02/02/2014  . Postmenopausal estrogen deficiency 09/02/2013  . Febrile neutropenia ([facility]) 01/06/2013  . Breast cancer ([facility]) 01/06/2013  . Anemia of chronic disease 01/06/2013  . Thrombocytopenia, unspecified 01/06/2013  . 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 Pure hypercholesterolemia 04/05/2008  . Essential hypertension 03/30/2008  . ANXIETY  DEPRESSION 11/17/2007  . TOBACCO USE 11/17/2007  . BACK PAIN, CHRONIC 09/23/2007    Fisher,Benjamin PT DPT 07/16/2016, 6:33 AM  [facility] [address], [place], [address] Phone: [phone]   Fax:  [phone]  Name: Amy Leonard MRN: [id number] Date of Birth: 01/01/1961

## 2016-07-23 ENCOUNTER — Encounter: Payer: Self-pay | Admitting: Physical Therapy

## 2016-07-23 ENCOUNTER — Ambulatory Visit: Payer: BLUE CROSS/BLUE SHIELD

## 2016-07-23 DIAGNOSIS — R42 Dizziness and giddiness: Secondary | ICD-10-CM | POA: Diagnosis not present

## 2016-07-23 DIAGNOSIS — M6281 Muscle weakness (generalized): Secondary | ICD-10-CM | POA: Diagnosis not present

## 2016-07-23 NOTE — Therapy (Signed)
[facility] [address], [place], [address] Phone: [phone]   Fax:  [phone]  Physical Therapy Treatment  Patient Details  Name: Amy Leonard MRN: [id number] Date of Birth: October 16, 1960 Referring Provider: Charlette Caffey  Encounter Date: 07/23/2016      PT End of Session - 07/23/16 0957    Visit Number 12   Number of Visits 13   Date for PT Re-Evaluation 08/19/16   PT Start Time 0950   PT Stop Time 1035   PT Time Calculation (min) 45 min   Activity Tolerance Patient tolerated treatment well;Patient limited by fatigue   Behavior During Therapy [facility] for tasks assessed/performed      Past Medical History:  Diagnosis Date  . Anxiety   . Anxiety and depression   . Barrett's esophageal ulceration   . 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 Breast cancer ([place]) 2014   BILATERAL LUMPECTOMY  . Cancer of breast ([place]) 01/31/2015  . Carpal tunnel syndrome   . Chronic back pain    spinal stenosis  . Depression   . Elevated WBC count    nl diff  . Erosive gastritis   . Folliculitis 12/26/8411  . GERD (gastroesophageal reflux disease)   . Glaucoma    ?  Marland Kitchen History of chemotherapy 2014   BREAST CA  . HLD (hyperlipidemia)   . HSV infection    recurrent (side and buttox)  . Hypertension   . Migraine   . Radiation 2014   BREAST CA  . Tobacco abuse   . Wears dentures    top    Past Surgical History:  Procedure Laterality Date  . BTL    . COLONOSCOPY    . epidural steroid injection  2008  . PARTIAL MASTECTOMY WITH NEEDLE LOCALIZATION AND AXILLARY SENTINEL LYMPH NODE BX Bilateral 11/23/2012   Procedure: PARTIAL MASTECTOMY WITH NEEDLE LOCALIZATION AND AXILLARY SENTINEL LYMPH NODE BX;  Surgeon: Adin Hector, MD;  Location: [facility];  Service: General;  Laterality: Bilateral;  . PORT-A-CATH REMOVAL Right 08/25/2013   Procedure: REMOVAL PORT-A-CATH;  Surgeon: Adin Hector, MD;   Location: [facility];  Service: General;  Laterality: Right;  . PORTACATH PLACEMENT Right 11/23/2012   Procedure: INSERTION PORT-A-CATH;  Surgeon: Adin Hector, MD;  Location: [facility];  Service: General;  Laterality: Right;  . UPPER GI ENDOSCOPY      There were no vitals filed for this visit.      Subjective Assessment - 07/23/16 0955    Subjective Pt has returned to work since her last therapy session. She states that the first two days were very difficult with increased pain. However the following days she reports that the pain improved but she noticed that she was significantly fatigued at the end of the day.    Pertinent History Over 10 yrs of back pain prior to surgery.   Diagnostic tests multiple images prior to and after surgery   Patient Stated Goals learn how to get stronger.   Currently in Pain? No/denies        TREATMENT  Objective:  THER-EX NuStep L1 x 2 minutes, L2 x 3 minutes during warm-up and history (2 minutes unbilled); Heel raises performed B with UE support on stairs to achieve full ROM, 3x15. Pt reported feeling the bilateral calves working. Farmers carry in clinic carrying 5# wt in each hand (10# total), pt provided cues for abdominal stabilization during  ambulation, 3 x 200' to mimic work activity with 1 minute rest breaks between sets; Stair ascend/descend 4 steps up/down x 3, repeated 3 times, 6" steps while carrying 5# in each hand (10# total); Rotary hip machine abduction, hip extension, cuing to avoid compensatory trunk lean 2x10 of each bilaterally.  HEP reinforced and education about importance;                        PT Education - 07/23/16 0956    Education provided Yes   Education Details HEP reinforced, education regarding form/technique during session   Person(s) Educated Patient   Methods Explanation   Comprehension Verbalized understanding             PT Long Term Goals -  07/01/16 0735      PT LONG TERM GOAL #1   Title Pt will be I with HEP to improve ankle strength from 3/5 to 5/5   Baseline 4/5   Time 6   Period Weeks   Status Not Met     PT LONG TERM GOAL #2   Title Pt will be able to perform sit<>stand with no UE support to decr. risk for falls   Baseline definite UE support and "plopping"   Time 6   Period Weeks   Status Not Met     PT LONG TERM GOAL #3   Title Pt will be able to lift 10 # wt from the ground with proper mechancs   Baseline unable to touch the ground    Time 6   Period Weeks   Status Not Met               Plan - 07/23/16 0957    Clinical Impression Statement Pt demonstrates significant improvement in strength, endurance, and activity tolerance compared to last time patient was seen by this therapist. She is able to complete all exercises with no increase in pain. Pt is minimally fatigued between sets but does not limit patient from participating with exercises. Pt demonstrates instability during stair descent but improves with repeated performance during session. Pt encouraged to continue HEP and follow-up as scheduled.    Rehab Potential Fair   Clinical Impairments Affecting Rehab Potential motivation, PLOF/chronic pain, sedentary lifestyle   PT Frequency 2x / week   PT Duration 6 weeks   PT Treatment/Interventions Aquatic Therapy;Therapeutic activities;Therapeutic exercise;Manual techniques;Functional mobility training;Neuromuscular re-education;ADLs/Self Care Home Management;Gait training;Patient/family education   PT Next Visit Plan Continue with strength and balance exercises.    PT Home Exercise Plan Continue as prescribed   Consulted and Agree with Plan of Care Patient      Patient will benefit from skilled therapeutic intervention in order to improve the following deficits and impairments:  Abnormal gait, Decreased balance, Difficulty walking, Pain, Decreased strength, Decreased range of motion  Visit  Diagnosis: Muscle weakness (generalized)     Problem List Patient Active Problem List   Diagnosis Date Noted  . Dizzy 02/12/2016  . Lymphedema 10/23/2015  . Carpal tunnel syndrome 10/23/2015  . Adverse effect of drug/medicinal 09/22/2015  . Encounter for routine gynecological examination 02/10/2015  . Routine general medical examination at a health care facility 02/07/2015  . Degenerative disc disease, lumbar 02/04/2014  . Anterolisthesis 02/04/2014  . Low back pain 02/02/2014  . Abnormal urinalysis 02/02/2014  . Postmenopausal estrogen deficiency 09/02/2013  . Febrile neutropenia ([place]) 01/06/2013  . Breast cancer ([place]) 01/06/2013  . Anemia of chronic disease 01/06/2013  . Thrombocytopenia,  unspecified 01/06/2013  . Lacunar infarction ([place]) 04/17/2012  . 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 Pure hypercholesterolemia 04/05/2008  . Essential hypertension 03/30/2008  . ANXIETY DEPRESSION 11/17/2007  . TOBACCO USE 11/17/2007  . BACK PAIN, CHRONIC 09/23/2007   Phillips Grout PT, DPT   Huprich,Jason 07/23/2016, 10:48 AM  [facility] [address] [place], [address] Phone: [phone]   Fax:  [phone]  Name: ABBI MANCINI MRN: [id number] Date of Birth: 07-03-1961

## 2016-07-25 ENCOUNTER — Ambulatory Visit: Payer: BLUE CROSS/BLUE SHIELD | Admitting: Physical Therapy

## 2016-07-25 DIAGNOSIS — M6281 Muscle weakness (generalized): Secondary | ICD-10-CM | POA: Diagnosis not present

## 2016-07-25 DIAGNOSIS — R42 Dizziness and giddiness: Secondary | ICD-10-CM | POA: Diagnosis not present

## 2016-07-25 NOTE — Therapy (Signed)
[facility] [address] [place], [address] Phone: [phone]   Fax:  [phone]  Physical Therapy Treatment  Patient Details  Name: Amy Leonard MRN: [id number] Date of Birth: [birth year] Referring Provider: Charlette Caffey  Encounter Date: 07/25/2016      PT End of Session - 07/25/16 1025    Visit Number 13   Number of Visits 13   Date for PT Re-Evaluation 08/19/16   PT Start Time [id number]   PT Stop Time 1055   PT Time Calculation (min) 40 min   Activity Tolerance Patient tolerated treatment well;Patient limited by fatigue   Behavior During Therapy [facility] for tasks assessed/performed      Past Medical History:  Diagnosis Date  . Anxiety   . Anxiety and depression   . Barrett's esophageal ulceration   . 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 Breast cancer ([place]) 2014   BILATERAL LUMPECTOMY  . Cancer of breast ([place]) 01/31/2015  . Carpal tunnel syndrome   . Chronic back pain    spinal stenosis  . Depression   . Elevated WBC count    nl diff  . Erosive gastritis   . Folliculitis [id number]  . GERD (gastroesophageal reflux disease)   . Glaucoma    ?  Marland Kitchen History of chemotherapy 2014   BREAST CA  . HLD (hyperlipidemia)   . HSV infection    recurrent (side and buttox)  . Hypertension   . Migraine   . Radiation 2014   BREAST CA  . Tobacco abuse   . Wears dentures    top    Past Surgical History:  Procedure Laterality Date  . BTL    . COLONOSCOPY    . epidural steroid injection  2008  . PARTIAL MASTECTOMY WITH NEEDLE LOCALIZATION AND AXILLARY SENTINEL LYMPH NODE BX Bilateral 11/23/2012   Procedure: PARTIAL MASTECTOMY WITH NEEDLE LOCALIZATION AND AXILLARY SENTINEL LYMPH NODE BX;  Surgeon: Adin Hector, MD;  Location: [facility];  Service: General;  Laterality: Bilateral;  . PORT-A-CATH REMOVAL Right 08/25/2013   Procedure: REMOVAL PORT-A-CATH;  Surgeon: Adin Hector, MD;   Location: [facility];  Service: General;  Laterality: Right;  . PORTACATH PLACEMENT Right 11/23/2012   Procedure: INSERTION PORT-A-CATH;  Surgeon: Adin Hector, MD;  Location: [facility];  Service: General;  Laterality: Right;  . UPPER GI ENDOSCOPY      There were no vitals filed for this visit.      Subjective Assessment - 07/25/16 1023    Subjective Pt reports she is having some improvement with work since starting. Reports no pain today. Some stiffness in back.   Pertinent History Over 10 yrs of back pain prior to surgery.   Diagnostic tests multiple images prior to and after surgery   Patient Stated Goals learn how to get stronger.           Objective: amb in clinic Stairs 10x4 Hip hinges Weighted carries                           PT Long Term Goals - 07/25/16 1159      PT LONG TERM GOAL #1   Title Pt will be I with HEP to improve ankle strength from 3/5 to 5/5   Baseline 4/5   Time 6   Period Weeks   Status Achieved     PT LONG TERM  GOAL #2   Title Pt will be able to perform sit<>stand with no UE support to decr. risk for falls   Baseline definite UE support and "plopping"   Time 6   Period Weeks   Status Achieved     PT LONG TERM GOAL #3   Title Pt will be able to lift 10 # wt from the ground with proper mechancs   Baseline unable to touch the ground    Time 6   Period Weeks   Status Achieved               Plan - 07/25/16 1036    Rehab Potential (P)  Fair   Clinical Impairments Affecting Rehab Potential (P)  motivation, PLOF/chronic pain, sedentary lifestyle   PT Frequency (P)  2x / week   PT Duration (P)  6 weeks   PT Treatment/Interventions (P)  Aquatic Therapy;Therapeutic activities;Therapeutic exercise;Manual techniques;Functional mobility training;Neuromuscular re-education;ADLs/Self Care Home Management;Gait training;Patient/family education   PT Next Visit Plan (P)  Continue with  strength and balance exercises.    PT Home Exercise Plan (P)  Continue as prescribed   Consulted and Agree with Plan of Care (P)  Patient      Patient will benefit from skilled therapeutic intervention in order to improve the following deficits and impairments:  (P) Abnormal gait, Decreased balance, Difficulty walking, Pain, Decreased strength, Decreased range of motion  Visit Diagnosis: Muscle weakness (generalized)     Problem List Patient Active Problem List   Diagnosis Date Noted  . Dizzy 02/12/2016  . Lymphedema 10/23/2015  . Carpal tunnel syndrome 10/23/2015  . Adverse effect of drug/medicinal 09/22/2015  . Encounter for routine gynecological examination 02/10/2015  . Routine general medical examination at a health care facility 02/07/2015  . Degenerative disc disease, lumbar 02/04/2014  . Anterolisthesis 02/04/2014  . Low back pain 02/02/2014  . Abnormal urinalysis 02/02/2014  . Postmenopausal estrogen deficiency 09/02/2013  . Febrile neutropenia ([place]) 01/06/2013  . Breast cancer ([place]) 01/06/2013  . Anemia of chronic disease 01/06/2013  . Thrombocytopenia, unspecified 01/06/2013  . Lacunar infarction ([place]) 04/17/2012  . 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 Pure hypercholesterolemia 04/05/2008  . Essential hypertension 03/30/2008  . ANXIETY DEPRESSION 11/17/2007  . TOBACCO USE 11/17/2007  . BACK PAIN, CHRONIC 09/23/2007    Akansha Wyche 07/25/2016, 12:00 PM  [facility] [address] [place], [address] Phone: [phone]   Fax:  [phone]  Name: Amy Leonard MRN: [id number] Date of Birth: 02/08/1961

## 2016-07-29 ENCOUNTER — Ambulatory Visit
Admission: RE | Admit: 2016-07-29 | Discharge: 2016-07-29 | Disposition: A | Discharge status: Home / Self Care | Payer: BLUE CROSS/BLUE SHIELD | Source: Ambulatory Visit | Attending: Radiation Oncology | Admitting: Radiation Oncology

## 2016-07-29 ENCOUNTER — Encounter: Payer: Self-pay | Admitting: Radiation Oncology

## 2016-07-29 ENCOUNTER — Inpatient Hospital Stay (HOSPITAL_BASED_OUTPATIENT_CLINIC_OR_DEPARTMENT_OTHER): Payer: BLUE CROSS/BLUE SHIELD | Admitting: Internal Medicine

## 2016-07-29 ENCOUNTER — Encounter (INDEPENDENT_AMBULATORY_CARE_PROVIDER_SITE_OTHER): Payer: Self-pay

## 2016-07-29 ENCOUNTER — Inpatient Hospital Stay: Payer: BLUE CROSS/BLUE SHIELD | Attending: Internal Medicine

## 2016-07-29 VITALS — BP 150/88 | HR 79 | Temp 97.7°F | Resp 20 | Wt 181.4 lb

## 2016-07-29 DIAGNOSIS — Z170 Estrogen receptor positive status [ER+]: Secondary | ICD-10-CM | POA: Diagnosis not present

## 2016-07-29 DIAGNOSIS — Z79811 Long term (current) use of aromatase inhibitors: Secondary | ICD-10-CM | POA: Insufficient documentation

## 2016-07-29 DIAGNOSIS — E785 Hyperlipidemia, unspecified: Secondary | ICD-10-CM | POA: Insufficient documentation

## 2016-07-29 DIAGNOSIS — K219 Gastro-esophageal reflux disease without esophagitis: Secondary | ICD-10-CM

## 2016-07-29 DIAGNOSIS — C50411 Malignant neoplasm of upper-outer quadrant of right female breast: Secondary | ICD-10-CM | POA: Diagnosis not present

## 2016-07-29 DIAGNOSIS — Z9221 Personal history of antineoplastic chemotherapy: Secondary | ICD-10-CM | POA: Insufficient documentation

## 2016-07-29 DIAGNOSIS — F17210 Nicotine dependence, cigarettes, uncomplicated: Secondary | ICD-10-CM | POA: Insufficient documentation

## 2016-07-29 DIAGNOSIS — C50912 Malignant neoplasm of unspecified site of left female breast: Secondary | ICD-10-CM | POA: Insufficient documentation

## 2016-07-29 DIAGNOSIS — Z7982 Long term (current) use of aspirin: Secondary | ICD-10-CM | POA: Diagnosis not present

## 2016-07-29 DIAGNOSIS — I10 Essential (primary) hypertension: Secondary | ICD-10-CM | POA: Insufficient documentation

## 2016-07-29 DIAGNOSIS — M549 Dorsalgia, unspecified: Secondary | ICD-10-CM | POA: Insufficient documentation

## 2016-07-29 DIAGNOSIS — F418 Other specified anxiety disorders: Secondary | ICD-10-CM

## 2016-07-29 DIAGNOSIS — G8929 Other chronic pain: Secondary | ICD-10-CM

## 2016-07-29 DIAGNOSIS — C50812 Malignant neoplasm of overlapping sites of left female breast: Secondary | ICD-10-CM

## 2016-07-29 DIAGNOSIS — C50911 Malignant neoplasm of unspecified site of right female breast: Secondary | ICD-10-CM

## 2016-07-29 DIAGNOSIS — Z79899 Other long term (current) drug therapy: Secondary | ICD-10-CM

## 2016-07-29 DIAGNOSIS — Z853 Personal history of malignant neoplasm of breast: Secondary | ICD-10-CM | POA: Diagnosis not present

## 2016-07-29 DIAGNOSIS — K2270 Barrett's esophagus without dysplasia: Secondary | ICD-10-CM

## 2016-07-29 DIAGNOSIS — Z923 Personal history of irradiation: Secondary | ICD-10-CM | POA: Insufficient documentation

## 2016-07-29 DIAGNOSIS — C50819 Malignant neoplasm of overlapping sites of unspecified female breast: Secondary | ICD-10-CM

## 2016-07-29 DIAGNOSIS — M4800 Spinal stenosis, site unspecified: Secondary | ICD-10-CM | POA: Diagnosis not present

## 2016-07-29 LAB — COMPREHENSIVE METABOLIC PANEL
ALBUMIN: 3.7 g/dL (ref 3.5–5.0)
ALK PHOS: 65 U/L (ref 38–126)
ALT: 17 U/L (ref 14–54)
AST: 20 U/L (ref 15–41)
Anion gap: 9 (ref 5–15)
BILIRUBIN TOTAL: 0.7 mg/dL (ref 0.3–1.2)
BUN: 12 mg/dL (ref 6–20)
CALCIUM: 8.8 mg/dL — AB (ref 8.9–10.3)
CO2: 25 mmol/L (ref 22–32)
CREATININE: 0.6 mg/dL (ref 0.44–1.00)
Chloride: 103 mmol/L (ref 101–111)
GFR calc Af Amer: >60 mL/min (ref >60)
GLUCOSE: 102 mg/dL — AB (ref 65–99)
Potassium: 4.1 mmol/L (ref 3.5–5.1)
Sodium: 137 mmol/L (ref 135–145)
TOTAL PROTEIN: 6.2 g/dL — AB (ref 6.5–8.1)

## 2016-07-29 LAB — CBC WITH DIFFERENTIAL/PLATELET
BASOS ABS: 0 10*3/uL (ref 0–0.1)
BASOS PCT: 1 %
Eosinophils Absolute: 0.1 10*3/uL (ref 0–0.7)
Eosinophils Relative: 1 %
HEMATOCRIT: 37.9 % (ref 35.0–47.0)
HEMOGLOBIN: 13.1 g/dL (ref 12.0–16.0)
LYMPHS PCT: 32 %
Lymphs Abs: 2.5 10*3/uL (ref 1.0–3.6)
MCH: 32.7 pg (ref 26.0–34.0)
MCHC: 34.6 g/dL (ref 32.0–36.0)
MCV: 94.5 fL (ref 80.0–100.0)
MONO ABS: 0.7 10*3/uL (ref 0.2–0.9)
MONOS PCT: 9 %
NEUTROS ABS: 4.4 10*3/uL (ref 1.4–6.5)
NEUTROS PCT: 57 %
Platelets: 236 10*3/uL (ref 150–440)
RBC: 4.01 MIL/uL (ref 3.80–5.20)
RDW: 14 % (ref 11.5–14.5)
WBC: 7.7 10*3/uL (ref 3.6–11.0)

## 2016-07-29 NOTE — Progress Notes (Signed)
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  Patient denies any new lumps or bumps. Denies any headaches. Denies any unusual cough or chest pain or weight loss.   REVIEW OF SYSTEMS:  A complete 10 point review of system is done which is negative except mentioned above/history of present illness.   PAST MEDICAL HISTORY :  Past Medical History:  Diagnosis Date  . Anxiety   . Anxiety and depression   . Barrett's esophageal ulceration   . 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 Breast cancer ([place]) 2014   BILATERAL LUMPECTOMY  . Cancer of breast ([place]) 01/31/2015  . Carpal tunnel syndrome   . Chronic back pain    spinal stenosis  . Depression   . Elevated WBC count    nl diff  . Erosive gastritis   . Folliculitis 11/24/9377  . GERD (gastroesophageal reflux disease)   . Glaucoma    ?  Marland Kitchen History of chemotherapy 2014   BREAST CA  . HLD (hyperlipidemia)   . HSV infection    recurrent (side and buttox)  . Hypertension   . Migraine   . Radiation 2014   BREAST CA  . Tobacco abuse   . Wears dentures    top    PAST SURGICAL HISTORY :   Past  Surgical History:  Procedure Laterality Date  . BTL    . COLONOSCOPY    . epidural steroid injection  2008  . PARTIAL MASTECTOMY WITH NEEDLE LOCALIZATION AND AXILLARY SENTINEL LYMPH NODE BX Bilateral 11/23/2012   Procedure: PARTIAL MASTECTOMY WITH NEEDLE LOCALIZATION AND AXILLARY SENTINEL LYMPH NODE BX;  Surgeon: Adin Hector, MD;  Location: [facility];  Service: General;  Laterality: Bilateral;  . PORT-A-CATH REMOVAL Right 08/25/2013   Procedure: REMOVAL PORT-A-CATH;  Surgeon: Adin Hector, MD;  Location: [facility];  Service: General;  Laterality: Right;  . PORTACATH PLACEMENT Right 11/23/2012   Procedure: INSERTION PORT-A-CATH;  Surgeon: Adin Hector, MD;  Location: [facility];  Service: General;  Laterality: Right;  . UPPER GI ENDOSCOPY      FAMILY HISTORY :   Family History  Problem Relation Age of Onset  . Stroke Maternal Grandmother   . Diabetes Maternal Grandmother   . Diabetes Mother   . Hypertension Mother   . Leukemia Mother   . Obesity Mother   . Depression Sister   . Bipolar disorder Son     Bipolar / oppositional defiant  . Alcohol abuse Sister   .  Drug abuse Sister   . Alcohol abuse Sister   . Drug abuse Sister   . Alcohol abuse Brother   . Drug abuse Brother   . Colon cancer Neg Hx   . Breast cancer Neg Hx     SOCIAL HISTORY:   Social History  Substance Use Topics  . Smoking status: Light Tobacco Smoker    Packs/day: 0.50    Years: 33.00    Types: Cigarettes  . Smokeless tobacco: Never Used     Comment: no smoking since 02/27/16  . Alcohol use 0.0 oz/week     Comment: beer rarely    ALLERGIES:  is allergic to lyrica [pregabalin]; bupropion hcl; chlorhexidine gluconate; hydrocod polst-cpm polst er; lisinopril; and naproxen sodium.  MEDICATIONS:  Current Outpatient Prescriptions  Medication Sig Dispense Refill  . Acetaminophen (TYLENOL ARTHRITIS PAIN PO) Take 2 tablets by mouth as needed (pain).      Marland Kitchen acyclovir ointment (ZOVIRAX) 5 % Apply 1 application topically as needed (flare-up). 15 g 1  . anastrozole (ARIMIDEX) 1 MG tablet Take 1 tablet (1 mg total) by mouth daily. 90 tablet 0  . aspirin 81 MG chewable tablet Chew 1 tablet (81 mg total) by mouth daily. 90 tablet 3  . atorvastatin (LIPITOR) 10 MG tablet TAKE 1 TABLET BY MOUTH EVERY DAY 90 tablet 1  . b complex vitamins tablet Take 1 tablet by mouth daily.    . calcium carbonate (TUMS - DOSED IN MG ELEMENTAL CALCIUM) 500 MG chewable tablet Chew by mouth as directed.      . Calcium Carbonate-Vitamin D (CALCIUM + D PO) Take 1 tablet by mouth daily.    Marland Kitchen omeprazole (PRILOSEC) 40 MG capsule Take 1 capsule (40 mg total) by mouth daily. 90 capsule 0  . ranitidine (ZANTAC) 300 MG tablet Take 150 mg by mouth at bedtime.     . simethicone (MYLICON) 80 MG chewable tablet Chew 1 tablet (80 mg total) by mouth every 8 (eight) hours as needed for flatulence. 60 tablet 3  . traMADol (ULTRAM) 50 MG tablet Take 0.5-1 tablets (25-50 mg total) by mouth every 8 (eight) hours as needed for moderate pain or severe pain (caution of sedation). 30 tablet 1   No current facility-administered medications for this visit.     PHYSICAL EXAMINATION: ECOG PERFORMANCE STATUS: 0 - Asymptomatic  BP (!) 143/87 (BP Location: Left Arm, Patient Position: Sitting)   Pulse 82   Temp 97.7 F (36.5 C) (Tympanic)   Ht '5\' 3"'  (1.6 m)   Wt 181 lb 6.4 oz (82.3 kg)   BMI 32.13 kg/m   Filed Weights   07/29/16 1030  Weight: 181 lb 6.4 oz (82.3 kg)    GENERAL: Well-nourished well-developed; Alert, no distress and comfortable.   Alone EYES: no pallor or icterus OROPHARYNX: no thrush or ulceration; good dentition  NECK: supple, no masses felt LYMPH:  no palpable lymphadenopathy in the cervical, axillary or inguinal regions LUNGS: clear to auscultation and  No wheeze or crackles HEART/CVS: regular rate & rhythm and no murmurs; No lower extremity edema ABDOMEN:abdomen  soft, non-tender and normal bowel sounds Musculoskeletal:no cyanosis of digits and no clubbing  PSYCH: alert & oriented x 3 with fluent speech NEURO: no focal motor/sensory deficits SKIN:  no rashes or significant lesions Right and left BREAST exam [in the presence of nurse]- no unusual skin changes or dominant masses felt. Surgical scars noted.   LABORATORY DATA:  I have reviewed the data as listed  Component Value Date/Time   NA 137 07/29/2016 0914   NA 140 07/25/2014 1100   NA 140 11/02/2013 1040   K 4.1 07/29/2016 0914   K 4.1 07/25/2014 1100   K 3.9 11/02/2013 1040   CL 103 07/29/2016 0914   CL 105 07/25/2014 1100   CL 109 (H) 02/17/2013 1120   CO2 25 07/29/2016 0914   CO2 30 07/25/2014 1100   CO2 24 11/02/2013 1040   GLUCOSE 102 (H) 07/29/2016 0914   GLUCOSE 97 07/25/2014 1100   GLUCOSE 103 11/02/2013 1040   GLUCOSE 99 02/17/2013 1120   BUN 12 07/29/2016 0914   BUN 15 07/25/2014 1100   BUN 9.3 11/02/2013 1040   CREATININE 0.60 07/29/2016 0914   CREATININE 0.74 07/25/2014 1100   CREATININE 0.7 11/02/2013 1040   CALCIUM 8.8 (L) 07/29/2016 0914   CALCIUM 8.6 07/25/2014 1100   CALCIUM 9.5 11/02/2013 1040   PROT 6.2 (L) 07/29/2016 0914   PROT 6.4 07/25/2014 1100   PROT 6.2 (L) 11/02/2013 1040   ALBUMIN 3.7 07/29/2016 0914   ALBUMIN 3.5 07/25/2014 1100   ALBUMIN 3.7 11/02/2013 1040   AST 20 07/29/2016 0914   AST 16 07/25/2014 1100   AST 20 11/02/2013 1040   ALT 17 07/29/2016 0914   ALT 24 07/25/2014 1100   ALT 22 11/02/2013 1040   ALKPHOS 65 07/29/2016 0914   ALKPHOS 75 07/25/2014 1100   ALKPHOS 84 11/02/2013 1040   BILITOT 0.7 07/29/2016 0914   BILITOT 0.3 07/25/2014 1100   BILITOT 0.48 11/02/2013 1040   GFRNONAA >60 07/29/2016 0914   GFRNONAA >60 07/25/2014 1100   GFRAA >60 07/29/2016 0914   GFRAA >60 07/25/2014 1100    No results found for: SPEP, UPEP  Lab Results  Component Value Date   WBC 7.7 07/29/2016   NEUTROABS 4.4 07/29/2016   HGB 13.1  07/29/2016   HCT 37.9 07/29/2016   MCV 94.5 07/29/2016   PLT 236 07/29/2016      Chemistry      Component Value Date/Time   NA 137 07/29/2016 0914   NA 140 07/25/2014 1100   NA 140 11/02/2013 1040   K 4.1 07/29/2016 0914   K 4.1 07/25/2014 1100   K 3.9 11/02/2013 1040   CL 103 07/29/2016 0914   CL 105 07/25/2014 1100   CL 109 (H) 02/17/2013 1120   CO2 25 07/29/2016 0914   CO2 30 07/25/2014 1100   CO2 24 11/02/2013 1040   BUN 12 07/29/2016 0914   BUN 15 07/25/2014 1100   BUN 9.3 11/02/2013 1040   CREATININE 0.60 07/29/2016 0914   CREATININE 0.74 07/25/2014 1100   CREATININE 0.7 11/02/2013 1040      Component Value Date/Time   CALCIUM 8.8 (L) 07/29/2016 0914   CALCIUM 8.6 07/25/2014 1100   CALCIUM 9.5 11/02/2013 1040   ALKPHOS 65 07/29/2016 0914   ALKPHOS 75 07/25/2014 1100   ALKPHOS 84 11/02/2013 1040   AST 20 07/29/2016 0914   AST 16 07/25/2014 1100   AST 20 11/02/2013 1040   ALT 17 07/29/2016 0914   ALT 24 07/25/2014 1100   ALT 22 11/02/2013 1040   BILITOT 0.7 07/29/2016 0914   BILITOT 0.3 07/25/2014 1100   BILITOT 0.48 11/02/2013 1040       RADIOGRAPHIC STUDIES: I have personally reviewed the radiological images as listed and agreed with the findings in the report. No results found.   ASSESSMENT & PLAN:  Carcinoma of overlapping sites of left breast  in female, estrogen receptor positive ([place]) Bilateral synchronous breast cancer-early stage. Patient currently on adjuvant Arimidex. No concerns for any recurrence based on physical exam/symptoms.  # BMD- N June 2016- Ca+ vit.   # follow up with MD/ mammo/DEXA scan/ no labs.    Orders Placed This Encounter  Procedures  . DG Bone Density    Standing Status:   Future    Standing Expiration Date:   07/29/2017    Order Specific Question:   Reason for Exam (SYMPTOM  OR DIAGNOSIS REQUIRED)    Answer:   Carcinoma of overlapping sites of left breast in female    Order Specific Question:   Preferred imaging  location?    Answer:   [facility]    Order Specific Question:   Is the patient pregnant?    Answer:   No  . MM Digital Diagnostic Bilat    Standing Status:   Future    Standing Expiration Date:   07/29/2017    Order Specific Question:   Reason for Exam (SYMPTOM  OR DIAGNOSIS REQUIRED)    Answer:   Carcinoma of overlapping sites of left breast in female    Order Specific Question:   Is the patient pregnant?    Answer:   No    Order Specific Question:   Preferred imaging location?    Answer:   [facility]  . US Breast Complete [place] [facility]    Standing Status:   Future    Standing Expiration Date:   07/29/2017    Order Specific Question:   Reason for Exam (SYMPTOM  OR DIAGNOSIS REQUIRED)    Answer:   Carcinoma of overlapping sites of left breast in female    Order Specific Question:   Preferred imaging location?    Answer:   [facility]  . US Breast Complete [employer] [facility]    Standing Status:   Future    Standing Expiration Date:   07/29/2017    Order Specific Question:   Reason for Exam (SYMPTOM  OR DIAGNOSIS REQUIRED)    Answer:   Carcinoma of overlapping sites of left breast in female    Order Specific Question:   Preferred imaging location?    Answer:   [facility]   All questions were answered. The patient knows to call the clinic with any problems, questions or concerns.      Cammie Sickle, MD 07/30/2016 8:13 AM

## 2016-07-29 NOTE — Progress Notes (Signed)
Radiation Oncology Follow up Note  Name: Amy Leonard   Date:   07/29/2016 MRN:  [id number] DOB: Feb 23, 1961    This 55 y.o. female presents to the clinic today for a 3 year follow-up status post bilateral radiation.  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 ER/PR positive invasive mammary carcinoma status post adjuvant chemotherapy and again radiation therapy. She is seen today in routine follow-up is doing well. She specifically denies breast tenderness cough or bone pain. She is currently on Arimidex tolerating that well without side effect. Last mammograms were in April and benign with 1 year follow-up recommended  COMPLICATIONS OF TREATMENT: none  FOLLOW UP COMPLIANCE: keeps appointments   PHYSICAL EXAM:  BP (!) 150/88   Pulse 79   Temp 97.7 F (36.5 C)   Resp 20   Wt 181 lb 7 oz (82.3 kg)   BMI 32.40 kg/m  Lungs are clear to A&P cardiac examination essentially unremarkable with regular rate and rhythm. No dominant mass or nodularity is noted in either breast in 2 positions examined. Incision is well-healed. No axillary or supraclavicular adenopathy is appreciated. Cosmetic result is excellent. Well-developed well-nourished patient in NAD. HEENT reveals PERLA, EOMI, discs not visualized.  Oral cavity is clear. No oral mucosal lesions are identified. Neck is clear without evidence of cervical or supraclavicular adenopathy. Lungs are clear to A&P. Cardiac examination is essentially unremarkable with regular rate and rhythm without murmur rub or thrill. Abdomen is benign with no organomegaly or masses noted. Motor sensory and DTR levels are equal and symmetric in the upper and lower extremities. Cranial nerves II through XII are grossly intact. Proprioception is intact. No peripheral adenopathy or edema is  identified. No motor or sensory levels are noted. Crude visual fields are within normal range.  RADIOLOGY RESULTS: Mammograms are reviewed and compatible with the above-stated findings  PLAN: Present time patient is doing well with no evidence of disease. I'll see her back in 1 year for follow-up and then discontinue follow-up care. She continues on aromatase without side effect. She or he has follow-up mammogram scheduled for April 2018. Patient knows to call with any concerns.  I would like to take this opportunity to thank you for allowing me to participate in the care of your patient.Armstead Peaks., MD

## 2016-07-29 NOTE — Assessment & Plan Note (Addendum)
Bilateral synchronous breast cancer-early stage. Patient currently on adjuvant Arimidex. No concerns for any recurrence based on physical exam/symptoms.  # BMD- N June 2016- Ca+ vit.   # follow up with MD/ mammo/DEXA scan/ no labs.

## 2016-07-29 NOTE — Progress Notes (Signed)
Patient here for follow up. No changes since last appointment.  

## 2016-08-13 DIAGNOSIS — M4316 Spondylolisthesis, lumbar region: Secondary | ICD-10-CM | POA: Diagnosis not present

## 2016-09-16 ENCOUNTER — Telehealth: Payer: Self-pay | Admitting: *Deleted

## 2016-09-16 MED ORDER — ANASTROZOLE 1 MG PO TABS: 1.0000 mg | ORAL_TABLET | Freq: Every day | ORAL | 0 refills | Status: DC

## 2016-09-16 NOTE — Telephone Encounter (Signed)
Refill completed.

## 2016-10-19 ENCOUNTER — Other Ambulatory Visit: Payer: Self-pay | Admitting: Family Medicine

## 2016-10-21 NOTE — Telephone Encounter (Signed)
Pt hasn't had a recent lipid lab but it looks like she is going through cancer treatments, please advise

## 2016-10-21 NOTE — Telephone Encounter (Signed)
Please schedule a summer annual exam with labs prior and refill until then

## 2016-10-22 NOTE — Telephone Encounter (Signed)
appt scheduled and med refilled 

## 2016-12-14 ENCOUNTER — Other Ambulatory Visit: Payer: Self-pay | Admitting: Internal Medicine

## 2016-12-23 ENCOUNTER — Ambulatory Visit
Admission: RE | Admit: 2016-12-23 | Discharge: 2016-12-23 | Disposition: A | Discharge status: Home / Self Care | Payer: BLUE CROSS/BLUE SHIELD | Source: Ambulatory Visit | Attending: Internal Medicine | Admitting: Internal Medicine

## 2016-12-23 DIAGNOSIS — C50812 Malignant neoplasm of overlapping sites of left female breast: Secondary | ICD-10-CM

## 2016-12-23 DIAGNOSIS — Z170 Estrogen receptor positive status [ER+]: Secondary | ICD-10-CM

## 2016-12-23 DIAGNOSIS — Z853 Personal history of malignant neoplasm of breast: Secondary | ICD-10-CM | POA: Diagnosis not present

## 2016-12-23 DIAGNOSIS — M85851 Other specified disorders of bone density and structure, right thigh: Secondary | ICD-10-CM | POA: Diagnosis not present

## 2016-12-23 DIAGNOSIS — M8580 Other specified disorders of bone density and structure, unspecified site: Secondary | ICD-10-CM | POA: Diagnosis not present

## 2016-12-23 DIAGNOSIS — Z13820 Encounter for screening for osteoporosis: Secondary | ICD-10-CM | POA: Diagnosis not present

## 2016-12-23 DIAGNOSIS — Z780 Asymptomatic menopausal state: Secondary | ICD-10-CM | POA: Diagnosis not present

## 2016-12-23 DIAGNOSIS — Z923 Personal history of irradiation: Secondary | ICD-10-CM | POA: Diagnosis not present

## 2016-12-23 DIAGNOSIS — Z9221 Personal history of antineoplastic chemotherapy: Secondary | ICD-10-CM | POA: Insufficient documentation

## 2016-12-23 HISTORY — DX: Personal history of irradiation: Z92.3

## 2016-12-23 HISTORY — DX: Personal history of antineoplastic chemotherapy: Z92.21

## 2017-01-13 ENCOUNTER — Telehealth: Payer: Self-pay | Admitting: Family Medicine

## 2017-01-13 NOTE — Telephone Encounter (Signed)
Pt sent in request on mychart. See below:   Preferred Date Range: Any date 01/10/2017 or later  Preferred Times: Any  Reason: To address the following health maintenance concerns. Hepatitis C Screening  Comments: need to make an appt. for hep C screening

## 2017-01-13 NOTE — Telephone Encounter (Signed)
Please make a lab appt for hep C antibody for hep C screening  Thanks

## 2017-01-19 ENCOUNTER — Telehealth: Payer: Self-pay | Admitting: Family Medicine

## 2017-01-19 DIAGNOSIS — Z0000 Encounter for general adult medical examination without abnormal findings: Secondary | ICD-10-CM

## 2017-01-19 NOTE — Telephone Encounter (Signed)
-----   Message from Marchia Bond sent at 01/15/2017  1:20 PM EDT ----- Regarding: Cpx labs Fri 6/1, need orders. Thanks! :-) Please order  future cpx labs for pt's upcoming lab appt. Thanks Aniceto Boss

## 2017-01-24 ENCOUNTER — Other Ambulatory Visit (INDEPENDENT_AMBULATORY_CARE_PROVIDER_SITE_OTHER): Payer: BLUE CROSS/BLUE SHIELD

## 2017-01-24 DIAGNOSIS — Z1159 Encounter for screening for other viral diseases: Secondary | ICD-10-CM | POA: Diagnosis not present

## 2017-01-24 DIAGNOSIS — Z0000 Encounter for general adult medical examination without abnormal findings: Secondary | ICD-10-CM | POA: Diagnosis not present

## 2017-01-24 LAB — LIPID PANEL
CHOLESTEROL: 128 mg/dL (ref 0–200)
HDL: 48.6 mg/dL (ref >39.00)
LDL CALC: 65 mg/dL (ref 0–99)
NonHDL: 78.9
TRIGLYCERIDES: 70 mg/dL (ref 0.0–149.0)
Total CHOL/HDL Ratio: 3
VLDL: 14 mg/dL (ref 0.0–40.0)

## 2017-01-24 LAB — CBC WITH DIFFERENTIAL/PLATELET
BASOS PCT: 0.4 % (ref 0.0–3.0)
Basophils Absolute: 0 10*3/uL (ref 0.0–0.1)
EOS PCT: 1.2 % (ref 0.0–5.0)
Eosinophils Absolute: 0.1 10*3/uL (ref 0.0–0.7)
HEMATOCRIT: 40.9 % (ref 36.0–46.0)
Hemoglobin: 13.8 g/dL (ref 12.0–15.0)
Lymphocytes Relative: 29.1 % (ref 12.0–46.0)
Lymphs Abs: 2.9 10*3/uL (ref 0.7–4.0)
MCHC: 33.7 g/dL (ref 30.0–36.0)
MCV: 97.4 fl (ref 78.0–100.0)
MONO ABS: 0.8 10*3/uL (ref 0.1–1.0)
Monocytes Relative: 8.5 % (ref 3.0–12.0)
NEUTROS ABS: 6 10*3/uL (ref 1.4–7.7)
Neutrophils Relative %: 60.8 % (ref 43.0–77.0)
PLATELETS: 243 10*3/uL (ref 150.0–400.0)
RBC: 4.2 Mil/uL (ref 3.87–5.11)
RDW: 13.4 % (ref 11.5–15.5)
WBC: 9.9 10*3/uL (ref 4.0–10.5)

## 2017-01-24 LAB — COMPREHENSIVE METABOLIC PANEL
ALBUMIN: 4 g/dL (ref 3.5–5.2)
ALK PHOS: 72 U/L (ref 39–117)
ALT: 16 U/L (ref 0–35)
AST: 13 U/L (ref 0–37)
BILIRUBIN TOTAL: 0.5 mg/dL (ref 0.2–1.2)
BUN: 14 mg/dL (ref 6–23)
CALCIUM: 9.2 mg/dL (ref 8.4–10.5)
CO2: 28 mEq/L (ref 19–32)
Chloride: 109 mEq/L (ref 96–112)
Creatinine, Ser: 0.62 mg/dL (ref 0.40–1.20)
GFR: 105.71 mL/min (ref >60.00)
Glucose, Bld: 115 mg/dL — ABNORMAL HIGH (ref 70–99)
Potassium: 4 mEq/L (ref 3.5–5.1)
Sodium: 143 mEq/L (ref 135–145)
Total Protein: 6.3 g/dL (ref 6.0–8.3)

## 2017-01-24 LAB — TSH: TSH: 2.14 u[IU]/mL (ref 0.35–4.50)

## 2017-01-25 LAB — HEPATITIS C ANTIBODY: HCV Ab: NEGATIVE

## 2017-01-27 ENCOUNTER — Ambulatory Visit: Payer: BLUE CROSS/BLUE SHIELD | Admitting: Internal Medicine

## 2017-01-28 ENCOUNTER — Encounter: Payer: Self-pay | Admitting: Family Medicine

## 2017-01-28 ENCOUNTER — Ambulatory Visit (INDEPENDENT_AMBULATORY_CARE_PROVIDER_SITE_OTHER): Payer: BLUE CROSS/BLUE SHIELD | Admitting: Family Medicine

## 2017-01-28 VITALS — BP 136/70 | HR 78 | Temp 98.4°F | Ht 62.75 in | Wt 177.8 lb

## 2017-01-28 DIAGNOSIS — C50812 Malignant neoplasm of overlapping sites of left female breast: Secondary | ICD-10-CM

## 2017-01-28 DIAGNOSIS — Z8673 Personal history of transient ischemic attack (TIA), and cerebral infarction without residual deficits: Secondary | ICD-10-CM | POA: Diagnosis not present

## 2017-01-28 DIAGNOSIS — E7800 Pure hypercholesterolemia, unspecified: Secondary | ICD-10-CM | POA: Diagnosis not present

## 2017-01-28 DIAGNOSIS — Z170 Estrogen receptor positive status [ER+]: Secondary | ICD-10-CM | POA: Diagnosis not present

## 2017-01-28 DIAGNOSIS — Z0000 Encounter for general adult medical examination without abnormal findings: Secondary | ICD-10-CM

## 2017-01-28 DIAGNOSIS — I10 Essential (primary) hypertension: Secondary | ICD-10-CM | POA: Diagnosis not present

## 2017-01-28 DIAGNOSIS — F17200 Nicotine dependence, unspecified, uncomplicated: Secondary | ICD-10-CM | POA: Diagnosis not present

## 2017-01-28 MED ORDER — ATORVASTATIN CALCIUM 10 MG PO TABS: 10.0000 mg | ORAL_TABLET | Freq: Every day | ORAL | 3 refills | Status: DC
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
## 2017-01-28 NOTE — Patient Instructions (Addendum)
Get back to your physical therapy exercises at home  Stay active   Start packing lunch/ eat more fruits and vegetables  Carnation inst breakfast are good for breakfast   Try to get 1200-1500 mg of calcium per day with at least 1000 iu of vitamin D - for bone health   If you are interested in the new shingles vaccine (Shingrix) - call your insurance to check on coverage,( you should not get it within 1 month of other vaccines) , then call us for a prescription  for it to take to a pharmacy that gives the shot , or make a nurse visit to get it here depending on your coverage  Keep thinking about quitting smoking   Your blood sugar is elevated  We need to watch you for diabetes  Avoid sweets and sweetened drinks  Exercise and work on weights   If varicose veins bother you - hose with support may help

## 2017-01-29 NOTE — Assessment & Plan Note (Signed)
Pt went back to smoking  Disc in detail risks of smoking and possible outcomes including copd, vascular/ heart disease, cancer , respiratory and sinus infections  Pt voices understanding  She is not yet ready to quit but strongly enc  She has had CVA and breast cancer

## 2017-01-29 NOTE — Assessment & Plan Note (Signed)
bp in fair control at this time  BP Readings from Last 1 Encounters:  01/28/17 136/70   No changes needed (no longer on medicine) Disc lifstyle change with low sodium diet and exercise  Labs reviewed  Wt loss enc

## 2017-01-29 NOTE — Assessment & Plan Note (Signed)
Reviewed health habits including diet and exercise and skin cancer prevention Reviewed appropriate screening tests for age  Also reviewed health mt list, fam hx and immunization status , as well as social and family history   See HPI Labs reviewed Enc wt loss and smoking cessation  Disc low glycemic diet Disc Shingrix vaccine  Continue diagnostic mammograms

## 2017-01-29 NOTE — Assessment & Plan Note (Signed)
Disc goals for lipids and reasons to control them Rev labs with pt Rev low sat fat diet in detail Well controlled with atorvastatin

## 2017-01-29 NOTE — Assessment & Plan Note (Signed)
No further symptoms or problems  81 mg asa  No longer on HTN medication  Cholesterol controlled with atrovastatin  Enc very strongly to quit smoking

## 2017-01-29 NOTE — Assessment & Plan Note (Signed)
Mm neg 4/18 and neg for BRACA gene  On arimidex for 2 more years  3 years cancer free now

## 2017-02-17 ENCOUNTER — Inpatient Hospital Stay: Payer: BLUE CROSS/BLUE SHIELD | Attending: Internal Medicine | Admitting: Internal Medicine

## 2017-02-17 VITALS — BP 106/70 | HR 88 | Temp 97.8°F | Resp 18 | Ht 62.75 in | Wt 179.5 lb

## 2017-02-17 DIAGNOSIS — Z9013 Acquired absence of bilateral breasts and nipples: Secondary | ICD-10-CM

## 2017-02-17 DIAGNOSIS — M8580 Other specified disorders of bone density and structure, unspecified site: Secondary | ICD-10-CM | POA: Insufficient documentation

## 2017-02-17 DIAGNOSIS — F329 Major depressive disorder, single episode, unspecified: Secondary | ICD-10-CM

## 2017-02-17 DIAGNOSIS — F418 Other specified anxiety disorders: Secondary | ICD-10-CM | POA: Diagnosis not present

## 2017-02-17 DIAGNOSIS — K219 Gastro-esophageal reflux disease without esophagitis: Secondary | ICD-10-CM | POA: Diagnosis not present

## 2017-02-17 DIAGNOSIS — Z79811 Long term (current) use of aromatase inhibitors: Secondary | ICD-10-CM | POA: Diagnosis not present

## 2017-02-17 DIAGNOSIS — I10 Essential (primary) hypertension: Secondary | ICD-10-CM

## 2017-02-17 DIAGNOSIS — C50812 Malignant neoplasm of overlapping sites of left female breast: Secondary | ICD-10-CM | POA: Diagnosis not present

## 2017-02-17 DIAGNOSIS — Z7982 Long term (current) use of aspirin: Secondary | ICD-10-CM

## 2017-02-17 DIAGNOSIS — F17210 Nicotine dependence, cigarettes, uncomplicated: Secondary | ICD-10-CM | POA: Diagnosis not present

## 2017-02-17 DIAGNOSIS — E785 Hyperlipidemia, unspecified: Secondary | ICD-10-CM | POA: Insufficient documentation

## 2017-02-17 DIAGNOSIS — Z8719 Personal history of other diseases of the digestive system: Secondary | ICD-10-CM | POA: Diagnosis not present

## 2017-02-17 DIAGNOSIS — Z79899 Other long term (current) drug therapy: Secondary | ICD-10-CM | POA: Diagnosis not present

## 2017-02-17 DIAGNOSIS — G8929 Other chronic pain: Secondary | ICD-10-CM | POA: Diagnosis not present

## 2017-02-17 DIAGNOSIS — K2270 Barrett's esophagus without dysplasia: Secondary | ICD-10-CM

## 2017-02-17 DIAGNOSIS — Z8673 Personal history of transient ischemic attack (TIA), and cerebral infarction without residual deficits: Secondary | ICD-10-CM

## 2017-02-17 DIAGNOSIS — Z170 Estrogen receptor positive status [ER+]: Secondary | ICD-10-CM | POA: Diagnosis not present

## 2017-02-17 NOTE — Progress Notes (Signed)
Patient here for breast cancer follow-up. She reports neuropathic pain. She is not using gabapentin or lyrica due to previous side effects. Socks irritate the bottom of her feet. She is interested in acupuncture.  RN Chaperoned provider with Breast Exam

## 2017-02-17 NOTE — Assessment & Plan Note (Addendum)
Bilateral synchronous breast cancer-early stage. Patient currently on adjuvant Arimidex [started 2015]. No concerns for any recurrence based on physical exam/symptoms. Will order breast cancer index at next visit.  # BMD- June 2018-osteopenia- Ca+ vit. discussed re: prolia/ fosomax/boniva. No decisions made today.  # PN-1-2 sec to chemo; intolerant to Neurontin/Lyrica. Discussed regardingcymbalta/ amiltryplline; pt intersted in accupuncture.    # follow up in 6 months/no labs. [thereafter in 1 year]. Will order screening mammo; diagnostic mammo- costs pt.

## 2017-02-17 NOTE — Progress Notes (Signed)
[facility] OFFICE PROGRESS NOTE  Patient Care Team: Tower, Wynelle Fanny, MD as PCP - General  Cancer of breast [facility])   Staging form: Breast, AJCC 7th Edition     Clinical: Stage IA (T1c, N0, M0) - Unsigned    Oncology History   # 2014- SYNCHRONOUS BIL BREAST CA [Dr.K. Humphrey Rolls; [place]]  # RIGHT-IMC; lumpec & ALND-T1cN0; TNBC s/p RT  # LEFT-IMC;STAGE II  T1bN1 ER/PR-Pos Her 2 Neu-NEG s/p Lumpec & RT  # s/p AC x4- Taxol/ gem-Carbo  # MARCH 2015- STARTED Arimidex   # BMD- 2018- osteopenia  # BRCA testing- NEG [as per pt]     Carcinoma of overlapping sites of left breast in female, estrogen receptor positive ([place])    INTERVAL HISTORY:  Amy Leonard 56 y.o.  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 She had a recent mammogram/DEXA scan.   Patient denies any new lumps or bumps. Denies any headaches. Denies any unusual cough or chest pain or weight loss.  Occasional hot flashes.  REVIEW OF SYSTEMS:  A complete 10 point review of system is done which is negative except mentioned above/history of present illness.   PAST MEDICAL HISTORY :  Past Medical History:  Diagnosis Date  . Anxiety   . Anxiety and depression   . Barrett's esophageal ulceration   . 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 Breast cancer ([place]) 2014   BILATERAL LUMPECTOMY  . Carpal tunnel syndrome   . Chronic back pain    spinal stenosis  . Depression   . Elevated WBC count    nl diff  . Erosive gastritis   . Folliculitis 11/30/960  . GERD (gastroesophageal reflux disease)   . Glaucoma    ?  Marland Kitchen History of chemotherapy 2014   BREAST CA  . HLD (hyperlipidemia)   . HSV infection    recurrent (side and buttox)  . Hypertension   . Migraine   . Personal history of chemotherapy 2014   bilateral breast ca  . Personal history of radiation therapy 2014   bilateral breast ca  . Radiation 2014   BREAST CA  .  Tobacco abuse   . Wears dentures    top    PAST SURGICAL HISTORY :   Past Surgical History:  Procedure Laterality Date  . BREAST LUMPECTOMY Bilateral 2014   bilateral breast ca  . BTL    . COLONOSCOPY    . epidural steroid injection  2008  . PARTIAL MASTECTOMY WITH NEEDLE LOCALIZATION AND AXILLARY SENTINEL LYMPH NODE BX Bilateral 11/23/2012   Procedure: PARTIAL MASTECTOMY WITH NEEDLE LOCALIZATION AND AXILLARY SENTINEL LYMPH NODE BX;  Surgeon: Adin Hector, MD;  Location: [place];  Service: General;  Laterality: Bilateral;  . PORT-A-CATH REMOVAL Right 08/25/2013   Procedure: REMOVAL PORT-A-CATH;  Surgeon: Adin Hector, MD;  Location: [place];  Service: General;  Laterality: Right;  . PORTACATH PLACEMENT Right 11/23/2012   Procedure: INSERTION PORT-A-CATH;  Surgeon: Adin Hector, MD;  Location: [place];  Service: General;  Laterality: Right;  . UPPER GI ENDOSCOPY      FAMILY HISTORY :   Family History  Problem Relation Age of Onset  . Stroke Maternal Grandmother   . Diabetes Maternal Grandmother   . Diabetes Mother   . Hypertension Mother   . Leukemia Mother   . Obesity Mother   . Depression Sister   .  Bipolar disorder Son        Bipolar / oppositional defiant  . Alcohol abuse Sister   . Drug abuse Sister   . Alcohol abuse Sister   . Drug abuse Sister   . Alcohol abuse Brother   . Drug abuse Brother   . Pancreatic cancer Brother   . Colon cancer Neg Hx   . Breast cancer Neg Hx     SOCIAL HISTORY:   Social History  Substance Use Topics  . Smoking status: Current Every Day Smoker    Packs/day: 0.50    Years: 33.00    Types: Cigarettes  . Smokeless tobacco: Never Used     Comment: no smoking since 02/27/16  . Alcohol use 0.0 oz/week     Comment: beer rarely    ALLERGIES:  is allergic to gabapentin; lyrica [pregabalin]; bupropion hcl; chlorhexidine gluconate; hydrocod polst-cpm polst er; lisinopril; and  naproxen sodium.  MEDICATIONS:  Current Outpatient Prescriptions  Medication Sig Dispense Refill  . anastrozole (ARIMIDEX) 1 MG tablet TAKE 1 TABLET DAILY 90 tablet 0  . aspirin 81 MG chewable tablet Chew 1 tablet (81 mg total) by mouth daily. 90 tablet 3  . atorvastatin (LIPITOR) 10 MG tablet Take 1 tablet (10 mg total) by mouth daily. 90 tablet 3  . b complex vitamins tablet Take 1 tablet by mouth daily.    . Calcium Carbonate-Vitamin D (CALCIUM + D PO) Take 1 tablet by mouth daily.    Marland Kitchen omeprazole (PRILOSEC) 40 MG capsule Take 1 capsule (40 mg total) by mouth daily. 90 capsule 0  . ranitidine (ZANTAC) 300 MG tablet Take 150 mg by mouth at bedtime.     . Acetaminophen (TYLENOL ARTHRITIS PAIN PO) Take 2 tablets by mouth as needed (pain).     . calcium carbonate (TUMS - DOSED IN MG ELEMENTAL CALCIUM) 500 MG chewable tablet Chew by mouth as directed.       No current facility-administered medications for this visit.     PHYSICAL EXAMINATION: ECOG PERFORMANCE STATUS: 0 - Asymptomatic  BP 106/70 (Patient Position: Sitting)   Pulse 88   Temp 97.8 F (36.6 C) (Tympanic)   Resp 18   Ht 5' 2.75" (1.594 m)   Wt 179 lb 8 oz (81.4 kg)   BMI 32.05 kg/m   Filed Weights   02/17/17 0953  Weight: 179 lb 8 oz (81.4 kg)    GENERAL: Well-nourished well-developed; Alert, no distress and comfortable.   Alone EYES: no pallor or icterus OROPHARYNX: no thrush or ulceration; good dentition  NECK: supple, no masses felt LYMPH:  no palpable lymphadenopathy in the cervical, axillary or inguinal regions LUNGS: clear to auscultation and  No wheeze or crackles HEART/CVS: regular rate & rhythm and no murmurs; No lower extremity edema ABDOMEN:abdomen soft, non-tender and normal bowel sounds Musculoskeletal:no cyanosis of digits and no clubbing  PSYCH: alert & oriented x 3 with fluent speech NEURO: no focal motor/sensory deficits SKIN:  no rashes or significant lesions Right and left BREAST exam [in  the presence of nurse]- no unusual skin changes or dominant masses felt. Surgical scars noted.   LABORATORY DATA:  I have reviewed the data as listed    Component Value Date/Time   NA 143 01/24/2017 0910   NA 140 07/25/2014 1100   NA 140 11/02/2013 1040   K 4.0 01/24/2017 0910   K 4.1 07/25/2014 1100   K 3.9 11/02/2013 1040   CL 109 01/24/2017 0910   CL 105 07/25/2014 1100  CL 109 (H) 02/17/2013 1120   CO2 28 01/24/2017 0910   CO2 30 07/25/2014 1100   CO2 24 11/02/2013 1040   GLUCOSE 115 (H) 01/24/2017 0910   GLUCOSE 97 07/25/2014 1100   GLUCOSE 103 11/02/2013 1040   GLUCOSE 99 02/17/2013 1120   BUN 14 01/24/2017 0910   BUN 15 07/25/2014 1100   BUN 9.3 11/02/2013 1040   CREATININE 0.62 01/24/2017 0910   CREATININE 0.74 07/25/2014 1100   CREATININE 0.7 11/02/2013 1040   CALCIUM 9.2 01/24/2017 0910   CALCIUM 8.6 07/25/2014 1100   CALCIUM 9.5 11/02/2013 1040   PROT 6.3 01/24/2017 0910   PROT 6.4 07/25/2014 1100   PROT 6.2 (L) 11/02/2013 1040   ALBUMIN 4.0 01/24/2017 0910   ALBUMIN 3.5 07/25/2014 1100   ALBUMIN 3.7 11/02/2013 1040   AST 13 01/24/2017 0910   AST 16 07/25/2014 1100   AST 20 11/02/2013 1040   ALT 16 01/24/2017 0910   ALT 24 07/25/2014 1100   ALT 22 11/02/2013 1040   ALKPHOS 72 01/24/2017 0910   ALKPHOS 75 07/25/2014 1100   ALKPHOS 84 11/02/2013 1040   BILITOT 0.5 01/24/2017 0910   BILITOT 0.3 07/25/2014 1100   BILITOT 0.48 11/02/2013 1040   GFRNONAA >60 07/29/2016 0914   GFRNONAA >60 07/25/2014 1100   GFRAA >60 07/29/2016 0914   GFRAA >60 07/25/2014 1100    No results found for: SPEP, UPEP  Lab Results  Component Value Date   WBC 9.9 01/24/2017   NEUTROABS 6.0 01/24/2017   HGB 13.8 01/24/2017   HCT 40.9 01/24/2017   MCV 97.4 01/24/2017   PLT 243.0 01/24/2017      Chemistry      Component Value Date/Time   NA 143 01/24/2017 0910   NA 140 07/25/2014 1100   NA 140 11/02/2013 1040   K 4.0 01/24/2017 0910   K 4.1 07/25/2014 1100   K 3.9  11/02/2013 1040   CL 109 01/24/2017 0910   CL 105 07/25/2014 1100   CL 109 (H) 02/17/2013 1120   CO2 28 01/24/2017 0910   CO2 30 07/25/2014 1100   CO2 24 11/02/2013 1040   BUN 14 01/24/2017 0910   BUN 15 07/25/2014 1100   BUN 9.3 11/02/2013 1040   CREATININE 0.62 01/24/2017 0910   CREATININE 0.74 07/25/2014 1100   CREATININE 0.7 11/02/2013 1040      Component Value Date/Time   CALCIUM 9.2 01/24/2017 0910   CALCIUM 8.6 07/25/2014 1100   CALCIUM 9.5 11/02/2013 1040   ALKPHOS 72 01/24/2017 0910   ALKPHOS 75 07/25/2014 1100   ALKPHOS 84 11/02/2013 1040   AST 13 01/24/2017 0910   AST 16 07/25/2014 1100   AST 20 11/02/2013 1040   ALT 16 01/24/2017 0910   ALT 24 07/25/2014 1100   ALT 22 11/02/2013 1040   BILITOT 0.5 01/24/2017 0910   BILITOT 0.3 07/25/2014 1100   BILITOT 0.48 11/02/2013 1040       RADIOGRAPHIC STUDIES: I have personally reviewed the radiological images as listed and agreed with the findings in the report. No results found.   ASSESSMENT & PLAN:  Carcinoma of overlapping sites of left breast in female, estrogen receptor positive ([place]) Bilateral synchronous breast cancer-early stage. Patient currently on adjuvant Arimidex [started 2015]. No concerns for any recurrence based on physical exam/symptoms. Will order breast cancer index at next visit.  # BMD- June 2018-osteopenia- Ca+ vit. discussed re: prolia/ fosomax/boniva. No decisions made today.  # PN-1-2 sec to chemo;  intolerant to Neurontin/Lyrica. Discussed regardingcymbalta/ amiltryplline; pt intersted in accupuncture.    # follow up in 6 months/no labs. [thereafter in 1 year]. Will order screening mammo; diagnostic mammo- costs pt.    No orders of the defined types were placed in this encounter.  All questions were answered. The patient knows to call the clinic with any problems, questions or concerns.      Cammie Sickle, MD 02/17/2017 6:17 PM

## 2017-03-03 ENCOUNTER — Ambulatory Visit (INDEPENDENT_AMBULATORY_CARE_PROVIDER_SITE_OTHER)
Admission: RE | Admit: 2017-03-03 | Discharge: 2017-03-03 | Disposition: A | Discharge status: Home / Self Care | Payer: BLUE CROSS/BLUE SHIELD | Source: Ambulatory Visit | Attending: Family Medicine | Admitting: Family Medicine

## 2017-03-03 ENCOUNTER — Ambulatory Visit (INDEPENDENT_AMBULATORY_CARE_PROVIDER_SITE_OTHER): Payer: BLUE CROSS/BLUE SHIELD | Admitting: Family Medicine

## 2017-03-03 ENCOUNTER — Encounter: Payer: Self-pay | Admitting: Family Medicine

## 2017-03-03 VITALS — BP 110/70 | HR 98 | Temp 98.5°F | Ht 62.75 in | Wt 182.0 lb

## 2017-03-03 DIAGNOSIS — G8929 Other chronic pain: Secondary | ICD-10-CM

## 2017-03-03 DIAGNOSIS — M6788 Other specified disorders of synovium and tendon, other site: Secondary | ICD-10-CM

## 2017-03-03 DIAGNOSIS — M6281 Muscle weakness (generalized): Secondary | ICD-10-CM | POA: Diagnosis not present

## 2017-03-03 DIAGNOSIS — R29898 Other symptoms and signs involving the musculoskeletal system: Secondary | ICD-10-CM

## 2017-03-03 DIAGNOSIS — M7671 Peroneal tendinitis, right leg: Secondary | ICD-10-CM | POA: Diagnosis not present

## 2017-03-03 DIAGNOSIS — M25572 Pain in left ankle and joints of left foot: Secondary | ICD-10-CM | POA: Diagnosis not present

## 2017-03-03 NOTE — Patient Instructions (Signed)
Balance Exercises:  While brushing teeth, practice standing on 1 foot. Do for as long as you can, ideally 30 seconds to 1 minute each foot.
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
## 2017-03-04 ENCOUNTER — Encounter: Payer: Self-pay | Admitting: Family Medicine

## 2017-03-12 ENCOUNTER — Other Ambulatory Visit: Payer: Self-pay | Admitting: Internal Medicine

## 2017-03-12 DIAGNOSIS — M4316 Spondylolisthesis, lumbar region: Secondary | ICD-10-CM | POA: Diagnosis not present

## 2017-04-14 ENCOUNTER — Other Ambulatory Visit: Payer: Self-pay | Admitting: Family Medicine

## 2017-06-10 ENCOUNTER — Other Ambulatory Visit: Payer: Self-pay | Admitting: Internal Medicine

## 2017-07-14 ENCOUNTER — Telehealth: Payer: Self-pay | Admitting: Family Medicine

## 2017-07-14 DIAGNOSIS — Z0000 Encounter for general adult medical examination without abnormal findings: Secondary | ICD-10-CM

## 2017-07-14 DIAGNOSIS — R7309 Other abnormal glucose: Secondary | ICD-10-CM

## 2017-07-14 DIAGNOSIS — E7800 Pure hypercholesterolemia, unspecified: Secondary | ICD-10-CM

## 2017-07-14 DIAGNOSIS — Z862 Personal history of diseases of the blood and blood-forming organs and certain disorders involving the immune mechanism: Secondary | ICD-10-CM

## 2017-07-14 DIAGNOSIS — I10 Essential (primary) hypertension: Secondary | ICD-10-CM

## 2017-07-14 NOTE — Telephone Encounter (Signed)
-----   Message from Inocencio Homes, [place] sent at 07/14/2017 12:16 PM EST ----- Regarding: labs  Patient is on the lab schedule on 07/22/17, can you please order labs. Thank you.

## 2017-07-22 ENCOUNTER — Other Ambulatory Visit (INDEPENDENT_AMBULATORY_CARE_PROVIDER_SITE_OTHER): Payer: BLUE CROSS/BLUE SHIELD

## 2017-07-22 DIAGNOSIS — Z0000 Encounter for general adult medical examination without abnormal findings: Secondary | ICD-10-CM

## 2017-07-22 DIAGNOSIS — I10 Essential (primary) hypertension: Secondary | ICD-10-CM | POA: Diagnosis not present

## 2017-07-22 DIAGNOSIS — R7309 Other abnormal glucose: Secondary | ICD-10-CM | POA: Diagnosis not present

## 2017-07-22 DIAGNOSIS — Z862 Personal history of diseases of the blood and blood-forming organs and certain disorders involving the immune mechanism: Secondary | ICD-10-CM

## 2017-07-22 DIAGNOSIS — E7800 Pure hypercholesterolemia, unspecified: Secondary | ICD-10-CM | POA: Diagnosis not present

## 2017-07-22 LAB — CBC WITH DIFFERENTIAL/PLATELET
BASOS PCT: 0.4 % (ref 0.0–3.0)
Basophils Absolute: 0 10*3/uL (ref 0.0–0.1)
EOS ABS: 0.1 10*3/uL (ref 0.0–0.7)
EOS PCT: 1.2 % (ref 0.0–5.0)
HEMATOCRIT: 41.9 % (ref 36.0–46.0)
Hemoglobin: 13.8 g/dL (ref 12.0–15.0)
LYMPHS PCT: 27.4 % (ref 12.0–46.0)
Lymphs Abs: 2.6 10*3/uL (ref 0.7–4.0)
MCHC: 33 g/dL (ref 30.0–36.0)
MCV: 99.5 fl (ref 78.0–100.0)
Monocytes Absolute: 0.9 10*3/uL (ref 0.1–1.0)
Monocytes Relative: 9 % (ref 3.0–12.0)
Neutro Abs: 5.9 10*3/uL (ref 1.4–7.7)
Neutrophils Relative %: 62 % (ref 43.0–77.0)
PLATELETS: 249 10*3/uL (ref 150.0–400.0)
RBC: 4.21 Mil/uL (ref 3.87–5.11)
RDW: 13.6 % (ref 11.5–15.5)
WBC: 9.6 10*3/uL (ref 4.0–10.5)

## 2017-07-22 LAB — COMPREHENSIVE METABOLIC PANEL
ALT: 15 U/L (ref 0–35)
AST: 14 U/L (ref 0–37)
Albumin: 3.9 g/dL (ref 3.5–5.2)
Alkaline Phosphatase: 70 U/L (ref 39–117)
BUN: 18 mg/dL (ref 6–23)
CALCIUM: 9.6 mg/dL (ref 8.4–10.5)
CHLORIDE: 107 meq/L (ref 96–112)
CO2: 29 meq/L (ref 19–32)
CREATININE: 0.65 mg/dL (ref 0.40–1.20)
GFR: 99.92 mL/min (ref >60.00)
Glucose, Bld: 120 mg/dL — ABNORMAL HIGH (ref 70–99)
POTASSIUM: 5 meq/L (ref 3.5–5.1)
Sodium: 143 mEq/L (ref 135–145)
Total Bilirubin: 0.6 mg/dL (ref 0.2–1.2)
Total Protein: 6.3 g/dL (ref 6.0–8.3)

## 2017-07-22 LAB — LIPID PANEL
CHOL/HDL RATIO: 3
Cholesterol: 130 mg/dL (ref 0–200)
HDL: 45.5 mg/dL (ref >39.00)
LDL CALC: 74 mg/dL (ref 0–99)
NonHDL: 84.17
TRIGLYCERIDES: 53 mg/dL (ref 0.0–149.0)
VLDL: 10.6 mg/dL (ref 0.0–40.0)

## 2017-07-22 LAB — TSH: TSH: 2.75 u[IU]/mL (ref 0.35–4.50)

## 2017-07-22 LAB — HEMOGLOBIN A1C: Hgb A1c MFr Bld: 5.9 % (ref 4.6–6.5)

## 2017-07-30 ENCOUNTER — Other Ambulatory Visit: Payer: Self-pay

## 2017-07-30 ENCOUNTER — Encounter: Payer: Self-pay | Admitting: Family Medicine

## 2017-07-30 ENCOUNTER — Inpatient Hospital Stay: Payer: BLUE CROSS/BLUE SHIELD | Attending: Internal Medicine | Admitting: Internal Medicine

## 2017-07-30 ENCOUNTER — Ambulatory Visit: Payer: BLUE CROSS/BLUE SHIELD | Admitting: Family Medicine

## 2017-07-30 ENCOUNTER — Ambulatory Visit
Admission: RE | Admit: 2017-07-30 | Discharge: 2017-07-30 | Disposition: A | Discharge status: Home / Self Care | Payer: BLUE CROSS/BLUE SHIELD | Source: Ambulatory Visit | Attending: Radiation Oncology | Admitting: Radiation Oncology

## 2017-07-30 ENCOUNTER — Encounter: Payer: Self-pay | Admitting: Radiation Oncology

## 2017-07-30 VITALS — BP 134/84 | HR 79 | Temp 98.2°F | Ht 62.75 in | Wt 177.8 lb

## 2017-07-30 VITALS — BP 138/82 | HR 72 | Temp 98.2°F | Wt 178.2 lb

## 2017-07-30 VITALS — BP 138/82 | HR 72 | Temp 98.2°F | Resp 16

## 2017-07-30 DIAGNOSIS — Z923 Personal history of irradiation: Secondary | ICD-10-CM | POA: Insufficient documentation

## 2017-07-30 DIAGNOSIS — Z79899 Other long term (current) drug therapy: Secondary | ICD-10-CM

## 2017-07-30 DIAGNOSIS — F17210 Nicotine dependence, cigarettes, uncomplicated: Secondary | ICD-10-CM | POA: Insufficient documentation

## 2017-07-30 DIAGNOSIS — Z79810 Long term (current) use of selective estrogen receptor modulators (SERMs): Secondary | ICD-10-CM | POA: Diagnosis not present

## 2017-07-30 DIAGNOSIS — M8580 Other specified disorders of bone density and structure, unspecified site: Secondary | ICD-10-CM | POA: Diagnosis not present

## 2017-07-30 DIAGNOSIS — E7800 Pure hypercholesterolemia, unspecified: Secondary | ICD-10-CM

## 2017-07-30 DIAGNOSIS — F17200 Nicotine dependence, unspecified, uncomplicated: Secondary | ICD-10-CM

## 2017-07-30 DIAGNOSIS — E785 Hyperlipidemia, unspecified: Secondary | ICD-10-CM | POA: Diagnosis not present

## 2017-07-30 DIAGNOSIS — C50912 Malignant neoplasm of unspecified site of left female breast: Secondary | ICD-10-CM | POA: Diagnosis not present

## 2017-07-30 DIAGNOSIS — R7309 Other abnormal glucose: Secondary | ICD-10-CM

## 2017-07-30 DIAGNOSIS — Z23 Encounter for immunization: Secondary | ICD-10-CM | POA: Diagnosis not present

## 2017-07-30 DIAGNOSIS — Z170 Estrogen receptor positive status [ER+]: Secondary | ICD-10-CM | POA: Insufficient documentation

## 2017-07-30 DIAGNOSIS — Z8673 Personal history of transient ischemic attack (TIA), and cerebral infarction without residual deficits: Secondary | ICD-10-CM

## 2017-07-30 DIAGNOSIS — I10 Essential (primary) hypertension: Secondary | ICD-10-CM | POA: Insufficient documentation

## 2017-07-30 DIAGNOSIS — Z7982 Long term (current) use of aspirin: Secondary | ICD-10-CM | POA: Diagnosis not present

## 2017-07-30 DIAGNOSIS — F418 Other specified anxiety disorders: Secondary | ICD-10-CM

## 2017-07-30 DIAGNOSIS — C50812 Malignant neoplasm of overlapping sites of left female breast: Secondary | ICD-10-CM | POA: Diagnosis not present

## 2017-07-30 DIAGNOSIS — Z800 Family history of malignant neoplasm of digestive organs: Secondary | ICD-10-CM | POA: Diagnosis not present

## 2017-07-30 DIAGNOSIS — K219 Gastro-esophageal reflux disease without esophagitis: Secondary | ICD-10-CM | POA: Diagnosis not present

## 2017-07-30 DIAGNOSIS — H409 Unspecified glaucoma: Secondary | ICD-10-CM

## 2017-07-30 DIAGNOSIS — Z9221 Personal history of antineoplastic chemotherapy: Secondary | ICD-10-CM | POA: Diagnosis not present

## 2017-07-30 DIAGNOSIS — Z79811 Long term (current) use of aromatase inhibitors: Secondary | ICD-10-CM | POA: Diagnosis not present

## 2017-07-30 DIAGNOSIS — C50911 Malignant neoplasm of unspecified site of right female breast: Secondary | ICD-10-CM | POA: Diagnosis not present

## 2017-07-30 DIAGNOSIS — Z171 Estrogen receptor negative status [ER-]: Secondary | ICD-10-CM | POA: Insufficient documentation

## 2017-07-30 NOTE — Progress Notes (Signed)
[place] OFFICE PROGRESS NOTE  Patient Care Team: Tower, Wynelle Fanny, MD as PCP - General  Cancer of breast [facility])   Staging form: Breast, AJCC 7th Edition     Clinical: Stage IA (T1c, N0, M0) - Unsigned    Oncology History   # 2014- SYNCHRONOUS BIL BREAST CA [Dr.K. Humphrey Rolls; [place]]  # RIGHT-IMC; lumpec & ALND-T1cN0; TNBC s/p RT  # LEFT-IMC;STAGE II  T1bN1 ER/PR-Pos Her 2 Neu-NEG s/p Lumpec & RT  # s/p AC x4- Taxol/ gem-Carbo  # MARCH 2015- STARTED Arimidex   # BMD- 2018- osteopenia  # BRCA testing- NEG [as per pt]     Carcinoma of overlapping sites of left breast in female, estrogen receptor positive ([place])    INTERVAL HISTORY:  Amy Leonard 56 y.o.  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 She had a recent mammogram/DEXA scan.   Patient denies any new lumps or bumps. Denies any headaches. Denies any unusual cough or chest pain or weight loss.  Occasional hot flashes.  REVIEW OF SYSTEMS:  A complete 10 point review of system is done which is negative except mentioned above/history of present illness.   PAST MEDICAL HISTORY :  Past Medical History:  Diagnosis Date  . Anxiety   . Anxiety and depression   . Barrett's esophageal ulceration   . 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 Breast cancer ([place]) 2014   BILATERAL LUMPECTOMY  . Carpal tunnel syndrome   . Chronic back pain    spinal stenosis  . Depression   . Elevated WBC count    nl diff  . Erosive gastritis   . Folliculitis 11/01/1827  . GERD (gastroesophageal reflux disease)   . Glaucoma    ?  Marland Kitchen History of chemotherapy 2014   BREAST CA  . HLD (hyperlipidemia)   . HSV infection    recurrent (side and buttox)  . Hypertension   . Migraine   . Personal history of chemotherapy 2014   bilateral breast ca  . Personal history of radiation therapy 2014   bilateral breast ca  . Radiation 2014   BREAST CA  .  Tobacco abuse   . Wears dentures    top    PAST SURGICAL HISTORY :   Past Surgical History:  Procedure Laterality Date  . BREAST LUMPECTOMY Bilateral 2014   bilateral breast ca  . BTL    . COLONOSCOPY    . epidural steroid injection  2008  . LUMBAR FUSION  03/2016   L4-5  . PARTIAL MASTECTOMY WITH NEEDLE LOCALIZATION AND AXILLARY SENTINEL LYMPH NODE BX Bilateral 11/23/2012   Procedure: PARTIAL MASTECTOMY WITH NEEDLE LOCALIZATION AND AXILLARY SENTINEL LYMPH NODE BX;  Surgeon: Adin Hector, MD;  Location: [place];  Service: General;  Laterality: Bilateral;  . PORT-A-CATH REMOVAL Right 08/25/2013   Procedure: REMOVAL PORT-A-CATH;  Surgeon: Adin Hector, MD;  Location: [place];  Service: General;  Laterality: Right;  . PORTACATH PLACEMENT Right 11/23/2012   Procedure: INSERTION PORT-A-CATH;  Surgeon: Adin Hector, MD;  Location: [place];  Service: General;  Laterality: Right;  . UPPER GI ENDOSCOPY      FAMILY HISTORY :   Family History  Problem Relation Age of Onset  . Stroke Maternal Grandmother   . Diabetes Maternal Grandmother   . Diabetes Mother   . Hypertension Mother   . Leukemia Mother   .  Obesity Mother   . Depression Sister   . Bipolar disorder Son        Bipolar / oppositional defiant  . Alcohol abuse Sister   . Drug abuse Sister   . Alcohol abuse Sister   . Drug abuse Sister   . Alcohol abuse Brother   . Drug abuse Brother   . Pancreatic cancer Brother   . Colon cancer Neg Hx   . Breast cancer Neg Hx     SOCIAL HISTORY:   Social History   Tobacco Use  . Smoking status: Current Every Day Smoker    Packs/day: 0.50    Years: 33.00    Pack years: 16.50    Types: Cigarettes  . Smokeless tobacco: Never Used  . Tobacco comment: no smoking since 02/27/16  Substance Use Topics  . Alcohol use: Yes    Alcohol/week: 0.0 oz    Comment: beer rarely  . Drug use: No    ALLERGIES:  is allergic to  gabapentin; lyrica [pregabalin]; bupropion hcl; chlorhexidine gluconate; hydrocod polst-cpm polst er; lisinopril; and naproxen sodium.  MEDICATIONS:  Current Outpatient Medications  Medication Sig Dispense Refill  . Acetaminophen (TYLENOL ARTHRITIS PAIN PO) Take 2 tablets by mouth as needed (pain).     Marland Kitchen anastrozole (ARIMIDEX) 1 MG tablet TAKE 1 TABLET DAILY 90 tablet 0  . aspirin 81 MG chewable tablet Chew 1 tablet (81 mg total) by mouth daily. 90 tablet 3  . atorvastatin (LIPITOR) 10 MG tablet Take 1 tablet (10 mg total) by mouth daily. 90 tablet 3  . b complex vitamins tablet Take 1 tablet by mouth daily.    . calcium carbonate (TUMS - DOSED IN MG ELEMENTAL CALCIUM) 500 MG chewable tablet Chew by mouth as directed.      . Calcium Carbonate-Vitamin D (CALCIUM + D PO) Take 1 tablet by mouth daily.    Marland Kitchen omeprazole (PRILOSEC) 40 MG capsule Take 1 capsule (40 mg total) by mouth daily. 90 capsule 0  . ranitidine (ZANTAC) 300 MG tablet Take 150 mg by mouth at bedtime.      No current facility-administered medications for this visit.     PHYSICAL EXAMINATION: ECOG PERFORMANCE STATUS: 0 - Asymptomatic  BP 138/82   Pulse 72   Temp 98.2 F (36.8 C) (Tympanic)   Resp 16   There were no vitals filed for this visit.  GENERAL: Well-nourished well-developed; Alert, no distress and comfortable.   Alone EYES: no pallor or icterus OROPHARYNX: no thrush or ulceration; good dentition  NECK: supple, no masses felt LYMPH:  no palpable lymphadenopathy in the cervical, axillary or inguinal regions LUNGS: clear to auscultation and  No wheeze or crackles HEART/CVS: regular rate & rhythm and no murmurs; No lower extremity edema ABDOMEN:abdomen soft, non-tender and normal bowel sounds Musculoskeletal:no cyanosis of digits and no clubbing  PSYCH: alert & oriented x 3 with fluent speech NEURO: no focal motor/sensory deficits SKIN:  no rashes or significant lesions Right and left BREAST exam [in the  presence of nurse]- no unusual skin changes or dominant masses felt. Surgical scars noted.   LABORATORY DATA:  I have reviewed the data as listed    Component Value Date/Time   NA 143 07/22/2017 0828   NA 140 07/25/2014 1100   NA 140 11/02/2013 1040   K 5.0 07/22/2017 0828   K 4.1 07/25/2014 1100   K 3.9 11/02/2013 1040   CL 107 07/22/2017 0828   CL 105 07/25/2014 1100   CL 109 (  H) 02/17/2013 1120   CO2 29 07/22/2017 0828   CO2 30 07/25/2014 1100   CO2 24 11/02/2013 1040   GLUCOSE 120 (H) 07/22/2017 0828   GLUCOSE 97 07/25/2014 1100   GLUCOSE 103 11/02/2013 1040   GLUCOSE 99 02/17/2013 1120   BUN 18 07/22/2017 0828   BUN 15 07/25/2014 1100   BUN 9.3 11/02/2013 1040   CREATININE 0.65 07/22/2017 0828   CREATININE 0.74 07/25/2014 1100   CREATININE 0.7 11/02/2013 1040   CALCIUM 9.6 07/22/2017 0828   CALCIUM 8.6 07/25/2014 1100   CALCIUM 9.5 11/02/2013 1040   PROT 6.3 07/22/2017 0828   PROT 6.4 07/25/2014 1100   PROT 6.2 (L) 11/02/2013 1040   ALBUMIN 3.9 07/22/2017 0828   ALBUMIN 3.5 07/25/2014 1100   ALBUMIN 3.7 11/02/2013 1040   AST 14 07/22/2017 0828   AST 16 07/25/2014 1100   AST 20 11/02/2013 1040   ALT 15 07/22/2017 0828   ALT 24 07/25/2014 1100   ALT 22 11/02/2013 1040   ALKPHOS 70 07/22/2017 0828   ALKPHOS 75 07/25/2014 1100   ALKPHOS 84 11/02/2013 1040   BILITOT 0.6 07/22/2017 0828   BILITOT 0.3 07/25/2014 1100   BILITOT 0.48 11/02/2013 1040   GFRNONAA >60 07/29/2016 0914   GFRNONAA >60 07/25/2014 1100   GFRAA >60 07/29/2016 0914   GFRAA >60 07/25/2014 1100    No results found for: SPEP, UPEP  Lab Results  Component Value Date   WBC 9.6 07/22/2017   NEUTROABS 5.9 07/22/2017   HGB 13.8 07/22/2017   HCT 41.9 07/22/2017   MCV 99.5 07/22/2017   PLT 249.0 07/22/2017      Chemistry      Component Value Date/Time   NA 143 07/22/2017 0828   NA 140 07/25/2014 1100   NA 140 11/02/2013 1040   K 5.0 07/22/2017 0828   K 4.1 07/25/2014 1100   K 3.9  11/02/2013 1040   CL 107 07/22/2017 0828   CL 105 07/25/2014 1100   CL 109 (H) 02/17/2013 1120   CO2 29 07/22/2017 0828   CO2 30 07/25/2014 1100   CO2 24 11/02/2013 1040   BUN 18 07/22/2017 0828   BUN 15 07/25/2014 1100   BUN 9.3 11/02/2013 1040   CREATININE 0.65 07/22/2017 0828   CREATININE 0.74 07/25/2014 1100   CREATININE 0.7 11/02/2013 1040      Component Value Date/Time   CALCIUM 9.6 07/22/2017 0828   CALCIUM 8.6 07/25/2014 1100   CALCIUM 9.5 11/02/2013 1040   ALKPHOS 70 07/22/2017 0828   ALKPHOS 75 07/25/2014 1100   ALKPHOS 84 11/02/2013 1040   AST 14 07/22/2017 0828   AST 16 07/25/2014 1100   AST 20 11/02/2013 1040   ALT 15 07/22/2017 0828   ALT 24 07/25/2014 1100   ALT 22 11/02/2013 1040   BILITOT 0.6 07/22/2017 0828   BILITOT 0.3 07/25/2014 1100   BILITOT 0.48 11/02/2013 1040       RADIOGRAPHIC STUDIES: I have personally reviewed the radiological images as listed and agreed with the findings in the report. No results found.   ASSESSMENT & PLAN:  Carcinoma of overlapping sites of left breast in female, estrogen receptor positive ([place]) Bilateral synchronous breast cancer-early stage. Patient currently on adjuvant Arimidex [started 2015]. No concerns for any recurrence based on physical exam/symptoms.   # BMD- June 2018-osteopenia- Ca+ vit. Discussed the potential risk factors for osteoporosis- age/gender/postmenopausal status/use of anti-estrogen treatments. Discussed multiple options including exercise/ calcium and vitamin D supplementation/ and  also use of bisphosphonates. Discussed oral bisphosphonates versus parenteral bisphosphonate like Reclast/ RANK ligand inhibitors- desnosumab.  # PN-1-2 sec to chemo; intolerant to Neurontin/Lyrica.s/p accupuncture- helped a lot.    # mammogram in end of April 2019; follow up with me in 12 month/ no labs.    Orders Placed This Encounter  Procedures  . MM DIAG BREAST TOMO BILATERAL    Standing Status:   Future     Standing Expiration Date:   07/30/2018    Order Specific Question:   Reason for Exam (SYMPTOM  OR DIAGNOSIS REQUIRED)    Answer:   history of left and right DCIS    Order Specific Question:   Is the patient pregnant?    Answer:   No    Order Specific Question:   Preferred imaging location?    Answer:   [facility]  . US Breast Limited Uni Left [facility] [place]    Standing Status:   Future    Standing Expiration Date:   09/30/2018    Order Specific Question:   Reason for Exam (SYMPTOM  OR DIAGNOSIS REQUIRED)    Answer:   history of left/right DCIS    Order Specific Question:   Preferred imaging location?    Answer:   [facility]  . US Breast Limited Uni Right Inc [place]    Standing Status:   Future    Standing Expiration Date:   09/30/2018    Order Specific Question:   Reason for Exam (SYMPTOM  OR DIAGNOSIS REQUIRED)    Answer:   history of left and right DCIS    Order Specific Question:   Preferred imaging location?    Answer:   [facility]   All questions were answered. The patient knows to call the clinic with any problems, questions or concerns.      Cammie Sickle, MD 08/12/2017 5:05 PM

## 2017-07-30 NOTE — Assessment & Plan Note (Addendum)
Bilateral synchronous breast cancer-early stage. Patient currently on adjuvant Arimidex [started 2015]. No concerns for any recurrence based on physical exam/symptoms.   # BMD- June 2018-osteopenia- Ca+ vit. Discussed the potential risk factors for osteoporosis- age/gender/postmenopausal status/use of anti-estrogen treatments. Discussed multiple options including exercise/ calcium and vitamin D supplementation/ and also use of bisphosphonates. Discussed oral bisphosphonates versus parenteral bisphosphonate like Reclast/ RANK ligand inhibitors- desnosumab.  # PN-1-2 sec to chemo; intolerant to Neurontin/Lyrica.s/p accupuncture- helped a lot.    # mammogram in end of April 2019; follow up with me in 12 month/ no labs.

## 2017-07-30 NOTE — Patient Instructions (Addendum)
To prevent diabetes: Try to get most of your carbohydrates from produce (with the exception of white potatoes)  Eat less bread/pasta/rice/snack foods/cereals/sweets and other items from the middle of the grocery store (processed carbs)   Take advantage of program at work to quit   Check with your insurance /pharmacy about the Shingrix vaccine  Or the health better   Flu shot today  Pneumonia vaccine today   Schedule your physical in 6 months

## 2017-07-30 NOTE — Progress Notes (Signed)
Subjective:    Patient ID: Amy Leonard, female    DOB: 1961/02/26, 56 y.o.   MRN: [id number]  HPI  Here for f/u of chronic health problems   Doing well with breast cancer- was seen today by rad and onc  As of today gets to spread f/u out to yearly   Feeling pretty good also More sluggish if she does not take her B complex and D and Ca  Less tired now    Wt Readings from Last 3 Encounters:  07/30/17 177 lb 12 oz (80.6 kg)  07/30/17 178 lb 3.9 oz (80.8 kg)  03/03/17 182 lb (82.6 kg)  wt is down a bit  Active job  Does not always have time to stop and eat (too much convenience food) 31.74 kg/m   bp is stable today  Hx of cva on 81 mg asa daily  No cp or palpitations or headaches or edema  No side effects to medicines  BP Readings from Last 3 Encounters:  07/30/17 134/84  07/30/17 138/82  07/30/17 138/82    Labs reviewed   Hyperlipidemia Lab Results  Component Value Date   CHOL 130 07/22/2017   CHOL 128 01/24/2017   CHOL 139 02/09/2015   Lab Results  Component Value Date   HDL 45.50 07/22/2017   HDL 48.60 01/24/2017   HDL 49.30 02/09/2015   Lab Results  Component Value Date   LDLCALC 74 07/22/2017   LDLCALC 65 01/24/2017   LDLCALC 76 02/09/2015   Lab Results  Component Value Date   TRIG 53.0 07/22/2017   TRIG 70.0 01/24/2017   TRIG 69.0 02/09/2015   Lab Results  Component Value Date   CHOLHDL 3 07/22/2017   CHOLHDL 3 01/24/2017   CHOLHDL 3 02/09/2015   Lab Results  Component Value Date   LDLDIRECT 130.3 01/26/2010   On atorvastatin and diet  Goal for LDL is under 70 Diet is fair  Had a lot of holiday food/ over ate   Hx of prediabetes Lab Results  Component Value Date   HGBA1C 5.9 07/22/2017  active job  Eats too much chocolate  Smoking status -still smoking 3/4 to 1ppd  She is planning to quit - they have a program through work and can get chantix or nicotine repl   Needs flu vaccine  Needs pneumonia vaccine   Patient Active  Problem List   Diagnosis Date Noted  . Elevated glucose 07/14/2017  . Carcinoma of overlapping sites of left breast in female, estrogen receptor positive ([place]) 07/29/2016  . Dizzy 02/12/2016  . Lymphedema 10/23/2015  . Carpal tunnel syndrome 10/23/2015  . Adverse effect of drug/medicinal 09/22/2015  . Encounter for routine gynecological examination 02/10/2015  . Routine general medical examination at a health care facility 02/07/2015  . Degenerative disc disease, lumbar 02/04/2014  . Anterolisthesis 02/04/2014  . Low back pain 02/02/2014  . Abnormal urinalysis 02/02/2014  . Postmenopausal estrogen deficiency 09/02/2013  . History of thrombocytopenia 01/06/2013  . 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 History of CVA (cerebrovascular accident) 04/17/2012  . Pure hypercholesterolemia 04/05/2008  . Essential hypertension 03/30/2008  . ANXIETY DEPRESSION 11/17/2007  . TOBACCO USE 11/17/2007  . BACK PAIN, CHRONIC 09/23/2007   Past Medical History:  Diagnosis Date  . Anxiety   . Anxiety and depression   . Barrett's esophageal ulceration   . 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 Breast cancer ([place]) 2014   BILATERAL LUMPECTOMY  . Carpal tunnel syndrome   . Chronic  back pain    spinal stenosis  . Depression   . Elevated WBC count    nl diff  . Erosive gastritis   . Folliculitis 7/0/3500  . GERD (gastroesophageal reflux disease)   . Glaucoma    ?  Marland Kitchen History of chemotherapy 2014   BREAST CA  . HLD (hyperlipidemia)   . HSV infection    recurrent (side and buttox)  . Hypertension   . Migraine   . Personal history of chemotherapy 2014   bilateral breast ca  . Personal history of radiation therapy 2014   bilateral breast ca  . Radiation 2014   BREAST CA  . Tobacco abuse   . Wears dentures    top   Past Surgical History:  Procedure Laterality Date  . BREAST LUMPECTOMY Bilateral 2014   bilateral breast ca  . BTL    . COLONOSCOPY    . epidural steroid injection  2008    . LUMBAR FUSION  03/2016   L4-5  . PARTIAL MASTECTOMY WITH NEEDLE LOCALIZATION AND AXILLARY SENTINEL LYMPH NODE BX Bilateral 11/23/2012   Procedure: PARTIAL MASTECTOMY WITH NEEDLE LOCALIZATION AND AXILLARY SENTINEL LYMPH NODE BX;  Surgeon: Adin Hector, MD;  Location: [place];  Service: General;  Laterality: Bilateral;  . PORT-A-CATH REMOVAL Right 08/25/2013   Procedure: REMOVAL PORT-A-CATH;  Surgeon: Adin Hector, MD;  Location: [place];  Service: General;  Laterality: Right;  . PORTACATH PLACEMENT Right 11/23/2012   Procedure: INSERTION PORT-A-CATH;  Surgeon: Adin Hector, MD;  Location: [place];  Service: General;  Laterality: Right;  . UPPER GI ENDOSCOPY     Social History   Tobacco Use  . Smoking status: Current Every Day Smoker    Packs/day: 0.50    Years: 33.00    Pack years: 16.50    Types: Cigarettes  . Smokeless tobacco: Never Used  . Tobacco comment: no smoking since 02/27/16  Substance Use Topics  . Alcohol use: Yes    Alcohol/week: 0.0 oz    Comment: beer rarely  . Drug use: No   Family History  Problem Relation Age of Onset  . Stroke Maternal Grandmother   . Diabetes Maternal Grandmother   . Diabetes Mother   . Hypertension Mother   . Leukemia Mother   . Obesity Mother   . Depression Sister   . Bipolar disorder Son        Bipolar / oppositional defiant  . Alcohol abuse Sister   . Drug abuse Sister   . Alcohol abuse Sister   . Drug abuse Sister   . Alcohol abuse Brother   . Drug abuse Brother   . Pancreatic cancer Brother   . Colon cancer Neg Hx   . Breast cancer Neg Hx    Allergies  Allergen Reactions  . Gabapentin Swelling  . Lyrica [Pregabalin] Other (See Comments)    sedated  . Bupropion Hcl Nausea Only and Other (See Comments)     GI, sleepy  . Chlorhexidine Gluconate Itching and Rash  . Hydrocod Polst-Cpm Polst Er Nausea And Vomiting    vomiting  . Lisinopril Other (See  Comments)    Leg cramps    . Naproxen Sodium Other (See Comments)    does not tolerate   Current Outpatient Medications on File Prior to Visit  Medication Sig Dispense Refill  . Acetaminophen (TYLENOL ARTHRITIS PAIN PO) Take 2 tablets by mouth as needed (pain).     Marland Kitchen anastrozole (  ARIMIDEX) 1 MG tablet TAKE 1 TABLET DAILY 90 tablet 0  . aspirin 81 MG chewable tablet Chew 1 tablet (81 mg total) by mouth daily. 90 tablet 3  . atorvastatin (LIPITOR) 10 MG tablet Take 1 tablet (10 mg total) by mouth daily. 90 tablet 3  . b complex vitamins tablet Take 1 tablet by mouth daily.    . calcium carbonate (TUMS - DOSED IN MG ELEMENTAL CALCIUM) 500 MG chewable tablet Chew by mouth as directed.      . Calcium Carbonate-Vitamin D (CALCIUM + D PO) Take 1 tablet by mouth daily.    Marland Kitchen omeprazole (PRILOSEC) 40 MG capsule Take 1 capsule (40 mg total) by mouth daily. 90 capsule 0  . ranitidine (ZANTAC) 300 MG tablet Take 150 mg by mouth at bedtime.      No current facility-administered medications on file prior to visit.     Review of Systems  Constitutional: Negative for activity change, appetite change, fatigue, fever and unexpected weight change.  HENT: Negative for congestion, ear pain, rhinorrhea, sinus pressure and sore throat.   Eyes: Negative for pain, redness and visual disturbance.  Respiratory: Negative for cough, shortness of breath and wheezing.   Cardiovascular: Negative for chest pain and palpitations.  Gastrointestinal: Negative for abdominal pain, blood in stool, constipation and diarrhea.  Endocrine: Negative for polydipsia and polyuria.  Genitourinary: Negative for dysuria, frequency and urgency.  Musculoskeletal: Positive for arthralgias. Negative for back pain and myalgias.  Skin: Negative for pallor and rash.  Allergic/Immunologic: Negative for environmental allergies.  Neurological: Negative for dizziness, syncope and headaches.  Hematological: Negative for adenopathy. Does not  bruise/bleed easily.  Psychiatric/Behavioral: Negative for decreased concentration and dysphoric mood. The patient is not nervous/anxious.        Objective:   Physical Exam  Constitutional: She appears well-developed and well-nourished. No distress.  obese and well appearing   HENT:  Head: Normocephalic and atraumatic.  Mouth/Throat: Oropharynx is clear and moist.  Eyes: Conjunctivae and EOM are normal. Pupils are equal, round, and reactive to light.  Neck: Normal range of motion. Neck supple. No JVD present. Carotid bruit is not present. No thyromegaly present.  Cardiovascular: Normal rate, regular rhythm, normal heart sounds and intact distal pulses. Exam reveals no gallop.  Pulmonary/Chest: Effort normal and breath sounds normal. No respiratory distress. She has no wheezes. She has no rales.  No crackles  Diffusely distant bs No wheeze today  Abdominal: Soft. Bowel sounds are normal. She exhibits no distension, no abdominal bruit and no mass. There is no tenderness.  Musculoskeletal: She exhibits no edema.  Lymphadenopathy:    She has no cervical adenopathy.  Neurological: She is alert. She has normal reflexes.  Skin: Skin is warm and dry. No rash noted. No pallor.  Psychiatric: She has a normal mood and affect.          Assessment & Plan:   Problem List Items Addressed This Visit      Cardiovascular and Mediastinum   Essential hypertension - Primary (Chronic)    bp in fair control at this time  BP Readings from Last 1 Encounters:  07/30/17 138/82   No changes needed Disc lifstyle change with low sodium diet and exercise  Labs reviewed Wt loss enc          Other   Elevated glucose    Lab Results  Component Value Date   HGBA1C 5.9 07/22/2017   disc imp of low glycemic diet and wt loss to  prevent DM2 Enc wt loss program       History of CVA (cerebrovascular accident)    Disc need for LDL under 70 and also lower A1C Watching bp  Continue ASA Smoking  cessation is most important      Pure hypercholesterolemia    Disc goals for lipids and reasons to control them Rev labs with pt Rev low sat fat diet in detail LDL is up slightly- goal is below 70 in light of cva hx  Will make effort to eat better Continue lipitor      TOBACCO USE    Disc in detail risks of smoking and possible outcomes including copd, vascular/ heart disease, cancer , respiratory and sinus infections  Pt voices understanding Pt plans to use program at work for smoking cessation that incl medication if needed       Other Visit Diagnoses    Need for 23-polyvalent pneumococcal polysaccharide vaccine       Relevant Orders   Pneumococcal polysaccharide vaccine 23-valent greater than or equal to 2yo subcutaneous/IM (Completed)   Need for influenza vaccination       Relevant Orders   Flu Vaccine QUAD 6+ mos PF IM (Fluarix Quad PF) (Completed)

## 2017-07-30 NOTE — Progress Notes (Signed)
Radiation Oncology Follow up Note  Name: Amy Leonard   Date:   07/30/2017 MRN:  [id number] DOB: 17-Feb-1961    This 56 y.o. female presents to the clinic today for 4 year follow-up status post bilateral breast radiation.  REFERRING PROVIDER: Tower, Wynelle Fanny, MD  HPI: Patient is a 56 year old female now out 4 years having completed bilateral breast radiation. 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 She is seen today in routine follow-up and is doing well. She does feel some fullness in her right axilla. She otherwise specifically denies breast tenderness cough or bone pain.. She's had bilateral diagnostic mammograms back in April 2018 both BI-RADS 2 benign. She's currently on arimadex tongue that well without side effect.  COMPLICATIONS OF TREATMENT: none  FOLLOW UP COMPLIANCE: keeps appointments   PHYSICAL EXAM:  BP 138/82   Pulse 72   Temp 98.2 F (36.8 C)   Wt 178 lb 3.9 oz (80.8 kg)   BMI 31.83 kg/m  Lungs are clear to A&P cardiac examination essentially unremarkable with regular rate and rhythm. No dominant mass or nodularity is noted in either breast in 2 positions examined. Incision is well-healed. No axillary or supraclavicular adenopathy is appreciated. Cosmetic result is excellent. Do not detect any mass or nodularity in her right axilla just some scar tissue. Well-developed well-nourished patient in NAD. HEENT reveals PERLA, EOMI, discs not visualized.  Oral cavity is clear. No oral mucosal lesions are identified. Neck is clear without evidence of cervical or supraclavicular adenopathy. Lungs are clear to A&P. Cardiac examination is essentially unremarkable with regular rate and rhythm without murmur rub or thrill. Abdomen is benign with no organomegaly or masses noted. Motor sensory and DTR levels are equal and symmetric in the upper and lower extremities. Cranial nerves II through XII  are grossly intact. Proprioception is intact. No peripheral adenopathy or edema is identified. No motor or sensory levels are noted. Crude visual fields are within normal range.  RADIOLOGY RESULTS: Bilateral mammograms are reviewed  PLAN: Present time patient continues to do well with no evidence of disease. I will see her back in 1 year for follow-up and then discontinue follow-up care. Patient continues on arimadex without side effect. She's are scheduled for follow-up mammograms in April. Patient is to call with any concerns.  I would like to take this opportunity to thank you for allowing me to participate in the care of your patient.Armstead Peaks., MD

## 2017-07-31 NOTE — Assessment & Plan Note (Signed)
Disc goals for lipids and reasons to control them Rev labs with pt Rev low sat fat diet in detail LDL is up slightly- goal is below 70 in light of cva hx  Will make effort to eat better Continue lipitor

## 2017-07-31 NOTE — Assessment & Plan Note (Signed)
bp in fair control at this time  BP Readings from Last 1 Encounters:  07/30/17 138/82   No changes needed Disc lifstyle change with low sodium diet and exercise  Labs reviewed Wt loss enc

## 2017-07-31 NOTE — Assessment & Plan Note (Addendum)
Disc need for LDL under 70 and also lower A1C Watching bp  Continue ASA Smoking cessation is most important

## 2017-07-31 NOTE — Assessment & Plan Note (Signed)
Lab Results  Component Value Date   HGBA1C 5.9 07/22/2017   disc imp of low glycemic diet and wt loss to prevent DM2 Enc wt loss program

## 2017-07-31 NOTE — Assessment & Plan Note (Signed)
Disc in detail risks of smoking and possible outcomes including copd, vascular/ heart disease, cancer , respiratory and sinus infections  Pt voices understanding Pt plans to use program at work for smoking cessation that incl medication if needed

## 2017-08-31 ENCOUNTER — Other Ambulatory Visit: Payer: Self-pay | Admitting: Internal Medicine

## 2017-09-01 ENCOUNTER — Ambulatory Visit: Payer: BLUE CROSS/BLUE SHIELD | Admitting: Internal Medicine

## 2017-11-03 ENCOUNTER — Ambulatory Visit: Payer: Self-pay

## 2017-11-03 NOTE — Telephone Encounter (Signed)
Left VM letting pt know Dr. Tower's comments  

## 2017-11-03 NOTE — Telephone Encounter (Signed)
Pt. Reports she has periods of dizziness and sleeping "a lot more than normal." Reports she has dizziness with changing positions and "I just feel bad.I don't feel like myself. My son said I was like this before my cancer diagnosis 5 years ago." Having some "headaches periodically and sometimes I'm cranky." Appointment for tomorrow already made by agent.  Reason for Disposition . [1] MODERATE dizziness (e.g., interferes with normal activities) AND [2] has NOT been evaluated by physician for this  (Exception: dizziness caused by heat exposure, sudden standing, or poor fluid intake)  Answer Assessment - Initial Assessment Questions 1. DESCRIPTION: "Describe your dizziness."     Lightheaded 2. LIGHTHEADED: "Do you feel lightheaded?" (e.g., somewhat faint, woozy, weak upon standing)     Faint 3. VERTIGO: "Do you feel like either you or the room is spinning or tilting?" (i.e. vertigo)     No 4. SEVERITY: "How bad is it?"  "Do you feel like you are going to faint?" "Can you stand and walk?"   - MILD - walking normally   - MODERATE - interferes with normal activities (e.g., work, school)    - SEVERE - unable to stand, requires support to walk, feels like passing out now.      Moderate 5. ONSET:  "When did the dizziness begin?"     2 weeks ago 6. AGGRAVATING FACTORS: "Does anything make it worse?" (e.g., standing, change in head position)     Change position - getting new eye glasses 7. HEART RATE: "Can you tell me your heart rate?" "How many beats in 15 seconds?"  (Note: not all patients can do this)       Heart rate "is good" 8. CAUSE: "What do you think is causing the dizziness?"     "I JUST FEEL CRAPPY." 9. RECURRENT SYMPTOM: "Have you had dizziness before?" If so, ask: "When was the last time?" "What happened that time?"     yES - 5 Years ago 10. OTHER SYMPTOMS: "Do you have any other symptoms?" (e.g., fever, chest pain, vomiting, diarrhea, bleeding)       Headache - "a little nauseas"  11.  PREGNANCY: "Is there any chance you are pregnant?" "When was your last menstrual period?"       No  Protocols used: DIZZINESS [facility]

## 2017-11-03 NOTE — Telephone Encounter (Signed)
I will see her then Please stress importance of good fluid intake

## 2017-11-04 ENCOUNTER — Ambulatory Visit: Payer: BLUE CROSS/BLUE SHIELD | Admitting: Family Medicine

## 2017-11-04 ENCOUNTER — Encounter: Payer: Self-pay | Admitting: Family Medicine

## 2017-11-04 VITALS — BP 118/76 | HR 91 | Temp 98.7°F | Ht 62.75 in | Wt 179.5 lb

## 2017-11-04 DIAGNOSIS — R51 Headache: Secondary | ICD-10-CM

## 2017-11-04 DIAGNOSIS — I10 Essential (primary) hypertension: Secondary | ICD-10-CM | POA: Diagnosis not present

## 2017-11-04 DIAGNOSIS — C50812 Malignant neoplasm of overlapping sites of left female breast: Secondary | ICD-10-CM | POA: Diagnosis not present

## 2017-11-04 DIAGNOSIS — R5383 Other fatigue: Secondary | ICD-10-CM | POA: Insufficient documentation

## 2017-11-04 DIAGNOSIS — R519 Headache, unspecified: Secondary | ICD-10-CM

## 2017-11-04 DIAGNOSIS — R7309 Other abnormal glucose: Secondary | ICD-10-CM

## 2017-11-04 DIAGNOSIS — R5382 Chronic fatigue, unspecified: Secondary | ICD-10-CM

## 2017-11-04 DIAGNOSIS — F17200 Nicotine dependence, unspecified, uncomplicated: Secondary | ICD-10-CM | POA: Diagnosis not present

## 2017-11-04 DIAGNOSIS — R42 Dizziness and giddiness: Secondary | ICD-10-CM

## 2017-11-04 DIAGNOSIS — Z170 Estrogen receptor positive status [ER+]: Secondary | ICD-10-CM

## 2017-11-04 DIAGNOSIS — H539 Unspecified visual disturbance: Secondary | ICD-10-CM

## 2017-11-04 NOTE — Assessment & Plan Note (Signed)
Suspect due to lack of sleep/ headache and stressors Nl exam Long disc re: changes to make in lifestyle If no imp will get labs

## 2017-11-04 NOTE — Assessment & Plan Note (Signed)
bp in fair control at this time  BP Readings from Last 1 Encounters:  11/04/17 118/76   No changes needed Disc lifstyle change with low sodium diet and exercise  No medicines currently  Not orthostatic

## 2017-11-04 NOTE — Assessment & Plan Note (Signed)
Pt is on year 5 of arimidex at this time Overall doing well

## 2017-11-04 NOTE — Patient Instructions (Addendum)
We need to get you in a regular sleep schedule  When able - go to bed 9:30 if you get up at 5 with no napping  Adjust if necessary to get 8 hours in bed per night  Even on days you do not work -keep this the same   Regular caffeine causes migraines (but during a head ache it helps)  Start weaning by 1 coffee per week   Have a conversation with friend and set a cut off date for use of your resources  Also talk to kids about the kitchen situation   You need to replace junk food with fruits and vegetables and healthy protein  Try to get most of your carbohydrates from produce (with the exception of white potatoes)  Eat less bread/pasta/rice/snack foods/cereals/sweets and other items from the middle of the grocery store (processed carbs)  Avoid red meat/ fried foods/ egg yolks/ fatty breakfast meats/ butter, cheese and high fat dairy/ and shellfish    Get 64 oz of fluids per day -mostly water   Get your new glasses !   Start taking better care of yourself  Keep thinking about quitting smoking   If the above measures do not help-we can consider and imaging study for headaches  If fatigue does not improve in a month- let's get some labs (call for appt.)
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
## 2017-11-04 NOTE — Assessment & Plan Note (Signed)
Disc imp of lower glycemic diet

## 2017-11-04 NOTE — Assessment & Plan Note (Signed)
Adding to HA Gets new glasses soon /bifocal  Alert if this does not help

## 2017-11-04 NOTE — Assessment & Plan Note (Signed)
Suspect this is multifactorial and needs lifestyle change /also needs glasses She will get her new glasses soon/disc eye strain Work on getting 8 hours of sleep a night -goal of same bed and wake time (tricky as hours at work change daily) Inc fluid to 64 oz day/water Gradually cut caffeine by 1 serving per week- (she drinks a pot of coffee per day) Eat better- cut out junk/fast food Exercise /has elliptical machine Work on stressors (counselor if necessary)  Work on quitting smoking Reassuring exam Remote hx of cva-if no imp will get imaging >25 minutes spent in face to face time with patient, >50% spent in counselling or coordination of care -incl disc of self care and diet Handout given

## 2017-11-04 NOTE — Assessment & Plan Note (Signed)
Disc in detail risks of smoking and possible outcomes including copd, vascular/ heart disease, cancer , respiratory and sinus infections  Pt voices understanding  

## 2017-11-04 NOTE — Assessment & Plan Note (Signed)
occ mild light headed Nl exam  Not orthostatic

## 2017-11-30 ENCOUNTER — Other Ambulatory Visit: Payer: Self-pay | Admitting: Internal Medicine

## 2017-12-25 ENCOUNTER — Ambulatory Visit
Admission: RE | Admit: 2017-12-25 | Discharge: 2017-12-25 | Disposition: A | Discharge status: Home / Self Care | Payer: BLUE CROSS/BLUE SHIELD | Source: Ambulatory Visit | Attending: Internal Medicine | Admitting: Internal Medicine

## 2017-12-25 DIAGNOSIS — Z170 Estrogen receptor positive status [ER+]: Secondary | ICD-10-CM | POA: Insufficient documentation

## 2017-12-25 DIAGNOSIS — C50812 Malignant neoplasm of overlapping sites of left female breast: Secondary | ICD-10-CM

## 2017-12-25 DIAGNOSIS — R928 Other abnormal and inconclusive findings on diagnostic imaging of breast: Secondary | ICD-10-CM | POA: Diagnosis not present

## 2018-01-26 ENCOUNTER — Telehealth: Payer: Self-pay | Admitting: Family Medicine

## 2018-01-26 DIAGNOSIS — E7800 Pure hypercholesterolemia, unspecified: Secondary | ICD-10-CM

## 2018-01-26 DIAGNOSIS — Z862 Personal history of diseases of the blood and blood-forming organs and certain disorders involving the immune mechanism: Secondary | ICD-10-CM

## 2018-01-26 DIAGNOSIS — I10 Essential (primary) hypertension: Secondary | ICD-10-CM

## 2018-01-26 NOTE — Telephone Encounter (Signed)
-----   Message from Lendon Collar, RT sent at 01/20/2018 10:04 AM EDT ----- Regarding: Lab orders for Tuesday, June 4th Please enter CPE lab orders for Tuesday June 4th. Thanks-Lauren

## 2018-01-27 ENCOUNTER — Other Ambulatory Visit (INDEPENDENT_AMBULATORY_CARE_PROVIDER_SITE_OTHER): Payer: BLUE CROSS/BLUE SHIELD

## 2018-01-27 DIAGNOSIS — E7800 Pure hypercholesterolemia, unspecified: Secondary | ICD-10-CM | POA: Diagnosis not present

## 2018-01-27 DIAGNOSIS — I10 Essential (primary) hypertension: Secondary | ICD-10-CM | POA: Diagnosis not present

## 2018-01-27 DIAGNOSIS — Z862 Personal history of diseases of the blood and blood-forming organs and certain disorders involving the immune mechanism: Secondary | ICD-10-CM

## 2018-01-27 LAB — COMPREHENSIVE METABOLIC PANEL
ALBUMIN: 3.8 g/dL (ref 3.5–5.2)
ALT: 30 U/L (ref 0–35)
AST: 21 U/L (ref 0–37)
Alkaline Phosphatase: 79 U/L (ref 39–117)
BUN: 15 mg/dL (ref 6–23)
CALCIUM: 9.2 mg/dL (ref 8.4–10.5)
CHLORIDE: 107 meq/L (ref 96–112)
CO2: 28 mEq/L (ref 19–32)
Creatinine, Ser: 0.6 mg/dL (ref 0.40–1.20)
GFR: 109.39 mL/min (ref >60.00)
Glucose, Bld: 111 mg/dL — ABNORMAL HIGH (ref 70–99)
POTASSIUM: 4 meq/L (ref 3.5–5.1)
SODIUM: 142 meq/L (ref 135–145)
Total Bilirubin: 0.5 mg/dL (ref 0.2–1.2)
Total Protein: 5.9 g/dL — ABNORMAL LOW (ref 6.0–8.3)

## 2018-01-27 LAB — CBC WITH DIFFERENTIAL/PLATELET
BASOS PCT: 0.4 % (ref 0.0–3.0)
Basophils Absolute: 0 10*3/uL (ref 0.0–0.1)
EOS PCT: 1.4 % (ref 0.0–5.0)
Eosinophils Absolute: 0.1 10*3/uL (ref 0.0–0.7)
HEMATOCRIT: 40.8 % (ref 36.0–46.0)
HEMOGLOBIN: 14.1 g/dL (ref 12.0–15.0)
LYMPHS PCT: 30.8 % (ref 12.0–46.0)
Lymphs Abs: 2.8 10*3/uL (ref 0.7–4.0)
MCHC: 34.7 g/dL (ref 30.0–36.0)
MCV: 96.1 fl (ref 78.0–100.0)
Monocytes Absolute: 0.8 10*3/uL (ref 0.1–1.0)
Monocytes Relative: 8.3 % (ref 3.0–12.0)
Neutro Abs: 5.3 10*3/uL (ref 1.4–7.7)
Neutrophils Relative %: 59.1 % (ref 43.0–77.0)
Platelets: 253 10*3/uL (ref 150.0–400.0)
RBC: 4.24 Mil/uL (ref 3.87–5.11)
RDW: 12.8 % (ref 11.5–15.5)
WBC: 9 10*3/uL (ref 4.0–10.5)

## 2018-01-27 LAB — LIPID PANEL
CHOLESTEROL: 123 mg/dL (ref 0–200)
HDL: 40 mg/dL (ref >39.00)
LDL CALC: 68 mg/dL (ref 0–99)
NonHDL: 83.15
Total CHOL/HDL Ratio: 3
Triglycerides: 74 mg/dL (ref 0.0–149.0)
VLDL: 14.8 mg/dL (ref 0.0–40.0)

## 2018-01-27 LAB — TSH: TSH: 2.03 u[IU]/mL (ref 0.35–4.50)

## 2018-02-03 ENCOUNTER — Ambulatory Visit (INDEPENDENT_AMBULATORY_CARE_PROVIDER_SITE_OTHER): Payer: BLUE CROSS/BLUE SHIELD | Admitting: Family Medicine

## 2018-02-03 ENCOUNTER — Encounter: Payer: Self-pay | Admitting: Family Medicine

## 2018-02-03 ENCOUNTER — Other Ambulatory Visit (HOSPITAL_COMMUNITY)
Admission: RE | Admit: 2018-02-03 | Discharge: 2018-02-03 | Disposition: A | Discharge status: Home / Self Care | Payer: BLUE CROSS/BLUE SHIELD | Source: Ambulatory Visit | Attending: Family Medicine | Admitting: Family Medicine

## 2018-02-03 VITALS — BP 134/84 | HR 113 | Temp 98.2°F | Ht 62.25 in | Wt 174.5 lb

## 2018-02-03 DIAGNOSIS — C50812 Malignant neoplasm of overlapping sites of left female breast: Secondary | ICD-10-CM | POA: Diagnosis not present

## 2018-02-03 DIAGNOSIS — Z170 Estrogen receptor positive status [ER+]: Secondary | ICD-10-CM

## 2018-02-03 DIAGNOSIS — Z01419 Encounter for gynecological examination (general) (routine) without abnormal findings: Secondary | ICD-10-CM | POA: Insufficient documentation

## 2018-02-03 DIAGNOSIS — R7303 Prediabetes: Secondary | ICD-10-CM | POA: Insufficient documentation

## 2018-02-03 DIAGNOSIS — E7800 Pure hypercholesterolemia, unspecified: Secondary | ICD-10-CM | POA: Diagnosis not present

## 2018-02-03 DIAGNOSIS — F17200 Nicotine dependence, unspecified, uncomplicated: Secondary | ICD-10-CM

## 2018-02-03 DIAGNOSIS — I10 Essential (primary) hypertension: Secondary | ICD-10-CM | POA: Diagnosis not present

## 2018-02-03 DIAGNOSIS — Z0000 Encounter for general adult medical examination without abnormal findings: Secondary | ICD-10-CM

## 2018-02-03 MED ORDER — ATORVASTATIN CALCIUM 10 MG PO TABS: 10.0000 mg | ORAL_TABLET | Freq: Every day | ORAL | 3 refills | Status: DC

## 2018-02-03 MED ORDER — VARENICLINE TARTRATE 0.5 MG X 11 & 1 MG X 42 PO MISC: ORAL | 0 refills | Status: DC

## 2018-02-03 MED ORDER — VARENICLINE TARTRATE 1 MG PO TABS: 1.0000 mg | ORAL_TABLET | Freq: Two times a day (BID) | ORAL | 3 refills | Status: DC

## 2018-02-03 NOTE — Progress Notes (Signed)
Subjective:    Patient ID: Amy Leonard, female    DOB: Jul 21, 1961, 57 y.o.   MRN: [id number]  HPI Here for health maintenance exam and to review chronic medical problems    Working a lot  Will take some time off this summer  Got a dog 3 weeks ago - now has a lot of animals  Needs to start walking   Wt Readings from Last 3 Encounters:  02/03/18 174 lb 8 oz (79.2 kg)  11/04/17 179 lb 8 oz (81.4 kg)  07/30/17 178 lb 3.9 oz (80.8 kg)  needs to start  Eating fair - too much fast food and too much bread 31.66 kg/m   Pap 6/16-nl with neg hpv Due for a pap -no gyn c/o  On arimidex   Flu shot 12/18 Tetanus shot 1/14  Mammogram 5/19 -neg  Personal hx of breast cancer  Self breast exam  arimidex - may get to stop it in December (tolerates it fairly)  5 y cancer free   Colonoscopy 2/14 -10 y recall   Zoster status - never had a case Interested in shingrix   dexa 4/18 Mild osteopenia in fn  Falls- none  Fractures -none  Supplements - takes D and ca when she remembers   Smoking status - still smoking/ quit with chantix in the past/wants to try it again  Some nasal and chest congestion   bp is stable today  No cp or palpitations or headaches or edema  No side effects to medicines  BP Readings from Last 3 Encounters:  02/03/18 134/84  11/04/17 118/76  07/30/17 138/82     Hyperlipidemia Lab Results  Component Value Date   CHOL 123 01/27/2018   CHOL 130 07/22/2017   CHOL 128 01/24/2017   Lab Results  Component Value Date   HDL 40.00 01/27/2018   HDL 45.50 07/22/2017   HDL 48.60 01/24/2017   Lab Results  Component Value Date   LDLCALC 68 01/27/2018   LDLCALC 74 07/22/2017   LDLCALC 65 01/24/2017   Lab Results  Component Value Date   TRIG 74.0 01/27/2018   TRIG 53.0 07/22/2017   TRIG 70.0 01/24/2017   Lab Results  Component Value Date   CHOLHDL 3 01/27/2018   CHOLHDL 3 07/22/2017   CHOLHDL 3 01/24/2017   Lab Results  Component Value Date   LDLDIRECT 130.3 01/26/2010  atorvastatin and diet   Glucose  Lab Results  Component Value Date   HGBA1C 5.9 07/22/2017  glucose was 111   Lab Results  Component Value Date   WBC 9.0 01/27/2018   HGB 14.1 01/27/2018   HCT 40.8 01/27/2018   MCV 96.1 01/27/2018   PLT 253.0 01/27/2018    Lab Results  Component Value Date   TSH 2.03 01/27/2018    Lab Results  Component Value Date   CREATININE 0.60 01/27/2018   BUN 15 01/27/2018   NA 142 01/27/2018   K 4.0 01/27/2018   CL 107 01/27/2018   CO2 28 01/27/2018   Lab Results  Component Value Date   ALT 30 01/27/2018   AST 21 01/27/2018   ALKPHOS 79 01/27/2018   BILITOT 0.5 01/27/2018     Patient Active Problem List   Diagnosis Date Noted  . Prediabetes 02/03/2018  . Elevated glucose 07/14/2017  . Carcinoma of overlapping sites of left breast in female, estrogen receptor positive ([place]) 07/29/2016  . Dizzy 02/12/2016  . Lymphedema 10/23/2015  . Carpal tunnel syndrome 10/23/2015  .  Encounter for routine gynecological examination 02/10/2015  . Routine general medical examination at a health care facility 02/07/2015  . Degenerative disc disease, lumbar 02/04/2014  . Anterolisthesis 02/04/2014  . Low back pain 02/02/2014  . Postmenopausal estrogen deficiency 09/02/2013  . History of thrombocytopenia 01/06/2013  . Lacunar infarction ([place]) 04/17/2012  . History of CVA (cerebrovascular accident) 04/17/2012  . Pure hypercholesterolemia 04/05/2008  . Essential hypertension 03/30/2008  . ANXIETY DEPRESSION 11/17/2007  . TOBACCO USE 11/17/2007  . BACK PAIN, CHRONIC 09/23/2007   Past Medical History:  Diagnosis Date  . Anxiety   . Anxiety and depression   . Barrett's esophageal ulceration   . Basal ganglia infarction ([place]) 04/17/2012  . Breast cancer ([place]) 2014   BILATERAL LUMPECTOMY  . Carpal tunnel syndrome   . Chronic back pain    spinal stenosis  . Depression   . Elevated WBC count    nl diff  . Erosive gastritis    . Folliculitis 08/31/1094  . GERD (gastroesophageal reflux disease)   . Glaucoma    ?  Marland Kitchen History of chemotherapy 2014   BREAST CA  . HLD (hyperlipidemia)   . HSV infection    recurrent (side and buttox)  . Hypertension   . Migraine   . Personal history of chemotherapy 2014   bilateral breast ca  . Personal history of radiation therapy 2014   bilateral breast ca  . Radiation 2014   BREAST CA  . Tobacco abuse   . Wears dentures    top   Past Surgical History:  Procedure Laterality Date  . 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 BREAST LUMPECTOMY Bilateral 2014   bilateral breast ca  . BTL    . COLONOSCOPY    . epidural steroid injection  2008  . LUMBAR FUSION  03/2016   L4-5  . PARTIAL MASTECTOMY WITH NEEDLE LOCALIZATION AND AXILLARY SENTINEL LYMPH NODE BX Bilateral 11/23/2012   Procedure: PARTIAL MASTECTOMY WITH NEEDLE LOCALIZATION AND AXILLARY SENTINEL LYMPH NODE BX;  Surgeon: Adin Hector, MD;  Location: [facility];  Service: General;  Laterality: Bilateral;  . PORT-A-CATH REMOVAL Right 08/25/2013   Procedure: REMOVAL PORT-A-CATH;  Surgeon: Adin Hector, MD;  Location: [facility];  Service: General;  Laterality: Right;  . PORTACATH PLACEMENT Right 11/23/2012   Procedure: INSERTION PORT-A-CATH;  Surgeon: Adin Hector, MD;  Location: [facility];  Service: General;  Laterality: Right;  . UPPER GI ENDOSCOPY     Social History   Tobacco Use  . Smoking status: Current Every Day Smoker    Packs/day: 0.50    Years: 33.00    Pack years: 16.50    Types: Cigarettes  . Smokeless tobacco: Never Used  Substance Use Topics  . Alcohol use: Yes    Alcohol/week: 0.0 oz    Comment: beer rarely  . Drug use: No   Family History  Problem Relation Age of Onset  . Stroke Maternal Grandmother   . Diabetes Maternal Grandmother   . Diabetes Mother   . Hypertension Mother   . Leukemia  Mother   . Obesity Mother   . Depression Sister   . Bipolar disorder Son        Bipolar / oppositional defiant  . Alcohol abuse Sister   . Drug abuse Sister   . Alcohol abuse Sister   . Drug abuse Sister   . Alcohol abuse Brother   .  Drug abuse Brother   . Pancreatic cancer Brother   . Colon cancer Neg Hx   . Breast cancer Neg Hx    Allergies  Allergen Reactions  . Gabapentin Swelling  . Lyrica [Pregabalin] Other (See Comments)    sedated  . Bupropion Hcl Nausea Only and Other (See Comments)     GI, sleepy  . Chlorhexidine Gluconate Itching and Rash  . Hydrocod Polst-Cpm Polst Er Nausea And Vomiting    vomiting  . Lisinopril Other (See Comments)    Leg cramps    . Naproxen Sodium Other (See Comments)    does not tolerate   Current Outpatient Medications on File Prior to Visit  Medication Sig Dispense Refill  . Acetaminophen (TYLENOL ARTHRITIS PAIN PO) Take 2 tablets by mouth as needed (pain).     Marland Kitchen anastrozole (ARIMIDEX) 1 MG tablet TAKE 1 TABLET DAILY 90 tablet 1  . aspirin 81 MG chewable tablet Chew 1 tablet (81 mg total) by mouth daily. 90 tablet 3  . b complex vitamins tablet Take 1 tablet by mouth daily.    . calcium carbonate (TUMS - DOSED IN MG ELEMENTAL CALCIUM) 500 MG chewable tablet Chew by mouth as directed.      . Calcium Carbonate-Vitamin D (CALCIUM + D PO) Take 1 tablet by mouth daily.    Marland Kitchen omeprazole (PRILOSEC) 40 MG capsule Take 1 capsule (40 mg total) by mouth daily. 90 capsule 0  . ranitidine (ZANTAC) 300 MG tablet Take 150 mg by mouth at bedtime.      No current facility-administered medications on file prior to visit.     Review of Systems  Constitutional: Negative for activity change, appetite change, fatigue, fever and unexpected weight change.  HENT: Negative for congestion, ear pain, rhinorrhea, sinus pressure and sore throat.   Eyes: Negative for pain, redness and visual disturbance.  Respiratory: Negative for cough, shortness of breath and  wheezing.   Cardiovascular: Negative for chest pain and palpitations.  Gastrointestinal: Negative for abdominal pain, blood in stool, constipation and diarrhea.  Endocrine: Negative for polydipsia and polyuria.  Genitourinary: Negative for dysuria, frequency and urgency.  Musculoskeletal: Negative for arthralgias, back pain and myalgias.  Skin: Negative for pallor and rash.  Allergic/Immunologic: Negative for environmental allergies.  Neurological: Negative for dizziness, syncope and headaches.  Hematological: Negative for adenopathy. Does not bruise/bleed easily.  Psychiatric/Behavioral: Negative for decreased concentration and dysphoric mood. The patient is not nervous/anxious.        Objective:   Physical Exam  Constitutional: She appears well-developed and well-nourished. No distress.  obese and well appearing   HENT:  Head: Normocephalic and atraumatic.  Right Ear: External ear normal.  Left Ear: External ear normal.  Mouth/Throat: Oropharynx is clear and moist.  Eyes: Pupils are equal, round, and reactive to light. Conjunctivae and EOM are normal. No scleral icterus.  Neck: Normal range of motion. Neck supple. No JVD present. Carotid bruit is not present. No thyromegaly present.  Cardiovascular: Normal rate, regular rhythm, normal heart sounds and intact distal pulses. Exam reveals no gallop.  Pulmonary/Chest: Effort normal and breath sounds normal. No respiratory distress. She has no wheezes. She exhibits no tenderness. No breast tenderness, discharge or bleeding.  Diffusely distant bs  No wheezing  Abdominal: Soft. Bowel sounds are normal. She exhibits no distension, no abdominal bruit and no mass. There is no tenderness.  Genitourinary: No breast tenderness, discharge or bleeding.  Genitourinary Comments: Breast exam: No mass, nodules, thickening, tenderness, bulging, retraction, inflamation,  nipple discharge or skin changes noted.  No axillary or clavicular LA.    (baseline  scarring on L breast from surgery/ as well as arm lymphedema)           Anus appears normal w/o hemorrhoids or masses     External genitalia : nl appearance and hair distribution/no lesions     Urethral meatus : nl size, no lesions or prolapse     Urethra: no masses, tenderness or scarring    Bladder : no masses or tenderness     Vagina: nl general appearance, no discharge or  Lesions, no significant cystocele  or rectocele     Cervix: no lesions/ discharge or friability    Uterus: nl size, contour, position, and mobility (not fixed) , non tender    Adnexa : no masses, tenderness, enlargement or nodularity        Musculoskeletal: Normal range of motion. She exhibits no edema or tenderness.  Lymphadenopathy:    She has no cervical adenopathy.  Neurological: She is alert. She displays normal reflexes. No cranial nerve deficit. She exhibits normal muscle tone. Coordination normal.  Skin: Skin is warm and dry. Capillary refill takes less than 2 seconds. No rash noted. No erythema. No pallor.  Solar lentigines diffusely   Psychiatric: She has a normal mood and affect.  Pleasant           Assessment & Plan:   Problem List Items Addressed This Visit      Cardiovascular and Mediastinum   Essential hypertension (Chronic)    bp in fair control at this time  BP Readings from Last 1 Encounters:  02/03/18 134/84   No changes needed Most recent labs reviewed  Disc lifstyle change with low sodium diet and exercise        Relevant Medications   atorvastatin (LIPITOR) 10 MG tablet     Other   Carcinoma of overlapping sites of left breast in female, estrogen receptor positive ([place])    5 years cancer free Hopes to come off arimidex in the fall Rev last mammogram       Encounter for routine gynecological examination    Exam and pap done No c/o  Does not desire std screen  On arimidex       Relevant Orders   Cytology - PAP   Prediabetes    Lab  Results  Component Value Date   HGBA1C 5.9 07/22/2017   disc imp of low glycemic diet and wt loss to prevent DM2       Pure hypercholesterolemia    Disc goals for lipids and reasons to control them Rev last labs with pt Rev low sat fat diet in detail  Controlled with atorvastatin and diet       Relevant Medications   atorvastatin (LIPITOR) 10 MG tablet   Routine general medical examination at a health care facility - Primary    Reviewed health habits including diet and exercise and skin cancer prevention Reviewed appropriate screening tests for age  Also reviewed health mt list, fam hx and immunization status , as well as social and family history   See HPI Las reviewed  Disc smoking cessation-plans to try chantix  Disc plan for better diet and exercise  Disc getting on wait list for shingrix vaccine        TOBACCO USE    Disc in detail risks of smoking and possible outcomes including copd, vascular/ heart disease, cancer , respiratory and sinus infections  Pt  voices understanding Plans to try chantix-px given  Poss side eff discussed/ she has tolerated it in the past

## 2018-02-03 NOTE — Assessment & Plan Note (Signed)
Disc goals for lipids and reasons to control them Rev last labs with pt Rev low sat fat diet in detail  Controlled with atorvastatin and diet 

## 2018-02-03 NOTE — Assessment & Plan Note (Signed)
Lab Results  Component Value Date   HGBA1C 5.9 07/22/2017   disc imp of low glycemic diet and wt loss to prevent DM2

## 2018-02-03 NOTE — Assessment & Plan Note (Signed)
Exam and pap done No c/o  Does not desire std screen  On arimidex

## 2018-02-03 NOTE — Assessment & Plan Note (Signed)
Disc in detail risks of smoking and possible outcomes including copd, vascular/ heart disease, cancer , respiratory and sinus infections  Pt voices understanding Plans to try chantix-px given  Poss side eff discussed/ she has tolerated it in the past

## 2018-02-03 NOTE — Patient Instructions (Addendum)
Start walking with the dog 30 minutes to start-then build it up from there    To loose weight and prevent diabetes  Try to get most of your carbohydrates from produce (with the exception of white potatoes)  Eat less bread/pasta/rice/snack foods/cereals/sweets and other items from the middle of the grocery store (processed carbs)   If you are interested in the new shingles vaccine (Shingrix) - call your local pharmacy to check on coverage and availability - if affordable get on a waiting list at your pharmacy   Try chantix to quit smoking   Get a days of the week pill box - fill it weekly and include your caltrate

## 2018-02-03 NOTE — Assessment & Plan Note (Signed)
bp in fair control at this time  BP Readings from Last 1 Encounters:  02/03/18 134/84   No changes needed Most recent labs reviewed  Disc lifstyle change with low sodium diet and exercise

## 2018-02-03 NOTE — Assessment & Plan Note (Signed)
5 years cancer free Hopes to come off arimidex in the fall Rev last mammogram

## 2018-02-03 NOTE — Assessment & Plan Note (Signed)
Reviewed health habits including diet and exercise and skin cancer prevention Reviewed appropriate screening tests for age  Also reviewed health mt list, fam hx and immunization status , as well as social and family history   See HPI Las reviewed  Disc smoking cessation-plans to try chantix  Disc plan for better diet and exercise  Disc getting on wait list for shingrix vaccine

## 2018-02-05 LAB — CYTOLOGY - PAP
Diagnosis: NEGATIVE
HPV: NOT DETECTED

## 2018-04-17 DIAGNOSIS — M79671 Pain in right foot: Secondary | ICD-10-CM | POA: Diagnosis not present

## 2018-04-17 DIAGNOSIS — M722 Plantar fascial fibromatosis: Secondary | ICD-10-CM | POA: Diagnosis not present

## 2018-07-30 ENCOUNTER — Ambulatory Visit: Payer: BLUE CROSS/BLUE SHIELD | Admitting: Internal Medicine

## 2018-08-05 ENCOUNTER — Ambulatory Visit
Admission: RE | Admit: 2018-08-05 | Discharge: 2018-08-05 | Disposition: A | Discharge status: Home / Self Care | Payer: BLUE CROSS/BLUE SHIELD | Source: Ambulatory Visit | Attending: Radiation Oncology | Admitting: Radiation Oncology

## 2018-08-05 ENCOUNTER — Encounter: Payer: Self-pay | Admitting: Internal Medicine

## 2018-08-05 ENCOUNTER — Other Ambulatory Visit: Payer: Self-pay

## 2018-08-05 ENCOUNTER — Telehealth: Payer: Self-pay | Admitting: Internal Medicine

## 2018-08-05 ENCOUNTER — Encounter: Payer: Self-pay | Admitting: Radiation Oncology

## 2018-08-05 ENCOUNTER — Inpatient Hospital Stay: Payer: BLUE CROSS/BLUE SHIELD | Attending: Internal Medicine | Admitting: Internal Medicine

## 2018-08-05 VITALS — BP 129/84 | HR 87 | Temp 97.8°F | Resp 16 | Wt 176.0 lb

## 2018-08-05 DIAGNOSIS — G8929 Other chronic pain: Secondary | ICD-10-CM | POA: Insufficient documentation

## 2018-08-05 DIAGNOSIS — C50812 Malignant neoplasm of overlapping sites of left female breast: Secondary | ICD-10-CM | POA: Insufficient documentation

## 2018-08-05 DIAGNOSIS — C50912 Malignant neoplasm of unspecified site of left female breast: Secondary | ICD-10-CM | POA: Insufficient documentation

## 2018-08-05 DIAGNOSIS — I10 Essential (primary) hypertension: Secondary | ICD-10-CM | POA: Diagnosis not present

## 2018-08-05 DIAGNOSIS — C50911 Malignant neoplasm of unspecified site of right female breast: Secondary | ICD-10-CM

## 2018-08-05 DIAGNOSIS — F17210 Nicotine dependence, cigarettes, uncomplicated: Secondary | ICD-10-CM | POA: Diagnosis not present

## 2018-08-05 DIAGNOSIS — Z170 Estrogen receptor positive status [ER+]: Secondary | ICD-10-CM | POA: Diagnosis not present

## 2018-08-05 DIAGNOSIS — Z923 Personal history of irradiation: Secondary | ICD-10-CM | POA: Diagnosis not present

## 2018-08-05 DIAGNOSIS — F329 Major depressive disorder, single episode, unspecified: Secondary | ICD-10-CM | POA: Diagnosis not present

## 2018-08-05 DIAGNOSIS — Z7982 Long term (current) use of aspirin: Secondary | ICD-10-CM | POA: Diagnosis not present

## 2018-08-05 DIAGNOSIS — Z79899 Other long term (current) drug therapy: Secondary | ICD-10-CM | POA: Insufficient documentation

## 2018-08-05 DIAGNOSIS — M8580 Other specified disorders of bone density and structure, unspecified site: Secondary | ICD-10-CM | POA: Diagnosis not present

## 2018-08-05 DIAGNOSIS — Z79811 Long term (current) use of aromatase inhibitors: Secondary | ICD-10-CM | POA: Diagnosis not present

## 2018-08-05 DIAGNOSIS — Z171 Estrogen receptor negative status [ER-]: Secondary | ICD-10-CM | POA: Diagnosis not present

## 2018-08-05 NOTE — Telephone Encounter (Signed)
MD in 1 year (BONE DENSITY to be done prior/ per Crystal @ [facility], schd only goes out 6 mths. Heather/Dr. B & patient advised.)  Patient will call [facility] in June to schd Bone Density.

## 2018-08-05 NOTE — Progress Notes (Signed)
[place] OFFICE PROGRESS NOTE  Patient Care Team: Tower, Wynelle Fanny, MD as PCP - General  Cancer of breast [facility])   Staging form: Breast, AJCC 7th Edition     Clinical: Stage IA (T1c, N0, M0) - Unsigned    Oncology History   # 2014- SYNCHRONOUS BIL BREAST CA [Dr.K. Humphrey Rolls; [place]]  # RIGHT-IMC; lumpec & ALND-T1cN0; TNBC s/p RT  # LEFT-IMC;STAGE II  T1bN1 ER/PR-Pos Her 2 Neu-NEG s/p Lumpec & RT  # s/p AC x4- Taxol/ gem-Carbo  # MARCH 2015- STARTED Arimidex   # BMD- 2018- osteopenia  # BRCA testing- NEG [as per pt]  # PN- 1-2; intolerant to Neurontin/Lyrica; s/p accupuncture  # DIAGNOSIS: Breast cancer synchronous #Right side stage I triple negative #Left side-stage II ER PR positive HER-2/neu negative  GOALS: Cure  CURRENT/MOST RECENT THERAPY: Anastrozole [x Ettended]      Carcinoma of overlapping sites of left breast in female, estrogen receptor positive ([place])    INTERVAL HISTORY:  Amy Leonard 57 y.o.  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 She had a recent mammogram/DEXA scan.   Patient denies any new lumps or bumps. Denies any headaches. Denies any unusual cough or chest pain or weight loss.  Occasional hot flashes.  Review of Systems  Constitutional: Negative for chills, diaphoresis, fever, malaise/fatigue and weight loss.  HENT: Negative for nosebleeds and sore throat.   Eyes: Negative for double vision.  Respiratory: Negative for cough, hemoptysis, sputum production, shortness of breath and wheezing.   Cardiovascular: Negative for chest pain, palpitations, orthopnea and leg swelling.  Gastrointestinal: Negative for abdominal pain, blood in stool, constipation, diarrhea, heartburn, melena, nausea and vomiting.  Genitourinary: Negative for dysuria, frequency and urgency.  Musculoskeletal: Negative for back pain and joint pain.  Skin: Negative.  Negative for  itching and rash.  Neurological: Negative for dizziness, tingling, focal weakness, weakness and headaches.  Endo/Heme/Allergies: Does not bruise/bleed easily.  Psychiatric/Behavioral: Negative for depression. The patient is not nervous/anxious and does not have insomnia.      PAST MEDICAL HISTORY :  Past Medical History:  Diagnosis Date  . Anxiety   . Anxiety and depression   . Barrett's esophageal ulceration   . 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 Breast cancer ([place]) 2014   BILATERAL LUMPECTOMY  . Carpal tunnel syndrome   . Chronic back pain    spinal stenosis  . Depression   . Elevated WBC count    nl diff  . Erosive gastritis   . Folliculitis 09/03/6220  . GERD (gastroesophageal reflux disease)   . Glaucoma    ?  Marland Kitchen History of chemotherapy 2014   BREAST CA  . HLD (hyperlipidemia)   . HSV infection    recurrent (side and buttox)  . Hypertension   . Migraine   . Personal history of chemotherapy 2014   bilateral breast ca  . Personal history of radiation therapy 2014   bilateral breast ca  . Radiation 2014   BREAST CA  . Tobacco abuse   . Wears dentures    top    PAST SURGICAL HISTORY :   Past Surgical History:  Procedure Laterality Date  . 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 BREAST LUMPECTOMY Bilateral 2014   bilateral breast ca  . BTL    . COLONOSCOPY    . epidural steroid injection  2008  . LUMBAR FUSION  03/2016   L4-5  . PARTIAL MASTECTOMY WITH NEEDLE LOCALIZATION AND AXILLARY SENTINEL LYMPH NODE BX Bilateral 11/23/2012   Procedure: PARTIAL MASTECTOMY WITH NEEDLE LOCALIZATION AND AXILLARY SENTINEL LYMPH NODE BX;  Surgeon: Adin Hector, MD;  Location: [place];  Service: General;  Laterality: Bilateral;  . PORT-A-CATH REMOVAL Right 08/25/2013   Procedure: REMOVAL PORT-A-CATH;  Surgeon: Adin Hector, MD;  Location: [place];  Service: General;  Laterality:  Right;  . PORTACATH PLACEMENT Right 11/23/2012   Procedure: INSERTION PORT-A-CATH;  Surgeon: Adin Hector, MD;  Location: [place];  Service: General;  Laterality: Right;  . UPPER GI ENDOSCOPY      FAMILY HISTORY :   Family History  Problem Relation Age of Onset  . Stroke Maternal Grandmother   . Diabetes Maternal Grandmother   . Diabetes Mother   . Hypertension Mother   . Leukemia Mother   . Obesity Mother   . Depression Sister   . Bipolar disorder Son        Bipolar / oppositional defiant  . Alcohol abuse Sister   . Drug abuse Sister   . Alcohol abuse Sister   . Drug abuse Sister   . Alcohol abuse Brother   . Drug abuse Brother   . Pancreatic cancer Brother   . Colon cancer Neg Hx   . Breast cancer Neg Hx     SOCIAL HISTORY:   Social History   Tobacco Use  . Smoking status: Current Every Day Smoker    Packs/day: 0.50    Years: 33.00    Pack years: 16.50    Types: Cigarettes  . Smokeless tobacco: Never Used  Substance Use Topics  . Alcohol use: Yes    Alcohol/week: 0.0 standard drinks    Comment: beer rarely  . Drug use: No    ALLERGIES:  is allergic to gabapentin; lyrica [pregabalin]; bupropion hcl; chlorhexidine gluconate; hydrocod polst-cpm polst er; lisinopril; and naproxen sodium.  MEDICATIONS:  Current Outpatient Medications  Medication Sig Dispense Refill  . Acetaminophen (TYLENOL ARTHRITIS PAIN PO) Take 2 tablets by mouth as needed (pain).     Marland Kitchen anastrozole (ARIMIDEX) 1 MG tablet TAKE 1 TABLET DAILY 90 tablet 1  . aspirin 81 MG chewable tablet Chew 1 tablet (81 mg total) by mouth daily. 90 tablet 3  . atorvastatin (LIPITOR) 10 MG tablet Take 1 tablet (10 mg total) by mouth daily. 90 tablet 3  . b complex vitamins tablet Take 1 tablet by mouth daily.    . calcium carbonate (TUMS - DOSED IN MG ELEMENTAL CALCIUM) 500 MG chewable tablet Chew by mouth as directed.      . Calcium Carbonate-Vitamin D (CALCIUM + D PO) Take 1 tablet by  mouth daily.    Marland Kitchen omeprazole (PRILOSEC) 40 MG capsule Take 1 capsule (40 mg total) by mouth daily. 90 capsule 0  . ranitidine (ZANTAC) 300 MG tablet Take 150 mg by mouth at bedtime.     . varenicline (CHANTIX PAK) 0.5 MG X 11 & 1 MG X 42 tablet Take one 0.5 mg tablet by mouth once daily for 3 days, then increase to one 0.5 mg tablet twice daily for 4 days, then increase to one 1 mg tablet twice daily. 53 tablet 0  . varenicline (CHANTIX) 1 MG tablet Take 1 tablet (1 mg total) by mouth 2 (two) times daily. 60 tablet 3   No current facility-administered medications for this  visit.     PHYSICAL EXAMINATION: ECOG PERFORMANCE STATUS: 0 - Asymptomatic  BP 129/84   Pulse 87   Temp 97.8 F (36.6 C) (Tympanic)   Resp 16   Wt 176 lb (79.8 kg)   BMI 31.93 kg/m   Filed Weights   08/05/18 1017  Weight: 176 lb (79.8 kg)    Physical Exam  Constitutional: She is oriented to person, place, and time and well-developed, well-nourished, and in no distress.  HENT:  Head: Normocephalic and atraumatic.  Mouth/Throat: Oropharynx is clear and moist. No oropharyngeal exudate.  Eyes: Pupils are equal, round, and reactive to light.  Neck: Normal range of motion. Neck supple.  Cardiovascular: Normal rate and regular rhythm.  Pulmonary/Chest: No respiratory distress. She has no wheezes.  Abdominal: Soft. Bowel sounds are normal. She exhibits no distension and no mass. There is no tenderness. There is no rebound and no guarding.  Musculoskeletal: Normal range of motion. She exhibits no edema or tenderness.  Neurological: She is alert and oriented to person, place, and time.  Skin: Skin is warm.  Right and left BREAST exam (in the presence of nurse)- no unusual skin changes or dominant masses felt. Surgical scars noted.    Psychiatric: Affect normal.     LABORATORY DATA:  I have reviewed the data as listed    Component Value Date/Time   NA 142 01/27/2018 0834   NA 140 07/25/2014 1100   NA 140  11/02/2013 1040   K 4.0 01/27/2018 0834   K 4.1 07/25/2014 1100   K 3.9 11/02/2013 1040   CL 107 01/27/2018 0834   CL 105 07/25/2014 1100   CL 109 (H) 02/17/2013 1120   CO2 28 01/27/2018 0834   CO2 30 07/25/2014 1100   CO2 24 11/02/2013 1040   GLUCOSE 111 (H) 01/27/2018 0834   GLUCOSE 97 07/25/2014 1100   GLUCOSE 103 11/02/2013 1040   GLUCOSE 99 02/17/2013 1120   BUN 15 01/27/2018 0834   BUN 15 07/25/2014 1100   BUN 9.3 11/02/2013 1040   CREATININE 0.60 01/27/2018 0834   CREATININE 0.74 07/25/2014 1100   CREATININE 0.7 11/02/2013 1040   CALCIUM 9.2 01/27/2018 0834   CALCIUM 8.6 07/25/2014 1100   CALCIUM 9.5 11/02/2013 1040   PROT 5.9 (L) 01/27/2018 0834   PROT 6.4 07/25/2014 1100   PROT 6.2 (L) 11/02/2013 1040   ALBUMIN 3.8 01/27/2018 0834   ALBUMIN 3.5 07/25/2014 1100   ALBUMIN 3.7 11/02/2013 1040   AST 21 01/27/2018 0834   AST 16 07/25/2014 1100   AST 20 11/02/2013 1040   ALT 30 01/27/2018 0834   ALT 24 07/25/2014 1100   ALT 22 11/02/2013 1040   ALKPHOS 79 01/27/2018 0834   ALKPHOS 75 07/25/2014 1100   ALKPHOS 84 11/02/2013 1040   BILITOT 0.5 01/27/2018 0834   BILITOT 0.3 07/25/2014 1100   BILITOT 0.48 11/02/2013 1040   GFRNONAA >60 07/29/2016 0914   GFRNONAA >60 07/25/2014 1100   GFRAA >60 07/29/2016 0914   GFRAA >60 07/25/2014 1100    No results found for: SPEP, UPEP  Lab Results  Component Value Date   WBC 9.0 01/27/2018   NEUTROABS 5.3 01/27/2018   HGB 14.1 01/27/2018   HCT 40.8 01/27/2018   MCV 96.1 01/27/2018   PLT 253.0 01/27/2018      Chemistry      Component Value Date/Time   NA 142 01/27/2018 0834   NA 140 07/25/2014 1100   NA 140 11/02/2013 1040  K 4.0 01/27/2018 0834   K 4.1 07/25/2014 1100   K 3.9 11/02/2013 1040   CL 107 01/27/2018 0834   CL 105 07/25/2014 1100   CL 109 (H) 02/17/2013 1120   CO2 28 01/27/2018 0834   CO2 30 07/25/2014 1100   CO2 24 11/02/2013 1040   BUN 15 01/27/2018 0834   BUN 15 07/25/2014 1100   BUN 9.3  11/02/2013 1040   CREATININE 0.60 01/27/2018 0834   CREATININE 0.74 07/25/2014 1100   CREATININE 0.7 11/02/2013 1040      Component Value Date/Time   CALCIUM 9.2 01/27/2018 0834   CALCIUM 8.6 07/25/2014 1100   CALCIUM 9.5 11/02/2013 1040   ALKPHOS 79 01/27/2018 0834   ALKPHOS 75 07/25/2014 1100   ALKPHOS 84 11/02/2013 1040   AST 21 01/27/2018 0834   AST 16 07/25/2014 1100   AST 20 11/02/2013 1040   ALT 30 01/27/2018 0834   ALT 24 07/25/2014 1100   ALT 22 11/02/2013 1040   BILITOT 0.5 01/27/2018 0834   BILITOT 0.3 07/25/2014 1100   BILITOT 0.48 11/02/2013 1040       RADIOGRAPHIC STUDIES: I have personally reviewed the radiological images as listed and agreed with the findings in the report. No results found.   ASSESSMENT & PLAN:  Carcinoma of overlapping sites of left breast in female, estrogen receptor positive ([place]) Bilateral synchronous breast cancer-early stage. Patient currently on adjuvant Arimidex [started 2015]. STABLE.   # No concerns for any recurrence based on physical exam/symptoms. Discussed re: 5 vs 10 years; plan 10 years.  Patient tolerating Arimidex fairly well.  # BMD- June 2018-osteopenia- Ca+ vit.  We will repeat a bone density test at next visit.  #Right hip pain likely from arthritis; stable.  Do not suspect from malignancy or anastrozole.  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  STABLE.   # Smoking- unfortunately, back on smoking; counseled on quitting smoking.  Discussed re: LCSP; interested; will refer.   # DISPOSITION: # follow up in 12 months; BMD prior- Dr.B  Cc; [facility].     Orders Placed This Encounter  Procedures  . DG Bone Density    Standing Status:   Future    Standing Expiration Date:   02/04/2020    Order Specific Question:   Reason for Exam (SYMPTOM  OR DIAGNOSIS REQUIRED)    Answer:   aromatase inhibitor use    Order Specific Question:   Is the patient pregnant?    Answer:   No    Order Specific  Question:   Preferred imaging location?    Answer:   [facility]   All questions were answered. The patient knows to call the clinic with any problems, questions or concerns.      Cammie Sickle, MD 08/05/2018 11:50 AM

## 2018-08-05 NOTE — Assessment & Plan Note (Addendum)
Bilateral synchronous breast cancer-early stage. Patient currently on adjuvant Arimidex [started 2015]. STABLE.   # No concerns for any recurrence based on physical exam/symptoms. Discussed re: 5 vs 10 years; plan 10 years.  Patient tolerating Arimidex fairly well.  # BMD- June 2018-osteopenia- Ca+ vit.  We will repeat a bone density test at next visit.  #Right hip pain likely from arthritis; stable.  Do not suspect from malignancy or anastrozole.  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  STABLE.   # Smoking- unfortunately, back on smoking; counseled on quitting smoking.  Discussed re: LCSP; interested; will refer.   # DISPOSITION: # follow up in 12 months; BMD prior- Dr.B  Cc; [facility].

## 2018-08-05 NOTE — Progress Notes (Signed)
Radiation Oncology Follow up Note  Name: Amy Leonard   Date:   08/05/2018 MRN:  [id number] DOB: 09-Nov-1960    This 57 y.o. female presents to the clinic today for 5 year follow-up status post bilateral breast radiation.  REFERRING PROVIDER: Tower, Wynelle Fanny, MD  HPI: patient is a 57 year old female now out 5 years having completed bilateral breast radiation. She had a T1c triple negative invasive mammary carcinoma of the right breast and a T1 be N1 ER/PR positive invasive mammary carcinoma of the left breast seen today in routine follow up she is doing well. She specifically denies breast tenderness cough or bone pain..he is currently on arimadex tolerate that well without side effect.most recent mammograms recommend May will BI-RADS 2 benign which I have reviewed.  COMPLICATIONS OF TREATMENT: none  FOLLOW UP COMPLIANCE: keeps appointments   PHYSICAL EXAM:  BP (P) 129/84 (BP Location: Left Arm, Patient Position: Sitting)   Pulse (P) 87   Temp (P) 97.8 F (36.6 C) (Tympanic)   Wt (P) 175 lb 14.8 oz (79.8 kg)   BMI (P) 31.92 kg/m  Lungs are clear to A&P cardiac examination essentially unremarkable with regular rate and rhythm. No dominant mass or nodularity is noted in either breast in 2 positions examined. Incision is well-healed. No axillary or supraclavicular adenopathy is appreciated. Cosmetic result is excellent.Well-developed well-nourished patient in NAD. HEENT reveals PERLA, EOMI, discs not visualized.  Oral cavity is clear. No oral mucosal lesions are identified. Neck is clear without evidence of cervical or supraclavicular adenopathy. Lungs are clear to A&P. Cardiac examination is essentially unremarkable with regular rate and rhythm without murmur rub or thrill. Abdomen is benign with no organomegaly or masses noted. Motor sensory and DTR levels are equal and symmetric in the upper and lower extremities. Cranial nerves II through XII are grossly intact. Proprioception is intact.  No peripheral adenopathy or edema is identified. No motor or sensory levels are noted. Crude visual fields are within normal range.  RADIOLOGY RESULTS: mammograms reviewed and compatible above-stated findings  PLAN: patient is now out 5 years with no evidence of disease. I'm going to discontinue follow-up care. She or he has follow-up mammograms scheduled she's discussing continuation of arimadex with medical oncology. Patient knows to contact us with any concerns at any time.  I would like to take this opportunity to thank you for allowing me to participate in the care of your patient.Noreene Filbert, MD

## 2018-09-29 ENCOUNTER — Telehealth: Payer: Self-pay | Admitting: *Deleted

## 2018-09-29 NOTE — Telephone Encounter (Signed)
Received referral for low dose lung cancer screening CT scan. Message left at phone number listed in EMR for patient to call me back to facilitate scheduling scan.  

## 2018-09-30 ENCOUNTER — Telehealth: Payer: Self-pay | Admitting: *Deleted

## 2018-09-30 NOTE — Telephone Encounter (Signed)
Received referral for low dose lung cancer screening CT scan. Message left at phone number listed in EMR for patient to call me back to facilitate scheduling scan.  

## 2018-10-01 ENCOUNTER — Encounter: Payer: Self-pay | Admitting: *Deleted

## 2018-10-01 ENCOUNTER — Telehealth: Payer: Self-pay | Admitting: *Deleted

## 2018-10-01 NOTE — Telephone Encounter (Signed)
Received referral for low dose lung cancer screening CT scan. Message left at phone number listed in EMR for patient to call me back to facilitate scheduling scan.  

## 2018-12-03 ENCOUNTER — Ambulatory Visit (INDEPENDENT_AMBULATORY_CARE_PROVIDER_SITE_OTHER): Payer: BLUE CROSS/BLUE SHIELD | Admitting: Family Medicine

## 2018-12-03 ENCOUNTER — Other Ambulatory Visit: Payer: Self-pay

## 2018-12-03 ENCOUNTER — Encounter: Payer: Self-pay | Admitting: Family Medicine

## 2018-12-03 VITALS — Ht 62.25 in | Wt 175.0 lb

## 2018-12-03 DIAGNOSIS — M1710 Unilateral primary osteoarthritis, unspecified knee: Secondary | ICD-10-CM | POA: Diagnosis not present

## 2018-12-03 DIAGNOSIS — M179 Osteoarthritis of knee, unspecified: Secondary | ICD-10-CM

## 2018-12-03 MED ORDER — TRAMADOL HCL 50 MG PO TABS: 50.0000 mg | ORAL_TABLET | Freq: Three times a day (TID) | ORAL | 0 refills | Status: AC | PRN

## 2018-12-03 MED ORDER — DICLOFENAC SODIUM 75 MG PO TBEC: 75.0000 mg | DELAYED_RELEASE_TABLET | Freq: Two times a day (BID) | ORAL | 1 refills | Status: DC

## 2018-12-03 MED ORDER — PREDNISONE 20 MG PO TABS
ORAL_TABLET | ORAL | 0 refills | Status: DC
Start: 2018-12-03 — End: 2019-05-05

## 2018-12-03 NOTE — Progress Notes (Signed)
Amy Leonard T. Harlin Mazzoni, MD Primary Care and [place] at [facility] [place], [address] Phone: [phone]  FAX: [phone]  [name] - 58 y.o. female  MRN [id number]  Date of Birth: 04-22-1961  Visit Date: 12/03/2018  PCP: Abner Greenspan, MD  Referred by: Tower, Wynelle Fanny, MD  Virtual Visit via Video Note:  I connected with  Amy Leonard on 12/03/2018 10:40 AM EDT by a video enabled telemedicine application and verified that I am speaking with the correct person using two identifiers.   Location patient: home computer, tablet, or smartphone Location provider: work or home office Consent: Verbal consent directly obtained from [employer]. Persons participating in the virtual visit: patient, provider  I discussed the limitations of evaluation and management by telemedicine and the availability of in person appointments. The patient expressed understanding and agreed to proceed.  History of Present Illness:  The patient is a pleasant lady who I recall well and if seen her and other members of her family over the years.  She has had bilateral knee pain going on for many years.  In the recent weeks her left knee has bothered her quite a bit more, and she has an effusion as well as some surrounding pain of the knee itself.  She is not had any particular injury or trauma to the knee.  No falls.  Review of Systems as above: See pertinent positives and pertinent negatives per HPI No acute distress verbally  Past Medical History, Surgical History, Social History, Family History, Problem List, Medications, and Allergies have been reviewed and updated if relevant.   Observations/Objective/Exam:  An attempt was made to discern vital signs over the phone and per patient if applicable and possible.   General:    Alert, Oriented, appears well and in no acute distress HEENT:     Atraumatic, conjunctiva clear, no  obvious abnormalities on inspection of external nose and ears.  Neck:    Normal movements of the head and neck Pulmonary:     On inspection no signs of respiratory distress, breathing rate appears normal, no obvious gross SOB, gasping or wheezing Cardiovascular:    No obvious cyanosis Musculoskeletal:    Moves all visible extremities without noticeable abnormality Psych / Neurological:     Pleasant and cooperative, no obvious depression or anxiety, speech and thought processing grossly intact  Assessment and Plan:  Osteoarthritis of knee, unspecified laterality, unspecified osteoarthritis type  Complete evaluation of knee pain and arthritis is difficulty without physical exam.  In this setting, we are going to try to help her the best we can with conservative measures over the telephone.  I am going to give her a pulse of some steroids, and additionally, once her steroid course is completed, I am going to have her take some Voltaren p.o. twice daily.  Additionally I am been a give her some tramadol to take on an as-needed basis.  If her symptoms are still ongoing after the COVID-19 outbreak, recommended that she follow-up in the office.  I discussed the assessment and treatment plan with the patient. The patient was provided an opportunity to ask questions and all were answered. The patient agreed with the plan and demonstrated an understanding of the instructions.   The patient was advised to call back or seek an in-person evaluation if the symptoms worsen or if the condition fails to improve as anticipated.  Follow-up: prn unless noted otherwise  below No follow-ups on file.  Meds ordered this encounter  Medications  . predniSONE (DELTASONE) 20 MG tablet    Sig: 2 tabs po daily for 5 days, then 1 tab po daily for 5 days    Dispense:  15 tablet    Refill:  0  . diclofenac (VOLTAREN) 75 MG EC tablet    Sig: Take 1 tablet (75 mg total) by mouth 2 (two) times daily. (use after  prednisone is completed)    Dispense:  60 tablet    Refill:  1  . traMADol (ULTRAM) 50 MG tablet    Sig: Take 1 tablet (50 mg total) by mouth every 8 (eight) hours as needed for up to 5 days for moderate pain.    Dispense:  20 tablet    Refill:  0   No orders of the defined types were placed in this encounter.   Signed,  Maud Deed. Reo Portela, MD

## 2018-12-18 ENCOUNTER — Other Ambulatory Visit: Payer: Self-pay | Admitting: Internal Medicine

## 2019-02-25 ENCOUNTER — Other Ambulatory Visit: Payer: Self-pay | Admitting: *Deleted

## 2019-02-25 MED ORDER — ATORVASTATIN CALCIUM 10 MG PO TABS: 10.0000 mg | ORAL_TABLET | Freq: Every day | ORAL | 0 refills | Status: DC

## 2019-02-25 NOTE — Telephone Encounter (Signed)
Please schedule PE and refill until then  

## 2019-02-25 NOTE — Telephone Encounter (Signed)
I left a detailed message letting her know     I refilled pt's meds and per Tower pt is due for a CPE when able. (virtual med refill is fine if pt doesn't want to come in the office)

## 2019-02-25 NOTE — Telephone Encounter (Signed)
Pt had a recent doxy appt with Dr. Lorelei Pont but no recent or future f/u or CPE appts scheduled, please advise   [facility] [address] [address].

## 2019-02-25 NOTE — Telephone Encounter (Signed)
Med refilled and Morey [facility] will reach out to pt to try and get appt scheduled

## 2019-03-04 DIAGNOSIS — M1611 Unilateral primary osteoarthritis, right hip: Secondary | ICD-10-CM | POA: Diagnosis not present

## 2019-03-04 DIAGNOSIS — M25551 Pain in right hip: Secondary | ICD-10-CM | POA: Diagnosis not present

## 2019-04-29 ENCOUNTER — Telehealth: Payer: Self-pay | Admitting: Family Medicine

## 2019-04-29 DIAGNOSIS — I10 Essential (primary) hypertension: Secondary | ICD-10-CM

## 2019-04-29 DIAGNOSIS — E7800 Pure hypercholesterolemia, unspecified: Secondary | ICD-10-CM

## 2019-04-29 DIAGNOSIS — R7309 Other abnormal glucose: Secondary | ICD-10-CM

## 2019-04-29 DIAGNOSIS — R7303 Prediabetes: Secondary | ICD-10-CM

## 2019-04-29 DIAGNOSIS — Z862 Personal history of diseases of the blood and blood-forming organs and certain disorders involving the immune mechanism: Secondary | ICD-10-CM

## 2019-04-29 NOTE — Telephone Encounter (Signed)
-----   Message from Cloyd Stagers, RT sent at 04/20/2019  9:55 AM EDT ----- Regarding: Lab Orders for Friday 9.4.2020 Please place lab orders for Friday 9.4.2020, office visit for physical on Wednesday 9.9.2020 Thank you, Dyke Maes RT(R)

## 2019-04-30 ENCOUNTER — Other Ambulatory Visit (INDEPENDENT_AMBULATORY_CARE_PROVIDER_SITE_OTHER): Payer: BC Managed Care – PPO

## 2019-04-30 ENCOUNTER — Other Ambulatory Visit: Payer: Self-pay

## 2019-04-30 DIAGNOSIS — I10 Essential (primary) hypertension: Secondary | ICD-10-CM

## 2019-04-30 DIAGNOSIS — Z862 Personal history of diseases of the blood and blood-forming organs and certain disorders involving the immune mechanism: Secondary | ICD-10-CM

## 2019-04-30 DIAGNOSIS — E7800 Pure hypercholesterolemia, unspecified: Secondary | ICD-10-CM

## 2019-04-30 DIAGNOSIS — R7303 Prediabetes: Secondary | ICD-10-CM

## 2019-04-30 LAB — COMPREHENSIVE METABOLIC PANEL
ALT: 25 U/L (ref 0–35)
AST: 19 U/L (ref 0–37)
Albumin: 3.8 g/dL (ref 3.5–5.2)
Alkaline Phosphatase: 53 U/L (ref 39–117)
BUN: 15 mg/dL (ref 6–23)
CO2: 30 mEq/L (ref 19–32)
Calcium: 9.1 mg/dL (ref 8.4–10.5)
Chloride: 105 mEq/L (ref 96–112)
Creatinine, Ser: 0.63 mg/dL (ref 0.40–1.20)
GFR: 96.86 mL/min (ref >60.00)
Glucose, Bld: 103 mg/dL — ABNORMAL HIGH (ref 70–99)
Potassium: 3.9 mEq/L (ref 3.5–5.1)
Sodium: 141 mEq/L (ref 135–145)
Total Bilirubin: 0.5 mg/dL (ref 0.2–1.2)
Total Protein: 6 g/dL (ref 6.0–8.3)

## 2019-04-30 LAB — HEMOGLOBIN A1C: Hgb A1c MFr Bld: 6 % (ref 4.6–6.5)

## 2019-04-30 LAB — CBC WITH DIFFERENTIAL/PLATELET
Basophils Absolute: 0 10*3/uL (ref 0.0–0.1)
Basophils Relative: 0.4 % (ref 0.0–3.0)
Eosinophils Absolute: 0.1 10*3/uL (ref 0.0–0.7)
Eosinophils Relative: 1 % (ref 0.0–5.0)
HCT: 40.5 % (ref 36.0–46.0)
Hemoglobin: 13.7 g/dL (ref 12.0–15.0)
Lymphocytes Relative: 28.5 % (ref 12.0–46.0)
Lymphs Abs: 3.1 10*3/uL (ref 0.7–4.0)
MCHC: 33.8 g/dL (ref 30.0–36.0)
MCV: 98.2 fl (ref 78.0–100.0)
Monocytes Absolute: 0.9 10*3/uL (ref 0.1–1.0)
Monocytes Relative: 8.6 % (ref 3.0–12.0)
Neutro Abs: 6.7 10*3/uL (ref 1.4–7.7)
Neutrophils Relative %: 61.5 % (ref 43.0–77.0)
Platelets: 218 10*3/uL (ref 150.0–400.0)
RBC: 4.12 Mil/uL (ref 3.87–5.11)
RDW: 13.4 % (ref 11.5–15.5)
WBC: 10.9 10*3/uL — ABNORMAL HIGH (ref 4.0–10.5)

## 2019-04-30 LAB — LIPID PANEL
Cholesterol: 123 mg/dL (ref 0–200)
HDL: 46.8 mg/dL (ref >39.00)
LDL Cholesterol: 63 mg/dL (ref 0–99)
NonHDL: 76.45
Total CHOL/HDL Ratio: 3
Triglycerides: 68 mg/dL (ref 0.0–149.0)
VLDL: 13.6 mg/dL (ref 0.0–40.0)

## 2019-04-30 LAB — TSH: TSH: 2.9 u[IU]/mL (ref 0.35–4.50)

## 2019-05-05 ENCOUNTER — Ambulatory Visit (INDEPENDENT_AMBULATORY_CARE_PROVIDER_SITE_OTHER): Payer: BC Managed Care – PPO | Admitting: Family Medicine

## 2019-05-05 ENCOUNTER — Other Ambulatory Visit: Payer: Self-pay

## 2019-05-05 ENCOUNTER — Encounter: Payer: Self-pay | Admitting: Family Medicine

## 2019-05-05 VITALS — BP 136/80 | HR 89 | Temp 96.8°F | Ht 62.5 in | Wt 168.5 lb

## 2019-05-05 DIAGNOSIS — R7309 Other abnormal glucose: Secondary | ICD-10-CM

## 2019-05-05 DIAGNOSIS — E7800 Pure hypercholesterolemia, unspecified: Secondary | ICD-10-CM

## 2019-05-05 DIAGNOSIS — R7303 Prediabetes: Secondary | ICD-10-CM

## 2019-05-05 DIAGNOSIS — Z23 Encounter for immunization: Secondary | ICD-10-CM

## 2019-05-05 DIAGNOSIS — F17200 Nicotine dependence, unspecified, uncomplicated: Secondary | ICD-10-CM

## 2019-05-05 DIAGNOSIS — C50812 Malignant neoplasm of overlapping sites of left female breast: Secondary | ICD-10-CM

## 2019-05-05 DIAGNOSIS — Z170 Estrogen receptor positive status [ER+]: Secondary | ICD-10-CM

## 2019-05-05 DIAGNOSIS — I10 Essential (primary) hypertension: Secondary | ICD-10-CM

## 2019-05-05 DIAGNOSIS — Z0000 Encounter for general adult medical examination without abnormal findings: Secondary | ICD-10-CM | POA: Diagnosis not present

## 2019-05-05 DIAGNOSIS — Z862 Personal history of diseases of the blood and blood-forming organs and certain disorders involving the immune mechanism: Secondary | ICD-10-CM

## 2019-05-05 DIAGNOSIS — G620 Drug-induced polyneuropathy: Secondary | ICD-10-CM

## 2019-05-05 MED ORDER — ATORVASTATIN CALCIUM 10 MG PO TABS: 10.0000 mg | ORAL_TABLET | Freq: Every day | ORAL | 3 refills | Status: DC

## 2019-05-05 MED ORDER — VARENICLINE TARTRATE 1 MG PO TABS: 1.0000 mg | ORAL_TABLET | Freq: Two times a day (BID) | ORAL | 3 refills | Status: DC

## 2019-05-05 MED ORDER — VARENICLINE TARTRATE 0.5 MG X 11 & 1 MG X 42 PO MISC: ORAL | 0 refills | Status: DC

## 2019-05-05 NOTE — Assessment & Plan Note (Signed)
Reviewed health habits including diet and exercise and skin cancer prevention Reviewed appropriate screening tests for age  Also reviewed health mt list, fam hx and immunization status , as well as social and family history   See HPI Pt will schedule her mammogram and f/u with oncol Flu shot today  Enc healthy diet and exercise  chantix px to help quit smoking again  Labs rev

## 2019-05-05 NOTE — Patient Instructions (Addendum)
Please schedule your mammogram  Keep following up with oncology   Flu shot today   To prevent diabetes : Try to get most of your carbohydrates from produce (with the exception of white potatoes)  Eat less bread/pasta/rice/snack foods/cereals/sweets and other items from the middle of the grocery store (processed carbs)   Good luck quitting smoking  Take care of yourself

## 2019-05-05 NOTE — Assessment & Plan Note (Signed)
From breast cancer tx Improving gradually

## 2019-05-05 NOTE — Progress Notes (Signed)
Subjective:    Patient ID: Amy Leonard, female    DOB: [birth year], 58 y.o.   MRN: [id number]  HPI Here for health maintenance exam and to review chronic medical problems    Still working  Always wears her mask  Feeling ok overall  Moved - into a new double wide trailer- did it by herself and thrilled  Is doing things on her own   Son moved out  Can take care of herself now  Has a grand daughter- 15 months old (exciting)   Flu vaccine -today   Mammogram 5/19 -needs to call and re schedule her appt (does not need a referral)  Personal h/o breast cancer  arimidex -still on , continues to see oncology  (taking it for a few more years) has f/u in dec  Self breast exam -no lumps   Pap 6/19 neg with neg HPV No need for STD screening   Tdap 1/14  Colonoscopy 2/14  Smoking status -still smoking  She does want to quit soon  Would like to try chantix again   Zoster status-had shingrix vaccine   dexa 4/18 -osteopenia in [place] Oncologist will schedule it per pt  Falls-none Fractures -none  Supplements-caltrate  Exercise  -very active job    Wt Readings from Last 3 Encounters:  05/05/19 168 lb 8 oz (76.4 kg)  12/03/18 175 lb (79.4 kg)  08/05/18 (P) 175 lb 14.8 oz (79.8 kg)  working on weight loss - recently moved and ate less as well  30.33 kg/m   bp is stable today  Re check BP: 136/80   H/o lacunar infarction in the past  No cp or palpitations or headaches or edema  No side effects to medicines  BP Readings from Last 3 Encounters:  05/05/19 (!) 144/78  08/05/18 (P) 129/84  08/05/18 129/84     Lab Results  Component Value Date   CREATININE 0.63 04/30/2019   BUN 15 04/30/2019   NA 141 04/30/2019   K 3.9 04/30/2019   CL 105 04/30/2019   CO2 30 04/30/2019   Lab Results  Component Value Date   ALT 25 04/30/2019   AST 19 04/30/2019   ALKPHOS 53 04/30/2019   BILITOT 0.5 04/30/2019    Glucose 103  Prediabetes Lab Results  Component Value Date   HGBA1C 6.0 04/30/2019   Up from 5.9  Eats a lot of sweets   Hyperlipidemia Lab Results  Component Value Date   CHOL 123 04/30/2019   CHOL 123 01/27/2018   CHOL 130 07/22/2017   Lab Results  Component Value Date   HDL 46.80 04/30/2019   HDL 40.00 01/27/2018   HDL 45.50 07/22/2017   Lab Results  Component Value Date   LDLCALC 63 04/30/2019   [place] 68 01/27/2018   [place] 74 07/22/2017   Lab Results  Component Value Date   TRIG 68.0 04/30/2019   TRIG 74.0 01/27/2018   TRIG 53.0 07/22/2017   Lab Results  Component Value Date   CHOLHDL 3 04/30/2019   CHOLHDL 3 01/27/2018   CHOLHDL 3 07/22/2017   Lab Results  Component Value Date   LDLDIRECT 130.3 01/26/2010   Atorvastatin and diet   Lab Results  Component Value Date   TSH 2.90 04/30/2019    Lab Results  Component Value Date   WBC 10.9 (H) 04/30/2019   HGB 13.7 04/30/2019   HCT 40.5 04/30/2019   MCV 98.2 04/30/2019   PLT 218.0 04/30/2019    Patient Active  Problem List   Diagnosis Date Noted  . Prediabetes 02/03/2018  . Carcinoma of overlapping sites of left breast in female, estrogen receptor positive ([place]) 07/29/2016  . Lymphedema 10/23/2015  . Carpal tunnel syndrome 10/23/2015  . Encounter for routine gynecological examination 02/10/2015  . Routine general medical examination at a health care facility 02/07/2015  . Degenerative disc disease, lumbar 02/04/2014  . Anterolisthesis 02/04/2014  . Low back pain 02/02/2014  . Postmenopausal estrogen deficiency 09/02/2013  . History of thrombocytopenia 01/06/2013  . History of CVA (cerebrovascular accident) 04/17/2012  . Pure hypercholesterolemia 04/05/2008  . Essential hypertension 03/30/2008  . ANXIETY DEPRESSION 11/17/2007  . TOBACCO USE 11/17/2007  . BACK PAIN, CHRONIC 09/23/2007   Past Medical History:  Diagnosis Date  . Anxiety   . Anxiety and depression   . Barrett's esophageal ulceration   . Basal ganglia infarction ([place]) 04/17/2012  .  Breast cancer ([place]) 2014   BILATERAL LUMPECTOMY  . Carpal tunnel syndrome   . Chronic back pain    spinal stenosis  . Depression   . Elevated WBC count    nl diff  . Erosive gastritis   . Folliculitis [id number]  . GERD (gastroesophageal reflux disease)   . Glaucoma    ?  Marland Kitchen History of chemotherapy 2014   BREAST CA  . HLD (hyperlipidemia)   . HSV infection    recurrent (side and buttox)  . Hypertension   . Migraine   . Personal history of chemotherapy 2014   bilateral breast ca  . Personal history of radiation therapy 2014   bilateral breast ca  . Radiation 2014   BREAST CA  . Tobacco abuse   . Wears dentures    top   Past Surgical History:  Procedure Laterality Date  . 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 BREAST LUMPECTOMY Bilateral 2014   bilateral breast ca  . BTL    . COLONOSCOPY    . epidural steroid injection  2008  . LUMBAR FUSION  03/2016   L4-5  . PARTIAL MASTECTOMY WITH NEEDLE LOCALIZATION AND AXILLARY SENTINEL LYMPH NODE BX Bilateral 11/23/2012   Procedure: PARTIAL MASTECTOMY WITH NEEDLE LOCALIZATION AND AXILLARY SENTINEL LYMPH NODE BX;  Surgeon: Adin Hector, MD;  Location: [facility];  Service: General;  Laterality: Bilateral;  . PORT-A-CATH REMOVAL Right 08/25/2013   Procedure: REMOVAL PORT-A-CATH;  Surgeon: Adin Hector, MD;  Location: [facility];  Service: General;  Laterality: Right;  . PORTACATH PLACEMENT Right 11/23/2012   Procedure: INSERTION PORT-A-CATH;  Surgeon: Adin Hector, MD;  Location: [facility];  Service: General;  Laterality: Right;  . UPPER GI ENDOSCOPY     Social History   Tobacco Use  . Smoking status: Current Every Day Smoker    Packs/day: 0.50    Years: 33.00    Pack years: 16.50    Types: Cigarettes  . Smokeless tobacco: Never Used  Substance Use Topics  . Alcohol use: Yes    Alcohol/week: 0.0 standard drinks    Comment:  beer rarely  . Drug use: No   Family History  Problem Relation Age of Onset  . Stroke Maternal Grandmother   . Diabetes Maternal Grandmother   . Diabetes Mother   . Hypertension Mother   . Leukemia Mother   . Obesity Mother   . Depression Sister   . Bipolar disorder Son  Bipolar / oppositional defiant  . Alcohol abuse Sister   . Drug abuse Sister   . Alcohol abuse Sister   . Drug abuse Sister   . Alcohol abuse Brother   . Drug abuse Brother   . Pancreatic cancer Brother   . Colon cancer Neg Hx   . Breast cancer Neg Hx    Allergies  Allergen Reactions  . Gabapentin Swelling  . Lyrica [Pregabalin] Other (See Comments)    sedated  . Voltaren [Diclofenac Sodium] Nausea And Vomiting  . Bupropion Hcl Nausea Only and Other (See Comments)     GI, sleepy  . Chlorhexidine Gluconate Itching and Rash  . Hydrocod Polst-Cpm Polst Er Nausea And Vomiting    vomiting  . Lisinopril Other (See Comments)    Leg cramps    . Naproxen Sodium Other (See Comments)    does not tolerate   Current Outpatient Medications on File Prior to Visit  Medication Sig Dispense Refill  . Acetaminophen (TYLENOL ARTHRITIS PAIN PO) Take 2 tablets by mouth as needed (pain).     Marland Kitchen anastrozole (ARIMIDEX) 1 MG tablet TAKE 1 TABLET DAILY 90 tablet 1  . aspirin 81 MG chewable tablet Chew 1 tablet (81 mg total) by mouth daily. 90 tablet 3  . b complex vitamins tablet Take 1 tablet by mouth daily.    . calcium carbonate (TUMS - DOSED IN MG ELEMENTAL CALCIUM) 500 MG chewable tablet Chew by mouth as directed.      . Calcium Carbonate-Vitamin D (CALCIUM + D PO) Take 1 tablet by mouth daily.    . meloxicam (MOBIC) 15 MG tablet Take 1 tablet by mouth daily as needed.    Marland Kitchen omeprazole (PRILOSEC) 40 MG capsule Take 1 capsule (40 mg total) by mouth daily. 90 capsule 0   No current facility-administered medications on file prior to visit.     Review of Systems  Constitutional: Negative for activity change, appetite  change, fatigue, fever and unexpected weight change.  HENT: Negative for congestion, ear pain, rhinorrhea, sinus pressure and sore throat.   Eyes: Negative for pain, redness and visual disturbance.  Respiratory: Negative for cough, shortness of breath and wheezing.        Sob when exerting self- ? Due to smoking or out of shape  Cardiovascular: Negative for chest pain and palpitations.  Gastrointestinal: Negative for abdominal pain, blood in stool, constipation and diarrhea.  Endocrine: Negative for polydipsia and polyuria.  Genitourinary: Negative for dysuria, frequency and urgency.  Musculoskeletal: Negative for arthralgias, back pain and myalgias.       Lymphedema R arm  Skin: Negative for pallor and rash.  Allergic/Immunologic: Negative for environmental allergies.  Neurological: Negative for dizziness, syncope and headaches.  Hematological: Negative for adenopathy. Does not bruise/bleed easily.  Psychiatric/Behavioral: Negative for decreased concentration and dysphoric mood. The patient is not nervous/anxious.        Objective:   Physical Exam Constitutional:      General: She is not in acute distress.    Appearance: Normal appearance. She is well-developed. She is not ill-appearing or diaphoretic.  HENT:     Head: Normocephalic and atraumatic.     Right Ear: Tympanic membrane, ear canal and external ear normal.     Left Ear: Tympanic membrane, ear canal and external ear normal.     Nose: Nose normal. No congestion.     Mouth/Throat:     Mouth: Mucous membranes are moist.     Pharynx: Oropharynx is clear. No  posterior oropharyngeal erythema.  Eyes:     General: No scleral icterus.    Extraocular Movements: Extraocular movements intact.     Conjunctiva/sclera: Conjunctivae normal.     Pupils: Pupils are equal, round, and reactive to light.  Neck:     Musculoskeletal: Normal range of motion and neck supple. No neck rigidity or muscular tenderness.     Thyroid: No  thyromegaly.     Vascular: No carotid bruit or JVD.  Cardiovascular:     Rate and Rhythm: Normal rate and regular rhythm.     Pulses: Normal pulses.     Heart sounds: Normal heart sounds. No gallop.   Pulmonary:     Effort: Pulmonary effort is normal. No respiratory distress.     Breath sounds: Normal breath sounds. No wheezing.     Comments: Good air exch Chest:     Chest wall: No tenderness.  Abdominal:     General: Bowel sounds are normal. There is no distension or abdominal bruit.     Palpations: Abdomen is soft. There is no mass.     Tenderness: There is no abdominal tenderness.     Hernia: No hernia is present.  Genitourinary:    Comments: Breast exam: No mass, nodules, thickening, tenderness, bulging, retraction, inflamation, nipple discharge or skin changes noted.  No axillary or clavicular LA.     Baseline scars noted Lymphedema R arm  Musculoskeletal: Normal range of motion.        General: No tenderness.     Right lower leg: No edema.     Left lower leg: No edema.  Lymphadenopathy:     Cervical: No cervical adenopathy.  Skin:    General: Skin is warm and dry.     Coloration: Skin is not pale.     Findings: No erythema or rash.     Comments: Solar lentigines diffusely  Solar aging  Neurological:     Mental Status: She is alert. Mental status is at baseline.     Cranial Nerves: No cranial nerve deficit.     Motor: No abnormal muscle tone.     Coordination: Coordination normal.     Gait: Gait normal.     Deep Tendon Reflexes: Reflexes are normal and symmetric. Reflexes normal.  Psychiatric:        Mood and Affect: Mood normal.        Cognition and Memory: Cognition and memory normal.           Assessment & Plan:   Problem List Items Addressed This Visit      Cardiovascular and Mediastinum   Essential hypertension (Chronic)    bp in fair control at this time  BP Readings from Last 1 Encounters:  05/05/19 136/80   No changes needed Most recent labs  reviewed  Disc lifstyle change with low sodium diet and exercise        Relevant Medications   atorvastatin (LIPITOR) 10 MG tablet     Nervous and Auditory   Drug-induced polyneuropathy (HCC)    From breast cancer tx Improving gradually      Relevant Medications   varenicline (CHANTIX PAK) 0.5 MG X 11 & 1 MG X 42 tablet   varenicline (CHANTIX) 1 MG tablet     Other   Pure hypercholesterolemia    Disc goals for lipids and reasons to control them Rev last labs with pt Rev low sat fat diet in detail Continue atorvastatin and diet       Relevant  Medications   atorvastatin (LIPITOR) 10 MG tablet   TOBACCO USE    Px chantix again to aid with quitting  Pt thinks her breathing is not as good on exertion /poorer exercise tolerance Disc in detail risks of smoking and possible outcomes including copd, vascular/ heart disease, cancer , respiratory and sinus infections  Pt voices understanding       History of thrombocytopenia    Wbc is slt elevated but nl platelets Continue to follow No s/s of infection      Routine general medical examination at a health care facility - Primary    Reviewed health habits including diet and exercise and skin cancer prevention Reviewed appropriate screening tests for age  Also reviewed health mt list, fam hx and immunization status , as well as social and family history   See HPI Pt will schedule her mammogram and f/u with oncol Flu shot today  Enc healthy diet and exercise  chantix px to help quit smoking again  Labs rev      Carcinoma of overlapping sites of left breast in female, estrogen receptor positive ([place])    Overdue for mammogram, pt will schedule L scar noted -no changes  Enc self breast exams F/u with onc in dec as planned  Continues arimidex      Prediabetes    Lab Results  Component Value Date   HGBA1C 6.0 04/30/2019   disc imp of low glycemic diet and wt loss to prevent DM2       RESOLVED: Elevated glucose     Other Visit Diagnoses    Need for influenza vaccination       Relevant Orders   Flu Vaccine QUAD 6+ mos PF IM (Fluarix Quad PF) (Completed)

## 2019-05-05 NOTE — Assessment & Plan Note (Signed)
Disc goals for lipids and reasons to control them Rev last labs with pt Rev low sat fat diet in detail Continue atorvastatin and diet

## 2019-05-05 NOTE — Assessment & Plan Note (Signed)
Px chantix again to aid with quitting  Pt thinks her breathing is not as good on exertion /poorer exercise tolerance Disc in detail risks of smoking and possible outcomes including copd, vascular/ heart disease, cancer , respiratory and sinus infections  Pt voices understanding

## 2019-05-05 NOTE — Assessment & Plan Note (Signed)
Wbc is slt elevated but nl platelets Continue to follow No s/s of infection

## 2019-05-05 NOTE — Assessment & Plan Note (Signed)
Overdue for mammogram, pt will schedule L scar noted -no changes  Enc self breast exams F/u with onc in dec as planned  Continues arimidex

## 2019-05-05 NOTE — Assessment & Plan Note (Signed)
Lab Results  Component Value Date   HGBA1C 6.0 04/30/2019   disc imp of low glycemic diet and wt loss to prevent DM2

## 2019-05-05 NOTE — Assessment & Plan Note (Signed)
bp in fair control at this time  BP Readings from Last 1 Encounters:  05/05/19 136/80   No changes needed Most recent labs reviewed  Disc lifstyle change with low sodium diet and exercise

## 2019-06-04 ENCOUNTER — Other Ambulatory Visit: Payer: Self-pay | Admitting: Internal Medicine

## 2019-06-07 ENCOUNTER — Other Ambulatory Visit: Payer: Self-pay | Admitting: *Deleted

## 2019-06-07 DIAGNOSIS — Z170 Estrogen receptor positive status [ER+]: Secondary | ICD-10-CM

## 2019-06-07 DIAGNOSIS — C50812 Malignant neoplasm of overlapping sites of left female breast: Secondary | ICD-10-CM

## 2019-08-02 ENCOUNTER — Ambulatory Visit
Admission: RE | Admit: 2019-08-02 | Discharge: 2019-08-02 | Disposition: A | Discharge status: Home / Self Care | Payer: BC Managed Care – PPO | Source: Ambulatory Visit | Attending: Internal Medicine | Admitting: Internal Medicine

## 2019-08-02 DIAGNOSIS — M85851 Other specified disorders of bone density and structure, right thigh: Secondary | ICD-10-CM | POA: Diagnosis not present

## 2019-08-02 DIAGNOSIS — Z170 Estrogen receptor positive status [ER+]: Secondary | ICD-10-CM | POA: Insufficient documentation

## 2019-08-02 DIAGNOSIS — Z79811 Long term (current) use of aromatase inhibitors: Secondary | ICD-10-CM | POA: Diagnosis not present

## 2019-08-02 DIAGNOSIS — C50812 Malignant neoplasm of overlapping sites of left female breast: Secondary | ICD-10-CM | POA: Insufficient documentation

## 2019-08-02 DIAGNOSIS — Z1231 Encounter for screening mammogram for malignant neoplasm of breast: Secondary | ICD-10-CM | POA: Diagnosis not present

## 2019-08-03 ENCOUNTER — Encounter: Payer: Self-pay | Admitting: Family Medicine

## 2019-08-03 DIAGNOSIS — M8580 Other specified disorders of bone density and structure, unspecified site: Secondary | ICD-10-CM | POA: Insufficient documentation

## 2019-08-09 ENCOUNTER — Other Ambulatory Visit: Payer: Self-pay

## 2019-08-09 ENCOUNTER — Inpatient Hospital Stay: Payer: BC Managed Care – PPO | Attending: Internal Medicine | Admitting: Internal Medicine

## 2019-08-09 DIAGNOSIS — C50812 Malignant neoplasm of overlapping sites of left female breast: Secondary | ICD-10-CM

## 2019-08-09 DIAGNOSIS — M8580 Other specified disorders of bone density and structure, unspecified site: Secondary | ICD-10-CM | POA: Diagnosis not present

## 2019-08-09 DIAGNOSIS — Z79811 Long term (current) use of aromatase inhibitors: Secondary | ICD-10-CM | POA: Diagnosis not present

## 2019-08-09 DIAGNOSIS — M25551 Pain in right hip: Secondary | ICD-10-CM | POA: Insufficient documentation

## 2019-08-09 DIAGNOSIS — Z171 Estrogen receptor negative status [ER-]: Secondary | ICD-10-CM | POA: Insufficient documentation

## 2019-08-09 DIAGNOSIS — C50911 Malignant neoplasm of unspecified site of right female breast: Secondary | ICD-10-CM | POA: Diagnosis not present

## 2019-08-09 DIAGNOSIS — I10 Essential (primary) hypertension: Secondary | ICD-10-CM | POA: Insufficient documentation

## 2019-08-09 DIAGNOSIS — F17210 Nicotine dependence, cigarettes, uncomplicated: Secondary | ICD-10-CM | POA: Diagnosis not present

## 2019-08-09 DIAGNOSIS — Z79899 Other long term (current) drug therapy: Secondary | ICD-10-CM | POA: Insufficient documentation

## 2019-08-09 DIAGNOSIS — Z170 Estrogen receptor positive status [ER+]: Secondary | ICD-10-CM | POA: Diagnosis not present

## 2019-08-09 NOTE — Assessment & Plan Note (Addendum)
#   Bilateral synchronous breast cancer-early stage. Patient currently on adjuvant Arimidex [started 2015].  Stable.  No evidence of recurrence.  Discussed extended duration of therapy/10 years.  Patient interested.   # BMD Dec 2020- T score= -1.4; -osteopenia- Ca+ vit.  STABLE.  Reviewed the bone density.  Discussed the treatment options-including bisphosphonate injections/pills.  I think it is reasonable to monitor for now.  # Right hip pain likely from arthritis; STABLE.   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  STABLE.   # Smoking- continues to smoking; counseled to quit.  Discussed re: [facility] missed previous phone calls.  Again interested with lung cancer screening.  Will refer back; want to be called in the first month of 2021    # DISPOSITION: # follow up in 12 months; MD-  Dr.B  Cc; [place].

## 2019-08-09 NOTE — Progress Notes (Signed)
[facility] OFFICE PROGRESS NOTE  Patient Care Team: Tower, Wynelle Fanny, MD as PCP - General  Cancer of breast [facility])   Staging form: Breast, AJCC 7th Edition     Clinical: Stage IA (T1c, N0, M0) - Unsigned    Oncology History Overview Note  # 2014- SYNCHRONOUS BIL BREAST CA [Dr.K. Humphrey Rolls; [place]]  # RIGHT-IMC; lumpec & ALND-T1cN0; TNBC s/p RT  # LEFT-IMC;STAGE II  T1bN1 ER/PR-Pos Her 2 Neu-NEG s/p Lumpec & RT  # s/p AC x4- Taxol/ gem-Carbo  # MARCH 2015- STARTED Arimidex   # BMD- 2018- osteopenia  # BRCA testing- NEG [as per pt]  # PN- 1-2; intolerant to Neurontin/Lyrica; s/p accupuncture  # DIAGNOSIS: Breast cancer synchronous #Right side stage I triple negative #Left side-stage II ER PR positive HER-2/neu negative  GOALS: Cure  CURRENT/MOST RECENT THERAPY: Anastrozole [x Ettended]    Carcinoma of overlapping sites of left breast in female, estrogen receptor positive ([place])    INTERVAL HISTORY:  Amy Leonard 58 y.o.  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 She had a recent mammogram/DEXA scan.    Patient denies any new lumps or bumps.  Appetite is good.  No weight loss.  Chronic joint pains-knee pain back pain.  Gotten worse.  Review of Systems  Constitutional: Negative for chills, diaphoresis, fever, malaise/fatigue and weight loss.  HENT: Negative for nosebleeds and sore throat.   Eyes: Negative for double vision.  Respiratory: Negative for cough, hemoptysis, sputum production, shortness of breath and wheezing.   Cardiovascular: Negative for chest pain, palpitations, orthopnea and leg swelling.  Gastrointestinal: Negative for abdominal pain, blood in stool, constipation, diarrhea, heartburn, melena, nausea and vomiting.  Genitourinary: Negative for dysuria, frequency and urgency.  Musculoskeletal: Positive for back pain and joint pain.  Skin: Negative.  Negative for  itching and rash.  Neurological: Negative for dizziness, tingling, focal weakness, weakness and headaches.  Endo/Heme/Allergies: Does not bruise/bleed easily.  Psychiatric/Behavioral: Negative for depression. The patient is not nervous/anxious and does not have insomnia.      PAST MEDICAL HISTORY :  Past Medical History:  Diagnosis Date  . Anxiety   . Anxiety and depression   . Barrett's esophageal ulceration   . 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 Breast cancer ([place]) 2014   BILATERAL LUMPECTOMY  . Carpal tunnel syndrome   . Chronic back pain    spinal stenosis  . Depression   . Elevated WBC count    nl diff  . Erosive gastritis   . Folliculitis 09/28/7626  . GERD (gastroesophageal reflux disease)   . Glaucoma    ?  Marland Kitchen History of chemotherapy 2014   BREAST CA  . HLD (hyperlipidemia)   . HSV infection    recurrent (side and buttox)  . Hypertension   . Migraine   . Personal history of chemotherapy 2014   bilateral breast ca  . Personal history of radiation therapy 2014   bilateral breast ca  . Radiation 2014   BREAST CA  . Tobacco abuse   . Wears dentures    top    PAST SURGICAL HISTORY :   Past Surgical History:  Procedure Laterality Date  . 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 BREAST LUMPECTOMY Bilateral 2014   bilateral breast ca  . BTL    . COLONOSCOPY    . epidural steroid injection  2008  . LUMBAR FUSION  03/2016   L4-5  . PARTIAL MASTECTOMY WITH NEEDLE LOCALIZATION AND AXILLARY SENTINEL LYMPH NODE BX Bilateral 11/23/2012   Procedure: PARTIAL MASTECTOMY WITH NEEDLE LOCALIZATION AND AXILLARY SENTINEL LYMPH NODE BX;  Surgeon: Adin Hector, MD;  Location: [place];  Service: General;  Laterality: Bilateral;  . PORT-A-CATH REMOVAL Right 08/25/2013   Procedure: REMOVAL PORT-A-CATH;  Surgeon: Adin Hector, MD;  Location: [place];  Service: General;  Laterality:  Right;  . PORTACATH PLACEMENT Right 11/23/2012   Procedure: INSERTION PORT-A-CATH;  Surgeon: Adin Hector, MD;  Location: [place];  Service: General;  Laterality: Right;  . UPPER GI ENDOSCOPY      FAMILY HISTORY :   Family History  Problem Relation Age of Onset  . Stroke Maternal Grandmother   . Diabetes Maternal Grandmother   . Diabetes Mother   . Hypertension Mother   . Leukemia Mother   . Obesity Mother   . Depression Sister   . Bipolar disorder Son        Bipolar / oppositional defiant  . Alcohol abuse Sister   . Drug abuse Sister   . Alcohol abuse Sister   . Drug abuse Sister   . Alcohol abuse Brother   . Drug abuse Brother   . Pancreatic cancer Brother   . Colon cancer Neg Hx   . Breast cancer Neg Hx     SOCIAL HISTORY:   Social History   Tobacco Use  . Smoking status: Current Every Day Smoker    Packs/day: 0.50    Years: 33.00    Pack years: 16.50    Types: Cigarettes  . Smokeless tobacco: Never Used  Substance Use Topics  . Alcohol use: Yes    Alcohol/week: 0.0 standard drinks    Comment: beer rarely  . Drug use: No    ALLERGIES:  is allergic to gabapentin; lyrica [pregabalin]; voltaren [diclofenac sodium]; bupropion hcl; chlorhexidine gluconate; hydrocod polst-cpm polst er; lisinopril; and naproxen sodium.  MEDICATIONS:  Current Outpatient Medications  Medication Sig Dispense Refill  . Acetaminophen (TYLENOL ARTHRITIS PAIN PO) Take 2 tablets by mouth as needed (pain).     Marland Kitchen anastrozole (ARIMIDEX) 1 MG tablet TAKE 1 TABLET DAILY 90 tablet 3  . aspirin 81 MG chewable tablet Chew 1 tablet (81 mg total) by mouth daily. 90 tablet 3  . atorvastatin (LIPITOR) 10 MG tablet Take 1 tablet (10 mg total) by mouth daily. 90 tablet 3  . b complex vitamins tablet Take 1 tablet by mouth daily.    . calcium carbonate (TUMS - DOSED IN MG ELEMENTAL CALCIUM) 500 MG chewable tablet Chew by mouth as directed.      . Calcium Carbonate-Vitamin D (CALCIUM  + D PO) Take 1 tablet by mouth daily.    . meloxicam (MOBIC) 15 MG tablet Take 1 tablet by mouth daily as needed.    Marland Kitchen omeprazole (PRILOSEC) 40 MG capsule Take 1 capsule (40 mg total) by mouth daily. 90 capsule 0  . varenicline (CHANTIX PAK) 0.5 MG X 11 & 1 MG X 42 tablet Take one 0.5 mg tablet by mouth once daily for 3 days, then increase to one 0.5 mg tablet twice daily for 4 days, then increase to one 1 mg tablet twice daily. 53 tablet 0  . varenicline (CHANTIX) 1 MG tablet Take 1 tablet (1 mg total) by mouth 2 (two) times daily. 60 tablet 3   No current facility-administered  medications for this visit.    PHYSICAL EXAMINATION: ECOG PERFORMANCE STATUS: 0 - Asymptomatic  BP 130/84 (BP Location: Left Arm, Patient Position: Sitting, Cuff Size: Normal)   Pulse 70   Temp (!) 96.5 F (35.8 C) (Tympanic)   Wt 169 lb (76.7 kg)   BMI 30.42 kg/m   Filed Weights   08/09/19 1110  Weight: 169 lb (76.7 kg)    Physical Exam  Constitutional: She is oriented to person, place, and time and well-developed, well-nourished, and in no distress.  HENT:  Head: Normocephalic and atraumatic.  Mouth/Throat: Oropharynx is clear and moist. No oropharyngeal exudate.  Eyes: Pupils are equal, round, and reactive to light.  Cardiovascular: Normal rate and regular rhythm.  Pulmonary/Chest: No respiratory distress. She has no wheezes.  Abdominal: Soft. Bowel sounds are normal. She exhibits no distension and no mass. There is no abdominal tenderness. There is no rebound and no guarding.  Musculoskeletal:        General: No tenderness or edema. Normal range of motion.     Cervical back: Normal range of motion and neck supple.  Neurological: She is alert and oriented to person, place, and time.  Skin: Skin is warm.  Psychiatric: Affect normal.     LABORATORY DATA:  I have reviewed the data as listed    Component Value Date/Time   NA 141 04/30/2019 0753   NA 140 07/25/2014 1100   NA 140 11/02/2013 1040    K 3.9 04/30/2019 0753   K 4.1 07/25/2014 1100   K 3.9 11/02/2013 1040   CL 105 04/30/2019 0753   CL 105 07/25/2014 1100   CL 109 (H) 02/17/2013 1120   CO2 30 04/30/2019 0753   CO2 30 07/25/2014 1100   CO2 24 11/02/2013 1040   GLUCOSE 103 (H) 04/30/2019 0753   GLUCOSE 97 07/25/2014 1100   GLUCOSE 103 11/02/2013 1040   GLUCOSE 99 02/17/2013 1120   BUN 15 04/30/2019 0753   BUN 15 07/25/2014 1100   BUN 9.3 11/02/2013 1040   CREATININE 0.63 04/30/2019 0753   CREATININE 0.74 07/25/2014 1100   CREATININE 0.7 11/02/2013 1040   CALCIUM 9.1 04/30/2019 0753   CALCIUM 8.6 07/25/2014 1100   CALCIUM 9.5 11/02/2013 1040   PROT 6.0 04/30/2019 0753   PROT 6.4 07/25/2014 1100   PROT 6.2 (L) 11/02/2013 1040   ALBUMIN 3.8 04/30/2019 0753   ALBUMIN 3.5 07/25/2014 1100   ALBUMIN 3.7 11/02/2013 1040   AST 19 04/30/2019 0753   AST 16 07/25/2014 1100   AST 20 11/02/2013 1040   ALT 25 04/30/2019 0753   ALT 24 07/25/2014 1100   ALT 22 11/02/2013 1040   ALKPHOS 53 04/30/2019 0753   ALKPHOS 75 07/25/2014 1100   ALKPHOS 84 11/02/2013 1040   BILITOT 0.5 04/30/2019 0753   BILITOT 0.3 07/25/2014 1100   BILITOT 0.48 11/02/2013 1040   GFRNONAA >60 07/29/2016 0914   GFRNONAA >60 07/25/2014 1100   GFRAA >60 07/29/2016 0914   GFRAA >60 07/25/2014 1100    No results found for: SPEP, UPEP  Lab Results  Component Value Date   WBC 10.9 (H) 04/30/2019   NEUTROABS 6.7 04/30/2019   HGB 13.7 04/30/2019   HCT 40.5 04/30/2019   MCV 98.2 04/30/2019   PLT 218.0 04/30/2019      Chemistry      Component Value Date/Time   NA 141 04/30/2019 0753   NA 140 07/25/2014 1100   NA 140 11/02/2013 1040   K 3.9 04/30/2019  0753   K 4.1 07/25/2014 1100   K 3.9 11/02/2013 1040   CL 105 04/30/2019 0753   CL 105 07/25/2014 1100   CL 109 (H) 02/17/2013 1120   CO2 30 04/30/2019 0753   CO2 30 07/25/2014 1100   CO2 24 11/02/2013 1040   BUN 15 04/30/2019 0753   BUN 15 07/25/2014 1100   BUN 9.3 11/02/2013 1040    CREATININE 0.63 04/30/2019 0753   CREATININE 0.74 07/25/2014 1100   CREATININE 0.7 11/02/2013 1040      Component Value Date/Time   CALCIUM 9.1 04/30/2019 0753   CALCIUM 8.6 07/25/2014 1100   CALCIUM 9.5 11/02/2013 1040   ALKPHOS 53 04/30/2019 0753   ALKPHOS 75 07/25/2014 1100   ALKPHOS 84 11/02/2013 1040   AST 19 04/30/2019 0753   AST 16 07/25/2014 1100   AST 20 11/02/2013 1040   ALT 25 04/30/2019 0753   ALT 24 07/25/2014 1100   ALT 22 11/02/2013 1040   BILITOT 0.5 04/30/2019 0753   BILITOT 0.3 07/25/2014 1100   BILITOT 0.48 11/02/2013 1040       RADIOGRAPHIC STUDIES: I have personally reviewed the radiological images as listed and agreed with the findings in the report. No results found.   ASSESSMENT & PLAN:  Carcinoma of overlapping sites of left breast in female, estrogen receptor positive ([place]) # Bilateral synchronous breast cancer-early stage. Patient currently on adjuvant Arimidex [started 2015].  Stable.  No evidence of recurrence.  Discussed extended duration of therapy/10 years.  Patient interested.   # BMD Dec 2020- T score= -1.4; -osteopenia- Ca+ vit.  STABLE.  Reviewed the bone density.  Discussed the treatment options-including bisphosphonate injections/pills.  I think it is reasonable to monitor for now.  # Right hip pain likely from arthritis; STABLE.   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  STABLE.   # Smoking- continues to smoking; counseled to quit.  Discussed re: [facility] missed previous phone calls.  Again interested with lung cancer screening.  Will refer back; want to be called in the first month of 2021    # DISPOSITION: # follow up in 12 months; MD-  Dr.B  Cc; [place].     No orders of the defined types were placed in this encounter.  All questions were answered. The patient knows to call the clinic with any problems, questions or concerns.      Cammie Sickle, MD 08/09/2019 12:47 PM

## 2019-10-01 ENCOUNTER — Other Ambulatory Visit: Payer: Self-pay | Admitting: *Deleted

## 2019-10-01 ENCOUNTER — Other Ambulatory Visit: Payer: Self-pay | Admitting: Oncology

## 2019-10-01 ENCOUNTER — Other Ambulatory Visit: Payer: Self-pay

## 2019-10-01 ENCOUNTER — Ambulatory Visit
Admission: RE | Admit: 2019-10-01 | Discharge: 2019-10-01 | Disposition: A | Discharge status: Home / Self Care | Payer: BC Managed Care – PPO | Source: Ambulatory Visit | Attending: Oncology | Admitting: Oncology

## 2019-10-01 ENCOUNTER — Telehealth: Payer: Self-pay | Admitting: *Deleted

## 2019-10-01 DIAGNOSIS — M7989 Other specified soft tissue disorders: Secondary | ICD-10-CM | POA: Diagnosis not present

## 2019-10-01 NOTE — Telephone Encounter (Signed)
Spoke to patient via telephone and gave her the results of her ultrasound today, which was negative for DVT. Patient expressed understanding and stated that she thought she might have fluid on the knee that needed to be drained off. She stated that she had a similar problem with that knee several years ago. Offered to see her in the clinic for an evaluation and/or make a referral to [facility]. She stated that she would like to see orthopedics next Wednesday or Thursday if possible, because she will be off of work. Will send referral to [facility] today. dhs

## 2019-10-01 NOTE — Progress Notes (Signed)
Re: Leg swelling-Right  Spoke to patients daughter and patient is experiencing right leg pain and swelling that has been present for several days.  Patient has history of synchronous bilateral breast cancer-right and left radiation and lumpectomy.  She completed 4 cycles of AC Taxol/gemcitabine-carbo many years ago.  She is on Arimidex extended therapy which she started in March 2015.  Most recent mammogram was negative for recurrence.  Will order stat ultrasound of right leg to rule out DVT.  Faythe Casa, NP 10/01/2019 9:10 AM

## 2019-10-01 NOTE — Progress Notes (Signed)
Re: Right leg swelling  Ultrasound of right leg was negative for DVT.  Patient states she has past medical history positive for osteoarthritis of knee.  She denied any injury.  She was given oral steroids, diclofenac and tramadol for pain.  Symptoms improved.  She remembers having to have fluid removed from that right knee as well.  She would like to be referred back to orthopedics for further evaluation of her right knee and leg.  Referral sent.  Patient will receive a phone call from [facility] clinic orthopedics in the next week or so.  Faythe Casa, NP 10/01/2019 3:38 PM

## 2019-10-07 DIAGNOSIS — M25561 Pain in right knee: Secondary | ICD-10-CM | POA: Diagnosis not present

## 2019-10-07 DIAGNOSIS — Q741 Congenital malformation of knee: Secondary | ICD-10-CM | POA: Diagnosis not present

## 2019-10-07 DIAGNOSIS — G8929 Other chronic pain: Secondary | ICD-10-CM | POA: Diagnosis not present

## 2019-10-07 DIAGNOSIS — M1711 Unilateral primary osteoarthritis, right knee: Secondary | ICD-10-CM | POA: Diagnosis not present

## 2019-10-28 DIAGNOSIS — Z23 Encounter for immunization: Secondary | ICD-10-CM | POA: Diagnosis not present

## 2019-11-29 DIAGNOSIS — Z23 Encounter for immunization: Secondary | ICD-10-CM | POA: Diagnosis not present

## 2020-01-17 ENCOUNTER — Emergency Department: Payer: BC Managed Care – PPO

## 2020-01-17 ENCOUNTER — Other Ambulatory Visit: Payer: Self-pay

## 2020-01-17 ENCOUNTER — Emergency Department
Admission: EM | Admit: 2020-01-17 | Discharge: 2020-01-18 | Disposition: A | Discharge status: Home / Self Care | Payer: BC Managed Care – PPO | Attending: Emergency Medicine | Admitting: Emergency Medicine

## 2020-01-17 DIAGNOSIS — Y9389 Activity, other specified: Secondary | ICD-10-CM | POA: Insufficient documentation

## 2020-01-17 DIAGNOSIS — M25512 Pain in left shoulder: Secondary | ICD-10-CM | POA: Diagnosis not present

## 2020-01-17 DIAGNOSIS — M7918 Myalgia, other site: Secondary | ICD-10-CM

## 2020-01-17 DIAGNOSIS — M545 Low back pain: Secondary | ICD-10-CM | POA: Diagnosis not present

## 2020-01-17 DIAGNOSIS — M542 Cervicalgia: Secondary | ICD-10-CM | POA: Diagnosis not present

## 2020-01-17 DIAGNOSIS — Y92410 Unspecified street and highway as the place of occurrence of the external cause: Secondary | ICD-10-CM | POA: Diagnosis not present

## 2020-01-17 DIAGNOSIS — Y999 Unspecified external cause status: Secondary | ICD-10-CM | POA: Insufficient documentation

## 2020-01-17 DIAGNOSIS — R519 Headache, unspecified: Secondary | ICD-10-CM | POA: Insufficient documentation

## 2020-01-17 DIAGNOSIS — F17210 Nicotine dependence, cigarettes, uncomplicated: Secondary | ICD-10-CM | POA: Diagnosis not present
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
## 2020-01-18 DIAGNOSIS — S199XXA Unspecified injury of neck, initial encounter: Secondary | ICD-10-CM | POA: Diagnosis not present

## 2020-01-18 DIAGNOSIS — S3992XA Unspecified injury of lower back, initial encounter: Secondary | ICD-10-CM | POA: Diagnosis not present

## 2020-01-18 MED ORDER — TRAMADOL HCL 50 MG PO TABS: 50.0000 mg | ORAL_TABLET | Freq: Two times a day (BID) | ORAL | 0 refills | Status: AC

## 2020-01-18 MED ORDER — CYCLOBENZAPRINE HCL 5 MG PO TABS: 5.0000 mg | ORAL_TABLET | Freq: Three times a day (TID) | ORAL | 0 refills | Status: DC | PRN

## 2020-01-18 NOTE — Discharge Instructions (Signed)
Your exam, and x-rays are negative for any acute findings.  You should follow up with your primary provider return to the ED for continued or worsening symptoms.  Take the prescription medications as provided.

## 2020-01-19 ENCOUNTER — Emergency Department: Payer: BC Managed Care – PPO

## 2020-01-19 ENCOUNTER — Telehealth: Payer: Self-pay

## 2020-01-19 ENCOUNTER — Other Ambulatory Visit: Payer: Self-pay

## 2020-01-19 ENCOUNTER — Emergency Department
Admission: EM | Admit: 2020-01-19 | Discharge: 2020-01-19 | Disposition: A | Discharge status: Home / Self Care | Payer: BC Managed Care – PPO | Attending: Emergency Medicine | Admitting: Emergency Medicine

## 2020-01-19 DIAGNOSIS — M2550 Pain in unspecified joint: Secondary | ICD-10-CM | POA: Diagnosis not present

## 2020-01-19 DIAGNOSIS — F17210 Nicotine dependence, cigarettes, uncomplicated: Secondary | ICD-10-CM | POA: Diagnosis not present

## 2020-01-19 DIAGNOSIS — I10 Essential (primary) hypertension: Secondary | ICD-10-CM | POA: Diagnosis not present

## 2020-01-19 DIAGNOSIS — R519 Headache, unspecified: Secondary | ICD-10-CM | POA: Diagnosis not present

## 2020-01-19 DIAGNOSIS — Z7982 Long term (current) use of aspirin: Secondary | ICD-10-CM | POA: Diagnosis not present

## 2020-01-19 DIAGNOSIS — Z923 Personal history of irradiation: Secondary | ICD-10-CM | POA: Insufficient documentation

## 2020-01-19 DIAGNOSIS — Z79899 Other long term (current) drug therapy: Secondary | ICD-10-CM | POA: Diagnosis not present

## 2020-01-19 DIAGNOSIS — Z9221 Personal history of antineoplastic chemotherapy: Secondary | ICD-10-CM | POA: Insufficient documentation

## 2020-01-19 DIAGNOSIS — Z8673 Personal history of transient ischemic attack (TIA), and cerebral infarction without residual deficits: Secondary | ICD-10-CM | POA: Insufficient documentation

## 2020-01-19 DIAGNOSIS — Z853 Personal history of malignant neoplasm of breast: Secondary | ICD-10-CM | POA: Diagnosis not present

## 2020-01-19 DIAGNOSIS — F419 Anxiety disorder, unspecified: Secondary | ICD-10-CM | POA: Diagnosis not present

## 2020-01-19 MED ORDER — TRAMADOL HCL 50 MG PO TABS
50.0000 mg | ORAL_TABLET | Freq: Once | ORAL | Status: AC
  Administered 2020-01-19: 50 mg via ORAL
  Filled 2020-01-19: qty 1

## 2020-01-19 MED ORDER — TRAMADOL HCL 50 MG PO TABS: 50.0000 mg | ORAL_TABLET | Freq: Four times a day (QID) | ORAL | 0 refills | Status: DC | PRN
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
## 2020-01-19 NOTE — Telephone Encounter (Signed)
Thanks for letting me know  I agree with advisement to go to ER for head/vision symptoms   She may need a CT scan  Possible concussion

## 2020-01-20 ENCOUNTER — Ambulatory Visit (INDEPENDENT_AMBULATORY_CARE_PROVIDER_SITE_OTHER): Payer: BC Managed Care – PPO | Admitting: Family Medicine

## 2020-01-20 ENCOUNTER — Encounter: Payer: Self-pay | Admitting: Family Medicine

## 2020-01-20 DIAGNOSIS — S161XXA Strain of muscle, fascia and tendon at neck level, initial encounter: Secondary | ICD-10-CM | POA: Insufficient documentation

## 2020-01-20 DIAGNOSIS — S060X0A Concussion without loss of consciousness, initial encounter: Secondary | ICD-10-CM

## 2020-01-20 DIAGNOSIS — S060X9A Concussion with loss of consciousness of unspecified duration, initial encounter: Secondary | ICD-10-CM | POA: Insufficient documentation

## 2020-01-20 DIAGNOSIS — S060XAA Concussion with loss of consciousness status unknown, initial encounter: Secondary | ICD-10-CM | POA: Insufficient documentation

## 2020-01-20 DIAGNOSIS — S161XXS Strain of muscle, fascia and tendon at neck level, sequela: Secondary | ICD-10-CM

## 2020-01-20 NOTE — Patient Instructions (Signed)
I'm glad the neck pain is improving Heat helps  Flexeril may help (but can sedate)  Take a look at the rehab stretches/exercises  Alert me if this does not continue to improve   I think you have a mild concussion (the CT scan is reassuring)  Brain rest will help you get better faster (periods of time without stimulation and lying down)  Try ice on your head/forehead  Tramadol as needed (caution of sedation)  If symptoms suddenly worsen or if you develop new symptoms like dizziness or nausea -let us know and go to ER if necessary
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
## 2020-01-20 NOTE — Assessment & Plan Note (Signed)
With expected back and neck soreness  Improving  Do not expect need for PT referral  Headache is improved  Given rehab/neck strain exercises  Update if not starting to improve in a week or if worsening

## 2020-05-22 ENCOUNTER — Other Ambulatory Visit: Payer: Self-pay | Admitting: *Deleted

## 2020-05-22 MED ORDER — ANASTROZOLE 1 MG PO TABS: 1.0000 mg | ORAL_TABLET | Freq: Every day | ORAL | 0 refills | Status: DC

## 2020-06-08 ENCOUNTER — Other Ambulatory Visit: Payer: Self-pay | Admitting: Family Medicine

## 2020-07-07 ENCOUNTER — Other Ambulatory Visit: Payer: Self-pay | Admitting: Family Medicine

## 2020-07-07 DIAGNOSIS — Z1231 Encounter for screening mammogram for malignant neoplasm of breast: Secondary | ICD-10-CM

## 2020-08-04 ENCOUNTER — Ambulatory Visit
Admission: RE | Admit: 2020-08-04 | Discharge: 2020-08-04 | Disposition: A | Discharge status: Home / Self Care | Payer: BC Managed Care – PPO | Source: Ambulatory Visit | Attending: Family Medicine | Admitting: Family Medicine

## 2020-08-04 ENCOUNTER — Other Ambulatory Visit: Payer: Self-pay

## 2020-08-04 DIAGNOSIS — Z1231 Encounter for screening mammogram for malignant neoplasm of breast: Secondary | ICD-10-CM | POA: Insufficient documentation

## 2020-08-08 ENCOUNTER — Encounter: Payer: Self-pay | Admitting: Internal Medicine

## 2020-08-08 ENCOUNTER — Inpatient Hospital Stay: Payer: BC Managed Care – PPO | Attending: Internal Medicine | Admitting: Internal Medicine

## 2020-08-08 ENCOUNTER — Other Ambulatory Visit: Payer: Self-pay

## 2020-08-08 DIAGNOSIS — Z9221 Personal history of antineoplastic chemotherapy: Secondary | ICD-10-CM | POA: Diagnosis not present

## 2020-08-08 DIAGNOSIS — G620 Drug-induced polyneuropathy: Secondary | ICD-10-CM | POA: Insufficient documentation

## 2020-08-08 DIAGNOSIS — F17210 Nicotine dependence, cigarettes, uncomplicated: Secondary | ICD-10-CM | POA: Insufficient documentation

## 2020-08-08 DIAGNOSIS — Z806 Family history of leukemia: Secondary | ICD-10-CM | POA: Insufficient documentation

## 2020-08-08 DIAGNOSIS — Z170 Estrogen receptor positive status [ER+]: Secondary | ICD-10-CM

## 2020-08-08 DIAGNOSIS — T451X5A Adverse effect of antineoplastic and immunosuppressive drugs, initial encounter: Secondary | ICD-10-CM | POA: Diagnosis not present

## 2020-08-08 DIAGNOSIS — C50812 Malignant neoplasm of overlapping sites of left female breast: Secondary | ICD-10-CM

## 2020-08-08 DIAGNOSIS — M8580 Other specified disorders of bone density and structure, unspecified site: Secondary | ICD-10-CM | POA: Insufficient documentation

## 2020-08-08 DIAGNOSIS — C50411 Malignant neoplasm of upper-outer quadrant of right female breast: Secondary | ICD-10-CM | POA: Diagnosis not present

## 2020-08-08 DIAGNOSIS — Z9013 Acquired absence of bilateral breasts and nipples: Secondary | ICD-10-CM | POA: Diagnosis not present

## 2020-08-08 DIAGNOSIS — Z923 Personal history of irradiation: Secondary | ICD-10-CM | POA: Diagnosis not present

## 2020-08-08 DIAGNOSIS — Z79811 Long term (current) use of aromatase inhibitors: Secondary | ICD-10-CM | POA: Insufficient documentation

## 2020-08-08 NOTE — Assessment & Plan Note (Addendum)
#   Bilateral synchronous breast cancer-early stage. Patient currently on adjuvant Arimidex [started 2015].  Stable.  No evidence of recurrence.  Discussed extended duration of therapy/10 years.  Patient interested; Mammogram DEc 2021-normal.  # BMD Dec 2020- T score= -1.4; -osteopenia- Ca+ vit.  STABLE.  Reviewed the bone density.  Discussed the treatment options-including bisphosphonate injections/pills.  I think it is reasonable to monitor for now.  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  STABLE.   # Smoking- continues to smoking; counseled to quit.  Discussed re: [facility] missed previous phone calls.  Not interested.  # DISPOSITION: # follow up in 12 months; MD; labs- cbc/cmp-  Dr.B  Cc; [place].   

## 2020-08-08 NOTE — Progress Notes (Signed)
[place] OFFICE PROGRESS NOTE  Patient Care Team: Tower, Wynelle Fanny, MD as PCP - General  Cancer of breast [facility])   Staging form: Breast, AJCC 7th Edition     Clinical: Stage IA (T1c, N0, M0) - Unsigned    Oncology History Overview Note  # 2014- SYNCHRONOUS BIL BREAST CA [Dr.K. Humphrey Rolls; [place]]  # RIGHT-IMC; lumpec & ALND-T1cN0; TNBC s/p RT  # LEFT-IMC;STAGE II  T1bN1 ER/PR-Pos Her 2 Neu-NEG s/p Lumpec & RT  # s/p AC x4- Taxol/ gem-Carbo  # MARCH 2015- STARTED Arimidex   # BMD- 2018- osteopenia  # BRCA testing- NEG [as per pt]  # PN- 1-2; intolerant to Neurontin/Lyrica; s/p accupuncture  # DIAGNOSIS: Breast cancer synchronous #Right side stage I triple negative #Left side-stage II ER PR positive HER-2/neu negative  GOALS: Cure  CURRENT/MOST RECENT THERAPY: Anastrozole [x Ettended]    Carcinoma of overlapping sites of left breast in female, estrogen receptor positive ([place])    INTERVAL HISTORY:  Amy Leonard 59 y.o.  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  Patient denies any new lumps or bumps.  Appetite is good.  No weight loss.  Chronic joint pains knee pains.  Not any worse.  Patient continues to be compliant with Arimidex. Review of Systems  Constitutional: Negative for chills, diaphoresis, fever, malaise/fatigue and weight loss.  HENT: Negative for nosebleeds and sore throat.   Eyes: Negative for double vision.  Respiratory: Negative for cough, hemoptysis, sputum production, shortness of breath and wheezing.   Cardiovascular: Negative for chest pain, palpitations, orthopnea and leg swelling.  Gastrointestinal: Negative for abdominal pain, blood in stool, constipation, diarrhea, heartburn, melena, nausea and vomiting.  Genitourinary: Negative for dysuria, frequency and urgency.  Musculoskeletal: Positive for back pain and joint pain.  Skin: Negative.  Negative for  itching and rash.  Neurological: Negative for dizziness, tingling, focal weakness, weakness and headaches.  Endo/Heme/Allergies: Does not bruise/bleed easily.  Psychiatric/Behavioral: Negative for depression. The patient is not nervous/anxious and does not have insomnia.      PAST MEDICAL HISTORY :  Past Medical History:  Diagnosis Date  . Anxiety   . Anxiety and depression   . Barrett's esophageal ulceration   . 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 Breast cancer ([place]) 2014   BILATERAL LUMPECTOMY  . Carpal tunnel syndrome   . Chronic back pain    spinal stenosis  . Depression   . Elevated WBC count    nl diff  . Erosive gastritis   . Folliculitis 03/27/9936  . GERD (gastroesophageal reflux disease)   . Glaucoma    ?  Marland Kitchen History of chemotherapy 2014   BREAST CA  . HLD (hyperlipidemia)   . HSV infection    recurrent (side and buttox)  . Hypertension   . Migraine   . Personal history of chemotherapy 2014   bilateral breast ca  . Personal history of radiation therapy 2014   bilateral breast ca  . Radiation 2014   BREAST CA  . Tobacco abuse   . Wears dentures    top    PAST SURGICAL HISTORY :   Past Surgical History:  Procedure Laterality Date  . 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 BREAST LUMPECTOMY Bilateral 2014   bilateral breast ca  . BTL    . COLONOSCOPY    . epidural steroid injection  2008  . LUMBAR FUSION  03/2016   L4-5  . PARTIAL MASTECTOMY WITH NEEDLE LOCALIZATION AND AXILLARY SENTINEL LYMPH NODE BX Bilateral 11/23/2012   Procedure: PARTIAL MASTECTOMY WITH NEEDLE LOCALIZATION AND AXILLARY SENTINEL LYMPH NODE BX;  Surgeon: Adin Hector, MD;  Location: [place];  Service: General;  Laterality: Bilateral;  . PORT-A-CATH REMOVAL Right 08/25/2013   Procedure: REMOVAL PORT-A-CATH;  Surgeon: Adin Hector, MD;  Location: [place];  Service: General;  Laterality:  Right;  . PORTACATH PLACEMENT Right 11/23/2012   Procedure: INSERTION PORT-A-CATH;  Surgeon: Adin Hector, MD;  Location: [place];  Service: General;  Laterality: Right;  . UPPER GI ENDOSCOPY      FAMILY HISTORY :   Family History  Problem Relation Age of Onset  . Stroke Maternal Grandmother   . Diabetes Maternal Grandmother   . Diabetes Mother   . Hypertension Mother   . Leukemia Mother   . Obesity Mother   . Depression Sister   . Bipolar disorder Son        Bipolar / oppositional defiant  . Alcohol abuse Sister   . Drug abuse Sister   . Alcohol abuse Sister   . Drug abuse Sister   . Alcohol abuse Brother   . Drug abuse Brother   . Pancreatic cancer Brother   . Colon cancer Neg Hx   . Breast cancer Neg Hx     SOCIAL HISTORY:   Social History   Tobacco Use  . Smoking status: Current Every Day Smoker    Packs/day: 0.50    Years: 33.00    Pack years: 16.50    Types: Cigarettes  . Smokeless tobacco: Never Used  Substance Use Topics  . Alcohol use: Yes    Alcohol/week: 0.0 standard drinks    Comment: beer rarely  . Drug use: No    ALLERGIES:  is allergic to gabapentin, lyrica [pregabalin], voltaren [diclofenac sodium], bupropion hcl, chlorhexidine gluconate, hydrocod polst-cpm polst er, lisinopril, and naproxen sodium.  MEDICATIONS:  Current Outpatient Medications  Medication Sig Dispense Refill  . Acetaminophen (TYLENOL ARTHRITIS PAIN PO) Take 2 tablets by mouth as needed (pain).     Marland Kitchen anastrozole (ARIMIDEX) 1 MG tablet Take 1 tablet (1 mg total) by mouth daily. 90 tablet 0  . aspirin 81 MG chewable tablet Chew 1 tablet (81 mg total) by mouth daily. 90 tablet 3  . atorvastatin (LIPITOR) 10 MG tablet TAKE 1 TABLET BY MOUTH EVERY DAY 90 tablet 0  . b complex vitamins tablet Take 1 tablet by mouth daily.    . calcium carbonate (TUMS - DOSED IN MG ELEMENTAL CALCIUM) 500 MG chewable tablet Chew by mouth as directed.    . Calcium Carbonate-Vitamin  D (CALCIUM + D PO) Take 1 tablet by mouth daily.    Marland Kitchen omeprazole (PRILOSEC) 40 MG capsule Take 1 capsule (40 mg total) by mouth daily. 90 capsule 0  . traMADol (ULTRAM) 50 MG tablet Take 1 tablet (50 mg total) by mouth every 6 (six) hours as needed for moderate pain. 12 tablet 0   No current facility-administered medications for this visit.    PHYSICAL EXAMINATION: ECOG PERFORMANCE STATUS: 0 - Asymptomatic  BP 115/65 (BP Location: Left Arm, Patient Position: Sitting, Cuff Size: Large)   Pulse 86   Temp (!) 97.3 F (36.3 C) (Tympanic)   Resp 18   Ht $R'5\' 3"'Uu$  (1.6 m)   Wt 164 lb 12.8 oz (74.8 kg)  SpO2 99%   BMI 29.19 kg/m   Filed Weights   08/08/20 1046  Weight: 164 lb 12.8 oz (74.8 kg)    Physical Exam HENT:     Head: Normocephalic and atraumatic.     Mouth/Throat:     Pharynx: No oropharyngeal exudate.  Eyes:     Pupils: Pupils are equal, round, and reactive to light.  Cardiovascular:     Rate and Rhythm: Normal rate and regular rhythm.  Pulmonary:     Effort: No respiratory distress.     Breath sounds: No wheezing.  Abdominal:     General: Bowel sounds are normal. There is no distension.     Palpations: Abdomen is soft. There is no mass.     Tenderness: There is no abdominal tenderness. There is no guarding or rebound.  Musculoskeletal:        General: No tenderness. Normal range of motion.     Cervical back: Normal range of motion and neck supple.  Skin:    General: Skin is warm.  Neurological:     Mental Status: She is alert and oriented to person, place, and time.  Psychiatric:        Mood and Affect: Affect normal.      LABORATORY DATA:  I have reviewed the data as listed    Component Value Date/Time   NA 141 04/30/2019 0753   NA 140 07/25/2014 1100   NA 140 11/02/2013 1040   K 3.9 04/30/2019 0753   K 4.1 07/25/2014 1100   K 3.9 11/02/2013 1040   CL 105 04/30/2019 0753   CL 105 07/25/2014 1100   CL 109 (H) 02/17/2013 1120   CO2 30 04/30/2019  0753   CO2 30 07/25/2014 1100   CO2 24 11/02/2013 1040   GLUCOSE 103 (H) 04/30/2019 0753   GLUCOSE 97 07/25/2014 1100   GLUCOSE 103 11/02/2013 1040   GLUCOSE 99 02/17/2013 1120   BUN 15 04/30/2019 0753   BUN 15 07/25/2014 1100   BUN 9.3 11/02/2013 1040   CREATININE 0.63 04/30/2019 0753   CREATININE 0.74 07/25/2014 1100   CREATININE 0.7 11/02/2013 1040   CALCIUM 9.1 04/30/2019 0753   CALCIUM 8.6 07/25/2014 1100   CALCIUM 9.5 11/02/2013 1040   PROT 6.0 04/30/2019 0753   PROT 6.4 07/25/2014 1100   PROT 6.2 (L) 11/02/2013 1040   ALBUMIN 3.8 04/30/2019 0753   ALBUMIN 3.5 07/25/2014 1100   ALBUMIN 3.7 11/02/2013 1040   AST 19 04/30/2019 0753   AST 16 07/25/2014 1100   AST 20 11/02/2013 1040   ALT 25 04/30/2019 0753   ALT 24 07/25/2014 1100   ALT 22 11/02/2013 1040   ALKPHOS 53 04/30/2019 0753   ALKPHOS 75 07/25/2014 1100   ALKPHOS 84 11/02/2013 1040   BILITOT 0.5 04/30/2019 0753   BILITOT 0.3 07/25/2014 1100   BILITOT 0.48 11/02/2013 1040   GFRNONAA >60 07/29/2016 0914   GFRNONAA >60 07/25/2014 1100   GFRAA >60 07/29/2016 0914   GFRAA >60 07/25/2014 1100    No results found for: SPEP, UPEP  Lab Results  Component Value Date   WBC 10.9 (H) 04/30/2019   NEUTROABS 6.7 04/30/2019   HGB 13.7 04/30/2019   HCT 40.5 04/30/2019   MCV 98.2 04/30/2019   PLT 218.0 04/30/2019      Chemistry      Component Value Date/Time   NA 141 04/30/2019 0753   NA 140 07/25/2014 1100   NA 140 11/02/2013 1040   K 3.9  04/30/2019 0753   K 4.1 07/25/2014 1100   K 3.9 11/02/2013 1040   CL 105 04/30/2019 0753   CL 105 07/25/2014 1100   CL 109 (H) 02/17/2013 1120   CO2 30 04/30/2019 0753   CO2 30 07/25/2014 1100   CO2 24 11/02/2013 1040   BUN 15 04/30/2019 0753   BUN 15 07/25/2014 1100   BUN 9.3 11/02/2013 1040   CREATININE 0.63 04/30/2019 0753   CREATININE 0.74 07/25/2014 1100   CREATININE 0.7 11/02/2013 1040      Component Value Date/Time   CALCIUM 9.1 04/30/2019 0753   CALCIUM  8.6 07/25/2014 1100   CALCIUM 9.5 11/02/2013 1040   ALKPHOS 53 04/30/2019 0753   ALKPHOS 75 07/25/2014 1100   ALKPHOS 84 11/02/2013 1040   AST 19 04/30/2019 0753   AST 16 07/25/2014 1100   AST 20 11/02/2013 1040   ALT 25 04/30/2019 0753   ALT 24 07/25/2014 1100   ALT 22 11/02/2013 1040   BILITOT 0.5 04/30/2019 0753   BILITOT 0.3 07/25/2014 1100   BILITOT 0.48 11/02/2013 1040       RADIOGRAPHIC STUDIES: I have personally reviewed the radiological images as listed and agreed with the findings in the report. No results found.   ASSESSMENT & PLAN:  Carcinoma of overlapping sites of left breast in female, estrogen receptor positive ([place]) # Bilateral synchronous breast cancer-early stage. Patient currently on adjuvant Arimidex [started 2015].  Stable.  No evidence of recurrence.  Discussed extended duration of therapy/10 years.  Patient interested; Mammogram DEc 2021-normal.  # BMD Dec 2020- T score= -1.4; -osteopenia- Ca+ vit.  STABLE.  Reviewed the bone density.  Discussed the treatment options-including bisphosphonate injections/pills.  I think it is reasonable to monitor for now.  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  STABLE.   # Smoking- continues to smoking; counseled to quit.  Discussed re: [facility] missed previous phone calls.  Not interested.  # DISPOSITION: # follow up in 12 months; MD; labs- cbc/cmp-  Dr.B  Cc; [place].     Orders Placed This Encounter  Procedures  . Comprehensive metabolic panel    Standing Status:   Future    Standing Expiration Date:   02/06/2022  . CBC with Differential    Standing Status:   Future    Standing Expiration Date:   02/06/2022   All questions were answered. The patient knows to call the clinic with any problems, questions or concerns.      Cammie Sickle, MD 08/15/2020 11:13 PM

## 2020-08-16 DIAGNOSIS — H04123 Dry eye syndrome of bilateral lacrimal glands: Secondary | ICD-10-CM | POA: Diagnosis not present

## 2020-08-16 DIAGNOSIS — H1132 Conjunctival hemorrhage, left eye: Secondary | ICD-10-CM | POA: Diagnosis not present

## 2020-08-16 DIAGNOSIS — R519 Headache, unspecified: Secondary | ICD-10-CM | POA: Diagnosis not present

## 2020-08-30 ENCOUNTER — Encounter: Payer: Self-pay | Admitting: Family Medicine

## 2020-08-30 ENCOUNTER — Other Ambulatory Visit: Payer: Self-pay

## 2020-08-30 ENCOUNTER — Ambulatory Visit: Payer: BC Managed Care – PPO | Admitting: Family Medicine

## 2020-08-30 VITALS — BP 124/80 | HR 85 | Temp 97.7°F | Ht 63.0 in | Wt 167.5 lb

## 2020-08-30 DIAGNOSIS — Z23 Encounter for immunization: Secondary | ICD-10-CM | POA: Diagnosis not present

## 2020-08-30 DIAGNOSIS — S46812A Strain of other muscles, fascia and tendons at shoulder and upper arm level, left arm, initial encounter: Secondary | ICD-10-CM | POA: Diagnosis not present

## 2020-08-30 DIAGNOSIS — S4362XA Sprain of left sternoclavicular joint, initial encounter: Secondary | ICD-10-CM | POA: Diagnosis not present
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
## 2020-09-05 ENCOUNTER — Other Ambulatory Visit: Payer: Self-pay | Admitting: Family Medicine

## 2020-09-08 ENCOUNTER — Other Ambulatory Visit: Payer: Self-pay | Admitting: Internal Medicine

## 2020-09-21 ENCOUNTER — Telehealth: Payer: Self-pay | Admitting: Family Medicine

## 2020-09-21 DIAGNOSIS — E7800 Pure hypercholesterolemia, unspecified: Secondary | ICD-10-CM

## 2020-09-21 DIAGNOSIS — I10 Essential (primary) hypertension: Secondary | ICD-10-CM

## 2020-09-21 DIAGNOSIS — R7303 Prediabetes: Secondary | ICD-10-CM

## 2020-09-21 NOTE — Telephone Encounter (Signed)
-----   Message from Cloyd Stagers, RT sent at 09/04/2020  4:51 PM EST ----- Regarding: Lab Orders for Friday 1.28.2022 Please place lab orders for Friday 1.28.2022, office visit for physical on Friday 2.4.2022 Thank you, Dyke Maes RT(R)

## 2020-09-22 ENCOUNTER — Other Ambulatory Visit (INDEPENDENT_AMBULATORY_CARE_PROVIDER_SITE_OTHER): Payer: BC Managed Care – PPO

## 2020-09-22 ENCOUNTER — Other Ambulatory Visit: Payer: Self-pay

## 2020-09-22 DIAGNOSIS — I10 Essential (primary) hypertension: Secondary | ICD-10-CM

## 2020-09-22 DIAGNOSIS — E7800 Pure hypercholesterolemia, unspecified: Secondary | ICD-10-CM

## 2020-09-22 DIAGNOSIS — R7303 Prediabetes: Secondary | ICD-10-CM | POA: Diagnosis not present

## 2020-09-22 LAB — COMPREHENSIVE METABOLIC PANEL
ALT: 16 U/L (ref 0–35)
AST: 14 U/L (ref 0–37)
Albumin: 4.1 g/dL (ref 3.5–5.2)
Alkaline Phosphatase: 73 U/L (ref 39–117)
BUN: 14 mg/dL (ref 6–23)
CO2: 30 mEq/L (ref 19–32)
Calcium: 9.7 mg/dL (ref 8.4–10.5)
Chloride: 104 mEq/L (ref 96–112)
Creatinine, Ser: 0.6 mg/dL (ref 0.40–1.20)
GFR: 97.86 mL/min (ref >60.00)
Glucose, Bld: 96 mg/dL (ref 70–99)
Potassium: 4.3 mEq/L (ref 3.5–5.1)
Sodium: 140 mEq/L (ref 135–145)
Total Bilirubin: 0.6 mg/dL (ref 0.2–1.2)
Total Protein: 6.2 g/dL (ref 6.0–8.3)

## 2020-09-22 LAB — CBC WITH DIFFERENTIAL/PLATELET
Basophils Absolute: 0 10*3/uL (ref 0.0–0.1)
Basophils Relative: 0.5 % (ref 0.0–3.0)
Eosinophils Absolute: 0.1 10*3/uL (ref 0.0–0.7)
Eosinophils Relative: 0.8 % (ref 0.0–5.0)
HCT: 43.2 % (ref 36.0–46.0)
Hemoglobin: 14.6 g/dL (ref 12.0–15.0)
Lymphocytes Relative: 28.6 % (ref 12.0–46.0)
Lymphs Abs: 2.7 10*3/uL (ref 0.7–4.0)
MCHC: 33.8 g/dL (ref 30.0–36.0)
MCV: 97.3 fl (ref 78.0–100.0)
Monocytes Absolute: 0.8 10*3/uL (ref 0.1–1.0)
Monocytes Relative: 8.8 % (ref 3.0–12.0)
Neutro Abs: 5.8 10*3/uL (ref 1.4–7.7)
Neutrophils Relative %: 61.3 % (ref 43.0–77.0)
Platelets: 211 10*3/uL (ref 150.0–400.0)
RBC: 4.44 Mil/uL (ref 3.87–5.11)
RDW: 13.1 % (ref 11.5–15.5)
WBC: 9.4 10*3/uL (ref 4.0–10.5)

## 2020-09-22 LAB — LIPID PANEL
Cholesterol: 150 mg/dL (ref 0–200)
HDL: 47.8 mg/dL (ref >39.00)
LDL Cholesterol: 84 mg/dL (ref 0–99)
NonHDL: 102.48
Total CHOL/HDL Ratio: 3
Triglycerides: 94 mg/dL (ref 0.0–149.0)
VLDL: 18.8 mg/dL (ref 0.0–40.0)

## 2020-09-22 LAB — HEMOGLOBIN A1C: Hgb A1c MFr Bld: 5.8 % (ref 4.6–6.5)

## 2020-09-22 LAB — TSH: TSH: 2.74 u[IU]/mL (ref 0.35–4.50)

## 2020-09-29 ENCOUNTER — Encounter: Payer: Self-pay | Admitting: Family Medicine

## 2020-09-29 ENCOUNTER — Other Ambulatory Visit: Payer: Self-pay

## 2020-09-29 ENCOUNTER — Ambulatory Visit (INDEPENDENT_AMBULATORY_CARE_PROVIDER_SITE_OTHER): Payer: BC Managed Care – PPO | Admitting: Family Medicine

## 2020-09-29 VITALS — BP 132/80 | HR 83 | Temp 96.9°F | Ht 62.25 in | Wt 166.4 lb

## 2020-09-29 DIAGNOSIS — Z0000 Encounter for general adult medical examination without abnormal findings: Secondary | ICD-10-CM

## 2020-09-29 DIAGNOSIS — F17200 Nicotine dependence, unspecified, uncomplicated: Secondary | ICD-10-CM | POA: Diagnosis not present

## 2020-09-29 DIAGNOSIS — R7303 Prediabetes: Secondary | ICD-10-CM

## 2020-09-29 DIAGNOSIS — E7800 Pure hypercholesterolemia, unspecified: Secondary | ICD-10-CM

## 2020-09-29 DIAGNOSIS — I10 Essential (primary) hypertension: Secondary | ICD-10-CM | POA: Diagnosis not present

## 2020-09-29 DIAGNOSIS — M8589 Other specified disorders of bone density and structure, multiple sites: Secondary | ICD-10-CM

## 2020-09-29 DIAGNOSIS — G620 Drug-induced polyneuropathy: Secondary | ICD-10-CM

## 2020-09-29 MED ORDER — ATORVASTATIN CALCIUM 10 MG PO TABS: 10.0000 mg | ORAL_TABLET | Freq: Every day | ORAL | 3 refills | Status: DC

## 2020-09-29 NOTE — Patient Instructions (Addendum)
Make sure to schedule your covid booster   Try to get 1200-1500 mg of calcium per day with at least 1000 iu of vitamin D - for bone health   You can get corn pads over the counter for feet  Wider shoes can help  If you want to see a podiatrist (foot doctor) let us know   Keep thinking about quitting smoking   Eat a healthy diet  Try to get most of your carbohydrates from produce (with the exception of white potatoes)  Eat less bread/pasta/rice/snack foods/cereals/sweets and other items from the middle of the grocery store (processed carbs)

## 2020-09-29 NOTE — Assessment & Plan Note (Signed)
No clinical changes 

## 2020-09-29 NOTE — Assessment & Plan Note (Signed)
Disc goals for lipids and reasons to control them Rev last labs with pt Rev low sat fat diet in detail  Fair control with LDL of 84 Plan to continue atorvastatin 10 mg daily
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
## 2020-09-29 NOTE — Assessment & Plan Note (Signed)
Reviewed health habits including diet and exercise and skin cancer prevention Reviewed appropriate screening tests for age  Also reviewed health mt list, fam hx and immunization status , as well as social and family history   See HPI Labs reviewed  Smoking cessation enc dexa utd, enc compliance with ca and D  Breast and colon cancer screen utd (with personal h/o breast cancer) imms utd- enc a covid booster and she plans to get this

## 2020-09-29 NOTE — Assessment & Plan Note (Signed)
bp in fair control at this time  BP Readings from Last 1 Encounters:  09/29/20 132/80   No changes needed-no medication currently Most recent labs reviewed  Disc lifstyle change with low sodium diet and exercise

## 2020-09-29 NOTE — Assessment & Plan Note (Signed)
dexa 12/20 rev Smoker  No falls or fractures  Needs better compliance with ca and D Also more exercise as tolerated

## 2020-09-29 NOTE — Assessment & Plan Note (Signed)
Disc in detail risks of smoking and possible outcomes including copd, vascular/ heart disease, cancer , respiratory and sinus infections  Pt voices understanding Pt does not feel ready to quit at this time No breathing problems

## 2020-09-29 NOTE — Assessment & Plan Note (Signed)
Lab Results  Component Value Date   HGBA1C 5.8 09/22/2020   disc imp of low glycemic diet and wt loss to prevent DM2

## 2020-12-04 ENCOUNTER — Other Ambulatory Visit: Payer: Self-pay | Admitting: Family Medicine

## 2020-12-07 ENCOUNTER — Other Ambulatory Visit: Payer: Self-pay | Admitting: Internal Medicine
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
## 2021-01-24 HISTORY — PX: CHOLECYSTECTOMY: SHX55

## 2021-01-27 ENCOUNTER — Inpatient Hospital Stay
Admission: EM | Admit: 2021-01-27 | Discharge: 2021-02-02 | DRG: 419 | Disposition: A | Discharge status: Home / Self Care | Payer: BC Managed Care – PPO | Attending: Internal Medicine | Admitting: Internal Medicine

## 2021-01-27 ENCOUNTER — Emergency Department: Payer: BC Managed Care – PPO

## 2021-01-27 ENCOUNTER — Encounter: Payer: Self-pay | Admitting: Emergency Medicine

## 2021-01-27 ENCOUNTER — Other Ambulatory Visit: Payer: Self-pay

## 2021-01-27 DIAGNOSIS — G8929 Other chronic pain: Secondary | ICD-10-CM | POA: Diagnosis present

## 2021-01-27 DIAGNOSIS — Z823 Family history of stroke: Secondary | ICD-10-CM

## 2021-01-27 DIAGNOSIS — Z981 Arthrodesis status: Secondary | ICD-10-CM

## 2021-01-27 DIAGNOSIS — I10 Essential (primary) hypertension: Secondary | ICD-10-CM | POA: Diagnosis not present

## 2021-01-27 DIAGNOSIS — F32A Depression, unspecified: Secondary | ICD-10-CM | POA: Diagnosis present

## 2021-01-27 DIAGNOSIS — F17210 Nicotine dependence, cigarettes, uncomplicated: Secondary | ICD-10-CM | POA: Diagnosis not present

## 2021-01-27 DIAGNOSIS — Z923 Personal history of irradiation: Secondary | ICD-10-CM | POA: Diagnosis not present

## 2021-01-27 DIAGNOSIS — F419 Anxiety disorder, unspecified: Secondary | ICD-10-CM | POA: Diagnosis not present

## 2021-01-27 DIAGNOSIS — K2270 Barrett's esophagus without dysplasia: Secondary | ICD-10-CM | POA: Diagnosis present

## 2021-01-27 DIAGNOSIS — K8042 Calculus of bile duct with acute cholecystitis without obstruction: Secondary | ICD-10-CM | POA: Diagnosis not present

## 2021-01-27 DIAGNOSIS — K8020 Calculus of gallbladder without cholecystitis without obstruction: Secondary | ICD-10-CM | POA: Diagnosis not present

## 2021-01-27 DIAGNOSIS — Z888 Allergy status to other drugs, medicaments and biological substances status: Secondary | ICD-10-CM

## 2021-01-27 DIAGNOSIS — K828 Other specified diseases of gallbladder: Secondary | ICD-10-CM | POA: Diagnosis present

## 2021-01-27 DIAGNOSIS — H409 Unspecified glaucoma: Secondary | ICD-10-CM | POA: Diagnosis present

## 2021-01-27 DIAGNOSIS — M549 Dorsalgia, unspecified: Secondary | ICD-10-CM

## 2021-01-27 DIAGNOSIS — E785 Hyperlipidemia, unspecified: Secondary | ICD-10-CM | POA: Diagnosis present

## 2021-01-27 DIAGNOSIS — Z853 Personal history of malignant neoplasm of breast: Secondary | ICD-10-CM | POA: Diagnosis not present

## 2021-01-27 DIAGNOSIS — E7800 Pure hypercholesterolemia, unspecified: Secondary | ICD-10-CM | POA: Diagnosis present

## 2021-01-27 DIAGNOSIS — Z8249 Family history of ischemic heart disease and other diseases of the circulatory system: Secondary | ICD-10-CM

## 2021-01-27 DIAGNOSIS — B009 Herpesviral infection, unspecified: Secondary | ICD-10-CM | POA: Diagnosis not present

## 2021-01-27 DIAGNOSIS — K810 Acute cholecystitis: Secondary | ICD-10-CM | POA: Insufficient documentation

## 2021-01-27 DIAGNOSIS — K219 Gastro-esophageal reflux disease without esophagitis: Secondary | ICD-10-CM | POA: Diagnosis not present

## 2021-01-27 DIAGNOSIS — M4800 Spinal stenosis, site unspecified: Secondary | ICD-10-CM | POA: Diagnosis present

## 2021-01-27 DIAGNOSIS — K8050 Calculus of bile duct without cholangitis or cholecystitis without obstruction: Secondary | ICD-10-CM | POA: Diagnosis not present

## 2021-01-27 DIAGNOSIS — Z818 Family history of other mental and behavioral disorders: Secondary | ICD-10-CM

## 2021-01-27 DIAGNOSIS — Z9013 Acquired absence of bilateral breasts and nipples: Secondary | ICD-10-CM

## 2021-01-27 DIAGNOSIS — K8062 Calculus of gallbladder and bile duct with acute cholecystitis without obstruction: Secondary | ICD-10-CM | POA: Diagnosis not present

## 2021-01-27 DIAGNOSIS — Z20822 Contact with and (suspected) exposure to covid-19: Secondary | ICD-10-CM | POA: Diagnosis not present

## 2021-01-27 DIAGNOSIS — F341 Dysthymic disorder: Secondary | ICD-10-CM | POA: Diagnosis present

## 2021-01-27 DIAGNOSIS — E876 Hypokalemia: Secondary | ICD-10-CM | POA: Diagnosis not present

## 2021-01-27 DIAGNOSIS — K8064 Calculus of gallbladder and bile duct with chronic cholecystitis without obstruction: Secondary | ICD-10-CM | POA: Diagnosis not present

## 2021-01-27 DIAGNOSIS — K760 Fatty (change of) liver, not elsewhere classified: Secondary | ICD-10-CM | POA: Diagnosis present

## 2021-01-27 DIAGNOSIS — Z8673 Personal history of transient ischemic attack (TIA), and cerebral infarction without residual deficits: Secondary | ICD-10-CM | POA: Diagnosis not present

## 2021-01-27 DIAGNOSIS — Z9221 Personal history of antineoplastic chemotherapy: Secondary | ICD-10-CM

## 2021-01-27 DIAGNOSIS — R748 Abnormal levels of other serum enzymes: Secondary | ICD-10-CM | POA: Diagnosis not present

## 2021-01-27 DIAGNOSIS — Z79899 Other long term (current) drug therapy: Secondary | ICD-10-CM

## 2021-01-27 DIAGNOSIS — Z7982 Long term (current) use of aspirin: Secondary | ICD-10-CM

## 2021-01-27 LAB — COMPREHENSIVE METABOLIC PANEL
ALT: 185 U/L — ABNORMAL HIGH (ref 0–44)
AST: 128 U/L — ABNORMAL HIGH (ref 15–41)
Albumin: 3.7 g/dL (ref 3.5–5.0)
Alkaline Phosphatase: 435 U/L — ABNORMAL HIGH (ref 38–126)
Anion gap: 10 (ref 5–15)
BUN: 12 mg/dL (ref 6–20)
CO2: 24 mmol/L (ref 22–32)
Calcium: 9.1 mg/dL (ref 8.9–10.3)
Chloride: 107 mmol/L (ref 98–111)
Creatinine, Ser: 0.53 mg/dL (ref 0.44–1.00)
GFR, Estimated: >60 mL/min (ref >60)
Glucose, Bld: 147 mg/dL — ABNORMAL HIGH (ref 70–99)
Potassium: 3.3 mmol/L — ABNORMAL LOW (ref 3.5–5.1)
Sodium: 141 mmol/L (ref 135–145)
Total Bilirubin: 3.8 mg/dL — ABNORMAL HIGH (ref 0.3–1.2)
Total Protein: 6.6 g/dL (ref 6.5–8.1)

## 2021-01-27 LAB — URINALYSIS, COMPLETE (UACMP) WITH MICROSCOPIC
Bacteria, UA: NONE SEEN
Glucose, UA: NEGATIVE mg/dL
Hgb urine dipstick: NEGATIVE
Ketones, ur: 5 mg/dL — AB
Nitrite: NEGATIVE
Protein, ur: NEGATIVE mg/dL
Specific Gravity, Urine: 1.02 (ref 1.005–1.030)
pH: 5 (ref 5.0–8.0)

## 2021-01-27 LAB — MAGNESIUM: Magnesium: 1.8 mg/dL (ref 1.7–2.4)

## 2021-01-27 LAB — CBC
HCT: 37.2 % (ref 36.0–46.0)
Hemoglobin: 12.9 g/dL (ref 12.0–15.0)
MCH: 33.2 pg (ref 26.0–34.0)
MCHC: 34.7 g/dL (ref 30.0–36.0)
MCV: 95.9 fL (ref 80.0–100.0)
Platelets: 264 10*3/uL (ref 150–400)
RBC: 3.88 MIL/uL (ref 3.87–5.11)
RDW: 13.7 % (ref 11.5–15.5)
WBC: 11.2 10*3/uL — ABNORMAL HIGH (ref 4.0–10.5)
nRBC: 0 % (ref 0.0–0.2)

## 2021-01-27 LAB — LIPASE, BLOOD: Lipase: 39 U/L (ref 11–51)

## 2021-01-27 LAB — BILIRUBIN, DIRECT: Bilirubin, Direct: 2.5 mg/dL — ABNORMAL HIGH (ref 0.0–0.2)

## 2021-01-27 MED ORDER — KETOROLAC TROMETHAMINE 30 MG/ML IJ SOLN
15.0000 mg | INTRAMUSCULAR | Status: AC
  Administered 2021-01-27: 15 mg via INTRAVENOUS
  Filled 2021-01-27: qty 1

## 2021-01-27 MED ORDER — KETOROLAC TROMETHAMINE 30 MG/ML IJ SOLN
15.0000 mg | Freq: Three times a day (TID) | INTRAMUSCULAR | Status: AC | PRN
Start: 2021-01-27 — End: 2021-01-29

## 2021-01-27 MED ORDER — ACETAMINOPHEN 325 MG PO TABS
650.0000 mg | ORAL_TABLET | Freq: Four times a day (QID) | ORAL | Status: DC | PRN
  Administered 2021-01-29 – 2021-02-02 (×3): 650 mg via ORAL
  Filled 2021-01-27 (×2): qty 2

## 2021-01-27 MED ORDER — HEPARIN SODIUM (PORCINE) 5000 UNIT/ML IJ SOLN
5000.0000 [IU] | Freq: Three times a day (TID) | INTRAMUSCULAR | Status: DC
  Administered 2021-01-27 – 2021-01-28 (×2): 5000 [IU] via SUBCUTANEOUS
  Filled 2021-01-27 (×2): qty 1

## 2021-01-27 MED ORDER — POTASSIUM CHLORIDE 10 MEQ/100ML IV SOLN
10.0000 meq | INTRAVENOUS | Status: AC
  Administered 2021-01-27 – 2021-01-28 (×3): 10 meq via INTRAVENOUS
  Filled 2021-01-27 (×3): qty 100

## 2021-01-27 MED ORDER — ONDANSETRON HCL 4 MG/2ML IJ SOLN
4.0000 mg | Freq: Once | INTRAMUSCULAR | Status: AC
  Administered 2021-01-27: 4 mg via INTRAVENOUS
  Filled 2021-01-27: qty 2

## 2021-01-27 MED ORDER — ACETAMINOPHEN 650 MG RE SUPP: 650.0000 mg | Freq: Four times a day (QID) | RECTAL | Status: DC | PRN

## 2021-01-27 MED ORDER — PIPERACILLIN-TAZOBACTAM 3.375 G IVPB
3.3750 g | Freq: Three times a day (TID) | INTRAVENOUS | Status: DC
  Administered 2021-01-27 – 2021-02-02 (×17): 3.375 g via INTRAVENOUS
  Filled 2021-01-27 (×15): qty 50

## 2021-01-27 MED ORDER — SODIUM CHLORIDE 0.9% FLUSH
3.0000 mL | Freq: Two times a day (BID) | INTRAVENOUS | Status: DC
  Administered 2021-01-27 – 2021-02-01 (×6): 3 mL via INTRAVENOUS

## 2021-01-27 MED ORDER — NICOTINE 21 MG/24HR TD PT24
21.0000 mg | MEDICATED_PATCH | Freq: Every day | TRANSDERMAL | Status: DC
  Administered 2021-01-27 – 2021-02-02 (×7): 21 mg via TRANSDERMAL
  Filled 2021-01-27 (×7): qty 1

## 2021-01-27 MED ORDER — SODIUM CHLORIDE 0.9 % IV SOLN: INTRAVENOUS | Status: DC

## 2021-01-27 MED ORDER — PIPERACILLIN-TAZOBACTAM 3.375 G IVPB 30 MIN
3.3750 g | Freq: Once | INTRAVENOUS | Status: AC
  Administered 2021-01-27: 3.375 g via INTRAVENOUS
  Filled 2021-01-27: qty 50

## 2021-01-27 NOTE — ED Notes (Signed)
Trilby Drummer, MD at bedside.

## 2021-01-27 NOTE — ED Triage Notes (Signed)
Pt reports for the last several months she has had intermittent epigastric pain that radiates around to her right side. Pt describes the pain as crampy and sharp and feels nauseated at times.

## 2021-01-27 NOTE — H&P (Signed)
History and Physical   GOLDEN EMILE MPN:[id number] DOB: 1961/07/09 DOA: 01/27/2021  PCP: Abner Greenspan, MD   Patient coming from: Home  Chief Complaint: Abdominal pain  HPI: Amy Leonard is a 60 y.o. female with medical history significant of anxiety, depression, back pain, breast cancer, hypertension, CVA, lymphedema, hyperlipidemia, GERD, migraine, uses dentures who presents with ongoing abdominal pain.  Patient states that she has intermittent abdominal pain for about the past 3 months.  This pain as above comes and goes and it will radiate to her right upper quadrant from her epigastrium.  Pain described as a crampy and sharp pain with some associated nausea.  She states the pain is worse with eating.  She states that it would tend to feel better after vomiting.  The pain had been occurring about once a week until 2 weeks ago and it has been 2-3 times a week since then.  She also left work early twice this week.  She told her daughter who is a nurse about the pain earlier today who recommended she come to the ED for further evaluation.  She denies fevers, chills, chest pain, shortness of breath, constipation, diarrhea.  ED Course: Vital signs in the ED are stable.  Lab work-up shows CMP significant for potassium 3.3, glucose 147, AST 128, ALT 185, ALP 435, T bili 3.8 with T bili of 2.5.  CBC showed leukocytosis to 11.2.  Lipase normal.  Urinalysis showed bili, trace ketones, leukocytes.  Right upper quadrant ultrasound showed dilated common bile duct at 19 mm, obstruction with stones in the bile ducts with sludge as well.  Thickening gallbladder wall with edema and stones within the gallbladder.  Patient given a dose of Zosyn in the ED.  GI and general surgery were consulted general surgery says that he can address the cholecystitis once the choledocholithiasis has been addressed.  GI states that he should be will do ERCP on Monday provided patient states stable.  If patient develops  sepsis or cholangitis she may need transfer to [facility].  Review of Systems: As per HPI otherwise all other systems reviewed and are negative.  Past Medical History:  Diagnosis Date  . Anxiety   . Anxiety and depression   . Barrett's esophageal ulceration   . 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 Breast cancer ([place]) 2014   BILATERAL LUMPECTOMY  . Carpal tunnel syndrome   . Chronic back pain    spinal stenosis  . Depression   . Elevated WBC count    nl diff  . Erosive gastritis   . Folliculitis 0/0/8676  . GERD (gastroesophageal reflux disease)   . Glaucoma    ?  Marland Kitchen History of chemotherapy 2014   BREAST CA  . HLD (hyperlipidemia)   . HSV infection    recurrent (side and buttox)  . Hypertension   . Migraine   . Personal history of chemotherapy 2014   bilateral breast ca  . Personal history of radiation therapy 2014   bilateral breast ca  . Radiation 2014   BREAST CA  . Tobacco abuse   . Wears dentures    top    Past Surgical History:  Procedure Laterality Date  . 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 BREAST LUMPECTOMY Bilateral 2014   bilateral breast ca  . BTL    . COLONOSCOPY    . epidural steroid injection  2008  . LUMBAR FUSION  03/2016   L4-5  . PARTIAL MASTECTOMY WITH NEEDLE LOCALIZATION AND AXILLARY SENTINEL LYMPH NODE BX Bilateral 11/23/2012   Procedure: PARTIAL MASTECTOMY WITH NEEDLE LOCALIZATION AND AXILLARY SENTINEL LYMPH NODE BX;  Surgeon: Haywood M Ingram, MD;  Location: [facility];  Service: General;  Laterality: Bilateral;  . PORT-A-CATH REMOVAL Right 08/25/2013   Procedure: REMOVAL PORT-A-CATH;  Surgeon: Haywood M Ingram, MD;  Location: [facility];  Service: General;  Laterality: Right;  . PORTACATH PLACEMENT Right 11/23/2012   Procedure: INSERTION PORT-A-CATH;  Surgeon: Haywood M Ingram, MD;  Location: [facility];  Service: General;  Laterality:  Right;  . UPPER GI ENDOSCOPY      Social History  reports that she has been smoking cigarettes. She has a 16.50 pack-year smoking history. She has never used smokeless tobacco. She reports current alcohol use. She reports that she does not use drugs.  Allergies  Allergen Reactions  . Gabapentin Swelling  . Lyrica [Pregabalin] Other (See Comments)    sedated  . Voltaren [Diclofenac Sodium] Nausea And Vomiting  . Bupropion Hcl Nausea Only and Other (See Comments)     GI, sleepy  . Chlorhexidine Gluconate Itching and Rash  . Hydrocod Polst-Cpm Polst Er Nausea And Vomiting    vomiting  . Lisinopril Other (See Comments)    Leg cramps    . Naproxen Sodium Other (See Comments)    does not tolerate    Family History  Problem Relation Age of Onset  . Stroke Maternal Grandmother   . Diabetes Maternal Grandmother   . Diabetes Mother   . Hypertension Mother   . Leukemia Mother   . Obesity Mother   . Depression Sister   . Bipolar disorder Son        Bipolar / oppositional defiant  . Alcohol abuse Sister   . Drug abuse Sister   . Alcohol abuse Sister   . Drug abuse Sister   . Alcohol abuse Brother   . Drug abuse Brother   . Pancreatic cancer Brother   . Colon cancer Neg Hx   . Breast cancer Neg Hx   Reviewed on admission  Prior to Admission medications   Medication Sig Start Date End Date Taking? Authorizing Provider  Acetaminophen (TYLENOL ARTHRITIS PAIN PO) Take 2 tablets by mouth as needed (pain).     [provider]  anastrozole (ARIMIDEX) 1 MG tablet TAKE 1 TABLET DAILY 12/07/20   Brahmanday, Govinda R, MD  aspirin 81 MG chewable tablet Chew 1 tablet (81 mg total) by mouth daily. 02/10/15   Tower, Marne A, MD  atorvastatin (LIPITOR) 10 MG tablet Take 1 tablet (10 mg total) by mouth daily. 09/29/20   Tower, Marne A, MD  b complex vitamins tablet Take 1 tablet by mouth daily.    [provider]  calcium carbonate (TUMS - DOSED IN MG ELEMENTAL CALCIUM) 500 MG  chewable tablet Chew by mouth as directed.    [provider]  Calcium Carbonate-Vitamin D (CALCIUM + D PO) Take 1 tablet by mouth daily.    [provider]  famotidine (PEPCID) 20 MG tablet Take 20 mg by mouth 2 (two) times daily. Patient not taking: Reported on 09/29/2020    [provider]  omeprazole (PRILOSEC) 40 MG capsule Take 1 capsule (40 mg total) by mouth daily. 02/03/15   Stark, Malcolm T, MD  traMADol (ULTRAM) 50 MG tablet Take 1 tablet (50 mg total) by mouth every 6 (six) hours as needed   for moderate pain. 01/19/20   Sable Feil, PA-C    Physical Exam: Vitals:   01/27/21 1700 01/27/21 1730 01/27/21 1745 01/27/21 1830  BP: 132/72 133/75  136/80  Pulse: 70 96  89  Resp: _0 Temp:   98.5 F (36.9 C)   TempSrc:   Oral   SpO2: 98% 91%  96%  Weight:      Height:       Physical Exam Constitutional:      General: She is not in acute distress.    Appearance: Normal appearance.  HENT:     Head: Normocephalic and atraumatic.     Mouth/Throat:     Mouth: Mucous membranes are moist.     Pharynx: Oropharynx is clear.  Eyes:     Extraocular Movements: Extraocular movements intact.     Pupils: Pupils are equal, round, and reactive to light.  Cardiovascular:     Rate and Rhythm: Normal rate and regular rhythm.     Pulses: Normal pulses.     Heart sounds: Normal heart sounds.  Pulmonary:     Effort: Pulmonary effort is normal. No respiratory distress.     Breath sounds: Normal breath sounds.  Abdominal:     General: Bowel sounds are normal. There is no distension.     Palpations: Abdomen is soft.     Tenderness: There is abdominal tenderness in the right upper quadrant and epigastric area.  Musculoskeletal:        General: No swelling or deformity.  Skin:    General: Skin is warm and dry.  Neurological:     General: No focal deficit present.     Mental Status: Mental status is at baseline.    Labs on Admission: I have personally  reviewed following labs and imaging studies  CBC: Recent Labs  Lab 01/27/21 1525  WBC 11.2*  HGB 12.9  HCT 37.2  MCV 95.9  PLT 536    Basic Metabolic Panel: Recent Labs  Lab 01/27/21 1525  NA 141  K 3.3*  CL 107  CO2 24  GLUCOSE 147*  BUN 12  CREATININE 0.53  CALCIUM 9.1    GFR: Estimated Creatinine Clearance: 73 mL/min (by C-G formula based on SCr of 0.53 mg/dL).  Liver Function Tests: Recent Labs  Lab 01/27/21 1525  AST 128*  ALT 185*  ALKPHOS 435*  BILITOT 3.8*  PROT 6.6  ALBUMIN 3.7    Urine analysis:    Component Value Date/Time   COLORURINE AMBER (A) 01/27/2021 1526   APPEARANCEUR HAZY (A) 01/27/2021 1526   LABSPEC 1.020 01/27/2021 1526   PHURINE 5.0 01/27/2021 1526   GLUCOSEU NEGATIVE 01/27/2021 1526   HGBUR NEGATIVE 01/27/2021 1526   BILIRUBINUR SMALL (A) 01/27/2021 1526   BILIRUBINUR neg 02/02/2014 1803   KETONESUR 5 (A) 01/27/2021 1526   PROTEINUR NEGATIVE 01/27/2021 1526   UROBILINOGEN 0.2 02/02/2014 1803   UROBILINOGEN 0.2 01/05/2013 2251   NITRITE NEGATIVE 01/27/2021 1526   LEUKOCYTESUR MODERATE (A) 01/27/2021 1526    Radiological Exams on Admission: US ABDOMEN LIMITED RUQ (LIVER/GB)  Result Date: 01/27/2021 CLINICAL DATA:  RIGHT upper quadrant pain, elevated liver function test. Back pain today. EXAM: ULTRASOUND ABDOMEN LIMITED RIGHT UPPER QUADRANT COMPARISON:  None. FINDINGS: Gallbladder: Gallbladder wall is thickened and edematous, measuring up to 7 mm thickness. Multiple mobile stones within the gallbladder fundus region, largest measuring 6 mm. Common bile duct: Diameter: 19 mm. Multiple obstructing stones are seen within the markedly distended common bile  duct, and probable adjacent sludge. Liver: No focal lesion identified. Liver is diffusely echogenic indicating fatty infiltration. Portal vein is patent on color Doppler imaging with normal direction of blood flow towards the liver. Other: None. IMPRESSION: 1. Common bile duct is  markedly distended, measuring 19 mm diameter, with multiple obstructing stones and sludge within the mid-to-distal CBD. 2. Gallbladder walls are thickened/edematous suggesting associated acute cholecystitis. 3. Cholelithiasis. 4. Fatty infiltration of the liver. Electronically Signed   By: Franki Cabot M.D.   On: 01/27/2021 17:24   EKG: Independently reviewed.  Normal sinus rhythm at 94 bpm.  Some nonspecific T wave changes in anterior and lateral leads with flattening.  Assessment/Plan Principal Problem:   Choledocholithiasis with acute cholecystitis Active Problems:   Pure hypercholesterolemia   ANXIETY DEPRESSION   Essential hypertension   Chronic back pain   History of CVA (cerebrovascular accident)  Choledocholithiasis with acute cholecystitis > 3 months of intermittent abdominal pain in the epigastrium and right upper quadrant with associated nausea and worse with eating. > Right upper quadrant ultrasound in the ED demonstrating CBD 19 mm, obstructing stones, thickened gallbladder wall with edema, gallstones > Patient started on Zosyn in the ED.   > GI and general surgery were consulted. General surgery will address the cholecystitis once the choledocholithiasis has been addressed.  GI states that they should be able to do ERCP on Monday provided patient states stable.  If patient develops sepsis or cholangitis she may need transfer to [facility]. - Monitor on telemetry  - Appreciate general surgery and GI recommendations - Continue with Zosyn - N.p.o. >> ice chips (per patient request) - Continuous maintenance IV fluids - Toradol as needed x2 doses for now.  She states that she does not tolerate strong pain medicines very well and she is not having significant pain at this time.  Hypokalemia > Potassium noted to be 3.3 in the ED - Replete with 10 mEq IV x3 given current n.p.o. status - Check magnesium - Trend renal function and electrolytes  History of CVA Hyperlipidemia -  Holding home aspirin and statin for now  GERD - Holding home meds for now  Back pain - Holding home as needed tramadol for now  History of breast cancer > 2014, currently in remission - Still on anastrozole per chart review and daughter, will hold for now   DVT prophylaxis: Heparin  Code Status:   Full  Family Communication:  Updated daughter by phone.  Of note, her daughter is a Marine scientist and works here in the [place] center. Disposition Plan:   Patient is from:  Home  Anticipated DC to:  Home  Anticipated DC date:  2 to 5 days  Anticipated DC barriers: None  Consults called:  GI and general surgery consulted in the ED Admission status:  Inpatient, telemetry   Severity of Illness: The appropriate patient status for this patient is INPATIENT. Inpatient status is judged to be reasonable and necessary in order to provide the required intensity of service to ensure the patient's safety. The patient's presenting symptoms, physical exam findings, and initial radiographic and laboratory data in the context of their chronic comorbidities is felt to place them at high risk for further clinical deterioration. Furthermore, it is not anticipated that the patient will be medically stable for discharge from the hospital within 2 midnights of admission. The following factors support the patient status of inpatient.   " The patient's presenting symptoms include abdominal pain, nausea. " The worrisome physical exam  findings include right upper quadrant pain. " The initial radiographic and laboratory data are worrisome because of  right upper quadrant ultrasound with dilated common bile duct with obstructive stones and gallbladder with wall thickening and wall edema with stones, T bili 3.8, alk phos 435, ALT 185, AST 128, potassium 3.3, leukocytosis to 11.2. " The chronic co-morbidities include anxiety, depression, back pain, history of breast cancer, history CVA, hypertension, hyperlipidemia, GERD.   * I  certify that at the point of admission it is my clinical judgment that the patient will require inpatient hospital care spanning beyond 2 midnights from the point of admission due to high intensity of service, high risk for further deterioration and high frequency of surveillance required.Marcelyn Bruins MD [facility]  How to contact the [facility] Attending or Consulting provider [place] or covering provider during after hours [place], for this patient?   1. Check the care team in [facility] and look for a) attending/consulting [facility] provider listed and b) the [facility] team listed 2. Log into [email or web address] and use [place]'s universal password to access. If you do not have the password, please contact the hospital operator. 3. Locate the [facility] provider you are looking for under [facility] and page to a number that you can be directly reached. 4. If you still have difficulty reaching the provider, please page the [facility] (Director on Call) for the Hospitalists listed on amion for assistance.  01/27/2021, 6:44 PM
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
## 2021-01-27 NOTE — Consult Note (Signed)
Pharmacy Antibiotic Note  Amy Leonard is a 60 y.o. female admitted on 01/27/2021 with ongoing abdominal pain x 3 months. Imagaing concerning for acute cholecystitis with cholelithiasis.  Pharmacy has been consulted for Zosyn dosing for intra-abdominal infection.   Plan: Zosyn 3.375g IV q8h (4 hour infusion).  Height: 5\' 3"  (160 cm) Weight: 76 kg (167 lb 8.8 oz) IBW/kg (Calculated) : 52.4  Temp (24hrs), Avg:98.5 F (36.9 C), Min:98.5 F (36.9 C), Max:98.5 F (36.9 C)  Recent Labs  Lab 01/27/21 1525  WBC 11.2*  CREATININE 0.53    Estimated Creatinine Clearance: 73 mL/min (by C-G formula based on SCr of 0.53 mg/dL).    Allergies  Allergen Reactions  . Gabapentin Swelling  . Lyrica [Pregabalin] Other (See Comments)    sedated  . Voltaren [Diclofenac Sodium] Nausea And Vomiting  . Bupropion Hcl Nausea Only and Other (See Comments)     GI, sleepy  . Chlorhexidine Gluconate Itching and Rash  . Hydrocod Polst-Cpm Polst Er Nausea And Vomiting    vomiting  . Lisinopril Other (See Comments)    Leg cramps    . Naproxen Sodium Other (See Comments)    does not tolerate    Antimicrobials this admission: 6/4 zosyn >>   Dose adjustments this admission: n/a  Microbiology results:  Thank you for allowing pharmacy to be a part of this patient's care.  Dorothe Pea, PharmD, BCPS Clinical Pharmacist  01/27/2021 6:46 PM

## 2021-01-27 NOTE — ED Notes (Signed)
Lindsay RN aware of assigned bed 

## 2021-01-27 NOTE — ED Notes (Signed)
US at bedside

## 2021-01-28 ENCOUNTER — Encounter: Payer: Self-pay | Admitting: Internal Medicine

## 2021-01-28 DIAGNOSIS — R748 Abnormal levels of other serum enzymes: Secondary | ICD-10-CM

## 2021-01-28 LAB — CBC
HCT: 34.4 % — ABNORMAL LOW (ref 36.0–46.0)
Hemoglobin: 11.9 g/dL — ABNORMAL LOW (ref 12.0–15.0)
MCH: 33.5 pg (ref 26.0–34.0)
MCHC: 34.6 g/dL (ref 30.0–36.0)
MCV: 96.9 fL (ref 80.0–100.0)
Platelets: 240 10*3/uL (ref 150–400)
RBC: 3.55 MIL/uL — ABNORMAL LOW (ref 3.87–5.11)
RDW: 13.8 % (ref 11.5–15.5)
WBC: 9.1 10*3/uL (ref 4.0–10.5)
nRBC: 0 % (ref 0.0–0.2)

## 2021-01-28 LAB — HIV ANTIBODY (ROUTINE TESTING W REFLEX): HIV Screen 4th Generation wRfx: NONREACTIVE

## 2021-01-28 LAB — COMPREHENSIVE METABOLIC PANEL
ALT: 206 U/L — ABNORMAL HIGH (ref 0–44)
AST: 156 U/L — ABNORMAL HIGH (ref 15–41)
Albumin: 3 g/dL — ABNORMAL LOW (ref 3.5–5.0)
Alkaline Phosphatase: 414 U/L — ABNORMAL HIGH (ref 38–126)
Anion gap: 6 (ref 5–15)
BUN: 11 mg/dL (ref 6–20)
CO2: 23 mmol/L (ref 22–32)
Calcium: 8.4 mg/dL — ABNORMAL LOW (ref 8.9–10.3)
Chloride: 109 mmol/L (ref 98–111)
Creatinine, Ser: 0.42 mg/dL — ABNORMAL LOW (ref 0.44–1.00)
GFR, Estimated: >60 mL/min (ref >60)
Glucose, Bld: 120 mg/dL — ABNORMAL HIGH (ref 70–99)
Potassium: 3.7 mmol/L (ref 3.5–5.1)
Sodium: 138 mmol/L (ref 135–145)
Total Bilirubin: 5.2 mg/dL — ABNORMAL HIGH (ref 0.3–1.2)
Total Protein: 5.7 g/dL — ABNORMAL LOW (ref 6.5–8.1)

## 2021-01-28 LAB — RESP PANEL BY RT-PCR (FLU A&B, COVID) ARPGX2
Influenza A by PCR: NEGATIVE
Influenza B by PCR: NEGATIVE
SARS Coronavirus 2 by RT PCR: NEGATIVE

## 2021-01-28 MED ORDER — PANTOPRAZOLE SODIUM 40 MG PO TBEC
40.0000 mg | DELAYED_RELEASE_TABLET | Freq: Every day | ORAL | Status: DC
  Administered 2021-01-28 – 2021-02-02 (×5): 40 mg via ORAL
  Filled 2021-01-28 (×5): qty 1

## 2021-01-28 MED ORDER — TRAMADOL HCL 50 MG PO TABS
50.0000 mg | ORAL_TABLET | Freq: Four times a day (QID) | ORAL | Status: DC | PRN
  Administered 2021-01-31 – 2021-02-02 (×4): 50 mg via ORAL
  Filled 2021-01-28 (×6): qty 1

## 2021-01-28 NOTE — Progress Notes (Signed)
PROGRESS NOTE    Amy Leonard  BJS:[id number] DOB: 1960/12/11 DOA: 01/27/2021 PCP: Abner Greenspan, MD  Brief Narrative:  60 y.o. female with medical history significant of anxiety, depression, back pain, breast cancer, hypertension, CVA, lymphedema, hyperlipidemia, GERD, migraine, uses dentures who presents with ongoing abdominal pain.  Patient states that she has intermittent abdominal pain for about the past 3 months.  This pain as above comes and goes and it will radiate to her right upper quadrant from her epigastrium.  Pain described as a crampy and sharp pain with some associated nausea.  She states the pain is worse with eating.  She states that it would tend to feel better after vomiting.  The pain had been occurring about once a week until 2 weeks ago and it has been 2-3 times a week since then.  She also left work early twice this week.  She told her daughter who is a nurse about the pain earlier today who recommended she come to the ED for further evaluation.  Patient had LFT elevation.  follow-up right upper quadrant ultrasound shows markedly dilated common bile duct and 43mm concerning for CBD obstruction with stones in the bile duct and gallbladder sludge.  GI and general surgery were contacted from the emergency department.  Per surgery they will not intervene until choledocholithiasis has been addressed.  Per GI plan for ERCP on Monday 6/6.  If patient develops signs of ascending cholangitis she will need emergent transfer to [facility] for advanced endoscopic intervention   Assessment & Plan:   Principal Problem:   Choledocholithiasis with acute cholecystitis Active Problems:   Pure hypercholesterolemia   ANXIETY DEPRESSION   Essential hypertension   Chronic back pain   History of CVA (cerebrovascular accident)  Choledocholithiasis with acute cholecystitis Elevated LFTs with hyperbilirubinemia Presented with 3 months of intermittent abdominal pain Associated with  nausea and vomiting, worse after eating Right upper quadrant ultrasound with CBD 19 mm and obstructive stones and gallbladder sludge Started on Zosyn in ED General surgery and GI consulted from ED General surgery will not intervene until choledocholithiasis is addressed For GI plan for ERCP on Monday 6/6 If patient develops signs of ascending cholangitis before then will need emergent transfer to [facility] for advanced endoscopy Plan: Continue n.p.o. for now, ice chips okay Continue Zosyn Daily CMP Maintenance IV fluids As needed pain control Close monitoring of vital signs.  Hypokalemia Mild, asymptomatic Replaced in the ED Daily electrolytes, replace as necessary  History of CVA Hyperlipidemia Aspirin and statin on hold for now  GERD Daily PPI  Chronic back pain Home tramadol resumed  History of breast cancer Home anastrozole on hold for now   DVT prophylaxis: SCDs Code Status: Full Family Communication: Daughter Golda Acre [phone] on 6/5 Disposition Plan: Status is: Inpatient  Remains inpatient appropriate because:Inpatient level of care appropriate due to severity of illness   Dispo: The patient is from: Home              Anticipated d/c is to: Home              Patient currently is not medically stable to d/c.   Difficult to place patient No  Choledocholithiasis.  Not exhibiting signs of ascending cholangitis at this time.  Anticipated ERCP with follow-up cholecystectomy.     Level of care: Med-Surg  Consultants:   GI  Procedures:   None  Antimicrobials:   Zosyn   Subjective: Seen and examined.  Resting comfortably  in bed.  No visible distress.  No pain complaints.  Objective: Vitals:   01/27/21 2000 01/27/21 2356 01/28/21 0511 01/28/21 0800  BP: (!) 147/86 93/68 123/68 128/68  Pulse: 90 71 84 79  Resp: 18 18 18 18   Temp: 98.4 F (36.9 C) 98.2 F (36.8 C) 98.5 F (36.9 C) 97.8 F (36.6 C)  TempSrc: Oral  Oral Oral  SpO2: 96%   94% 95%  Weight: 71.7 kg     Height: 5' 2.99" (1.6 m)       Intake/Output Summary (Last 24 hours) at 01/28/2021 1129 Last data filed at 01/28/2021 0325 Gross per 24 hour  Intake 1009.9 ml  Output --  Net 1009.9 ml   Filed Weights   01/27/21 1515 01/27/21 2000  Weight: 76 kg 71.7 kg    Examination:  General exam: Appears calm and comfortable  Respiratory system: Clear to auscultation. Respiratory effort normal. Cardiovascular system: S1 & S2 heard, RRR. No JVD, murmurs, rubs, gallops or clicks. No pedal edema. Gastrointestinal system: Abdomen is nondistended, soft and nontender. No organomegaly or masses felt. Normal bowel sounds heard. Central nervous system: Alert and oriented. No focal neurological deficits. Extremities: Symmetric 5 x 5 power. Skin: No rashes, lesions or ulcers Psychiatry: Judgement and insight appear normal. Mood & affect appropriate.     Data Reviewed: I have personally reviewed following labs and imaging studies  CBC: Recent Labs  Lab 01/27/21 1525 01/28/21 0524  WBC 11.2* 9.1  HGB 12.9 11.9*  HCT 37.2 34.4*  MCV 95.9 96.9  PLT 264 102   Basic Metabolic Panel: Recent Labs  Lab 01/27/21 1525 01/27/21 1626 01/28/21 0524  NA 141  --  138  K 3.3*  --  3.7  CL 107  --  109  CO2 24  --  23  GLUCOSE 147*  --  120*  BUN 12  --  11  CREATININE 0.53  --  0.42*  CALCIUM 9.1  --  8.4*  MG  --  1.8  --    GFR: Estimated Creatinine Clearance: 71 mL/min (A) (by C-G formula based on SCr of 0.42 mg/dL (L)). Liver Function Tests: Recent Labs  Lab 01/27/21 1525 01/28/21 0524  AST 128* 156*  ALT 185* 206*  ALKPHOS 435* 414*  BILITOT 3.8* 5.2*  PROT 6.6 5.7*  ALBUMIN 3.7 3.0*   Recent Labs  Lab 01/27/21 1525  LIPASE 39   No results for input(s): AMMONIA in the last 168 hours. Coagulation Profile: No results for input(s): INR, PROTIME in the last 168 hours. Cardiac Enzymes: No results for input(s): CKTOTAL, CKMB, CKMBINDEX, TROPONINI in  the last 168 hours. BNP (last 3 results) No results for input(s): PROBNP in the last 8760 hours. HbA1C: No results for input(s): HGBA1C in the last 72 hours. CBG: No results for input(s): GLUCAP in the last 168 hours. Lipid Profile: No results for input(s): CHOL, HDL, LDLCALC, TRIG, CHOLHDL, LDLDIRECT in the last 72 hours. Thyroid Function Tests: No results for input(s): TSH, T4TOTAL, FREET4, T3FREE, THYROIDAB in the last 72 hours. Anemia Panel: No results for input(s): VITAMINB12, FOLATE, FERRITIN, TIBC, IRON, RETICCTPCT in the last 72 hours. Sepsis Labs: No results for input(s): PROCALCITON, LATICACIDVEN in the last 168 hours.  Recent Results (from the past 240 hour(s))  Resp Panel by RT-PCR (Flu A&B, Covid) Nasopharyngeal Swab     Status: None   Collection Time: 01/27/21 10:54 PM   Specimen: Nasopharyngeal Swab; Nasopharyngeal(NP) swabs in vial transport medium  Result Value Ref  Range Status   SARS Coronavirus 2 by RT PCR NEGATIVE NEGATIVE Final    Comment: (NOTE) SARS-CoV-2 target nucleic acids are NOT DETECTED.  The SARS-CoV-2 RNA is generally detectable in upper respiratory specimens during the acute phase of infection. The lowest concentration of SARS-CoV-2 viral copies this assay can detect is 138 copies/mL. A negative result does not preclude SARS-Cov-2 infection and should not be used as the sole basis for treatment or other patient management decisions. A negative result may occur with  improper specimen collection/handling, submission of specimen other than nasopharyngeal swab, presence of viral mutation(s) within the areas targeted by this assay, and inadequate number of viral copies(<138 copies/mL). A negative result must be combined with clinical observations, patient history, and epidemiological information. The expected result is Negative.  Fact Sheet for Patients:  EntrepreneurPulse.com.au  Fact Sheet for Healthcare Providers:   IncredibleEmployment.be  This test is no t yet approved or cleared by the [place] FDA and  has been authorized for detection and/or diagnosis of SARS-CoV-2 by FDA under an Emergency Use Authorization (EUA). This EUA will remain  in effect (meaning this test can be used) for the duration of the COVID-19 declaration under Section 564(b)(1) of the Act, 21 U.S.C.section 360bbb-3(b)(1), unless the authorization is terminated  or revoked sooner.       Influenza A by PCR NEGATIVE NEGATIVE Final   Influenza B by PCR NEGATIVE NEGATIVE Final    Comment: (NOTE) The Xpert Xpress SARS-CoV-2/FLU/RSV plus assay is intended as an aid in the diagnosis of influenza from Nasopharyngeal swab specimens and should not be used as a sole basis for treatment. Nasal washings and aspirates are unacceptable for Xpert Xpress SARS-CoV-2/FLU/RSV testing.  Fact Sheet for Patients: EntrepreneurPulse.com.au  Fact Sheet for Healthcare Providers: IncredibleEmployment.be  This test is not yet approved or cleared by the [place] FDA and has been authorized for detection and/or diagnosis of SARS-CoV-2 by FDA under an Emergency Use Authorization (EUA). This EUA will remain in effect (meaning this test can be used) for the duration of the COVID-19 declaration under Section 564(b)(1) of the Act, 21 U.S.C. section 360bbb-3(b)(1), unless the authorization is terminated or revoked.  Performed at [facility], [address] [place],  [address]          Radiology Studies: US ABDOMEN LIMITED RUQ (LIVER/GB)  Result Date: 01/27/2021 CLINICAL DATA:  RIGHT upper quadrant pain, elevated liver function test. Back pain today. EXAM: ULTRASOUND ABDOMEN LIMITED RIGHT UPPER QUADRANT COMPARISON:  None. FINDINGS: Gallbladder: Gallbladder wall is thickened and edematous, measuring up to 7 mm thickness. Multiple mobile stones within the gallbladder  fundus region, largest measuring 6 mm. Common bile duct: Diameter: 19 mm. Multiple obstructing stones are seen within the markedly distended common bile duct, and probable adjacent sludge. Liver: No focal lesion identified. Liver is diffusely echogenic indicating fatty infiltration. Portal vein is patent on color Doppler imaging with normal direction of blood flow towards the liver. Other: None. IMPRESSION: 1. Common bile duct is markedly distended, measuring 19 mm diameter, with multiple obstructing stones and sludge within the mid-to-distal CBD. 2. Gallbladder walls are thickened/edematous suggesting associated acute cholecystitis. 3. Cholelithiasis. 4. Fatty infiltration of the liver. Electronically Signed   By: Franki Cabot M.D.   On: 01/27/2021 17:24        Scheduled Meds: . heparin  5,000 Units Subcutaneous Q8H  . nicotine  21 mg Transdermal Daily  . sodium chloride flush  3 mL Intravenous Q12H   Continuous Infusions: .  sodium chloride 125 mL/hr at 01/28/21 0916  . piperacillin-tazobactam (ZOSYN)  IV 3.375 g (01/28/21 0505)     LOS: 1 day    Time spent: 25 minutes    Sidney Ace, MD [facility] Pager [phone]  If 7PM-7AM, please contact night-coverage 01/28/2021, 11:29 AM

## 2021-01-28 NOTE — Plan of Care (Signed)
Continuing with plan of care. 

## 2021-01-28 NOTE — Consult Note (Signed)
Vonda Antigua, MD [address], [place], [place], [place], [address] [address], [place], [place], [place], [address] Phone: [phone]  Fax: [phone]  Consultation  Referring Provider:     Dr. Priscella Mann Primary Care Physician:  Tower, Wynelle Fanny, MD Reason for Consultation:    Choledocholithiasis  Date of Admission:  01/27/2021 Date of Consultation:  01/28/2021         HPI:   Amy Leonard is a 60 y.o. female who reports having mid epigastric intermittent abdominal pain since April 2022 but it would only last 1 to 2 days and resolved and therefore she did not seek medical attention.  However, as of the last 5 to 7 days patient has noted dark-colored urine which was not getting better and she did have midepigastric pain 1 to 2 days ago that has resolved at this time.  No nausea or vomiting.  Liver enzymes are elevated on admission.  Patient is afebrile.    Past Medical History:  Diagnosis Date  . Anxiety   . Anxiety and depression   . Barrett's esophageal ulceration   . 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 Breast cancer ([place]) 2014   BILATERAL LUMPECTOMY  . Carpal tunnel syndrome   . Chronic back pain    spinal stenosis  . Depression   . Elevated WBC count    nl diff  . Erosive gastritis   . Folliculitis 02/26/2594  . GERD (gastroesophageal reflux disease)   . Glaucoma    ?  Marland Kitchen History of chemotherapy 2014   BREAST CA  . HLD (hyperlipidemia)   . HSV infection    recurrent (side and buttox)  . Hypertension   . Migraine   . Personal history of chemotherapy 2014   bilateral breast ca  . Personal history of radiation therapy 2014   bilateral breast ca  . Radiation 2014   BREAST CA  . Tobacco abuse   . Wears dentures    top    Past Surgical History:  Procedure Laterality Date  . 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 BREAST LUMPECTOMY Bilateral 2014   bilateral breast ca  . BTL    .  COLONOSCOPY    . epidural steroid injection  2008  . LUMBAR FUSION  03/2016   L4-5  . PARTIAL MASTECTOMY WITH NEEDLE LOCALIZATION AND AXILLARY SENTINEL LYMPH NODE BX Bilateral 11/23/2012   Procedure: PARTIAL MASTECTOMY WITH NEEDLE LOCALIZATION AND AXILLARY SENTINEL LYMPH NODE BX;  Surgeon: Adin Hector, MD;  Location: [facility];  Service: General;  Laterality: Bilateral;  . PORT-A-CATH REMOVAL Right 08/25/2013   Procedure: REMOVAL PORT-A-CATH;  Surgeon: Adin Hector, MD;  Location: [facility];  Service: General;  Laterality: Right;  . PORTACATH PLACEMENT Right 11/23/2012   Procedure: INSERTION PORT-A-CATH;  Surgeon: Adin Hector, MD;  Location: [facility];  Service: General;  Laterality: Right;  . UPPER GI ENDOSCOPY      Prior to Admission medications   Medication Sig Start Date End Date Taking? Authorizing Provider  Acetaminophen (TYLENOL ARTHRITIS PAIN PO) Take 2 tablets by mouth as needed (pain).    Yes [provider]  anastrozole (ARIMIDEX) 1 MG tablet TAKE 1 TABLET DAILY 12/07/20  Yes Cammie Sickle, MD  aspirin 81 MG chewable tablet Chew 1 tablet (81 mg total) by mouth daily. 02/10/15  Yes Tower, Wynelle Fanny, MD  atorvastatin (LIPITOR) 10  MG tablet Take 1 tablet (10 mg total) by mouth daily. 09/29/20  Yes Tower, Wynelle Fanny, MD  b complex vitamins tablet Take 2 tablets by mouth daily.   Yes [provider]  calcium carbonate (TUMS - DOSED IN MG ELEMENTAL CALCIUM) 500 MG chewable tablet Chew by mouth as directed.   Yes [provider]  Calcium Carbonate-Vitamin D (CALCIUM + D PO) Take 1 tablet by mouth daily.   Yes [provider]  omeprazole (PRILOSEC) 40 MG capsule Take 1 capsule (40 mg total) by mouth daily. 02/03/15  Yes Ladene Artist, MD  traMADol (ULTRAM) 50 MG tablet Take 1 tablet (50 mg total) by mouth every 6 (six) hours as needed for moderate pain. 01/19/20  Yes Sable Feil, PA-C   trolamine salicylate (ASPERCREME) 10 % cream Apply 1 application topically as needed for muscle pain.   Yes [provider]  famotidine (PEPCID) 20 MG tablet Take 20 mg by mouth 2 (two) times daily. Patient not taking: No sig reported    [provider]    Family History  Problem Relation Age of Onset  . Stroke Maternal Grandmother   . Diabetes Maternal Grandmother   . Diabetes Mother   . Hypertension Mother   . Leukemia Mother   . Obesity Mother   . Depression Sister   . Bipolar disorder Son        Bipolar / oppositional defiant  . Alcohol abuse Sister   . Drug abuse Sister   . Alcohol abuse Sister   . Drug abuse Sister   . Alcohol abuse Brother   . Drug abuse Brother   . Pancreatic cancer Brother   . Colon cancer Neg Hx   . Breast cancer Neg Hx      Social History   Tobacco Use  . Smoking status: Current Every Day Smoker    Packs/day: 0.50    Years: 33.00    Pack years: 16.50    Types: Cigarettes  . Smokeless tobacco: Never Used  Substance Use Topics  . Alcohol use: Yes    Alcohol/week: 0.0 standard drinks    Comment: beer rarely  . Drug use: No    Allergies as of 01/27/2021 - Review Complete 01/27/2021  Allergen Reaction Noted  . Gabapentin Swelling 02/17/2017  . Lyrica [pregabalin] Other (See Comments) 10/23/2015  . Voltaren [diclofenac sodium] Nausea And Vomiting 05/05/2019  . Bupropion hcl Nausea Only and Other (See Comments)   . Chlorhexidine gluconate Itching and Rash 11/23/2012  . Hydrocod polst-cpm polst er Nausea And Vomiting   . Lisinopril Other (See Comments) 10/14/2012  . Naproxen sodium Other (See Comments)     Review of Systems:    All systems reviewed and negative except where noted in HPI.   Physical Exam:  Vital signs in last 24 hours: Vitals:   01/27/21 2356 01/28/21 0511 01/28/21 0800 01/28/21 1132  BP: 93/68 123/68 128/68 118/76  Pulse: 71 84 79 73  Resp: 18 18 18 16   Temp: 98.2 F (36.8 C) 98.5 F (36.9 C)  97.8 F (36.6 C) 98.5 F (36.9 C)  TempSrc:  Oral Oral Oral  SpO2:  94% 95% 94%  Weight:      Height:         General:   Pleasant, cooperative in NAD Head:  Normocephalic and atraumatic. Eyes:   No icterus.   Conjunctiva pink. PERRLA. Ears:  Normal auditory acuity. Neck:  Supple; no masses or thyroidomegaly Lungs: Respirations even and unlabored.  Lungs clear to auscultation bilaterally.   No wheezes, crackles, or rhonchi.  Abdomen:  Soft, nondistended, nontender. Normal bowel sounds. No appreciable masses or hepatomegaly.  No rebound or guarding.  Neurologic:  Alert and oriented x3;  grossly normal neurologically. Skin:  Intact without significant lesions or rashes. Cervical Nodes:  No significant cervical adenopathy. Psych:  Alert and cooperative. Normal affect.  LAB RESULTS: Recent Labs    01/27/21 1525 01/28/21 0524  WBC 11.2* 9.1  HGB 12.9 11.9*  HCT 37.2 34.4*  PLT 264 240   BMET Recent Labs    01/27/21 1525 01/28/21 0524  NA 141 138  K 3.3* 3.7  CL 107 109  CO2 24 23  GLUCOSE 147* 120*  BUN 12 11  CREATININE 0.53 0.42*  CALCIUM 9.1 8.4*   LFT Recent Labs    01/27/21 1525 01/28/21 0524  PROT 6.6 5.7*  ALBUMIN 3.7 3.0*  AST 128* 156*  ALT 185* 206*  ALKPHOS 435* 414*  BILITOT 3.8* 5.2*  BILIDIR 2.5*  --    PT/INR No results for input(s): LABPROT, INR in the last 72 hours.  STUDIES: US ABDOMEN LIMITED RUQ (LIVER/GB)  Result Date: 01/27/2021 CLINICAL DATA:  RIGHT upper quadrant pain, elevated liver function test. Back pain today. EXAM: ULTRASOUND ABDOMEN LIMITED RIGHT UPPER QUADRANT COMPARISON:  None. FINDINGS: Gallbladder: Gallbladder wall is thickened and edematous, measuring up to 7 mm thickness. Multiple mobile stones within the gallbladder fundus region, largest measuring 6 mm. Common bile duct: Diameter: 19 mm. Multiple obstructing stones are seen within the markedly distended common bile duct, and probable adjacent sludge. Liver: No focal lesion  identified. Liver is diffusely echogenic indicating fatty infiltration. Portal vein is patent on color Doppler imaging with normal direction of blood flow towards the liver. Other: None. IMPRESSION: 1. Common bile duct is markedly distended, measuring 19 mm diameter, with multiple obstructing stones and sludge within the mid-to-distal CBD. 2. Gallbladder walls are thickened/edematous suggesting associated acute cholecystitis. 3. Cholelithiasis. 4. Fatty infiltration of the liver. Electronically Signed   By: Franki Cabot M.D.   On: 01/27/2021 17:24      Impression / Plan:   Amy Leonard is a 60 y.o. y/o female with presentation for midepigastric abdominal pain, associated with dark urine, with elevated liver enzymes, and imaging showing markedly distended common bile duct, with multiple CBD stones, and acute cholecystitis  Patient does not have any clinical evidence of cholangitis at this time, therefore no indication for emergent ERCP  Case has been discussed with Dr. Allen Norris, and he is available for performing the ERCP early next week, likely tomorrow and if not able to be scheduled tomorrow, then it will be done the day after  If patient develops any signs of ascending cholangitis, she may need to be transferred to tertiary center, such as [facility], for evaluation of earlier ERCP as necessary.    Patient reports resolution of abdominal pain and is asking for something to eat.  Clear liquid diet okay today, ordered.  N.p.o. past midnight, for possible ERCP tomorrow  Surgery consult for cholecystitis and to discuss timeline of cholecystectomy  Would also recommend outpatient follow-up in GI clinic given fatty liver seen on imaging, for follow-up and further work-up for this as necessary.  Thank you for involving me in the care of this patient.      LOS: 1 day   Virgel Manifold, MD  01/28/2021, 12:18 PM

## 2021-01-28 NOTE — Plan of Care (Signed)
  Problem: Clinical Measurements: Goal: Ability to maintain clinical measurements within normal limits will improve Outcome: Progressing Goal: Will remain free from infection Outcome: Progressing Goal: Diagnostic test results will improve Outcome: Progressing Goal: Respiratory complications will improve Outcome: Progressing Goal: Cardiovascular complication will be avoided Outcome: Progressing   Problem: Pain Managment: Goal: General experience of comfort will improve Outcome: Progressing   Pt V/S stable. No complaints of pain at this time. Kept on NPO except ice chips.

## 2021-01-29 ENCOUNTER — Inpatient Hospital Stay: Payer: BC Managed Care – PPO | Admitting: Anesthesiology

## 2021-01-29 ENCOUNTER — Inpatient Hospital Stay: Payer: BC Managed Care – PPO

## 2021-01-29 ENCOUNTER — Encounter
Admission: EM | Disposition: A | Discharge status: Home / Self Care | Payer: Self-pay | Source: Home / Self Care | Attending: Internal Medicine

## 2021-01-29 ENCOUNTER — Encounter: Payer: Self-pay | Admitting: Internal Medicine

## 2021-01-29 DIAGNOSIS — K8050 Calculus of bile duct without cholangitis or cholecystitis without obstruction: Secondary | ICD-10-CM

## 2021-01-29 DIAGNOSIS — K8042 Calculus of bile duct with acute cholecystitis without obstruction: Secondary | ICD-10-CM

## 2021-01-29 HISTORY — PX: ERCP: SHX5425

## 2021-01-29 LAB — COMPREHENSIVE METABOLIC PANEL
ALT: 168 U/L — ABNORMAL HIGH (ref 0–44)
AST: 94 U/L — ABNORMAL HIGH (ref 15–41)
Albumin: 2.8 g/dL — ABNORMAL LOW (ref 3.5–5.0)
Alkaline Phosphatase: 337 U/L — ABNORMAL HIGH (ref 38–126)
Anion gap: 7 (ref 5–15)
BUN: 7 mg/dL (ref 6–20)
CO2: 22 mmol/L (ref 22–32)
Calcium: 8.3 mg/dL — ABNORMAL LOW (ref 8.9–10.3)
Chloride: 111 mmol/L (ref 98–111)
Creatinine, Ser: 0.37 mg/dL — ABNORMAL LOW (ref 0.44–1.00)
GFR, Estimated: >60 mL/min (ref >60)
Glucose, Bld: 109 mg/dL — ABNORMAL HIGH (ref 70–99)
Potassium: 3.7 mmol/L (ref 3.5–5.1)
Sodium: 140 mmol/L (ref 135–145)
Total Bilirubin: 3.4 mg/dL — ABNORMAL HIGH (ref 0.3–1.2)
Total Protein: 5.3 g/dL — ABNORMAL LOW (ref 6.5–8.1)

## 2021-01-29 LAB — CBC WITH DIFFERENTIAL/PLATELET
Abs Immature Granulocytes: 0.01 10*3/uL (ref 0.00–0.07)
Basophils Absolute: 0 10*3/uL (ref 0.0–0.1)
Basophils Relative: 1 %
Eosinophils Absolute: 0.1 10*3/uL (ref 0.0–0.5)
Eosinophils Relative: 2 %
HCT: 34 % — ABNORMAL LOW (ref 36.0–46.0)
Hemoglobin: 11.9 g/dL — ABNORMAL LOW (ref 12.0–15.0)
Immature Granulocytes: 0 %
Lymphocytes Relative: 34 %
Lymphs Abs: 2.1 10*3/uL (ref 0.7–4.0)
MCH: 33.7 pg (ref 26.0–34.0)
MCHC: 35 g/dL (ref 30.0–36.0)
MCV: 96.3 fL (ref 80.0–100.0)
Monocytes Absolute: 0.7 10*3/uL (ref 0.1–1.0)
Monocytes Relative: 11 %
Neutro Abs: 3.2 10*3/uL (ref 1.7–7.7)
Neutrophils Relative %: 52 %
Platelets: 231 10*3/uL (ref 150–400)
RBC: 3.53 MIL/uL — ABNORMAL LOW (ref 3.87–5.11)
RDW: 14.5 % (ref 11.5–15.5)
WBC: 6.2 10*3/uL (ref 4.0–10.5)
nRBC: 0 % (ref 0.0–0.2)

## 2021-01-29 SURGERY — ERCP, WITH INTERVENTION IF INDICATED
Anesthesia: General

## 2021-01-29 MED ORDER — GLUCAGON HCL RDNA (DIAGNOSTIC) 1 MG IJ SOLR
INTRAMUSCULAR | Status: AC
  Filled 2021-01-29: qty 1

## 2021-01-29 MED ORDER — GLYCOPYRROLATE 0.2 MG/ML IJ SOLN
INTRAMUSCULAR | Status: AC
  Filled 2021-01-29: qty 1

## 2021-01-29 MED ORDER — METOPROLOL TARTRATE 5 MG/5ML IV SOLN
INTRAVENOUS | Status: DC | PRN
  Administered 2021-01-29: 2 mg via INTRAVENOUS
  Administered 2021-01-29: 1 mg via INTRAVENOUS
  Administered 2021-01-29: 2 mg via INTRAVENOUS

## 2021-01-29 MED ORDER — INDOMETHACIN 50 MG RE SUPP
RECTAL | Status: DC | PRN
  Administered 2021-01-29: 100 mg via RECTAL

## 2021-01-29 MED ORDER — INDOMETHACIN 50 MG RE SUPP
RECTAL | Status: AC
  Filled 2021-01-29: qty 2

## 2021-01-29 MED ORDER — GLUCAGON HCL RDNA (DIAGNOSTIC) 1 MG IJ SOLR
INTRAMUSCULAR | Status: DC | PRN
  Administered 2021-01-29: .5 mg via INTRAVENOUS

## 2021-01-29 MED ORDER — DEXMEDETOMIDINE HCL 200 MCG/2ML IV SOLN
INTRAVENOUS | Status: DC | PRN
  Administered 2021-01-29: 6 ug via INTRAVENOUS
  Administered 2021-01-29: 10 ug via INTRAVENOUS
  Administered 2021-01-29: 4 ug via INTRAVENOUS

## 2021-01-29 MED ORDER — DEXMEDETOMIDINE (PRECEDEX) IN NS 20 MCG/5ML (4 MCG/ML) IV SYRINGE
PREFILLED_SYRINGE | INTRAVENOUS | Status: AC
  Filled 2021-01-29: qty 5

## 2021-01-29 MED ORDER — PROPOFOL 500 MG/50ML IV EMUL
INTRAVENOUS | Status: DC | PRN
  Administered 2021-01-29: 150 ug/kg/min via INTRAVENOUS

## 2021-01-29 MED ORDER — MENTHOL 3 MG MT LOZG
1.0000 | LOZENGE | OROMUCOSAL | Status: DC | PRN
  Administered 2021-01-29: 3 mg via ORAL
  Filled 2021-01-29: qty 9

## 2021-01-29 MED ORDER — INDOMETHACIN 50 MG RE SUPP: 100.0000 mg | Freq: Once | RECTAL | Status: DC

## 2021-01-29 MED ORDER — LACTATED RINGERS IV SOLN: INTRAVENOUS | Status: DC

## 2021-01-29 MED ORDER — GLYCOPYRROLATE 0.2 MG/ML IJ SOLN
INTRAMUSCULAR | Status: DC | PRN
  Administered 2021-01-29: .1 mg via INTRAVENOUS

## 2021-01-29 MED ORDER — LIDOCAINE HCL (CARDIAC) PF 100 MG/5ML IV SOSY
PREFILLED_SYRINGE | INTRAVENOUS | Status: DC | PRN
  Administered 2021-01-29: 100 mg via INTRAVENOUS

## 2021-01-29 MED ORDER — PROPOFOL 10 MG/ML IV BOLUS
INTRAVENOUS | Status: DC | PRN
  Administered 2021-01-29: 60 mg via INTRAVENOUS

## 2021-01-29 NOTE — Progress Notes (Signed)
Patient returned from ERCP. Drowsy

## 2021-01-29 NOTE — Op Note (Signed)
[facility] Gastroenterology Patient Name: Amy Leonard Procedure Date: 01/29/2021 11:25 AM MRN: [id number] Account #: [id number] Date of Birth: 1961/03/14 Admit Type: Inpatient Age: 60 Room: [facility] ENDO ROOM 4 Gender: Female Note Status: Finalized Procedure:             ERCP Indications:           Common bile duct stone(s) Providers:             Lucilla Lame MD, MD Medicines:             Propofol per Anesthesia Complications:         No immediate complications. Procedure:             Pre-Anesthesia Assessment:                        - Prior to the procedure, a History and Physical was                         performed, and patient medications and allergies were                         reviewed. The patient's tolerance of previous                         anesthesia was also reviewed. The risks and benefits                         of the procedure and the sedation options and risks                         were discussed with the patient. All questions were                         answered, and informed consent was obtained. Prior                         Anticoagulants: The patient has taken no previous                         anticoagulant or antiplatelet agents. ASA Grade                         Assessment: II - A patient with mild systemic disease.                         After reviewing the risks and benefits, the patient                         was deemed in satisfactory condition to undergo the                         procedure.                        After obtaining informed consent, the scope was passed                         under direct vision. Throughout the procedure, the  patient's blood pressure, pulse, and oxygen                         saturations were monitored continuously. The [employer] D single use duodenoscope was                         introduced through the mouth, and used to  inject                         contrast into and used to inject contrast into the                         bile duct. The ERCP was accomplished without                         difficulty. The patient tolerated the procedure well. Findings:      The scout film was normal. The esophagus was successfully intubated       under direct vision. The scope was advanced to a normal major papilla in       the descending duodenum without detailed examination of the pharynx,       larynx and associated structures, and upper GI tract. The upper GI tract       was grossly normal. The bile duct was deeply cannulated with the       short-nosed traction sphincterotome. Contrast was injected. I personally       interpreted the bile duct images. There was brisk flow of contrast       through the ducts. Image quality was excellent. Contrast extended to the       entire biliary tree. The main bile duct contained multiple stones. The       main bile duct was dilated. A wire was passed into the biliary tree. A       10 mm biliary sphincterotomy was made with a traction (standard)       sphincterotome using ERBE electrocautery. There was no       post-sphincterotomy bleeding. The biliary tree was swept with a 15 mm       balloon starting at the bifurcation. Sludge was swept from the duct. 6       stones were removed. One stone remained. The largest stone would not       pass and a basket was used without sucess. The sphinterotomy was       extended and extraction was attempted again. The stone sould not pass.       One 10 Fr by 7 cm plastic stent with a single external flap and a single       internal flap was placed 5 cm into the common bile duct. Bile flowed       through the stent. The stent was in good position. Impression:            - The entire main bile duct was dilated.                        - Choledocholithiasis was found. Partial removal was  accomplished with biliary  sphincterotomy; a stent was                         inserted.                        - A biliary sphincterotomy was performed.                        - The biliary tree was swept.                        - One plastic stent was placed into the common bile                         duct. Recommendation:        - Return patient to hospital ward for ongoing care.                        - Clear liquid diet today. Procedure Code(s):     --- Professional ---                        [id number], Endoscopic retrograde cholangiopancreatography                         (ERCP); with placement of endoscopic stent into                         biliary or pancreatic duct, including pre- and                         post-dilation and guide wire passage, when performed,                         including sphincterotomy, when performed, each stent                        43264, Endoscopic retrograde cholangiopancreatography                         (ERCP); with removal of calculi/debris from                         biliary/pancreatic duct(s)                        39767, Endoscopic catheterization of the biliary                         ductal system, radiological supervision and                         interpretation Diagnosis Code(s):     --- Professional ---                        K80.50, Calculus of bile duct without cholangitis or                         cholecystitis without obstruction CPT copyright 2019 American Medical Association. All rights reserved. The codes documented in this  report are preliminary and upon coder review may  be revised to meet current compliance requirements. Lucilla Lame MD, MD 01/29/2021 12:44:27 PM This report has been signed electronically. Number of Addenda: 0 Note Initiated On: 01/29/2021 11:25 AM Estimated Blood Loss:  Estimated blood loss: none.      [facility]

## 2021-01-29 NOTE — Transfer of Care (Signed)
Immediate Anesthesia Transfer of Care Note  Patient: Amy Leonard  Procedure(s) Performed: ENDOSCOPIC RETROGRADE CHOLANGIOPANCREATOGRAPHY (ERCP) (N/A )  Patient Location: Endoscopy Unit  Anesthesia Type:General  Level of Consciousness: drowsy and patient cooperative  Airway & Oxygen Therapy: Patient Spontanous Breathing  Post-op Assessment: Report given to RN and Post -op Vital signs reviewed and stable  Post vital signs: Reviewed and stable  Last Vitals:  Vitals Value Taken Time  BP 110/66 01/29/21 1247  Temp 36.5 C 01/29/21 1247  Pulse 90 01/29/21 1249  Resp 19 01/29/21 1249  SpO2 94 % 01/29/21 1249  Vitals shown include unvalidated device data.  Last Pain:  Vitals:   01/29/21 1247  TempSrc: Temporal  PainSc: Asleep         Complications: No complications documented.

## 2021-01-29 NOTE — Anesthesia Preprocedure Evaluation (Signed)
Anesthesia Evaluation  Patient identified by MRN, date of birth, ID band Patient awake    Reviewed: Allergy & Precautions, NPO status , Patient's Chart, lab work & pertinent test results  History of Anesthesia Complications Negative for: history of anesthetic complications  Airway Mallampati: III  TM Distance: <3 FB Neck ROM: limited    Dental  (+) Chipped, Edentulous Lower, Missing   Pulmonary neg shortness of breath, COPD, Current Smoker and Patient abstained from smoking.,    Pulmonary exam normal        Cardiovascular Exercise Tolerance: Good hypertension, (-) angina(-) CAD Normal cardiovascular exam     Neuro/Psych  Headaches, PSYCHIATRIC DISORDERS  Neuromuscular disease    GI/Hepatic negative GI ROS, Neg liver ROS, PUD, GERD  Medicated and Controlled,  Endo/Other  negative endocrine ROS  Renal/GU negative Renal ROS  negative genitourinary   Musculoskeletal  (+) Arthritis ,   Abdominal   Peds  Hematology negative hematology ROS (+)   Anesthesia Other Findings Patient is NPO appropriate and reports no nausea or vomiting today.  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 2014: BREAST LUMPECTOMY; Bilateral     Comment:  bilateral breast ca No date: BTL No date: COLONOSCOPY 2008: epidural steroid injection 03/2016: LUMBAR FUSION     Comment:  L4-5 11/23/2012: PARTIAL MASTECTOMY WITH NEEDLE LOCALIZATION AND AXILLARY  SENTINEL LYMPH NODE BX; Bilateral     Comment:  Procedure: PARTIAL MASTECTOMY WITH NEEDLE LOCALIZATION               AND AXILLARY SENTINEL LYMPH NODE BX;  Surgeon: Adin Hector, MD;  Location: [place];                Service: General;  Laterality: Bilateral; 08/25/2013: PORT-A-CATH REMOVAL; Right     Comment:  Procedure: REMOVAL PORT-A-CATH;  Surgeon: Adin Hector, MD;  Location: [place];                Service: General;  Laterality: Right; 11/23/2012: PORTACATH PLACEMENT; Right     Comment:  Procedure: INSERTION PORT-A-CATH;  Surgeon: Edsel Petrin  Dalbert Batman, MD;  Location: [place];                Service: General;  Laterality: Right; No date: UPPER GI ENDOSCOPY  BMI    Body Mass Index: 28.01 kg/m      Reproductive/Obstetrics negative OB ROS                             Anesthesia Physical Anesthesia Plan  ASA: III  Anesthesia Plan: General   Post-op Pain Management:    Induction: Intravenous  PONV Risk Score and Plan: Propofol infusion and TIVA  Airway Management Planned: Natural Airway and Nasal Cannula  Additional Equipment:   Intra-op Plan:   Post-operative Plan:   Informed Consent: I have reviewed the patients History and Physical, chart, labs and discussed the procedure including the risks,  benefits and alternatives for the proposed anesthesia with the patient or authorized representative who has indicated his/her understanding and acceptance.     Dental Advisory Given  Plan Discussed with: Anesthesiologist, CRNA and Surgeon  Anesthesia Plan Comments: (Patient consented for risks of anesthesia including but not limited to:  - adverse reactions to medications - risk of airway placement if required - damage to eyes, teeth, lips or other oral mucosa - nerve damage due to positioning  - sore throat or hoarseness - Damage to heart, brain, nerves, lungs, other parts of body or loss of life  Patient voiced understanding.)        Anesthesia Quick Evaluation

## 2021-01-29 NOTE — Progress Notes (Signed)
Order received from Dr Laurian Brim to discontinue telemetry

## 2021-01-29 NOTE — Progress Notes (Signed)
PROGRESS NOTE    Amy Leonard  ZHY:[id number] DOB: November 26, 1960 DOA: 01/27/2021 PCP: Abner Greenspan, MD  Brief Narrative:  60 y.o. female with medical history significant of anxiety, depression, back pain, breast cancer, hypertension, CVA, lymphedema, hyperlipidemia, GERD, migraine, uses dentures who presents with ongoing abdominal pain.  Patient states that she has intermittent abdominal pain for about the past 3 months.  This pain as above comes and goes and it will radiate to her right upper quadrant from her epigastrium.  Pain described as a crampy and sharp pain with some associated nausea.  She states the pain is worse with eating.  She states that it would tend to feel better after vomiting.  The pain had been occurring about once a week until 2 weeks ago and it has been 2-3 times a week since then.  She also left work early twice this week.  She told her daughter who is a nurse about the pain earlier today who recommended she come to the ED for further evaluation.  Patient had LFT elevation.  follow-up right upper quadrant ultrasound shows markedly dilated common bile duct and 70mm concerning for CBD obstruction with stones in the bile duct and gallbladder sludge.  GI and general surgery were contacted from the emergency department.   Patient underwent successful ERCP with stent placement on 6/6.  Return to medical floor with recommendations for clear liquid diet today.  General surgery on board with tentative plan for cholecystectomy 6/8   Assessment & Plan:   Principal Problem:   Choledocholithiasis with acute cholecystitis Active Problems:   Pure hypercholesterolemia   ANXIETY DEPRESSION   Essential hypertension   Chronic back pain   History of CVA (cerebrovascular accident)  Choledocholithiasis with acute cholecystitis Elevated LFTs with hyperbilirubinemia Presented with 3 months of intermittent abdominal pain Associated with nausea and vomiting, worse after eating Right  upper quadrant ultrasound with CBD 19 mm and obstructive stones and gallbladder sludge Started on Zosyn in ED General surgery and GI consulted from ED Status post ERCP 6/6 Patient remained stable and did not develop signs of cholangitis Plan: Clear liquid diet today Continue Zosyn Daily CMP Decrease rate of maintenance fluids As needed pain control Close monitoring of vital signs. Surgery on board for plans for cholecystectomy 6/8  Hypokalemia Mild, asymptomatic Replaced in the ED Daily electrolytes, replace as necessary  History of CVA Hyperlipidemia Aspirin and statin on hold for now Can restart within a day or 2  GERD Daily PPI  Chronic back pain Home tramadol resumed  History of breast cancer Home anastrozole on hold for now   DVT prophylaxis: SCDs Code Status: Full Family Communication: Daughter Golda Acre [phone] on 6/5, bedside 6/6 Disposition Plan: Status is: Inpatient  Remains inpatient appropriate because:Inpatient level of care appropriate due to severity of illness   Dispo: The patient is from: Home              Anticipated d/c is to: Home              Patient currently is not medically stable to d/c.   Difficult to place patient No  Choledocholithiasis status post ERCP and stone retrieval as well as biliary stent placement.  Plans for cholecystectomy 6/8.     Level of care: Med-Surg  Consultants:   GI  Procedures:   None  Antimicrobials:   Zosyn   Subjective: Patient seen and examined.  No pain.  Resting comfortably in bed.  No visible distress  Objective:  Vitals:   01/29/21 0822 01/29/21 1247 01/29/21 1257 01/29/21 1307  BP:  110/66 112/69 (!) 149/76  Pulse: 75  78 69  Resp:   18 (!) 6  Temp:  97.7 F (36.5 C)    TempSrc:  Temporal    SpO2: 96%  96% 96%  Weight:      Height:        Intake/Output Summary (Last 24 hours) at 01/29/2021 1330 Last data filed at 01/29/2021 1236 Gross per 24 hour  Intake 2999.83 ml   Output --  Net 2999.83 ml   Filed Weights   01/27/21 1515 01/27/21 2000  Weight: 76 kg 71.7 kg    Examination:  General exam: Appears calm and comfortable  Respiratory system: Clear to auscultation. Respiratory effort normal. Cardiovascular system: S1 & S2 heard, RRR. No JVD, murmurs, rubs, gallops or clicks. No pedal edema. Gastrointestinal system: Mild RUQ tender to palpation.  Nondistended.  No rebound or guarding.  Normal bowel sounds Central nervous system: Alert and oriented. No focal neurological deficits. Extremities: Symmetric 5 x 5 power. Skin: Appears jaundiced Psychiatry: Judgement and insight appear normal. Mood & affect appropriate.     Data Reviewed: I have personally reviewed following labs and imaging studies  CBC: Recent Labs  Lab 01/27/21 1525 01/28/21 0524 01/29/21 0609  WBC 11.2* 9.1 6.2  NEUTROABS  --   --  3.2  HGB 12.9 11.9* 11.9*  HCT 37.2 34.4* 34.0*  MCV 95.9 96.9 96.3  PLT 264 240 161   Basic Metabolic Panel: Recent Labs  Lab 01/27/21 1525 01/27/21 1626 01/28/21 0524 01/29/21 0609  NA 141  --  138 140  K 3.3*  --  3.7 3.7  CL 107  --  109 111  CO2 24  --  23 22  GLUCOSE 147*  --  120* 109*  BUN 12  --  11 7  CREATININE 0.53  --  0.42* 0.37*  CALCIUM 9.1  --  8.4* 8.3*  MG  --  1.8  --   --    GFR: Estimated Creatinine Clearance: 71 mL/min (A) (by C-G formula based on SCr of 0.37 mg/dL (L)). Liver Function Tests: Recent Labs  Lab 01/27/21 1525 01/28/21 0524 01/29/21 0609  AST 128* 156* 94*  ALT 185* 206* 168*  ALKPHOS 435* 414* 337*  BILITOT 3.8* 5.2* 3.4*  PROT 6.6 5.7* 5.3*  ALBUMIN 3.7 3.0* 2.8*   Recent Labs  Lab 01/27/21 1525  LIPASE 39   No results for input(s): AMMONIA in the last 168 hours. Coagulation Profile: No results for input(s): INR, PROTIME in the last 168 hours. Cardiac Enzymes: No results for input(s): CKTOTAL, CKMB, CKMBINDEX, TROPONINI in the last 168 hours. BNP (last 3 results) No results  for input(s): PROBNP in the last 8760 hours. HbA1C: No results for input(s): HGBA1C in the last 72 hours. CBG: No results for input(s): GLUCAP in the last 168 hours. Lipid Profile: No results for input(s): CHOL, HDL, LDLCALC, TRIG, CHOLHDL, LDLDIRECT in the last 72 hours. Thyroid Function Tests: No results for input(s): TSH, T4TOTAL, FREET4, T3FREE, THYROIDAB in the last 72 hours. Anemia Panel: No results for input(s): VITAMINB12, FOLATE, FERRITIN, TIBC, IRON, RETICCTPCT in the last 72 hours. Sepsis Labs: No results for input(s): PROCALCITON, LATICACIDVEN in the last 168 hours.  Recent Results (from the past 240 hour(s))  Resp Panel by RT-PCR (Flu A&B, Covid) Nasopharyngeal Swab     Status: None   Collection Time: 01/27/21 10:54 PM   Specimen: Nasopharyngeal  Swab; Nasopharyngeal(NP) swabs in vial transport medium  Result Value Ref Range Status   SARS Coronavirus 2 by RT PCR NEGATIVE NEGATIVE Final    Comment: (NOTE) SARS-CoV-2 target nucleic acids are NOT DETECTED.  The SARS-CoV-2 RNA is generally detectable in upper respiratory specimens during the acute phase of infection. The lowest concentration of SARS-CoV-2 viral copies this assay can detect is 138 copies/mL. A negative result does not preclude SARS-Cov-2 infection and should not be used as the sole basis for treatment or other patient management decisions. A negative result may occur with  improper specimen collection/handling, submission of specimen other than nasopharyngeal swab, presence of viral mutation(s) within the areas targeted by this assay, and inadequate number of viral copies(<138 copies/mL). A negative result must be combined with clinical observations, patient history, and epidemiological information. The expected result is Negative.  Fact Sheet for Patients:  EntrepreneurPulse.com.au  Fact Sheet for Healthcare Providers:  IncredibleEmployment.be  This test is no t yet  approved or cleared by the [place] FDA and  has been authorized for detection and/or diagnosis of SARS-CoV-2 by FDA under an Emergency Use Authorization (EUA). This EUA will remain  in effect (meaning this test can be used) for the duration of the COVID-19 declaration under Section 564(b)(1) of the Act, 21 U.S.C.section 360bbb-3(b)(1), unless the authorization is terminated  or revoked sooner.       Influenza A by PCR NEGATIVE NEGATIVE Final   Influenza B by PCR NEGATIVE NEGATIVE Final    Comment: (NOTE) The Xpert Xpress SARS-CoV-2/FLU/RSV plus assay is intended as an aid in the diagnosis of influenza from Nasopharyngeal swab specimens and should not be used as a sole basis for treatment. Nasal washings and aspirates are unacceptable for Xpert Xpress SARS-CoV-2/FLU/RSV testing.  Fact Sheet for Patients: EntrepreneurPulse.com.au  Fact Sheet for Healthcare Providers: IncredibleEmployment.be  This test is not yet approved or cleared by the [place] FDA and has been authorized for detection and/or diagnosis of SARS-CoV-2 by FDA under an Emergency Use Authorization (EUA). This EUA will remain in effect (meaning this test can be used) for the duration of the COVID-19 declaration under Section 564(b)(1) of the Act, 21 U.S.C. section 360bbb-3(b)(1), unless the authorization is terminated or revoked.  Performed at [facility], [address], [place], [place] [address]          Radiology Studies: DG C-Arm 1-60 Min-No Report  Result Date: 01/29/2021 Fluoroscopy was utilized by the requesting physician.  No radiographic interpretation.   US ABDOMEN LIMITED RUQ (LIVER/GB)  Result Date: 01/27/2021 CLINICAL DATA:  RIGHT upper quadrant pain, elevated liver function test. Back pain today. EXAM: ULTRASOUND ABDOMEN LIMITED RIGHT UPPER QUADRANT COMPARISON:  None. FINDINGS: Gallbladder: Gallbladder wall is thickened and edematous,  measuring up to 7 mm thickness. Multiple mobile stones within the gallbladder fundus region, largest measuring 6 mm. Common bile duct: Diameter: 19 mm. Multiple obstructing stones are seen within the markedly distended common bile duct, and probable adjacent sludge. Liver: No focal lesion identified. Liver is diffusely echogenic indicating fatty infiltration. Portal vein is patent on color Doppler imaging with normal direction of blood flow towards the liver. Other: None. IMPRESSION: 1. Common bile duct is markedly distended, measuring 19 mm diameter, with multiple obstructing stones and sludge within the mid-to-distal CBD. 2. Gallbladder walls are thickened/edematous suggesting associated acute cholecystitis. 3. Cholelithiasis. 4. Fatty infiltration of the liver. Electronically Signed   By: Franki Cabot M.D.   On: 01/27/2021 17:24  Scheduled Meds: . glucagon (human recombinant)      . indomethacin      . nicotine  21 mg Transdermal Daily  . pantoprazole  40 mg Oral Daily  . sodium chloride flush  3 mL Intravenous Q12H   Continuous Infusions: . sodium chloride Stopped (01/29/21 1138)  . piperacillin-tazobactam (ZOSYN)  IV 12.5 mL/hr at 01/29/21 0831     LOS: 2 days    Time spent: 25 minutes    Sidney Ace, MD [facility] Pager [phone]  If 7PM-7AM, please contact night-coverage 01/29/2021, 1:30 PM

## 2021-01-29 NOTE — Consult Note (Addendum)
[facility] SURGICAL CONSULTATION NOTE (initial) - cpt: 36144   HISTORY OF PRESENT ILLNESS (HPI):  60 y.o. female presented to [facility] ED on 06/04 for evaluation of abdominal pain. Patient reports that over the last few days to weeks she has been noticing intermittent severe RUQ/Epigastric abdominal pain which has been waxing and waning. She reports associated nausea with as well. Interestingly, she has noticed her urine has become an orange color over this time span as well. She tried increasing PO fluids which only temporarily resolved this, but her urine returned to dark orange color the next day. No fever, chills, cough, CP, SOB, or bowel changes. She has not reported any juandice. Initial work up in the ED was concerning for hyperbilirubinemia to 3.8 with direct bilirubin being 2.5, elevated alk phos at 435, mild leukocytosis to 11.2, and RUQ [place] was concerning for markedly dilated CBD with choledocholithiasis and sludge. She was ultimately admitted to the medicine service and GI has seen in consultation. Plan for ERCP today (06/06) with Dr Allen Norris.   Surgery is consulted by emergency medicine physician Dr. Carrie Mew, MD in this context for evaluation and management of choledocholithiasis.   PAST MEDICAL HISTORY (PMH):  Past Medical History:  Diagnosis Date  . Anxiety   . Anxiety and depression   . Barrett's esophageal ulceration   . 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 Breast cancer ([place]) 2014   BILATERAL LUMPECTOMY  . Carpal tunnel syndrome   . Chronic back pain    spinal stenosis  . Depression   . Elevated WBC count    nl diff  . Erosive gastritis   . Folliculitis 10/24/5398  . GERD (gastroesophageal reflux disease)   . Glaucoma    ?  Marland Kitchen History of chemotherapy 2014   BREAST CA  . HLD (hyperlipidemia)   . HSV infection    recurrent (side and buttox)  . Hypertension   . Migraine   . Personal history of chemotherapy 2014   bilateral breast ca  .  Personal history of radiation therapy 2014   bilateral breast ca  . Radiation 2014   BREAST CA  . Tobacco abuse   . Wears dentures    top     PAST SURGICAL HISTORY ([place]):  Past Surgical History:  Procedure Laterality Date  . 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 BREAST LUMPECTOMY Bilateral 2014   bilateral breast ca  . BTL    . COLONOSCOPY    . epidural steroid injection  2008  . LUMBAR FUSION  03/2016   L4-5  . PARTIAL MASTECTOMY WITH NEEDLE LOCALIZATION AND AXILLARY SENTINEL LYMPH NODE BX Bilateral 11/23/2012   Procedure: PARTIAL MASTECTOMY WITH NEEDLE LOCALIZATION AND AXILLARY SENTINEL LYMPH NODE BX;  Surgeon: Adin Hector, MD;  Location: [place];  Service: General;  Laterality: Bilateral;  . PORT-A-CATH REMOVAL Right 08/25/2013   Procedure: REMOVAL PORT-A-CATH;  Surgeon: Adin Hector, MD;  Location: [place];  Service: General;  Laterality: Right;  . PORTACATH PLACEMENT Right 11/23/2012   Procedure: INSERTION PORT-A-CATH;  Surgeon: Adin Hector, MD;  Location: [place];  Service: General;  Laterality: Right;  . UPPER GI ENDOSCOPY       MEDICATIONS:  Prior to Admission medications   Medication Sig Start Date End Date Taking? Authorizing Provider  Acetaminophen (TYLENOL ARTHRITIS PAIN PO) Take 2 tablets by mouth as needed (pain).  Yes [provider]  anastrozole (ARIMIDEX) 1 MG tablet TAKE 1 TABLET DAILY 12/07/20  Yes Cammie Sickle, MD  aspirin 81 MG chewable tablet Chew 1 tablet (81 mg total) by mouth daily. 02/10/15  Yes Tower, Wynelle Fanny, MD  atorvastatin (LIPITOR) 10 MG tablet Take 1 tablet (10 mg total) by mouth daily. 09/29/20  Yes Tower, Wynelle Fanny, MD  b complex vitamins tablet Take 2 tablets by mouth daily.   Yes [provider]  calcium carbonate (TUMS - DOSED IN MG ELEMENTAL CALCIUM) 500 MG chewable tablet Chew by mouth as directed.   Yes  [provider]  Calcium Carbonate-Vitamin D (CALCIUM + D PO) Take 1 tablet by mouth daily.   Yes [provider]  omeprazole (PRILOSEC) 40 MG capsule Take 1 capsule (40 mg total) by mouth daily. 02/03/15  Yes Ladene Artist, MD  traMADol (ULTRAM) 50 MG tablet Take 1 tablet (50 mg total) by mouth every 6 (six) hours as needed for moderate pain. 01/19/20  Yes Sable Feil, PA-C  trolamine salicylate (ASPERCREME) 10 % cream Apply 1 application topically as needed for muscle pain.   Yes [provider]  famotidine (PEPCID) 20 MG tablet Take 20 mg by mouth 2 (two) times daily. Patient not taking: No sig reported    [provider]     ALLERGIES:  Allergies  Allergen Reactions  . Gabapentin Swelling  . Lyrica [Pregabalin] Other (See Comments)    sedated  . Voltaren [Diclofenac Sodium] Nausea And Vomiting  . Bupropion Hcl Nausea Only and Other (See Comments)     GI, sleepy  . Chlorhexidine Gluconate Itching and Rash  . Hydrocod Polst-Cpm Polst Er Nausea And Vomiting    vomiting  . Lisinopril Other (See Comments)    Leg cramps    . Naproxen Sodium Other (See Comments)    does not tolerate     SOCIAL HISTORY:  Social History   Socioeconomic History  . Marital status: Married    Spouse name: Not on file  . Number of children: Not on file  . Years of education: Not on file  . Highest education level: Not on file  Occupational History  . Occupation: Full time and PT job  Tobacco Use  . Smoking status: Current Every Day Smoker    Packs/day: 0.50    Years: 33.00    Pack years: 16.50    Types: Cigarettes  . Smokeless tobacco: Never Used  Substance and Sexual Activity  . Alcohol use: Yes    Alcohol/week: 0.0 standard drinks    Comment: beer rarely  . Drug use: No  . Sexual activity: Not Currently  Other Topics Concern  . Not on file  Social History Narrative   Separated; full time and part time job; no regular exercise.              Social Determinants of Health   Financial Resource Strain: Not on file  Food Insecurity: Not on file  Transportation Needs: Not on file  Physical Activity: Not on file  Stress: Not on file  Social Connections: Not on file  Intimate Partner Violence: Not on file     FAMILY HISTORY:  Family History  Problem Relation Age of Onset  . Stroke Maternal Grandmother   . Diabetes Maternal Grandmother   . Diabetes Mother   . Hypertension Mother   . Leukemia Mother   . Obesity Mother   . Depression Sister   . Bipolar disorder Son  Bipolar / oppositional defiant  . Alcohol abuse Sister   . Drug abuse Sister   . Alcohol abuse Sister   . Drug abuse Sister   . Alcohol abuse Brother   . Drug abuse Brother   . Pancreatic cancer Brother   . Colon cancer Neg Hx   . Breast cancer Neg Hx       REVIEW OF SYSTEMS:  ROS  VITAL SIGNS:  Temp:  [97.6 F (36.4 C)-98.9 F (37.2 C)] 98.1 F (36.7 C) (06/06 0821) Pulse Rate:  [63-84] 75 (06/06 0822) Resp:  [16-18] 16 (06/06 0821) BP: (106-148)/(69-88) 139/88 (06/06 0821) SpO2:  [92 %-99 %] 96 % (06/06 0822)     Height: 5' 2.99" (160 cm) Weight: 71.7 kg BMI (Calculated): 28.01   INTAKE/OUTPUT:  06/05 0701 - 06/06 0700 In: 3015.2 [I.V.:2855.2; IV Piggyback:160] Out: -   PHYSICAL EXAM:  Physical Exam Vitals and nursing note reviewed. Exam conducted with a chaperone present.  Constitutional:      General: She is not in acute distress.    Appearance: She is well-developed. She is not ill-appearing.  HENT:     Head: Normocephalic and atraumatic.  Eyes:     General: Scleral icterus present.     Extraocular Movements: Extraocular movements intact.  Cardiovascular:     Rate and Rhythm: Normal rate.     Heart sounds: Normal heart sounds. No murmur heard.   Pulmonary:     Effort: Pulmonary effort is normal. No respiratory distress.  Abdominal:     General: There is no distension.     Palpations: Abdomen is soft.      Tenderness: There is no abdominal tenderness. There is no guarding or rebound.  Genitourinary:    Comments: Deferred Skin:    General: Skin is warm and dry.     Coloration: Skin is jaundiced. Skin is not pale.  Neurological:     General: No focal deficit present.     Mental Status: She is alert and oriented to person, place, and time.  Psychiatric:        Mood and Affect: Mood normal.        Behavior: Behavior normal.      Labs:  CBC Latest Ref Rng & Units 01/29/2021 01/28/2021 01/27/2021  WBC 4.0 - 10.5 K/uL 6.2 9.1 11.2(H)  Hemoglobin 12.0 - 15.0 g/dL 11.9(L) 11.9(L) 12.9  Hematocrit 36.0 - 46.0 % 34.0(L) 34.4(L) 37.2  Platelets 150 - 400 K/uL 231 240 264   CMP Latest Ref Rng & Units 01/29/2021 01/28/2021 01/27/2021  Glucose 70 - 99 mg/dL 109(H) 120(H) 147(H)  BUN 6 - 20 mg/dL '7 11 12  ' Creatinine 0.44 - 1.00 mg/dL 0.37(L) 0.42(L) 0.53  Sodium 135 - 145 mmol/L 140 138 141  Potassium 3.5 - 5.1 mmol/L 3.7 3.7 3.3(L)  Chloride 98 - 111 mmol/L 111 109 107  CO2 22 - 32 mmol/L '22 23 24  ' Calcium 8.9 - 10.3 mg/dL 8.3(L) 8.4(L) 9.1  Total Protein 6.5 - 8.1 g/dL 5.3(L) 5.7(L) 6.6  Total Bilirubin 0.3 - 1.2 mg/dL 3.4(H) 5.2(H) 3.8(H)  Alkaline Phos 38 - 126 U/L 337(H) 414(H) 435(H)  AST 15 - 41 U/L 94(H) 156(H) 128(H)  ALT 0 - 44 U/L 168(H) 206(H) 185(H)     Imaging studies:   RUQ [place] (01/27/2021) personally reviewed and agree with radiologist report which is reviewed below:  IMPRESSION: 1. Common bile duct is markedly distended, measuring 19 mm diameter, with multiple obstructing stones and sludge within the mid-to-distal  CBD. 2. Gallbladder walls are thickened/edematous suggesting associated acute cholecystitis. 3. Cholelithiasis. 4. Fatty infiltration of the liver.   Assessment/Plan: (ICD-10's: K80.50) 60 y.o. female found to have choledocholithiasis.   - Appreciate GI assistance; plan for ERCP today (06/06)  - She will benefit from cholecystectomy this admission. I will  tentatively plan for this on Wednesday (06/08) with Dr Celine Ahr pending condition + OR/Anesthesia availability  - NPO for procedure today  - IV ABx (Zosyn)  - Monitor abdominal examination   - Pain control prn; antiemetics prn   - Monitor hyperbilirubinemia   - Further management per primary service; we will follow   All of the above findings and recommendations were discussed with the patient, and all of patient's questions were answered to her expressed satisfaction.  Thank you for the opportunity to participate in this patient's care.   -- Edison Simon, PA-C [facility] 01/29/2021, 8:24 AM [phone] M-F: 7am - 4pm   I saw and evaluated the patient.  I agree with the above documentation, exam, and plan, which I have edited where appropriate. Fredirick Maudlin  7:47 PM

## 2021-01-29 NOTE — Anesthesia Postprocedure Evaluation (Signed)
Anesthesia Post Note  Patient: Amy Leonard  Procedure(s) Performed: ENDOSCOPIC RETROGRADE CHOLANGIOPANCREATOGRAPHY (ERCP) (N/A )  Patient location during evaluation: Endoscopy Anesthesia Type: General Level of consciousness: awake and alert Pain management: pain level controlled Vital Signs Assessment: post-procedure vital signs reviewed and stable Respiratory status: spontaneous breathing, nonlabored ventilation, respiratory function stable and patient connected to nasal cannula oxygen Cardiovascular status: blood pressure returned to baseline and stable Postop Assessment: no apparent nausea or vomiting Anesthetic complications: no   No complications documented.   Last Vitals:  Vitals:   01/29/21 1257 01/29/21 1307  BP: 112/69 (!) 149/76  Pulse: 78 69  Resp: 18 (!) 6  Temp:    SpO2: 96% 96%    Last Pain:  Vitals:   01/29/21 1307  TempSrc:   PainSc: 0-No pain                 Precious Haws Delcie Ruppert

## 2021-01-30 ENCOUNTER — Encounter: Payer: Self-pay | Admitting: Gastroenterology

## 2021-01-30 LAB — CBC WITH DIFFERENTIAL/PLATELET
Abs Immature Granulocytes: 0.02 10*3/uL (ref 0.00–0.07)
Basophils Absolute: 0 10*3/uL (ref 0.0–0.1)
Basophils Relative: 0 %
Eosinophils Absolute: 0.1 10*3/uL (ref 0.0–0.5)
Eosinophils Relative: 1 %
HCT: 32.2 % — ABNORMAL LOW (ref 36.0–46.0)
Hemoglobin: 11.1 g/dL — ABNORMAL LOW (ref 12.0–15.0)
Immature Granulocytes: 0 %
Lymphocytes Relative: 41 %
Lymphs Abs: 2.8 10*3/uL (ref 0.7–4.0)
MCH: 33.9 pg (ref 26.0–34.0)
MCHC: 34.5 g/dL (ref 30.0–36.0)
MCV: 98.5 fL (ref 80.0–100.0)
Monocytes Absolute: 0.6 10*3/uL (ref 0.1–1.0)
Monocytes Relative: 8 %
Neutro Abs: 3.4 10*3/uL (ref 1.7–7.7)
Neutrophils Relative %: 50 %
Platelets: 202 10*3/uL (ref 150–400)
RBC: 3.27 MIL/uL — ABNORMAL LOW (ref 3.87–5.11)
RDW: 14.3 % (ref 11.5–15.5)
WBC: 6.8 10*3/uL (ref 4.0–10.5)
nRBC: 0 % (ref 0.0–0.2)

## 2021-01-30 LAB — COMPREHENSIVE METABOLIC PANEL
ALT: 130 U/L — ABNORMAL HIGH (ref 0–44)
AST: 60 U/L — ABNORMAL HIGH (ref 15–41)
Albumin: 2.7 g/dL — ABNORMAL LOW (ref 3.5–5.0)
Alkaline Phosphatase: 307 U/L — ABNORMAL HIGH (ref 38–126)
Anion gap: 4 — ABNORMAL LOW (ref 5–15)
BUN: 6 mg/dL (ref 6–20)
CO2: 24 mmol/L (ref 22–32)
Calcium: 8.4 mg/dL — ABNORMAL LOW (ref 8.9–10.3)
Chloride: 108 mmol/L (ref 98–111)
Creatinine, Ser: 0.56 mg/dL (ref 0.44–1.00)
GFR, Estimated: >60 mL/min (ref >60)
Glucose, Bld: 111 mg/dL — ABNORMAL HIGH (ref 70–99)
Potassium: 3.5 mmol/L (ref 3.5–5.1)
Sodium: 136 mmol/L (ref 135–145)
Total Bilirubin: 2.2 mg/dL — ABNORMAL HIGH (ref 0.3–1.2)
Total Protein: 5.1 g/dL — ABNORMAL LOW (ref 6.5–8.1)

## 2021-01-30 MED ORDER — SODIUM CHLORIDE 0.9 % IV SOLN: INTRAVENOUS | Status: DC

## 2021-01-30 NOTE — Progress Notes (Signed)
PROGRESS NOTE    LEXINGTON DEVINE  OVZ:[id number] DOB: 08-Mar-1961 DOA: 01/27/2021 PCP: Abner Greenspan, MD  Brief Narrative:  60 y.o. female with medical history significant of anxiety, depression, back pain, breast cancer, hypertension, CVA, lymphedema, hyperlipidemia, GERD, migraine, uses dentures who presents with ongoing abdominal pain.  Patient states that she has intermittent abdominal pain for about the past 3 months.  This pain as above comes and goes and it will radiate to her right upper quadrant from her epigastrium.  Pain described as a crampy and sharp pain with some associated nausea.  She states the pain is worse with eating.  She states that it would tend to feel better after vomiting.  The pain had been occurring about once a week until 2 weeks ago and it has been 2-3 times a week since then.  She also left work early twice this week.  She told her daughter who is a nurse about the pain earlier today who recommended she come to the ED for further evaluation.  Patient had LFT elevation.  follow-up right upper quadrant ultrasound shows markedly dilated common bile duct and 38mm concerning for CBD obstruction with stones in the bile duct and gallbladder sludge.  GI and general surgery were contacted from the emergency department.   Patient underwent successful ERCP with stent placement on 6/6.  Return to medical floor with recommendations for clear liquid diet today.  General surgery on board with tentative plan for cholecystectomy 6/8     Assessment & Plan:   Principal Problem:   Choledocholithiasis with acute cholecystitis Active Problems:   Pure hypercholesterolemia   ANXIETY DEPRESSION   Essential hypertension   Chronic back pain   History of CVA (cerebrovascular accident)  Choledocholithiasis with acute cholecystitis Elevated LFTs with hyperbilirubinemia Presented with 3 months of intermittent abdominal pain Associated with nausea and vomiting, worse after  eating Right upper quadrant ultrasound with CBD 19 mm and obstructive stones and gallbladder sludge Started on Zosyn in ED General surgery and GI consulted from ED Status post ERCP 6/6 Patient remained stable and did not develop signs of cholangitis General surgery on board with plans for laparoscopic cholecystectomy 6/8 Plan: Full liquid diet today Continue Zosyn Daily CMP Hold maintenance fluids As needed pain control Close monitoring of vital signs. N.p.o. after midnight for laparoscopic cholecystectomy 6/8  Hypokalemia Mild, asymptomatic Replaced in the ED Daily electrolytes, replace as necessary  History of CVA Hyperlipidemia Aspirin and statin on hold for now Can restart within a day or 2  GERD Daily PPI  Chronic back pain Home tramadol resumed  History of breast cancer Home anastrozole on hold for now   DVT prophylaxis: SCDs Code Status: Full Family Communication: Daughter Golda Acre [phone] on 6/5, bedside 6/6 Disposition Plan: Status is: Inpatient  Remains inpatient appropriate because:Inpatient level of care appropriate due to severity of illness   Dispo: The patient is from: Home              Anticipated d/c is to: Home              Patient currently is not medically stable to d/c.   Difficult to place patient No  Choledocholithiasis status post ERCP, stone retrieval, biliary stent placement.  Plan for cholecystectomy 6/8     Level of care: Med-Surg  Consultants:   GI  Procedures:   None  Antimicrobials:   Zosyn   Subjective: Patient seen and examined.  No pain.  Resting comfortably in bed.  No visible distress  Objective: Vitals:   01/30/21 0423 01/30/21 0500 01/30/21 0817 01/30/21 1316  BP: 110/71  (!) 155/80 (!) 153/83  Pulse: (!) 56  (!) 57 60  Resp: 16  18 18   Temp: 97.8 F (36.6 C)  97.6 F (36.4 C) 97.9 F (36.6 C)  TempSrc: Oral  Oral Oral  SpO2: 94%  98% 97%  Weight:  79.6 kg    Height:         Intake/Output Summary (Last 24 hours) at 01/30/2021 1344 Last data filed at 01/30/2021 0900 Gross per 24 hour  Intake 2105.88 ml  Output 175 ml  Net 1930.88 ml   Filed Weights   01/27/21 1515 01/27/21 2000 01/30/21 0500  Weight: 76 kg 71.7 kg 79.6 kg    Examination:  General exam: Appears calm and comfortable  Respiratory system: Clear to auscultation. Respiratory effort normal. Cardiovascular system: S1 & S2 heard, RRR. No JVD, murmurs, rubs, gallops or clicks. No pedal edema. Gastrointestinal system: Mild RUQ tender to palpation.  Nondistended.  No rebound or guarding.  Normal bowel sounds Central nervous system: Alert and oriented. No focal neurological deficits. Extremities: Symmetric 5 x 5 power. Skin: Appears jaundiced Psychiatry: Judgement and insight appear normal. Mood & affect appropriate.     Data Reviewed: I have personally reviewed following labs and imaging studies  CBC: Recent Labs  Lab 01/27/21 1525 01/28/21 0524 01/29/21 0609 01/30/21 0531  WBC 11.2* 9.1 6.2 6.8  NEUTROABS  --   --  3.2 3.4  HGB 12.9 11.9* 11.9* 11.1*  HCT 37.2 34.4* 34.0* 32.2*  MCV 95.9 96.9 96.3 98.5  PLT 264 240 231 732   Basic Metabolic Panel: Recent Labs  Lab 01/27/21 1525 01/27/21 1626 01/28/21 0524 01/29/21 0609 01/30/21 0531  NA 141  --  138 140 136  K 3.3*  --  3.7 3.7 3.5  CL 107  --  109 111 108  CO2 24  --  23 22 24   GLUCOSE 147*  --  120* 109* 111*  BUN 12  --  11 7 6   CREATININE 0.53  --  0.42* 0.37* 0.56  CALCIUM 9.1  --  8.4* 8.3* 8.4*  MG  --  1.8  --   --   --    GFR: Estimated Creatinine Clearance: 74.7 mL/min (by C-G formula based on SCr of 0.56 mg/dL). Liver Function Tests: Recent Labs  Lab 01/27/21 1525 01/28/21 0524 01/29/21 0609 01/30/21 0531  AST 128* 156* 94* 60*  ALT 185* 206* 168* 130*  ALKPHOS 435* 414* 337* 307*  BILITOT 3.8* 5.2* 3.4* 2.2*  PROT 6.6 5.7* 5.3* 5.1*  ALBUMIN 3.7 3.0* 2.8* 2.7*   Recent Labs  Lab 01/27/21 1525   LIPASE 39   No results for input(s): AMMONIA in the last 168 hours. Coagulation Profile: No results for input(s): INR, PROTIME in the last 168 hours. Cardiac Enzymes: No results for input(s): CKTOTAL, CKMB, CKMBINDEX, TROPONINI in the last 168 hours. BNP (last 3 results) No results for input(s): PROBNP in the last 8760 hours. HbA1C: No results for input(s): HGBA1C in the last 72 hours. CBG: No results for input(s): GLUCAP in the last 168 hours. Lipid Profile: No results for input(s): CHOL, HDL, LDLCALC, TRIG, CHOLHDL, LDLDIRECT in the last 72 hours. Thyroid Function Tests: No results for input(s): TSH, T4TOTAL, FREET4, T3FREE, THYROIDAB in the last 72 hours. Anemia Panel: No results for input(s): VITAMINB12, FOLATE, FERRITIN, TIBC, IRON, RETICCTPCT in the last 72 hours. Sepsis Labs:  No results for input(s): PROCALCITON, LATICACIDVEN in the last 168 hours.  Recent Results (from the past 240 hour(s))  Resp Panel by RT-PCR (Flu A&B, Covid) Nasopharyngeal Swab     Status: None   Collection Time: 01/27/21 10:54 PM   Specimen: Nasopharyngeal Swab; Nasopharyngeal(NP) swabs in vial transport medium  Result Value Ref Range Status   SARS Coronavirus 2 by RT PCR NEGATIVE NEGATIVE Final    Comment: (NOTE) SARS-CoV-2 target nucleic acids are NOT DETECTED.  The SARS-CoV-2 RNA is generally detectable in upper respiratory specimens during the acute phase of infection. The lowest concentration of SARS-CoV-2 viral copies this assay can detect is 138 copies/mL. A negative result does not preclude SARS-Cov-2 infection and should not be used as the sole basis for treatment or other patient management decisions. A negative result may occur with  improper specimen collection/handling, submission of specimen other than nasopharyngeal swab, presence of viral mutation(s) within the areas targeted by this assay, and inadequate number of viral copies(<138 copies/mL). A negative result must be combined  with clinical observations, patient history, and epidemiological information. The expected result is Negative.  Fact Sheet for Patients:  EntrepreneurPulse.com.au  Fact Sheet for Healthcare Providers:  IncredibleEmployment.be  This test is no t yet approved or cleared by the [place] FDA and  has been authorized for detection and/or diagnosis of SARS-CoV-2 by FDA under an Emergency Use Authorization (EUA). This EUA will remain  in effect (meaning this test can be used) for the duration of the COVID-19 declaration under Section 564(b)(1) of the Act, 21 U.S.C.section 360bbb-3(b)(1), unless the authorization is terminated  or revoked sooner.       Influenza A by PCR NEGATIVE NEGATIVE Final   Influenza B by PCR NEGATIVE NEGATIVE Final    Comment: (NOTE) The Xpert Xpress SARS-CoV-2/FLU/RSV plus assay is intended as an aid in the diagnosis of influenza from Nasopharyngeal swab specimens and should not be used as a sole basis for treatment. Nasal washings and aspirates are unacceptable for Xpert Xpress SARS-CoV-2/FLU/RSV testing.  Fact Sheet for Patients: EntrepreneurPulse.com.au  Fact Sheet for Healthcare Providers: IncredibleEmployment.be  This test is not yet approved or cleared by the [place] FDA and has been authorized for detection and/or diagnosis of SARS-CoV-2 by FDA under an Emergency Use Authorization (EUA). This EUA will remain in effect (meaning this test can be used) for the duration of the COVID-19 declaration under Section 564(b)(1) of the Act, 21 U.S.C. section 360bbb-3(b)(1), unless the authorization is terminated or revoked.  Performed at [facility], [address] [place], [place] [address]          Radiology Studies: DG C-Arm 1-60 Min-No Report  Result Date: 01/29/2021 Fluoroscopy was utilized by the requesting physician.  No radiographic interpretation.         Scheduled Meds: . nicotine  21 mg Transdermal Daily  . pantoprazole  40 mg Oral Daily  . sodium chloride flush  3 mL Intravenous Q12H   Continuous Infusions: . [START ON 01/31/2021] sodium chloride    . piperacillin-tazobactam (ZOSYN)  IV 3.375 g (01/30/21 0547)     LOS: 3 days    Time spent: 15 minutes    Sidney Ace, MD [facility] Pager [phone]  If 7PM-7AM, please contact night-coverage 01/30/2021, 1:44 PM

## 2021-01-30 NOTE — Progress Notes (Signed)
[facility] SURGICAL PROGRESS NOTE (cpt [id number])  Hospital Day(s): 3.   Post op day(s): 1 Day Post-Op.   Interval History: Patient seen and examined, no acute events or new complaints overnight. Patient reports she is feeling much better this morning. She denies fever, chills, nausea, emesis. She remains without leukocytosis. Hyperbilirubinemia improving, down to 2.2. She has been restarted on CLD and tolerating well. Continues on Zosyn.   Review of Systems:  Constitutional: denies fever, chills  HEENT: denies cough or congestion  Respiratory: denies any shortness of breath  Cardiovascular: denies chest pain or palpitations  Gastrointestinal: denies abdominal pain, N/V, or diarrhea/and bowel function as per interval history Genitourinary: denies burning with urination or urinary frequency  Vital signs in last 24 hours: [min-max] current  Temp:  [97.5 F (36.4 C)-98.8 F (37.1 C)] 97.8 F (36.6 C) (06/07 0423) Pulse Rate:  [56-78] 56 (06/07 0423) Resp:  [6-18] 16 (06/07 0423) BP: (110-149)/(66-88) 110/71 (06/07 0423) SpO2:  [94 %-100 %] 94 % (06/07 0423) Weight:  [79.6 kg] 79.6 kg (06/07 0500)     Height: 5' 2.99" (160 cm) Weight: 79.6 kg BMI (Calculated): 31.09   Intake/Output last 2 shifts:  06/06 0701 - 06/07 0700 In: 2588.1 [P.O.:780; I.V.:1657.1; IV Piggyback:151] Out: -    Physical Exam:  Constitutional: alert, cooperative and no distress  HENT: normocephalic without obvious abnormality  Eyes: PERRL, EOM's grossly intact and symmetric  Respiratory: breathing non-labored at rest  Cardiovascular: regular rate and sinus rhythm  Gastrointestinal: Soft, non-tender, and non-distended, no rebound/guarding  Musculoskeletal: no edema or wounds, motor and sensation grossly intact, NT    Labs:  CBC Latest Ref Rng & Units 01/30/2021 01/29/2021 01/28/2021  WBC 4.0 - 10.5 K/uL 6.8 6.2 9.1  Hemoglobin 12.0 - 15.0 g/dL 11.1(L) 11.9(L) 11.9(L)  Hematocrit 36.0 - 46.0 %  32.2(L) 34.0(L) 34.4(L)  Platelets 150 - 400 K/uL 202 231 240   CMP Latest Ref Rng & Units 01/30/2021 01/29/2021 01/28/2021  Glucose 70 - 99 mg/dL 111(H) 109(H) 120(H)  BUN 6 - 20 mg/dL 6 7 11   Creatinine 0.44 - 1.00 mg/dL 0.56 0.37(L) 0.42(L)  Sodium 135 - 145 mmol/L 136 140 138  Potassium 3.5 - 5.1 mmol/L 3.5 3.7 3.7  Chloride 98 - 111 mmol/L 108 111 109  CO2 22 - 32 mmol/L 24 22 23   Calcium 8.9 - 10.3 mg/dL 8.4(L) 8.3(L) 8.4(L)  Total Protein 6.5 - 8.1 g/dL 5.1(L) 5.3(L) 5.7(L)  Total Bilirubin 0.3 - 1.2 mg/dL 2.2(H) 3.4(H) 5.2(H)  Alkaline Phos 38 - 126 U/L 307(H) 337(H) 414(H)  AST 15 - 41 U/L 60(H) 94(H) 156(H)  ALT 0 - 44 U/L 130(H) 168(H) 206(H)     Imaging studies: No new pertinent imaging studies   Assessment/Plan: (ICD-10's: K80.50) 60 y.o. female with choledocholithiasis 1 day s/p ERCP.   - Okay for full liquids today; NPO at midnight  - Tentative plan for robotic assisted laparoscopic cholecystectomy tomorrow (06/08) with Dr Celine Ahr pending OR/Anesthesia availability  - All risks, benefits, and alternatives to above procedure(s) were discussed with the patient, all of her questions were answered to her expressed satisfaction, patient expresses she wishes to proceed, and informed consent was obtained.  - Continue IV Abx (Zosyn)  - Monitor abdominal examination    - Pain control prn; antiemetics prn              - Monitor hyperbilirubinemia; improving              - Further  management per primary service; we will follow     All of the above findings and recommendations were discussed with the patient, and the medical team, and all of patient's questions were answered to her expressed satisfaction.   -- Edison Simon, PA-C [facility] 01/30/2021, 7:32 AM [phone] M-F: 7am - 4pm

## 2021-01-30 NOTE — Progress Notes (Signed)
Amy Antigua, MD [address], [place], [place], [place], [address] [address], [place], [place], [place], [address] Phone: [phone]  Fax: [phone]   Subjective: Patient resting in bed comfortably.  No abdominal pain.  No nausea vomiting.  Reports flatus.  Objective: Exam: Vital signs in last 24 hours: Vitals:   01/29/21 2340 01/30/21 0423 01/30/21 0500 01/30/21 0817  BP: 136/80 110/71  (!) 155/80  Pulse: 60 (!) 56  (!) 57  Resp: 18 16  18   Temp: 97.6 F (36.4 C) 97.8 F (36.6 C)  97.6 F (36.4 C)  TempSrc: Oral Oral  Oral  SpO2: 99% 94%  98%  Weight:   79.6 kg   Height:       Weight change:   Intake/Output Summary (Last 24 hours) at 01/30/2021 5638 Last data filed at 01/30/2021 0548 Gross per 24 hour  Intake 2588.07 ml  Output --  Net 2588.07 ml    General: No acute distress, AAO x3 Abd: Soft, NT/ND, No HSM Skin: Warm, no rashes Neck: Supple, Trachea midline   Lab Results: Lab Results  Component Value Date   WBC 6.8 01/30/2021   HGB 11.1 (L) 01/30/2021   HCT 32.2 (L) 01/30/2021   MCV 98.5 01/30/2021   PLT 202 01/30/2021   Micro Results: Recent Results (from the past 240 hour(s))  Resp Panel by RT-PCR (Flu A&B, Covid) Nasopharyngeal Swab     Status: None   Collection Time: 01/27/21 10:54 PM   Specimen: Nasopharyngeal Swab; Nasopharyngeal(NP) swabs in vial transport medium  Result Value Ref Range Status   SARS Coronavirus 2 by RT PCR NEGATIVE NEGATIVE Final    Comment: (NOTE) SARS-CoV-2 target nucleic acids are NOT DETECTED.  The SARS-CoV-2 RNA is generally detectable in upper respiratory specimens during the acute phase of infection. The lowest concentration of SARS-CoV-2 viral copies this assay can detect is 138 copies/mL. A negative result does not preclude SARS-Cov-2 infection and should not be used as the sole basis for treatment or other patient management decisions. A negative result may occur with  improper specimen  collection/handling, submission of specimen other than nasopharyngeal swab, presence of viral mutation(s) within the areas targeted by this assay, and inadequate number of viral copies(<138 copies/mL). A negative result must be combined with clinical observations, patient history, and epidemiological information. The expected result is Negative.  Fact Sheet for Patients:  EntrepreneurPulse.com.au  Fact Sheet for Healthcare Providers:  IncredibleEmployment.be  This test is no t yet approved or cleared by the [place] FDA and  has been authorized for detection and/or diagnosis of SARS-CoV-2 by FDA under an Emergency Use Authorization (EUA). This EUA will remain  in effect (meaning this test can be used) for the duration of the COVID-19 declaration under Section 564(b)(1) of the Act, 21 U.S.C.section 360bbb-3(b)(1), unless the authorization is terminated  or revoked sooner.       Influenza A by PCR NEGATIVE NEGATIVE Final   Influenza B by PCR NEGATIVE NEGATIVE Final    Comment: (NOTE) The Xpert Xpress SARS-CoV-2/FLU/RSV plus assay is intended as an aid in the diagnosis of influenza from Nasopharyngeal swab specimens and should not be used as a sole basis for treatment. Nasal washings and aspirates are unacceptable for Xpert Xpress SARS-CoV-2/FLU/RSV testing.  Fact Sheet for Patients: EntrepreneurPulse.com.au  Fact Sheet for Healthcare Providers: IncredibleEmployment.be  This test is not yet approved or cleared by the [place] FDA and has been authorized for detection and/or diagnosis of SARS-CoV-2 by FDA under  an Emergency Use Authorization (EUA). This EUA will remain in effect (meaning this test can be used) for the duration of the COVID-19 declaration under Section 564(b)(1) of the Act, 21 U.S.C. section 360bbb-3(b)(1), unless the authorization is terminated or revoked.  Performed at [facility], [address], [place], [place] [address]    Studies/Results: DG C-Arm 1-60 Min-No Report  Result Date: 01/29/2021 Fluoroscopy was utilized by the requesting physician.  No radiographic interpretation.   Medications:  Scheduled Meds: . nicotine  21 mg Transdermal Daily  . pantoprazole  40 mg Oral Daily  . sodium chloride flush  3 mL Intravenous Q12H   Continuous Infusions: . sodium chloride 75 mL/hr at 01/30/21 0548  . piperacillin-tazobactam (ZOSYN)  IV 3.375 g (01/30/21 0547)   PRN Meds:.acetaminophen **OR** acetaminophen, menthol-cetylpyridinium, traMADol   Assessment: Principal Problem:   Choledocholithiasis with acute cholecystitis Active Problems:   Pure hypercholesterolemia   ANXIETY DEPRESSION   Essential hypertension   Chronic back pain   History of CVA (cerebrovascular accident)    Plan: Patient doing well post ERCP yesterday with no evidence of pancreatitis Cholecystectomy is planned by surgery for tomorrow  Patient tolerating oral diet without difficulty Patient will need stent removal in the near future which will be scheduled as an outpatient and CBD may need to be evaluated at that time given that 1 stone was not able to be removed during the procedure  Liver enzymes have improved today.  Continue to avoid hepatotoxic drugs and follow CMP     LOS: 3 days   Amy Antigua, MD 01/30/2021, 8:19 AM

## 2021-01-31 ENCOUNTER — Inpatient Hospital Stay: Payer: BC Managed Care – PPO | Admitting: Anesthesiology

## 2021-01-31 ENCOUNTER — Encounter
Admission: EM | Disposition: A | Discharge status: Home / Self Care | Payer: Self-pay | Source: Home / Self Care | Attending: Internal Medicine

## 2021-01-31 ENCOUNTER — Encounter: Payer: Self-pay | Admitting: Internal Medicine

## 2021-01-31 DIAGNOSIS — K8064 Calculus of gallbladder and bile duct with chronic cholecystitis without obstruction: Secondary | ICD-10-CM

## 2021-01-31 LAB — COMPREHENSIVE METABOLIC PANEL
ALT: 104 U/L — ABNORMAL HIGH (ref 0–44)
AST: 39 U/L (ref 15–41)
Albumin: 2.7 g/dL — ABNORMAL LOW (ref 3.5–5.0)
Alkaline Phosphatase: 265 U/L — ABNORMAL HIGH (ref 38–126)
Anion gap: 9 (ref 5–15)
BUN: <5 mg/dL — ABNORMAL LOW (ref 6–20)
CO2: 25 mmol/L (ref 22–32)
Calcium: 8.5 mg/dL — ABNORMAL LOW (ref 8.9–10.3)
Chloride: 107 mmol/L (ref 98–111)
Creatinine, Ser: 0.55 mg/dL (ref 0.44–1.00)
GFR, Estimated: >60 mL/min (ref >60)
Glucose, Bld: 115 mg/dL — ABNORMAL HIGH (ref 70–99)
Potassium: 3.4 mmol/L — ABNORMAL LOW (ref 3.5–5.1)
Sodium: 141 mmol/L (ref 135–145)
Total Bilirubin: 1.5 mg/dL — ABNORMAL HIGH (ref 0.3–1.2)
Total Protein: 5.2 g/dL — ABNORMAL LOW (ref 6.5–8.1)

## 2021-01-31 LAB — CBC WITH DIFFERENTIAL/PLATELET
Abs Immature Granulocytes: 0.03 10*3/uL (ref 0.00–0.07)
Basophils Absolute: 0 10*3/uL (ref 0.0–0.1)
Basophils Relative: 0 %
Eosinophils Absolute: 0.1 10*3/uL (ref 0.0–0.5)
Eosinophils Relative: 1 %
HCT: 33.3 % — ABNORMAL LOW (ref 36.0–46.0)
Hemoglobin: 11.5 g/dL — ABNORMAL LOW (ref 12.0–15.0)
Immature Granulocytes: 0 %
Lymphocytes Relative: 34 %
Lymphs Abs: 2.6 10*3/uL (ref 0.7–4.0)
MCH: 33.3 pg (ref 26.0–34.0)
MCHC: 34.5 g/dL (ref 30.0–36.0)
MCV: 96.5 fL (ref 80.0–100.0)
Monocytes Absolute: 0.7 10*3/uL (ref 0.1–1.0)
Monocytes Relative: 9 %
Neutro Abs: 4.2 10*3/uL (ref 1.7–7.7)
Neutrophils Relative %: 56 %
Platelets: 235 10*3/uL (ref 150–400)
RBC: 3.45 MIL/uL — ABNORMAL LOW (ref 3.87–5.11)
RDW: 14.4 % (ref 11.5–15.5)
WBC: 7.6 10*3/uL (ref 4.0–10.5)
nRBC: 0 % (ref 0.0–0.2)

## 2021-01-31 SURGERY — CHOLECYSTECTOMY, ROBOT-ASSISTED, LAPAROSCOPIC
Anesthesia: General

## 2021-01-31 MED ORDER — BUPIVACAINE HCL (PF) 0.25 % IJ SOLN
INTRAMUSCULAR | Status: AC
  Filled 2021-01-31: qty 30

## 2021-01-31 MED ORDER — ACETAMINOPHEN 10 MG/ML IV SOLN: 1000.0000 mg | Freq: Once | INTRAVENOUS | Status: DC | PRN

## 2021-01-31 MED ORDER — ONDANSETRON HCL 4 MG/2ML IJ SOLN
4.0000 mg | Freq: Once | INTRAMUSCULAR | Status: AC | PRN
  Administered 2021-01-31: 4 mg via INTRAVENOUS

## 2021-01-31 MED ORDER — FENTANYL CITRATE (PF) 100 MCG/2ML IJ SOLN
INTRAMUSCULAR | Status: AC
  Filled 2021-01-31: qty 2

## 2021-01-31 MED ORDER — ROCURONIUM BROMIDE 100 MG/10ML IV SOLN
INTRAVENOUS | Status: DC | PRN
  Administered 2021-01-31: 50 mg via INTRAVENOUS
  Administered 2021-01-31: 20 mg via INTRAVENOUS

## 2021-01-31 MED ORDER — MIDAZOLAM HCL 2 MG/2ML IJ SOLN
INTRAMUSCULAR | Status: DC | PRN
  Administered 2021-01-31: 2 mg via INTRAVENOUS

## 2021-01-31 MED ORDER — VISTASEAL 10 ML SINGLE DOSE KIT
PACK | CUTANEOUS | Status: AC
  Filled 2021-01-31: qty 10

## 2021-01-31 MED ORDER — ONDANSETRON HCL 4 MG/2ML IJ SOLN
INTRAMUSCULAR | Status: AC
  Filled 2021-01-31: qty 2

## 2021-01-31 MED ORDER — PROPOFOL 10 MG/ML IV BOLUS
INTRAVENOUS | Status: DC | PRN
  Administered 2021-01-31: 150 mg via INTRAVENOUS

## 2021-01-31 MED ORDER — LACTATED RINGERS IV SOLN: INTRAVENOUS | Status: DC | PRN

## 2021-01-31 MED ORDER — ONDANSETRON HCL 4 MG/2ML IJ SOLN
INTRAMUSCULAR | Status: DC | PRN
  Administered 2021-01-31: 4 mg via INTRAVENOUS

## 2021-01-31 MED ORDER — FENTANYL CITRATE (PF) 100 MCG/2ML IJ SOLN
25.0000 ug | INTRAMUSCULAR | Status: DC | PRN
  Administered 2021-01-31: 25 ug via INTRAVENOUS

## 2021-01-31 MED ORDER — LIDOCAINE HCL (PF) 2 % IJ SOLN
INTRAMUSCULAR | Status: AC
  Filled 2021-01-31: qty 4

## 2021-01-31 MED ORDER — PROPOFOL 10 MG/ML IV BOLUS
INTRAVENOUS | Status: AC
  Filled 2021-01-31: qty 20

## 2021-01-31 MED ORDER — SUGAMMADEX SODIUM 200 MG/2ML IV SOLN
INTRAVENOUS | Status: DC | PRN
  Administered 2021-01-31: 200 mg via INTRAVENOUS

## 2021-01-31 MED ORDER — INDOCYANINE GREEN 25 MG IV SOLR
7.5000 mg | INTRAVENOUS | Status: AC
  Administered 2021-01-31: 7.5 mg via INTRAVENOUS
  Filled 2021-01-31: qty 3

## 2021-01-31 MED ORDER — PIPERACILLIN-TAZOBACTAM 3.375 G IVPB
INTRAVENOUS | Status: AC
  Filled 2021-01-31: qty 50

## 2021-01-31 MED ORDER — POTASSIUM CHLORIDE 10 MEQ/100ML IV SOLN
10.0000 meq | INTRAVENOUS | Status: DC
  Administered 2021-01-31 (×3): 10 meq via INTRAVENOUS
  Filled 2021-01-31 (×3): qty 100

## 2021-01-31 MED ORDER — MIDAZOLAM HCL 2 MG/2ML IJ SOLN
INTRAMUSCULAR | Status: AC
  Filled 2021-01-31: qty 2

## 2021-01-31 MED ORDER — ACETAMINOPHEN 10 MG/ML IV SOLN
INTRAVENOUS | Status: DC | PRN
  Administered 2021-01-31: 1000 mg via INTRAVENOUS

## 2021-01-31 MED ORDER — LIDOCAINE HCL (CARDIAC) PF 100 MG/5ML IV SOSY
PREFILLED_SYRINGE | INTRAVENOUS | Status: DC | PRN
  Administered 2021-01-31: 50 mg via INTRAVENOUS

## 2021-01-31 MED ORDER — ACETAMINOPHEN 10 MG/ML IV SOLN
INTRAVENOUS | Status: AC
  Filled 2021-01-31: qty 100

## 2021-01-31 MED ORDER — DEXAMETHASONE SODIUM PHOSPHATE 10 MG/ML IJ SOLN
INTRAMUSCULAR | Status: AC
  Filled 2021-01-31: qty 1

## 2021-01-31 MED ORDER — FENTANYL CITRATE (PF) 100 MCG/2ML IJ SOLN
INTRAMUSCULAR | Status: DC | PRN
  Administered 2021-01-31 (×4): 50 ug via INTRAVENOUS

## 2021-01-31 MED ORDER — LIDOCAINE-EPINEPHRINE 1 %-1:100000 IJ SOLN
INTRAMUSCULAR | Status: DC | PRN
  Administered 2021-01-31: 20 mL via INTRAMUSCULAR

## 2021-01-31 MED ORDER — DEXAMETHASONE SODIUM PHOSPHATE 10 MG/ML IJ SOLN
INTRAMUSCULAR | Status: DC | PRN
  Administered 2021-01-31: 10 mg via INTRAVENOUS

## 2021-01-31 MED ORDER — LIDOCAINE-EPINEPHRINE 1 %-1:100000 IJ SOLN
INTRAMUSCULAR | Status: AC
  Filled 2021-01-31: qty 1

## 2021-01-31 MED ORDER — HEMOSTATIC AGENTS (NO CHARGE) OPTIME
TOPICAL | Status: DC | PRN
  Administered 2021-01-31: 1 via TOPICAL

## 2021-01-31 SURGICAL SUPPLY — 65 items
ADH SKN CLS APL DERMABOND .7 (GAUZE/BANDAGES/DRESSINGS) ×2
APL LAPSCP 35 DL APL RGD (MISCELLANEOUS) ×2
APL PRP STRL LF DISP 70% ISPRP (MISCELLANEOUS) ×2
APPLICATOR VISTASEAL 35 (MISCELLANEOUS) ×2 IMPLANT
BAG INFUSER PRESSURE 100CC (MISCELLANEOUS) ×3 IMPLANT
BAG SPEC RTRVL LRG 6X4 10 (ENDOMECHANICALS) ×2
BLADE SURG SZ11 CARB STEEL (BLADE) ×3 IMPLANT
CANISTER SUCT 1200ML W/VALVE (MISCELLANEOUS) ×2 IMPLANT
CANNULA REDUC XI 12-8 STAPL (CANNULA) ×3
CANNULA REDUCER 12-8 DVNC XI (CANNULA) ×2 IMPLANT
CHLORAPREP W/TINT 26 (MISCELLANEOUS) ×3 IMPLANT
CLIP VESOLOCK MED LG 6/CT (CLIP) ×3 IMPLANT
COVER TIP SHEARS 8 DVNC (MISCELLANEOUS) ×2 IMPLANT
COVER TIP SHEARS 8MM DA VINCI (MISCELLANEOUS) ×3
COVER WAND RF STERILE (DRAPES) ×3 IMPLANT
DECANTER SPIKE VIAL GLASS SM (MISCELLANEOUS) ×6 IMPLANT
DEFOGGER SCOPE WARMER CLEARIFY (MISCELLANEOUS) ×3 IMPLANT
DERMABOND ADVANCED (GAUZE/BANDAGES/DRESSINGS) ×1
DERMABOND ADVANCED .7 DNX12 (GAUZE/BANDAGES/DRESSINGS) ×2 IMPLANT
DRAPE ARM DVNC X/XI (DISPOSABLE) ×8 IMPLANT
DRAPE COLUMN DVNC XI (DISPOSABLE) ×2 IMPLANT
DRAPE DA VINCI XI ARM (DISPOSABLE) ×12
DRAPE DA VINCI XI COLUMN (DISPOSABLE) ×3
ELECT CAUTERY BLADE TIP 2.5 (TIP) ×3
ELECT REM PT RETURN 9FT ADLT (ELECTROSURGICAL) ×3
ELECTRODE CAUTERY BLDE TIP 2.5 (TIP) ×2 IMPLANT
ELECTRODE REM PT RTRN 9FT ADLT (ELECTROSURGICAL) ×2 IMPLANT
GLOVE SURG ENC MOIS LTX SZ6.5 (GLOVE) ×12 IMPLANT
GLOVE SURG UNDER LTX SZ7 (GLOVE) ×10 IMPLANT
GOWN STRL REUS W/ TWL LRG LVL3 (GOWN DISPOSABLE) ×7 IMPLANT
GOWN STRL REUS W/TWL LRG LVL3 (GOWN DISPOSABLE) ×12
GRASPER SUT TROCAR 14GX15 (MISCELLANEOUS) IMPLANT
HEMOSTAT SURGICEL 2X3 (HEMOSTASIS) ×3 IMPLANT
IRRIGATOR SUCT 8 DISP DVNC XI (IRRIGATION / IRRIGATOR) IMPLANT
IRRIGATOR SUCTION 8MM XI DISP (IRRIGATION / IRRIGATOR)
IV NS 1000ML (IV SOLUTION)
IV NS 1000ML BAXH (IV SOLUTION) IMPLANT
KIT PINK PAD W/HEAD ARE REST (MISCELLANEOUS) ×3
KIT PINK PAD W/HEAD ARM REST (MISCELLANEOUS) ×2 IMPLANT
LABEL OR SOLS (LABEL) ×3 IMPLANT
MANIFOLD NEPTUNE II (INSTRUMENTS) ×3 IMPLANT
NDL INSUFFLATION 14GA 120MM (NEEDLE) IMPLANT
NEEDLE HYPO 22GX1.5 SAFETY (NEEDLE) ×3 IMPLANT
NEEDLE INSUFFLATION 14GA 120MM (NEEDLE) IMPLANT
NS IRRIG 500ML POUR BTL (IV SOLUTION) ×3 IMPLANT
OBTURATOR OPTICAL STANDARD 8MM (TROCAR) ×3
OBTURATOR OPTICAL STND 8 DVNC (TROCAR) ×2
OBTURATOR OPTICALSTD 8 DVNC (TROCAR) ×2 IMPLANT
PACK LAP CHOLECYSTECTOMY (MISCELLANEOUS) ×3 IMPLANT
PENCIL ELECTRO HAND CTR (MISCELLANEOUS) ×3 IMPLANT
POUCH SPECIMEN RETRIEVAL 10MM (ENDOMECHANICALS) ×3 IMPLANT
SEAL CANN UNIV 5-8 DVNC XI (MISCELLANEOUS) ×6 IMPLANT
SEAL XI 5MM-8MM UNIVERSAL (MISCELLANEOUS) ×9
SET TUBE SMOKE EVAC HIGH FLOW (TUBING) ×3 IMPLANT
SOLUTION ELECTROLUBE (MISCELLANEOUS) ×3 IMPLANT
STAPLER CANNULA SEAL DVNC XI (STAPLE) ×2 IMPLANT
STAPLER CANNULA SEAL XI (STAPLE) ×3
STRIP CLOSURE SKIN 1/2X4 (GAUZE/BANDAGES/DRESSINGS) ×3 IMPLANT
SUT MNCRL 4-0 (SUTURE) ×3
SUT MNCRL 4-0 27XMFL (SUTURE) ×2
SUT VIC AB 3-0 SH 27 (SUTURE)
SUT VIC AB 3-0 SH 27X BRD (SUTURE) IMPLANT
SUT VICRYL 0 AB UR-6 (SUTURE) IMPLANT
SUTURE MNCRL 4-0 27XMF (SUTURE) ×2 IMPLANT
TROCAR XCEL NON-BLD 5MMX100MML (ENDOMECHANICALS) IMPLANT

## 2021-01-31 NOTE — Anesthesia Procedure Notes (Signed)
Procedure Name: Intubation Date/Time: 01/31/2021 2:37 PM Performed by: Philbert Riser, CRNA Pre-anesthesia Checklist: Patient identified, Emergency Drugs available, Suction available and Patient being monitored Patient Re-evaluated:Patient Re-evaluated prior to induction Oxygen Delivery Method: Circle system utilized Preoxygenation: Pre-oxygenation with 100% oxygen Induction Type: IV induction Ventilation: Mask ventilation without difficulty Laryngoscope Size: McGraph and 3 Grade View: Grade I Tube type: Oral Number of attempts: 1 Airway Equipment and Method: Stylet and Oral airway Placement Confirmation: ETT inserted through vocal cords under direct vision,  positive ETCO2 and breath sounds checked- equal and bilateral Secured at: 21 cm Tube secured with: Tape Dental Injury: Teeth and Oropharynx as per pre-operative assessment

## 2021-01-31 NOTE — Transfer of Care (Signed)
Immediate Anesthesia Transfer of Care Note  Patient: Amy Leonard  Procedure(s) Performed: XI ROBOTIC ASSISTED LAPAROSCOPIC CHOLECYSTECTOMY (N/A ) INDOCYANINE GREEN FLUORESCENCE IMAGING (ICG)  Patient Location: PACU  Anesthesia Type:General  Level of Consciousness: awake and alert   Airway & Oxygen Therapy: Patient Spontanous Breathing and Patient connected to nasal cannula oxygen  Post-op Assessment: Report given to RN and Post -op Vital signs reviewed and stable  Post vital signs: Reviewed and stable  Last Vitals:  Vitals Value Taken Time  BP 146/77 01/31/21 1623  Temp 36.1 C 01/31/21 1626  Pulse 88 01/31/21 1628  Resp 21 01/31/21 1628  SpO2 100 % 01/31/21 1628  Vitals shown include unvalidated device data.  Last Pain:  Vitals:   01/31/21 1114  TempSrc: Oral  PainSc: 0-No pain      Patients Stated Pain Goal: 0 (85/92/92 4462)  Complications: No complications documented.

## 2021-01-31 NOTE — Progress Notes (Signed)
Mobility Specialist - Progress Note   01/31/21 1200  Mobility  Activity Off unit  Mobility performed by Mobility specialist    Pt off unit for procedure at this time. Will attempt session another date/time as pt becomes available.    Kathee Delton Mobility Specialist 01/31/21, 12:46 PM

## 2021-01-31 NOTE — Progress Notes (Addendum)
[facility] Day(s): 4.   Post op day(s): 2 Days Post-Op.   Interval History:  Patient seen and examined No acute events or new complaints overnight.  Patient reports she is doing well this morning No abdominal pain, nausea, emesis Remains without leukocytosis; 7.6K this morning Renal function remains normal; sCr - 0.55; UO - 175 ccs + unmeasured Mild hypokalemia 3.4 Hyperbilirubinemia improving; down to 1.5 Continues on zosyn NPO currently for planned procedure  Vital signs in last 24 hours: [min-max] current  Temp:  [97.6 F (36.4 C)-98 F (36.7 C)] 98 F (36.7 C) (06/08 0409) Pulse Rate:  [57-66] 64 (06/08 0409) Resp:  [16-18] 16 (06/08 0409) BP: (130-155)/(67-83) 130/67 (06/08 0409) SpO2:  [97 %-100 %] 100 % (06/08 0409)     Height: 5' 2.99" (160 cm) Weight: 79.6 kg BMI (Calculated): 31.09   Intake/Output last 2 shifts:  06/07 0701 - 06/08 0700 In: 1514.1 [P.O.:1080; I.V.:284; IV Piggyback:150.1] Out: 175 [Urine:175]   Physical Exam:  Constitutional: alert, cooperative and no distress  HENT: normocephalic without obvious abnormality  Eyes: PERRL, EOM's grossly intact and symmetric  Respiratory: breathing non-labored at rest  Cardiovascular: regular rate and sinus rhythm  Gastrointestinal: Soft, non-tender, and non-distended, no rebound/guarding  Musculoskeletal: no edema or wounds, motor and sensation grossly intact, NT   Labs:  CBC Latest Ref Rng & Units 01/31/2021 01/30/2021 01/29/2021  WBC 4.0 - 10.5 K/uL 7.6 6.8 6.2  Hemoglobin 12.0 - 15.0 g/dL 11.5(L) 11.1(L) 11.9(L)  Hematocrit 36.0 - 46.0 % 33.3(L) 32.2(L) 34.0(L)  Platelets 150 - 400 K/uL 235 202 231   CMP Latest Ref Rng & Units 01/31/2021 01/30/2021 01/29/2021  Glucose 70 - 99 mg/dL 115(H) 111(H) 109(H)  BUN 6 - 20 mg/dL <5(L) 6 7  Creatinine 0.44 - 1.00 mg/dL 0.55 0.56 0.37(L)  Sodium 135 - 145 mmol/L 141 136 140  Potassium 3.5 - 5.1 mmol/L 3.4(L) 3.5 3.7   Chloride 98 - 111 mmol/L 107 108 111  CO2 22 - 32 mmol/L 25 24 22   Calcium 8.9 - 10.3 mg/dL 8.5(L) 8.4(L) 8.3(L)  Total Protein 6.5 - 8.1 g/dL 5.2(L) 5.1(L) 5.3(L)  Total Bilirubin 0.3 - 1.2 mg/dL 1.5(H) 2.2(H) 3.4(H)  Alkaline Phos 38 - 126 U/L 265(H) 307(H) 337(H)  AST 15 - 41 U/L 39 60(H) 94(H)  ALT 0 - 44 U/L 104(H) 130(H) 168(H)    Imaging studies: No new pertinent imaging studies   Assessment/Plan: (ICD-10's: K4.50) 60 y.o. female with choledocholithiasis 2 days s/p ERCP   - NPO today; okay to resume CLD post-op and advance as tolerated             - Plan for robotic assisted laparoscopic cholecystectomy today (06/08) with Dr Celine Ahr pending OR/Anesthesia availability             - All risks, benefits, and alternatives to above procedure(s) were discussed with the patient, all of her questions were answered to her expressed satisfaction, patient expresses she wishes to proceed, and informed consent was obtained.             - Continue IV Abx (Zosyn)             - Monitor abdominal examination               - Pain control prn; antiemetics prn              - Monitor hyperbilirubinemia; improving              -  Further management per primary service; we will follow     All of the above findings and recommendations were discussed with the patient, patient's family (daughter at bedside), and the medical team, and all of their questions were answered to their expressed satisfaction.  -- Edison Simon, PA-C [facility] 01/31/2021, 7:36 AM [phone] M-F: 7am - 4pm  I saw and evaluated the patient.  I agree with the above documentation, exam, and plan, which I have edited where appropriate. Fredirick Maudlin  1:56 PM

## 2021-01-31 NOTE — Anesthesia Postprocedure Evaluation (Signed)
Anesthesia Post Note  Patient: Amy Leonard  Procedure(s) Performed: XI ROBOTIC ASSISTED LAPAROSCOPIC CHOLECYSTECTOMY (N/A ) INDOCYANINE GREEN FLUORESCENCE IMAGING (ICG)  Patient location during evaluation: PACU Anesthesia Type: General Level of consciousness: awake and alert Pain management: pain level controlled Vital Signs Assessment: post-procedure vital signs reviewed and stable Respiratory status: spontaneous breathing and respiratory function stable Cardiovascular status: stable Anesthetic complications: no   No complications documented.   Last Vitals:  Vitals:   01/31/21 1722 01/31/21 1800  BP: (!) 141/72 (!) 143/77  Pulse: 78 84  Resp: 16 16  Temp: (!) 36.4 C   SpO2:  92%    Last Pain:  Vitals:   01/31/21 1747  TempSrc:   PainSc: 5                  Rhen Dossantos K

## 2021-01-31 NOTE — Progress Notes (Signed)
Vonda Antigua, MD [address], [place], [place], [place], [address] [address], [place], [place], [place], [address] Phone: [phone]  Fax: [phone]   Subjective:  Patient denies any further abdominal pain.  No nausea or vomiting.  Tolerating oral diet.  Receiving potassium this morning due to low potassium.  Awaiting surgery.  Objective: Exam: Vital signs in last 24 hours: Vitals:   01/30/21 2008 01/30/21 2317 01/31/21 0409 01/31/21 0745  BP: (!) 149/78 136/80 130/67 (!) 154/79  Pulse: (!) 59 66 64 64  Resp: 16 16 16 16   Temp: 98 F (36.7 C) 97.7 F (36.5 C) 98 F (36.7 C) 98.1 F (36.7 C)  TempSrc: Oral Oral Oral Oral  SpO2: 99% 99% 100% 93%  Weight:      Height:       Weight change:   Intake/Output Summary (Last 24 hours) at 01/31/2021 1024 Last data filed at 01/31/2021 0302 Gross per 24 hour  Intake 1274.14 ml  Output --  Net 1274.14 ml    General: No acute distress, AAO x3 Abd: Soft, NT/ND, No HSM Skin: Warm, no rashes Neck: Supple, Trachea midline   Lab Results: Lab Results  Component Value Date   WBC 7.6 01/31/2021   HGB 11.5 (L) 01/31/2021   HCT 33.3 (L) 01/31/2021   MCV 96.5 01/31/2021   PLT 235 01/31/2021   Micro Results: Recent Results (from the past 240 hour(s))  Resp Panel by RT-PCR (Flu A&B, Covid) Nasopharyngeal Swab     Status: None   Collection Time: 01/27/21 10:54 PM   Specimen: Nasopharyngeal Swab; Nasopharyngeal(NP) swabs in vial transport medium  Result Value Ref Range Status   SARS Coronavirus 2 by RT PCR NEGATIVE NEGATIVE Final    Comment: (NOTE) SARS-CoV-2 target nucleic acids are NOT DETECTED.  The SARS-CoV-2 RNA is generally detectable in upper respiratory specimens during the acute phase of infection. The lowest concentration of SARS-CoV-2 viral copies this assay can detect is 138 copies/mL. A negative result does not preclude SARS-Cov-2 infection and should not be used as the sole basis for treatment  or other patient management decisions. A negative result may occur with  improper specimen collection/handling, submission of specimen other than nasopharyngeal swab, presence of viral mutation(s) within the areas targeted by this assay, and inadequate number of viral copies(<138 copies/mL). A negative result must be combined with clinical observations, patient history, and epidemiological information. The expected result is Negative.  Fact Sheet for Patients:  EntrepreneurPulse.com.au  Fact Sheet for Healthcare Providers:  IncredibleEmployment.be  This test is no t yet approved or cleared by the [place] FDA and  has been authorized for detection and/or diagnosis of SARS-CoV-2 by FDA under an Emergency Use Authorization (EUA). This EUA will remain  in effect (meaning this test can be used) for the duration of the COVID-19 declaration under Section 564(b)(1) of the Act, 21 U.S.C.section 360bbb-3(b)(1), unless the authorization is terminated  or revoked sooner.       Influenza A by PCR NEGATIVE NEGATIVE Final   Influenza B by PCR NEGATIVE NEGATIVE Final    Comment: (NOTE) The Xpert Xpress SARS-CoV-2/FLU/RSV plus assay is intended as an aid in the diagnosis of influenza from Nasopharyngeal swab specimens and should not be used as a sole basis for treatment. Nasal washings and aspirates are unacceptable for Xpert Xpress SARS-CoV-2/FLU/RSV testing.  Fact Sheet for Patients: EntrepreneurPulse.com.au  Fact Sheet for Healthcare Providers: IncredibleEmployment.be  This test is not yet approved or cleared by the [place]  FDA and has been authorized for detection and/or diagnosis of SARS-CoV-2 by FDA under an Emergency Use Authorization (EUA). This EUA will remain in effect (meaning this test can be used) for the duration of the COVID-19 declaration under Section 564(b)(1) of the Act, 21 U.S.C. section  360bbb-3(b)(1), unless the authorization is terminated or revoked.  Performed at [facility], [address], [place], [place] [address]    Studies/Results: DG C-Arm 1-60 Min-No Report  Result Date: 01/29/2021 Fluoroscopy was utilized by the requesting physician.  No radiographic interpretation.   Medications:  Scheduled Meds: . indocyanine green  7.5 mg Intravenous On Call to OR  . nicotine  21 mg Transdermal Daily  . pantoprazole  40 mg Oral Daily  . sodium chloride flush  3 mL Intravenous Q12H   Continuous Infusions: . sodium chloride 75 mL/hr at 01/31/21 0406  . piperacillin-tazobactam (ZOSYN)  IV 3.375 g (01/31/21 0510)  . potassium chloride 10 mEq (01/31/21 0952)   PRN Meds:.acetaminophen **OR** acetaminophen, menthol-cetylpyridinium, traMADol   Assessment: Principal Problem:   Choledocholithiasis with acute cholecystitis Active Problems:   Pure hypercholesterolemia   ANXIETY DEPRESSION   Essential hypertension   Chronic back pain   History of CVA (cerebrovascular accident) Elevated liver enzymes   Plan: Patient's liver enzymes are improving, although still elevated Continue to expect improvement over the next few days, and follow until normalized  Proceed with surgery as planned  No evidence of pancreatitis   LOS: 4 days   Vonda Antigua, MD 01/31/2021, 10:24 AM

## 2021-01-31 NOTE — Progress Notes (Signed)
PROGRESS NOTE    Amy Leonard  EVO:[id number] DOB: 10/04/1960 DOA: 01/27/2021 PCP: Abner Greenspan, MD  Brief Narrative:  60 y.o. female with medical history significant of anxiety, depression, back pain, breast cancer, hypertension, CVA, lymphedema, hyperlipidemia, GERD, migraine, uses dentures who presents with ongoing abdominal pain.  Patient states that she has intermittent abdominal pain for about the past 3 months.  This pain as above comes and goes and it will radiate to her right upper quadrant from her epigastrium.  Pain described as a crampy and sharp pain with some associated nausea.  She states the pain is worse with eating.  She states that it would tend to feel better after vomiting.  The pain had been occurring about once a week until 2 weeks ago and it has been 2-3 times a week since then.  She also left work early twice this week.  She told her daughter who is a nurse about the pain earlier today who recommended she come to the ED for further evaluation.  Patient had LFT elevation.  follow-up right upper quadrant ultrasound shows markedly dilated common bile duct and 74mm concerning for CBD obstruction with stones in the bile duct and gallbladder sludge.  GI and general surgery were contacted from the emergency department.   Patient underwent successful ERCP with stent placement on 6/6.  Return to medical floor with recommendations for clear liquid diet today.  General surgery on board with tentative plan for cholecystectomy 6/8  6/8 plan for CCK today.  Patient is n.p.o.  Denies abdominal pain, nausea or vomiting this AM   Assessment & Plan:   Principal Problem:   Choledocholithiasis with acute cholecystitis Active Problems:   Pure hypercholesterolemia   ANXIETY DEPRESSION   Essential hypertension   Chronic back pain   History of CVA (cerebrovascular accident)  Choledocholithiasis with acute cholecystitis Elevated LFTs with hyperbilirubinemia Presented with 3  months of intermittent abdominal pain Associated with nausea and vomiting, worse after eating Right upper quadrant ultrasound with CBD 19 mm and obstructive stones and gallbladder sludge 6/8-continue on Zosyn GI following, status post ERCP on 6/6 LFTs improving, expect improvement over the next few days Continue to monitor.  No evidence of pancreatitis General surgery following plan for cholecystectomy today Patient has remained stable and did not develop signs of cholangitis    Hypokalemia Mild, asymptomatic K3.4 today We will replace with IV potassium   History of CVA Hyperlipidemia ASA and statin on hold for now  Resume when clinically more stable and LFTs improving     GERD Continue PPI  Chronic back pain Home tramadol to be continued  History of breast cancer Home anastrozole on hold for now   DVT prophylaxis: SCDs Code Status: Full Family Communication: none at bedside Disposition Plan: Status is: Inpatient  Remains inpatient appropriate because:Inpatient level of care appropriate due to severity of illness   Dispo: The patient is from: Home              Anticipated d/c is to: Home              Patient currently is not medically stable to d/c.   Difficult to place patient No    Level of care: Med-Surg  Consultants:   GI, gsx  Procedures:   None  Antimicrobials:   Zosyn   Subjective: Has no abd pain/nv/ this am  Objective: Vitals:   01/30/21 2008 01/30/21 2317 01/31/21 0409 01/31/21 0745  BP: (!) 149/78 136/80 130/67 Marland Kitchen)  154/79  Pulse: (!) 59 66 64 64  Resp: 16 16 16 16   Temp: 98 F (36.7 C) 97.7 F (36.5 C) 98 F (36.7 C) 98.1 F (36.7 C)  TempSrc: Oral Oral Oral Oral  SpO2: 99% 99% 100% 93%  Weight:      Height:        Intake/Output Summary (Last 24 hours) at 01/31/2021 0807 Last data filed at 01/31/2021 0302 Gross per 24 hour  Intake 1514.14 ml  Output 175 ml  Net 1339.14 ml   Filed Weights   01/27/21 1515 01/27/21 2000  01/30/21 0500  Weight: 76 kg 71.7 kg 79.6 kg    Examination: Nad, calm cta no w/r/r Regular s1/s2 no gallop Soft nt/nd +bs No edema Awake and alert, grossly intact    Data Reviewed: I have personally reviewed following labs and imaging studies  CBC: Recent Labs  Lab 01/27/21 1525 01/28/21 0524 01/29/21 0609 01/30/21 0531 01/31/21 0543  WBC 11.2* 9.1 6.2 6.8 7.6  NEUTROABS  --   --  3.2 3.4 4.2  HGB 12.9 11.9* 11.9* 11.1* 11.5*  HCT 37.2 34.4* 34.0* 32.2* 33.3*  MCV 95.9 96.9 96.3 98.5 96.5  PLT 264 240 231 202 654   Basic Metabolic Panel: Recent Labs  Lab 01/27/21 1525 01/27/21 1626 01/28/21 0524 01/29/21 0609 01/30/21 0531 01/31/21 0543  NA 141  --  138 140 136 141  K 3.3*  --  3.7 3.7 3.5 3.4*  CL 107  --  109 111 108 107  CO2 24  --  23 22 24 25   GLUCOSE 147*  --  120* 109* 111* 115*  BUN 12  --  11 7 6  <5*  CREATININE 0.53  --  0.42* 0.37* 0.56 0.55  CALCIUM 9.1  --  8.4* 8.3* 8.4* 8.5*  MG  --  1.8  --   --   --   --    GFR: Estimated Creatinine Clearance: 74.7 mL/min (by C-G formula based on SCr of 0.55 mg/dL). Liver Function Tests: Recent Labs  Lab 01/27/21 1525 01/28/21 0524 01/29/21 0609 01/30/21 0531 01/31/21 0543  AST 128* 156* 94* 60* 39  ALT 185* 206* 168* 130* 104*  ALKPHOS 435* 414* 337* 307* 265*  BILITOT 3.8* 5.2* 3.4* 2.2* 1.5*  PROT 6.6 5.7* 5.3* 5.1* 5.2*  ALBUMIN 3.7 3.0* 2.8* 2.7* 2.7*   Recent Labs  Lab 01/27/21 1525  LIPASE 39   No results for input(s): AMMONIA in the last 168 hours. Coagulation Profile: No results for input(s): INR, PROTIME in the last 168 hours. Cardiac Enzymes: No results for input(s): CKTOTAL, CKMB, CKMBINDEX, TROPONINI in the last 168 hours. BNP (last 3 results) No results for input(s): PROBNP in the last 8760 hours. HbA1C: No results for input(s): HGBA1C in the last 72 hours. CBG: No results for input(s): GLUCAP in the last 168 hours. Lipid Profile: No results for input(s): CHOL, HDL,  LDLCALC, TRIG, CHOLHDL, LDLDIRECT in the last 72 hours. Thyroid Function Tests: No results for input(s): TSH, T4TOTAL, FREET4, T3FREE, THYROIDAB in the last 72 hours. Anemia Panel: No results for input(s): VITAMINB12, FOLATE, FERRITIN, TIBC, IRON, RETICCTPCT in the last 72 hours. Sepsis Labs: No results for input(s): PROCALCITON, LATICACIDVEN in the last 168 hours.  Recent Results (from the past 240 hour(s))  Resp Panel by RT-PCR (Flu A&B, Covid) Nasopharyngeal Swab     Status: None   Collection Time: 01/27/21 10:54 PM   Specimen: Nasopharyngeal Swab; Nasopharyngeal(NP) swabs in vial transport medium  Result  Value Ref Range Status   SARS Coronavirus 2 by RT PCR NEGATIVE NEGATIVE Final    Comment: (NOTE) SARS-CoV-2 target nucleic acids are NOT DETECTED.  The SARS-CoV-2 RNA is generally detectable in upper respiratory specimens during the acute phase of infection. The lowest concentration of SARS-CoV-2 viral copies this assay can detect is 138 copies/mL. A negative result does not preclude SARS-Cov-2 infection and should not be used as the sole basis for treatment or other patient management decisions. A negative result may occur with  improper specimen collection/handling, submission of specimen other than nasopharyngeal swab, presence of viral mutation(s) within the areas targeted by this assay, and inadequate number of viral copies(<138 copies/mL). A negative result must be combined with clinical observations, patient history, and epidemiological information. The expected result is Negative.  Fact Sheet for Patients:  EntrepreneurPulse.com.au  Fact Sheet for Healthcare Providers:  IncredibleEmployment.be  This test is no t yet approved or cleared by the [place] FDA and  has been authorized for detection and/or diagnosis of SARS-CoV-2 by FDA under an Emergency Use Authorization (EUA). This EUA will remain  in effect (meaning this test  can be used) for the duration of the COVID-19 declaration under Section 564(b)(1) of the Act, 21 U.S.C.section 360bbb-3(b)(1), unless the authorization is terminated  or revoked sooner.       Influenza A by PCR NEGATIVE NEGATIVE Final   Influenza B by PCR NEGATIVE NEGATIVE Final    Comment: (NOTE) The Xpert Xpress SARS-CoV-2/FLU/RSV plus assay is intended as an aid in the diagnosis of influenza from Nasopharyngeal swab specimens and should not be used as a sole basis for treatment. Nasal washings and aspirates are unacceptable for Xpert Xpress SARS-CoV-2/FLU/RSV testing.  Fact Sheet for Patients: EntrepreneurPulse.com.au  Fact Sheet for Healthcare Providers: IncredibleEmployment.be  This test is not yet approved or cleared by the [place] FDA and has been authorized for detection and/or diagnosis of SARS-CoV-2 by FDA under an Emergency Use Authorization (EUA). This EUA will remain in effect (meaning this test can be used) for the duration of the COVID-19 declaration under Section 564(b)(1) of the Act, 21 U.S.C. section 360bbb-3(b)(1), unless the authorization is terminated or revoked.  Performed at [facility], [address] [place], [place] [address]          Radiology Studies: DG C-Arm 1-60 Min-No Report  Result Date: 01/29/2021 Fluoroscopy was utilized by the requesting physician.  No radiographic interpretation.        Scheduled Meds: . nicotine  21 mg Transdermal Daily  . pantoprazole  40 mg Oral Daily  . sodium chloride flush  3 mL Intravenous Q12H   Continuous Infusions: . sodium chloride 75 mL/hr at 01/31/21 0406  . piperacillin-tazobactam (ZOSYN)  IV 3.375 g (01/31/21 0510)     LOS: 4 days    Time spent: 35 minutes with more than 50% on [place], MD [facility] Pager [phone]  If 7PM-7AM, please contact night-coverage 01/31/2021, 8:07 AM

## 2021-01-31 NOTE — Anesthesia Preprocedure Evaluation (Signed)
Anesthesia Evaluation  Patient identified by MRN, date of birth, ID band Patient awake    Reviewed: Allergy & Precautions, NPO status , Patient's Chart, lab work & pertinent test results  History of Anesthesia Complications Negative for: history of anesthetic complications  Airway Mallampati: III  TM Distance: <3 FB Neck ROM: limited    Dental  (+) Missing, Edentulous Upper, Partial Lower   Pulmonary neg shortness of breath, COPD, Current Smoker and Patient abstained from smoking.,    Pulmonary exam normal breath sounds clear to auscultation       Cardiovascular Exercise Tolerance: Good hypertension, (-) angina(-) CAD Normal cardiovascular exam Rhythm:Regular Rate:Normal     Neuro/Psych  Headaches, PSYCHIATRIC DISORDERS Anxiety Depression  Neuromuscular disease    GI/Hepatic Neg liver ROS, PUD, GERD  Medicated and Controlled,  Endo/Other  negative endocrine ROS  Renal/GU negative Renal ROS  negative genitourinary   Musculoskeletal  (+) Arthritis ,   Abdominal   Peds  Hematology negative hematology ROS (+)   Anesthesia Other Findings Patient is NPO appropriate and reports no nausea or vomiting today.  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 2014: BREAST LUMPECTOMY; Bilateral     Comment:  bilateral breast ca No date: BTL No date: COLONOSCOPY 2008: epidural steroid injection 03/2016: LUMBAR FUSION     Comment:  L4-5 11/23/2012: PARTIAL MASTECTOMY WITH NEEDLE LOCALIZATION AND AXILLARY  SENTINEL LYMPH NODE BX; Bilateral     Comment:  Procedure: PARTIAL MASTECTOMY WITH NEEDLE LOCALIZATION               AND AXILLARY SENTINEL LYMPH NODE BX;  Surgeon: Adin Hector, MD;  Location: [place];                Service: General;  Laterality: Bilateral; 08/25/2013: PORT-A-CATH REMOVAL; Right     Comment:  Procedure: REMOVAL PORT-A-CATH;  Surgeon: Adin Hector, MD;  Location: [place];                Service: General;  Laterality: Right; 11/23/2012: PORTACATH PLACEMENT; Right     Comment:  Procedure: INSERTION PORT-A-CATH;  Surgeon: Edsel Petrin  Dalbert Batman, MD;  Location: [place];                Service: General;  Laterality: Right; No date: UPPER GI ENDOSCOPY  BMI    Body Mass Index: 28.01 kg/m      Reproductive/Obstetrics negative OB ROS                             Anesthesia Physical  Anesthesia Plan  ASA: III  Anesthesia Plan: General   Post-op Pain Management:    Induction: Intravenous  PONV Risk Score and Plan: 3 and Ondansetron, Dexamethasone and Midazolam  Airway Management Planned: Oral ETT  Additional Equipment: None  Intra-op Plan:   Post-operative Plan: Extubation in OR  Informed Consent: I have reviewed the patients  History and Physical, chart, labs and discussed the procedure including the risks, benefits and alternatives for the proposed anesthesia with the patient or authorized representative who has indicated his/her understanding and acceptance.     Dental Advisory Given  Plan Discussed with: Anesthesiologist, CRNA and Surgeon  Anesthesia Plan Comments: (Patient consented for risks of anesthesia including but not limited to:  - adverse reactions to medications - risk of airway placement if required - damage to eyes, teeth, lips or other oral mucosa - nerve damage due to positioning  - sore throat or hoarseness - Damage to heart, brain, nerves, lungs, other parts of body or loss of life  Patient voiced understanding.)        Anesthesia Quick Evaluation

## 2021-01-31 NOTE — Op Note (Signed)
Operative note  Pre-operative Diagnosis: Cholecystitis with choledocholithiasis  Post-operative Diagnosis: Same  Procedure: Robot assisted laparoscopic cholecystectomy  Surgeon: Fredirick Maudlin, MD  Anesthesia: GETA  Findings: Normal biliary anatomy; extensive inflammation and edema surrounding the gallbladder consistent with acute cholecystitis  Estimated Blood Loss: Less than 5 cc       Specimens: Gallbladder           Complications: none immediately apparent  Procedure In Detail: The patient was seen again in the holding room. The benefits, complications, treatment options, and expected outcomes were discussed with the patient. The risks of bleeding, infection, recurrence of symptoms, failure to resolve symptoms, bile duct damage, bile duct leak, retained common bile duct stone, bowel injury, any of which could require further surgery and/or ERCP, stent, or papillotomy were reviewed with the patient. The likelihood of improving the patient's symptoms with return to their baseline status is good.  The patient and/or family concurred with the proposed plan, giving informed consent.  The patient was taken to operating room, identified and the procedure verified as laparoscopic cholecystectomy.  A time out was held and the above information confirmed.  Prior to the induction of general anesthesia, antibiotic prophylaxis was not administered because the patient was receiving scheduled antibiotics. VTE prophylaxis was in place. General endotracheal anesthesia was then administered and tolerated well. After the induction, the abdomen was prepped with Chloraprep and draped in the sterile fashion. The patient was positioned in the supine position.  Optiview technique was used to enter the abdomen in the left upper quadrant at Palmer's point via a standard 5 mm laparoscopic trocar.  Pneumoperitoneum was then created with CO2 and tolerated well without any adverse changes in the patient's vital  signs.  A 12 mm robotic trocar along with 2 8-mm robotic trocars were placed in the lower abdomen under direct vision.  The 5 mm port was exchanged for an 8 mm robotic trocar all skin incisions were infiltrated with a local anesthetic agent before making the incision and placing the trocars.   The patient was positioned in 15 degrees of reverse Trendelenburg and tilted 10 degrees to the left.  The robot was brought to the surgical field and docked in the standard fashion.  We made sure all the instrumentation was kept in direct view at all times and that there were no collision between the arms.  I scrubbed out and went to the console.  The gallbladder was identified, the fundus grasped and retracted cephalad. Adhesions were lysed bluntly. The infundibulum was grasped and retracted laterally, exposing the peritoneum overlying the triangle of Calot. This was then divided and exposed in a blunt fashion. An extended critical view of the cystic duct and cystic artery was obtained.  The cystic duct was clearly identified and bluntly dissected away from the surrounding tissues, as was the cystic artery.  Using ICG cholangiography we visualized the cystic duct and confirmed that there was no aberrant biliary ductal anatomy nor any evidence of bile duct injury.  Both cystic duct and cystic artery were clipped and divided. The gallbladder was taken from the gallbladder fossa in a retrograde fashion with the electrocautery. Hemostasis was achieved with the electrocautery with additional Vistaseal due to some liver bleeding. Inspection of the right upper quadrant was performed. No bleeding, bile duct injury or leak, or bowel injury was noted.  The gallbladder was then placed in an Endopouch bag. The robotic instruments were removed and robotic arms were undocked in the standard fashion.  I scrubbed back in.  The gallbladder was removed via the 12 mm trocar site.  Using the PMI and a Carter-Thomason cone, the 12 mm  port site was then closed with interrupted 0 Vicryl sutures.  The remaining 8 mm ports were removed and pneumoperitoneum was released.  Each port site was closed with deep dermal 3-0 Vicryl.  4-0 subcuticular Monocryl was used to close the skin. Dermabond was applied, followed by Steri-Strips.  The patient was then awakened, extubated, and taken to the postanesthesia recovery unit in stable condition.   Sponge, lap, and needle counts were reported to be correct number at closure and at the conclusion of the case.          Fredirick Maudlin, MD FACS

## 2021-02-01 LAB — COMPREHENSIVE METABOLIC PANEL
ALT: 101 U/L — ABNORMAL HIGH (ref 0–44)
AST: 53 U/L — ABNORMAL HIGH (ref 15–41)
Albumin: 3 g/dL — ABNORMAL LOW (ref 3.5–5.0)
Alkaline Phosphatase: 268 U/L — ABNORMAL HIGH (ref 38–126)
Anion gap: 8 (ref 5–15)
BUN: 6 mg/dL (ref 6–20)
CO2: 25 mmol/L (ref 22–32)
Calcium: 8.5 mg/dL — ABNORMAL LOW (ref 8.9–10.3)
Chloride: 103 mmol/L (ref 98–111)
Creatinine, Ser: 0.52 mg/dL (ref 0.44–1.00)
GFR, Estimated: >60 mL/min (ref >60)
Glucose, Bld: 146 mg/dL — ABNORMAL HIGH (ref 70–99)
Potassium: 3.6 mmol/L (ref 3.5–5.1)
Sodium: 136 mmol/L (ref 135–145)
Total Bilirubin: 1.4 mg/dL — ABNORMAL HIGH (ref 0.3–1.2)
Total Protein: 5.6 g/dL — ABNORMAL LOW (ref 6.5–8.1)

## 2021-02-01 LAB — CBC WITH DIFFERENTIAL/PLATELET
Abs Immature Granulocytes: 0.09 10*3/uL — ABNORMAL HIGH (ref 0.00–0.07)
Basophils Absolute: 0 10*3/uL (ref 0.0–0.1)
Basophils Relative: 0 %
Eosinophils Absolute: 0 10*3/uL (ref 0.0–0.5)
Eosinophils Relative: 0 %
HCT: 35.3 % — ABNORMAL LOW (ref 36.0–46.0)
Hemoglobin: 12.2 g/dL (ref 12.0–15.0)
Immature Granulocytes: 1 %
Lymphocytes Relative: 13 %
Lymphs Abs: 1.7 10*3/uL (ref 0.7–4.0)
MCH: 33.7 pg (ref 26.0–34.0)
MCHC: 34.6 g/dL (ref 30.0–36.0)
MCV: 97.5 fL (ref 80.0–100.0)
Monocytes Absolute: 0.7 10*3/uL (ref 0.1–1.0)
Monocytes Relative: 5 %
Neutro Abs: 10.7 10*3/uL — ABNORMAL HIGH (ref 1.7–7.7)
Neutrophils Relative %: 81 %
Platelets: 250 10*3/uL (ref 150–400)
RBC: 3.62 MIL/uL — ABNORMAL LOW (ref 3.87–5.11)
RDW: 13.8 % (ref 11.5–15.5)
WBC: 13.2 10*3/uL — ABNORMAL HIGH (ref 4.0–10.5)
nRBC: 0 % (ref 0.0–0.2)

## 2021-02-01 MED ORDER — AMOXICILLIN-POT CLAVULANATE 875-125 MG PO TABS: 1.0000 | ORAL_TABLET | Freq: Two times a day (BID) | ORAL | 0 refills | Status: AC

## 2021-02-01 MED ORDER — OXYCODONE HCL 5 MG PO TABS: 5.0000 mg | ORAL_TABLET | ORAL | 0 refills | Status: DC | PRN

## 2021-02-01 NOTE — Progress Notes (Signed)
PROGRESS NOTE    Amy Leonard  WUJ:[id number] DOB: [birth year] DOA: 01/27/2021 PCP: Abner Greenspan, MD  Brief Narrative:  60 y.o. female with medical history significant of anxiety, depression, back pain, breast cancer, hypertension, CVA, lymphedema, hyperlipidemia, GERD, migraine, uses dentures who presents with ongoing abdominal pain.   Patient states that she has intermittent abdominal pain for about the past 3 months.  This pain as above comes and goes and it will radiate to her right upper quadrant from her epigastrium.  Pain described as a crampy and sharp pain with some associated nausea.  She states the pain is worse with eating.  She states that it would tend to feel better after vomiting.  The pain had been occurring about once a week until 2 weeks ago and it has been 2-3 times a week since then.  She also left work early twice this week.  She told her daughter who is a nurse about the pain earlier today who recommended she come to the ED for further evaluation.  Patient had LFT elevation.  follow-up right upper quadrant ultrasound shows markedly dilated common bile duct and 53mm concerning for CBD obstruction with stones in the bile duct and gallbladder sludge.  GI and general surgery were contacted from the emergency department.   Patient underwent successful ERCP with stent placement on 6/6.  Return to medical floor with recommendations for clear liquid diet today.  General surgery on board with tentative plan for cholecystectomy 6/8  6/8 plan for CCK today.  Patient is n.p.o.  Denies abdominal pain, nausea or vomiting this AM  6/9- tolerated full liquid diet this am. Abd soreness, no pain. No nv/. +flatus, no bm. ambulating  Assessment & Plan:   Principal Problem:   Choledocholithiasis with acute cholecystitis Active Problems:   Pure hypercholesterolemia   ANXIETY DEPRESSION   Essential hypertension   Chronic back pain   History of CVA (cerebrovascular  accident)  Choledocholithiasis with acute cholecystitis Elevated LFTs with hyperbilirubinemia Presented with 3 months of intermittent abdominal pain Associated with nausea and vomiting, worse after eating Right upper quadrant ultrasound with CBD 19 mm and obstructive stones and gallbladder sludge 6/9- s/p laparoscopic cholecystectomy on 6/9  General surgery following, continue Zosyn  Monitor hyperbilirubinemia  Monitor LFTs  Will need to follow-up with surgery in 2 weeks  Augmentin on discharge Monitor CMP today to make sure LFT trending down Wbc up today, will continue to monitor and iv abx    Hypokalemia Replaced and stable  History of CVA Hyperlipidemia ASA and statin on hold for now  Resume statins when LFTs are improved  Start asa in am per surgical PA via caht    GERD Continue PPI  Chronic back pain Home tramadol to be continued  History of breast cancer Home anastrozole on hold for now   DVT prophylaxis: SCDs Code Status: Full Family Communication: none at bedside Disposition Plan: Status is: Inpatient  Remains inpatient appropriate because:Inpatient level of care appropriate due to severity of illness   Dispo: The patient is from: Home              Anticipated d/c is to: Home              Patient currently is not medically stable to d/c.   Difficult to place patient No    Level of care: Med-Surg  Consultants:  GI, gsx  Procedures:  None  Antimicrobials:  Zosyn   Subjective: Tolerated full liquid. No BM. +flatus.  Objective: Vitals:   01/31/21 2334 02/01/21 0415 02/01/21 0500 02/01/21 0745  BP: 126/65 130/78  118/74  Pulse: 77 74  79  Resp: 16 16  16   Temp: 97.8 F (36.6 C) 97.7 F (36.5 C)  97.7 F (36.5 C)  TempSrc: Oral Oral  Oral  SpO2: 94% 96%  94%  Weight:   75.1 kg   Height:        Intake/Output Summary (Last 24 hours) at 02/01/2021 0826 Last data filed at 02/01/2021 0327 Gross per 24 hour  Intake 3269.53 ml  Output  505 ml  Net 2764.53 ml   Filed Weights   01/30/21 0500 01/31/21 1114 02/01/21 0500  Weight: 79.6 kg 79.6 kg 75.1 kg    Examination: NAD, calm CTA no wheeze rales rhonchi's Regular S1-S2 no gallops Soft, positive BS, distended nontender No edema Awake alert and oriented, grossly intact   Data Reviewed: I have personally reviewed following labs and imaging studies  CBC: Recent Labs  Lab 01/28/21 0524 01/29/21 0609 01/30/21 0531 01/31/21 0543 02/01/21 0552  WBC 9.1 6.2 6.8 7.6 13.2*  NEUTROABS  --  3.2 3.4 4.2 10.7*  HGB 11.9* 11.9* 11.1* 11.5* 12.2  HCT 34.4* 34.0* 32.2* 33.3* 35.3*  MCV 96.9 96.3 98.5 96.5 97.5  PLT 240 231 202 235 417   Basic Metabolic Panel: Recent Labs  Lab 01/27/21 1626 01/28/21 0524 01/29/21 0609 01/30/21 0531 01/31/21 0543 02/01/21 0552  NA  --  138 140 136 141 136  K  --  3.7 3.7 3.5 3.4* 3.6  CL  --  109 111 108 107 103  CO2  --  23 22 24 25 25   GLUCOSE  --  120* 109* 111* 115* 146*  BUN  --  11 7 6  <5* 6  CREATININE  --  0.42* 0.37* 0.56 0.55 0.52  CALCIUM  --  8.4* 8.3* 8.4* 8.5* 8.5*  MG 1.8  --   --   --   --   --    GFR: Estimated Creatinine Clearance: 72.6 mL/min (by C-G formula based on SCr of 0.52 mg/dL). Liver Function Tests: Recent Labs  Lab 01/28/21 0524 01/29/21 0609 01/30/21 0531 01/31/21 0543 02/01/21 0552  AST 156* 94* 60* 39 53*  ALT 206* 168* 130* 104* 101*  ALKPHOS 414* 337* 307* 265* 268*  BILITOT 5.2* 3.4* 2.2* 1.5* 1.4*  PROT 5.7* 5.3* 5.1* 5.2* 5.6*  ALBUMIN 3.0* 2.8* 2.7* 2.7* 3.0*   Recent Labs  Lab 01/27/21 1525  LIPASE 39   No results for input(s): AMMONIA in the last 168 hours. Coagulation Profile: No results for input(s): INR, PROTIME in the last 168 hours. Cardiac Enzymes: No results for input(s): CKTOTAL, CKMB, CKMBINDEX, TROPONINI in the last 168 hours. BNP (last 3 results) No results for input(s): PROBNP in the last 8760 hours. HbA1C: No results for input(s): HGBA1C in the last  72 hours. CBG: No results for input(s): GLUCAP in the last 168 hours. Lipid Profile: No results for input(s): CHOL, HDL, LDLCALC, TRIG, CHOLHDL, LDLDIRECT in the last 72 hours. Thyroid Function Tests: No results for input(s): TSH, T4TOTAL, FREET4, T3FREE, THYROIDAB in the last 72 hours. Anemia Panel: No results for input(s): VITAMINB12, FOLATE, FERRITIN, TIBC, IRON, RETICCTPCT in the last 72 hours. Sepsis Labs: No results for input(s): PROCALCITON, LATICACIDVEN in the last 168 hours.  Recent Results (from the past 240 hour(s))  Resp Panel by RT-PCR (Flu A&B, Covid) Nasopharyngeal Swab     Status: None   Collection Time:  01/27/21 10:54 PM   Specimen: Nasopharyngeal Swab; Nasopharyngeal(NP) swabs in vial transport medium  Result Value Ref Range Status   SARS Coronavirus 2 by RT PCR NEGATIVE NEGATIVE Final    Comment: (NOTE) SARS-CoV-2 target nucleic acids are NOT DETECTED.  The SARS-CoV-2 RNA is generally detectable in upper respiratory specimens during the acute phase of infection. The lowest concentration of SARS-CoV-2 viral copies this assay can detect is 138 copies/mL. A negative result does not preclude SARS-Cov-2 infection and should not be used as the sole basis for treatment or other patient management decisions. A negative result may occur with  improper specimen collection/handling, submission of specimen other than nasopharyngeal swab, presence of viral mutation(s) within the areas targeted by this assay, and inadequate number of viral copies(<138 copies/mL). A negative result must be combined with clinical observations, patient history, and epidemiological information. The expected result is Negative.  Fact Sheet for Patients:  EntrepreneurPulse.com.au  Fact Sheet for Healthcare Providers:  IncredibleEmployment.be  This test is no t yet approved or cleared by the [place] FDA and  has been authorized for detection and/or  diagnosis of SARS-CoV-2 by FDA under an Emergency Use Authorization (EUA). This EUA will remain  in effect (meaning this test can be used) for the duration of the COVID-19 declaration under Section 564(b)(1) of the Act, 21 U.S.C.section 360bbb-3(b)(1), unless the authorization is terminated  or revoked sooner.       Influenza A by PCR NEGATIVE NEGATIVE Final   Influenza B by PCR NEGATIVE NEGATIVE Final    Comment: (NOTE) The Xpert Xpress SARS-CoV-2/FLU/RSV plus assay is intended as an aid in the diagnosis of influenza from Nasopharyngeal swab specimens and should not be used as a sole basis for treatment. Nasal washings and aspirates are unacceptable for Xpert Xpress SARS-CoV-2/FLU/RSV testing.  Fact Sheet for Patients: EntrepreneurPulse.com.au  Fact Sheet for Healthcare Providers: IncredibleEmployment.be  This test is not yet approved or cleared by the [place] FDA and has been authorized for detection and/or diagnosis of SARS-CoV-2 by FDA under an Emergency Use Authorization (EUA). This EUA will remain in effect (meaning this test can be used) for the duration of the COVID-19 declaration under Section 564(b)(1) of the Act, 21 U.S.C. section 360bbb-3(b)(1), unless the authorization is terminated or revoked.  Performed at [facility], [address] [place], [place] [address]          Radiology Studies: No results found.       Scheduled Meds:  nicotine  21 mg Transdermal Daily   pantoprazole  40 mg Oral Daily   sodium chloride flush  3 mL Intravenous Q12H   Continuous Infusions:  sodium chloride 75 mL/hr at 02/01/21 0757   piperacillin-tazobactam (ZOSYN)  IV 3.375 g (02/01/21 0523)     LOS: 5 days    Time spent: 35 minutes with more than 50% on COC    Nolberto Hanlon, MD [facility] Pager [phone]  If 7PM-7AM, please contact night-coverage 02/01/2021, 8:26 AM

## 2021-02-01 NOTE — Progress Notes (Signed)
Amy Antigua, MD [address], [place], [place], [place], [address] [address], [place], [place], [place], [address] Phone: [phone]  Fax: [phone]   Subjective: Patient resting in bed comfortably.  Denies any abdominal pain.  No fever or chills.  Tolerating oral diet without difficulty.  Underwent cholecystectomy yesterday   Objective: Exam: Vital signs in last 24 hours: Vitals:   02/01/21 0415 02/01/21 0500 02/01/21 0745 02/01/21 1104  BP: 130/78  118/74 139/75  Pulse: 74  79 69  Resp: 16  16 16   Temp: 97.7 F (36.5 C)  97.7 F (36.5 C) 97.9 F (36.6 C)  TempSrc: Oral  Oral Oral  SpO2: 96%  94% 97%  Weight:  75.1 kg    Height:       Weight change:   Intake/Output Summary (Last 24 hours) at 02/01/2021 1142 Last data filed at 02/01/2021 1018 Gross per 24 hour  Intake 3749.53 ml  Output 505 ml  Net 3244.53 ml    General: No acute distress, AAO x3 Abd: Soft, NT/ND, No HSM Skin: Warm, no rashes Neck: Supple, Trachea midline   Lab Results: Lab Results  Component Value Date   WBC 13.2 (H) 02/01/2021   HGB 12.2 02/01/2021   HCT 35.3 (L) 02/01/2021   MCV 97.5 02/01/2021   PLT 250 02/01/2021   Micro Results: Recent Results (from the past 240 hour(s))  Resp Panel by RT-PCR (Flu A&B, Covid) Nasopharyngeal Swab     Status: None   Collection Time: 01/27/21 10:54 PM   Specimen: Nasopharyngeal Swab; Nasopharyngeal(NP) swabs in vial transport medium  Result Value Ref Range Status   SARS Coronavirus 2 by RT PCR NEGATIVE NEGATIVE Final    Comment: (NOTE) SARS-CoV-2 target nucleic acids are NOT DETECTED.  The SARS-CoV-2 RNA is generally detectable in upper respiratory specimens during the acute phase of infection. The lowest concentration of SARS-CoV-2 viral copies this assay can detect is 138 copies/mL. A negative result does not preclude SARS-Cov-2 infection and should not be used as the sole basis for treatment or other patient management  decisions. A negative result may occur with  improper specimen collection/handling, submission of specimen other than nasopharyngeal swab, presence of viral mutation(s) within the areas targeted by this assay, and inadequate number of viral copies(<138 copies/mL). A negative result must be combined with clinical observations, patient history, and epidemiological information. The expected result is Negative.  Fact Sheet for Patients:  EntrepreneurPulse.com.au  Fact Sheet for Healthcare Providers:  IncredibleEmployment.be  This test is no t yet approved or cleared by the [place] FDA and  has been authorized for detection and/or diagnosis of SARS-CoV-2 by FDA under an Emergency Use Authorization (EUA). This EUA will remain  in effect (meaning this test can be used) for the duration of the COVID-19 declaration under Section 564(b)(1) of the Act, 21 U.S.C.section 360bbb-3(b)(1), unless the authorization is terminated  or revoked sooner.       Influenza A by PCR NEGATIVE NEGATIVE Final   Influenza B by PCR NEGATIVE NEGATIVE Final    Comment: (NOTE) The Xpert Xpress SARS-CoV-2/FLU/RSV plus assay is intended as an aid in the diagnosis of influenza from Nasopharyngeal swab specimens and should not be used as a sole basis for treatment. Nasal washings and aspirates are unacceptable for Xpert Xpress SARS-CoV-2/FLU/RSV testing.  Fact Sheet for Patients: EntrepreneurPulse.com.au  Fact Sheet for Healthcare Providers: IncredibleEmployment.be  This test is not yet approved or cleared by the [place] and has been authorized for  detection and/or diagnosis of SARS-CoV-2 by FDA under an Emergency Use Authorization (EUA). This EUA will remain in effect (meaning this test can be used) for the duration of the COVID-19 declaration under Section 564(b)(1) of the Act, 21 U.S.C. section 360bbb-3(b)(1), unless the  authorization is terminated or revoked.  Performed at [facility], [address], [place], [place] [address]    Studies/Results: No results found. Medications:  Scheduled Meds:  nicotine  21 mg Transdermal Daily   pantoprazole  40 mg Oral Daily   sodium chloride flush  3 mL Intravenous Q12H   Continuous Infusions:  sodium chloride 75 mL/hr at 02/01/21 0757   piperacillin-tazobactam (ZOSYN)  IV 3.375 g (02/01/21 0523)   PRN Meds:.acetaminophen **OR** acetaminophen, menthol-cetylpyridinium, traMADol   Assessment: Principal Problem:   Choledocholithiasis with acute cholecystitis Active Problems:   Pure hypercholesterolemia   ANXIETY DEPRESSION   Essential hypertension   Chronic back pain   History of CVA (cerebrovascular accident)    Plan: Patient doing well post surgery  Does have some persistent elevation in liver enzymes, although overall improved compared to admission  Continue to avoid hepatotoxic drugs, repeat CMP tomorrow   LOS: 5 days   Amy Antigua, MD 02/01/2021, 11:42 AM

## 2021-02-01 NOTE — Discharge Instructions (Signed)
In addition to included general post-operative instructions,  Diet: Resume home diet. Recommend avoiding or limiting fatty/greasy foods over the next few days/week. If you do eat these, you may (or may not) notice diarrhea. This is expected while your body adjusts to not having a gallbladder, and it typically resolves with time.    Activity: No heavy lifting >20 pounds (children, pets, laundry, garbage) for 4 weeks, but light activity and walking are encouraged. Do not drive or drink alcohol if taking narcotic pain medications or having pain that might distract from driving.  Wound care: 2 days after surgery (06/10), you may shower/get incision wet with soapy water and pat dry (do not rub incisions), but no baths or submerging incision underwater until follow-up.   Medications: Resume all home medications. For mild to moderate pain: acetaminophen (Tylenol) or ibuprofen/naproxen (if no kidney disease). Combining Tylenol with alcohol can substantially increase your risk of causing liver disease. Narcotic pain medications, if prescribed, can be used for severe pain, though may cause nausea, constipation, and drowsiness. Do not combine Tylenol and Percocet (or similar) within a 6 hour period as Percocet (and similar) contain(s) Tylenol. If you do not need the narcotic pain medication, you do not need to fill the prescription.  Call office [phone] / [phone]) at any time if any questions, worsening pain, fevers/chills, bleeding, drainage from incision site, or other concerns.

## 2021-02-01 NOTE — Progress Notes (Addendum)
[facility] Day(s): 5.   Post op day(s): 1 Day Post-Op.   Interval History:  Patient seen and examined No acute events or new complaints overnight.  Patient reports she has abdominal soreness, mostly after moving but overall feels good No fever, chills, nausea, emesis Mild leukocytosis this morning to 13.2K; likely reactive from OR Renal function remains normal; sCr - 0.52; UO - 500 ccs Previously seen hypokalemia now resolved Hyperbilirubinemia continues to improve; down 1.4 She has been on liquid diet; tolerating well; wants to try solid food  Vital signs in last 24 hours: [min-max] current  Temp:  [97 F (36.1 C)-98.1 F (36.7 C)] 97.7 F (36.5 C) (06/09 0415) Pulse Rate:  [64-92] 74 (06/09 0415) Resp:  [13-20] 16 (06/09 0415) BP: (126-164)/(65-90) 130/78 (06/09 0415) SpO2:  [86 %-100 %] 96 % (06/09 0415) Weight:  [75.1 kg-79.6 kg] 75.1 kg (06/09 0500)     Height: 5' 2.99" (160 cm) Weight: 75.1 kg BMI (Calculated): 29.34   Intake/Output last 2 shifts:  06/08 0701 - 06/09 0700 In: 3269.5 [P.O.:900; I.V.:2122.7; IV Piggyback:246.9] Out: 505 [Urine:500; Blood:5]   Physical Exam:  Constitutional: alert, cooperative and no distress  Respiratory: breathing non-labored at rest  Cardiovascular: regular rate and sinus rhythm  Gastrointestinal: soft, incisional soreness, and non-distended, no rebound/guarding Integumentary: Laparoscopic incisions are CDI with steri-strips, no erythema or drainage   Labs:  CBC Latest Ref Rng & Units 02/01/2021 01/31/2021 01/30/2021  WBC 4.0 - 10.5 K/uL 13.2(H) 7.6 6.8  Hemoglobin 12.0 - 15.0 g/dL 12.2 11.5(L) 11.1(L)  Hematocrit 36.0 - 46.0 % 35.3(L) 33.3(L) 32.2(L)  Platelets 150 - 400 K/uL 250 235 202   CMP Latest Ref Rng & Units 02/01/2021 01/31/2021 01/30/2021  Glucose 70 - 99 mg/dL 146(H) 115(H) 111(H)  BUN 6 - 20 mg/dL 6 <5(L) 6  Creatinine 0.44 - 1.00 mg/dL 0.52 0.55 0.56  Sodium 135 - 145  mmol/L 136 141 136  Potassium 3.5 - 5.1 mmol/L 3.6 3.4(L) 3.5  Chloride 98 - 111 mmol/L 103 107 108  CO2 22 - 32 mmol/L 25 25 24   Calcium 8.9 - 10.3 mg/dL 8.5(L) 8.5(L) 8.4(L)  Total Protein 6.5 - 8.1 g/dL 5.6(L) 5.2(L) 5.1(L)  Total Bilirubin 0.3 - 1.2 mg/dL 1.4(H) 1.5(H) 2.2(H)  Alkaline Phos 38 - 126 U/L 268(H) 265(H) 307(H)  AST 15 - 41 U/L 53(H) 39 60(H)  ALT 0 - 44 U/L 101(H) 104(H) 130(H)     Imaging studies: No new pertinent imaging studies   Assessment/Plan:  60 y.o. female 1 Day Post-Op s/p laparoscopic cholecystectomy for choledocholithiasis and cholecystitis.   - Advance to low fat diet   - Wean/Discontinue IVF - Continue IV Abx (Zosyn)             - Monitor abdominal examination               - Pain control prn; antiemetics prn              - Monitor hyperbilirubinemia; improving              - Further management per primary service   - Discharge Planning; Okay for discharge from surgical perspective, I will update discharge instruction and prescriptions from surgical standpoint. I will also place 2 week follow up   All of the above findings and recommendations were discussed with the patient, patient's family (daughter at bedside), and the medical team, and all of patient's and family's questions were answered  to their expressed satisfaction.  -- Edison Simon, PA-C [facility] 02/01/2021, 7:28 AM [phone] M-F: 7am - 4pm  I saw and evaluated the patient.  I agree with the above documentation, exam, and plan, which I have edited where appropriate. Fredirick Maudlin  1:39 PM

## 2021-02-02 LAB — CBC WITH DIFFERENTIAL/PLATELET
Abs Immature Granulocytes: 0.04 10*3/uL (ref 0.00–0.07)
Basophils Absolute: 0 10*3/uL (ref 0.0–0.1)
Basophils Relative: 0 %
Eosinophils Absolute: 0 10*3/uL (ref 0.0–0.5)
Eosinophils Relative: 0 %
HCT: 34.1 % — ABNORMAL LOW (ref 36.0–46.0)
Hemoglobin: 11.6 g/dL — ABNORMAL LOW (ref 12.0–15.0)
Immature Granulocytes: 0 %
Lymphocytes Relative: 28 %
Lymphs Abs: 3.7 10*3/uL (ref 0.7–4.0)
MCH: 33.5 pg (ref 26.0–34.0)
MCHC: 34 g/dL (ref 30.0–36.0)
MCV: 98.6 fL (ref 80.0–100.0)
Monocytes Absolute: 1.3 10*3/uL — ABNORMAL HIGH (ref 0.1–1.0)
Monocytes Relative: 10 %
Neutro Abs: 8.1 10*3/uL — ABNORMAL HIGH (ref 1.7–7.7)
Neutrophils Relative %: 62 %
Platelets: 245 10*3/uL (ref 150–400)
RBC: 3.46 MIL/uL — ABNORMAL LOW (ref 3.87–5.11)
RDW: 14.3 % (ref 11.5–15.5)
WBC: 13.2 10*3/uL — ABNORMAL HIGH (ref 4.0–10.5)
nRBC: 0 % (ref 0.0–0.2)

## 2021-02-02 LAB — COMPREHENSIVE METABOLIC PANEL
ALT: 72 U/L — ABNORMAL HIGH (ref 0–44)
AST: 26 U/L (ref 15–41)
Albumin: 2.8 g/dL — ABNORMAL LOW (ref 3.5–5.0)
Alkaline Phosphatase: 203 U/L — ABNORMAL HIGH (ref 38–126)
Anion gap: 8 (ref 5–15)
BUN: 16 mg/dL (ref 6–20)
CO2: 27 mmol/L (ref 22–32)
Calcium: 8.4 mg/dL — ABNORMAL LOW (ref 8.9–10.3)
Chloride: 103 mmol/L (ref 98–111)
Creatinine, Ser: 0.64 mg/dL (ref 0.44–1.00)
GFR, Estimated: >60 mL/min (ref >60)
Glucose, Bld: 117 mg/dL — ABNORMAL HIGH (ref 70–99)
Potassium: 3.7 mmol/L (ref 3.5–5.1)
Sodium: 138 mmol/L (ref 135–145)
Total Bilirubin: 1.2 mg/dL (ref 0.3–1.2)
Total Protein: 5.5 g/dL — ABNORMAL LOW (ref 6.5–8.1)

## 2021-02-02 LAB — SURGICAL PATHOLOGY

## 2021-02-02 MED ORDER — NICOTINE 21 MG/24HR TD PT24: 21.0000 mg | MEDICATED_PATCH | Freq: Every day | TRANSDERMAL | 0 refills | Status: DC

## 2021-02-02 NOTE — Plan of Care (Signed)

## 2021-02-02 NOTE — Consult Note (Addendum)
Pharmacy Antibiotic Note  Amy Leonard is a 60 y.o. female admitted on 01/27/2021 with ongoing abdominal pain x 3 months.   POD 4 ERCP, POD 2 lap choly  Pharmacy has been consulted for Zosyn dosing for intra-abdominal infection.   Plan: Continue Zosyn 3.375g IV q8h (4 hour infusion). (Day 7)  Height: 5' 2.99" (160 cm) Weight: 75.1 kg (165 lb 9.1 oz) IBW/kg (Calculated) : 52.38  Temp (24hrs), Avg:98.3 F (36.8 C), Min:97.8 F (36.6 C), Max:98.8 F (37.1 C)  Recent Labs  Lab 01/29/21 0609 01/30/21 0531 01/31/21 0543 02/01/21 0552 02/02/21 0421  WBC 6.2 6.8 7.6 13.2* 13.2*  CREATININE 0.37* 0.56 0.55 0.52 0.64     Estimated Creatinine Clearance: 72.6 mL/min (by C-G formula based on SCr of 0.64 mg/dL).    Allergies  Allergen Reactions   Gabapentin Swelling   Lyrica [Pregabalin] Other (See Comments)    sedated   Voltaren [Diclofenac Sodium] Nausea And Vomiting   Bupropion Hcl Nausea Only and Other (See Comments)     GI, sleepy   Chlorhexidine Gluconate Itching and Rash   Hydrocod Polst-Cpm Polst Er Nausea And Vomiting    vomiting   Lisinopril Other (See Comments)    Leg cramps     Naproxen Sodium Other (See Comments)    does not tolerate    Antimicrobials this admission: 6/4 zosyn >>   Dose adjustments this admission: n/a  Microbiology results:  Thank you for allowing pharmacy to be a part of this patient's care.  Lu Duffel, PharmD, BCPS Clinical Pharmacist  02/02/2021 8:48 AM

## 2021-02-02 NOTE — Discharge Summary (Signed)
Amy Leonard MVE:[id number] DOB: [birth year] DOA: 01/27/2021  PCP: Abner Greenspan, MD  Admit date: 01/27/2021 Discharge date: 02/02/2021  Admitted From: Home Disposition: Home  Recommendations for Outpatient Follow-up:  Follow up with PCP in 1 week Please obtain BMP/CBC in one week General surgery in 2 weeks Primary GI in 2 weeks Dr. Allen Norris in 2 weeks  Home Health: No   Discharge Condition:Stable CODE STATUS:Full Diet recommendation: Heart Healthy, low-fat Brief/Interim Summary: Per HPI:60 y.o. female with medical history significant of anxiety, depression, back pain, breast cancer, hypertension, CVA, lymphedema, hyperlipidemia, GERD, migraine, uses dentures who presents with ongoing abdominal pain. Patient had LFT elevation.  follow-up right upper quadrant ultrasound shows markedly dilated common bile duct and 2mm concerning for CBD obstruction with stones in the bile duct and gallbladder sludge.  GI and general surgery were contacted . Patient underwent successful ERCP with stent placement on 6/6.  Patient then underwent cholecystectomy on 6/8.  Has tolerated her feeding.  And has been cleared to be discharged.  Her LFTs have improved.   Choledocholithiasis with acute cholecystitis Elevated LFTs with hyperbilirubinemia Presented with 3 months of intermittent abdominal pain Associated with nausea and vomiting, worse after eating Right upper quadrant ultrasound with CBD 19 mm and obstructive stones and gallbladder sludge s/p laparoscopic cholecystectomy on 6/8 S/p Successful ERCP with stent placement on 6/6 Discharged on Augmentin Will need to f/u Dr. Allen Norris about ERCP stent removal in 2 weeks. Check labs by pcp next week        Hypokalemia Replaced and stable   History of CVA Hyperlipidemia LFT improving, can resume statin soon Start asa in am per surgical PA via caht       GERD Continue PPI   Chronic back pain Home tramadol to be continued   History of breast  cancer Home anastrozole on hold for no       Hypokalemia Replaced and stable          GERD Continue PPI   Chronic back pain    History of breast cancer Follow-up Home regimen   Discharge Diagnoses:  Principal Problem:   Choledocholithiasis with acute cholecystitis Active Problems:   Pure hypercholesterolemia   ANXIETY DEPRESSION   Essential hypertension   Chronic back pain   History of CVA (cerebrovascular accident)    Discharge Instructions  Discharge Instructions     Call MD for:  temperature >100.4   Complete by: As directed    Diet - low sodium heart healthy   Complete by: As directed    Discharge instructions   Complete by: As directed    Follow up with pcp in one week for labs Avoid greasy food Follow up with Dr. Allen Norris , GI   Increase activity slowly   Complete by: As directed       Allergies as of 02/02/2021       Reactions   Gabapentin Swelling   Lyrica [pregabalin] Other (See Comments)   sedated   Voltaren [diclofenac Sodium] Nausea And Vomiting   Bupropion Hcl Nausea Only, Other (See Comments)    GI, sleepy   Chlorhexidine Gluconate Itching, Rash   Hydrocod Polst-cpm Polst Er Nausea And Vomiting   vomiting   Lisinopril Other (See Comments)   Leg cramps    Naproxen Sodium Other (See Comments)   does not tolerate        Medication List     STOP taking these medications    famotidine 20 MG tablet Commonly known as: PEPCID  traMADol 50 MG tablet Commonly known as: Ultram   trolamine salicylate 10 % cream Commonly known as: ASPERCREME   TYLENOL ARTHRITIS PAIN PO       TAKE these medications    amoxicillin-clavulanate 875-125 MG tablet Commonly known as: Augmentin Take 1 tablet by mouth 2 (two) times daily for 7 days.   anastrozole 1 MG tablet Commonly known as: ARIMIDEX TAKE 1 TABLET DAILY   aspirin 81 MG chewable tablet Chew 1 tablet (81 mg total) by mouth daily.   atorvastatin 10 MG tablet Commonly known  as: LIPITOR Take 1 tablet (10 mg total) by mouth daily.   b complex vitamins tablet Take 2 tablets by mouth daily.   CALCIUM + D PO Take 1 tablet by mouth daily.   calcium carbonate 500 MG chewable tablet Commonly known as: TUMS - dosed in mg elemental calcium Chew by mouth as directed.   nicotine 21 mg/24hr patch Commonly known as: NICODERM CQ - dosed in mg/24 hours Place 1 patch (21 mg total) onto the skin daily. Start taking on: February 03, 2021   omeprazole 40 MG capsule Commonly known as: PRILOSEC Take 1 capsule (40 mg total) by mouth daily.   oxyCODONE 5 MG immediate release tablet Commonly known as: Oxy IR/ROXICODONE Take 1 tablet (5 mg total) by mouth every 4 (four) hours as needed for severe pain or breakthrough pain.        Follow-up Information     Tylene Fantasia, PA-C. Schedule an appointment as soon as possible for a visit in 2 week(s).   Specialty: Physician Assistant Why: s/p lap cholecystectomy Contact information: [address] [place] [address] [phone]         Abner Greenspan, MD Follow up in 1 week(s).   Specialties: Family Medicine, Radiology Contact information: [place] [address] [phone]         Ladene Artist, MD Follow up in 2 week(s).   Specialty: Gastroenterology Contact information: [address] [address] [phone]         Lucilla Lame, MD Follow up in 2 week(s).   Specialty: Gastroenterology Contact information: [place]  [place] [address] [phone]                Allergies  Allergen Reactions   Gabapentin Swelling   Lyrica [Pregabalin] Other (See Comments)    sedated   Voltaren [Diclofenac Sodium] Nausea And Vomiting   Bupropion Hcl Nausea Only and Other (See Comments)     GI, sleepy   Chlorhexidine Gluconate Itching and Rash   Hydrocod Polst-Cpm Polst Er Nausea And Vomiting    vomiting   Lisinopril Other (See  Comments)    Leg cramps     Naproxen Sodium Other (See Comments)    does not tolerate    Consultations: GI, general surgery   Procedures/Studies: DG C-Arm 1-60 Min-No Report  Result Date: 01/29/2021 Fluoroscopy was utilized by the requesting physician.  No radiographic interpretation.   US ABDOMEN LIMITED RUQ (LIVER/GB)  Result Date: 01/27/2021 CLINICAL DATA:  RIGHT upper quadrant pain, elevated liver function test. Back pain today. EXAM: ULTRASOUND ABDOMEN LIMITED RIGHT UPPER QUADRANT COMPARISON:  None. FINDINGS: Gallbladder: Gallbladder wall is thickened and edematous, measuring up to 7 mm thickness. Multiple mobile stones within the gallbladder fundus region, largest measuring 6 mm. Common bile duct: Diameter: 19 mm. Multiple obstructing stones are seen within the markedly distended common bile duct, and probable adjacent sludge.  Liver: No focal lesion identified. Liver is diffusely echogenic indicating fatty infiltration. Portal vein is patent on color Doppler imaging with normal direction of blood flow towards the liver. Other: None. IMPRESSION: 1. Common bile duct is markedly distended, measuring 19 mm diameter, with multiple obstructing stones and sludge within the mid-to-distal CBD. 2. Gallbladder walls are thickened/edematous suggesting associated acute cholecystitis. 3. Cholelithiasis. 4. Fatty infiltration of the liver. Electronically Signed   By: Franki Cabot M.D.   On: 01/27/2021 17:24      Subjective: Feels better.  Mildly sore in the abdomen from surgical site otherwise doing well.  Passing flatus.  Ambulating and tolerated feeding  Discharge Exam: Vitals:   02/02/21 0430 02/02/21 0824  BP: 131/77 110/68  Pulse: 73 70  Resp: 16 16  Temp: 98.6 F (37 C) 97.8 F (36.6 C)  SpO2: 95% 100%   Vitals:   02/01/21 1547 02/01/21 2019 02/02/21 0430 02/02/21 0824  BP: (!) 105/57 121/68 131/77 110/68  Pulse: 94 88 73 70  Resp: 16 18 16 16   Temp: 98.6 F (37 C) 98.8 F (37.1  C) 98.6 F (37 C) 97.8 F (36.6 C)  TempSrc: Oral Oral Oral Oral  SpO2: 95% 97% 95% 100%  Weight:      Height:        General: Pt is alert, awake, not in acute distress Cardiovascular: RRR, S1/S2 +, no rubs, no gallops Respiratory: CTA bilaterally, no wheezing, no rhonchi Abdominal: Soft, NT, ND, bowel sounds + Extremities: no edema    The results of significant diagnostics from this hospitalization (including imaging, microbiology, ancillary and laboratory) are listed below for reference.     Microbiology: Recent Results (from the past 240 hour(s))  Resp Panel by RT-PCR (Flu A&B, Covid) Nasopharyngeal Swab     Status: None   Collection Time: 01/27/21 10:54 PM   Specimen: Nasopharyngeal Swab; Nasopharyngeal(NP) swabs in vial transport medium  Result Value Ref Range Status   SARS Coronavirus 2 by RT PCR NEGATIVE NEGATIVE Final    Comment: (NOTE) SARS-CoV-2 target nucleic acids are NOT DETECTED.  The SARS-CoV-2 RNA is generally detectable in upper respiratory specimens during the acute phase of infection. The lowest concentration of SARS-CoV-2 viral copies this assay can detect is 138 copies/mL. A negative result does not preclude SARS-Cov-2 infection and should not be used as the sole basis for treatment or other patient management decisions. A negative result may occur with  improper specimen collection/handling, submission of specimen other than nasopharyngeal swab, presence of viral mutation(s) within the areas targeted by this assay, and inadequate number of viral copies(<138 copies/mL). A negative result must be combined with clinical observations, patient history, and epidemiological information. The expected result is Negative.  Fact Sheet for Patients:  EntrepreneurPulse.com.au  Fact Sheet for Healthcare Providers:  IncredibleEmployment.be  This test is no t yet approved or cleared by the [place] FDA and  has been  authorized for detection and/or diagnosis of SARS-CoV-2 by FDA under an Emergency Use Authorization (EUA). This EUA will remain  in effect (meaning this test can be used) for the duration of the COVID-19 declaration under Section 564(b)(1) of the Act, 21 U.S.C.section 360bbb-3(b)(1), unless the authorization is terminated  or revoked sooner.       Influenza A by PCR NEGATIVE NEGATIVE Final   Influenza B by PCR NEGATIVE NEGATIVE Final    Comment: (NOTE) The Xpert Xpress SARS-CoV-2/FLU/RSV plus assay is intended as an aid in the diagnosis of influenza from Nasopharyngeal swab  specimens and should not be used as a sole basis for treatment. Nasal washings and aspirates are unacceptable for Xpert Xpress SARS-CoV-2/FLU/RSV testing.  Fact Sheet for Patients: EntrepreneurPulse.com.au  Fact Sheet for Healthcare Providers: IncredibleEmployment.be  This test is not yet approved or cleared by the [place] FDA and has been authorized for detection and/or diagnosis of SARS-CoV-2 by FDA under an Emergency Use Authorization (EUA). This EUA will remain in effect (meaning this test can be used) for the duration of the COVID-19 declaration under Section 564(b)(1) of the Act, 21 U.S.C. section 360bbb-3(b)(1), unless the authorization is terminated or revoked.  Performed at [facility], [address] [place], [place] [address]      Labs: BNP (last 3 results) No results for input(s): BNP in the last 8760 hours. Basic Metabolic Panel: Recent Labs  Lab 01/27/21 1626 01/28/21 0524 01/29/21 0609 01/30/21 0531 01/31/21 0543 02/01/21 0552 02/02/21 0421  NA  --    < > 140 136 141 136 138  K  --    < > 3.7 3.5 3.4* 3.6 3.7  CL  --    < > 111 108 107 103 103  CO2  --    < > 22 24 25 25 27   GLUCOSE  --    < > 109* 111* 115* 146* 117*  BUN  --    < > 7 6 <5* 6 16  CREATININE  --    < > 0.37* 0.56 0.55 0.52 0.64  CALCIUM  --    < > 8.3* 8.4*  8.5* 8.5* 8.4*  MG 1.8  --   --   --   --   --   --    < > = values in this interval not displayed.   Liver Function Tests: Recent Labs  Lab 01/29/21 0609 01/30/21 0531 01/31/21 0543 02/01/21 0552 02/02/21 0421  AST 94* 60* 39 53* 26  ALT 168* 130* 104* 101* 72*  ALKPHOS 337* 307* 265* 268* 203*  BILITOT 3.4* 2.2* 1.5* 1.4* 1.2  PROT 5.3* 5.1* 5.2* 5.6* 5.5*  ALBUMIN 2.8* 2.7* 2.7* 3.0* 2.8*   Recent Labs  Lab 01/27/21 1525  LIPASE 39   No results for input(s): AMMONIA in the last 168 hours. CBC: Recent Labs  Lab 01/29/21 0609 01/30/21 0531 01/31/21 0543 02/01/21 0552 02/02/21 0421  WBC 6.2 6.8 7.6 13.2* 13.2*  NEUTROABS 3.2 3.4 4.2 10.7* 8.1*  HGB 11.9* 11.1* 11.5* 12.2 11.6*  HCT 34.0* 32.2* 33.3* 35.3* 34.1*  MCV 96.3 98.5 96.5 97.5 98.6  PLT 231 202 235 250 245   Cardiac Enzymes: No results for input(s): CKTOTAL, CKMB, CKMBINDEX, TROPONINI in the last 168 hours. BNP: Invalid input(s): POCBNP CBG: No results for input(s): GLUCAP in the last 168 hours. D-Dimer No results for input(s): DDIMER in the last 72 hours. Hgb A1c No results for input(s): HGBA1C in the last 72 hours. Lipid Profile No results for input(s): CHOL, HDL, LDLCALC, TRIG, CHOLHDL, LDLDIRECT in the last 72 hours. Thyroid function studies No results for input(s): TSH, T4TOTAL, T3FREE, THYROIDAB in the last 72 hours.  Invalid input(s): FREET3 Anemia work up No results for input(s): VITAMINB12, FOLATE, FERRITIN, TIBC, IRON, RETICCTPCT in the last 72 hours. Urinalysis    Component Value Date/Time   COLORURINE AMBER (A) 01/27/2021 1526   APPEARANCEUR HAZY (A) 01/27/2021 1526   LABSPEC 1.020 01/27/2021 1526   PHURINE 5.0 01/27/2021 1526   GLUCOSEU NEGATIVE 01/27/2021 1526   HGBUR NEGATIVE 01/27/2021 1526  BILIRUBINUR SMALL (A) 01/27/2021 1526   BILIRUBINUR neg 02/02/2014 1803   KETONESUR 5 (A) 01/27/2021 1526   PROTEINUR NEGATIVE 01/27/2021 1526   UROBILINOGEN 0.2 02/02/2014 1803    UROBILINOGEN 0.2 01/05/2013 2251   NITRITE NEGATIVE 01/27/2021 1526   LEUKOCYTESUR MODERATE (A) 01/27/2021 1526   Sepsis Labs Invalid input(s): PROCALCITONIN,  WBC,  LACTICIDVEN Microbiology Recent Results (from the past 240 hour(s))  Resp Panel by RT-PCR (Flu A&B, Covid) Nasopharyngeal Swab     Status: None   Collection Time: 01/27/21 10:54 PM   Specimen: Nasopharyngeal Swab; Nasopharyngeal(NP) swabs in vial transport medium  Result Value Ref Range Status   SARS Coronavirus 2 by RT PCR NEGATIVE NEGATIVE Final    Comment: (NOTE) SARS-CoV-2 target nucleic acids are NOT DETECTED.  The SARS-CoV-2 RNA is generally detectable in upper respiratory specimens during the acute phase of infection. The lowest concentration of SARS-CoV-2 viral copies this assay can detect is 138 copies/mL. A negative result does not preclude SARS-Cov-2 infection and should not be used as the sole basis for treatment or other patient management decisions. A negative result may occur with  improper specimen collection/handling, submission of specimen other than nasopharyngeal swab, presence of viral mutation(s) within the areas targeted by this assay, and inadequate number of viral copies(<138 copies/mL). A negative result must be combined with clinical observations, patient history, and epidemiological information. The expected result is Negative.  Fact Sheet for Patients:  EntrepreneurPulse.com.au  Fact Sheet for Healthcare Providers:  IncredibleEmployment.be  This test is no t yet approved or cleared by the [place] FDA and  has been authorized for detection and/or diagnosis of SARS-CoV-2 by FDA under an Emergency Use Authorization (EUA). This EUA will remain  in effect (meaning this test can be used) for the duration of the COVID-19 declaration under Section 564(b)(1) of the Act, 21 U.S.C.section 360bbb-3(b)(1), unless the authorization is terminated  or revoked  sooner.       Influenza A by PCR NEGATIVE NEGATIVE Final   Influenza B by PCR NEGATIVE NEGATIVE Final    Comment: (NOTE) The Xpert Xpress SARS-CoV-2/FLU/RSV plus assay is intended as an aid in the diagnosis of influenza from Nasopharyngeal swab specimens and should not be used as a sole basis for treatment. Nasal washings and aspirates are unacceptable for Xpert Xpress SARS-CoV-2/FLU/RSV testing.  Fact Sheet for Patients: EntrepreneurPulse.com.au  Fact Sheet for Healthcare Providers: IncredibleEmployment.be  This test is not yet approved or cleared by the [place] FDA and has been authorized for detection and/or diagnosis of SARS-CoV-2 by FDA under an Emergency Use Authorization (EUA). This EUA will remain in effect (meaning this test can be used) for the duration of the COVID-19 declaration under Section 564(b)(1) of the Act, 21 U.S.C. section 360bbb-3(b)(1), unless the authorization is terminated or revoked.  Performed at [facility], [address] [place], [place] [address]      Time coordinating discharge: Over 30 minutes  SIGNED:   Nolberto Hanlon, MD  [facility] 02/02/2021, 9:31 AM Pager   If 7PM-7AM, please contact night-coverage [email or web address] Password [email or web address]

## 2021-02-05 ENCOUNTER — Encounter: Payer: Self-pay | Admitting: General Surgery

## 2021-02-05 MED ORDER — VISTASEAL 10 ML SINGLE DOSE KIT
PACK | CUTANEOUS | Status: DC | PRN
  Administered 2021-01-31: 10 mL via TOPICAL

## 2021-02-09 ENCOUNTER — Ambulatory Visit: Payer: BC Managed Care – PPO | Admitting: Family Medicine

## 2021-02-09 ENCOUNTER — Encounter: Payer: Self-pay | Admitting: Family Medicine

## 2021-02-09 ENCOUNTER — Other Ambulatory Visit: Payer: Self-pay

## 2021-02-09 VITALS — BP 136/80 | HR 81 | Temp 97.9°F | Ht 63.0 in | Wt 153.0 lb

## 2021-02-09 DIAGNOSIS — F17200 Nicotine dependence, unspecified, uncomplicated: Secondary | ICD-10-CM

## 2021-02-09 DIAGNOSIS — K8042 Calculus of bile duct with acute cholecystitis without obstruction: Secondary | ICD-10-CM | POA: Diagnosis not present

## 2021-02-09 LAB — CBC WITH DIFFERENTIAL/PLATELET
Basophils Absolute: 0.1 10*3/uL (ref 0.0–0.1)
Basophils Relative: 0.5 % (ref 0.0–3.0)
Eosinophils Absolute: 0.1 10*3/uL (ref 0.0–0.7)
Eosinophils Relative: 1.1 % (ref 0.0–5.0)
HCT: 40 % (ref 36.0–46.0)
Hemoglobin: 13.7 g/dL (ref 12.0–15.0)
Lymphocytes Relative: 31.8 % (ref 12.0–46.0)
Lymphs Abs: 3.4 10*3/uL (ref 0.7–4.0)
MCHC: 34.1 g/dL (ref 30.0–36.0)
MCV: 97.8 fl (ref 78.0–100.0)
Monocytes Absolute: 0.8 10*3/uL (ref 0.1–1.0)
Monocytes Relative: 8 % (ref 3.0–12.0)
Neutro Abs: 6.2 10*3/uL (ref 1.4–7.7)
Neutrophils Relative %: 58.6 % (ref 43.0–77.0)
Platelets: 389 10*3/uL (ref 150.0–400.0)
RBC: 4.09 Mil/uL (ref 3.87–5.11)
RDW: 13.5 % (ref 11.5–15.5)
WBC: 10.6 10*3/uL — ABNORMAL HIGH (ref 4.0–10.5)

## 2021-02-09 LAB — HEPATIC FUNCTION PANEL
ALT: 18 U/L (ref 0–35)
AST: 14 U/L (ref 0–37)
Albumin: 3.9 g/dL (ref 3.5–5.2)
Alkaline Phosphatase: 165 U/L — ABNORMAL HIGH (ref 39–117)
Bilirubin, Direct: 0.3 mg/dL (ref 0.0–0.3)
Total Bilirubin: 0.8 mg/dL (ref 0.2–1.2)
Total Protein: 6.7 g/dL (ref 6.0–8.3)

## 2021-02-09 LAB — BASIC METABOLIC PANEL
BUN: 13 mg/dL (ref 6–23)
CO2: 25 mEq/L (ref 19–32)
Calcium: 9.5 mg/dL (ref 8.4–10.5)
Chloride: 102 mEq/L (ref 96–112)
Creatinine, Ser: 0.58 mg/dL (ref 0.40–1.20)
GFR: 98.4 mL/min (ref >60.00)
Glucose, Bld: 100 mg/dL — ABNORMAL HIGH (ref 70–99)
Potassium: 4.1 mEq/L (ref 3.5–5.1)
Sodium: 137 mEq/L (ref 135–145)

## 2021-02-09 NOTE — Assessment & Plan Note (Signed)
S/p hospitalization with ERCP/stent and ccy while in the hospital Reviewed hospital records, lab results and studies in detail  Much improved now  Recovering from surgery and has appt with gen surg next week  Incisions look good/rev care  Lab today for hepatic,cbc, bmp Pending results  Adv slow adv of diet

## 2021-02-09 NOTE — Patient Instructions (Signed)
Take care of yourself  Eat light  Keep up a good fluid intake  Follow up with your surgeon as planned   Labs today   Let us know if you develop any new symptoms
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
## 2021-02-09 NOTE — Assessment & Plan Note (Signed)
Disc in detail risks of smoking and possible outcomes including copd, vascular/ heart disease, cancer , respiratory and sinus infections  Pt voices understanding Unfortunately continues to smoke

## 2021-02-13 ENCOUNTER — Other Ambulatory Visit: Payer: Self-pay

## 2021-02-13 ENCOUNTER — Ambulatory Visit (INDEPENDENT_AMBULATORY_CARE_PROVIDER_SITE_OTHER): Payer: BC Managed Care – PPO | Admitting: Physician Assistant

## 2021-02-13 ENCOUNTER — Telehealth: Payer: Self-pay | Admitting: Gastroenterology

## 2021-02-13 ENCOUNTER — Encounter: Payer: Self-pay | Admitting: Physician Assistant

## 2021-02-13 VITALS — BP 117/76 | HR 81 | Temp 98.8°F | Ht 63.5 in | Wt 152.8 lb

## 2021-02-13 DIAGNOSIS — K8042 Calculus of bile duct with acute cholecystitis without obstruction: Secondary | ICD-10-CM

## 2021-02-13 DIAGNOSIS — Z09 Encounter for follow-up examination after completed treatment for conditions other than malignant neoplasm: Secondary | ICD-10-CM

## 2021-02-13 DIAGNOSIS — K810 Acute cholecystitis: Secondary | ICD-10-CM

## 2021-02-13 NOTE — Patient Instructions (Signed)

## 2021-02-13 NOTE — Telephone Encounter (Signed)
Error. See patient message sent to [facility]

## 2021-02-13 NOTE — Progress Notes (Signed)
[facility] POST-OP OFFICE VISIT  02/13/2021  HPI: Amy Leonard is a 60 y.o. female 13 days s/p robotic assisted laparoscopic cholecystectomy for cholecystitis with choledocholithiasis with Dr Celine Ahr.   Since discharge, she reports that she has done great. She states that she feels a little weaker and more fatigued but this is slowly improving. Some incisional soreness but not needing any pain medications. No fever, chills, nausea, emesis, or diarrhea. She is able to tolerate PO without issues. No other complaints.   She did have recent labs with her PCP which showed near resolution in her leukocytosis to 10.6K, BMP was grossly normal, and hepatic panel showed resolution in her previous hyperbilirubinemia.   Vital signs: BP 117/76   Pulse 81   Temp 98.8 F (37.1 C) (Oral)   Ht 5' 3.5" (1.613 m)   Wt 152 lb 12.8 oz (69.3 kg)   SpO2 97%   BMI 26.64 kg/m    Physical Exam: Constitutional: Well appearing female, NAD Abdomen: Soft, non-tender, non-distended, no rebound/guarding Skin: Laparoscopic incisions have healed well, no erythema or drainage   Assessment/Plan: This is a 60 y.o. female 13 days s/p robotic assisted laparoscopic cholecystectomy for cholecystitis with choledocholithiasis   - Pain control prn; OTC medications  - Reviewed wound care  - Reviewed lifting restrictions; 4 weeks total from date of surgery   - Reviewed pathology: [place]; negative for malignancy   - She can return to surgery clinic on an as needed basis  -- Edison Simon, PA-C [facility] 02/13/2021, 10:23 AM [phone] M-F: 7am - 4pm

## 2021-02-22 ENCOUNTER — Ambulatory Visit (INDEPENDENT_AMBULATORY_CARE_PROVIDER_SITE_OTHER): Payer: BC Managed Care – PPO | Admitting: Gastroenterology

## 2021-02-22 ENCOUNTER — Encounter: Payer: Self-pay | Admitting: Gastroenterology

## 2021-02-22 ENCOUNTER — Other Ambulatory Visit: Payer: Self-pay

## 2021-02-22 VITALS — BP 125/72 | HR 90 | Ht 63.5 in | Wt 154.2 lb

## 2021-02-22 DIAGNOSIS — K8042 Calculus of bile duct with acute cholecystitis without obstruction: Secondary | ICD-10-CM

## 2021-02-22 NOTE — Progress Notes (Signed)
Primary Care Physician: Tower, Wynelle Fanny, MD  Primary Gastroenterologist:  Dr. Lucilla Lame  Chief Complaint  Patient presents with   Follow-up    ERCP on 01/29/21    HPI: Amy Leonard is a 60 y.o. female here for follow-up after having an ERCP.  The patient had been seen by Dr. Fuller Plan in the past for an EGD and colonoscopy and then was followed by Dr. Bonna Gains with a finding of common bile duct stones.  The patient had an ERCP with failure to remove all the stone so a stent was placed at that time.  The fluoroscopy of the procedure showed:    The patient is now here for follow-up after the ERCP.   The patient reports that she is feeling much better than when she was in the hospital but still has some postop pain.  Past Medical History:  Diagnosis Date   Anxiety    Anxiety and depression    Barrett's esophageal ulceration    Basal ganglia infarction ([place]) 04/17/2012   Breast cancer ([place]) 2014   BILATERAL LUMPECTOMY   Carpal tunnel syndrome    Chronic back pain    spinal stenosis   Depression    Elevated WBC count    nl diff   Erosive gastritis    Folliculitis 4/0/9811   GERD (gastroesophageal reflux disease)    Glaucoma    ?   History of chemotherapy 2014   BREAST CA   HLD (hyperlipidemia)    HSV infection    recurrent (side and buttox)   Hypertension    Migraine    Personal history of chemotherapy 2014   bilateral breast ca   Personal history of radiation therapy 2014   bilateral breast ca   Radiation 2014   BREAST CA   Tobacco abuse    Wears dentures    top    Current Outpatient Medications  Medication Sig Dispense Refill   anastrozole (ARIMIDEX) 1 MG tablet TAKE 1 TABLET DAILY 90 tablet 0   aspirin 81 MG chewable tablet Chew 1 tablet (81 mg total) by mouth daily. 90 tablet 3   atorvastatin (LIPITOR) 10 MG tablet Take 1 tablet (10 mg total) by mouth daily. 90 tablet 3   b complex vitamins tablet Take 2 tablets by mouth daily.     calcium carbonate  (TUMS - DOSED IN MG ELEMENTAL CALCIUM) 500 MG chewable tablet Chew by mouth as directed.     Calcium Carbonate-Vitamin D (CALCIUM + D PO) Take 1 tablet by mouth daily.     nicotine (NICODERM CQ - DOSED IN MG/24 HOURS) 21 mg/24hr patch Place 1 patch (21 mg total) onto the skin daily. 28 patch 0   omeprazole (PRILOSEC) 40 MG capsule Take 1 capsule (40 mg total) by mouth daily. 90 capsule 0   No current facility-administered medications for this visit.    Allergies as of 02/22/2021 - Review Complete 02/22/2021  Allergen Reaction Noted   Gabapentin Swelling 02/17/2017   Lyrica [pregabalin] Other (See Comments) 10/23/2015   Voltaren [diclofenac sodium] Nausea And Vomiting 05/05/2019   Bupropion hcl Nausea Only and Other (See Comments)    Chlorhexidine gluconate Itching and Rash 11/23/2012   Hydrocod polst-cpm polst er Nausea And Vomiting    Lisinopril Other (See Comments) 10/14/2012   Naproxen sodium Other (See Comments)     ROS:  General: Negative for anorexia, weight loss, fever, chills, fatigue, weakness. ENT: Negative for hoarseness, difficulty swallowing , nasal congestion. CV: Negative for  chest pain, angina, palpitations, dyspnea on exertion, peripheral edema.  Respiratory: Negative for dyspnea at rest, dyspnea on exertion, cough, sputum, wheezing.  GI: See history of present illness. GU:  Negative for dysuria, hematuria, urinary incontinence, urinary frequency, nocturnal urination.  Endo: Negative for unusual weight change.    Physical Examination:   BP 125/72 (BP Location: Left Arm, Patient Position: Sitting, Cuff Size: Normal)   Pulse 90   Ht 5' 3.5" (1.613 m)   Wt 154 lb 3.2 oz (69.9 kg)   BMI 26.89 kg/m   General: Well-nourished, well-developed in no acute distress.  Eyes: No icterus. Conjunctivae pink. Neuro: Alert and oriented x 3.  Grossly intact. Skin: Warm and dry, no jaundice.   Psych: Alert and cooperative, normal mood and affect.  Labs:    Imaging  Studies: DG C-Arm 1-60 Min-No Report  Result Date: 01/29/2021 Fluoroscopy was utilized by the requesting physician.  No radiographic interpretation.   US ABDOMEN LIMITED RUQ (LIVER/GB)  Result Date: 01/27/2021 CLINICAL DATA:  RIGHT upper quadrant pain, elevated liver function test. Back pain today. EXAM: ULTRASOUND ABDOMEN LIMITED RIGHT UPPER QUADRANT COMPARISON:  None. FINDINGS: Gallbladder: Gallbladder wall is thickened and edematous, measuring up to 7 mm thickness. Multiple mobile stones within the gallbladder fundus region, largest measuring 6 mm. Common bile duct: Diameter: 19 mm. Multiple obstructing stones are seen within the markedly distended common bile duct, and probable adjacent sludge. Liver: No focal lesion identified. Liver is diffusely echogenic indicating fatty infiltration. Portal vein is patent on color Doppler imaging with normal direction of blood flow towards the liver. Other: None. IMPRESSION: 1. Common bile duct is markedly distended, measuring 19 mm diameter, with multiple obstructing stones and sludge within the mid-to-distal CBD. 2. Gallbladder walls are thickened/edematous suggesting associated acute cholecystitis. 3. Cholelithiasis. 4. Fatty infiltration of the liver. Electronically Signed   By: Franki Cabot M.D.   On: 01/27/2021 17:24    Assessment and Plan:   Amy Leonard is a 60 y.o. y/o female Who comes in today after having an ERCP with multiple stones removed but a retained stone that could not be removed.  The patient had a stent placed in the bile duct and will need to have the stent removed with the stone removed.  The patient will be set up for repeat ERCP in 4-6 weeks.  She has been told that if she gets any fevers chills nausea or vomiting she should go to the ER for possible stent infection. The patient has been explained the plan and agrees with it.     Lucilla Lame, MD. [facility]    Note: This dictation was prepared with Dragon dictation along with smaller  phrase technology. Any transcriptional errors that result from this process are unintentional.

## 2021-02-27 ENCOUNTER — Other Ambulatory Visit: Payer: Self-pay

## 2021-02-27 DIAGNOSIS — Z4582 Encounter for adjustment or removal of myringotomy device (stent) (tube): Secondary | ICD-10-CM

## 2021-02-28 ENCOUNTER — Other Ambulatory Visit: Payer: Self-pay | Admitting: Internal Medicine

## 2021-04-03 ENCOUNTER — Ambulatory Visit: Payer: BC Managed Care – PPO | Admitting: Certified Registered"

## 2021-04-03 ENCOUNTER — Encounter: Payer: Self-pay | Admitting: Gastroenterology

## 2021-04-03 ENCOUNTER — Ambulatory Visit: Payer: BC Managed Care – PPO

## 2021-04-03 ENCOUNTER — Ambulatory Visit
Admission: RE | Admit: 2021-04-03 | Discharge: 2021-04-03 | Disposition: A | Discharge status: Home / Self Care | Payer: BC Managed Care – PPO | Attending: Gastroenterology | Admitting: Gastroenterology

## 2021-04-03 ENCOUNTER — Encounter
Admission: RE | Disposition: A | Discharge status: Home / Self Care | Payer: Self-pay | Source: Home / Self Care | Attending: Gastroenterology

## 2021-04-03 ENCOUNTER — Other Ambulatory Visit: Payer: Self-pay

## 2021-04-03 DIAGNOSIS — Z886 Allergy status to analgesic agent status: Secondary | ICD-10-CM | POA: Diagnosis not present

## 2021-04-03 DIAGNOSIS — F17210 Nicotine dependence, cigarettes, uncomplicated: Secondary | ICD-10-CM | POA: Insufficient documentation

## 2021-04-03 DIAGNOSIS — Z4659 Encounter for fitting and adjustment of other gastrointestinal appliance and device: Secondary | ICD-10-CM | POA: Insufficient documentation

## 2021-04-03 DIAGNOSIS — C50912 Malignant neoplasm of unspecified site of left female breast: Secondary | ICD-10-CM | POA: Diagnosis not present

## 2021-04-03 DIAGNOSIS — Z9221 Personal history of antineoplastic chemotherapy: Secondary | ICD-10-CM | POA: Insufficient documentation

## 2021-04-03 DIAGNOSIS — K8050 Calculus of bile duct without cholangitis or cholecystitis without obstruction: Secondary | ICD-10-CM

## 2021-04-03 DIAGNOSIS — Z4582 Encounter for adjustment or removal of myringotomy device (stent) (tube): Secondary | ICD-10-CM

## 2021-04-03 DIAGNOSIS — Z885 Allergy status to narcotic agent status: Secondary | ICD-10-CM | POA: Diagnosis not present

## 2021-04-03 DIAGNOSIS — Z888 Allergy status to other drugs, medicaments and biological substances status: Secondary | ICD-10-CM | POA: Diagnosis not present

## 2021-04-03 DIAGNOSIS — Z79811 Long term (current) use of aromatase inhibitors: Secondary | ICD-10-CM | POA: Diagnosis not present

## 2021-04-03 DIAGNOSIS — Z79899 Other long term (current) drug therapy: Secondary | ICD-10-CM | POA: Diagnosis not present

## 2021-04-03 DIAGNOSIS — Z923 Personal history of irradiation: Secondary | ICD-10-CM | POA: Insufficient documentation

## 2021-04-03 DIAGNOSIS — C50911 Malignant neoplasm of unspecified site of right female breast: Secondary | ICD-10-CM | POA: Diagnosis not present

## 2021-04-03 DIAGNOSIS — Z7982 Long term (current) use of aspirin: Secondary | ICD-10-CM | POA: Insufficient documentation

## 2021-04-03 HISTORY — PX: ERCP: SHX5425

## 2021-04-03 SURGERY — ERCP, WITH INTERVENTION IF INDICATED
Anesthesia: General

## 2021-04-03 MED ORDER — GLYCOPYRROLATE 0.2 MG/ML IJ SOLN
INTRAMUSCULAR | Status: DC | PRN
  Administered 2021-04-03: .2 mg via INTRAVENOUS

## 2021-04-03 MED ORDER — PROPOFOL 10 MG/ML IV BOLUS
INTRAVENOUS | Status: DC | PRN
  Administered 2021-04-03: 70 mg via INTRAVENOUS
  Administered 2021-04-03: 30 mg via INTRAVENOUS

## 2021-04-03 MED ORDER — LIDOCAINE 2% (20 MG/ML) 5 ML SYRINGE
INTRAMUSCULAR | Status: DC | PRN
  Administered 2021-04-03: 20 mg via INTRAVENOUS

## 2021-04-03 MED ORDER — METOPROLOL TARTRATE 5 MG/5ML IV SOLN
INTRAVENOUS | Status: AC
  Filled 2021-04-03: qty 5

## 2021-04-03 MED ORDER — MIDAZOLAM HCL 2 MG/2ML IJ SOLN
INTRAMUSCULAR | Status: AC
  Filled 2021-04-03: qty 2

## 2021-04-03 MED ORDER — GLYCOPYRROLATE 0.2 MG/ML IJ SOLN
INTRAMUSCULAR | Status: AC
  Filled 2021-04-03: qty 1

## 2021-04-03 MED ORDER — LACTATED RINGERS IV SOLN: INTRAVENOUS | Status: DC

## 2021-04-03 MED ORDER — SODIUM CHLORIDE 0.9 % IV SOLN: INTRAVENOUS | Status: DC

## 2021-04-03 MED ORDER — PROPOFOL 500 MG/50ML IV EMUL
INTRAVENOUS | Status: DC | PRN
  Administered 2021-04-03: 150 ug/kg/min via INTRAVENOUS

## 2021-04-03 MED ORDER — MIDAZOLAM HCL 5 MG/5ML IJ SOLN
INTRAMUSCULAR | Status: DC | PRN
  Administered 2021-04-03: 2 mg via INTRAVENOUS

## 2021-04-03 MED ORDER — METOPROLOL TARTRATE 5 MG/5ML IV SOLN
INTRAVENOUS | Status: DC | PRN
  Administered 2021-04-03 (×2): 2.5 mg via INTRAVENOUS

## 2021-04-03 NOTE — Op Note (Signed)
[facility] Gastroenterology Patient Name: Amy Leonard Procedure Date: 04/03/2021 11:48 AM MRN: [id number] Account #: [id number] Date of Birth: 09/09/1960 Admit Type: Outpatient Age: 60 Room: [facility] ENDO ROOM 4 Gender: Female Note Status: Finalized Procedure:             ERCP Indications:           Common bile duct stone(s) Providers:             Lucilla Lame MD, MD Medicines:             Propofol per Anesthesia Complications:         No immediate complications. Procedure:             Pre-Anesthesia Assessment:                        - Prior to the procedure, a History and Physical was                         performed, and patient medications and allergies were                         reviewed. The patient's tolerance of previous                         anesthesia was also reviewed. The risks and benefits                         of the procedure and the sedation options and risks                         were discussed with the patient. All questions were                         answered, and informed consent was obtained. Prior                         Anticoagulants: The patient has taken no previous                         anticoagulant or antiplatelet agents. ASA Grade                         Assessment: II - A patient with mild systemic disease.                         After reviewing the risks and benefits, the patient                         was deemed in satisfactory condition to undergo the                         procedure.                        After obtaining informed consent, the scope was passed                         under direct vision. Throughout the procedure, the  patient's blood pressure, pulse, and oxygen                         saturations were monitored continuously. The [employer] D single use duodenoscope was                         introduced through the mouth, and used to  inject                         contrast into and used to inject contrast into the                         bile duct. The ERCP was accomplished without                         difficulty. The patient tolerated the procedure well. Findings:      A biliary stent was visible on the scout film. One stent was removed       from the common hepatic duct using a snare. A wire was passed into the       biliary tree. Biliary sphincterotomy was made with a traction (standard)       sphincterotome using ERBE electrocautery. There was no       post-sphincterotomy bleeding. The biliary tree was swept with a 15 mm       balloon starting at the bifurcation. All stones were removed. Impression:            - Choledocholithiasis was found. Complete removal was                         accomplished by biliary sphincterotomy and balloon                         extraction.                        - One stent was removed from the common hepatic duct.                        - A biliary sphincterotomy was performed.                        - The biliary tree was swept. Recommendation:        - Discharge patient to home.                        - Resume previous diet.                        - Continue present medications. Procedure Code(s):     --- Professional ---                        [phone], Endoscopic retrograde cholangiopancreatography                         (ERCP); with removal of foreign body(s) or stent(s)  from biliary/pancreatic duct(s)                        [phone], Endoscopic retrograde cholangiopancreatography                         (ERCP); with removal of calculi/debris from                         biliary/pancreatic duct(s)                        [id number], Endoscopic retrograde cholangiopancreatography                         (ERCP); with sphincterotomy/papillotomy Diagnosis Code(s):     --- Professional ---                        K80.50, Calculus of bile duct without cholangitis  or                         cholecystitis without obstruction                        Z46.59, Encounter for fitting and adjustment of other                         gastrointestinal appliance and device CPT copyright 2019 [employer] Association. All rights reserved. The codes documented in this report are preliminary and upon coder review may  be revised to meet current compliance requirements. Lucilla Lame MD, MD 04/03/2021 12:41:31 PM This report has been signed electronically. Number of Addenda: 0 Note Initiated On: 04/03/2021 11:48 AM Estimated Blood Loss:  Estimated blood loss: none.      [facility]

## 2021-04-03 NOTE — H&P (Signed)
Lucilla Lame, MD [facility] [address], [place], [place] [address] Phone:[phone] Fax : [phone]  Primary Care Physician:  Tower, Wynelle Fanny, MD Primary Gastroenterologist:  Dr. Allen Norris  Pre-Procedure History & Physical: HPI:  Amy Leonard is a 60 y.o. female is here for an ERCP.   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   BREAST LUMPECTOMY Bilateral 2014   bilateral breast ca   BTL     COLONOSCOPY     epidural steroid injection  2008   ERCP N/A 01/29/2021   Procedure: ENDOSCOPIC RETROGRADE CHOLANGIOPANCREATOGRAPHY (ERCP);  Surgeon: Lucilla Lame, MD;  Location: [facility] END[facility]COPY;  Service: Endoscopy;  Laterality: N/A;   LUMBAR FUSION  03/2016   L4-5   PARTIAL MASTECTOMY WITH NEEDLE LOCALIZATION AND AXILLARY SENTINEL LYMPH NODE BX Bilateral 11/23/2012   Procedure: PARTIAL MASTECTOMY WITH NEEDLE LOCALIZATION AND AXILLARY SENTINEL LYMPH NODE BX;  Surgeon: Adin Hector, MD;  Location: [place];  Service: General;  Laterality: Bilateral;   PORT-A-CATH REMOVAL Right 08/25/2013    Procedure: REMOVAL PORT-A-CATH;  Surgeon: Adin Hector, MD;  Location: [place];  Service: General;  Laterality: Right;   PORTACATH PLACEMENT Right 11/23/2012   Procedure: INSERTION PORT-A-CATH;  Surgeon: Adin Hector, MD;  Location: [place];  Service: General;  Laterality: Right;   TUBAL LIGATION     UPPER GI ENDOSCOPY      Prior to Admission medications   Medication Sig Start Date End Date Taking? Authorizing Provider  anastrozole (ARIMIDEX) 1 MG tablet TAKE 1 TABLET DAILY 02/28/21  Yes Cammie Sickle, MD  aspirin 81 MG chewable tablet Chew 1 tablet (81 mg total) by mouth daily. 02/10/15  Yes Tower, Wynelle Fanny, MD  atorvastatin (LIPITOR) 10 MG tablet Take 1 tablet (10 mg total) by mouth daily. 09/29/20  Yes Tower, Wynelle Fanny, MD  b complex vitamins tablet Take 2 tablets by mouth daily.   Yes [provider]  calcium carbonate (TUMS - DOSED IN MG ELEMENTAL CALCIUM) 500 MG chewable tablet Chew by mouth as directed.   Yes [provider]  Calcium Carbonate-Vitamin D (CALCIUM + D PO) Take 1 tablet by mouth daily.   Yes [provider]  nicotine (NICODERM CQ - DOSED IN MG/24 HOURS) 21 mg/24hr patch Place 1 patch (21 mg total) onto the skin daily. 02/03/21  Yes Nolberto Hanlon, MD  omeprazole (PRILOSEC) 40 MG capsule Take 1 capsule (40 mg total) by mouth daily. 02/03/15  Yes Ladene Artist, MD    Allergies as of 02/27/2021 - Review Complete 02/22/2021  Allergen Reaction Noted   Gabapentin Swelling 02/17/2017   Lyrica [pregabalin] Other (See Comments) 10/23/2015   Voltaren [diclofenac sodium] Nausea And Vomiting 05/05/2019   Bupropion hcl Nausea Only and Other (See Comments)    Chlorhexidine gluconate Itching and Rash 11/23/2012   Hydrocod polst-cpm polst er Nausea And Vomiting    Lisinopril Other (See Comments) 10/14/2012   Naproxen sodium Other (See Comments)     Family History  Problem Relation Age of Onset   Stroke  Maternal Grandmother    Diabetes Maternal Grandmother    Diabetes Mother    Hypertension Mother    Leukemia Mother    Obesity Mother    Depression Sister    Bipolar disorder Son        Bipolar / oppositional defiant   Alcohol abuse Sister    Drug abuse Sister    Alcohol abuse Sister    Drug abuse Sister    Alcohol abuse Brother    Drug abuse Brother    Pancreatic cancer Brother    Colon cancer Neg Hx    Breast cancer Neg Hx     Social History   Socioeconomic History   Marital status: Married    Spouse name: Not on file   Number of children: Not on file   Years of education: Not on file   Highest education level: Not on file  Occupational History   Occupation: Full time and PT job  Tobacco Use   Smoking status: Every Day    Packs/day: 0.50    Years: 33.00    Pack years: 16.50    Types: Cigarettes   Smokeless tobacco: Never  Vaping Use   Vaping Use: Never used  Substance and Sexual Activity   Alcohol use: Yes    Alcohol/week: 0.0 standard drinks    Comment: beer rarely   Drug use: No   Sexual activity: Not Currently  Other Topics Concern   Not on file  Social History Narrative   Separated; full time and part time job; no regular exercise.             Social Determinants of Health   Financial Resource Strain: Not on file  Food Insecurity: Not on file  Transportation Needs: Not on file  Physical Activity: Not on file  Stress: Not on file  Social Connections: Not on file  Intimate Partner Violence: Not on file    Review of Systems: See HPI, otherwise negative ROS  Physical Exam: BP 127/79   Pulse 76   Temp (!) 96.8 F (36 C) (Temporal)   Resp 18   Ht '5\' 3"'$  (1.6 m)   Wt 69.4 kg   SpO2 97%   BMI 27.10 kg/m  General:   Alert,  pleasant and cooperative in NAD Head:  Normocephalic and atraumatic. Neck:  Supple; no masses or thyromegaly. Lungs:  Clear throughout to auscultation.    Heart:  Regular rate and rhythm. Abdomen:  Soft, nontender and  nondistended. Normal bowel sounds, without guarding, and without rebound.   Neurologic:  Alert and  oriented x4;  grossly normal neurologically.  Impression/Plan: Amy Leonard is here for an ERCP to be performed for CBD stones  Risks, benefits, limitations, and alternatives regarding  ERCP have been reviewed with the patient.  Questions have been answered.  All parties agreeable.   Lucilla Lame, MD  04/03/2021, 11:43 AM

## 2021-04-03 NOTE — Anesthesia Postprocedure Evaluation (Signed)
Anesthesia Post Note  Patient: Marleena Mckeel Tomey  Procedure(s) Performed: ENDOSCOPIC RETROGRADE CHOLANGIOPANCREATOGRAPHY (ERCP)  Patient location during evaluation: PACU Anesthesia Type: General Level of consciousness: awake and alert Pain management: pain level controlled Vital Signs Assessment: post-procedure vital signs reviewed and stable Respiratory status: spontaneous breathing, nonlabored ventilation, respiratory function stable and patient connected to nasal cannula oxygen Cardiovascular status: blood pressure returned to baseline and stable Postop Assessment: no apparent nausea or vomiting Anesthetic complications: no   No notable events documented.   Last Vitals:  Vitals:   04/03/21 1257 04/03/21 1307  BP: (!) 163/97 (!) 169/80  Pulse: 94 90  Resp: 20 12  Temp:    SpO2: 99% 97%    Last Pain:  Vitals:   04/03/21 1307  TempSrc:   PainSc: 0-No pain                 Tamasha Laplante Doyne Keel

## 2021-04-03 NOTE — Anesthesia Preprocedure Evaluation (Signed)
Anesthesia Evaluation  Patient identified by MRN, date of birth, ID band Patient awake    Reviewed: Allergy & Precautions, NPO status   History of Anesthesia Complications Negative for: history of anesthetic complications  Airway Mallampati: II  TM Distance: >3 FB Neck ROM: Full    Dental no notable dental hx. (+) Teeth Intact   Pulmonary Current Smoker and Patient abstained from smoking.,    Pulmonary exam normal        Cardiovascular Exercise Tolerance: Good hypertension, Normal cardiovascular examI     Neuro/Psych    GI/Hepatic GERD  Controlled and Medicated,  Endo/Other    Renal/GU      Musculoskeletal   Abdominal Normal abdominal exam  (+)   Peds  Hematology   Anesthesia Other Findings   Reproductive/Obstetrics                             Anesthesia Physical Anesthesia Plan  ASA: 2  Anesthesia Plan: General   Post-op Pain Management:    Induction: Intravenous  PONV Risk Score and Plan:   Airway Management Planned: Simple Face Mask  Additional Equipment:   Intra-op Plan:   Post-operative Plan:   Informed Consent: I have reviewed the patients History and Physical, chart, labs and discussed the procedure including the risks, benefits and alternatives for the proposed anesthesia with the patient or authorized representative who has indicated his/her understanding and acceptance.       Plan Discussed with: CRNA  Anesthesia Plan Comments:         Anesthesia Quick Evaluation

## 2021-04-03 NOTE — Transfer of Care (Signed)
Immediate Anesthesia Transfer of Care Note  Patient: Jamiera Phi Torrance  Procedure(s) Performed: ENDOSCOPIC RETROGRADE CHOLANGIOPANCREATOGRAPHY (ERCP)  Patient Location: Endoscopy Unit  Anesthesia Type:General  Level of Consciousness: awake and drowsy  Airway & Oxygen Therapy: Patient Spontanous Breathing  Post-op Assessment: Report given to RN and Post -op Vital signs reviewed and stable  Post vital signs: Reviewed and stable  Last Vitals:  Vitals Value Taken Time  BP 131/98 04/03/21 1238  Temp    Pulse 95 04/03/21 1240  Resp 32 04/03/21 1240  SpO2 94 % 04/03/21 1240  Vitals shown include unvalidated device data.  Last Pain:  Vitals:   04/03/21 1102  TempSrc: Temporal  PainSc: 0-No pain         Complications: No notable events documented.

## 2021-04-04 ENCOUNTER — Encounter: Payer: Self-pay | Admitting: Gastroenterology

## 2021-05-22 ENCOUNTER — Other Ambulatory Visit: Payer: Self-pay | Admitting: Internal Medicine

## 2021-06-04 ENCOUNTER — Encounter: Payer: Self-pay | Admitting: General Surgery

## 2021-06-26 ENCOUNTER — Encounter: Payer: Self-pay | Admitting: Family Medicine

## 2021-07-10 NOTE — Telephone Encounter (Signed)
Lvm  for pt  with billing phone #

## 2021-07-12 ENCOUNTER — Ambulatory Visit (INDEPENDENT_AMBULATORY_CARE_PROVIDER_SITE_OTHER): Payer: BC Managed Care – PPO | Admitting: Podiatry

## 2021-07-12 ENCOUNTER — Other Ambulatory Visit: Payer: Self-pay

## 2021-07-12 DIAGNOSIS — Q828 Other specified congenital malformations of skin: Secondary | ICD-10-CM

## 2021-07-12 DIAGNOSIS — Q6672 Congenital pes cavus, left foot: Secondary | ICD-10-CM | POA: Diagnosis not present

## 2021-07-12 DIAGNOSIS — Q6671 Congenital pes cavus, right foot: Secondary | ICD-10-CM | POA: Diagnosis not present

## 2021-07-12 DIAGNOSIS — Q667 Congenital pes cavus, unspecified foot: Secondary | ICD-10-CM | POA: Diagnosis not present

## 2021-07-12 NOTE — Progress Notes (Signed)
Subjective:  Patient ID: Amy Leonard, female    DOB: 04-26-1961,  MRN: [id number]  Chief Complaint  Patient presents with   Callouses    Bilateral callus on the outside of foot     60 y.o. female presents with the above complaint.  Patient presents with complaint of pes cavus foot deformity.  Patient states that she is putting excessive stress on submetatarsal 5 head leading to hyperkeratotic lesion/porokeratotic lesion.  Patient states is painful to touch painful to walk on.  She states has progressed to gotten worse.  She would like to discuss treatment options she wears [facility] for support which seems to help.  She is tried self debridement at home with Dremel none of which has gotten better.  She denies wearing any orthotics.   Review of Systems: Negative except as noted in the HPI. Denies N/V/F/Ch.  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   History of chemotherapy 2014   BREAST CA   HLD (hyperlipidemia)    HSV infection    recurrent (side and buttox)   Hypertension    Migraine    Personal history of chemotherapy 2014   bilateral breast ca   Personal history of radiation therapy 2014   bilateral breast ca   Radiation 2014   BREAST CA   Tobacco abuse    Wears dentures    top    Current Outpatient Medications:    anastrozole (ARIMIDEX) 1 MG tablet, TAKE 1 TABLET DAILY, Disp: 90 tablet, Rfl: 0   aspirin 81 MG chewable tablet, Chew 1 tablet (81 mg total) by mouth daily., Disp: 90 tablet, Rfl: 3   atorvastatin (LIPITOR) 10 MG tablet, Take 1 tablet (10 mg total) by mouth  daily., Disp: 90 tablet, Rfl: 3   b complex vitamins tablet, Take 2 tablets by mouth daily., Disp: , Rfl:    calcium carbonate (TUMS - DOSED IN MG ELEMENTAL CALCIUM) 500 MG chewable tablet, Chew by mouth as directed., Disp: , Rfl:    Calcium Carbonate-Vitamin D (CALCIUM + D PO), Take 1 tablet by mouth daily., Disp: , Rfl:    nicotine (NICODERM CQ - DOSED IN MG/24 HOURS) 21 mg/24hr patch, Place 1 patch (21 mg total) onto the skin daily., Disp: 28 patch, Rfl: 0   omeprazole (PRILOSEC) 40 MG capsule, Take 1 capsule (40 mg total) by mouth daily., Disp: 90 capsule, Rfl: 0  Social History   Tobacco Use  Smoking Status Every Day   Packs/day: 0.50   Years: 33.00   Pack years: 16.50   Types: Cigarettes  Smokeless Tobacco Never    Allergies  Allergen Reactions   Gabapentin Swelling   Lyrica [Pregabalin] Other (See Comments)    sedated   Voltaren [Diclofenac Sodium] Nausea And Vomiting   Bupropion Hcl Nausea Only and Other (See Comments)     GI, sleepy   Chlorhexidine Gluconate Itching and Rash   Hydrocod Polst-Cpm Polst Er Nausea And Vomiting    vomiting   Lisinopril Other (  See Comments)    Leg cramps     Naproxen Sodium Other (See Comments)    does not tolerate   Objective:  There were no vitals filed for this visit. There is no height or weight on file to calculate BMI. Constitutional Well developed. Well nourished.  Vascular Dorsalis pedis pulses palpable bilaterally. Posterior tibial pulses palpable bilaterally. Capillary refill normal to all digits.  No cyanosis or clubbing noted. Pedal hair growth normal.  Neurologic Normal speech. Oriented to person, place, and time. Epicritic sensation to light touch grossly present bilaterally.  Dermatologic Hyperkeratotic lesion with central nucleated core noted to bilateral submetatarsal 5.  Pain on palpation.  Gait examination shows pes cavus foot structure with excessive stress to the heel submit 5 and 7 at 1 bilaterally.   Orthopedic: Normal joint ROM without pain or crepitus bilaterally. No visible deformities. No bony tenderness.   Radiographs: None Assessment:   1. Pes cavus   2. Porokeratosis    Plan:  Patient was evaluated and treated and all questions answered.  I explained to the patient pes cavus deformity with underlying submetatarsal 5 porokeratotic lesion -I explained the patient the etiology of porokeratosis and various treatment options were discussed.  I discussed with the patient the etiology/the driving force behind the deformity is the high arch foot structure.  She seems to be walking and a little supinated foot structure leading to excessive pressure and wear-and-tear on the outside part of the foot.  At this time I discussed with the patient she will benefit from orthotics management.  I will also incorporate offloading of submetatarsal 5.  Patient agrees with plan like to obtain orthotics -She was casted for orthotics with submit 5 offloading -If there is no improvement we will discuss floating osteotomy during next clinic visit  No follow-ups on file.

## 2021-07-27 ENCOUNTER — Other Ambulatory Visit: Payer: Self-pay | Admitting: Family Medicine

## 2021-07-27 ENCOUNTER — Other Ambulatory Visit: Payer: Self-pay | Admitting: Internal Medicine

## 2021-07-27 DIAGNOSIS — Z1231 Encounter for screening mammogram for malignant neoplasm of breast: Secondary | ICD-10-CM

## 2021-07-30 ENCOUNTER — Ambulatory Visit (INDEPENDENT_AMBULATORY_CARE_PROVIDER_SITE_OTHER): Payer: BC Managed Care – PPO | Admitting: Vascular Surgery

## 2021-07-30 ENCOUNTER — Other Ambulatory Visit: Payer: Self-pay

## 2021-07-30 ENCOUNTER — Other Ambulatory Visit (INDEPENDENT_AMBULATORY_CARE_PROVIDER_SITE_OTHER): Payer: Self-pay | Admitting: Vascular Surgery

## 2021-07-30 ENCOUNTER — Ambulatory Visit (INDEPENDENT_AMBULATORY_CARE_PROVIDER_SITE_OTHER): Payer: BC Managed Care – PPO

## 2021-07-30 ENCOUNTER — Encounter (INDEPENDENT_AMBULATORY_CARE_PROVIDER_SITE_OTHER): Payer: Self-pay | Admitting: Vascular Surgery

## 2021-07-30 VITALS — BP 146/90 | HR 87 | Resp 16 | Ht 63.0 in | Wt 156.8 lb

## 2021-07-30 DIAGNOSIS — I10 Essential (primary) hypertension: Secondary | ICD-10-CM | POA: Diagnosis not present

## 2021-07-30 DIAGNOSIS — I8310 Varicose veins of unspecified lower extremity with inflammation: Secondary | ICD-10-CM

## 2021-07-30 DIAGNOSIS — E7800 Pure hypercholesterolemia, unspecified: Secondary | ICD-10-CM | POA: Diagnosis not present

## 2021-07-30 DIAGNOSIS — I83819 Varicose veins of unspecified lower extremities with pain: Secondary | ICD-10-CM

## 2021-07-30 DIAGNOSIS — M5136 Other intervertebral disc degeneration, lumbar region: Secondary | ICD-10-CM | POA: Diagnosis not present

## 2021-07-30 NOTE — Progress Notes (Signed)
MRN : [id number]  Amy Leonard is a 60 y.o. (21-Jun-1961) female who presents with chief complaint of check varicose veins.  History of Present Illness:   The patient is seen for evaluation of symptomatic varicose veins. The patient relates burning and stinging which worsened steadily throughout the course of the day, particularly with standing. The patient also notes an aching and throbbing pain over the varicosities, particularly with prolonged dependent positions. The symptoms are significantly improved with elevation.  The patient also notes that during hot weather the symptoms are greatly intensified. The patient states the pain from the varicose veins interferes with work, daily exercise, shopping and household maintenance. At this point, the symptoms are persistent and severe enough that they're having a negative impact on lifestyle and are interfering with daily activities.  There is no history of DVT, PE or superficial thrombophlebitis. There is no history of ulceration or hemorrhage. The patient denies a significant family history of varicose veins.   The patient has not worn graduated compression in the past. At the present time the patient has not been using over-the-counter analgesics. There is no history of prior surgical intervention or sclerotherapy.    No outpatient medications have been marked as taking for the 07/30/21 encounter (Appointment) with Delana Meyer, Dolores Lory, MD.    Past Medical History:  Diagnosis Date   Anxiety    Anxiety and depression    Barrett's esophageal ulceration    Basal ganglia infarction ([place]) 04/17/2012   Breast cancer ([place]) 2014   BILATERAL LUMPECTOMY   Carpal tunnel syndrome    Chronic back pain    spinal stenosis   Depression    Elevated WBC count    nl diff   Erosive gastritis    Folliculitis 0/10/90   GERD (gastroesophageal reflux disease)    Glaucoma    ?   History of chemotherapy 2014   BREAST CA   HLD (hyperlipidemia)     HSV infection    recurrent (side and buttox)   Hypertension    Migraine    Personal history of chemotherapy 2014   bilateral breast ca   Personal history of radiation therapy 2014   bilateral breast ca   Radiation 2014   BREAST CA   Tobacco abuse    Wears dentures    top    Past Surgical History:  Procedure Laterality Date   BREAST BIOPSY Bilateral 2014   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   BREAST LUMPECTOMY Bilateral 2014   bilateral breast ca   BTL     COLONOSCOPY     epidural steroid injection  2008   ERCP N/A 01/29/2021   Procedure: ENDOSCOPIC RETROGRADE CHOLANGIOPANCREATOGRAPHY (ERCP);  Surgeon: Lucilla Lame, MD;  Location[facility] ENDOSCOPY;  Service: Endoscopy;  Laterality: N/A;   ERCP N/A 04/03/2021   Procedure: ENDOSCOPIC RETROGRADE CHOLANGIOPANCREATOGRAPHY (ERCP);  Surgeon: Lucilla Lame, MD;  Location: [facility] ENDOSCOPY;  Service: Endoscopy;  Laterality: N/A;   LUMBAR FUSION  03/2016   L4-5   PARTIAL MASTECTOMY WITH NEEDLE LOCALIZATION AND AXILLARY SENTINEL LYMPH NODE BX Bilateral 11/23/2012   Procedure: PARTIAL MASTECTOMY WITH NEEDLE LOCALIZATION AND AXILLARY SENTINEL LYMPH NODE BX;  Surgeon: Adin Hector, MD;  Location: [place];  Service: General;  Laterality: Bilateral;   PORT-A-CATH REMOVAL Right 08/25/2013   Procedure: REMOVAL PORT-A-CATH;  Surgeon: Adin Hector, MD;  Location: [place];  Service: General;  Laterality: Right;   PORTACATH PLACEMENT Right 11/23/2012  Procedure: INSERTION PORT-A-CATH;  Surgeon: Adin Hector, MD;  Location: [place];  Service: General;  Laterality: Right;   TUBAL LIGATION     UPPER GI ENDOSCOPY      Social History Social History   Tobacco Use   Smoking status: Every Day    Packs/day: 0.50    Years: 33.00    Pack years: 16.50    Types: Cigarettes   Smokeless tobacco: Never  Vaping Use   Vaping Use: Never used  Substance Use Topics   Alcohol use:  Yes    Alcohol/week: 0.0 standard drinks    Comment: beer rarely   Drug use: No    Family History Family History  Problem Relation Age of Onset   Stroke Maternal Grandmother    Diabetes Maternal Grandmother    Diabetes Mother    Hypertension Mother    Leukemia Mother    Obesity Mother    Depression Sister    Bipolar disorder Son        Bipolar / oppositional defiant   Alcohol abuse Sister    Drug abuse Sister    Alcohol abuse Sister    Drug abuse Sister    Alcohol abuse Brother    Drug abuse Brother    Pancreatic cancer Brother    Colon cancer Neg Hx    Breast cancer Neg Hx     Allergies  Allergen Reactions   Gabapentin Swelling   Lyrica [Pregabalin] Other (See Comments)    sedated   Voltaren [Diclofenac Sodium] Nausea And Vomiting   Bupropion Hcl Nausea Only and Other (See Comments)     GI, sleepy   Chlorhexidine Gluconate Itching and Rash   Hydrocod Polst-Cpm Polst Er Nausea And Vomiting    vomiting   Lisinopril Other (See Comments)    Leg cramps     Naproxen Sodium Other (See Comments)    does not tolerate     REVIEW OF SYSTEMS (Negative unless checked)  Constitutional: [] Weight loss  [] Fever  [] Chills Cardiac: [] Chest pain   [] Chest pressure   [] Palpitations   [] Shortness of breath when laying flat   [] Shortness of breath with exertion. Vascular:  [] Pain in legs with walking   [x] Pain in legs at rest  [] History of DVT   [] Phlebitis   [x] Swelling in legs   [x] Varicose veins   [] Non-healing ulcers Pulmonary:   [] Uses home oxygen   [] Productive cough   [] Hemoptysis   [] Wheeze  [] COPD   [] Asthma Neurologic:  [] Dizziness   [] Seizures   [] History of stroke   [] History of TIA  [] Aphasia   [] Vissual changes   [] Weakness or numbness in arm   [] Weakness or numbness in leg Musculoskeletal:   [] Joint swelling   [x] Joint pain   [x] Low back pain Hematologic:  [] Easy bruising  [] Easy bleeding   [] Hypercoagulable state   [] Anemic Gastrointestinal:  [] Diarrhea   [] Vomiting   [] Gastroesophageal reflux/heartburn   [] Difficulty swallowing. Genitourinary:  [] Chronic kidney disease   [] Difficult urination  [] Frequent urination   [] Blood in urine Skin:  [] Rashes   [] Ulcers  Psychological:  [] History of anxiety   []  History of major depression.  Physical Examination  There were no vitals filed for this visit. There is no height or weight on file to calculate BMI. Gen: WD/WN, NAD Head: [place]/AT, No temporalis wasting.  Ear/Nose/Throat: Hearing grossly intact, nares w/o erythema or drainage, pinna without lesions Eyes: PER, EOMI, sclera nonicteric.  Neck: Supple, no gross masses.  No JVD.  Pulmonary:  Good air movement, no audible wheezing, no use of accessory muscles.  Cardiac: RRR, precordium not hyperdynamic. Vascular:  scattered varicosities present bilaterally.  Mild venous stasis changes to the legs bilaterally.  Trace soft pitting edema  Vessel Right Left  Radial Palpable Palpable  Gastrointestinal: soft, non-distended. No guarding/no peritoneal signs.  Musculoskeletal: M/S 5/5 throughout.  No deformity.  Neurologic: CN 2-12 intact. Pain and light touch intact in extremities.  Symmetrical.  Speech is fluent. Motor exam as listed above. Psychiatric: Judgment intact, Mood & affect appropriate for pt's clinical situation. Dermatologic: Venous rashes no ulcers noted.  No changes consistent with cellulitis. Lymph : No lichenification or skin changes of chronic lymphedema.  CBC Lab Results  Component Value Date   WBC 10.6 (H) 02/09/2021   HGB 13.7 02/09/2021   HCT 40.0 02/09/2021   MCV 97.8 02/09/2021   PLT 389.0 02/09/2021    BMET    Component Value Date/Time   NA 137 02/09/2021 1131   NA 140 07/25/2014 1100   NA 140 11/02/2013 1040   K 4.1 02/09/2021 1131   K 4.1 07/25/2014 1100   K 3.9 11/02/2013 1040   CL 102 02/09/2021 1131   CL 105 07/25/2014 1100   CL 109 (H) 02/17/2013 1120   CO2 25 02/09/2021 1131   CO2 30 07/25/2014 1100   CO2 24  11/02/2013 1040   GLUCOSE 100 (H) 02/09/2021 1131   GLUCOSE 97 07/25/2014 1100   GLUCOSE 103 11/02/2013 1040   GLUCOSE 99 02/17/2013 1120   BUN 13 02/09/2021 1131   BUN 15 07/25/2014 1100   BUN 9.3 11/02/2013 1040   CREATININE 0.58 02/09/2021 1131   CREATININE 0.74 07/25/2014 1100   CREATININE 0.7 11/02/2013 1040   CALCIUM 9.5 02/09/2021 1131   CALCIUM 8.6 07/25/2014 1100   CALCIUM 9.5 11/02/2013 1040   GFRNONAA >60 02/02/2021 0421   GFRNONAA >60 07/25/2014 1100   GFRAA >60 07/29/2016 0914   GFRAA >60 07/25/2014 1100   CrCl cannot be calculated (Patient's most recent lab result is older than the maximum 21 days allowed.).  COAG No results found for: INR, PROTIME  Radiology No results found.   Assessment/Plan 1. Varicose veins with inflammation Recommend:  The patient is complaining of varicose veins.    I have had a long discussion with the patient regarding  varicose veins and why they cause symptoms.  Patient will begin wearing graduated compression stockings on a daily basis, beginning first thing in the morning and removing them in the evening. The patient is instructed specifically not to sleep in the stockings.    The patient  will also begin using over-the-counter analgesics such as Motrin 600 mg po TID to help control the symptoms as needed.    In addition, behavioral modification including elevation during the day will be initiated, utilizing a recliner was recommended.  The patient is also instructed to continue exercising such as walking 4-5 times per week.  At this time the patient wishes to continue conservative therapy and is not interested in more invasive treatments such as laser ablation and sclerotherapy.  The Patient will follow up PRN if the symptoms worsen.   2. Essential hypertension Continue antihypertensive medications as already ordered, these medications have been reviewed and there are no changes at this time.   3. Pure  hypercholesterolemia Continue statin as ordered and reviewed, no changes at this time   4. Degenerative disc disease, lumbar Continue NSAID medications as already ordered, these medications have been reviewed  and there are no changes at this time.  Continued activity and therapy was stressed.     Hortencia Pilar, MD  07/30/2021 10:23 AM

## 2021-08-07 ENCOUNTER — Ambulatory Visit
Admission: RE | Admit: 2021-08-07 | Discharge: 2021-08-07 | Disposition: A | Discharge status: Home / Self Care | Payer: BC Managed Care – PPO | Source: Ambulatory Visit | Attending: Internal Medicine | Admitting: Internal Medicine

## 2021-08-07 ENCOUNTER — Other Ambulatory Visit: Payer: Self-pay

## 2021-08-07 DIAGNOSIS — Z1231 Encounter for screening mammogram for malignant neoplasm of breast: Secondary | ICD-10-CM | POA: Diagnosis not present

## 2021-08-08 ENCOUNTER — Inpatient Hospital Stay (HOSPITAL_BASED_OUTPATIENT_CLINIC_OR_DEPARTMENT_OTHER): Payer: BC Managed Care – PPO | Admitting: Oncology

## 2021-08-08 ENCOUNTER — Encounter: Payer: Self-pay | Admitting: Oncology

## 2021-08-08 ENCOUNTER — Inpatient Hospital Stay: Payer: BC Managed Care – PPO | Attending: Nurse Practitioner

## 2021-08-08 VITALS — BP 142/80 | HR 79 | Temp 95.0°F | Ht 63.0 in | Wt 156.0 lb

## 2021-08-08 DIAGNOSIS — M8589 Other specified disorders of bone density and structure, multiple sites: Secondary | ICD-10-CM

## 2021-08-08 DIAGNOSIS — Z79811 Long term (current) use of aromatase inhibitors: Secondary | ICD-10-CM | POA: Insufficient documentation

## 2021-08-08 DIAGNOSIS — Z170 Estrogen receptor positive status [ER+]: Secondary | ICD-10-CM | POA: Insufficient documentation

## 2021-08-08 DIAGNOSIS — C50911 Malignant neoplasm of unspecified site of right female breast: Secondary | ICD-10-CM | POA: Insufficient documentation

## 2021-08-08 DIAGNOSIS — Z171 Estrogen receptor negative status [ER-]: Secondary | ICD-10-CM | POA: Diagnosis not present

## 2021-08-08 DIAGNOSIS — C50812 Malignant neoplasm of overlapping sites of left female breast: Secondary | ICD-10-CM | POA: Diagnosis not present

## 2021-08-08 LAB — CBC WITH DIFFERENTIAL/PLATELET
Abs Immature Granulocytes: 0.03 10*3/uL (ref 0.00–0.07)
Basophils Absolute: 0 10*3/uL (ref 0.0–0.1)
Basophils Relative: 0 %
Eosinophils Absolute: 0.2 10*3/uL (ref 0.0–0.5)
Eosinophils Relative: 2 %
HCT: 43.3 % (ref 36.0–46.0)
Hemoglobin: 14.4 g/dL (ref 12.0–15.0)
Immature Granulocytes: 0 %
Lymphocytes Relative: 32 %
Lymphs Abs: 3.3 10*3/uL (ref 0.7–4.0)
MCH: 32.5 pg (ref 26.0–34.0)
MCHC: 33.3 g/dL (ref 30.0–36.0)
MCV: 97.7 fL (ref 80.0–100.0)
Monocytes Absolute: 0.9 10*3/uL (ref 0.1–1.0)
Monocytes Relative: 8 %
Neutro Abs: 5.9 10*3/uL (ref 1.7–7.7)
Neutrophils Relative %: 58 %
Platelets: 210 10*3/uL (ref 150–400)
RBC: 4.43 MIL/uL (ref 3.87–5.11)
RDW: 13 % (ref 11.5–15.5)
WBC: 10.2 10*3/uL (ref 4.0–10.5)
nRBC: 0 % (ref 0.0–0.2)

## 2021-08-08 LAB — COMPREHENSIVE METABOLIC PANEL
ALT: 20 U/L (ref 0–44)
AST: 19 U/L (ref 15–41)
Albumin: 3.8 g/dL (ref 3.5–5.0)
Alkaline Phosphatase: 58 U/L (ref 38–126)
Anion gap: 9 (ref 5–15)
BUN: 19 mg/dL (ref 6–20)
CO2: 27 mmol/L (ref 22–32)
Calcium: 9.1 mg/dL (ref 8.9–10.3)
Chloride: 101 mmol/L (ref 98–111)
Creatinine, Ser: 0.7 mg/dL (ref 0.44–1.00)
GFR, Estimated: >60 mL/min (ref >60)
Glucose, Bld: 104 mg/dL — ABNORMAL HIGH (ref 70–99)
Potassium: 4.6 mmol/L (ref 3.5–5.1)
Sodium: 137 mmol/L (ref 135–145)
Total Bilirubin: 0.8 mg/dL (ref 0.3–1.2)
Total Protein: 6.6 g/dL (ref 6.5–8.1)

## 2021-08-08 NOTE — Progress Notes (Signed)
Pt states she is having numbness and tingling in her hands due to carpal tunnel.

## 2021-08-08 NOTE — Progress Notes (Signed)
[place] OFFICE PROGRESS NOTE  Patient Care Team: Tower, Wynelle Fanny, MD as PCP - General  Cancer of breast [facility])   Staging form: Breast, AJCC 7th Edition     Clinical: Stage IA (T1c, N0, M0) - Unsigned    Oncology History Overview Note  # 2014- SYNCHRONOUS BIL BREAST CA [Dr.K. Humphrey Rolls; [place]]  # RIGHT-IMC; lumpec & ALND-T1cN0; TNBC s/p RT  # LEFT-IMC;STAGE II  T1bN1 ER/PR-Pos Her 2 Neu-NEG s/p Lumpec & RT  # s/p AC x4- Taxol/ gem-Carbo  # MARCH 2015- STARTED Arimidex   # BMD- 2018- osteopenia  # BRCA testing- NEG [as per pt]  # PN- 1-2; intolerant to Neurontin/Lyrica; s/p accupuncture  # DIAGNOSIS: Breast cancer synchronous #Right side stage I triple negative #Left side-stage II ER PR positive HER-2/neu negative  GOALS: Cure  CURRENT/MOST RECENT THERAPY: Anastrozole [x Ettended]    Carcinoma of overlapping sites of left breast in female, estrogen receptor positive ([place])    INTERVAL HISTORY:  Amy Leonard 60 y.o.  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  Denies any new lumps or bumps.  Appetite has improved since her ERCP with gallstones removed.  She was hospitalized back in June for abdominal pain.  Work-up included labs and imaging which showed a dilated common bile duct concerning for obstruction with stones.  Underwent successful ERCP with stent placement on 01/29/21 with cholecystectomy on 01/31/21.  Had stents removed earlier this month.  Insomnia and acid reflux have also improved.  She had a mammogram completed yesterday which was read as negative.  She has been compliant with her Arimidex.  She has not been taking her Caltrate due to inability to find her specific brand.  States her friend was able to find it [place] which she will order here in the next few days.   Review of Systems  Constitutional:  Positive for weight loss (Improved post cholecystectomy).     PAST MEDICAL HISTORY :  Past Medical History:  Diagnosis Date   Anxiety    Anxiety and depression    Barrett's esophageal ulceration    Basal ganglia infarction ([place]) 04/17/2012   Breast cancer ([place]) 2014   BILATERAL LUMPECTOMY   Carpal tunnel syndrome    Chronic back pain    spinal stenosis   Depression    Elevated WBC count    nl diff   Erosive gastritis    Folliculitis 12/26/9765   GERD (gastroesophageal reflux disease)    Glaucoma    ?   History of chemotherapy 2014   BREAST CA   HLD (hyperlipidemia)    HSV infection    recurrent (side and buttox)   Hypertension    Migraine    Personal history of chemotherapy 2014   bilateral breast ca   Personal history of radiation therapy 2014   bilateral breast ca   Radiation 2014   BREAST CA   Tobacco abuse    Wears dentures    top    PAST SURGICAL HISTORY :   Past Surgical History:  Procedure Laterality Date   BREAST BIOPSY Bilateral 2014   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   BREAST LUMPECTOMY Bilateral 2014   bilateral breast ca   BTL     CHOLECYSTECTOMY  01/2021   COLONOSCOPY     epidural steroid injection  2008   ERCP N/A 01/29/2021   Procedure: ENDOSCOPIC RETROGRADE CHOLANGIOPANCREATOGRAPHY (ERCP);  Surgeon: Lucilla Lame, MD;  Location: [facility] ENDOSCOPY;  Service: Endoscopy;  Laterality: N/A;   ERCP N/A 04/03/2021   Procedure: ENDOSCOPIC RETROGRADE CHOLANGIOPANCREATOGRAPHY (ERCP);  Surgeon: Lucilla Lame, MD;  Location: [facility] ENDOSCOPY;  Service: Endoscopy;  Laterality: N/A;   LUMBAR FUSION  03/2016   L4-5   PARTIAL MASTECTOMY WITH NEEDLE LOCALIZATION AND AXILLARY SENTINEL LYMPH NODE BX Bilateral 11/23/2012   Procedure: PARTIAL MASTECTOMY WITH NEEDLE LOCALIZATION AND AXILLARY SENTINEL LYMPH NODE BX;  Surgeon: Adin Hector, MD;  Location: [place];  Service: General;  Laterality: Bilateral;   PORT-A-CATH REMOVAL Right 08/25/2013   Procedure: REMOVAL PORT-A-CATH;  Surgeon:  Adin Hector, MD;  Location: [place];  Service: General;  Laterality: Right;   PORTACATH PLACEMENT Right 11/23/2012   Procedure: INSERTION PORT-A-CATH;  Surgeon: Adin Hector, MD;  Location: [place];  Service: General;  Laterality: Right;   TUBAL LIGATION     UPPER GI ENDOSCOPY      FAMILY HISTORY :   Family History  Problem Relation Age of Onset   Stroke Maternal Grandmother    Diabetes Maternal Grandmother    Diabetes Mother    Hypertension Mother    Leukemia Mother    Obesity Mother    Depression Sister    Bipolar disorder Son        Bipolar / oppositional defiant   Alcohol abuse Sister    Drug abuse Sister    Alcohol abuse Sister    Drug abuse Sister    Alcohol abuse Brother    Drug abuse Brother    Pancreatic cancer Brother    Colon cancer Neg Hx    Breast cancer Neg Hx     SOCIAL HISTORY:   Social History   Tobacco Use   Smoking status: Every Day    Packs/day: 0.50    Years: 33.00    Pack years: 16.50    Types: Cigarettes   Smokeless tobacco: Never  Vaping Use   Vaping Use: Never used  Substance Use Topics   Alcohol use: Yes    Alcohol/week: 0.0 standard drinks    Comment: beer rarely   Drug use: No    ALLERGIES:  is allergic to gabapentin, lyrica [pregabalin], voltaren [diclofenac sodium], bupropion hcl, chlorhexidine gluconate, hydrocod polst-cpm polst er, lisinopril, and naproxen sodium.  MEDICATIONS:  Current Outpatient Medications  Medication Sig Dispense Refill   anastrozole (ARIMIDEX) 1 MG tablet TAKE 1 TABLET DAILY 90 tablet 0   aspirin 81 MG chewable tablet Chew 1 tablet (81 mg total) by mouth daily. 90 tablet 3   atorvastatin (LIPITOR) 10 MG tablet Take 1 tablet (10 mg total) by mouth daily. 90 tablet 3   b complex vitamins tablet Take 2 tablets by mouth daily.     calcium carbonate (TUMS - DOSED IN MG ELEMENTAL CALCIUM) 500 MG chewable tablet Chew by mouth as directed.     omeprazole (PRILOSEC) 40  MG capsule Take 1 capsule (40 mg total) by mouth daily. 90 capsule 0   Calcium Carbonate-Vitamin D (CALCIUM + D PO) Take 1 tablet by mouth daily. (Patient not taking: Reported on 08/08/2021)     No current facility-administered medications for this visit.    PHYSICAL EXAMINATION: ECOG PERFORMANCE STATUS: 0 - Asymptomatic  BP (!) 142/80 (BP Location: Left Arm, Patient Position: Sitting, Cuff Size: Normal)    Pulse 79    Temp (!) 95 F (35 C) (Tympanic)    Ht 5' 3" (1.6 m)  Wt 156 lb (70.8 kg)    SpO2 99%    BMI 27.63 kg/m   Filed Weights   08/08/21 1012  Weight: 156 lb (70.8 kg)    Physical Exam Constitutional:      Appearance: Normal appearance.  HENT:     Head: Normocephalic and atraumatic.  Eyes:     Pupils: Pupils are equal, round, and reactive to light.  Cardiovascular:     Rate and Rhythm: Normal rate and regular rhythm.     Heart sounds: Normal heart sounds. No murmur heard. Pulmonary:     Effort: Pulmonary effort is normal.     Breath sounds: Normal breath sounds. No wheezing.  Abdominal:     General: Bowel sounds are normal. There is no distension.     Palpations: Abdomen is soft.     Tenderness: There is no abdominal tenderness.  Musculoskeletal:        General: Normal range of motion.     Cervical back: Normal range of motion.  Skin:    General: Skin is warm and dry.     Findings: No rash.  Neurological:     Mental Status: She is alert and oriented to person, place, and time.     Gait: Gait is intact.  Psychiatric:        Mood and Affect: Mood and affect normal.        Cognition and Memory: Memory normal.        Judgment: Judgment normal.     LABORATORY DATA:  I have reviewed the data as listed    Component Value Date/Time   NA 137 02/09/2021 1131   NA 140 07/25/2014 1100   NA 140 11/02/2013 1040   K 4.1 02/09/2021 1131   K 4.1 07/25/2014 1100   K 3.9 11/02/2013 1040   CL 102 02/09/2021 1131   CL 105 07/25/2014 1100   CL 109 (H) 02/17/2013  1120   CO2 25 02/09/2021 1131   CO2 30 07/25/2014 1100   CO2 24 11/02/2013 1040   GLUCOSE 100 (H) 02/09/2021 1131   GLUCOSE 97 07/25/2014 1100   GLUCOSE 103 11/02/2013 1040   GLUCOSE 99 02/17/2013 1120   BUN 13 02/09/2021 1131   BUN 15 07/25/2014 1100   BUN 9.3 11/02/2013 1040   CREATININE 0.58 02/09/2021 1131   CREATININE 0.74 07/25/2014 1100   CREATININE 0.7 11/02/2013 1040   CALCIUM 9.5 02/09/2021 1131   CALCIUM 8.6 07/25/2014 1100   CALCIUM 9.5 11/02/2013 1040   PROT 6.7 02/09/2021 1131   PROT 6.4 07/25/2014 1100   PROT 6.2 (L) 11/02/2013 1040   ALBUMIN 3.9 02/09/2021 1131   ALBUMIN 3.5 07/25/2014 1100   ALBUMIN 3.7 11/02/2013 1040   AST 14 02/09/2021 1131   AST 16 07/25/2014 1100   AST 20 11/02/2013 1040   ALT 18 02/09/2021 1131   ALT 24 07/25/2014 1100   ALT 22 11/02/2013 1040   ALKPHOS 165 (H) 02/09/2021 1131   ALKPHOS 75 07/25/2014 1100   ALKPHOS 84 11/02/2013 1040   BILITOT 0.8 02/09/2021 1131   BILITOT 0.3 07/25/2014 1100   BILITOT 0.48 11/02/2013 1040   GFRNONAA >60 02/02/2021 0421   GFRNONAA >60 07/25/2014 1100   GFRAA >60 07/29/2016 0914   GFRAA >60 07/25/2014 1100    No results found for: SPEP, UPEP  Lab Results  Component Value Date   WBC 10.2 08/08/2021   NEUTROABS 5.9 08/08/2021   HGB 14.4 08/08/2021   HCT 43.3 08/08/2021  MCV 97.7 08/08/2021   PLT 210 08/08/2021      Chemistry      Component Value Date/Time   NA 137 02/09/2021 1131   NA 140 07/25/2014 1100   NA 140 11/02/2013 1040   K 4.1 02/09/2021 1131   K 4.1 07/25/2014 1100   K 3.9 11/02/2013 1040   CL 102 02/09/2021 1131   CL 105 07/25/2014 1100   CL 109 (H) 02/17/2013 1120   CO2 25 02/09/2021 1131   CO2 30 07/25/2014 1100   CO2 24 11/02/2013 1040   BUN 13 02/09/2021 1131   BUN 15 07/25/2014 1100   BUN 9.3 11/02/2013 1040   CREATININE 0.58 02/09/2021 1131   CREATININE 0.74 07/25/2014 1100   CREATININE 0.7 11/02/2013 1040      Component Value Date/Time   CALCIUM 9.5  02/09/2021 1131   CALCIUM 8.6 07/25/2014 1100   CALCIUM 9.5 11/02/2013 1040   ALKPHOS 165 (H) 02/09/2021 1131   ALKPHOS 75 07/25/2014 1100   ALKPHOS 84 11/02/2013 1040   AST 14 02/09/2021 1131   AST 16 07/25/2014 1100   AST 20 11/02/2013 1040   ALT 18 02/09/2021 1131   ALT 24 07/25/2014 1100   ALT 22 11/02/2013 1040   BILITOT 0.8 02/09/2021 1131   BILITOT 0.3 07/25/2014 1100   BILITOT 0.48 11/02/2013 1040       RADIOGRAPHIC STUDIES: I have personally reviewed the radiological images as listed and agreed with the findings in the report. MM 3D SCREEN BREAST BILATERAL  Result Date: 08/08/2021 CLINICAL DATA:  Screening. EXAM: DIGITAL SCREENING BILATERAL MAMMOGRAM WITH TOMOSYNTHESIS AND CAD TECHNIQUE: Bilateral screening digital craniocaudal and mediolateral oblique mammograms were obtained. Bilateral screening digital breast tomosynthesis was performed. The images were evaluated with computer-aided detection. COMPARISON:  Previous exam(s). ACR Breast Density Category b: There are scattered areas of fibroglandular density. FINDINGS: There are no findings suspicious for malignancy. IMPRESSION: No mammographic evidence of malignancy. A result letter of this screening mammogram will be mailed directly to the patient. RECOMMENDATION: Screening mammogram in one year. (Code:SM-B-01Y) BI-RADS CATEGORY  1: Negative. Electronically Signed   By: Lillia Mountain M.D.   On: 08/08/2021 08:52     ASSESSMENT & PLAN:  Carcinoma of overlapping sites of left breast in female, estrogen receptor positive ([place]) Has bilateral synchronous breast cancer-early stage.  Currently on Arimidex which she started in 2015.  Tolerating well without any side effects.  Plan is for 10 years of therapy.  Mammogram from yesterday was negative.  Repeat in 1 year.  Osteopenia- Previously on Caltrate Gummies.  Unable to find at drugstore recently.  We will be ordering off of [place] in the next few days.  Last bone density was in  December 2020.  We will repeat before the end of the year.  Patient agreeable.  Weight Loss- Secondary to gallstones.  Had ERCP back in June 2022 with stent removal earlier this month.  Acid reflux has improved.  Weight is stable.  Disposition- Mammogram prior to visit in 12 months. Bone density within the next few weeks. Return to clinic in 1 year after mammogram with lab work and to see Dr. Rogue Bussing.   I spent 20 minutes dedicated to the care of this patient (face-to-face and non-face-to-face) on the date of the encounter to include what is described in the assessment and plan.  No problem-specific Assessment & Plan notes found for this encounter.   No orders of the defined types were placed in this encounter.  All questions were answered. The patient knows to call the clinic with any problems, questions or concerns.     Jacquelin Hawking, NP 08/08/2021 10:20 AM

## 2021-08-14 ENCOUNTER — Other Ambulatory Visit: Payer: Self-pay

## 2021-08-14 ENCOUNTER — Ambulatory Visit
Admission: RE | Admit: 2021-08-14 | Discharge: 2021-08-14 | Disposition: A | Discharge status: Home / Self Care | Payer: BC Managed Care – PPO | Source: Ambulatory Visit | Attending: Oncology | Admitting: Oncology

## 2021-08-14 DIAGNOSIS — M8589 Other specified disorders of bone density and structure, multiple sites: Secondary | ICD-10-CM | POA: Diagnosis not present

## 2021-08-14 DIAGNOSIS — Z780 Asymptomatic menopausal state: Secondary | ICD-10-CM | POA: Diagnosis not present

## 2021-08-14 DIAGNOSIS — M85832 Other specified disorders of bone density and structure, left forearm: Secondary | ICD-10-CM | POA: Diagnosis not present

## 2021-08-14 DIAGNOSIS — M85851 Other specified disorders of bone density and structure, right thigh: Secondary | ICD-10-CM | POA: Diagnosis not present

## 2021-08-21 ENCOUNTER — Other Ambulatory Visit: Payer: Self-pay

## 2021-08-21 ENCOUNTER — Ambulatory Visit (INDEPENDENT_AMBULATORY_CARE_PROVIDER_SITE_OTHER): Payer: BC Managed Care – PPO | Admitting: Podiatry

## 2021-08-21 DIAGNOSIS — Q667 Congenital pes cavus, unspecified foot: Secondary | ICD-10-CM | POA: Diagnosis not present

## 2021-08-21 DIAGNOSIS — Q828 Other specified congenital malformations of skin: Secondary | ICD-10-CM | POA: Diagnosis not present

## 2021-08-23 NOTE — Progress Notes (Signed)
Subjective:  Patient ID: Amy Leonard, female    DOB: 05-07-1961,  MRN: [id number]  Chief Complaint  Patient presents with   Callouses    60 y.o. female presents with the above complaint.  Patient presents for follow-up of pes cavus foot structure with plantarflexed submetatarsal 5 bilaterally.  She states is doing a little bit better.  She is here to pick up orthotics.  She would like to discuss other treatment options.  She denies any other acute complaints.   Review of Systems: Negative except as noted in the HPI. Denies N/V/F/Ch.  Past Medical History:  Diagnosis Date   Anxiety    Anxiety and depression    Barrett's esophageal ulceration    Basal ganglia infarction ([place]) 04/17/2012   Breast cancer ([place]) 2014   BILATERAL LUMPECTOMY   Carpal tunnel syndrome    Chronic back pain    spinal stenosis   Depression    Elevated WBC count    nl diff   Erosive gastritis    Folliculitis 09/27/252   GERD (gastroesophageal reflux disease)    Glaucoma    ?   History of chemotherapy 2014   BREAST CA   HLD (hyperlipidemia)    HSV infection    recurrent (side and buttox)   Hypertension    Migraine    Personal history of chemotherapy 2014   bilateral breast ca   Personal history of radiation therapy 2014   bilateral breast ca   Radiation 2014   BREAST CA   Tobacco abuse    Wears dentures    top    Current Outpatient Medications:    anastrozole (ARIMIDEX) 1 MG tablet, TAKE 1 TABLET DAILY, Disp: 90 tablet, Rfl: 0   aspirin 81 MG chewable tablet, Chew 1 tablet (81 mg total) by mouth daily., Disp: 90 tablet, Rfl: 3   atorvastatin (LIPITOR) 10 MG tablet, Take 1 tablet (10 mg total) by mouth daily., Disp: 90 tablet, Rfl: 3   b complex vitamins tablet, Take 2 tablets by mouth daily., Disp: , Rfl:    calcium carbonate (TUMS - DOSED IN MG ELEMENTAL CALCIUM) 500 MG chewable tablet, Chew by mouth as directed., Disp: , Rfl:    Calcium Carbonate-Vitamin D (CALCIUM + D PO), Take 1  tablet by mouth daily. (Patient not taking: Reported on 08/08/2021), Disp: , Rfl:    omeprazole (PRILOSEC) 40 MG capsule, Take 1 capsule (40 mg total) by mouth daily., Disp: 90 capsule, Rfl: 0  Social History   Tobacco Use  Smoking Status Every Day   Packs/day: 0.50   Years: 33.00   Pack years: 16.50   Types: Cigarettes  Smokeless Tobacco Never    Allergies  Allergen Reactions   Gabapentin Swelling   Lyrica [Pregabalin] Other (See Comments)    sedated   Voltaren [Diclofenac Sodium] Nausea And Vomiting   Bupropion Hcl Nausea Only and Other (See Comments)     GI, sleepy   Chlorhexidine Gluconate Itching and Rash   Hydrocod Polst-Cpm Polst Er Nausea And Vomiting    vomiting   Lisinopril Other (See Comments)    Leg cramps     Naproxen Sodium Other (See Comments)    does not tolerate   Objective:  There were no vitals filed for this visit. There is no height or weight on file to calculate BMI. Constitutional Well developed. Well nourished.  Vascular Dorsalis pedis pulses palpable bilaterally. Posterior tibial pulses palpable bilaterally. Capillary refill normal to all digits.  No cyanosis or clubbing  noted. Pedal hair growth normal.  Neurologic Normal speech. Oriented to person, place, and time. Epicritic sensation to light touch grossly present bilaterally.  Dermatologic Slowly improving hyperkeratotic lesion with central nucleated core noted to bilateral submetatarsal 5.  Pain on palpation.  Gait examination shows pes cavus foot structure with excessive stress to the heel submit 5 and 7 at 1 bilaterally.  Orthopedic: Normal joint ROM without pain or crepitus bilaterally. No visible deformities. No bony tenderness.   Radiographs: None Assessment:   No diagnosis found.  Plan:  Patient was evaluated and treated and all questions answered.  I explained to the patient pes cavus deformity with underlying submetatarsal 5 porokeratotic lesion -Clinically patient still  has some residual pain.  At this time I believe she would benefit from shoe gear modification with orthotics.  I have asked him to give that a chance.  There is no improvement we will discuss floating osteotomy.  She states understanding -Orthotics were dispensed and is functioning well. -If there is no improvement we will discuss floating osteotomy during next clinic visit  No follow-ups on file.

## 2021-08-27 ENCOUNTER — Other Ambulatory Visit: Payer: Self-pay | Admitting: Internal Medicine

## 2021-10-27 ENCOUNTER — Ambulatory Visit
Admission: EM | Admit: 2021-10-27 | Discharge: 2021-10-27 | Disposition: A | Discharge status: Home / Self Care | Payer: BC Managed Care – PPO | Attending: Family Medicine | Admitting: Family Medicine

## 2021-10-27 ENCOUNTER — Encounter: Payer: Self-pay | Admitting: Emergency Medicine

## 2021-10-27 ENCOUNTER — Other Ambulatory Visit: Payer: Self-pay

## 2021-10-27 DIAGNOSIS — R059 Cough, unspecified: Secondary | ICD-10-CM | POA: Diagnosis not present

## 2021-10-27 DIAGNOSIS — J029 Acute pharyngitis, unspecified: Secondary | ICD-10-CM | POA: Diagnosis not present

## 2021-10-27 LAB — POCT RAPID STREP A (OFFICE): Rapid Strep A Screen: NEGATIVE

## 2021-10-27 MED ORDER — PROMETHAZINE-DM 6.25-15 MG/5ML PO SYRP: 5.0000 mL | ORAL_SOLUTION | Freq: Three times a day (TID) | ORAL | 0 refills | Status: DC | PRN
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
## 2021-10-27 NOTE — ED Triage Notes (Signed)
Pt presents with cough and ST since yesterday. Pts daughter in law tested positive for strep yesterday.  ?

## 2021-10-27 NOTE — Discharge Instructions (Signed)
Your throat culture is pending.  If the throat culture shows any infection we will send medication over to the pharmacy and contact you by phone ? ?

## 2021-10-30 LAB — CULTURE, GROUP A STREP (THRC)

## 2021-11-14 ENCOUNTER — Telehealth: Payer: Self-pay

## 2021-11-14 NOTE — Telephone Encounter (Signed)
Attempted to call patient to inform her of the bone density results and discuss options as MD recommends.  No answer and not able to leave a message due to voicemail full.  ? ? ?

## 2021-11-14 NOTE — Telephone Encounter (Signed)
-----   Message from Cammie Sickle, MD sent at 11/08/2021  3:18 PM EDT ----- ?Regarding: bmd-follow up ?Jenny-looks like you ordered BMD-on this patient that showed worsening osteopenia [compared to 2020].  First step would be making sure she is compliant with calcium plus vitamin D.  If she is interested she could be considered for reclast/prolia.  Please check with the patient her preference. ? ?Thanks ?GB ? ? ?----- Message ----- ?From: Abner Greenspan, MD ?Sent: 08/21/2021   8:01 AM EDT ?To: Cammie Sickle, MD ? ?FYI ? ?

## 2021-11-16 ENCOUNTER — Ambulatory Visit
Admission: EM | Admit: 2021-11-16 | Discharge: 2021-11-16 | Disposition: A | Discharge status: Home / Self Care | Payer: BC Managed Care – PPO | Attending: Emergency Medicine | Admitting: Emergency Medicine

## 2021-11-16 ENCOUNTER — Encounter: Payer: Self-pay | Admitting: Emergency Medicine

## 2021-11-16 ENCOUNTER — Other Ambulatory Visit: Payer: Self-pay

## 2021-11-16 DIAGNOSIS — S61032A Puncture wound without foreign body of left thumb without damage to nail, initial encounter: Secondary | ICD-10-CM

## 2021-11-16 DIAGNOSIS — L03114 Cellulitis of left upper limb: Secondary | ICD-10-CM | POA: Diagnosis not present

## 2021-11-16 DIAGNOSIS — Z23 Encounter for immunization: Secondary | ICD-10-CM

## 2021-11-16 MED ORDER — CEPHALEXIN 500 MG PO CAPS: 500.0000 mg | ORAL_CAPSULE | Freq: Four times a day (QID) | ORAL | 0 refills | Status: DC

## 2021-11-16 MED ORDER — TETANUS-DIPHTH-ACELL PERTUSSIS 5-2.5-18.5 LF-MCG/0.5 IM SUSY
0.5000 mL | PREFILLED_SYRINGE | Freq: Once | INTRAMUSCULAR | Status: AC
  Administered 2021-11-16: 0.5 mL via INTRAMUSCULAR

## 2021-11-16 NOTE — ED Triage Notes (Signed)
Pt presents with left  thumb pain and swelling after getting stick by a small screwdriver x 2 days.  ?

## 2021-11-16 NOTE — Discharge Instructions (Addendum)
Take the antibiotic as directed.  See the attached information.  Have your wound rechecked by your primary care provider or return here in 2-3 days.  Go to the emergency department if your symptoms worsen.   ? ?
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
## 2021-11-19 ENCOUNTER — Encounter: Payer: Self-pay | Admitting: Family Medicine

## 2021-11-19 ENCOUNTER — Other Ambulatory Visit: Payer: Self-pay

## 2021-11-19 ENCOUNTER — Ambulatory Visit: Payer: BC Managed Care – PPO | Admitting: Family Medicine

## 2021-11-19 VITALS — BP 140/80 | HR 108 | Temp 97.9°F | Ht 63.0 in | Wt 156.5 lb

## 2021-11-19 DIAGNOSIS — E7800 Pure hypercholesterolemia, unspecified: Secondary | ICD-10-CM | POA: Diagnosis not present

## 2021-11-19 DIAGNOSIS — S61032D Puncture wound without foreign body of left thumb without damage to nail, subsequent encounter: Secondary | ICD-10-CM | POA: Diagnosis not present

## 2021-11-19 DIAGNOSIS — I10 Essential (primary) hypertension: Secondary | ICD-10-CM | POA: Diagnosis not present

## 2021-11-19 DIAGNOSIS — S61032A Puncture wound without foreign body of left thumb without damage to nail, initial encounter: Secondary | ICD-10-CM | POA: Insufficient documentation

## 2021-11-19 MED ORDER — ATORVASTATIN CALCIUM 10 MG PO TABS: 10.0000 mg | ORAL_TABLET | Freq: Every day | ORAL | 3 refills | Status: DC

## 2021-11-19 NOTE — Assessment & Plan Note (Signed)
Out of atorvastatin for 2 wk ?Disc goals for lipids and reasons to control them ?Rev last labs with pt ?Rev low sat fat diet in detail ?Sent to pharmacy  ?Will have PE next week /(# will be high but understood) ?
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
## 2021-11-19 NOTE — Assessment & Plan Note (Addendum)
Small puncture wound of L prox/lat thumb  ?Much improved with keflex so far (day 3)  ?Reviewed UC notes and pictures  ?Will finish it  ?Ice/heat/elevation  ?Watch for return of redness/swelling or pain  ?ER precautions rev  ? ?

## 2021-11-19 NOTE — Telephone Encounter (Signed)
Attempted to call patient again but not able to leave voicemail.  Patient has a complete physical scheduled with PCP on 11/27/21.  PCP has attempted to notify patient of bone density results as well.  Will send letter requesting patient to call the [facility] for results.   ?

## 2021-11-19 NOTE — Patient Instructions (Signed)
The thumb looks better  ?Elevate, use heat and ice  ? ?Keep clean with soap and water ? ?If you develop more swelling or redness or pain please let us know asap  ? ?I sent in your cholesterol medicine  ? ?Take care of yourself  ?

## 2021-11-19 NOTE — Assessment & Plan Note (Signed)
bp and pusle were up after drive today  ?BP: 140/80  ? ?Return next week ?

## 2021-11-23 ENCOUNTER — Telehealth: Payer: Self-pay | Admitting: Family Medicine

## 2021-11-23 ENCOUNTER — Other Ambulatory Visit (INDEPENDENT_AMBULATORY_CARE_PROVIDER_SITE_OTHER): Payer: BC Managed Care – PPO

## 2021-11-23 DIAGNOSIS — E7800 Pure hypercholesterolemia, unspecified: Secondary | ICD-10-CM

## 2021-11-23 DIAGNOSIS — Z0000 Encounter for general adult medical examination without abnormal findings: Secondary | ICD-10-CM

## 2021-11-23 DIAGNOSIS — R7303 Prediabetes: Secondary | ICD-10-CM

## 2021-11-23 DIAGNOSIS — I10 Essential (primary) hypertension: Secondary | ICD-10-CM

## 2021-11-23 LAB — LIPID PANEL
Cholesterol: 159 mg/dL (ref 0–200)
HDL: 48.2 mg/dL (ref >39.00)
LDL Cholesterol: 97 mg/dL (ref 0–99)
NonHDL: 111.26
Total CHOL/HDL Ratio: 3
Triglycerides: 69 mg/dL (ref 0.0–149.0)
VLDL: 13.8 mg/dL (ref 0.0–40.0)

## 2021-11-23 LAB — TSH: TSH: 2.59 u[IU]/mL (ref 0.35–5.50)

## 2021-11-23 LAB — COMPREHENSIVE METABOLIC PANEL
ALT: 16 U/L (ref 0–35)
AST: 16 U/L (ref 0–37)
Albumin: 4.1 g/dL (ref 3.5–5.2)
Alkaline Phosphatase: 59 U/L (ref 39–117)
BUN: 15 mg/dL (ref 6–23)
CO2: 31 mEq/L (ref 19–32)
Calcium: 9.2 mg/dL (ref 8.4–10.5)
Chloride: 105 mEq/L (ref 96–112)
Creatinine, Ser: 0.56 mg/dL (ref 0.40–1.20)
GFR: 98.69 mL/min (ref >60.00)
Glucose, Bld: 102 mg/dL — ABNORMAL HIGH (ref 70–99)
Potassium: 4 mEq/L (ref 3.5–5.1)
Sodium: 141 mEq/L (ref 135–145)
Total Bilirubin: 0.6 mg/dL (ref 0.2–1.2)
Total Protein: 6.1 g/dL (ref 6.0–8.3)

## 2021-11-23 LAB — CBC WITH DIFFERENTIAL/PLATELET
Basophils Absolute: 0 10*3/uL (ref 0.0–0.1)
Basophils Relative: 0.4 % (ref 0.0–3.0)
Eosinophils Absolute: 0.1 10*3/uL (ref 0.0–0.7)
Eosinophils Relative: 1.3 % (ref 0.0–5.0)
HCT: 42.8 % (ref 36.0–46.0)
Hemoglobin: 14.4 g/dL (ref 12.0–15.0)
Lymphocytes Relative: 30.4 % (ref 12.0–46.0)
Lymphs Abs: 2.5 10*3/uL (ref 0.7–4.0)
MCHC: 33.7 g/dL (ref 30.0–36.0)
MCV: 97.8 fl (ref 78.0–100.0)
Monocytes Absolute: 0.7 10*3/uL (ref 0.1–1.0)
Monocytes Relative: 8.5 % (ref 3.0–12.0)
Neutro Abs: 4.9 10*3/uL (ref 1.4–7.7)
Neutrophils Relative %: 59.4 % (ref 43.0–77.0)
Platelets: 223 10*3/uL (ref 150.0–400.0)
RBC: 4.37 Mil/uL (ref 3.87–5.11)
RDW: 13.7 % (ref 11.5–15.5)
WBC: 8.3 10*3/uL (ref 4.0–10.5)

## 2021-11-23 LAB — HEMOGLOBIN A1C: Hgb A1c MFr Bld: 5.9 % (ref 4.6–6.5)

## 2021-11-23 NOTE — Telephone Encounter (Signed)
Thank you ! Was on the road ?

## 2021-11-23 NOTE — Telephone Encounter (Signed)
Looks like you did them already, sorry about that  ?

## 2021-11-23 NOTE — Telephone Encounter (Signed)
-----   Message from Ellamae Sia sent at 11/23/2021  7:51 AM EDT ----- ?Regarding: need lab orders for today ?Patient is scheduled for CPX labs, please order future labs, Thanks , Terri ? ? ?

## 2021-11-25 ENCOUNTER — Other Ambulatory Visit: Payer: Self-pay | Admitting: Internal Medicine

## 2021-11-27 ENCOUNTER — Ambulatory Visit (INDEPENDENT_AMBULATORY_CARE_PROVIDER_SITE_OTHER): Payer: BC Managed Care – PPO | Admitting: Family Medicine

## 2021-11-27 ENCOUNTER — Encounter: Payer: Self-pay | Admitting: Family Medicine

## 2021-11-27 VITALS — BP 146/84 | HR 78 | Temp 98.0°F | Ht 62.0 in | Wt 154.0 lb

## 2021-11-27 DIAGNOSIS — G620 Drug-induced polyneuropathy: Secondary | ICD-10-CM | POA: Diagnosis not present

## 2021-11-27 DIAGNOSIS — J410 Simple chronic bronchitis: Secondary | ICD-10-CM | POA: Insufficient documentation

## 2021-11-27 DIAGNOSIS — M8589 Other specified disorders of bone density and structure, multiple sites: Secondary | ICD-10-CM

## 2021-11-27 DIAGNOSIS — R7303 Prediabetes: Secondary | ICD-10-CM

## 2021-11-27 DIAGNOSIS — I10 Essential (primary) hypertension: Secondary | ICD-10-CM | POA: Diagnosis not present

## 2021-11-27 DIAGNOSIS — Z0000 Encounter for general adult medical examination without abnormal findings: Secondary | ICD-10-CM | POA: Diagnosis not present

## 2021-11-27 DIAGNOSIS — F17200 Nicotine dependence, unspecified, uncomplicated: Secondary | ICD-10-CM

## 2021-11-27 DIAGNOSIS — E7800 Pure hypercholesterolemia, unspecified: Secondary | ICD-10-CM

## 2021-11-27 MED ORDER — LOSARTAN POTASSIUM 25 MG PO TABS: 25.0000 mg | ORAL_TABLET | Freq: Every day | ORAL | 3 refills | Status: DC

## 2021-11-27 MED ORDER — BENZONATATE 200 MG PO CAPS: 200.0000 mg | ORAL_CAPSULE | Freq: Two times a day (BID) | ORAL | 1 refills | Status: DC | PRN

## 2021-11-27 NOTE — Assessment & Plan Note (Signed)
Patient is not ready to quit smoking yet unfortunately ?Given prescription for Tessalon Perles to try 200 mg 3 times daily as needed swallow whole ?Consider pulmonary referral if this worsens or fails to improve ?Encouraged her to discuss lung cancer screening with her oncology ?

## 2021-11-27 NOTE — Assessment & Plan Note (Signed)
bp in fair control at this time  ?BP Readings from Last 1 Encounters:  ?11/27/21 (!) 146/84  ? ?This has been elevated the last few visits ?Plan to start losartan 25 mg daily which she tolerates well in the past ?Follow-up in 1 month ?Most recent labs reviewed  ?Disc lifstyle change with low sodium diet and exercise  ? ?

## 2021-11-27 NOTE — Assessment & Plan Note (Signed)
Disc in detail risks of smoking and possible outcomes including copd, vascular/ heart disease, cancer , respiratory and sinus infections as well as bone loss which she has ?Pt voices understanding ?She is not ready to quit yet unfortunately, but has patches at home ?Encouraged her strongly to discuss CAT scan screening for lung cancer with her oncologist ?

## 2021-11-27 NOTE — Assessment & Plan Note (Signed)
No clinical changes or medications at this time ?

## 2021-11-27 NOTE — Progress Notes (Signed)
? ?Subjective:  ? ? Patient ID: Amy Leonard, female    DOB: Mar 07, 1961, 61 y.o.   MRN: [id number] ? ?HPI ?Here for health maintenance exam and to review chronic medical problems   ? ?Wt Readings from Last 3 Encounters:  ?11/27/21 154 lb (69.9 kg)  ?11/19/21 156 lb 8 oz (71 kg)  ?08/08/21 156 lb (70.8 kg)  ? ?28.17 kg/m? ? ?Taking care of herself  ?Working a lot /is worn out  ?Really slow and tired  ?Has to wait on her family and clean up after them  ?Is frustrated  ? ? ?Smoking status :  ?Still smoking 1 ppd ?Some days she is more ready to quit than others ?Has patches to start when ready  ?Tried to start lung cancer screening- but not able to touch base (through oncology)  ? ? ? ?Zoster status-had both shingrix vaccine  ?Covid immunized  ?Tdap 10/2021 ?Did not get flu shot this year  ?Still fighting a cough from allergies  ? ? ? ?Mammogram  07/2021  ?Self breast exam: nothing new  ? ?Gyn care  ?Pap 2019 -nl  ?No new partner  ?No gyn problems  ? ? ?HTN ?bp is stable today  ?No cp or palpitations or headaches or edema  ?No side effects to medicines  ?BP Readings from Last 3 Encounters:  ?11/27/21 (!) 146/84  ?11/19/21 140/80  ?11/16/21 (!) 153/90  ? No medications currently  ?Intolerance to ace in the past/leg cramps  ?Losartan in the past and metoprolol ? ? ? ?Osteopenia ?Dexa 07/2021  ?Decrease in bone density  ?No falls or fractures ?Anastrozole - may get off of this year  ?Takes ca and vit D  ?Exercise- gets over 10,000 steps per day at work   (dealing with some foot pain and sees podiatry) ? ? ? ?Hyperlipidemia ?Lab Results  ?Component Value Date  ? CHOL 159 11/23/2021  ? CHOL 150 09/22/2020  ? CHOL 123 04/30/2019  ? ?Lab Results  ?Component Value Date  ? HDL 48.20 11/23/2021  ? HDL 47.80 09/22/2020  ? HDL 46.80 04/30/2019  ? ?Lab Results  ?Component Value Date  ? [place] 97 11/23/2021  ? [place] 84 09/22/2020  ? [place] 63 04/30/2019  ? ?Lab Results  ?Component Value Date  ? TRIG 69.0 11/23/2021  ? TRIG 94.0  09/22/2020  ? TRIG 68.0 04/30/2019  ? ?Lab Results  ?Component Value Date  ? CHOLHDL 3 11/23/2021  ? CHOLHDL 3 09/22/2020  ? CHOLHDL 3 04/30/2019  ? ?Lab Results  ?Component Value Date  ? LDLDIRECT 130.3 01/26/2010  ?Was out of atorvastatin for 2 weeks  ?Takes 10 mg daily now  ? ?Other labs  ?Lab Results  ?Component Value Date  ? CREATININE 0.56 11/23/2021  ? BUN 15 11/23/2021  ? NA 141 11/23/2021  ? K 4.0 11/23/2021  ? CL 105 11/23/2021  ? CO2 31 11/23/2021  ? ?Lab Results  ?Component Value Date  ? ALT 16 11/23/2021  ? AST 16 11/23/2021  ? ALKPHOS 59 11/23/2021  ? BILITOT 0.6 11/23/2021  ? ?Lab Results  ?Component Value Date  ? WBC 8.3 11/23/2021  ? HGB 14.4 11/23/2021  ? HCT 42.8 11/23/2021  ? MCV 97.8 11/23/2021  ? PLT 223.0 11/23/2021  ? ?Lab Results  ?Component Value Date  ? TSH 2.59 11/23/2021  ?  ?Lab Results  ?Component Value Date  ? HGBA1C 5.9 11/23/2021  ? ? ?Review of Systems  ?Constitutional:  Positive for fatigue. Negative for activity  change, appetite change, fever and unexpected weight change.  ?HENT:  Negative for congestion, ear pain, rhinorrhea, sinus pressure and sore throat.   ?Eyes:  Negative for pain, redness and visual disturbance.  ?Respiratory:  Negative for cough, shortness of breath and wheezing.   ?Cardiovascular:  Negative for chest pain and palpitations.  ?Gastrointestinal:  Negative for abdominal pain, blood in stool, constipation and diarrhea.  ?Endocrine: Negative for polydipsia and polyuria.  ?Genitourinary:  Negative for dysuria, frequency and urgency.  ?Musculoskeletal:  Positive for arthralgias. Negative for back pain and myalgias.  ?Skin:  Negative for pallor and rash.  ?Allergic/Immunologic: Negative for environmental allergies.  ?Neurological:  Negative for dizziness, syncope and headaches.  ?Hematological:  Negative for adenopathy. Does not bruise/bleed easily.  ?Psychiatric/Behavioral:  Negative for decreased concentration and dysphoric mood. The patient is nervous/anxious.    ? ?   ?Objective:  ? Physical Exam ?Constitutional:   ?   General: She is not in acute distress. ?   Appearance: Normal appearance. She is well-developed. She is obese. She is not ill-appearing or diaphoretic.  ?HENT:  ?   Head: Normocephalic and atraumatic.  ?   Right Ear: Tympanic membrane, ear canal and external ear normal.  ?   Left Ear: Tympanic membrane, ear canal and external ear normal.  ?   Nose: Nose normal. No congestion.  ?   Mouth/Throat:  ?   Mouth: Mucous membranes are moist.  ?   Pharynx: Oropharynx is clear. No posterior oropharyngeal erythema.  ?Eyes:  ?   General: No scleral icterus. ?   Extraocular Movements: Extraocular movements intact.  ?   Conjunctiva/sclera: Conjunctivae normal.  ?   Pupils: Pupils are equal, round, and reactive to light.  ?Neck:  ?   Thyroid: No thyromegaly.  ?   Vascular: No carotid bruit or JVD.  ?Cardiovascular:  ?   Rate and Rhythm: Normal rate and regular rhythm.  ?   Pulses: Normal pulses.  ?   Heart sounds: Normal heart sounds.  ?  No gallop.  ?Pulmonary:  ?   Effort: Pulmonary effort is normal. No respiratory distress.  ?   Breath sounds: Normal breath sounds. No wheezing.  ?   Comments: Good air exch ?Chest:  ?   Chest wall: No tenderness.  ?Abdominal:  ?   General: Bowel sounds are normal. There is no distension or abdominal bruit.  ?   Palpations: Abdomen is soft. There is no mass.  ?   Tenderness: There is no abdominal tenderness.  ?   Hernia: No hernia is present.  ?Genitourinary: ?   Comments: Breast exam: No mass, nodules, thickening, tenderness, bulging, retraction, inflamation, nipple discharge or skin changes noted.  No axillary or clavicular LA.     ?Baseline surgical changes noted  ?Musculoskeletal:     ?   General: No tenderness. Normal range of motion.  ?   Cervical back: Normal range of motion and neck supple. No rigidity. No muscular tenderness.  ?   Right lower leg: No edema.  ?   Left lower leg: No edema.  ?   Comments: No kyphosis    ?Lymphadenopathy:  ?   Cervical: No cervical adenopathy.  ?Skin: ?   General: Skin is warm and dry.  ?   Coloration: Skin is not pale.  ?   Findings: No erythema or rash.  ?   Comments: Solar lentigines diffusely ?Solar aging  ?  ?Neurological:  ?   Mental Status: She is alert.  Mental status is at baseline.  ?   Cranial Nerves: No cranial nerve deficit.  ?   Motor: No abnormal muscle tone.  ?   Coordination: Coordination normal.  ?   Gait: Gait normal.  ?   Deep Tendon Reflexes: Reflexes are normal and symmetric. Reflexes normal.  ?Psychiatric:     ?   Mood and Affect: Mood normal.     ?   Cognition and Memory: Cognition and memory normal.  ? ? ? ? ? ?   ?Assessment & Plan:  ? ?Problem List Items Addressed This Visit   ? ?  ? Cardiovascular and Mediastinum  ? Essential hypertension (Chronic)  ?  bp in fair control at this time  ?BP Readings from Last 1 Encounters:  ?11/27/21 (!) 146/84  ?This has been elevated the last few visits ?Plan to start losartan 25 mg daily which she tolerates well in the past ?Follow-up in 1 month ?Most recent labs reviewed  ?Disc lifstyle change with low sodium diet and exercise  ? ?  ?  ? Relevant Medications  ? losartan (COZAAR) 25 MG tablet  ?  ? Respiratory  ? Smokers' cough ([place])  ?  Patient is not ready to quit smoking yet unfortunately ?Given prescription for Tessalon Perles to try 200 mg 3 times daily as needed swallow whole ?Consider pulmonary referral if this worsens or fails to improve ?Encouraged her to discuss lung cancer screening with her oncology ?  ?  ?  ? Nervous and Auditory  ? Drug-induced polyneuropathy ([place])  ?  No clinical changes or medications at this time ?  ?  ?  ? Musculoskeletal and Integument  ? Osteopenia  ?  Reviewed DEXA from December 2022 ?There has been a decrease in bone density ?Patient denies any falls or fractures ?Taking chronic anastrozole for breast cancer currently ?Taking calcium and vitamin D, exercise is walking over 10,000 steps a day ?Plans  to discuss treatment if needed with her oncologist ? ?  ?  ?  ? Other  ? Prediabetes  ?  Lab Results  ?Component Value Date  ? HGBA1C 5.9 11/23/2021  ?disc imp of low glycemic diet and wt loss to prevent DM2  ?

## 2021-11-27 NOTE — Patient Instructions (Addendum)
Zyrtec or claritin or allegra over the counter are great for allergies  ? ?Discuss bone density the next time you see oncology  ? ?Try the tessalon for cough  ? ?Start losartan 25 mg daily for blood pressure ?Follow up in a month  ? ?Continue your other medicines  ? ? ? ? ? ? ?

## 2021-11-27 NOTE — Assessment & Plan Note (Signed)
Disc goals for lipids and reasons to control them ?Rev last labs with pt ?Rev low sat fat diet in detail ?Patient missed her atorvastatin for 2 weeks but is back on it now ?LDL went up to 97 but expect this to decrease for the next check ?

## 2021-11-27 NOTE — Assessment & Plan Note (Signed)
Reviewed health habits including diet and exercise and skin cancer prevention ?Reviewed appropriate screening tests for age  ?Also reviewed health mt list, fam hx and immunization status , as well as social and family history   ?See HPI ?Encouraged smoking cessation ?She has had both Shingrix vaccines ?Missed her flu shot this year but plans to get it in the fall ?Mammogram up-to-date from 12 of 2022 in the setting of personal history of breast cancer ongoing oncology follow-up ?Last Pap was 2019 normal, no GYN complaints ?Last bone density test was 12 of 2022 with osteopenia that was slightly worse ?No falls or fractures, takes calcium and vitamin D ?Encouraged exercise ?

## 2021-11-27 NOTE — Assessment & Plan Note (Signed)
Lab Results  ?Component Value Date  ? HGBA1C 5.9 11/23/2021  ? ?disc imp of low glycemic diet and wt loss to prevent DM2  ?

## 2021-11-27 NOTE — Assessment & Plan Note (Addendum)
Reviewed DEXA from December 2022 ?There has been a decrease in bone density ?Patient denies any falls or fractures ?Taking chronic anastrozole for breast cancer currently ?Taking calcium and vitamin D, exercise is walking over 10,000 steps a day ?Plans to discuss treatment if needed with her oncologist ? ?

## 2021-12-28 ENCOUNTER — Ambulatory Visit: Payer: BC Managed Care – PPO | Admitting: Family Medicine

## 2022-01-01 ENCOUNTER — Ambulatory Visit: Payer: BC Managed Care – PPO | Admitting: Family Medicine

## 2022-01-03 ENCOUNTER — Encounter: Payer: Self-pay | Admitting: Family Medicine

## 2022-01-03 ENCOUNTER — Ambulatory Visit: Payer: BC Managed Care – PPO | Admitting: Family Medicine

## 2022-01-03 VITALS — BP 128/86 | HR 82 | Temp 97.7°F | Ht 62.0 in | Wt 159.4 lb

## 2022-01-03 DIAGNOSIS — J410 Simple chronic bronchitis: Secondary | ICD-10-CM | POA: Diagnosis not present

## 2022-01-03 DIAGNOSIS — R7303 Prediabetes: Secondary | ICD-10-CM

## 2022-01-03 DIAGNOSIS — I10 Essential (primary) hypertension: Secondary | ICD-10-CM

## 2022-01-03 DIAGNOSIS — F17200 Nicotine dependence, unspecified, uncomplicated: Secondary | ICD-10-CM | POA: Diagnosis not present

## 2022-01-03 NOTE — Assessment & Plan Note (Signed)
Cough is much improved today  ?In retrospect, thinks pollen allergies added to this last time  ? ?Has not needed tessalon  ?Disc need for smoking cessation  ? ?

## 2022-01-03 NOTE — Assessment & Plan Note (Signed)
bp in fair control at this time  ?BP Readings from Last 1 Encounters:  ?01/03/22 128/86  ?improved with addn of losartan 25 mg daily  ?Tolerates well ?No changes needed ?Most recent labs reviewed  ?Disc lifstyle change with low sodium diet and exercise  ?Strongly enc to quit smoking ?
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
## 2022-01-03 NOTE — Patient Instructions (Signed)
I think it is a good idea to pursue the lung cancer screening program through oncology  ? ?Please set up your voice mail  ?I will reach out to them to reach you so they can leave a message  ? ?Blood pressure looks good  ?Continue the losartan  ? ?Keep thinking about quitting smoking  ?

## 2022-01-03 NOTE — Assessment & Plan Note (Signed)
Disc in detail risks of smoking and possible outcomes including copd, vascular/ heart disease, cancer , respiratory and sinus infections  ?Pt voices understanding ?She is not ready to quit but thinks when she does she may need to use a vape initially  ? ?Again suggest consideration of the CT lung cancer screen program  ?Pt is interested ?Oncologist recommended in the past but could not reach her  ?She is setting up voicemail now  ?Will reach out  ?

## 2022-02-23 ENCOUNTER — Other Ambulatory Visit: Payer: Self-pay | Admitting: Internal Medicine

## 2022-05-21 ENCOUNTER — Other Ambulatory Visit: Payer: Self-pay | Admitting: Internal Medicine

## 2022-06-10 ENCOUNTER — Ambulatory Visit: Payer: BC Managed Care – PPO | Admitting: Family Medicine

## 2022-06-18 NOTE — Progress Notes (Unsigned)
    Amy Leonard T. Nick Stults, MD, [place] at [facility] [place], [address]  Phone: [phone]  FAX: [phone]  Amy Leonard - 61 y.o. female  MRN [id number]  Date of Birth: 15-Apr-1961  Date: 06/19/2022  PCP: Abner Greenspan, MD  Referral: Abner Greenspan, MD  No chief complaint on file.  Subjective:   Amy Leonard is a 61 y.o. very pleasant female patient with There is no height or weight on file to calculate BMI. who presents with the following:  Pleasant young patient presents with knee pain.    Review of Systems is noted in the HPI, as appropriate  Objective:   There were no vitals taken for this visit.  GEN: No acute distress; alert,appropriate. PULM: Breathing comfortably in no respiratory distress PSYCH: Normally interactive.   Laboratory and Imaging Data:  Assessment and Plan:   ***

## 2022-06-19 ENCOUNTER — Encounter: Payer: Self-pay | Admitting: Family Medicine

## 2022-06-19 ENCOUNTER — Ambulatory Visit: Payer: BC Managed Care – PPO | Admitting: Family Medicine

## 2022-06-19 VITALS — BP 110/60 | HR 96 | Temp 98.1°F | Ht 62.0 in | Wt 156.2 lb

## 2022-06-19 DIAGNOSIS — M1711 Unilateral primary osteoarthritis, right knee: Secondary | ICD-10-CM

## 2022-06-19 MED ORDER — MELOXICAM 15 MG PO TABS: 15.0000 mg | ORAL_TABLET | Freq: Every day | ORAL | 1 refills | Status: DC

## 2022-06-19 MED ORDER — TRIAMCINOLONE ACETONIDE 40 MG/ML IJ SUSP
40.0000 mg | Freq: Once | INTRAMUSCULAR | Status: AC
  Administered 2022-06-19: 40 mg via INTRA_ARTICULAR

## 2022-06-20 ENCOUNTER — Other Ambulatory Visit: Payer: Self-pay | Admitting: Family Medicine

## 2022-06-20 NOTE — Telephone Encounter (Signed)
I am basically positive that she told me that she could take Meloxicam, but can you confirm with the patient given pharmacy alert?

## 2022-06-20 NOTE — Telephone Encounter (Signed)
Spoke with Amy Leonard.  She states she has been on Meloxicam for a while and tolerates it well.  She states she takes with with food without issues.   [facility] notified to dispense as prescribed.

## 2022-06-20 NOTE — Telephone Encounter (Signed)
Pharmacy comment: Patient has indicated to [place] that they are either allergic or sensitive to nsaid. There is possible cross-allergy. May we dispense? Please respond with appropriate changes or comment to Pharmacy.  Patient has indicated to [place] that they are either allergic or sensitive to nsaid. There is possible cross-allergy. May we dispense? Please respond with appropriate changes or comment to Pharmacy.

## 2022-06-24 ENCOUNTER — Encounter (INDEPENDENT_AMBULATORY_CARE_PROVIDER_SITE_OTHER): Payer: Self-pay

## 2022-07-12 ENCOUNTER — Other Ambulatory Visit: Payer: Self-pay | Admitting: Family Medicine

## 2022-07-12 DIAGNOSIS — Z1231 Encounter for screening mammogram for malignant neoplasm of breast: Secondary | ICD-10-CM

## 2022-07-20 ENCOUNTER — Ambulatory Visit
Admission: EM | Admit: 2022-07-20 | Discharge: 2022-07-20 | Disposition: A | Discharge status: Home / Self Care | Payer: BC Managed Care – PPO | Attending: Urgent Care | Admitting: Urgent Care

## 2022-07-20 DIAGNOSIS — G43919 Migraine, unspecified, intractable, without status migrainosus: Secondary | ICD-10-CM | POA: Diagnosis not present

## 2022-07-20 MED ORDER — DEXAMETHASONE SODIUM PHOSPHATE 10 MG/ML IJ SOLN
10.0000 mg | Freq: Once | INTRAMUSCULAR | Status: AC
  Administered 2022-07-20: 10 mg via INTRAMUSCULAR

## 2022-07-20 MED ORDER — PROMETHAZINE HCL 25 MG PO TABS: 25.0000 mg | ORAL_TABLET | Freq: Once | ORAL | 0 refills | Status: DC

## 2022-07-20 NOTE — Discharge Instructions (Addendum)
You have received an injection of a steroid today to treat your migraine pain.  This is an anti-inflammatory medication.  I also prescribed an antinausea medication that should make you sleepy.  Recommend that you pick up this prescription from your pharmacy take it and plan to take a nap.  As we discussed, if your symptoms do not resolve, recommend that you follow-up with your primary care provider for additional evaluation for possible change in your headache pattern.

## 2022-07-20 NOTE — ED Notes (Signed)
Triaged by provider  
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
## 2022-07-23 ENCOUNTER — Encounter: Payer: Self-pay | Admitting: Family Medicine

## 2022-07-23 ENCOUNTER — Ambulatory Visit: Payer: BC Managed Care – PPO | Admitting: Family Medicine

## 2022-07-23 VITALS — BP 135/80 | HR 107 | Temp 97.5°F | Ht 62.0 in | Wt 161.4 lb

## 2022-07-23 DIAGNOSIS — G43001 Migraine without aura, not intractable, with status migrainosus: Secondary | ICD-10-CM | POA: Diagnosis not present

## 2022-07-23 MED ORDER — PREDNISONE 10 MG PO TABS: ORAL_TABLET | ORAL | 0 refills | Status: DC

## 2022-07-23 NOTE — Patient Instructions (Addendum)
Call and schedule your eye exam   Let's try a prednisone course to break the headache cycle   Take it as directed It may make you feel hyper and hungry   Drink lots of fluids Avoid excessive caffeine   Hold off on the tylenol for now  Use a cold compress on your head where it hurts   Update if not starting to improve in a week or if worsening   If symptoms suddenly worsen or headache becomes severe go to the ER

## 2022-07-23 NOTE — Assessment & Plan Note (Signed)
This episode started last week w/o aura and did not respond to tylenol as it usually does  Went to UC and notes/findings reviewed , improved with decadron IM but not resolved Today still mild lingering ha Reassuring exam Px prednisone 30 mg taper to help break cycle Disc fluid intake/ caffeine limitation, stress and triggers  ER precautions noted  Will call if worse  Update if not starting to improve in a week or if worsening
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
## 2022-08-06 DIAGNOSIS — H43393 Other vitreous opacities, bilateral: Secondary | ICD-10-CM | POA: Diagnosis not present

## 2022-08-08 ENCOUNTER — Ambulatory Visit: Payer: BC Managed Care – PPO | Admitting: Internal Medicine

## 2022-08-08 ENCOUNTER — Other Ambulatory Visit: Payer: BC Managed Care – PPO

## 2022-08-13 ENCOUNTER — Ambulatory Visit
Admission: RE | Admit: 2022-08-13 | Discharge: 2022-08-13 | Disposition: A | Discharge status: Home / Self Care | Payer: BC Managed Care – PPO | Source: Ambulatory Visit | Attending: Family Medicine | Admitting: Family Medicine

## 2022-08-13 DIAGNOSIS — Z1231 Encounter for screening mammogram for malignant neoplasm of breast: Secondary | ICD-10-CM | POA: Insufficient documentation

## 2022-08-15 ENCOUNTER — Other Ambulatory Visit: Payer: Self-pay | Admitting: Internal Medicine

## 2022-08-15 NOTE — Telephone Encounter (Signed)
Last visit 08/08/21--Currently on Arimidex which she started in 2015.  Tolerating well without any side effects.  Plan is for 10 years of therapy  Next visit 08/21/22

## 2022-08-20 ENCOUNTER — Other Ambulatory Visit: Payer: Self-pay | Admitting: *Deleted

## 2022-08-20 DIAGNOSIS — J101 Influenza due to other identified influenza virus with other respiratory manifestations: Secondary | ICD-10-CM | POA: Diagnosis not present

## 2022-08-20 DIAGNOSIS — Z170 Estrogen receptor positive status [ER+]: Secondary | ICD-10-CM

## 2022-08-20 DIAGNOSIS — M8589 Other specified disorders of bone density and structure, multiple sites: Secondary | ICD-10-CM

## 2022-08-20 DIAGNOSIS — Z03818 Encounter for observation for suspected exposure to other biological agents ruled out: Secondary | ICD-10-CM | POA: Diagnosis not present

## 2022-08-21 ENCOUNTER — Inpatient Hospital Stay: Payer: BC Managed Care – PPO | Admitting: Internal Medicine

## 2022-08-21 ENCOUNTER — Inpatient Hospital Stay: Payer: BC Managed Care – PPO

## 2022-08-21 NOTE — Assessment & Plan Note (Deleted)
#  Bilateral synchronous breast cancer-early stage. Patient currently on adjuvant Arimidex [started 2015].  Stable.  No evidence of recurrence.  Discussed extended duration of therapy/10 years.  Patient interested; Mammogram DEc 2021-normal.  # BMD Dec 2020- T score= -1.4; -osteopenia- Ca+ vit.  STABLE.  Reviewed the bone density.  Discussed the treatment options-including bisphosphonate injections/pills.  I think it is reasonable to monitor for now.  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  STABLE.   # Smoking- continues to smoking; counseled to quit.  Discussed re: [facility] missed previous phone calls.  Not interested.  # DISPOSITION: # follow up in 12 months; MD; labs- cbc/cmp-  Dr.B  Cc; [place].

## 2022-08-21 NOTE — Progress Notes (Deleted)
[place] OFFICE PROGRESS NOTE  Patient Care Team: Tower, Wynelle Fanny, MD as PCP - General Rogue Bussing, Elisha Headland, MD as Consulting Physician (Internal Medicine)  Cancer of breast [facility])   Staging form: Breast, AJCC 7th Edition     Clinical: Stage IA (T1c, N0, M0) - Unsigned    Oncology History Overview Note  # 2014- SYNCHRONOUS BIL BREAST CA [Dr.K. Humphrey Rolls; [place]]  # RIGHT-IMC; lumpec & ALND-T1cN0; TNBC s/p RT  # LEFT-IMC;STAGE II  T1bN1 ER/PR-Pos Her 2 Neu-NEG s/p Lumpec & RT  # s/p AC x4- Taxol/ gem-Carbo  # MARCH 2015- STARTED Arimidex   # BMD- 2018- osteopenia  # BRCA testing- NEG [as per pt]  # PN- 1-2; intolerant to Neurontin/Lyrica; s/p accupuncture  # DIAGNOSIS: Breast cancer synchronous #Right side stage I triple negative #Left side-stage II ER PR positive HER-2/neu negative  GOALS: Cure  CURRENT/MOST RECENT THERAPY: Anastrozole [x Ettended]    Carcinoma of overlapping sites of left breast in female, estrogen receptor positive ([place])    INTERVAL HISTORY:  Amy Leonard 61 y.o.  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  Patient denies any new lumps or bumps.  Appetite is good.  No weight loss.  Chronic joint pains knee pains.  Not any worse.  Patient continues to be compliant with Arimidex. Review of Systems  Constitutional:  Negative for chills, diaphoresis, fever, malaise/fatigue and weight loss.  HENT:  Negative for nosebleeds and sore throat.   Eyes:  Negative for double vision.  Respiratory:  Negative for cough, hemoptysis, sputum production, shortness of breath and wheezing.   Cardiovascular:  Negative for chest pain, palpitations, orthopnea and leg swelling.  Gastrointestinal:  Negative for abdominal pain, blood in stool, constipation, diarrhea, heartburn, melena, nausea and vomiting.  Genitourinary:  Negative for dysuria, frequency and urgency.   Musculoskeletal:  Positive for back pain and joint pain.  Skin: Negative.  Negative for itching and rash.  Neurological:  Negative for dizziness, tingling, focal weakness, weakness and headaches.  Endo/Heme/Allergies:  Does not bruise/bleed easily.  Psychiatric/Behavioral:  Negative for depression. The patient is not nervous/anxious and does not have insomnia.      PAST MEDICAL HISTORY :  Past Medical History:  Diagnosis Date  . Anxiety   . Anxiety and depression   . Barrett's esophageal ulceration   . Basal ganglia infarction ([place]) 04/17/2012  . Breast cancer ([place]) 2014   BILATERAL LUMPECTOMY  . Carpal tunnel syndrome   . Chronic back pain    spinal stenosis  . Depression   . Elevated WBC count    nl diff  . Erosive gastritis   . Folliculitis 03/02/2422  . GERD (gastroesophageal reflux disease)   . Glaucoma    ?  Marland Kitchen History of chemotherapy 2014   BREAST CA  . HLD (hyperlipidemia)   . HSV infection    recurrent (side and buttox)  . Hypertension   . Migraine   . Personal history of chemotherapy 2014   bilateral breast ca  . Personal history of radiation therapy 2014   bilateral breast ca  . Radiation 2014   BREAST CA  . Tobacco abuse   . Wears dentures    top    PAST SURGICAL HISTORY :   Past Surgical History:  Procedure Laterality Date  . 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 BREAST LUMPECTOMY Bilateral 2014  bilateral breast ca  . BTL    . CHOLECYSTECTOMY  01/2021  . COLONOSCOPY    . epidural steroid injection  2008  . ERCP N/A 01/29/2021   Procedure: ENDOSCOPIC RETROGRADE CHOLANGIOPANCREATOGRAPHY (ERCP);  Surgeon: Lucilla Lame, MD;  Location[facility] ENDOSCOPY;  Service: Endoscopy;  Laterality: N/A;  . ERCP N/A 04/03/2021   Procedure: ENDOSCOPIC RETROGRADE CHOLANGIOPANCREATOGRAPHY (ERCP);  Surgeon: Lucilla Lame, MD;  Location: [facility] ENDOSCOPY;  Service: Endoscopy;  Laterality: N/A;  . LUMBAR FUSION  03/2016    L4-5  . PARTIAL MASTECTOMY WITH NEEDLE LOCALIZATION AND AXILLARY SENTINEL LYMPH NODE BX Bilateral 11/23/2012   Procedure: PARTIAL MASTECTOMY WITH NEEDLE LOCALIZATION AND AXILLARY SENTINEL LYMPH NODE BX;  Surgeon: Adin Hector, MD;  Location: [place];  Service: General;  Laterality: Bilateral;  . PORT-A-CATH REMOVAL Right 08/25/2013   Procedure: REMOVAL PORT-A-CATH;  Surgeon: Adin Hector, MD;  Location: [place];  Service: General;  Laterality: Right;  . PORTACATH PLACEMENT Right 11/23/2012   Procedure: INSERTION PORT-A-CATH;  Surgeon: Adin Hector, MD;  Location: [place];  Service: General;  Laterality: Right;  . TUBAL LIGATION    . UPPER GI ENDOSCOPY      FAMILY HISTORY :   Family History  Problem Relation Age of Onset  . Stroke Maternal Grandmother   . Diabetes Maternal Grandmother   . Diabetes Mother   . Hypertension Mother   . Leukemia Mother   . Obesity Mother   . Depression Sister   . Bipolar disorder Son        Bipolar / oppositional defiant  . Alcohol abuse Sister   . Drug abuse Sister   . Alcohol abuse Sister   . Drug abuse Sister   . Alcohol abuse Brother   . Drug abuse Brother   . Pancreatic cancer Brother   . Colon cancer Neg Hx   . Breast cancer Neg Hx     SOCIAL HISTORY:   Social History   Tobacco Use  . Smoking status: Every Day    Packs/day: 0.50    Years: 33.00    Total pack years: 16.50    Types: Cigarettes  . Smokeless tobacco: Never  Vaping Use  . Vaping Use: Never used  Substance Use Topics  . Alcohol use: Yes    Alcohol/week: 0.0 standard drinks of alcohol    Comment: beer rarely  . Drug use: No    ALLERGIES:  is allergic to gabapentin, lyrica [pregabalin], voltaren [diclofenac sodium], bupropion hcl, chlorhexidine gluconate, hydrocod poli-chlorphe poli er, lisinopril, and naproxen sodium.  MEDICATIONS:  Current Outpatient Medications  Medication Sig Dispense Refill  .  anastrozole (ARIMIDEX) 1 MG tablet TAKE 1 TABLET DAILY 90 tablet 0  . aspirin 81 MG chewable tablet Chew 1 tablet (81 mg total) by mouth daily. 90 tablet 3  . atorvastatin (LIPITOR) 10 MG tablet Take 1 tablet (10 mg total) by mouth daily. 90 tablet 3  . b complex vitamins tablet Take 2 tablets by mouth daily.    . benzonatate (TESSALON) 200 MG capsule Take 1 capsule (200 mg total) by mouth 2 (two) times daily as needed for cough. Swallow whole 30 capsule 1  . calcium carbonate (TUMS - DOSED IN MG ELEMENTAL CALCIUM) 500 MG chewable tablet Chew by mouth as directed.    . Calcium Carbonate-Vitamin D (CALCIUM + D PO) Take 1 tablet by mouth daily.    Marland Kitchen losartan (COZAAR) 25 MG tablet Take 1 tablet (25 mg total)  by mouth daily. 90 tablet 3  . meloxicam (MOBIC) 15 MG tablet TAKE 1 TABLET DAILY. 90 tablet 1  . omeprazole (PRILOSEC) 40 MG capsule Take 1 capsule (40 mg total) by mouth daily. 90 capsule 0  . predniSONE (DELTASONE) 10 MG tablet Take 3 pills once daily by mouth for 3 days, then 2 pills once daily for 3 days, then 1 pill once daily for 3 days and then stop 18 tablet 0  . promethazine-dextromethorphan (PROMETHAZINE-DM) 6.25-15 MG/5ML syrup Take 5 mLs by mouth 3 (three) times daily as needed for cough. 180 mL 0   No current facility-administered medications for this visit.    PHYSICAL EXAMINATION: ECOG PERFORMANCE STATUS: 0 - Asymptomatic  There were no vitals taken for this visit.  There were no vitals filed for this visit.   Physical Exam HENT:     Head: Normocephalic and atraumatic.     Mouth/Throat:     Pharynx: No oropharyngeal exudate.  Eyes:     Pupils: Pupils are equal, round, and reactive to light.  Cardiovascular:     Rate and Rhythm: Normal rate and regular rhythm.  Pulmonary:     Effort: No respiratory distress.     Breath sounds: No wheezing.  Abdominal:     General: Bowel sounds are normal. There is no distension.     Palpations: Abdomen is soft. There is no mass.      Tenderness: There is no abdominal tenderness. There is no guarding or rebound.  Musculoskeletal:        General: No tenderness. Normal range of motion.     Cervical back: Normal range of motion and neck supple.  Skin:    General: Skin is warm.  Neurological:     Mental Status: She is alert and oriented to person, place, and time.  Psychiatric:        Mood and Affect: Affect normal.     LABORATORY DATA:  I have reviewed the data as listed    Component Value Date/Time   NA 141 11/23/2021 1023   NA 140 07/25/2014 1100   NA 140 11/02/2013 1040   K 4.0 11/23/2021 1023   K 4.1 07/25/2014 1100   K 3.9 11/02/2013 1040   CL 105 11/23/2021 1023   CL 105 07/25/2014 1100   CL 109 (H) 02/17/2013 1120   CO2 31 11/23/2021 1023   CO2 30 07/25/2014 1100   CO2 24 11/02/2013 1040   GLUCOSE 102 (H) 11/23/2021 1023   GLUCOSE 97 07/25/2014 1100   GLUCOSE 103 11/02/2013 1040   GLUCOSE 99 02/17/2013 1120   BUN 15 11/23/2021 1023   BUN 15 07/25/2014 1100   BUN 9.3 11/02/2013 1040   CREATININE 0.56 11/23/2021 1023   CREATININE 0.74 07/25/2014 1100   CREATININE 0.7 11/02/2013 1040   CALCIUM 9.2 11/23/2021 1023   CALCIUM 8.6 07/25/2014 1100   CALCIUM 9.5 11/02/2013 1040   PROT 6.1 11/23/2021 1023   PROT 6.4 07/25/2014 1100   PROT 6.2 (L) 11/02/2013 1040   ALBUMIN 4.1 11/23/2021 1023   ALBUMIN 3.5 07/25/2014 1100   ALBUMIN 3.7 11/02/2013 1040   AST 16 11/23/2021 1023   AST 16 07/25/2014 1100   AST 20 11/02/2013 1040   ALT 16 11/23/2021 1023   ALT 24 07/25/2014 1100   ALT 22 11/02/2013 1040   ALKPHOS 59 11/23/2021 1023   ALKPHOS 75 07/25/2014 1100   ALKPHOS 84 11/02/2013 1040   BILITOT 0.6 11/23/2021 1023   BILITOT 0.3 07/25/2014  1100   BILITOT 0.48 11/02/2013 1040   GFRNONAA >60 08/08/2021 1002   GFRNONAA >60 07/25/2014 1100   GFRAA >60 07/29/2016 0914   GFRAA >60 07/25/2014 1100    No results found for: "SPEP", "UPEP"  Lab Results  Component Value Date   WBC 8.3  11/23/2021   NEUTROABS 4.9 11/23/2021   HGB 14.4 11/23/2021   HCT 42.8 11/23/2021   MCV 97.8 11/23/2021   PLT 223.0 11/23/2021      Chemistry      Component Value Date/Time   NA 141 11/23/2021 1023   NA 140 07/25/2014 1100   NA 140 11/02/2013 1040   K 4.0 11/23/2021 1023   K 4.1 07/25/2014 1100   K 3.9 11/02/2013 1040   CL 105 11/23/2021 1023   CL 105 07/25/2014 1100   CL 109 (H) 02/17/2013 1120   CO2 31 11/23/2021 1023   CO2 30 07/25/2014 1100   CO2 24 11/02/2013 1040   BUN 15 11/23/2021 1023   BUN 15 07/25/2014 1100   BUN 9.3 11/02/2013 1040   CREATININE 0.56 11/23/2021 1023   CREATININE 0.74 07/25/2014 1100   CREATININE 0.7 11/02/2013 1040      Component Value Date/Time   CALCIUM 9.2 11/23/2021 1023   CALCIUM 8.6 07/25/2014 1100   CALCIUM 9.5 11/02/2013 1040   ALKPHOS 59 11/23/2021 1023   ALKPHOS 75 07/25/2014 1100   ALKPHOS 84 11/02/2013 1040   AST 16 11/23/2021 1023   AST 16 07/25/2014 1100   AST 20 11/02/2013 1040   ALT 16 11/23/2021 1023   ALT 24 07/25/2014 1100   ALT 22 11/02/2013 1040   BILITOT 0.6 11/23/2021 1023   BILITOT 0.3 07/25/2014 1100   BILITOT 0.48 11/02/2013 1040       RADIOGRAPHIC STUDIES: I have personally reviewed the radiological images as listed and agreed with the findings in the report. No results found.   ASSESSMENT & PLAN:  No problem-specific Assessment & Plan notes found for this encounter.   No orders of the defined types were placed in this encounter.  All questions were answered. The patient knows to call the clinic with any problems, questions or concerns.      Cammie Sickle, MD 08/21/2022 8:26 AM

## 2022-09-04 ENCOUNTER — Inpatient Hospital Stay: Payer: BC Managed Care – PPO | Attending: Internal Medicine | Admitting: Internal Medicine

## 2022-09-04 ENCOUNTER — Inpatient Hospital Stay: Payer: BC Managed Care – PPO

## 2022-09-04 ENCOUNTER — Encounter: Payer: Self-pay | Admitting: Internal Medicine

## 2022-09-04 VITALS — BP 150/82 | HR 93 | Temp 96.9°F | Resp 16 | Wt 162.3 lb

## 2022-09-04 DIAGNOSIS — Z170 Estrogen receptor positive status [ER+]: Secondary | ICD-10-CM

## 2022-09-04 DIAGNOSIS — C50812 Malignant neoplasm of overlapping sites of left female breast: Secondary | ICD-10-CM | POA: Diagnosis not present

## 2022-09-04 DIAGNOSIS — M8580 Other specified disorders of bone density and structure, unspecified site: Secondary | ICD-10-CM | POA: Diagnosis not present

## 2022-09-04 DIAGNOSIS — F17210 Nicotine dependence, cigarettes, uncomplicated: Secondary | ICD-10-CM | POA: Insufficient documentation

## 2022-09-04 DIAGNOSIS — Z79811 Long term (current) use of aromatase inhibitors: Secondary | ICD-10-CM | POA: Diagnosis not present

## 2022-09-04 DIAGNOSIS — M810 Age-related osteoporosis without current pathological fracture: Secondary | ICD-10-CM | POA: Diagnosis not present

## 2022-09-04 DIAGNOSIS — M8589 Other specified disorders of bone density and structure, multiple sites: Secondary | ICD-10-CM

## 2022-09-04 LAB — CBC WITH DIFFERENTIAL/PLATELET
Abs Immature Granulocytes: 0.03 10*3/uL (ref 0.00–0.07)
Basophils Absolute: 0 10*3/uL (ref 0.0–0.1)
Basophils Relative: 0 %
Eosinophils Absolute: 0.1 10*3/uL (ref 0.0–0.5)
Eosinophils Relative: 1 %
HCT: 40.8 % (ref 36.0–46.0)
Hemoglobin: 14 g/dL (ref 12.0–15.0)
Immature Granulocytes: 0 %
Lymphocytes Relative: 38 %
Lymphs Abs: 4.3 10*3/uL — ABNORMAL HIGH (ref 0.7–4.0)
MCH: 32.8 pg (ref 26.0–34.0)
MCHC: 34.3 g/dL (ref 30.0–36.0)
MCV: 95.6 fL (ref 80.0–100.0)
Monocytes Absolute: 0.9 10*3/uL (ref 0.1–1.0)
Monocytes Relative: 8 %
Neutro Abs: 6 10*3/uL (ref 1.7–7.7)
Neutrophils Relative %: 53 %
Platelets: 238 10*3/uL (ref 150–400)
RBC: 4.27 MIL/uL (ref 3.87–5.11)
RDW: 13.2 % (ref 11.5–15.5)
WBC: 11.3 10*3/uL — ABNORMAL HIGH (ref 4.0–10.5)
nRBC: 0 % (ref 0.0–0.2)

## 2022-09-04 LAB — COMPREHENSIVE METABOLIC PANEL
ALT: 16 U/L (ref 0–44)
AST: 18 U/L (ref 15–41)
Albumin: 3.8 g/dL (ref 3.5–5.0)
Alkaline Phosphatase: 59 U/L (ref 38–126)
Anion gap: 8 (ref 5–15)
BUN: 11 mg/dL (ref 8–23)
CO2: 25 mmol/L (ref 22–32)
Calcium: 9.1 mg/dL (ref 8.9–10.3)
Chloride: 105 mmol/L (ref 98–111)
Creatinine, Ser: 0.59 mg/dL (ref 0.44–1.00)
GFR, Estimated: >60 mL/min (ref >60)
Glucose, Bld: 120 mg/dL — ABNORMAL HIGH (ref 70–99)
Potassium: 3.8 mmol/L (ref 3.5–5.1)
Sodium: 138 mmol/L (ref 135–145)
Total Bilirubin: 0.3 mg/dL (ref 0.3–1.2)
Total Protein: 6.4 g/dL — ABNORMAL LOW (ref 6.5–8.1)

## 2022-09-04 NOTE — Assessment & Plan Note (Addendum)
#   Bilateral synchronous breast cancer-early stage. Patient currently on adjuvant Arimidex [started 2014; stop end of 2024].  STABLE.  No evidence of recurrence.  Discussed extended duration of therapy/10 years.  BIL screening Mammogram DEC 2023 -normal.  # BMD Dec 2022- T score= -1.8 [2018=1.1; 2020=-1.5] -osteopenia- Ca+ vit.  Worsening. Reviewed the bone density.  Discussed the potential risk factors for osteoporosis- age/gender/postmenopausal status/use of anti-estrogen treatments. Discussed multiple options including exercise/ calcium and vitamin D supplementation/ and also use of bisphosphonates. Discussed oral bisphosphonates- declines.    #  Also discussed parenteral bisphosphonate like Reclast. Discussed the potential benefits and/side effects  Including but not limited to Osteonecrosis of jaw/ hypocalcemia.  Concerned about costs/ insurance.  Patient will call her insurance regarding Reclast cost.  For now will hold off scheduling Reclast.  Will get dental clearance; pt plans to have dental exctractions in next couple of months.   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  STABLE.   # Smoking- continues to smoking; counseled to quit.   # DISPOSITION: # follow up in 12 months; MD; labs- cbc/cmp-  Dr.B

## 2022-09-04 NOTE — Progress Notes (Signed)
[place] OFFICE PROGRESS NOTE  Patient Care Team: Tower, Wynelle Fanny, MD as PCP - General Rogue Bussing, Elisha Headland, MD as Consulting Physician (Internal Medicine)  Cancer of breast [facility])   Staging form: Breast, AJCC 7th Edition     Clinical: Stage IA (T1c, N0, M0) - Unsigned    Oncology History Overview Note  # 2014- SYNCHRONOUS BIL BREAST CA [Dr.K. Humphrey Rolls; [place]]  # RIGHT-IMC; lumpec & ALND-T1cN0; TNBC s/p RT  # LEFT-IMC;STAGE II  T1bN1 ER/PR-Pos Her 2 Neu-NEG s/p Lumpec & RT  # s/p AC x4- Taxol/ gem-Carbo  # MARCH 2015- STARTED Arimidex   # BMD- 2018- osteopenia  # BRCA testing- NEG [as per pt]  # PN- 1-2; intolerant to Neurontin/Lyrica; s/p accupuncture  # DIAGNOSIS: Breast cancer synchronous #Right side stage I triple negative #Left side-stage II ER PR positive HER-2/neu negative  GOALS: Cure  CURRENT/MOST RECENT THERAPY: Anastrozole [x Ettended]    Carcinoma of overlapping sites of left breast in female, estrogen receptor positive ([place])   INTERVAL HISTORY: Alone.  Ambulating independently.  Amy Taaffe Leonard 62 y.o.  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  Not been taking Calcium +D daily basis. Patient denies any new lumps or bumps.  Appetite is good.  No weight loss.  Chronic joint pains knee pains.  Not any worse.  Patient continues to be compliant with Arimidex.  Review of Systems  Constitutional:  Negative for chills, diaphoresis, fever, malaise/fatigue and weight loss.  HENT:  Negative for nosebleeds and sore throat.   Eyes:  Negative for double vision.  Respiratory:  Negative for cough, hemoptysis, sputum production, shortness of breath and wheezing.   Cardiovascular:  Negative for chest pain, palpitations, orthopnea and leg swelling.  Gastrointestinal:  Negative for abdominal pain, blood in stool, constipation, diarrhea, heartburn, melena, nausea and vomiting.   Genitourinary:  Negative for dysuria, frequency and urgency.  Musculoskeletal:  Positive for back pain and joint pain.  Skin: Negative.  Negative for itching and rash.  Neurological:  Negative for dizziness, tingling, focal weakness, weakness and headaches.  Endo/Heme/Allergies:  Does not bruise/bleed easily.  Psychiatric/Behavioral:  Negative for depression. The patient is not nervous/anxious and does not have insomnia.      PAST MEDICAL HISTORY :  Past Medical History:  Diagnosis Date   Anxiety    Anxiety and depression    Barrett's esophageal ulceration    Basal ganglia infarction ([place]) 04/17/2012   Breast cancer ([place]) 2014   BILATERAL LUMPECTOMY   Carpal tunnel syndrome    Chronic back pain    spinal stenosis   Depression    Elevated WBC count    nl diff   Erosive gastritis    Folliculitis 11/01/7562   GERD (gastroesophageal reflux disease)    Glaucoma    ?   History of chemotherapy 2014   BREAST CA   HLD (hyperlipidemia)    HSV infection    recurrent (side and buttox)   Hypertension    Migraine    Personal history of chemotherapy 2014   bilateral breast ca   Personal history of radiation therapy 2014   bilateral breast ca   Radiation 2014   BREAST CA   Tobacco abuse    Wears dentures    top    PAST SURGICAL HISTORY :   Past Surgical History:  Procedure Laterality Date   BREAST BIOPSY Bilateral 2014   Invasive ductal carcimona grade 1  Left- Grade 3 Rt   BREAST LUMPECTOMY Bilateral 2014   bilateral breast ca   BTL     CHOLECYSTECTOMY  01/2021   COLONOSCOPY     epidural steroid injection  2008   ERCP N/A 01/29/2021   Procedure: ENDOSCOPIC RETROGRADE CHOLANGIOPANCREATOGRAPHY (ERCP);  Surgeon: Lucilla Lame, MD;  Location: [facility] ENDOSCOPY;  Service: Endoscopy;  Laterality: N/A;   ERCP N/A 04/03/2021   Procedure: ENDOSCOPIC RETROGRADE CHOLANGIOPANCREATOGRAPHY (ERCP);  Surgeon: Lucilla Lame, MD;  Location: [facility] ENDOSCOPY;  Service: Endoscopy;  Laterality: N/A;    LUMBAR FUSION  03/2016   L4-5   PARTIAL MASTECTOMY WITH NEEDLE LOCALIZATION AND AXILLARY SENTINEL LYMPH NODE BX Bilateral 11/23/2012   Procedure: PARTIAL MASTECTOMY WITH NEEDLE LOCALIZATION AND AXILLARY SENTINEL LYMPH NODE BX;  Surgeon: Adin Hector, MD;  Location: [facility];  Service: General;  Laterality: Bilateral;   PORT-A-CATH REMOVAL Right 08/25/2013   Procedure: REMOVAL PORT-A-CATH;  Surgeon: Adin Hector, MD;  Location: [facility];  Service: General;  Laterality: Right;   PORTACATH PLACEMENT Right 11/23/2012   Procedure: INSERTION PORT-A-CATH;  Surgeon: Adin Hector, MD;  Location: [facility];  Service: General;  Laterality: Right;   TUBAL LIGATION     UPPER GI ENDOSCOPY      FAMILY HISTORY :   Family History  Problem Relation Age of Onset   Stroke Maternal Grandmother    Diabetes Maternal Grandmother    Diabetes Mother    Hypertension Mother    Leukemia Mother    Obesity Mother    Depression Sister    Bipolar disorder Son        Bipolar / oppositional defiant   Alcohol abuse Sister    Drug abuse Sister    Alcohol abuse Sister    Drug abuse Sister    Alcohol abuse Brother    Drug abuse Brother    Pancreatic cancer Brother    Colon cancer Neg Hx    Breast cancer Neg Hx     SOCIAL HISTORY:   Social History   Tobacco Use   Smoking status: Every Day    Packs/day: 0.50    Years: 33.00    Total pack years: 16.50    Types: Cigarettes   Smokeless tobacco: Never  Vaping Use   Vaping Use: Never used  Substance Use Topics   Alcohol use: Yes    Alcohol/week: 0.0 standard drinks of alcohol    Comment: beer rarely   Drug use: No    ALLERGIES:  is allergic to gabapentin, lyrica [pregabalin], voltaren [diclofenac sodium], bupropion hcl, chlorhexidine gluconate, hydrocod poli-chlorphe poli er, lisinopril, and naproxen sodium.  MEDICATIONS:  Current Outpatient Medications  Medication Sig Dispense Refill    anastrozole (ARIMIDEX) 1 MG tablet TAKE 1 TABLET DAILY 90 tablet 0   aspirin 81 MG chewable tablet Chew 1 tablet (81 mg total) by mouth daily. 90 tablet 3   atorvastatin (LIPITOR) 10 MG tablet Take 1 tablet (10 mg total) by mouth daily. 90 tablet 3   b complex vitamins tablet Take 2 tablets by mouth daily.     benzonatate (TESSALON) 200 MG capsule Take 1 capsule (200 mg total) by mouth 2 (two) times daily as needed for cough. Swallow whole 30 capsule 1   calcium carbonate (TUMS - DOSED IN MG ELEMENTAL CALCIUM) 500 MG chewable tablet Chew by mouth as directed.     Calcium Carbonate-Vitamin D (CALCIUM + D PO) Take 1 tablet by mouth daily.  ipratropium (ATROVENT) 0.03 % nasal spray Place 2 sprays into both nostrils every 12 (twelve) hours.     meloxicam (MOBIC) 15 MG tablet TAKE 1 TABLET DAILY. 90 tablet 1   omeprazole (PRILOSEC) 40 MG capsule Take 1 capsule (40 mg total) by mouth daily. 90 capsule 0   losartan (COZAAR) 25 MG tablet Take 1 tablet (25 mg total) by mouth daily. 90 tablet 3   predniSONE (DELTASONE) 10 MG tablet Take 3 pills once daily by mouth for 3 days, then 2 pills once daily for 3 days, then 1 pill once daily for 3 days and then stop (Patient not taking: Reported on 09/04/2022) 18 tablet 0   promethazine-dextromethorphan (PROMETHAZINE-DM) 6.25-15 MG/5ML syrup Take 5 mLs by mouth 3 (three) times daily as needed for cough. (Patient not taking: Reported on 09/04/2022) 180 mL 0   No current facility-administered medications for this visit.    PHYSICAL EXAMINATION: ECOG PERFORMANCE STATUS: 0 - Asymptomatic  BP (!) 150/82 (BP Location: Left Arm, Patient Position: Sitting)   Pulse 93   Temp (!) 96.9 F (36.1 C) (Tympanic)   Resp 16   Wt 162 lb 4.8 oz (73.6 kg)   BMI 29.69 kg/m   Filed Weights   09/04/22 1500  Weight: 162 lb 4.8 oz (73.6 kg)    Physical Exam HENT:     Head: Normocephalic and atraumatic.     Mouth/Throat:     Pharynx: No oropharyngeal exudate.  Eyes:      Pupils: Pupils are equal, round, and reactive to light.  Cardiovascular:     Rate and Rhythm: Normal rate and regular rhythm.  Pulmonary:     Effort: No respiratory distress.     Breath sounds: No wheezing.  Abdominal:     General: Bowel sounds are normal. There is no distension.     Palpations: Abdomen is soft. There is no mass.     Tenderness: There is no abdominal tenderness. There is no guarding or rebound.  Musculoskeletal:        General: No tenderness. Normal range of motion.     Cervical back: Normal range of motion and neck supple.  Skin:    General: Skin is warm.  Neurological:     Mental Status: She is alert and oriented to person, place, and time.  Psychiatric:        Mood and Affect: Affect normal.      LABORATORY DATA:  I have reviewed the data as listed    Component Value Date/Time   NA 138 09/04/2022 1445   NA 140 07/25/2014 1100   NA 140 11/02/2013 1040   K 3.8 09/04/2022 1445   K 4.1 07/25/2014 1100   K 3.9 11/02/2013 1040   CL 105 09/04/2022 1445   CL 105 07/25/2014 1100   CL 109 (H) 02/17/2013 1120   CO2 25 09/04/2022 1445   CO2 30 07/25/2014 1100   CO2 24 11/02/2013 1040   GLUCOSE 120 (H) 09/04/2022 1445   GLUCOSE 97 07/25/2014 1100   GLUCOSE 103 11/02/2013 1040   GLUCOSE 99 02/17/2013 1120   BUN 11 09/04/2022 1445   BUN 15 07/25/2014 1100   BUN 9.3 11/02/2013 1040   CREATININE 0.59 09/04/2022 1445   CREATININE 0.74 07/25/2014 1100   CREATININE 0.7 11/02/2013 1040   CALCIUM 9.1 09/04/2022 1445   CALCIUM 8.6 07/25/2014 1100   CALCIUM 9.5 11/02/2013 1040   PROT 6.4 (L) 09/04/2022 1445   PROT 6.4 07/25/2014 1100   PROT 6.2 (  L) 11/02/2013 1040   ALBUMIN 3.8 09/04/2022 1445   ALBUMIN 3.5 07/25/2014 1100   ALBUMIN 3.7 11/02/2013 1040   AST 18 09/04/2022 1445   AST 16 07/25/2014 1100   AST 20 11/02/2013 1040   ALT 16 09/04/2022 1445   ALT 24 07/25/2014 1100   ALT 22 11/02/2013 1040   ALKPHOS 59 09/04/2022 1445   ALKPHOS 75 07/25/2014  1100   ALKPHOS 84 11/02/2013 1040   BILITOT 0.3 09/04/2022 1445   BILITOT 0.3 07/25/2014 1100   BILITOT 0.48 11/02/2013 1040   GFRNONAA >60 09/04/2022 1445   GFRNONAA >60 07/25/2014 1100   GFRAA >60 07/29/2016 0914   GFRAA >60 07/25/2014 1100    No results found for: "SPEP", "UPEP"  Lab Results  Component Value Date   WBC 11.3 (H) 09/04/2022   NEUTROABS 6.0 09/04/2022   HGB 14.0 09/04/2022   HCT 40.8 09/04/2022   MCV 95.6 09/04/2022   PLT 238 09/04/2022      Chemistry      Component Value Date/Time   NA 138 09/04/2022 1445   NA 140 07/25/2014 1100   NA 140 11/02/2013 1040   K 3.8 09/04/2022 1445   K 4.1 07/25/2014 1100   K 3.9 11/02/2013 1040   CL 105 09/04/2022 1445   CL 105 07/25/2014 1100   CL 109 (H) 02/17/2013 1120   CO2 25 09/04/2022 1445   CO2 30 07/25/2014 1100   CO2 24 11/02/2013 1040   BUN 11 09/04/2022 1445   BUN 15 07/25/2014 1100   BUN 9.3 11/02/2013 1040   CREATININE 0.59 09/04/2022 1445   CREATININE 0.74 07/25/2014 1100   CREATININE 0.7 11/02/2013 1040      Component Value Date/Time   CALCIUM 9.1 09/04/2022 1445   CALCIUM 8.6 07/25/2014 1100   CALCIUM 9.5 11/02/2013 1040   ALKPHOS 59 09/04/2022 1445   ALKPHOS 75 07/25/2014 1100   ALKPHOS 84 11/02/2013 1040   AST 18 09/04/2022 1445   AST 16 07/25/2014 1100   AST 20 11/02/2013 1040   ALT 16 09/04/2022 1445   ALT 24 07/25/2014 1100   ALT 22 11/02/2013 1040   BILITOT 0.3 09/04/2022 1445   BILITOT 0.3 07/25/2014 1100   BILITOT 0.48 11/02/2013 1040       RADIOGRAPHIC STUDIES: I have personally reviewed the radiological images as listed and agreed with the findings in the report. No results found.   ASSESSMENT & PLAN:  Carcinoma of overlapping sites of left breast in female, estrogen receptor positive ([place]) # Bilateral synchronous breast cancer-early stage. Patient currently on adjuvant Arimidex [started 2014; stop end of 2024].  STABLE.  No evidence of recurrence.  Discussed extended  duration of therapy/10 years.  BIL screening Mammogram DEC 2023 -normal.  # BMD Dec 2022- T score= -1.8 [2018=1.1; 2020=-1.5] -osteopenia- Ca+ vit.  Worsening. Reviewed the bone density.  Discussed the potential risk factors for osteoporosis- age/gender/postmenopausal status/use of anti-estrogen treatments. Discussed multiple options including exercise/ calcium and vitamin D supplementation/ and also use of bisphosphonates. Discussed oral bisphosphonates- declines.    #  Also discussed parenteral bisphosphonate like Reclast. Discussed the potential benefits and/side effects  Including but not limited to Osteonecrosis of jaw/ hypocalcemia.  Concerned about costs/ insurance.  Patient will call her insurance regarding Reclast cost.  For now will hold off scheduling Reclast.  Will get dental clearance; pt plans to have dental exctractions in next couple of months.   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  STABLE.   # Smoking- continues to smoking; counseled to quit.   # DISPOSITION: # follow up in 12 months; MD; labs- cbc/cmp-  Dr.B   Orders Placed This Encounter  Procedures   CBC with Differential/Platelet    Standing Status:   Future    Standing Expiration Date:   09/04/2023   Comprehensive metabolic panel    Standing Status:   Future    Standing Expiration Date:   09/04/2023   All questions were answered. The patient knows to call the clinic with any problems, questions or concerns.      Cammie Sickle, MD 09/04/2022 8:40 PM

## 2022-09-04 NOTE — Progress Notes (Signed)
Not been taking Calcium +D daily basis.

## 2022-09-23 ENCOUNTER — Telehealth: Payer: Self-pay | Admitting: Family Medicine

## 2022-09-23 NOTE — Telephone Encounter (Signed)
I spoke with pt and she scheduled appt with Gentry Fitz NP on 09/24/22 at 9 AM with UC & ED precautions. Pt voiced understanding and said diarrhea had stopped until 09/22/22 around 3 PM and since then has had diarrhea stool not watery but loose x 5. Pt is drinking gatorade and does not have fever. Sending note to Gentry Fitz NP and [place] pool.

## 2022-09-23 NOTE — Telephone Encounter (Signed)
[facility] - Client TELEPHONE ADVICE RECORD [facility] Patient Name: Amy Leonard Gender: Female DOB: 10-Aug-1961 Age: 62 Y 11 M 25 D Return Phone Number: [phone] (Primary) Address: City/ State/ Zip: [place] [address] Client [facility] - Client Client Site [facility] Provider Glori Bickers, Roque Lias - MD Contact Type Call Who Is Calling Patient / Member / Family / Caregiver Call Type Triage / Clinical Relationship To Patient Self Return Phone Number [phone] (Primary) Chief Complaint Diarrhea Reason for Call Symptomatic / Request for Health Information Initial Comment Office put caller thru. Caller says that she has had diarrhea for more than a week, and headaches. No headache now, but had one all day yesterday. She is weak, and her thought process is slow, but she is not confused. She is drinking a lot of water. She urinated within 8 hours. Her dtr is a Marine scientist and said it coud be C-dif Translation No Nurse Assessment Nurse: Casey Burkitt, RN, Abran Richard Date/Time (Eastern Time): 09/23/2022 9:38:34 AM Confirm and document reason for call. If symptomatic, describe symptoms. ---caller states she has been having diarrhea for a week, has been taking kaopectate. Sausage patties for lunch for work. She has been feeling tired, she's been drinking Gatorade and smart water. Foul odor BM, almost black like a dark dark green. Does the patient have any new or worsening symptoms? ---Yes Will a triage be completed? ---Yes Related visit to physician within the last 2 weeks? ---No Does the PT have any chronic conditions? (i.e. diabetes, asthma, this includes High risk factors for pregnancy, etc.) ---Yes List chronic conditions. ---prediabetic Is this a behavioral health or substance abuse call? ---No Guidelines Guideline Title Affirmed Question Affirmed Notes Nurse Date/Time (Eastern Time) Diarrhea  [1] MODERATE diarrhea (e.g., 4-6 times / day more than normal) AND [2] Youman, RN, Abran Richard 09/23/2022 9:44:18 AM PLEASE NOTE: All timestamps contained within this report are represented as [place] Standard Time. CONFIDENTIALTY NOTICE: This fax transmission is intended only for the addressee. It contains information that is legally privileged, confidential or otherwise protected from use or disclosure. If you are not the intended recipient, you are strictly prohibited from reviewing, disclosing, copying using or disseminating any of this information or taking any action in reliance on or regarding this information. If you have received this fax in error, please notify us immediately by telephone so that we can arrange for its return to [place]. Phone: [phone], Toll-Free: [phone], Fax: [phone] Page: 2 of 3 Call Id: [id number] Guidelines Guideline Title Affirmed Question Affirmed Notes Nurse Date/Time Eilene Ghazi Time) present > 48 hours (2 days) Disp. Time Eilene Ghazi Time) Disposition Final User 09/23/2022 9:57:01 AM See PCP within 24 Hours Yes Casey Burkitt, RN, Abran Richard Final Disposition 09/23/2022 9:57:01 AM See PCP within 24 Hours Yes Youman, RN, Marnette Burgess Disagree/Comply Comply Caller Understands Yes PreDisposition Call Doctor Care Advice Given Per Guideline SEE PCP WITHIN 24 HOURS: * IF OFFICE WILL BE OPEN: You need to be examined within the next 24 hours. Call your doctor (or NP/PA) when the office opens and make an appointment. FLUID THERAPY DURING MILD TO MODERATE DIARRHEA: * Drink more fluids, at least 8 to 10 cups daily. One cup equals 8 oz (240 ml). * WATER: For mild to moderate diarrhea, water is often the best liquid to drink. You should also eat some salty foods (e.g., potato chips, pretzels, saltine crackers). This is important to make sure you are getting enough salt, sugars,  and fluids to meet your body's needs. * SPORTS DRINKS: You can also drink halfstrength sports  drinks (e.g., Gatorade, Powerade) to help treat and prevent dehydration. Mix the sports drink half and half with water. * AVOID caffeinated beverages. Reason: Caffeine is mildly dehydrating. * AVOID alcohol beverages (e.g., beer, wine, hard liquor). * AVOID carbonated soft drinks (soda) as these can make your diarrhea worse. FOOD AND NUTRITION DURING MILD TO MODERATE DIARRHEA: * Maintaining some food intake during episodes of diarrhea is important. * Begin with boiled starches / cereals (e.g., potatoes, rice, noodles, wheat, oats) with a small amount of salt to taste. * You can also eat bananas, yogurt, crackers, and soup. * Eat smaller meals and snacks more often during the day rather than 3 larger meals. * As the diarrhea starts to get better, you can slowly return to a normal diet. * AVOID milk and dairy products if these make your diarrhea worse. * AVOID greasy, fatty or spicy foods. DIARRHEA MEDICINE - LOPERAMIDE (IMODIUM AD): * This medicine helps decrease diarrhea. It is available over-the-counter (OTC) in a drugstore. * Adult dosage: 4 mg (2 capsules) is the recommended first dose. You may take an additional 2 mg (1 capsule) after each loose stool. * Maximum dosage: 8 mg per day (4 capsules). * Do not use for more than 2 days. DIARRHEA MEDICINE - LOPERAMIDE - EXTRA NOTES AND WARNINGS: * DO NOT use if there is a fever over 100.4 F (38.0 C) or if there is blood or mucus in the stools. * DO NOT drink tonic water. It can interact with loperamide and may cause a serious heart problem. * DO NOT take more than 8 mg per day (4 capsules) each day, or for longer than 2 days, unless told to do this by your doctor (or NP/PA). * Before taking any medicine, read all the instructions on the package. DIARRHEA MEDICINE - BISMUTH SUBSALICYLATE (E.G., PEPTO-BISMOL): * This medicine can help reduce diarrhea, vomiting, and abdominal cramping. It is available over-the-counter (OTC) in a drugstore. * Adult dosage:  Take two Pepto-Bismol caplets or tablets, or take two tablespoons (30 ml total) of Pepto-Bismol original strength liquid, by mouth every hour (if diarrhea continues) to a maximum of 8 doses in a 24 hour period. BISMUTH SUBSALICYLATE - EXTRA NOTES AND WARNINGS: * May cause a temporary darkening of stool and tongue. * Do not use if allergic to aspirin. * Do not use in pregnancy. * Bismuth subsalicylate is available in other over-the-counter medicines (e.g., Kaopectate). Be certain to follow the dosing instructions on the package as it varies by brand. * Before taking any medicine, read all the instructions on the package. [place] YOUR HANDS: * Wash your hands after using the bathroom. * Wash your hands before fixing or eating food. * If your work is cooking, Research scientist (medical), serving, or preparing food, then you should not work until the diarrhea has completely stopped. Check with your employer before going back to work. * Wash soiled towels, sheets, or clothes separately. * Do not share towels or sheets. * Do not swim for 2 weeks after diarrhea is gone. CALL BACK PLEASE NOTE: All timestamps contained within this report are represented as [place] Standard Time. CONFIDENTIALTY NOTICE: This fax transmission is intended only for the addressee. It contains information that is legally privileged, confidential or otherwise protected from use or disclosure. If you are not the intended recipient, you are strictly prohibited from reviewing, disclosing, copying using or disseminating any of this information  or taking any action in reliance on or regarding this information. If you have received this fax in error, please notify us immediately by telephone so that we can arrange for its return to [place]. Phone: [phone], Toll-Free: [phone], Fax: [phone] Page: 3 of 3 Call Id: [id number] Care Advice Given Per Guideline IF: * Signs of dehydration occur (e.g., no urine over 12 hours, very dry mouth, lightheaded, etc.) *  Bloody stools * Constant or severe abdomen pain * You become worse CARE ADVICE given per Diarrhea (Adult) guideline. Referrals REFERRED TO PCP OFFIC

## 2022-09-23 NOTE — Telephone Encounter (Signed)
Patient called and stated she have been having diarrhea or more than a week and some headaches as well. Patient was sent to access nurse.

## 2022-09-23 NOTE — Telephone Encounter (Signed)
Noted, will evaluate. 

## 2022-09-24 ENCOUNTER — Encounter: Payer: Self-pay | Admitting: Primary Care

## 2022-09-24 ENCOUNTER — Ambulatory Visit: Payer: BC Managed Care – PPO | Admitting: Primary Care

## 2022-09-24 VITALS — BP 136/80 | HR 95 | Temp 98.2°F | Ht 62.0 in | Wt 159.0 lb

## 2022-09-24 DIAGNOSIS — R197 Diarrhea, unspecified: Secondary | ICD-10-CM

## 2022-09-24 LAB — BASIC METABOLIC PANEL
BUN: 10 mg/dL (ref 6–23)
CO2: 29 mEq/L (ref 19–32)
Calcium: 9.3 mg/dL (ref 8.4–10.5)
Chloride: 102 mEq/L (ref 96–112)
Creatinine, Ser: 0.58 mg/dL (ref 0.40–1.20)
GFR: 97.29 mL/min (ref >60.00)
Glucose, Bld: 92 mg/dL (ref 70–99)
Potassium: 4.5 mEq/L (ref 3.5–5.1)
Sodium: 139 mEq/L (ref 135–145)

## 2022-09-24 LAB — CBC WITH DIFFERENTIAL/PLATELET
Basophils Absolute: 0 10*3/uL (ref 0.0–0.1)
Basophils Relative: 0.5 % (ref 0.0–3.0)
Eosinophils Absolute: 0.1 10*3/uL (ref 0.0–0.7)
Eosinophils Relative: 1.4 % (ref 0.0–5.0)
HCT: 42.3 % (ref 36.0–46.0)
Hemoglobin: 14.5 g/dL (ref 12.0–15.0)
Lymphocytes Relative: 33.5 % (ref 12.0–46.0)
Lymphs Abs: 3.3 10*3/uL (ref 0.7–4.0)
MCHC: 34.2 g/dL (ref 30.0–36.0)
MCV: 98.4 fl (ref 78.0–100.0)
Monocytes Absolute: 0.7 10*3/uL (ref 0.1–1.0)
Monocytes Relative: 7.5 % (ref 3.0–12.0)
Neutro Abs: 5.6 10*3/uL (ref 1.4–7.7)
Neutrophils Relative %: 57.1 % (ref 43.0–77.0)
Platelets: 251 10*3/uL (ref 150.0–400.0)
RBC: 4.3 Mil/uL (ref 3.87–5.11)
RDW: 13.5 % (ref 11.5–15.5)
WBC: 9.8 10*3/uL (ref 4.0–10.5)

## 2022-09-24 NOTE — Patient Instructions (Signed)
Stop by the lab prior to leaving today. I will notify you of your results once received.    Please bring the stool kit back if you have any more diarrhea.   Please follow up with Dr. Glori Bickers if symptoms do not improve or worsen.

## 2022-09-24 NOTE — Assessment & Plan Note (Addendum)
Viral vs. Bacterial gastroenteritis. Less likely inflammatory bowel.  Fortunately, symptoms are improving. Continue Gatorade, Pedialyte.   Labs today pending including CBC with diff, BMP pending. Stool studies pending. She will return if diarrhea resumes.    Reviewed colonoscopy from 2014. Patient will discuss with PCP about a referral for next colonoscopy.  Return precautions provided. Await results.  I evaluated patient, was consulted regarding treatment, and agree with assessment and plan per Tinnie Gens, RN, DNP student.   Allie Bossier, NP-C

## 2022-09-24 NOTE — Progress Notes (Signed)
Subjective:    Patient ID: Amy Leonard, female    DOB: February 11, 1961, 62 y.o.   MRN: [id number]  Diarrhea  Pertinent negatives include no abdominal pain, chills or fever.    Amy Leonard is a very pleasant 62 y.o. female patient of Dr. Glori Bickers with a history of hypertension, choledocholithiasis, cholecystectomy, CVA, breast cancer who presents today to discuss diarrhea.   Symptom onset 09/16/22, intermittent, watery with small chunks of food, occurring 3-4 times daily, appeared dark, mild nausea without vomiting. Symptoms improved later last week, originally thought symptoms were secondary to food poisoning from a rotisserie chicken. Symptoms resumed intermittently again 6 days ago, alternating watery and soft stools.   She's been drinking Gatorade and electrolyte beverages. She denies palpitations, fevers, chills, abdominal pain, urinary symptoms, recent antibiotic use. Overall today she's feeling better than last week. This morning she's had 1 soft bowel movement.   Following with oncology for history of breast cancer, and she is managed on Arimidex, due to discontinue in late 2024. Last office visit was 09/04/22.  Last colonoscopy was in 2014, due this year.    Review of Systems  Constitutional:  Negative for chills and fever.  Gastrointestinal:  Positive for diarrhea. Negative for abdominal pain and blood in stool.  Neurological:  Negative for dizziness.         Past Medical History:  Diagnosis Date   Anxiety    Anxiety and depression    Barrett's esophageal ulceration    Basal ganglia infarction ([place]) 04/17/2012   Breast cancer ([place]) 2014   BILATERAL LUMPECTOMY   Carpal tunnel syndrome    Chronic back pain    spinal stenosis   Depression    Elevated WBC count    nl diff   Erosive gastritis    Folliculitis 12/30/4330   GERD (gastroesophageal reflux disease)    Glaucoma    ?   History of chemotherapy 2014   BREAST CA   HLD (hyperlipidemia)    HSV infection     recurrent (side and buttox)   Hypertension    Migraine    Personal history of chemotherapy 2014   bilateral breast ca   Personal history of radiation therapy 2014   bilateral breast ca   Radiation 2014   BREAST CA   Tobacco abuse    Wears dentures    top    Social History   Socioeconomic History   Marital status: Single    Spouse name: Not on file   Number of children: Not on file   Years of education: Not on file   Highest education level: Not on file  Occupational History   Occupation: Full time and PT job  Tobacco Use   Smoking status: Every Day    Packs/day: 0.50    Years: 33.00    Total pack years: 16.50    Types: Cigarettes   Smokeless tobacco: Never  Vaping Use   Vaping Use: Never used  Substance and Sexual Activity   Alcohol use: Yes    Alcohol/week: 0.0 standard drinks of alcohol    Comment: beer rarely   Drug use: No   Sexual activity: Not Currently  Other Topics Concern   Not on file  Social History Narrative   Separated; full time and part time job; no regular exercise.             Social Determinants of Health   Financial Resource Strain: Not on file  Food Insecurity: Not on file  Transportation Needs: Not on file  Physical Activity: Not on file  Stress: Not on file  Social Connections: Not on file  Intimate Partner Violence: Not on file    Past Surgical History:  Procedure Laterality Date   BREAST BIOPSY Bilateral 2014   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   BREAST LUMPECTOMY Bilateral 2014   bilateral breast ca   BTL     CHOLECYSTECTOMY  01/2021   COLONOSCOPY     epidural steroid injection  2008   ERCP N/A 01/29/2021   Procedure: ENDOSCOPIC RETROGRADE CHOLANGIOPANCREATOGRAPHY (ERCP);  Surgeon: Lucilla Lame, MD;  Location: [facility] ENDOSCOPY;  Service: Endoscopy;  Laterality: N/A;   ERCP N/A 04/03/2021   Procedure: ENDOSCOPIC RETROGRADE CHOLANGIOPANCREATOGRAPHY (ERCP);  Surgeon: Lucilla Lame, MD;  Location: [facility]  ENDOSCOPY;  Service: Endoscopy;  Laterality: N/A;   LUMBAR FUSION  03/2016   L4-5   PARTIAL MASTECTOMY WITH NEEDLE LOCALIZATION AND AXILLARY SENTINEL LYMPH NODE BX Bilateral 11/23/2012   Procedure: PARTIAL MASTECTOMY WITH NEEDLE LOCALIZATION AND AXILLARY SENTINEL LYMPH NODE BX;  Surgeon: Adin Hector, MD;  Location: [place];  Service: General;  Laterality: Bilateral;   PORT-A-CATH REMOVAL Right 08/25/2013   Procedure: REMOVAL PORT-A-CATH;  Surgeon: Adin Hector, MD;  Location: [place];  Service: General;  Laterality: Right;   PORTACATH PLACEMENT Right 11/23/2012   Procedure: INSERTION PORT-A-CATH;  Surgeon: Adin Hector, MD;  Location: [place];  Service: General;  Laterality: Right;   TUBAL LIGATION     UPPER GI ENDOSCOPY      Family History  Problem Relation Age of Onset   Stroke Maternal Grandmother    Diabetes Maternal Grandmother    Diabetes Mother    Hypertension Mother    Leukemia Mother    Obesity Mother    Depression Sister    Bipolar disorder Son        Bipolar / oppositional defiant   Alcohol abuse Sister    Drug abuse Sister    Alcohol abuse Sister    Drug abuse Sister    Alcohol abuse Brother    Drug abuse Brother    Pancreatic cancer Brother    Colon cancer Neg Hx    Breast cancer Neg Hx     Allergies  Allergen Reactions   Gabapentin Swelling   Lyrica [Pregabalin] Other (See Comments)    sedated   Voltaren [Diclofenac Sodium] Nausea And Vomiting   Bupropion Hcl Nausea Only and Other (See Comments)     GI, sleepy   Chlorhexidine Gluconate Itching and Rash   Hydrocod Poli-Chlorphe Poli Er Nausea And Vomiting    vomiting   Lisinopril Other (See Comments)    Leg cramps     Naproxen Sodium Other (See Comments)    does not tolerate    Current Outpatient Medications on File Prior to Visit  Medication Sig Dispense Refill   anastrozole (ARIMIDEX) 1 MG tablet TAKE 1 TABLET DAILY 90 tablet 0    aspirin 81 MG chewable tablet Chew 1 tablet (81 mg total) by mouth daily. 90 tablet 3   atorvastatin (LIPITOR) 10 MG tablet Take 1 tablet (10 mg total) by mouth daily. 90 tablet 3   b complex vitamins tablet Take 2 tablets by mouth daily.     calcium carbonate (TUMS - DOSED IN MG ELEMENTAL CALCIUM) 500 MG chewable tablet Chew by mouth as directed.     Calcium Carbonate-Vitamin D (CALCIUM + D PO) Take 1 tablet  by mouth daily.     ipratropium (ATROVENT) 0.03 % nasal spray Place 2 sprays into both nostrils every 12 (twelve) hours.     losartan (COZAAR) 25 MG tablet Take 1 tablet (25 mg total) by mouth daily. 90 tablet 3   meloxicam (MOBIC) 15 MG tablet TAKE 1 TABLET DAILY. 90 tablet 1   omeprazole (PRILOSEC) 40 MG capsule Take 1 capsule (40 mg total) by mouth daily. 90 capsule 0   benzonatate (TESSALON) 200 MG capsule Take 1 capsule (200 mg total) by mouth 2 (two) times daily as needed for cough. Swallow whole (Patient not taking: Reported on 09/24/2022) 30 capsule 1   predniSONE (DELTASONE) 10 MG tablet Take 3 pills once daily by mouth for 3 days, then 2 pills once daily for 3 days, then 1 pill once daily for 3 days and then stop (Patient not taking: Reported on 09/04/2022) 18 tablet 0   promethazine-dextromethorphan (PROMETHAZINE-DM) 6.25-15 MG/5ML syrup Take 5 mLs by mouth 3 (three) times daily as needed for cough. (Patient not taking: Reported on 09/04/2022) 180 mL 0   No current facility-administered medications on file prior to visit.    BP 136/80   Pulse 95   Temp 98.2 F (36.8 C) (Temporal)   Ht '5\' 2"'$  (1.575 m)   Wt 159 lb (72.1 kg)   SpO2 98%   BMI 29.08 kg/m  Objective:   Physical Exam Cardiovascular:     Rate and Rhythm: Normal rate.  Pulmonary:     Effort: Pulmonary effort is normal.  Abdominal:     General: Bowel sounds are increased.     Palpations: Abdomen is soft.     Tenderness: There is no abdominal tenderness.  Musculoskeletal:     Cervical back: Neck supple.   Skin:    General: Skin is warm and dry.           Assessment & Plan:  Intermittent diarrhea Assessment & Plan: Viral vs. Bacterial gastroenteritis. Less likely inflammatory bowel.  Fortunately, symptoms are improving. Continue Gatorade, Pedialyte.   Labs today pending including CBC with diff, BMP pending. Stool studies pending. She will return if diarrhea resumes.    Reviewed colonoscopy from 2014. Patient will discuss with PCP about a referral for next colonoscopy.  Return precautions provided. Await results.  I evaluated patient, was consulted regarding treatment, and agree with assessment and plan per Tinnie Gens, RN, DNP student.   Allie Bossier, NP-C   Orders: -     CBC with Differential/Platelet -     Basic metabolic panel -     C. difficile GDH and Toxin A/B; Future -     Gastrointestinal Pathogen Pnl RT, PCR; Future -     Giardia antigen; Future        Pleas Koch, NP
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
## 2022-11-26 ENCOUNTER — Other Ambulatory Visit (INDEPENDENT_AMBULATORY_CARE_PROVIDER_SITE_OTHER): Payer: BC Managed Care – PPO

## 2022-11-26 ENCOUNTER — Telehealth (INDEPENDENT_AMBULATORY_CARE_PROVIDER_SITE_OTHER): Payer: BC Managed Care – PPO | Admitting: Family Medicine

## 2022-11-26 DIAGNOSIS — I10 Essential (primary) hypertension: Secondary | ICD-10-CM | POA: Diagnosis not present

## 2022-11-26 DIAGNOSIS — E7800 Pure hypercholesterolemia, unspecified: Secondary | ICD-10-CM | POA: Diagnosis not present

## 2022-11-26 DIAGNOSIS — R7303 Prediabetes: Secondary | ICD-10-CM

## 2022-11-26 LAB — CBC WITH DIFFERENTIAL/PLATELET
Basophils Absolute: 0 10*3/uL (ref 0.0–0.1)
Basophils Relative: 0.4 % (ref 0.0–3.0)
Eosinophils Absolute: 0.1 10*3/uL (ref 0.0–0.7)
Eosinophils Relative: 1.1 % (ref 0.0–5.0)
HCT: 42 % (ref 36.0–46.0)
Hemoglobin: 14.3 g/dL (ref 12.0–15.0)
Lymphocytes Relative: 25.9 % (ref 12.0–46.0)
Lymphs Abs: 2.5 10*3/uL (ref 0.7–4.0)
MCHC: 33.9 g/dL (ref 30.0–36.0)
MCV: 97.9 fl (ref 78.0–100.0)
Monocytes Absolute: 0.9 10*3/uL (ref 0.1–1.0)
Monocytes Relative: 9 % (ref 3.0–12.0)
Neutro Abs: 6.1 10*3/uL (ref 1.4–7.7)
Neutrophils Relative %: 63.6 % (ref 43.0–77.0)
Platelets: 226 10*3/uL (ref 150.0–400.0)
RBC: 4.3 Mil/uL (ref 3.87–5.11)
RDW: 13 % (ref 11.5–15.5)
WBC: 9.7 10*3/uL (ref 4.0–10.5)

## 2022-11-26 LAB — COMPREHENSIVE METABOLIC PANEL
ALT: 15 U/L (ref 0–35)
AST: 15 U/L (ref 0–37)
Albumin: 4 g/dL (ref 3.5–5.2)
Alkaline Phosphatase: 57 U/L (ref 39–117)
BUN: 21 mg/dL (ref 6–23)
CO2: 29 mEq/L (ref 19–32)
Calcium: 9.2 mg/dL (ref 8.4–10.5)
Chloride: 105 mEq/L (ref 96–112)
Creatinine, Ser: 0.66 mg/dL (ref 0.40–1.20)
GFR: 94.19 mL/min (ref >60.00)
Glucose, Bld: 101 mg/dL — ABNORMAL HIGH (ref 70–99)
Potassium: 4.2 mEq/L (ref 3.5–5.1)
Sodium: 139 mEq/L (ref 135–145)
Total Bilirubin: 0.6 mg/dL (ref 0.2–1.2)
Total Protein: 5.9 g/dL — ABNORMAL LOW (ref 6.0–8.3)

## 2022-11-26 LAB — LIPID PANEL
Cholesterol: 135 mg/dL (ref 0–200)
HDL: 51.2 mg/dL (ref >39.00)
LDL Cholesterol: 71 mg/dL (ref 0–99)
NonHDL: 83.44
Total CHOL/HDL Ratio: 3
Triglycerides: 64 mg/dL (ref 0.0–149.0)
VLDL: 12.8 mg/dL (ref 0.0–40.0)

## 2022-11-26 LAB — TSH: TSH: 2.93 u[IU]/mL (ref 0.35–5.50)

## 2022-11-26 LAB — HEMOGLOBIN A1C: Hgb A1c MFr Bld: 5.9 % (ref 4.6–6.5)

## 2022-11-26 NOTE — Telephone Encounter (Signed)
Lab orders

## 2022-12-04 ENCOUNTER — Ambulatory Visit (INDEPENDENT_AMBULATORY_CARE_PROVIDER_SITE_OTHER): Payer: BC Managed Care – PPO | Admitting: Family Medicine

## 2022-12-04 ENCOUNTER — Other Ambulatory Visit (HOSPITAL_COMMUNITY)
Admission: RE | Admit: 2022-12-04 | Discharge: 2022-12-04 | Disposition: A | Discharge status: Home / Self Care | Payer: BC Managed Care – PPO | Source: Ambulatory Visit | Attending: Family Medicine | Admitting: Family Medicine

## 2022-12-04 ENCOUNTER — Encounter: Payer: Self-pay | Admitting: Family Medicine

## 2022-12-04 VITALS — BP 128/70 | HR 78 | Temp 97.6°F | Ht 62.0 in | Wt 160.4 lb

## 2022-12-04 DIAGNOSIS — E7800 Pure hypercholesterolemia, unspecified: Secondary | ICD-10-CM

## 2022-12-04 DIAGNOSIS — I10 Essential (primary) hypertension: Secondary | ICD-10-CM | POA: Diagnosis not present

## 2022-12-04 DIAGNOSIS — M8589 Other specified disorders of bone density and structure, multiple sites: Secondary | ICD-10-CM | POA: Diagnosis not present

## 2022-12-04 DIAGNOSIS — Z0000 Encounter for general adult medical examination without abnormal findings: Secondary | ICD-10-CM

## 2022-12-04 DIAGNOSIS — G620 Drug-induced polyneuropathy: Secondary | ICD-10-CM

## 2022-12-04 DIAGNOSIS — Z01419 Encounter for gynecological examination (general) (routine) without abnormal findings: Secondary | ICD-10-CM | POA: Insufficient documentation

## 2022-12-04 DIAGNOSIS — E559 Vitamin D deficiency, unspecified: Secondary | ICD-10-CM

## 2022-12-04 DIAGNOSIS — R7303 Prediabetes: Secondary | ICD-10-CM

## 2022-12-04 DIAGNOSIS — Z79899 Other long term (current) drug therapy: Secondary | ICD-10-CM

## 2022-12-04 DIAGNOSIS — F341 Dysthymic disorder: Secondary | ICD-10-CM

## 2022-12-04 DIAGNOSIS — F17200 Nicotine dependence, unspecified, uncomplicated: Secondary | ICD-10-CM

## 2022-12-04 DIAGNOSIS — Z1211 Encounter for screening for malignant neoplasm of colon: Secondary | ICD-10-CM

## 2022-12-04 DIAGNOSIS — E2839 Other primary ovarian failure: Secondary | ICD-10-CM

## 2022-12-04 MED ORDER — NICOTINE 14 MG/24HR TD PT24: 14.0000 mg | MEDICATED_PATCH | Freq: Every day | TRANSDERMAL | 1 refills | Status: DC

## 2022-12-04 NOTE — Assessment & Plan Note (Signed)
Exam and pap done No complaints H/o breast cancer

## 2022-12-04 NOTE — Assessment & Plan Note (Signed)
No clinical changes 

## 2022-12-04 NOTE — Assessment & Plan Note (Signed)
Enc strongly to be more compliant with vit D3 At least 2000 iu daily   Disc imp to bone and overall health

## 2022-12-04 NOTE — Patient Instructions (Addendum)
Get back on the calcium plus D for your bones  Make sure you get at least 2000 iu of vitamin D3 every day   Also get vitamin B12 over the counter - make sure you get 1000 mcg daily  Continue B complex    Let's try nicotine patch to quit smoking  Don't smoke with the patch on    Call to schedule your colonoscopy   [facility]  [phone]  Get your dental work done Then call your insurance about the coverage of Reclast - let your oncologist know   Let your oncologist know when you are ready to go forward with the lung cancer screening program    Watch the sugar to prevent diabetes Try to get most of your carbohydrates from produce (with the exception of white potatoes)  Eat less bread/pasta/rice/snack foods/cereals/sweets and other items from the middle of the grocery store (processed carbs)  For cholesterol Avoid red meat/ fried foods/ egg yolks/ fatty breakfast meats/ butter, cheese and high fat dairy/ and shellfish

## 2022-12-04 NOTE — Assessment & Plan Note (Signed)
Dexa 07/2021  Sees onc and taking arimidex Taking ca and D-enc better compliance Some weight bearing /strength gaining activity with job/ no time for more One fall/ no recent fx  Goal to get dental problems treated and then check on coverage of reclast  She is working with onc for this

## 2022-12-04 NOTE — Assessment & Plan Note (Signed)
Enc pt to take her ca and D for bone health  Also B complex and B12

## 2022-12-04 NOTE — Assessment & Plan Note (Signed)
bp in fair control at this time  BP Readings from Last 1 Encounters:  12/04/22 128/70  improved with addn of losartan 25 mg daily   No changes needed Most recent labs reviewed  Disc lifstyle change with low sodium diet and exercise  Strongly enc to quit smoking

## 2022-12-04 NOTE — Assessment & Plan Note (Addendum)
Disc in detail risks of smoking and possible outcomes including copd, vascular/ heart disease, cancer , respiratory and sinus infections  Pt voices understanding  Sent in patches 14 mg to try  She has non nicotine vape -has not used   She declines lung cancer screen program at this time

## 2022-12-04 NOTE — Assessment & Plan Note (Signed)
Stable Has stressors  Fairly good mood today  Has had counseling in past

## 2022-12-04 NOTE — Assessment & Plan Note (Signed)
Reviewed health habits including diet and exercise and skin cancer prevention Reviewed appropriate screening tests for age  Also reviewed health mt list, fam hx and immunization status , as well as social and family history   See HPI Labs reviewed and ordered GI ref done for colonoscopy  Pap and gyn exam done today  Mammogram utd in setting of personal breast cancer  Dexa utd 07/2021   no fractures  Disc imp of vit D intake  Disc plan to quit smoking with patches Pt declines lung cancer screening program  PHq is improved

## 2022-12-04 NOTE — Assessment & Plan Note (Signed)
GI ref done for screening colonoscopy  Pt will call to schedule

## 2022-12-04 NOTE — Assessment & Plan Note (Signed)
Disc goals for lipids and reasons to control them Rev last labs with pt Rev low sat fat diet in detail  Some improvement with better compliance with atorvastatin 10 mg daily LDL of 71

## 2022-12-04 NOTE — Progress Notes (Signed)
Subjective:    Patient ID: Tonye Becket, female    DOB: March 12, 1961, 62 y.o.   MRN: [id number]  HPI Here for health maintenance exam and to review chronic medical problems   Wt Readings from Last 3 Encounters:  12/04/22 160 lb 6 oz (72.7 kg)  09/24/22 159 lb (72.1 kg)  09/04/22 162 lb 4.8 oz (73.6 kg)   29.33 kg/m  Vitals:   12/04/22 0921  BP: 128/70  Pulse: 78  Temp: 97.6 F (36.4 C)  SpO2: 98%   Working a lot  Feeling pretty good most of the time but tired all the time  Looking forward to get off arimidex   Gets worn out easily  Sleep is not great    Immunization History  Administered Date(s) Administered   Influenza Split 09/01/2012   Influenza,inj,Quad PF,6+ Mos 07/30/2017, 05/05/2019, 08/30/2020   Moderna Sars-Covid-2 Vaccination 10/28/2019, 11/29/2019   Pneumococcal Polysaccharide-23 07/30/2017   Td 10/25/2002   Tdap 09/01/2012, 11/16/2021   Zoster Recombinat (Shingrix) 04/17/2018   Health Maintenance Due  Topic Date Due   COVID-19 Vaccine (3 - Moderna risk series) 12/27/2019   COLONOSCOPY (Pts 45-35yrs Insurance coverage will need to be confirmed)  10/20/2022   Colonoscopy 09/2012 with 10 y recall Wants to schedule - has seen Dr Servando Snare for ERCP in past   Mammogram 07/2022 with personal h/o breast cancer  Takes arimidex until end of 2024  Self breast exam: no lumps or changes / stays sore on the one side Has lymphedema   Pap 01/2018  nl with neg HPV  Wants to get done today     Dexa  07/2021   osteopenia   (was osteoporosis in 2018)  Followed by onc  She declined oral bisphosphonate but did discuss poss reclast (per onc note she was going to check with ins and also get dental clearance)  Falls - last December fell off couch when she stood on it to put up xmas decorations  No injury/ no fractures  Fractures: none Supplements : ca and D  Exercise : active job/constantly moving  Cannot add more  She lifts and pushes and pulls all day    .Last  vitamin D Lab Results  Component Value Date   VD25OH 14 (L) 11/02/2013    Smoking status :  about 1ppd Wants to quit Wants to try patches again  Bought a vape with no nicotine to put in mouth if she feels the need   Lung cancer screening : declines She d/w her onc    Mood  Stable  .    12/04/2022   10:08 AM 09/24/2022    9:01 AM 11/27/2021   11:39 AM 09/29/2020   11:23 AM 05/05/2019   12:32 PM  Depression screen PHQ 2/9  Decreased Interest 0 1 1 0 0  Down, Depressed, Hopeless 1 1 1  0 0  PHQ - 2 Score 1 2 2  0 0  Altered sleeping 1 2 3 1  0  Tired, decreased energy 2 2 3 3 1   Change in appetite 1 2 0 0 0  Feeling bad or failure about yourself  0 2 0 0 0  Trouble concentrating 1 2 0 0 0  Moving slowly or fidgety/restless 1 1 0 0 0  Suicidal thoughts 0 0 0 0 0  PHQ-9 Score 7 13 8 4 1   Difficult doing work/chores Somewhat difficult Somewhat difficult Not difficult at all Not difficult at all Somewhat difficult  HTN bp is stable today  No cp or palpitations or headaches or edema  No side effects to medicines  BP Readings from Last 3 Encounters:  12/04/22 128/70  09/24/22 136/80  09/04/22 (!) 150/82     Losartan 25 mg daily   Last metabolic panel Lab Results  Component Value Date   GLUCOSE 101 (H) 11/26/2022   NA 139 11/26/2022   K 4.2 11/26/2022   CL 105 11/26/2022   CO2 29 11/26/2022   BUN 21 11/26/2022   CREATININE 0.66 11/26/2022   GFRNONAA >60 09/04/2022   CALCIUM 9.2 11/26/2022   PHOS 4.9 (H) 09/16/2006   PROT 5.9 (L) 11/26/2022   ALBUMIN 4.0 11/26/2022   BILITOT 0.6 11/26/2022   ALKPHOS 57 11/26/2022   AST 15 11/26/2022   ALT 15 11/26/2022   ANIONGAP 8 09/04/2022   GFR of 94  Has some chronic R muscular pain in back   GERD Takes omeprazole 40 mg daily  Takes B complex  Needs to be better about D  On mvi for women over 50 Also probiotics     Prediabetes Lab Results  Component Value Date   HGBA1C 5.9 11/26/2022    Stable  Too  much sugar lately    Lab Results  Component Value Date   WBC 9.7 11/26/2022   HGB 14.3 11/26/2022   HCT 42.0 11/26/2022   MCV 97.9 11/26/2022   PLT 226.0 11/26/2022    Hyperlipidemia Lab Results  Component Value Date   CHOL 135 11/26/2022   CHOL 159 11/23/2021   CHOL 150 09/22/2020   Lab Results  Component Value Date   HDL 51.20 11/26/2022   HDL 48.20 11/23/2021   HDL 47.80 09/22/2020   Lab Results  Component Value Date   LDLCALC 71 11/26/2022   LDLCALC 97 11/23/2021   LDLCALC 84 09/22/2020   Lab Results  Component Value Date   TRIG 64.0 11/26/2022   TRIG 69.0 11/23/2021   TRIG 94.0 09/22/2020   Lab Results  Component Value Date   CHOLHDL 3 11/26/2022   CHOLHDL 3 11/23/2021   CHOLHDL 3 09/22/2020   Lab Results  Component Value Date   LDLDIRECT 130.3 01/26/2010   Atorvastatin 10 mg daily  Does watch diet   Patient Active Problem List   Diagnosis Date Noted   Estrogen deficiency 12/04/2022   Vitamin D deficiency 12/04/2022   Current use of proton pump inhibitor 12/04/2022   Intermittent diarrhea 09/24/2022   Osteoporosis, post-menopausal 09/04/2022   Smokers' cough 11/27/2021   Varicose veins with inflammation 07/30/2021   Encounter for adjustment or removal of myringotomy device (stent) (tube)    Osteopenia 08/03/2019   Drug-induced polyneuropathy 05/05/2019   Prediabetes 02/03/2018   Carcinoma of overlapping sites of left breast in female, estrogen receptor positive 07/29/2016   Lymphedema 10/23/2015   Carpal tunnel syndrome 10/23/2015   Encounter for routine gynecological examination 02/10/2015   Routine general medical examination at a health care facility 02/07/2015   Degenerative disc disease, lumbar 02/04/2014   Anterolisthesis 02/04/2014   Postmenopausal estrogen deficiency 09/02/2013   History of thrombocytopenia 01/06/2013   Colon cancer screening 09/01/2012   History of CVA (cerebrovascular accident) 04/17/2012   Pure  hypercholesterolemia 04/05/2008   Essential hypertension 03/30/2008   ANXIETY DEPRESSION 11/17/2007   TOBACCO USE 11/17/2007   Chronic back pain 09/23/2007   Migraine headache 06/26/2000   Past Medical History:  Diagnosis Date   Anxiety    Anxiety and depression    Barrett's  esophageal ulceration    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   BREAST LUMPECTOMY Bilateral 2014   bilateral breast ca   BTL     CHOLECYSTECTOMY  01/2021   COLONOSCOPY     epidural steroid injection  2008   ERCP N/A 01/29/2021   Procedure: ENDOSCOPIC RETROGRADE CHOLANGIOPANCREATOGRAPHY (ERCP);  Surgeon: Midge Minium, MD;  Location[facility] ENDOSCOPY;  Service: Endoscopy;  Laterality: N/A;   ERCP N/A 04/03/2021   Procedure: ENDOSCOPIC RETROGRADE CHOLANGIOPANCREATOGRAPHY (ERCP);  Surgeon: Midge Minium, MD;  Location: [facility] ENDOSCOPY;  Service: Endoscopy;  Laterality: N/A;   LUMBAR FUSION  03/2016   L4-5   PARTIAL MASTECTOMY WITH NEEDLE LOCALIZATION AND AXILLARY SENTINEL LYMPH NODE BX Bilateral 11/23/2012   Procedure: PARTIAL MASTECTOMY WITH NEEDLE LOCALIZATION AND AXILLARY SENTINEL LYMPH NODE BX;  Surgeon: Ernestene Mention, MD;  Location: [facility];   Service: General;  Laterality: Bilateral;   PORT-A-CATH REMOVAL Right 08/25/2013   Procedure: REMOVAL PORT-A-CATH;  Surgeon: Ernestene Mention, MD;  Location: [facility];  Service: General;  Laterality: Right;   PORTACATH PLACEMENT Right 11/23/2012   Procedure: INSERTION PORT-A-CATH;  Surgeon: Ernestene Mention, MD;  Location: [facility];  Service: General;  Laterality: Right;   TUBAL LIGATION     UPPER GI ENDOSCOPY     Social History   Tobacco Use   Smoking status: Every Day    Packs/day: 0.50    Years: 33.00    Additional pack years: 0.00    Total pack years: 16.50    Types: Cigarettes   Smokeless tobacco: Never  Vaping Use   Vaping Use: Never used  Substance Use Topics   Alcohol use: Yes    Alcohol/week: 0.0 standard drinks of alcohol    Comment: beer rarely   Drug use: No   Family History  Problem Relation Age of Onset   Stroke Maternal Grandmother    Diabetes Maternal Grandmother    Diabetes Mother    Hypertension Mother    Leukemia Mother    Obesity Mother    Depression Sister    Bipolar disorder Son        Bipolar / oppositional defiant   Alcohol abuse Sister    Drug abuse Sister    Alcohol abuse Sister    Drug abuse Sister    Alcohol abuse Brother    Drug abuse Brother    Pancreatic cancer Brother    Colon cancer Neg Hx    Breast cancer Neg Hx    Allergies  Allergen Reactions   Gabapentin Swelling   Lyrica [Pregabalin] Other (See Comments)    sedated   Voltaren [Diclofenac Sodium] Nausea And Vomiting   Bupropion Hcl Nausea Only and Other (See Comments)     GI, sleepy   Chlorhexidine Gluconate Itching and Rash   Hydrocod Poli-Chlorphe Poli Er Nausea And Vomiting    vomiting   Lisinopril Other (See Comments)    Leg cramps     Naproxen Sodium Other (See Comments)    does not tolerate   Current Outpatient Medications on File Prior to Visit  Medication Sig Dispense Refill   anastrozole (ARIMIDEX) 1 MG tablet TAKE 1  TABLET DAILY 90 tablet 0   aspirin 81 MG chewable tablet Chew 1 tablet (81 mg total) by mouth daily. 90 tablet 3   atorvastatin (LIPITOR) 10 MG tablet Take 1 tablet (10 mg total) by mouth daily. 90 tablet 3   b complex vitamins tablet Take 2 tablets by mouth daily.     calcium carbonate (TUMS - DOSED IN MG ELEMENTAL CALCIUM) 500 MG chewable tablet Chew by mouth as directed.     Calcium Carbonate-Vitamin D (CALCIUM + D PO) Take 1 tablet by mouth daily.     ipratropium (ATROVENT) 0.03 % nasal spray Place 2 sprays into both nostrils every 12 (twelve) hours.     losartan (COZAAR) 25 MG tablet Take 1 tablet (25 mg total) by mouth daily. 90 tablet 3   meloxicam (MOBIC) 15 MG tablet TAKE 1 TABLET DAILY. 90 tablet 1   Multiple Vitamins-Minerals (MULTIVITAMIN WOMEN 50+ PO) Take 1 tablet by mouth daily.     omeprazole (PRILOSEC) 40 MG capsule Take 1 capsule (40 mg total) by mouth daily. 90 capsule 0   Probiotic Product (PROBIOTIC DAILY PO) Take 2 tablets by mouth daily.     No current facility-administered medications on file prior to visit.     Review of Systems  Constitutional:  Positive for fatigue. Negative for activity change, appetite change, fever and unexpected weight change.  HENT:  Negative for congestion, ear pain, rhinorrhea, sinus pressure and sore throat.   Eyes:  Negative for pain, redness and visual disturbance.  Respiratory:  Positive for cough. Negative for shortness of breath and wheezing.   Cardiovascular:  Negative for chest pain and palpitations.  Gastrointestinal:  Negative for abdominal pain, blood in stool, constipation and diarrhea.  Endocrine: Negative for polydipsia and polyuria.  Genitourinary:  Negative for dysuria, frequency and urgency.  Musculoskeletal:  Positive for arthralgias and back pain. Negative for myalgias.  Skin:  Negative for pallor and rash.  Allergic/Immunologic: Negative for environmental allergies.  Neurological:  Negative for dizziness, syncope and  headaches.  Hematological:  Negative for adenopathy. Does not bruise/bleed easily.  Psychiatric/Behavioral:  Negative for decreased concentration and dysphoric mood. The patient is not nervous/anxious.        Stressors       Objective:   Physical Exam Constitutional:      General: She is not in acute distress.    Appearance: Normal appearance. She is well-developed. She is obese. She is not ill-appearing or diaphoretic.  HENT:     Head: Normocephalic and atraumatic.     Right Ear: Tympanic membrane, ear canal and external ear normal.     Left Ear: Tympanic membrane, ear canal and external ear normal.     Nose: Nose normal. No congestion.     Comments: Boggy nares    Mouth/Throat:     Mouth: Mucous membranes are  moist.     Pharynx: Oropharynx is clear. No posterior oropharyngeal erythema.  Eyes:     General: No scleral icterus.    Extraocular Movements: Extraocular movements intact.     Conjunctiva/sclera: Conjunctivae normal.     Pupils: Pupils are equal, round, and reactive to light.  Neck:     Thyroid: No thyromegaly.     Vascular: No carotid bruit or JVD.  Cardiovascular:     Rate and Rhythm: Normal rate and regular rhythm.     Pulses: Normal pulses.     Heart sounds: Normal heart sounds.     No gallop.  Pulmonary:     Effort: Pulmonary effort is normal. No respiratory distress.     Breath sounds: Normal breath sounds. No wheezing.     Comments: Diffusely distant bs  Chest:     Chest wall: No tenderness.  Abdominal:     General: Bowel sounds are normal. There is no distension or abdominal bruit.     Palpations: Abdomen is soft. There is no mass.     Tenderness: There is no abdominal tenderness.     Hernia: No hernia is present.  Genitourinary:    Comments: Breast exam: No mass, nodules, thickening, tenderness, bulging, retraction, inflamation, nipple discharge or skin changes noted.  No axillary or clavicular LA.     Baseline scarring noted from past breast cancer  surgery                Anus appears normal w/o hemorrhoids or masses       External genitalia : nl appearance and hair distribution/no lesions       Urethral meatus : nl size, no lesions or prolapse       Urethra: no masses, tenderness or scarring      Bladder : no masses or tenderness       Vagina: nl general appearance, no discharge or  Lesions, no significant cystocele  or rectocele       Cervix: no lesions/ discharge or friability      Uterus: nl size, contour, position, and mobility (not fixed) , non tender      Adnexa : no masses, tenderness, enlargement or nodularity          Musculoskeletal:        General: No tenderness. Normal range of motion.     Cervical back: Normal range of motion and neck supple. No rigidity. No muscular tenderness.     Right lower leg: No edema.     Left lower leg: No edema.     Comments: No kyphosis   Lymphadenopathy:     Cervical: No cervical adenopathy.  Skin:    General: Skin is warm and dry.     Coloration: Skin is not pale.     Findings: No erythema or rash.     Comments: Solar lentigines diffusely   Neurological:     Mental Status: She is alert. Mental status is at baseline.     Cranial Nerves: No cranial nerve deficit.     Motor: No abnormal muscle tone.     Coordination: Coordination normal.     Gait: Gait normal.     Deep Tendon Reflexes: Reflexes are normal and symmetric. Reflexes normal.  Psychiatric:        Mood and Affect: Mood normal.        Cognition and Memory: Cognition and memory normal.           Assessment & Plan:   Problem  List Items Addressed This Visit       Cardiovascular and Mediastinum   Essential hypertension (Chronic)    bp in fair control at this time  BP Readings from Last 1 Encounters:  12/04/22 128/70  improved with addn of losartan 25 mg daily   No changes needed Most recent labs reviewed  Disc lifstyle change with low sodium diet and exercise  Strongly enc to quit smoking         Nervous and Auditory   Drug-induced polyneuropathy    No clinical changes       Relevant Medications   nicotine (NICODERM CQ - DOSED IN MG/24 HOURS) 14 mg/24hr patch     Musculoskeletal and Integument   Osteopenia    Dexa 07/2021  Sees onc and taking arimidex Taking ca and D-enc better compliance Some weight bearing /strength gaining activity with job/ no time for more One fall/ no recent fx  Goal to get dental problems treated and then check on coverage of reclast  She is working with onc for this        Other   ANXIETY DEPRESSION    Stable Has stressors  Fairly good mood today  Has had counseling in past       Colon cancer screening    GI ref done for screening colonoscopy  Pt will call to schedule      Relevant Orders   Ambulatory referral to Gastroenterology   Current use of proton pump inhibitor    Enc pt to take her ca and D for bone health  Also B complex and B12      Encounter for routine gynecological examination    Exam and pap done No complaints H/o breast cancer       Relevant Orders   Cytology - PAP([facility])   Estrogen deficiency   Prediabetes    Lab Results  Component Value Date   HGBA1C 5.9 11/26/2022  disc imp of low glycemic diet and wt loss to prevent DM2       Pure hypercholesterolemia    Disc goals for lipids and reasons to control them Rev last labs with pt Rev low sat fat diet in detail  Some improvement with better compliance with atorvastatin 10 mg daily LDL of 71      Routine general medical examination at a health care facility - Primary    Reviewed health habits including diet and exercise and skin cancer prevention Reviewed appropriate screening tests for age  Also reviewed health mt list, fam hx and immunization status , as well as social and family history   See HPI Labs reviewed and ordered GI ref done for colonoscopy  Pap and gyn exam done today  Mammogram utd in setting of personal breast cancer  Dexa utd  07/2021   no fractures  Disc imp of vit D intake  Disc plan to quit smoking with patches Pt declines lung cancer screening program  PHq is improved       TOBACCO USE    Disc in detail risks of smoking and possible outcomes including copd, vascular/ heart disease, cancer , respiratory and sinus infections  Pt voices understanding  Sent in patches 14 mg to try  She has non nicotine vape -has not used   She declines lung cancer screen program at this time        Vitamin D deficiency    Enc strongly to be more compliant with vit D3 At least 2000 iu daily  Disc imp to bone and overall health

## 2022-12-04 NOTE — Assessment & Plan Note (Signed)
Lab Results  Component Value Date   HGBA1C 5.9 11/26/2022   disc imp of low glycemic diet and wt loss to prevent DM2

## 2022-12-09 ENCOUNTER — Other Ambulatory Visit: Payer: Self-pay | Admitting: Internal Medicine

## 2022-12-09 ENCOUNTER — Other Ambulatory Visit: Payer: Self-pay | Admitting: Family Medicine

## 2022-12-09 LAB — CYTOLOGY - PAP
Comment: NEGATIVE
Diagnosis: NEGATIVE
High risk HPV: NEGATIVE

## 2022-12-10 ENCOUNTER — Encounter: Payer: Self-pay | Admitting: *Deleted

## 2022-12-12 ENCOUNTER — Encounter: Payer: Self-pay | Admitting: *Deleted

## 2023-02-10 ENCOUNTER — Other Ambulatory Visit: Payer: Self-pay | Admitting: Family Medicine

## 2023-02-11 NOTE — Telephone Encounter (Signed)
Last filled on 12/04/22 #28 patches/ 1 refill, CPE was also on 12/04/22

## 2023-02-20 ENCOUNTER — Ambulatory Visit: Payer: BC Managed Care – PPO | Admitting: Family Medicine

## 2023-02-20 ENCOUNTER — Encounter: Payer: Self-pay | Admitting: Family Medicine

## 2023-02-20 VITALS — BP 130/80 | HR 95 | Temp 97.6°F | Ht 62.0 in | Wt 158.4 lb

## 2023-02-20 DIAGNOSIS — F17200 Nicotine dependence, unspecified, uncomplicated: Secondary | ICD-10-CM | POA: Diagnosis not present

## 2023-02-20 DIAGNOSIS — J069 Acute upper respiratory infection, unspecified: Secondary | ICD-10-CM | POA: Diagnosis not present

## 2023-02-20 DIAGNOSIS — H6990 Unspecified Eustachian tube disorder, unspecified ear: Secondary | ICD-10-CM | POA: Insufficient documentation

## 2023-02-20 DIAGNOSIS — H6993 Unspecified Eustachian tube disorder, bilateral: Secondary | ICD-10-CM

## 2023-02-20 MED ORDER — PREDNISONE 20 MG PO TABS
20.0000 mg | ORAL_TABLET | Freq: Every day | ORAL | 0 refills | Status: DC
Start: 2023-02-20 — End: 2023-05-01

## 2023-02-20 NOTE — Patient Instructions (Signed)
Continue the atrovent nasal spray as ordered  Breathe steam  Stay hydrated  Take the prednisone 20 mg daily for 5 days   Update if not starting to improve in a week or if worsening    If you get fever/ sinus pain or worse ear pain let me know   Keep thinking about quitting smoking and use your patches

## 2023-02-20 NOTE — Progress Notes (Signed)
Subjective:    Patient ID: Amy Leonard, female    DOB: 08-21-1961, 62 y.o.   MRN: [id number]  HPI  Wt Readings from Last 3 Encounters:  02/20/23 158 lb 6 oz (71.8 kg)  12/04/22 160 lb 6 oz (72.7 kg)  09/24/22 159 lb (72.1 kg)   28.97 kg/m  Vitals:   02/20/23 1200 02/20/23 1234  BP: (!) 148/80 130/80  Pulse: 95   Temp: 97.6 F (36.4 C)   SpO2: 96%     Pt presents for uri symptoms including nasal congestion and ear fullness   She did an e visit while out of town [facility])  Given augmentin (? Sinus infection)  [place] Peers improved but still has ear/sinus symptoms   2 covid tests were negative    Both ears are very clogged  Sometimes hurt  Ears pop   Still some congestion and cough   Blowing nose - mostly clear/ some yellow  Phlegm from chest is white   Not wheezing  Not tight   Some over the counter meds in [place] quil and day quil and mucinex   Ipratropium nasal spray    Smoking status    Patient Active Problem List   Diagnosis Date Noted   ETD (eustachian tube dysfunction) 02/20/2023   Estrogen deficiency 12/04/2022   Vitamin D deficiency 12/04/2022   Current use of proton pump inhibitor 12/04/2022   Intermittent diarrhea 09/24/2022   Osteoporosis, post-menopausal 09/04/2022   Smokers' cough (HCC) 11/27/2021   Varicose veins with inflammation 07/30/2021   Encounter for adjustment or removal of myringotomy device (stent) (tube)    Osteopenia 08/03/2019   Drug-induced polyneuropathy (HCC) 05/05/2019   Prediabetes 02/03/2018   Carcinoma of overlapping sites of left breast in female, estrogen receptor positive (HCC) 07/29/2016   Lymphedema 10/23/2015   Carpal tunnel syndrome 10/23/2015   Encounter for routine gynecological examination 02/10/2015   Routine general medical examination at a health care facility 02/07/2015   Degenerative disc disease, lumbar 02/04/2014   Anterolisthesis 02/04/2014   Postmenopausal estrogen deficiency 09/02/2013    History of thrombocytopenia 01/06/2013   Colon cancer screening 09/01/2012   History of CVA (cerebrovascular accident) 04/17/2012   URI with cough and congestion 01/10/2012   Pure hypercholesterolemia 04/05/2008   Essential hypertension 03/30/2008   ANXIETY DEPRESSION 11/17/2007   TOBACCO USE 11/17/2007   Chronic back pain 09/23/2007   Migraine headache 06/26/2000   Past Medical History:  Diagnosis Date   Anxiety    Anxiety and depression    Barrett's esophageal ulceration    Basal ganglia infarction (HCC) 04/17/2012   Breast cancer (HCC) 2014   BILATERAL LUMPECTOMY   Carpal tunnel syndrome    Chronic back pain    spinal stenosis   Depression    Elevated WBC count    nl diff   Erosive gastritis    Folliculitis 05/05/2011   GERD (gastroesophageal reflux disease)    Glaucoma    ?   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   BREAST LUMPECTOMY Bilateral 2014   bilateral breast ca   BTL     CHOLECYSTECTOMY  01/2021   COLONOSCOPY     epidural steroid injection  2008   ERCP N/A 01/29/2021   Procedure: ENDOSCOPIC RETROGRADE CHOLANGIOPANCREATOGRAPHY (ERCP);  Surgeon: Midge Minium, MD;  Location: [facility] ENDOSCOPY;  Service: Endoscopy;  Laterality: N/A;   ERCP N/A 04/03/2021   Procedure: ENDOSCOPIC RETROGRADE CHOLANGIOPANCREATOGRAPHY (ERCP);  Surgeon: Midge Minium, MD;  Location: [facility] ENDOSCOPY;  Service: Endoscopy;  Laterality: N/A;   LUMBAR FUSION  03/2016   L4-5   PARTIAL MASTECTOMY WITH NEEDLE LOCALIZATION AND AXILLARY SENTINEL LYMPH NODE  BX Bilateral 11/23/2012   Procedure: PARTIAL MASTECTOMY WITH NEEDLE LOCALIZATION AND AXILLARY SENTINEL LYMPH NODE BX;  Surgeon: Ernestene Mention, MD;  Location: [facility];  Service: General;  Laterality: Bilateral;   PORT-A-CATH REMOVAL Right 08/25/2013   Procedure: REMOVAL PORT-A-CATH;  Surgeon: Ernestene Mention, MD;  Location: [facility];  Service: General;  Laterality: Right;   PORTACATH PLACEMENT Right 11/23/2012   Procedure: INSERTION PORT-A-CATH;  Surgeon: Ernestene Mention, MD;  Location: [facility];  Service: General;  Laterality: Right;   TUBAL LIGATION     UPPER GI ENDOSCOPY     Social History   Tobacco Use   Smoking status: Every Day    Packs/day: 0.50    Years: 33.00    Additional pack years: 0.00    Total pack years: 16.50    Types: Cigarettes   Smokeless tobacco: Never  Vaping Use   Vaping Use: Never used  Substance Use Topics   Alcohol use: Yes    Alcohol/week: 0.0 standard drinks of alcohol    Comment: beer rarely   Drug use: No   Family History  Problem Relation Age of Onset   Stroke Maternal Grandmother    Diabetes Maternal Grandmother    Diabetes Mother    Hypertension Mother    Leukemia Mother    Obesity Mother    Depression Sister    Bipolar disorder Son        Bipolar / oppositional defiant   Alcohol abuse Sister    Drug abuse Sister    Alcohol abuse Sister    Drug abuse Sister    Alcohol abuse Brother    Drug abuse Brother    Pancreatic cancer Brother    Colon cancer Neg Hx    Breast cancer Neg Hx    Allergies  Allergen Reactions   Gabapentin Swelling   Lyrica [Pregabalin] Other (See Comments)    sedated   Voltaren [Diclofenac Sodium] Nausea And Vomiting   Bupropion Hcl Nausea Only and Other (See Comments)     GI, sleepy   Chlorhexidine Gluconate Itching and Rash   Hydrocod Poli-Chlorphe Poli Er Nausea And Vomiting    vomiting   Lisinopril Other (See Comments)    Leg cramps     Naproxen  Sodium Other (See Comments)    does not tolerate   Current Outpatient Medications on File Prior to Visit  Medication Sig Dispense Refill   anastrozole (ARIMIDEX) 1 MG tablet TAKE 1 TABLET DAILY 90 tablet 0   aspirin 81 MG chewable tablet Chew 1 tablet (81 mg total) by mouth daily. 90 tablet 3   atorvastatin (LIPITOR) 10 MG tablet TAKE 1 TABLET DAILY 90 tablet 3   b complex vitamins tablet Take 2 tablets by mouth daily.  calcium carbonate (TUMS - DOSED IN MG ELEMENTAL CALCIUM) 500 MG chewable tablet Chew by mouth as directed.     Calcium Carbonate-Vitamin D (CALCIUM + D PO) Take 1 tablet by mouth daily.     ipratropium (ATROVENT) 0.03 % nasal spray Place 2 sprays into both nostrils every 12 (twelve) hours.     losartan (COZAAR) 25 MG tablet TAKE 1 TABLET DAILY 90 tablet 3   meloxicam (MOBIC) 15 MG tablet TAKE 1 TABLET DAILY. 90 tablet 1   Multiple Vitamins-Minerals (MULTIVITAMIN WOMEN 50+ PO) Take 1 tablet by mouth daily.     nicotine (NICODERM CQ - DOSED IN MG/24 HOURS) 14 mg/24hr patch PLACE 1 PATCH ONTO THE SKIN DAILY. 28 patch 2   omeprazole (PRILOSEC) 40 MG capsule Take 1 capsule (40 mg total) by mouth daily. 90 capsule 0   Probiotic Product (PROBIOTIC DAILY PO) Take 2 tablets by mouth daily.     No current facility-administered medications on file prior to visit.    Review of Systems  Constitutional:  Positive for fatigue. Negative for activity change, appetite change, fever and unexpected weight change.  HENT:  Positive for congestion, ear pain, postnasal drip and sinus pressure. Negative for ear discharge, facial swelling, hearing loss, rhinorrhea, sinus pain and sore throat.   Eyes:  Negative for pain, redness and visual disturbance.  Respiratory:  Positive for cough. Negative for shortness of breath and wheezing.   Cardiovascular:  Negative for chest pain and palpitations.  Gastrointestinal:  Negative for abdominal pain, blood in stool, constipation and diarrhea.  Endocrine:  Negative for polydipsia and polyuria.  Genitourinary:  Negative for dysuria, frequency and urgency.  Musculoskeletal:  Negative for arthralgias, back pain and myalgias.  Skin:  Negative for pallor and rash.  Allergic/Immunologic: Negative for environmental allergies.  Neurological:  Negative for dizziness, syncope and headaches.  Hematological:  Negative for adenopathy. Does not bruise/bleed easily.  Psychiatric/Behavioral:  Negative for decreased concentration and dysphoric mood. The patient is not nervous/anxious.        Objective:   Physical Exam Constitutional:      General: She is not in acute distress.    Appearance: Normal appearance. She is well-developed. She is obese. She is not ill-appearing, toxic-appearing or diaphoretic.  HENT:     Head: Normocephalic and atraumatic.     Comments: No facial tenderness     Right Ear: Ear canal and external ear normal.     Left Ear: Ear canal and external ear normal.     Ears:     Comments: Bilateral TMs dull with effusion  Pink /not red  in color      Nose: Congestion and rhinorrhea present.     Mouth/Throat:     Mouth: Mucous membranes are moist.     Pharynx: Oropharynx is clear. No oropharyngeal exudate or posterior oropharyngeal erythema.     Comments: Clear pnd  Eyes:     General:        Right eye: No discharge.        Left eye: No discharge.     Conjunctiva/sclera: Conjunctivae normal.     Pupils: Pupils are equal, round, and reactive to light.  Cardiovascular:     Rate and Rhythm: Normal rate.     Heart sounds: Normal heart sounds.  Pulmonary:     Effort: Pulmonary effort is normal. No respiratory distress.     Breath sounds: Normal breath sounds. No stridor. No wheezing, rhonchi or rales.     Comments: Diffusely  distant bs  Chest:     Chest wall: No tenderness.  Musculoskeletal:     Cervical back: Normal range of motion and neck supple.  Lymphadenopathy:     Cervical: No cervical adenopathy.  Skin:    General:  Skin is warm and dry.     Capillary Refill: Capillary refill takes less than 2 seconds.     Findings: No rash.  Neurological:     Mental Status: She is alert.     Cranial Nerves: No cranial nerve deficit.  Psychiatric:        Mood and Affect: Mood normal.           Assessment & Plan:   Problem List Items Addressed This Visit       Respiratory   URI with cough and congestion    S/p treatment with augmentin (e visit in [place]) presumably for sinusitis Current smoker Continues to suffer with ear pressure (s/p airplane) ETD on exam Prescription prednisone 20 mg for 5 d  Encouraged use of atrovent ns  Update if not starting to improve in a week or if worsening  ER precautions and signs and symptoms of bact OM reviewed Meds ordered this encounter  Medications   predniSONE (DELTASONE) 20 MG tablet    Sig: Take 1 tablet (20 mg total) by mouth daily with breakfast.    Dispense:  5 tablet    Refill:  0           Nervous and Auditory   ETD (eustachian tube dysfunction) - Primary    S/p uri (has had augmentin for sinusitis) in smoker  Recent air travel   Prescription prednisone (discussed side effects) Atrovent ns Update if not starting to improve in a week or if worsening   ER /call bacl precautions noted  Encouraged smoking cessation   Meds ordered this encounter  Medications   predniSONE (DELTASONE) 20 MG tablet    Sig: Take 1 tablet (20 mg total) by mouth daily with breakfast.    Dispense:  5 tablet    Refill:  0           Other   TOBACCO USE    With uri/ETD Almost ready to quit Disc in detail risks of smoking and possible outcomes including copd, vascular/ heart disease, cancer , respiratory and sinus infections  Pt voices understanding  Has patches to start with  Offered support as well

## 2023-02-20 NOTE — Assessment & Plan Note (Addendum)
S/p uri (has had augmentin for sinusitis) in smoker  Recent air travel   Prescription prednisone (discussed side effects) Atrovent ns Update if not starting to improve in a week or if worsening   ER /call bacl precautions noted  Encouraged smoking cessation   Meds ordered this encounter  Medications   predniSONE (DELTASONE) 20 MG tablet    Sig: Take 1 tablet (20 mg total) by mouth daily with breakfast.    Dispense:  5 tablet    Refill:  0

## 2023-02-20 NOTE — Assessment & Plan Note (Signed)
S/p treatment with augmentin (e visit in [place]) presumably for sinusitis Current smoker Continues to suffer with ear pressure (s/p airplane) ETD on exam Prescription prednisone 20 mg for 5 d  Encouraged use of atrovent ns  Update if not starting to improve in a week or if worsening  ER precautions and signs and symptoms of bact OM reviewed Meds ordered this encounter  Medications   predniSONE (DELTASONE) 20 MG tablet    Sig: Take 1 tablet (20 mg total) by mouth daily with breakfast.    Dispense:  5 tablet    Refill:  0

## 2023-02-20 NOTE — Assessment & Plan Note (Signed)
With uri/ETD Almost ready to quit Disc in detail risks of smoking and possible outcomes including copd, vascular/ heart disease, cancer , respiratory and sinus infections  Pt voices understanding  Has patches to start with  Offered support as well

## 2023-03-02 ENCOUNTER — Other Ambulatory Visit: Payer: Self-pay | Admitting: Internal Medicine

## 2023-03-03 ENCOUNTER — Encounter: Payer: Self-pay | Admitting: Internal Medicine

## 2023-05-01 ENCOUNTER — Encounter: Payer: Self-pay | Admitting: Family Medicine

## 2023-05-01 ENCOUNTER — Ambulatory Visit (INDEPENDENT_AMBULATORY_CARE_PROVIDER_SITE_OTHER)
Admission: RE | Admit: 2023-05-01 | Discharge: 2023-05-01 | Disposition: A | Discharge status: Home / Self Care | Payer: BC Managed Care – PPO | Source: Ambulatory Visit | Attending: Family Medicine | Admitting: Family Medicine

## 2023-05-01 ENCOUNTER — Ambulatory Visit: Payer: BC Managed Care – PPO | Admitting: Family Medicine

## 2023-05-01 VITALS — BP 132/80 | HR 94 | Temp 97.8°F | Ht 62.0 in | Wt 154.0 lb

## 2023-05-01 DIAGNOSIS — R059 Cough, unspecified: Secondary | ICD-10-CM

## 2023-05-01 DIAGNOSIS — J069 Acute upper respiratory infection, unspecified: Secondary | ICD-10-CM | POA: Diagnosis not present

## 2023-05-01 DIAGNOSIS — R062 Wheezing: Secondary | ICD-10-CM | POA: Diagnosis not present

## 2023-05-01 DIAGNOSIS — F17200 Nicotine dependence, unspecified, uncomplicated: Secondary | ICD-10-CM | POA: Diagnosis not present

## 2023-05-01 DIAGNOSIS — J209 Acute bronchitis, unspecified: Secondary | ICD-10-CM | POA: Diagnosis not present

## 2023-05-01 DIAGNOSIS — J410 Simple chronic bronchitis: Secondary | ICD-10-CM

## 2023-05-01 LAB — POC COVID19 BINAXNOW: SARS Coronavirus 2 Ag: NEGATIVE

## 2023-05-01 MED ORDER — BENZONATATE 200 MG PO CAPS: 200.0000 mg | ORAL_CAPSULE | Freq: Three times a day (TID) | ORAL | 1 refills | Status: DC | PRN

## 2023-05-01 MED ORDER — PREDNISONE 10 MG PO TABS: ORAL_TABLET | ORAL | 0 refills | Status: DC

## 2023-05-01 MED ORDER — ALBUTEROL SULFATE HFA 108 (90 BASE) MCG/ACT IN AERS: 2.0000 | INHALATION_SPRAY | RESPIRATORY_TRACT | 1 refills | Status: AC | PRN

## 2023-05-01 MED ORDER — PROMETHAZINE-DM 6.25-15 MG/5ML PO SYRP: 5.0000 mL | ORAL_SOLUTION | Freq: Four times a day (QID) | ORAL | 0 refills | Status: DC | PRN

## 2023-05-01 NOTE — Patient Instructions (Addendum)
Keep thinking about quitting smoking Use the patches that you have   Take prednisone as directed It will make you feel hyper and hungry   Drink lots of fluids   Take tessalon for cough  Promethazine dm for night time cough  Watch out for sedation    Chest xray now  We will contact you with results   Update if not starting to improve in a week or if worsening   If any severe symptoms -go to the ER   If the swallowing symptoms persist or worsen after this gets better please follow up for a visit

## 2023-05-01 NOTE — Progress Notes (Signed)
Subjective:    Patient ID: Amy Leonard, female    DOB: 1961/06/05, 62 y.o.   MRN: [id number]  HPI  Wt Readings from Last 3 Encounters:  05/01/23 154 lb (69.9 kg)  02/20/23 158 lb 6 oz (71.8 kg)  12/04/22 160 lb 6 oz (72.7 kg)   28.17 kg/m  Vitals:   05/01/23 1122 05/01/23 1155  BP: (!) 146/84 132/80  Pulse: 94   Temp: 97.8 F (36.6 C)   SpO2: 94%    Pt presents for uri symptoms   Has had congestion on /off since last uri late June   Now for 2 weeks coughs so hard she chokes  ST for a few days  Makes her occational vomits  Causes throat pressure   Has spells of cough  Usually productive - milky white in color  Some wheezing recently -at night  Worried about pneumonia  A little chest tightness   Not a lot of head congestion  Some runny nose   No headache No fever or chills   Current smoker : about a pack a day   Has a baseline smokers cough   Working long hours    Over the counter Tried claritin (? Maybe generic) Generic mucinex dm -no help  Then robitussin dm name brand-helped a bit more   Covid test is negative today   Cxr DG Chest 2 View  Result Date: 05/01/2023 CLINICAL DATA:  Cough.  Wheezing. EXAM: CHEST - 2 VIEW COMPARISON:  11/23/2012 FINDINGS: The heart size and mediastinal contours are within normal limits. Both lungs are clear. Surgical clips again seen in right axilla. The visualized skeletal structures are unremarkable. IMPRESSION: No active cardiopulmonary disease. Electronically Signed   By: Danae Orleans M.D.   On: 05/01/2023 13:23      Patient Active Problem List   Diagnosis Date Noted  . Acute bronchitis 05/01/2023  . ETD (eustachian tube dysfunction) 02/20/2023  . Estrogen deficiency 12/04/2022  . Vitamin D deficiency 12/04/2022  . Current use of proton pump inhibitor 12/04/2022  . Intermittent diarrhea 09/24/2022  . Osteoporosis, post-menopausal 09/04/2022  . Smokers' cough (HCC) 11/27/2021  . Varicose veins with  inflammation 07/30/2021  . Encounter for adjustment or removal of myringotomy device (stent) (tube)   . Osteopenia 08/03/2019  . Drug-induced polyneuropathy (HCC) 05/05/2019  . Prediabetes 02/03/2018  . Carcinoma of overlapping sites of left breast in female, estrogen receptor positive (HCC) 07/29/2016  . Lymphedema 10/23/2015  . Carpal tunnel syndrome 10/23/2015  . Encounter for routine gynecological examination 02/10/2015  . Routine general medical examination at a health care facility 02/07/2015  . Degenerative disc disease, lumbar 02/04/2014  . Anterolisthesis 02/04/2014  . Postmenopausal estrogen deficiency 09/02/2013  . History of thrombocytopenia 01/06/2013  . Colon cancer screening 09/01/2012  . History of CVA (cerebrovascular accident) 04/17/2012  . URI with cough and congestion 01/10/2012  . Pure hypercholesterolemia 04/05/2008  . Essential hypertension 03/30/2008  . ANXIETY DEPRESSION 11/17/2007  . TOBACCO USE 11/17/2007  . Chronic back pain 09/23/2007  . Migraine headache 06/26/2000   Past Medical History:  Diagnosis Date  . Anxiety   . Anxiety and depression   . Barrett's esophageal ulceration   . Basal ganglia infarction (HCC) 04/17/2012  . Breast cancer (HCC) 2014   BILATERAL LUMPECTOMY  . Carpal tunnel syndrome   . Chronic back pain    spinal stenosis  . Depression   . Elevated WBC count    nl diff  . Erosive gastritis   .  Folliculitis 05/05/2011  . GERD (gastroesophageal reflux disease)   . Glaucoma    ?  Marland Kitchen History of chemotherapy 2014   BREAST CA  . HLD (hyperlipidemia)   . HSV infection    recurrent (side and buttox)  . Hypertension   . Migraine   . Personal history of chemotherapy 2014   bilateral breast ca  . Personal history of radiation therapy 2014   bilateral breast ca  . Radiation 2014   BREAST CA  . Tobacco abuse   . Wears dentures    top   Past Surgical History:  Procedure Laterality Date  . BREAST BIOPSY Bilateral 2014    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  . BREAST LUMPECTOMY Bilateral 2014   bilateral breast ca  . BTL    . CHOLECYSTECTOMY  01/2021  . COLONOSCOPY    . epidural steroid injection  2008  . ERCP N/A 01/29/2021   Procedure: ENDOSCOPIC RETROGRADE CHOLANGIOPANCREATOGRAPHY (ERCP);  Surgeon: Midge Minium, MD;  Location: [facility] ENDOSCOPY;  Service: Endoscopy;  Laterality: N/A;  . ERCP N/A 04/03/2021   Procedure: ENDOSCOPIC RETROGRADE CHOLANGIOPANCREATOGRAPHY (ERCP);  Surgeon: Midge Minium, MD;  Location: [facility] ENDOSCOPY;  Service: Endoscopy;  Laterality: N/A;  . LUMBAR FUSION  03/2016   L4-5  . PARTIAL MASTECTOMY WITH NEEDLE LOCALIZATION AND AXILLARY SENTINEL LYMPH NODE BX Bilateral 11/23/2012   Procedure: PARTIAL MASTECTOMY WITH NEEDLE LOCALIZATION AND AXILLARY SENTINEL LYMPH NODE BX;  Surgeon: Ernestene Mention, MD;  Location: [facility];  Service: General;  Laterality: Bilateral;  . PORT-A-CATH REMOVAL Right 08/25/2013   Procedure: REMOVAL PORT-A-CATH;  Surgeon: Ernestene Mention, MD;  Location: [facility];  Service: General;  Laterality: Right;  . PORTACATH PLACEMENT Right 11/23/2012   Procedure: INSERTION PORT-A-CATH;  Surgeon: Ernestene Mention, MD;  Location:  SURGERY CENTER;  Service: General;  Laterality: Right;  . TUBAL LIGATION    . UPPER GI ENDOSCOPY     Social History   Tobacco Use  . Smoking status: Every Day    Current packs/day: 0.50    Average packs/day: 0.5 packs/day for 33.0 years (16.5 ttl pk-yrs)    Types: Cigarettes  . Smokeless tobacco: Never  Vaping Use  . Vaping status: Never Used  Substance Use Topics  . Alcohol use: Yes    Alcohol/week: 0.0 standard drinks of alcohol    Comment: beer rarely  . Drug use: No   Family History  Problem Relation Age of Onset  . Stroke Maternal Grandmother   . Diabetes Maternal Grandmother   . Diabetes Mother   . Hypertension Mother   . Leukemia Mother   . Obesity Mother   .  Depression Sister   . Bipolar disorder Son        Bipolar / oppositional defiant  . Alcohol abuse Sister   . Drug abuse Sister   . Alcohol abuse Sister   . Drug abuse Sister   . Alcohol abuse Brother   . Drug abuse Brother   . Pancreatic cancer Brother   . Colon cancer Neg Hx   . Breast cancer Neg Hx    Allergies  Allergen Reactions  . Gabapentin Swelling  . Lyrica [Pregabalin] Other (See Comments)    sedated  . Voltaren [Diclofenac Sodium] Nausea And Vomiting  . Bupropion Hcl Nausea Only and Other (See Comments)     GI, sleepy  . Chlorhexidine Gluconate Itching and Rash  . Hydrocod Poli-Chlorphe Poli Er Nausea And Vomiting  vomiting  . Lisinopril Other (See Comments)    Leg cramps    . Naproxen Sodium Other (See Comments)    does not tolerate   Current Outpatient Medications on File Prior to Visit  Medication Sig Dispense Refill  . anastrozole (ARIMIDEX) 1 MG tablet TAKE 1 TABLET DAILY 90 tablet 0  . aspirin 81 MG chewable tablet Chew 1 tablet (81 mg total) by mouth daily. 90 tablet 3  . atorvastatin (LIPITOR) 10 MG tablet TAKE 1 TABLET DAILY 90 tablet 3  . b complex vitamins tablet Take 2 tablets by mouth daily.    . calcium carbonate (TUMS - DOSED IN MG ELEMENTAL CALCIUM) 500 MG chewable tablet Chew by mouth as directed.    . Calcium Carbonate-Vitamin D (CALCIUM + D PO) Take 1 tablet by mouth daily.    Marland Kitchen ipratropium (ATROVENT) 0.03 % nasal spray Place 2 sprays into both nostrils every 12 (twelve) hours.    Marland Kitchen losartan (COZAAR) 25 MG tablet TAKE 1 TABLET DAILY 90 tablet 3  . meloxicam (MOBIC) 15 MG tablet TAKE 1 TABLET DAILY. 90 tablet 1  . Multiple Vitamins-Minerals (MULTIVITAMIN WOMEN 50+ PO) Take 1 tablet by mouth daily.    . nicotine (NICODERM CQ - DOSED IN MG/24 HOURS) 14 mg/24hr patch PLACE 1 PATCH ONTO THE SKIN DAILY. 28 patch 2  . omeprazole (PRILOSEC) 40 MG capsule Take 1 capsule (40 mg total) by mouth daily. 90 capsule 0  . Probiotic Product (PROBIOTIC DAILY  PO) Take 2 tablets by mouth daily.     No current facility-administered medications on file prior to visit.    Review of Systems  Constitutional:  Positive for appetite change and fatigue. Negative for fever.  HENT:  Positive for congestion, postnasal drip and rhinorrhea. Negative for ear pain, sinus pressure, sneezing and sore throat.   Eyes:  Negative for pain and discharge.  Respiratory:  Positive for cough, chest tightness and wheezing. Negative for shortness of breath and stridor.   Cardiovascular:  Negative for chest pain.  Gastrointestinal:  Negative for diarrhea, nausea and vomiting.  Genitourinary:  Negative for frequency, hematuria and urgency.  Musculoskeletal:  Negative for arthralgias and myalgias.  Skin:  Negative for rash.  Neurological:  Negative for dizziness, weakness, light-headedness and headaches.  Psychiatric/Behavioral:  Negative for confusion and dysphoric mood.        Objective:   Physical Exam Constitutional:      General: She is not in acute distress.    Appearance: Normal appearance. She is well-developed. She is obese. She is not ill-appearing or diaphoretic.  HENT:     Head: Normocephalic and atraumatic.     Right Ear: Tympanic membrane and ear canal normal.     Left Ear: Tympanic membrane and ear canal normal.     Nose: Rhinorrhea present.     Mouth/Throat:     Mouth: Mucous membranes are moist.     Pharynx: Oropharynx is clear.  Eyes:     General:        Right eye: No discharge.        Left eye: No discharge.     Conjunctiva/sclera: Conjunctivae normal.     Pupils: Pupils are equal, round, and reactive to light.  Neck:     Thyroid: No thyromegaly.     Vascular: No carotid bruit or JVD.  Cardiovascular:     Rate and Rhythm: Normal rate and regular rhythm.     Heart sounds: Normal heart sounds.     No  gallop.  Pulmonary:     Effort: Pulmonary effort is normal. No respiratory distress.     Breath sounds: No stridor. Wheezing and rhonchi  present. No rales.     Comments: Diffusely distant bs  Scattered rhonchi Scant wheeze with forced exp Chest:     Chest wall: No tenderness.  Abdominal:     General: There is no distension or abdominal bruit.     Palpations: Abdomen is soft.  Musculoskeletal:     Cervical back: Normal range of motion and neck supple.     Right lower leg: No edema.     Left lower leg: No edema.  Lymphadenopathy:     Cervical: No cervical adenopathy.  Skin:    General: Skin is warm and dry.     Coloration: Skin is not pale.     Findings: No rash.  Neurological:     Mental Status: She is alert.     Coordination: Coordination normal.     Deep Tendon Reflexes: Reflexes are normal and symmetric. Reflexes normal.  Psychiatric:        Mood and Affect: Mood normal.          Assessment & Plan:   Problem List Items Addressed This Visit       Respiratory   Acute bronchitis - Primary    In smoker with baseline cough/ worsening over past 2 weeks with wheezing/ reactive airways  Reassuring exam  Discussed plan for smoking cessation with patches  Prescription Albuterol mdi, 40 mg pred taper (discussed side effects), tessalone and prometh dm (caution of sedation) to stop cough cycle Cxr today: clear  Update if not starting to improve in a week or if worsening  Call back and Er precautions noted in detail today  Symptom care encouraged -see AVS  Hadnouts given       Relevant Orders   DG Chest 2 View (Completed)   Smokers' cough (HCC)    Now acute bronchitis Possible copd early  See a/p bronchitis   Has patches Discussed plan to quit  Handout given      URI with cough and congestion    Causing bronchitis  Symptom care-see AVS See a/p bronchitis        Other   TOBACCO USE    Now with acute bronchitis  Possible early copd Disc in detail risks of smoking and possible outcomes including copd, vascular/ heart disease, cancer , respiratory and sinus infections  Pt voices  understanding  Pt has patches at home  Feels ready to start process of quitting soon       Other Visit Diagnoses     Cough, unspecified type       Relevant Orders   POC COVID-19 (Completed)   DG Chest 2 View (Completed)

## 2023-05-01 NOTE — Assessment & Plan Note (Signed)
Now acute bronchitis Possible copd early  See a/p bronchitis   Has patches Discussed plan to quit  Handout given

## 2023-05-01 NOTE — Assessment & Plan Note (Addendum)
In smoker with baseline cough/ worsening over past 2 weeks with wheezing/ reactive airways  Reassuring exam  Discussed plan for smoking cessation with patches  Prescription Albuterol mdi, 40 mg pred taper (discussed side effects), tessalone and prometh dm (caution of sedation) to stop cough cycle Cxr today: clear  Update if not starting to improve in a week or if worsening  Call back and Er precautions noted in detail today  Symptom care encouraged -see AVS  Hadnouts given

## 2023-05-01 NOTE — Assessment & Plan Note (Signed)
Now with acute bronchitis  Possible early copd Disc in detail risks of smoking and possible outcomes including copd, vascular/ heart disease, cancer , respiratory and sinus infections  Pt voices understanding  Pt has patches at home  Feels ready to start process of quitting soon

## 2023-05-01 NOTE — Assessment & Plan Note (Signed)
Causing bronchitis  Symptom care-see AVS See a/p bronchitis

## 2023-05-24 ENCOUNTER — Other Ambulatory Visit: Payer: Self-pay | Admitting: Nurse Practitioner

## 2023-07-29 ENCOUNTER — Other Ambulatory Visit: Payer: Self-pay | Admitting: Family Medicine

## 2023-07-29 DIAGNOSIS — Z853 Personal history of malignant neoplasm of breast: Secondary | ICD-10-CM

## 2023-07-29 DIAGNOSIS — N644 Mastodynia: Secondary | ICD-10-CM

## 2023-07-29 DIAGNOSIS — Z1231 Encounter for screening mammogram for malignant neoplasm of breast: Secondary | ICD-10-CM

## 2023-07-30 ENCOUNTER — Ambulatory Visit
Admission: RE | Admit: 2023-07-30 | Discharge: 2023-07-30 | Disposition: A | Discharge status: Home / Self Care | Payer: BC Managed Care – PPO | Source: Ambulatory Visit | Attending: Family Medicine | Admitting: Family Medicine

## 2023-07-30 ENCOUNTER — Inpatient Hospital Stay
Admission: RE | Admit: 2023-07-30 | Discharge: 2023-07-30 | Discharge status: Home / Self Care | Payer: BC Managed Care – PPO | Source: Ambulatory Visit | Attending: Family Medicine | Admitting: Family Medicine

## 2023-07-30 DIAGNOSIS — Z1231 Encounter for screening mammogram for malignant neoplasm of breast: Secondary | ICD-10-CM | POA: Insufficient documentation

## 2023-07-30 DIAGNOSIS — N644 Mastodynia: Secondary | ICD-10-CM

## 2023-07-30 DIAGNOSIS — Z853 Personal history of malignant neoplasm of breast: Secondary | ICD-10-CM

## 2023-09-05 ENCOUNTER — Inpatient Hospital Stay: Payer: BC Managed Care – PPO | Attending: Internal Medicine

## 2023-09-05 ENCOUNTER — Inpatient Hospital Stay: Payer: BC Managed Care – PPO

## 2023-09-05 ENCOUNTER — Inpatient Hospital Stay (HOSPITAL_BASED_OUTPATIENT_CLINIC_OR_DEPARTMENT_OTHER): Payer: BC Managed Care – PPO | Admitting: Internal Medicine

## 2023-09-05 ENCOUNTER — Inpatient Hospital Stay: Payer: BC Managed Care – PPO | Admitting: Internal Medicine

## 2023-09-05 ENCOUNTER — Encounter: Payer: Self-pay | Admitting: Internal Medicine

## 2023-09-05 VITALS — BP 144/76 | HR 77 | Temp 97.4°F | Resp 16 | Wt 156.2 lb

## 2023-09-05 DIAGNOSIS — Z79811 Long term (current) use of aromatase inhibitors: Secondary | ICD-10-CM | POA: Diagnosis not present

## 2023-09-05 DIAGNOSIS — C50812 Malignant neoplasm of overlapping sites of left female breast: Secondary | ICD-10-CM

## 2023-09-05 DIAGNOSIS — Z170 Estrogen receptor positive status [ER+]: Secondary | ICD-10-CM | POA: Diagnosis not present

## 2023-09-05 DIAGNOSIS — M8580 Other specified disorders of bone density and structure, unspecified site: Secondary | ICD-10-CM | POA: Diagnosis not present

## 2023-09-05 DIAGNOSIS — F17210 Nicotine dependence, cigarettes, uncomplicated: Secondary | ICD-10-CM | POA: Insufficient documentation

## 2023-09-05 LAB — CBC WITH DIFFERENTIAL (CANCER CENTER ONLY)
Abs Immature Granulocytes: 0.04 10*3/uL (ref 0.00–0.07)
Basophils Absolute: 0.1 10*3/uL (ref 0.0–0.1)
Basophils Relative: 1 %
Eosinophils Absolute: 0.1 10*3/uL (ref 0.0–0.5)
Eosinophils Relative: 2 %
HCT: 40.9 % (ref 36.0–46.0)
Hemoglobin: 13.9 g/dL (ref 12.0–15.0)
Immature Granulocytes: 0 %
Lymphocytes Relative: 32 %
Lymphs Abs: 2.9 10*3/uL (ref 0.7–4.0)
MCH: 32.7 pg (ref 26.0–34.0)
MCHC: 34 g/dL (ref 30.0–36.0)
MCV: 96.2 fL (ref 80.0–100.0)
Monocytes Absolute: 0.8 10*3/uL (ref 0.1–1.0)
Monocytes Relative: 9 %
Neutro Abs: 5.2 10*3/uL (ref 1.7–7.7)
Neutrophils Relative %: 56 %
Platelet Count: 223 10*3/uL (ref 150–400)
RBC: 4.25 MIL/uL (ref 3.87–5.11)
RDW: 12.8 % (ref 11.5–15.5)
WBC Count: 9.2 10*3/uL (ref 4.0–10.5)
nRBC: 0 % (ref 0.0–0.2)

## 2023-09-05 LAB — CMP (CANCER CENTER ONLY)
ALT: 16 U/L (ref 0–44)
AST: 18 U/L (ref 15–41)
Albumin: 3.7 g/dL (ref 3.5–5.0)
Alkaline Phosphatase: 48 U/L (ref 38–126)
Anion gap: 9 (ref 5–15)
BUN: 19 mg/dL (ref 8–23)
CO2: 25 mmol/L (ref 22–32)
Calcium: 8.8 mg/dL — ABNORMAL LOW (ref 8.9–10.3)
Chloride: 103 mmol/L (ref 98–111)
Creatinine: 0.48 mg/dL (ref 0.44–1.00)
GFR, Estimated: >60 mL/min (ref >60)
Glucose, Bld: 110 mg/dL — ABNORMAL HIGH (ref 70–99)
Potassium: 4.3 mmol/L (ref 3.5–5.1)
Sodium: 137 mmol/L (ref 135–145)
Total Bilirubin: 0.5 mg/dL (ref 0.0–1.2)
Total Protein: 6.2 g/dL — ABNORMAL LOW (ref 6.5–8.1)

## 2023-09-05 NOTE — Progress Notes (Signed)
 Pt doing well. Has some achiness esp in right bicep area of arm. Appetite is good. Some breast tenderness. Has weak upper arm strength.

## 2023-09-05 NOTE — Assessment & Plan Note (Addendum)
#   Bilateral synchronous breast cancer-early stage. Patient currently on adjuvant Arimidex  [started 2014; stop end of 2024].  STABLE.  No evidence of recurrence.    # Recommend STOPPING Arimidex  [jan 2025]; STOPPED after 10 years.  BIL  Mammogram DEC 2024-  -normal.  Continue annual mammogram further recommended by radiology.  # BMD Dec 2022- T score= -1.8 [2018=1.1; 2020=-1.5] -osteopenia- Ca+ vit.  Worsening. Discussed multiple options including exercise/ calcium  and vitamin D  supplementation. Discussed oral bisphosphonates- declined.   Defer to PCP.   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  Stable.    # Smoking- continues to smoking; counseled to quit- Stable.    #Since patient is clinically stable I think is reasonable for the patient to follow-up with PCP/can follow-up with us  as needed.  Patient comfortable with the plan; to call us  if any questions or concerns in the interim.  # DISPOSITION: # follow up as needed- Dr.B

## 2023-09-05 NOTE — Progress Notes (Signed)
 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 Fernand; [place]]  # RIGHT-IMC; lumpec & ALND-T1cN0; TNBC s/p RT  # LEFT-IMC;STAGE II  T1bN1 ER/PR-Pos Her 2 Neu-NEG s/p Lumpec & RT  # s/p AC x4- Taxol / gem-Carbo  # MARCH 2015- STARTED Arimidex ; STOPPING Arimidex  [jan 2025]; STOPPED after 10 years.    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  Ambulating independently.  Amy Leonard 63 y.o.  female pleasant patient above history of Bilateral synchronous breast cancer currently on Arimidex  is here for follow-up.  Patient is doing well. Has some achiness esp in right bicep area of arm. Appetite is good. Some breast tenderness.   Not been taking Calcium  +D daily basis. Patient denies any new lumps or bumps.  Appetite is good.  No weight loss.  Chronic joint pains knee pains.  Not any worse.  Patient continues to be compliant with Arimidex .  Review of Systems  Constitutional:  Negative for chills, diaphoresis, fever, malaise/fatigue and weight loss.  HENT:  Negative for nosebleeds and sore throat.   Eyes:  Negative for double vision.  Respiratory:  Negative for cough, hemoptysis, sputum production, shortness of breath and wheezing.   Cardiovascular:  Negative for chest pain,  palpitations, orthopnea and leg swelling.  Gastrointestinal:  Negative for abdominal pain, blood in stool, constipation, diarrhea, heartburn, melena, nausea and vomiting.  Genitourinary:  Negative for dysuria, frequency and urgency.  Musculoskeletal:  Positive for back pain and joint pain.  Skin: Negative.  Negative for itching and rash.  Neurological:  Negative for dizziness, tingling, focal weakness, weakness and headaches.  Endo/Heme/Allergies:  Does not bruise/bleed easily.  Psychiatric/Behavioral:  Negative for depression. The patient is not nervous/anxious and does not have insomnia.      PAST MEDICAL HISTORY :  Past Medical History:  Diagnosis Date   Anxiety    Anxiety and depression    Barrett's esophageal ulceration    Basal ganglia infarction (HCC) 04/17/2012   Breast cancer (HCC) 2014   BILATERAL LUMPECTOMY   Carpal tunnel syndrome    Chronic back pain    spinal stenosis   Depression    Elevated WBC count    nl diff   Erosive gastritis    Folliculitis 05/05/2011   GERD (gastroesophageal reflux disease)    Glaucoma    ?   History of chemotherapy 2014   BREAST CA   HLD (hyperlipidemia)    HSV infection    recurrent (side and buttox)   Hypertension    Migraine    Personal history of chemotherapy 2014   bilateral breast ca   Personal history of radiation therapy 2014   bilateral breast ca   Radiation 2014   BREAST CA   Tobacco abuse    Wears dentures  top    PAST SURGICAL HISTORY :   Past Surgical History:  Procedure Laterality Date   BREAST BIOPSY Bilateral 2014   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   BREAST LUMPECTOMY Bilateral 2014   bilateral breast ca   BTL     CHOLECYSTECTOMY  01/2021   COLONOSCOPY     epidural steroid injection  2008   ERCP N/A 01/29/2021   Procedure: ENDOSCOPIC RETROGRADE CHOLANGIOPANCREATOGRAPHY (ERCP);  Surgeon: Jinny Carmine, MD;  Location: [facility] ENDOSCOPY;  Service: Endoscopy;  Laterality: N/A;   ERCP N/A  04/03/2021   Procedure: ENDOSCOPIC RETROGRADE CHOLANGIOPANCREATOGRAPHY (ERCP);  Surgeon: Jinny Carmine, MD;  Location: [facility] ENDOSCOPY;  Service: Endoscopy;  Laterality: N/A;   LUMBAR FUSION  03/2016   L4-5   PARTIAL MASTECTOMY WITH NEEDLE LOCALIZATION AND AXILLARY SENTINEL LYMPH NODE BX Bilateral 11/23/2012   Procedure: PARTIAL MASTECTOMY WITH NEEDLE LOCALIZATION AND AXILLARY SENTINEL LYMPH NODE BX;  Surgeon: Elon CHRISTELLA Pacini, MD;  Location: [facility];  Service: General;  Laterality: Bilateral;   PORT-A-CATH REMOVAL Right 08/25/2013   Procedure: REMOVAL PORT-A-CATH;  Surgeon: Elon CHRISTELLA Pacini, MD;  Location: [facility];  Service: General;  Laterality: Right;   PORTACATH PLACEMENT Right 11/23/2012   Procedure: INSERTION PORT-A-CATH;  Surgeon: Elon CHRISTELLA Pacini, MD;  Location: [facility];  Service: General;  Laterality: Right;   TUBAL LIGATION     UPPER GI ENDOSCOPY      FAMILY HISTORY :   Family History  Problem Relation Age of Onset   Stroke Maternal Grandmother    Diabetes Maternal Grandmother    Diabetes Mother    Hypertension Mother    Leukemia Mother    Obesity Mother    Depression Sister    Bipolar disorder Son        Bipolar / oppositional defiant   Alcohol abuse Sister    Drug abuse Sister    Alcohol abuse Sister    Drug abuse Sister    Alcohol abuse Brother    Drug abuse Brother    Pancreatic cancer Brother    Colon cancer Neg Hx    Breast cancer Neg Hx     SOCIAL HISTORY:   Social History   Tobacco Use   Smoking status: Every Day    Current packs/day: 0.50    Average packs/day: 0.5 packs/day for 33.0 years (16.5 ttl pk-yrs)    Types: Cigarettes   Smokeless tobacco: Never  Vaping Use   Vaping status: Never Used  Substance Use Topics   Alcohol use: Yes    Alcohol/week: 0.0 standard drinks of alcohol    Comment: beer rarely   Drug use: No    ALLERGIES:  is allergic to gabapentin , lyrica  [pregabalin ], voltaren   [diclofenac  sodium], bupropion hcl, chlorhexidine  gluconate, hydrocod poli-chlorphe poli er, lisinopril , and naproxen sodium.  MEDICATIONS:  Current Outpatient Medications  Medication Sig Dispense Refill   albuterol  (VENTOLIN  HFA) 108 (90 Base) MCG/ACT inhaler Inhale 2 puffs into the lungs every 4 (four) hours as needed for wheezing or shortness of breath. 1 each 1   anastrozole  (ARIMIDEX ) 1 MG tablet TAKE 1 TABLET DAILY 90 tablet 1   aspirin  81 MG chewable tablet Chew 1 tablet (81 mg total) by mouth daily. 90 tablet 3   atorvastatin  (LIPITOR) 10 MG tablet TAKE 1 TABLET DAILY 90 tablet 3   b complex vitamins tablet Take 2 tablets by mouth daily.     calcium  carbonate (TUMS - DOSED IN MG ELEMENTAL  CALCIUM ) 500 MG chewable tablet Chew by mouth as directed.     Calcium  Carbonate-Vitamin D  (CALCIUM  + D PO) Take 1 tablet by mouth daily.     ipratropium (ATROVENT) 0.03 % nasal spray Place 2 sprays into both nostrils every 12 (twelve) hours.     losartan  (COZAAR ) 25 MG tablet TAKE 1 TABLET DAILY 90 tablet 3   meloxicam  (MOBIC ) 15 MG tablet TAKE 1 TABLET DAILY. 90 tablet 1   Multiple Vitamins-Minerals (MULTIVITAMIN WOMEN 50+ PO) Take 1 tablet by mouth daily.     omeprazole  (PRILOSEC) 40 MG capsule Take 1 capsule (40 mg total) by mouth daily. 90 capsule 0   Probiotic Product (PROBIOTIC DAILY PO) Take 2 tablets by mouth daily.     benzonatate  (TESSALON ) 200 MG capsule Take 1 capsule (200 mg total) by mouth 3 (three) times daily as needed. (Patient not taking: Reported on 09/05/2023) 30 capsule 1   nicotine  (NICODERM CQ  - DOSED IN MG/24 HOURS) 14 mg/24hr patch PLACE 1 PATCH ONTO THE SKIN DAILY. (Patient not taking: Reported on 09/05/2023) 28 patch 2   promethazine -dextromethorphan (PROMETHAZINE -DM) 6.25-15 MG/5ML syrup Take 5 mLs by mouth 4 (four) times daily as needed for cough. (Patient not taking: Reported on 09/05/2023) 118 mL 0   No current facility-administered medications for this visit.     PHYSICAL EXAMINATION: ECOG PERFORMANCE STATUS: 0 - Asymptomatic  BP (!) 144/76   Pulse 77   Temp (!) 97.4 F (36.3 C) (Tympanic)   Resp 16   Wt 156 lb 3.2 oz (70.9 kg)   SpO2 100%   BMI 28.57 kg/m   Filed Weights   09/05/23 0953  Weight: 156 lb 3.2 oz (70.9 kg)    Physical Exam HENT:     Head: Normocephalic and atraumatic.     Mouth/Throat:     Pharynx: No oropharyngeal exudate.  Eyes:     Pupils: Pupils are equal, round, and reactive to light.  Cardiovascular:     Rate and Rhythm: Normal rate and regular rhythm.  Pulmonary:     Effort: No respiratory distress.     Breath sounds: No wheezing.  Abdominal:     General: Bowel sounds are normal. There is no distension.     Palpations: Abdomen is soft. There is no mass.     Tenderness: There is no abdominal tenderness. There is no guarding or rebound.  Musculoskeletal:        General: No tenderness. Normal range of motion.     Cervical back: Normal range of motion and neck supple.  Skin:    General: Skin is warm.  Neurological:     Mental Status: She is alert and oriented to person, place, and time.  Psychiatric:        Mood and Affect: Affect normal.      LABORATORY DATA:  I have reviewed the data as listed    Component Value Date/Time   NA 137 09/05/2023 0940   NA 140 07/25/2014 1100   NA 140 11/02/2013 1040   K 4.3 09/05/2023 0940   K 4.1 07/25/2014 1100   K 3.9 11/02/2013 1040   CL 103 09/05/2023 0940   CL 105 07/25/2014 1100   CL 109 (H) 02/17/2013 1120   CO2 25 09/05/2023 0940   CO2 30 07/25/2014 1100   CO2 24 11/02/2013 1040   GLUCOSE 110 (H) 09/05/2023 0940   GLUCOSE 97 07/25/2014 1100   GLUCOSE 103 11/02/2013 1040   GLUCOSE 99 02/17/2013 1120   BUN  19 09/05/2023 0940   BUN 15 07/25/2014 1100   BUN 9.3 11/02/2013 1040   CREATININE 0.48 09/05/2023 0940   CREATININE 0.74 07/25/2014 1100   CREATININE 0.7 11/02/2013 1040   CALCIUM  8.8 (L) 09/05/2023 0940   CALCIUM  8.6 07/25/2014 1100    CALCIUM  9.5 11/02/2013 1040   PROT 6.2 (L) 09/05/2023 0940   PROT 6.4 07/25/2014 1100   PROT 6.2 (L) 11/02/2013 1040   ALBUMIN 3.7 09/05/2023 0940   ALBUMIN 3.5 07/25/2014 1100   ALBUMIN 3.7 11/02/2013 1040   AST 18 09/05/2023 0940   AST 20 11/02/2013 1040   ALT 16 09/05/2023 0940   ALT 24 07/25/2014 1100   ALT 22 11/02/2013 1040   ALKPHOS 48 09/05/2023 0940   ALKPHOS 75 07/25/2014 1100   ALKPHOS 84 11/02/2013 1040   BILITOT 0.5 09/05/2023 0940   BILITOT 0.48 11/02/2013 1040   GFRNONAA >60 09/05/2023 0940   GFRNONAA >60 07/25/2014 1100   GFRAA >60 07/29/2016 0914   GFRAA >60 07/25/2014 1100    No results found for: SPEP, UPEP  Lab Results  Component Value Date   WBC 9.2 09/05/2023   NEUTROABS 5.2 09/05/2023   HGB 13.9 09/05/2023   HCT 40.9 09/05/2023   MCV 96.2 09/05/2023   PLT 223 09/05/2023      Chemistry      Component Value Date/Time   NA 137 09/05/2023 0940   NA 140 07/25/2014 1100   NA 140 11/02/2013 1040   K 4.3 09/05/2023 0940   K 4.1 07/25/2014 1100   K 3.9 11/02/2013 1040   CL 103 09/05/2023 0940   CL 105 07/25/2014 1100   CL 109 (H) 02/17/2013 1120   CO2 25 09/05/2023 0940   CO2 30 07/25/2014 1100   CO2 24 11/02/2013 1040   BUN 19 09/05/2023 0940   BUN 15 07/25/2014 1100   BUN 9.3 11/02/2013 1040   CREATININE 0.48 09/05/2023 0940   CREATININE 0.74 07/25/2014 1100   CREATININE 0.7 11/02/2013 1040      Component Value Date/Time   CALCIUM  8.8 (L) 09/05/2023 0940   CALCIUM  8.6 07/25/2014 1100   CALCIUM  9.5 11/02/2013 1040   ALKPHOS 48 09/05/2023 0940   ALKPHOS 75 07/25/2014 1100   ALKPHOS 84 11/02/2013 1040   AST 18 09/05/2023 0940   AST 20 11/02/2013 1040   ALT 16 09/05/2023 0940   ALT 24 07/25/2014 1100   ALT 22 11/02/2013 1040   BILITOT 0.5 09/05/2023 0940   BILITOT 0.48 11/02/2013 1040       RADIOGRAPHIC STUDIES: I have personally reviewed the radiological images as listed and agreed with the findings in the report. No  results found.   ASSESSMENT & PLAN:  Carcinoma of overlapping sites of left breast in female, estrogen receptor positive (HCC) # Bilateral synchronous breast cancer-early stage. Patient currently on adjuvant Arimidex  [started 2014; stop end of 2024].  STABLE.  No evidence of recurrence.    # Recommend STOPPING Arimidex  [jan 2025]; STOPPED after 10 years.  BIL  Mammogram DEC 2024-  -normal.  Continue annual mammogram further recommended by radiology.  # BMD Dec 2022- T score= -1.8 [2018=1.1; 2020=-1.5] -osteopenia- Ca+ vit.  Worsening. Discussed multiple options including exercise/ calcium  and vitamin D  supplementation. Discussed oral bisphosphonates- declined.   Defer to PCP.   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  Stable.    # Smoking- continues to smoking; counseled to quit- Stable.    #Since patient is clinically stable  I think is reasonable for the patient to follow-up with PCP/can follow-up with us  as needed.  Patient comfortable with the plan; to call us  if any questions or concerns in the interim.  # DISPOSITION: # follow up as needed- Dr.B   Orders Placed This Encounter  Procedures   CBC with Differential (Cancer Center Only)    Standing Status:   Future    Number of Occurrences:   1    Expected Date:   09/05/2023    Expiration Date:   09/04/2024   CMP (Cancer Center only)    Standing Status:   Future    Number of Occurrences:   1    Expected Date:   09/05/2023    Expiration Date:   09/04/2024   All questions were answered. The patient knows to call the clinic with any problems, questions or concerns.      Cindy JONELLE Joe, MD 09/05/2023 10:38 AM

## 2023-12-04 ENCOUNTER — Other Ambulatory Visit: Payer: Self-pay | Admitting: Nurse Practitioner

## 2023-12-04 ENCOUNTER — Other Ambulatory Visit: Payer: Self-pay | Admitting: Family Medicine

## 2023-12-04 NOTE — Telephone Encounter (Signed)
 [email or web address]

## 2023-12-04 NOTE — Telephone Encounter (Signed)
 Pt is due for her CPE (labs prior), please scheduled and route back to me to refill

## 2023-12-05 ENCOUNTER — Encounter: Payer: Self-pay | Admitting: Internal Medicine

## 2023-12-05 ENCOUNTER — Telehealth: Payer: Self-pay | Admitting: Internal Medicine

## 2023-12-05 NOTE — Telephone Encounter (Signed)
 STOPPING Arimidex [jan 2025]; STOPPED after 10 years; NO refills  GB

## 2023-12-05 NOTE — Telephone Encounter (Signed)
 Spoke to pt, scheduled cpe for 12/30/23

## 2023-12-21 ENCOUNTER — Other Ambulatory Visit: Payer: Self-pay | Admitting: Nurse Practitioner

## 2023-12-21 ENCOUNTER — Telehealth: Payer: Self-pay | Admitting: Family Medicine

## 2023-12-21 DIAGNOSIS — Z79899 Other long term (current) drug therapy: Secondary | ICD-10-CM

## 2023-12-21 DIAGNOSIS — M810 Age-related osteoporosis without current pathological fracture: Secondary | ICD-10-CM

## 2023-12-21 DIAGNOSIS — E559 Vitamin D deficiency, unspecified: Secondary | ICD-10-CM

## 2023-12-21 DIAGNOSIS — I10 Essential (primary) hypertension: Secondary | ICD-10-CM

## 2023-12-21 DIAGNOSIS — E7800 Pure hypercholesterolemia, unspecified: Secondary | ICD-10-CM

## 2023-12-21 DIAGNOSIS — R7303 Prediabetes: Secondary | ICD-10-CM

## 2023-12-21 NOTE — Telephone Encounter (Signed)
-----   Message from Gerry Krone sent at 12/10/2023  3:49 PM EDT ----- Regarding: Lab orders for Mon, 4.28.25 Patient is scheduled for CPX labs, please order future labs, Thanks , Anselmo Kings

## 2023-12-22 ENCOUNTER — Other Ambulatory Visit (INDEPENDENT_AMBULATORY_CARE_PROVIDER_SITE_OTHER)

## 2023-12-22 DIAGNOSIS — R7303 Prediabetes: Secondary | ICD-10-CM | POA: Diagnosis not present

## 2023-12-22 DIAGNOSIS — E7800 Pure hypercholesterolemia, unspecified: Secondary | ICD-10-CM | POA: Diagnosis not present

## 2023-12-22 DIAGNOSIS — Z79899 Other long term (current) drug therapy: Secondary | ICD-10-CM

## 2023-12-22 DIAGNOSIS — I10 Essential (primary) hypertension: Secondary | ICD-10-CM | POA: Diagnosis not present

## 2023-12-22 DIAGNOSIS — E559 Vitamin D deficiency, unspecified: Secondary | ICD-10-CM | POA: Diagnosis not present

## 2023-12-22 DIAGNOSIS — M810 Age-related osteoporosis without current pathological fracture: Secondary | ICD-10-CM | POA: Diagnosis not present

## 2023-12-22 LAB — LIPID PANEL
Cholesterol: 160 mg/dL (ref 0–200)
HDL: 53.4 mg/dL (ref >39.00)
LDL Cholesterol: 88 mg/dL (ref 0–99)
NonHDL: 106.62
Total CHOL/HDL Ratio: 3
Triglycerides: 92 mg/dL (ref 0.0–149.0)
VLDL: 18.4 mg/dL (ref 0.0–40.0)

## 2023-12-22 LAB — CBC WITH DIFFERENTIAL/PLATELET
Basophils Absolute: 0.1 10*3/uL (ref 0.0–0.1)
Basophils Relative: 0.8 % (ref 0.0–3.0)
Eosinophils Absolute: 0.1 10*3/uL (ref 0.0–0.7)
Eosinophils Relative: 0.8 % (ref 0.0–5.0)
HCT: 43.9 % (ref 36.0–46.0)
Hemoglobin: 14.6 g/dL (ref 12.0–15.0)
Lymphocytes Relative: 25.4 % (ref 12.0–46.0)
Lymphs Abs: 2.1 10*3/uL (ref 0.7–4.0)
MCHC: 33.4 g/dL (ref 30.0–36.0)
MCV: 97.4 fl (ref 78.0–100.0)
Monocytes Absolute: 0.6 10*3/uL (ref 0.1–1.0)
Monocytes Relative: 7.6 % (ref 3.0–12.0)
Neutro Abs: 5.3 10*3/uL (ref 1.4–7.7)
Neutrophils Relative %: 65.4 % (ref 43.0–77.0)
Platelets: 215 10*3/uL (ref 150.0–400.0)
RBC: 4.51 Mil/uL (ref 3.87–5.11)
RDW: 13.8 % (ref 11.5–15.5)
WBC: 8.1 10*3/uL (ref 4.0–10.5)

## 2023-12-22 LAB — COMPREHENSIVE METABOLIC PANEL WITH GFR
ALT: 14 U/L (ref 0–35)
AST: 14 U/L (ref 0–37)
Albumin: 4.1 g/dL (ref 3.5–5.2)
Alkaline Phosphatase: 57 U/L (ref 39–117)
BUN: 12 mg/dL (ref 6–23)
CO2: 29 meq/L (ref 19–32)
Calcium: 9.4 mg/dL (ref 8.4–10.5)
Chloride: 101 meq/L (ref 96–112)
Creatinine, Ser: 0.59 mg/dL (ref 0.40–1.20)
GFR: 96.04 mL/min (ref >60.00)
Glucose, Bld: 110 mg/dL — ABNORMAL HIGH (ref 70–99)
Potassium: 4.2 meq/L (ref 3.5–5.1)
Sodium: 137 meq/L (ref 135–145)
Total Bilirubin: 0.7 mg/dL (ref 0.2–1.2)
Total Protein: 6.2 g/dL (ref 6.0–8.3)

## 2023-12-22 LAB — VITAMIN D 25 HYDROXY (VIT D DEFICIENCY, FRACTURES): VITD: 21.97 ng/mL — ABNORMAL LOW (ref 30.00–100.00)

## 2023-12-22 LAB — VITAMIN B12: Vitamin B-12: 979 pg/mL — ABNORMAL HIGH (ref 211–911)

## 2023-12-22 LAB — TSH: TSH: 2.76 u[IU]/mL (ref 0.35–5.50)

## 2023-12-22 LAB — HEMOGLOBIN A1C: Hgb A1c MFr Bld: 5.9 % (ref 4.6–6.5)

## 2023-12-23 ENCOUNTER — Encounter: Payer: Self-pay | Admitting: Internal Medicine

## 2023-12-30 ENCOUNTER — Ambulatory Visit (INDEPENDENT_AMBULATORY_CARE_PROVIDER_SITE_OTHER): Admitting: Family Medicine

## 2023-12-30 ENCOUNTER — Encounter: Payer: Self-pay | Admitting: Internal Medicine

## 2023-12-30 ENCOUNTER — Encounter: Payer: Self-pay | Admitting: Family Medicine

## 2023-12-30 VITALS — BP 132/70 | HR 85 | Temp 98.1°F | Ht 61.5 in | Wt 152.4 lb

## 2023-12-30 DIAGNOSIS — Z79899 Other long term (current) drug therapy: Secondary | ICD-10-CM | POA: Diagnosis not present

## 2023-12-30 DIAGNOSIS — Z1211 Encounter for screening for malignant neoplasm of colon: Secondary | ICD-10-CM

## 2023-12-30 DIAGNOSIS — E559 Vitamin D deficiency, unspecified: Secondary | ICD-10-CM

## 2023-12-30 DIAGNOSIS — R7303 Prediabetes: Secondary | ICD-10-CM | POA: Diagnosis not present

## 2023-12-30 DIAGNOSIS — Z5986 Financial insecurity: Secondary | ICD-10-CM | POA: Insufficient documentation

## 2023-12-30 DIAGNOSIS — F341 Dysthymic disorder: Secondary | ICD-10-CM

## 2023-12-30 DIAGNOSIS — M810 Age-related osteoporosis without current pathological fracture: Secondary | ICD-10-CM

## 2023-12-30 DIAGNOSIS — Z0000 Encounter for general adult medical examination without abnormal findings: Secondary | ICD-10-CM

## 2023-12-30 DIAGNOSIS — E7800 Pure hypercholesterolemia, unspecified: Secondary | ICD-10-CM

## 2023-12-30 DIAGNOSIS — G43001 Migraine without aura, not intractable, with status migrainosus: Secondary | ICD-10-CM

## 2023-12-30 DIAGNOSIS — E2839 Other primary ovarian failure: Secondary | ICD-10-CM

## 2023-12-30 DIAGNOSIS — I10 Essential (primary) hypertension: Secondary | ICD-10-CM

## 2023-12-30 DIAGNOSIS — Z23 Encounter for immunization: Secondary | ICD-10-CM | POA: Diagnosis not present

## 2023-12-30 DIAGNOSIS — G620 Drug-induced polyneuropathy: Secondary | ICD-10-CM

## 2023-12-30 DIAGNOSIS — Z862 Personal history of diseases of the blood and blood-forming organs and certain disorders involving the immune mechanism: Secondary | ICD-10-CM

## 2023-12-30 DIAGNOSIS — M8589 Other specified disorders of bone density and structure, multiple sites: Secondary | ICD-10-CM

## 2023-12-30 DIAGNOSIS — J410 Simple chronic bronchitis: Secondary | ICD-10-CM

## 2023-12-30 DIAGNOSIS — F17200 Nicotine dependence, unspecified, uncomplicated: Secondary | ICD-10-CM

## 2023-12-30 NOTE — Assessment & Plan Note (Signed)
 About the same  Good foot care

## 2023-12-30 NOTE — Assessment & Plan Note (Signed)
 Dexa ordered

## 2023-12-30 NOTE — Assessment & Plan Note (Signed)
 Lab Results  Component Value Date   VITAMINB12 979 (H) 12/22/2023   Last vitamin D  Lab Results  Component Value Date   VD25OH 21.97 (L) 12/22/2023    Having trouble affording vit D Comm care referral done for this

## 2023-12-30 NOTE — Assessment & Plan Note (Signed)
Due for colonoscopy  ?Referral done  ? ? ?

## 2023-12-30 NOTE — Assessment & Plan Note (Signed)
 Disc goals for lipids and reasons to control them Rev last labs with pt Rev low sat fat diet in detail  Some improvement with better compliance with atorvastatin  10 mg daily LDL of 88 up a bit  Problems affording healthy food  Continues atorvastatin  10 mg daily

## 2023-12-30 NOTE — Assessment & Plan Note (Signed)
 Lab Results  Component Value Date   HGBA1C 5.9 12/22/2023   HGBA1C 5.9 11/26/2022   HGBA1C 5.9 11/23/2021   disc imp of low glycemic diet and wt loss to prevent DM2   Financial insecurity causes issues getting healthy food

## 2023-12-30 NOTE — Assessment & Plan Note (Signed)
 No changes clinically or in exam Counseled for smoking cessation

## 2023-12-30 NOTE — Assessment & Plan Note (Signed)
 bp in fair control at this time  BP Readings from Last 1 Encounters:  12/30/23 132/70  improved with addn of losartan  25 mg daily   No changes needed Most recent labs reviewed  Disc lifstyle change with low sodium diet and exercise  Strongly enc to quit smoking

## 2023-12-30 NOTE — Assessment & Plan Note (Signed)
 Last vitamin D  Lab Results  Component Value Date   VD25OH 21.97 (L) 12/22/2023   Unable to afford her D and ca supplement   Referral made to com care   Discussed fall prevention, supplements and exercise for bone density

## 2023-12-30 NOTE — Assessment & Plan Note (Signed)
 Gets more of these in change of season  Discussed need for more fluids Discussed need to quit smoking  Follow up if not improved

## 2023-12-30 NOTE — Assessment & Plan Note (Signed)
 Pt has trouble affording some medicines  Primarily ca and D for osteopenia  Also food-not eating well due to this  Referral made to comm care

## 2023-12-30 NOTE — Patient Instructions (Addendum)
 Aim for 64 oz of fluid daily -mostly water  Limit caffeine as well unless you have a headache   Please start back on calcium  plus D when you can   If you are interested in the new shingles vaccine (Shingrix) - call your local pharmacy to check on coverage and availability   Pneumonia vaccine today   Don't miss your flu shot in the fall    Call and schedule your colonoscopy  [facility]  [phone]   I am going to refer you to our community care to address financial issues with medication   Keep walking  Add some strength training to your routine, this is important for bone and brain health and can reduce your risk of falls and help your body use insulin properly and regulate weight  Light weights, exercise bands , and internet videos are a good way to start  Yoga (chair or regular), machines , floor exercises or a gym with machines are also good options   Pati Bonine work is good too   Call and schedule your bone density test You have an order for:  []   3D Mammogram  [x]   Bone Density     Please call for appointment:   [x]   [facility]  [address] [place] [address]  [phone]  []   [facility])   [address]  [place], [place] [address]  [phone]  []   [facility]      [address] [place], [place]        [phone]         []   [facility]  [address] [place], [place]  [phone]  []  [facility]   [address]   [place], [place] [address]  [phone]  []  [facility]  [address] [place], [place] [address] [phone]    Make sure to wear two piece clothing  No lotions powders or deodorants the day of the appointment Make sure to bring picture ID and insurance card.   Bring list of medications you are currently taking including any supplements.   Schedule your screening mammogram through MyChart!   Select [place] imaging sites can now be scheduled through MyChart.  Log into your MyChart account.  Go to 'Visit' (or 'Appointments' if  on mobile App) --> Schedule an  Appointment  Under 'Select a Reason for Visit' choose the Mammogram  Screening option.  Complete the pre-visit questions  and select the time and place that  best fits your schedule

## 2023-12-30 NOTE — Assessment & Plan Note (Signed)
 Disc in detail risks of smoking and possible outcomes including copd, vascular/ heart disease, cancer , respiratory and sinus infections as well as osteoporosis  Pt voices understanding 1ppd Not ready to quit

## 2023-12-30 NOTE — Assessment & Plan Note (Signed)
 Normal plt ct of 215 today

## 2023-12-30 NOTE — Assessment & Plan Note (Addendum)
 Reviewed health habits including diet and exercise and skin cancer prevention Reviewed appropriate screening tests for age  Also reviewed health mt list, fam hx and immunization status , as well as social and family history   See HPI Labs reviewed and ordered Health Maintenance  Topic Date Due   Zoster (Shingles) Vaccine (2 of 2) 06/12/2018   Pneumococcal Vaccination (2 of 2 - PCV) 07/30/2018   COVID-19 Vaccine (3 - Moderna risk series) 12/27/2019   Colon Cancer Screening  10/20/2022   Flu Shot  03/26/2024   Mammogram  07/29/2024   Pap with HPV screening  12/04/2027   DTaP/Tdap/Td vaccine (4 - Td or Tdap) 11/17/2031   Hepatitis C Screening  Completed   HIV Screening  Completed   HPV Vaccine  Aged Out   Meningitis B Vaccine  Aged Out   Prevnar 20 given  Will get her 2nd shingrix at pharmacy  Dexa ordered  Colonoscopy referral done  Discussed fall prevention, supplements and exercise for bone density  Phq of 4 due to fatigue/long work hours  Did ref for comm care for financial insecurity

## 2023-12-30 NOTE — Assessment & Plan Note (Signed)
 Dexa ordered at [facility] Past treatment with arimidex  =off it now  Discussed fall prevention, supplements and exercise for bone density   Cannot currently afford her supplements (ca and D)  Did refer for community care to help for that   No falls or fracture Discussed fall prevention, supplements and exercise for bone density   Handout given

## 2023-12-30 NOTE — Progress Notes (Signed)
 Subjective:    Patient ID: Amy Leonard, female    DOB: Jun 13, 1961, 63 y.o.   MRN: [id number]  HPI  Here for health maintenance exam and to review chronic medical problems   Wt Readings from Last 3 Encounters:  12/30/23 152 lb 6.4 oz (69.1 kg)  09/05/23 156 lb 3.2 oz (70.9 kg)  05/01/23 154 lb (69.9 kg)   28.33 kg/m  Vitals:   12/30/23 0918  BP: 132/70  Pulse: 85  Temp: 98.1 F (36.7 C)  SpO2: 95%    Immunization History  Administered Date(s) Administered   Influenza Split 09/01/2012   Influenza,inj,Quad PF,6+ Mos 07/30/2017, 05/05/2019, 08/30/2020   Moderna Sars-Covid-2 Vaccination 10/28/2019, 11/29/2019   PNEUMOCOCCAL CONJUGATE-20 12/30/2023   Pneumococcal Polysaccharide-23 07/30/2017   Td 10/25/2002   Tdap 09/01/2012, 11/16/2021   Zoster Recombinant(Shingrix) 04/17/2018    Health Maintenance Due  Topic Date Due   Zoster Vaccines- Shingrix (2 of 2) 06/12/2018   COVID-19 Vaccine (3 - Moderna risk series) 12/27/2019   Colonoscopy  10/20/2022   Getting over a migraine today  Was worse yesterday  Drinks   Shingrix- first vaccine was 2019   Pna vaccine 2018- will update that today     Mammogram 07/2023 Personal history of breast cancer  Took arimidex   Self breast exam- no changes    Has neuropathy from past chemo   Gyn health Pap 11/2022  Colon cancer screening  Colonoscopy 09/2012 -due for 10 y recall , Dr Sandrea Cruel   Bone health  Dexa 07/2021 osteopenia  Takes arimidex    Dental status was issue Was checking on reclast  Falls-none Fractures-none  Supplements was on ca and D/ could not continue to afford it   Last vitamin D  Lab Results  Component Value Date   VD25OH 21.97 (L) 12/22/2023    Exercise  Works outside/ yard work  [employer] all day at work   Some pain in right arm with lifting over her head     Mood    12/30/2023    9:15 AM 02/20/2023   12:34 PM 12/04/2022   10:08 AM 09/24/2022    9:01 AM 11/27/2021   11:39 AM  Depression  screen PHQ 2/9  Decreased Interest 1 0 0 1 1  Down, Depressed, Hopeless 0 0 1 1 1   PHQ - 2 Score 1 0 1 2 2   Altered sleeping 1 2 1 2 3   Tired, decreased energy 1 2 2 2 3   Change in appetite 1 2 1 2  0  Feeling bad or failure about yourself  0 0 0 2 0  Trouble concentrating 0 0 1 2 0  Moving slowly or fidgety/restless 0 0 1 1 0  Suicidal thoughts 0 0 0 0 0  PHQ-9 Score 4 6 7 13 8   Difficult doing work/chores  Not difficult at all Somewhat difficult Somewhat difficult Not difficult at all  No medication for mood Declines intervention   HTN bp is stable today  No cp or palpitations or headaches or edema  No side effects to medicines  BP Readings from Last 3 Encounters:  12/30/23 132/70  09/05/23 (!) 144/76  05/01/23 132/80    Losartan  25 mg daily   Lab Results  Component Value Date   NA 137 12/22/2023   K 4.2 12/22/2023   CO2 29 12/22/2023   GLUCOSE 110 (H) 12/22/2023   BUN 12 12/22/2023   CREATININE 0.59 12/22/2023   CALCIUM  9.4 12/22/2023   GFR 96.04 12/22/2023   GFRNONAA >  60 09/05/2023   Smoking status : about 1 ppd  Not ready to quit  Declines the lung cancer screening program    GERD Omeprazole  40 mg from GI  Lab Results  Component Value Date   VITAMINB12 979 (H) 12/22/2023  Has not been taking her mvi  Takes a b complex vitamin  for neuropathy   Lab Results  Component Value Date   WBC 8.1 12/22/2023   HGB 14.6 12/22/2023   HCT 43.9 12/22/2023   MCV 97.4 12/22/2023   PLT 215.0 12/22/2023   Prediabetes Lab Results  Component Value Date   HGBA1C 5.9 12/22/2023   HGBA1C 5.9 11/26/2022   HGBA1C 5.9 11/23/2021   Financial strain - cannot always afford healthy food  Works very long hours  Occational veggie   Hyperlipidemia Lab Results  Component Value Date   CHOL 160 12/22/2023   CHOL 135 11/26/2022   CHOL 159 11/23/2021   Lab Results  Component Value Date   HDL 53.40 12/22/2023   HDL 51.20 11/26/2022   HDL 48.20 11/23/2021   Lab Results   Component Value Date   LDLCALC 88 12/22/2023   LDLCALC 71 11/26/2022   LDLCALC 97 11/23/2021   Lab Results  Component Value Date   TRIG 92.0 12/22/2023   TRIG 64.0 11/26/2022   TRIG 69.0 11/23/2021   Lab Results  Component Value Date   CHOLHDL 3 12/22/2023   CHOLHDL 3 11/26/2022   CHOLHDL 3 11/23/2021   Lab Results  Component Value Date   LDLDIRECT 130.3 01/26/2010      Patient Active Problem List   Diagnosis Date Noted   Financially insecure 12/30/2023   Estrogen deficiency 12/04/2022   Vitamin D  deficiency 12/04/2022   Current use of proton pump inhibitor 12/04/2022   Intermittent diarrhea 09/24/2022   Smokers' cough (HCC) 11/27/2021   Varicose veins with inflammation 07/30/2021   Osteopenia 08/03/2019   Drug-induced polyneuropathy (HCC) 05/05/2019   Prediabetes 02/03/2018   Carcinoma of overlapping sites of left breast in female, estrogen receptor positive (HCC) 07/29/2016   Lymphedema 10/23/2015   Carpal tunnel syndrome 10/23/2015   Encounter for routine gynecological examination 02/10/2015   Routine general medical examination at a health care facility 02/07/2015   Degenerative disc disease, lumbar 02/04/2014   Anterolisthesis 02/04/2014   Postmenopausal estrogen deficiency 09/02/2013   History of thrombocytopenia 01/06/2013   Colon cancer screening 09/01/2012   History of CVA (cerebrovascular accident) 04/17/2012   Pure hypercholesterolemia 04/05/2008   Essential hypertension 03/30/2008   ANXIETY DEPRESSION 11/17/2007   TOBACCO USE 11/17/2007   Chronic back pain 09/23/2007   Migraine headache 06/26/2000   Past Medical History:  Diagnosis Date   Anxiety    Anxiety and depression    Barrett's esophageal ulceration    Basal ganglia infarction (HCC) 04/17/2012   Breast cancer (HCC) 2014   BILATERAL LUMPECTOMY   Carpal tunnel syndrome    Chronic back pain    spinal stenosis   Depression    Elevated WBC count    nl diff   Erosive gastritis     Folliculitis 05/05/2011   GERD (gastroesophageal reflux disease)    Glaucoma    ?   History of chemotherapy 2014   BREAST CA   HLD (hyperlipidemia)    HSV infection    recurrent (side and buttox)   Hypertension    Migraine    Personal history of chemotherapy 2014   bilateral breast ca   Personal history of radiation therapy 2014  bilateral breast ca   Radiation 2014   BREAST CA   Tobacco abuse    Wears dentures    top   Past Surgical History:  Procedure Laterality Date   BREAST BIOPSY Bilateral 2014   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   BREAST LUMPECTOMY Bilateral 2014   bilateral breast ca   BTL     CHOLECYSTECTOMY  01/2021   COLONOSCOPY     epidural steroid injection  2008   ERCP N/A 01/29/2021   Procedure: ENDOSCOPIC RETROGRADE CHOLANGIOPANCREATOGRAPHY (ERCP);  Surgeon: Marnee Sink, MD;  Location[facility] ENDOSCOPY;  Service: Endoscopy;  Laterality: N/A;   ERCP N/A 04/03/2021   Procedure: ENDOSCOPIC RETROGRADE CHOLANGIOPANCREATOGRAPHY (ERCP);  Surgeon: Marnee Sink, MD;  Location: [facility] ENDOSCOPY;  Service: Endoscopy;  Laterality: N/A;   LUMBAR FUSION  03/2016   L4-5   PARTIAL MASTECTOMY WITH NEEDLE LOCALIZATION AND AXILLARY SENTINEL LYMPH NODE BX Bilateral 11/23/2012   Procedure: PARTIAL MASTECTOMY WITH NEEDLE LOCALIZATION AND AXILLARY SENTINEL LYMPH NODE BX;  Surgeon: Levert Ready, MD;  Location: [facility];  Service: General;  Laterality: Bilateral;   PORT-A-CATH REMOVAL Right 08/25/2013   Procedure: REMOVAL PORT-A-CATH;  Surgeon: Levert Ready, MD;  Location: [facility];  Service: General;  Laterality: Right;   PORTACATH PLACEMENT Right 11/23/2012   Procedure: INSERTION PORT-A-CATH;  Surgeon: Levert Ready, MD;  Location: [facility];  Service: General;  Laterality: Right;   TUBAL LIGATION     UPPER GI ENDOSCOPY     Social History   Tobacco Use   Smoking status: Every Day    Current packs/day:  0.50    Average packs/day: 0.5 packs/day for 33.0 years (16.5 ttl pk-yrs)    Types: Cigarettes   Smokeless tobacco: Never  Vaping Use   Vaping status: Never Used  Substance Use Topics   Alcohol use: Yes    Alcohol/week: 0.0 standard drinks of alcohol    Comment: beer rarely   Drug use: No   Family History  Problem Relation Age of Onset   Stroke Maternal Grandmother    Diabetes Maternal Grandmother    Diabetes Mother    Hypertension Mother    Leukemia Mother    Obesity Mother    Depression Sister    Bipolar disorder Son        Bipolar / oppositional defiant   Alcohol abuse Sister    Drug abuse Sister    Alcohol abuse Sister    Drug abuse Sister    Alcohol abuse Brother    Drug abuse Brother    Pancreatic cancer Brother    Colon cancer Neg Hx    Breast cancer Neg Hx    Allergies  Allergen Reactions   Gabapentin  Swelling   Lyrica  [Pregabalin ] Other (See Comments)    sedated   Voltaren  [Diclofenac  Sodium] Nausea And Vomiting   Bupropion Hcl Nausea Only and Other (See Comments)     GI, sleepy   Chlorhexidine  Gluconate Itching and Rash   Hydrocod Poli-Chlorphe Poli Er Nausea And Vomiting    vomiting   Lisinopril  Other (See Comments)    Leg cramps     Naproxen Sodium Other (See Comments)    does not tolerate   Current Outpatient Medications on File Prior to Visit  Medication Sig Dispense Refill   albuterol  (VENTOLIN  HFA) 108 (90 Base) MCG/ACT inhaler Inhale 2 puffs into the lungs every 4 (four) hours as needed for wheezing or shortness of breath. 1  each 1   aspirin  81 MG chewable tablet Chew 1 tablet (81 mg total) by mouth daily. 90 tablet 3   atorvastatin  (LIPITOR) 10 MG tablet TAKE 1 TABLET DAILY 90 tablet 0   b complex vitamins tablet Take 2 tablets by mouth daily.     calcium  carbonate (TUMS - DOSED IN MG ELEMENTAL CALCIUM ) 500 MG chewable tablet Chew by mouth as directed.     Calcium  Carbonate-Vitamin D  (CALCIUM  + D PO) Take 1 tablet by mouth daily.      ipratropium (ATROVENT) 0.03 % nasal spray Place 2 sprays into both nostrils every 12 (twelve) hours.     losartan  (COZAAR ) 25 MG tablet TAKE 1 TABLET DAILY 90 tablet 0   meloxicam  (MOBIC ) 15 MG tablet TAKE 1 TABLET DAILY. 90 tablet 1   Multiple Vitamins-Minerals (MULTIVITAMIN WOMEN 50+ PO) Take 1 tablet by mouth daily.     omeprazole  (PRILOSEC) 40 MG capsule Take 1 capsule (40 mg total) by mouth daily. 90 capsule 0   Probiotic Product (PROBIOTIC DAILY PO) Take 2 tablets by mouth daily.     nicotine  (NICODERM CQ  - DOSED IN MG/24 HOURS) 14 mg/24hr patch PLACE 1 PATCH ONTO THE SKIN DAILY. (Patient not taking: Reported on 12/30/2023) 28 patch 2   No current facility-administered medications on file prior to visit.    Review of Systems  Constitutional:  Positive for fatigue. Negative for activity change, appetite change, fever and unexpected weight change.  HENT:  Negative for congestion, ear pain, rhinorrhea, sinus pressure and sore throat.   Eyes:  Negative for pain, redness and visual disturbance.  Respiratory:  Negative for cough, shortness of breath and wheezing.   Cardiovascular:  Negative for chest pain and palpitations.  Gastrointestinal:  Negative for abdominal pain, blood in stool, constipation and diarrhea.  Endocrine: Negative for polydipsia and polyuria.  Genitourinary:  Negative for dysuria, frequency and urgency.  Musculoskeletal:  Negative for arthralgias, back pain and myalgias.  Skin:  Negative for pallor and rash.  Allergic/Immunologic: Negative for environmental allergies.  Neurological:  Negative for dizziness, syncope and headaches.  Hematological:  Negative for adenopathy. Does not bruise/bleed easily.  Psychiatric/Behavioral:  Negative for decreased concentration and dysphoric mood. The patient is not nervous/anxious.        Stressors        Objective:   Physical Exam Constitutional:      General: She is not in acute distress.    Appearance: Normal appearance. She  is well-developed and normal weight. She is not ill-appearing or diaphoretic.  HENT:     Head: Normocephalic and atraumatic.     Right Ear: Tympanic membrane, ear canal and external ear normal.     Left Ear: Tympanic membrane, ear canal and external ear normal.     Nose: Nose normal. No congestion.     Mouth/Throat:     Mouth: Mucous membranes are moist.     Pharynx: Oropharynx is clear. No posterior oropharyngeal erythema.  Eyes:     General: No scleral icterus.    Extraocular Movements: Extraocular movements intact.     Conjunctiva/sclera: Conjunctivae normal.     Pupils: Pupils are equal, round, and reactive to light.  Neck:     Thyroid : No thyromegaly.     Vascular: No carotid bruit or JVD.  Cardiovascular:     Rate and Rhythm: Normal rate and regular rhythm.     Pulses: Normal pulses.     Heart sounds: Normal heart sounds.     No  gallop.  Pulmonary:     Effort: Pulmonary effort is normal. No respiratory distress.     Breath sounds: Normal breath sounds. No wheezing.     Comments: Good air exch Chest:     Chest wall: No tenderness.  Abdominal:     General: Bowel sounds are normal. There is no distension or abdominal bruit.     Palpations: Abdomen is soft. There is no mass.     Tenderness: There is no abdominal tenderness.     Hernia: No hernia is present.  Genitourinary:    Comments: Breast exam: No mass, nodules, thickening, tenderness, bulging, retraction, inflamation, nipple discharge or skin changes noted.  No axillary or clavicular LA.     Baseline surgical changes noted  Musculoskeletal:        General: No tenderness. Normal range of motion.     Cervical back: Normal range of motion and neck supple. No rigidity. No muscular tenderness.     Right lower leg: No edema.     Left lower leg: No edema.     Comments: No kyphosis   Lymphadenopathy:     Cervical: No cervical adenopathy.  Skin:    General: Skin is warm and dry.     Coloration: Skin is not pale.      Findings: No erythema or rash.     Comments: Solar lentigines diffusely Scattered sks  Solar aging  Neurological:     Mental Status: She is alert. Mental status is at baseline.     Cranial Nerves: No cranial nerve deficit.     Motor: No abnormal muscle tone.     Coordination: Coordination normal.     Gait: Gait normal.     Deep Tendon Reflexes: Reflexes are normal and symmetric. Reflexes normal.  Psychiatric:        Mood and Affect: Mood normal.        Cognition and Memory: Cognition and memory normal.           Assessment & Plan:   Problem List Items Addressed This Visit       Cardiovascular and Mediastinum   Essential hypertension (Chronic)   bp in fair control at this time  BP Readings from Last 1 Encounters:  12/30/23 132/70  improved with addn of losartan  25 mg daily   No changes needed Most recent labs reviewed  Disc lifstyle change with low sodium diet and exercise  Strongly enc to quit smoking      Migraine headache   Gets more of these in change of season  Discussed need for more fluids Discussed need to quit smoking  Follow up if not improved         Respiratory   Smokers' cough (HCC)   No changes clinically or in exam Counseled for smoking cessation         Nervous and Auditory   Drug-induced polyneuropathy (HCC)   About the same  Good foot care         Musculoskeletal and Integument   Osteopenia   Dexa ordered at [facility] Past treatment with arimidex  =off it now  Discussed fall prevention, supplements and exercise for bone density   Cannot currently afford her supplements (ca and D)  Did refer for community care to help for that   No falls or fracture Discussed fall prevention, supplements and exercise for bone density   Handout given         Other   Vitamin D  deficiency   Last vitamin D   Lab Results  Component Value Date   VD25OH 21.97 (L) 12/22/2023   Unable to afford her D and ca supplement   Referral made to com care    Discussed fall prevention, supplements and exercise for bone density        TOBACCO USE   Disc in detail risks of smoking and possible outcomes including copd, vascular/ heart disease, cancer , respiratory and sinus infections as well as osteoporosis  Pt voices understanding 1ppd Not ready to quit        Routine general medical examination at a health care facility - Primary   Reviewed health habits including diet and exercise and skin cancer prevention Reviewed appropriate screening tests for age  Also reviewed health mt list, fam hx and immunization status , as well as social and family history   See HPI Labs reviewed and ordered Health Maintenance  Topic Date Due   Zoster (Shingles) Vaccine (2 of 2) 06/12/2018   Pneumococcal Vaccination (2 of 2 - PCV) 07/30/2018   COVID-19 Vaccine (3 - Moderna risk series) 12/27/2019   Colon Cancer Screening  10/20/2022   Flu Shot  03/26/2024   Mammogram  07/29/2024   Pap with HPV screening  12/04/2027   DTaP/Tdap/Td vaccine (4 - Td or Tdap) 11/17/2031   Hepatitis C Screening  Completed   HIV Screening  Completed   HPV Vaccine  Aged Out   Meningitis B Vaccine  Aged Out   Prevnar 20 given  Will get her 2nd shingrix at pharmacy  Dexa ordered  Colonoscopy referral done  Discussed fall prevention, supplements and exercise for bone density  Phq of 4 due to fatigue/long work hours  Did ref for comm care for financial insecurity      Pure hypercholesterolemia   Disc goals for lipids and reasons to control them Rev last labs with pt Rev low sat fat diet in detail  Some improvement with better compliance with atorvastatin  10 mg daily LDL of 88 up a bit  Problems affording healthy food  Continues atorvastatin  10 mg daily       Prediabetes   Lab Results  Component Value Date   HGBA1C 5.9 12/22/2023   HGBA1C 5.9 11/26/2022   HGBA1C 5.9 11/23/2021   disc imp of low glycemic diet and wt loss to prevent DM2   Financial insecurity  causes issues getting healthy food       History of thrombocytopenia   Normal plt ct of 215 today      Financially insecure   Pt has trouble affording some medicines  Primarily ca and D for osteopenia  Also food-not eating well due to this  Referral made to comm care       Relevant Orders   AMB Referral VBCI Care Management   Estrogen deficiency   Dexa ordered       Relevant Orders   DG Bone Density   Current use of proton pump inhibitor   Lab Results  Component Value Date   VITAMINB12 979 (H) 12/22/2023   Last vitamin D  Lab Results  Component Value Date   VD25OH 21.97 (L) 12/22/2023    Having trouble affording vit D Comm care referral done for this       Colon cancer screening   Due for colonoscopy  Referral done       Relevant Orders   Ambulatory referral to Gastroenterology   ANXIETY DEPRESSION   Other Visit Diagnoses       Need for vaccination against  Streptococcus pneumoniae       Relevant Orders   Pneumococcal conjugate vaccine 20-valent (Prevnar 20) (Completed)

## 2024-01-02 ENCOUNTER — Telehealth: Payer: Self-pay

## 2024-01-02 NOTE — Progress Notes (Signed)
   Telephone encounter was:  Unsuccessful.  01/02/2024 Name: DENEKA REEH MRN: [id number] DOB: [birth year]  Unsuccessful outbound call made today to assist with:  Financial Difficulties related to financial strain  Outreach Attempt:  1st Attempt  No answer, unable to leave a message    Azell Leopard [facility]  [facility] Guide, Phone: [phone] Fax: [phone] Website: [email or web address]

## 2024-01-05 ENCOUNTER — Other Ambulatory Visit (INDEPENDENT_AMBULATORY_CARE_PROVIDER_SITE_OTHER): Admitting: Pharmacist

## 2024-01-05 ENCOUNTER — Telehealth: Payer: Self-pay

## 2024-01-05 DIAGNOSIS — M8589 Other specified disorders of bone density and structure, multiple sites: Secondary | ICD-10-CM

## 2024-01-05 NOTE — Progress Notes (Unsigned)
   01/06/2024 Name: Amy Leonard MRN: [id number] DOB: 05-01-1961  Subjective  Chief Complaint  Patient presents with   Medication Access    Care Team: Primary Care Provider: Tower, Manley Seeds, MD  Reason for visit: ?  Amy Leonard is a 63 y.o. female who presents today for a telephone visit with the pharmacist due to medication access concerns.   Medication Access: ?  Reports that all medications are not affordable.  Calcium /vitamin D  supplement   Reports insurance does cover the vitamin D /Ca but cannot swallow the pills so she had been using Calcitrate chews. She felt these worked well for her but she cannot afford at this point as expenses have been tighter.   $20 for a 90 day supply of chronic prescriptions meds (atorvastatin  + lisinopril ) Vitamin B 1000 mg per day  Nicotine  patches are covered by insurance Aspirin  (pays ~$7-8 per bottle which she reports is manageable).    Notes her income is too high for Medicaid, and she owns a home which exceeds asset limit.  Plans to retire right at 60 then will get Medicare.   Current Outpatient Medications on File Prior to Visit  Medication Sig   albuterol  (VENTOLIN  HFA) 108 (90 Base) MCG/ACT inhaler Inhale 2 puffs into the lungs every 4 (four) hours as needed for wheezing or shortness of breath.   aspirin  81 MG chewable tablet Chew 1 tablet (81 mg total) by mouth daily.   atorvastatin  (LIPITOR) 10 MG tablet TAKE 1 TABLET DAILY   b complex vitamins tablet Take 2 tablets by mouth daily.   calcium  carbonate (TUMS - DOSED IN MG ELEMENTAL CALCIUM ) 500 MG chewable tablet Chew by mouth as directed.   Calcium  Carbonate-Vitamin D  (CALCIUM  + D PO) Take 1 tablet by mouth daily.   ipratropium (ATROVENT) 0.03 % nasal spray Place 2 sprays into both nostrils every 12 (twelve) hours.   losartan  (COZAAR ) 25 MG tablet TAKE 1 TABLET DAILY   meloxicam  (MOBIC ) 15 MG tablet TAKE 1 TABLET DAILY.   Multiple Vitamins-Minerals (MULTIVITAMIN WOMEN 50+ PO)  Take 1 tablet by mouth daily.   nicotine  (NICODERM CQ  - DOSED IN MG/24 HOURS) 14 mg/24hr patch PLACE 1 PATCH ONTO THE SKIN DAILY. (Patient not taking: Reported on 12/30/2023)   omeprazole  (PRILOSEC) 40 MG capsule Take 1 capsule (40 mg total) by mouth daily.   Probiotic Product (PROBIOTIC DAILY PO) Take 2 tablets by mouth daily.   No current facility-administered medications on file prior to visit.   Prescription drug coverage: YES Payor: BLUE CROSS BLUE SHIELD / Plan: BCBS COMM PPO / Product Type: *No Product type* / .   Assessment and Plan:   1. Medication Access Discussed [facility] free pharmacy program, though unfortunately closest county they host events in is [facility]. Would provide free OTC medications to her.  We reviewed  community pharmacy as a good option for very low-cost OTC medications such as her aspirin . If she cannot find calcitrate chews specifically, may be able to find tums or other forms of calcium /D.   Follow up: F/u as scheduled with social worker 01/30/24  Future Appointments  Date Time Provider Department Center  01/30/2024  9:00 AM Green, Davina E, RN [facility] None    Daron Ellen, PharmD Clinical Pharmacist [facility] [phone]

## 2024-01-05 NOTE — Progress Notes (Signed)
   Telephone encounter was:  Successful.  Complex Care Management Note Care Guide Note  01/05/2024 Name: Amy Leonard MRN: [id number] DOB: Apr 24, 1961  Amy Leonard is a 63 y.o. year old female who is a primary care patient of Tower, Manley Seeds, MD . The community resource team was consulted for assistance with Financial Difficulties related to financial strain  SDOH screenings and interventions completed:  No        Care guide performed the following interventions: Patient provided with information about care guide support team and interviewed to confirm resource needs.Pt requested I call back friday after lunch   Follow Up Plan:  Care guide will follow up with patient by phone over the next week  Encounter Outcome:  Patient Request to Call Back    Azell Leopard [facility]  [facility] Guide, Phone: [phone] Fax: [phone] Website: [email or web address]

## 2024-01-12 ENCOUNTER — Telehealth: Payer: Self-pay

## 2024-01-12 NOTE — Progress Notes (Signed)
   Telephone encounter was:  Unsuccessful.  01/12/2024 Name: Amy Leonard MRN: [id number] DOB: 1960-11-03  Unsuccessful outbound call made today to assist with:  Financial Difficulties related to financial strain  Outreach Attempt:  3rd Attempt.  Referral closed unable to contact patient.  No answer, unable to leave a message   Azell Leopard [facility]  [facility] Guide, Phone: [phone] Fax: [phone] Website: [email or web address]

## 2024-01-14 NOTE — Progress Notes (Signed)
 Complex Care Management Note  Care Guide Note 01/14/2024 Name: Amy Leonard MRN: [id number] DOB: [birth year]  Freeman Jersey Lundquist is a 63 y.o. year old female who sees Tower, Manley Seeds, MD for primary care. I reached out to Ely Hampshire by phone today to offer complex care management services.  Ms. Revelle was given information about Complex Care Management services today including:   The Complex Care Management services include support from the care team which includes your Nurse Care Manager, Clinical Social Worker, or Pharmacist.  The Complex Care Management team is here to help remove barriers to the health concerns and goals most important to you. Complex Care Management services are voluntary, and the patient may decline or stop services at any time by request to their care team member.   Complex Care Management Consent Status: Patient agreed to services and verbal consent obtained.   Follow up plan:  Telephone appointment with complex care management team member scheduled for:  01/30/2024 Pharm d 01/05/2024  Encounter Outcome:  Patient Scheduled  Lenton Rail , RMA     [place]  [facility], [facility] Guide  Direct Dial: [phone]  Website: [email or web address]

## 2024-01-30 ENCOUNTER — Telehealth: Payer: Self-pay

## 2024-02-17 ENCOUNTER — Telehealth: Payer: Self-pay

## 2024-02-17 NOTE — Progress Notes (Signed)
 Complex Care Management Care Guide Note  02/17/2024 Name: MAURIA ASQUITH MRN: [id number] DOB: 1961/01/06  [name] is a 63 y.o. year old female who is a primary care patient of Tower, Laine LABOR, MD and is actively engaged with the care management team. I reached out to Pat Amy Filbert by phone today to assist with re-scheduling  with the RN Case Manager.  Follow up plan: Telephone appointment with complex care management team member scheduled for:  02/26/2024  Jeoffrey Buffalo , RMA     [place]  [facility], [facility] Guide  Direct Dial: [phone]  Website: [email or web address]

## 2024-02-25 ENCOUNTER — Other Ambulatory Visit: Payer: Self-pay | Admitting: Family Medicine

## 2024-02-26 ENCOUNTER — Other Ambulatory Visit: Payer: Self-pay

## 2024-02-26 DIAGNOSIS — M8580 Other specified disorders of bone density and structure, unspecified site: Secondary | ICD-10-CM

## 2024-02-26 NOTE — Patient Outreach (Signed)
 Complex Care Management   Visit Note  02/26/2024  Name:  Amy Leonard MRN: [id number] DOB: [birth year]  Situation: Referral received for Complex Care Management related to HTN I obtained verbal consent from Patient.  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   History of chemotherapy 2014   BREAST CA   HLD (hyperlipidemia)    HSV infection    recurrent (side and buttox)   Hypertension    Migraine    Personal history of chemotherapy 2014   bilateral breast ca   Personal history of radiation therapy 2014   bilateral breast ca   Radiation 2014   BREAST CA   Tobacco abuse    Wears dentures    top    Assessment: Patient Reported Symptoms:  Cognitive Cognitive Status: Alert and oriented to person, place, and time, Insightful and able to interpret abstract concepts, Normal speech and language skills   Health Maintenance Behaviors: Annual physical exam, Exercise Healing Pattern: Average Health Facilitated by: Rest  Neurological Neurological Review of Symptoms: Headaches, Dizziness (History migraines dizzy if bending too long) Neurological Management Strategies: Adequate rest, Fluid modification, Routine screening, Medication therapy (tylenol  for migraines) Neurological Self-Management Outcome: 3 (uncertain)  HEENT HEENT Symptoms Reported: No symptoms reported, Frequent sneezing, Nasal discharge HEENT Comment: seasonal and pet allergies    Cardiovascular Cardiovascular Symptoms Reported: No symptoms reported Does patient have uncontrolled  Hypertension?: No    Respiratory Respiratory Symptoms Reported: Other: (nasal congestion) Other Respiratory Symptoms: seasonal and pet allergies Respiratory Management Strategies: Medication therapy  Endocrine Endocrine Symptoms Reported: No symptoms reported Is patient diabetic?: No Endocrine Self-Management Outcome: 3 (uncertain) Endocrine Comment: Discussed Pre-DM, education regarding diet, low salt, limiting processed foods, low carb/sugar  Gastrointestinal Gastrointestinal Symptoms Reported: No symptoms reported      Genitourinary Genitourinary Symptoms Reported: Incontinence Additional Genitourinary Details: at times incontinence due to too much coffee Genitourinary Management Strategies: Incontinence garment/pad (prn use of pads)  Integumentary Integumentary Symptoms Reported: No symptoms reported Additional Integumentary Details: states often does gets scrapes and bruises from work but heal Skin Comment: discussed quickly clean any abrasion with soap and water and cover  Musculoskeletal Musculoskelatal Symptoms Reviewed: Back pain, Joint pain, Unsteady gait Additional Musculoskeletal Details: reports knee hip and foot - if pai nis bacd, altered gait and off balance, states off balance in yard - will consider cane/walking stick prn Musculoskeletal Management Strategies: Adequate rest, Exercise, Routine screening, Medication therapy Musculoskeletal Self-Management Outcome: 3 (uncertain) Musculoskeletal Comment: reports left foot, right arm and right knee pain at times due to work/activity Falls in the past year?: No Number of falls in past year: 1 or less Was there an injury with Fall?: No Fall Risk Category Calculator: 0 Patient Fall Risk Level: Low Fall Risk Patient at Risk for Falls Due to: Impaired balance/gait, 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 work) Fall risk Follow up: Falls evaluation completed, Falls prevention discussed  Psychosocial  Psychosocial Symptoms Reported: No symptoms reported Additional Psychological Details: concern regarding finances Behavioral Health Self-Management Outcome: 3 (uncertain) Behavioral Health Comment: will schedule again with BSW Major Change/Loss/Stressor/Fears (CP): Resources Quality of Family Relationships: supportive Do you feel physically threatened by others?: No      02/26/2024    9:34 AM  Depression screen PHQ 2/9  Decreased Interest 1  Down, Depressed, Hopeless 0  PHQ - 2 Score 1    There were no vitals filed for this visit.  Medications Reviewed Today   Medications were not reviewed in this encounter     Recommendation:   PCP Follow-up Continue Current Plan of Care  Follow Up Plan:   Telephone follow-up 2 [name]  Nestora Duos, MSN, RN [facility]  [facility], [facility] RN Care Manager Direct Dial: [phone] Fax: [phone]

## 2024-02-26 NOTE — Patient Outreach (Signed)
 Complex Care Management   Visit Note  02/26/2024  Name:  Amy Leonard MRN: [id number] DOB: 1961-08-23  Situation: Referral received for Complex Care Management related to HTN I obtained verbal consent from Patient.  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   History of chemotherapy 2014   BREAST CA   HLD (hyperlipidemia)    HSV infection    recurrent (side and buttox)   Hypertension    Migraine    Personal history of chemotherapy 2014   bilateral breast ca   Personal history of radiation therapy 2014   bilateral breast ca   Radiation 2014   BREAST CA   Tobacco abuse    Wears dentures    top    Assessment: Patient Reported Symptoms:  Cognitive Cognitive Status: Alert and oriented to person, place, and time, Insightful and able to interpret abstract concepts, Normal speech and language skills   Health Maintenance Behaviors: Annual physical exam, Exercise Healing Pattern: Average Health Facilitated by: Rest  Neurological Neurological Review of Symptoms: Headaches, Dizziness (History migraines dizzy if bending too long) Neurological Management Strategies: Adequate rest, Fluid modification, Routine screening, Medication therapy (tylenol  for migraines) Neurological Self-Management Outcome: 3 (uncertain)  HEENT HEENT Symptoms Reported: No symptoms reported, Frequent sneezing, Nasal discharge HEENT Comment: seasonal and pet allergies    Cardiovascular Cardiovascular Symptoms Reported: No symptoms reported Does patient have uncontrolled  Hypertension?: No    Respiratory Respiratory Symptoms Reported: Other: (nasal congestion) Other Respiratory Symptoms: seasonal and pet allergies Respiratory Management Strategies: Medication therapy  Endocrine Endocrine Symptoms Reported: No symptoms reported Is patient diabetic?: No Endocrine Self-Management Outcome: 3 (uncertain) Endocrine Comment: Discussed Pre-DM, education regarding diet, low salt, limiting processed foods, low carb/sugar  Gastrointestinal Gastrointestinal Symptoms Reported: No symptoms reported      Genitourinary Genitourinary Symptoms Reported: Incontinence Additional Genitourinary Details: at times incontinence due to too much coffee Genitourinary Management Strategies: Incontinence garment/pad (prn use of pads)  Integumentary Integumentary Symptoms Reported: No symptoms reported Additional Integumentary Details: states often does gets scrapes and bruises from work but heal Skin Comment: discussed quickly clean any abrasion with soap and water and cover  Musculoskeletal Musculoskelatal Symptoms Reviewed: Back pain, Joint pain, Unsteady gait Additional Musculoskeletal Details: reports knee hip and foot - if pai nis bacd, altered gait and off balance, states off balance in yard - will consider cane/walking stick prn Musculoskeletal Management Strategies: Adequate rest, Exercise, Routine screening, Medication therapy Musculoskeletal Self-Management Outcome: 3 (uncertain) Musculoskeletal Comment: reports left foot, right arm and right knee pain at times due to work/activity Falls in the past year?: No Number of falls in past year: 1 or less Was there an injury with Fall?: No Fall Risk Category Calculator: 0 Patient Fall Risk Level: Low Fall Risk Patient at Risk for Falls Due to: Impaired balance/gait, 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 work) Fall risk Follow up: Falls evaluation completed, Falls prevention discussed  Psychosocial  Psychosocial Symptoms Reported: No symptoms reported Additional Psychological Details: concern regarding finances Behavioral Health Self-Management Outcome: 3 (uncertain) Behavioral Health Comment: will schedule again with BSW Major Change/Loss/Stressor/Fears (CP): Resources Quality of Family Relationships: supportive Do you feel physically threatened by others?: No      02/26/2024    9:34 AM  Depression screen PHQ 2/9  Decreased Interest 1  Down, Depressed, Hopeless 0  PHQ - 2 Score 1    There were no vitals filed for this visit.  Medications Reviewed Today   Medications were not reviewed in this encounter     Recommendation:   PCP Follow-up Continue Current Plan of Care  Follow Up Plan:   Telephone follow-up 2 Oaklyn Mans  Nestora Duos, MSN, RN [facility]  [facility], [facility] RN Care Manager Direct Dial: [phone] Fax: [phone]

## 2024-02-26 NOTE — Patient Instructions (Signed)
 Visit Information  Thank you for taking time to visit with me today. Please don't hesitate to contact me if I can be of assistance to you before our next scheduled appointment.  Our next appointment is by telephone on 03/11/2024 at 10:00 am Please call the care guide team at [phone] if you need to cancel or reschedule your appointment.   Following is a copy of your care plan:   Goals Addressed             This Visit's Progress    [facility] RN Care Plan       Problems:  Chronic Disease Management support and education needs related to HTN  Goal: Over the next 90 days the Patient will attend all scheduled medical appointments: PCP as evidenced by chart review no missed appointments        continue to work with RN Care Manager and/or Social Worker to address care management and care coordination needs related to HTN as evidenced by adherence to care management team scheduled appointments     take all medications exactly as prescribed and will call provider for medication related questions as evidenced by verbalizing compliance with medications    verbalize basic understanding of HTN disease process and self health management plan as evidenced by verbalizing signs and symptoms requiring call to PCP and red flags requiring ED, lifestyle modifications including diet, declines smoking interventions but states considering due to difficulty affording  Interventions:   Hypertension Interventions: Last practice recorded BP readings:  BP Readings from Last 3 Encounters:  12/30/23 132/70  09/05/23 (!) 144/76  05/01/23 132/80   Most recent eGFR/CrCl: No results found for: EGFR  No components found for: CRCL  Provided education to patient re: stroke prevention, s/s of heart attack and stroke Reviewed medications with patient and discussed importance of compliance Counseled on the importance of exercise goals with target of 150 minutes per week Discussed plans with patient for ongoing care  management follow up and provided patient with direct contact information for care management team Advised patient, providing education and rationale, to monitor blood pressure daily and record, calling PCP for findings outside established parameters Provided education on prescribed diet Low salt, low carb (pre-diabetic) Discussed complications of poorly controlled blood pressure such as heart disease, stroke, circulatory complications, vision complications, kidney impairment, sexual dysfunction Screening for signs and symptoms of depression related to chronic disease state  Assessed social determinant of health barriers  Patient Self-Care Activities:  Attend all scheduled provider appointments Call pharmacy for medication refills 3-7 days in advance of running out of medications Call provider office for new concerns or questions  Take medications as prescribed   check blood pressure 3 times per week learn about high blood pressure keep a blood pressure log take blood pressure log to all doctor appointments call doctor for signs and symptoms of high blood pressure keep all doctor appointments take medications for blood pressure exactly as prescribed report new symptoms to your doctor eat more whole grains, fruits and vegetables, lean meats and healthy fats  Plan:  Telephone follow up appointment with care management team member scheduled for:  03/11/2024 at 10:00 am          VBCI RN Care Plan - HTN       Problems:  Chronic Disease Management support and education needs related to HTN  Goal: Over the next 90 days the Patient will attend all scheduled medical appointments: PCP as evidenced by no missed appointments per chart review  continue to work with Medical illustrator and/or Social Worker to address care management and care coordination needs related to HTN as evidenced by adherence to care management team scheduled appointments     take all medications exactly as prescribed  and will call provider for medication related questions as evidenced by Patient verbalizing compliance with all medications    verbalize basic understanding of HTN disease process and self health management plan as evidenced by patient verbalizing signs and symptoms to report to PCP and red flags requiring immediate eval/ED, lifestyle modifications including low salt diet, planned exercise, smoking cessation when ready  Interventions:   Hypertension Interventions: Last practice recorded BP readings:  BP Readings from Last 3 Encounters:  12/30/23 132/70  09/05/23 (!) 144/76  05/01/23 132/80   Most recent eGFR/CrCl: No results found for: EGFR  No components found for: CRCL  Provided education to patient re: stroke prevention, s/s of heart attack and stroke Reviewed medications with patient and discussed importance of compliance Advised patient, providing education and rationale, to monitor blood pressure daily and record, calling PCP for findings outside established parameters Provided education on prescribed diet low salt, low carb (prediabetic) Screening for signs and symptoms of depression related to chronic disease state  Assessed social determinant of health barriers: Referral to BSW for financial concerns. Patient requesitng call from BSW to schedule as her work schedule changes.   Patient Self-Care Activities:  Attend all scheduled provider appointments Call pharmacy for medication refills 3-7 days in advance of running out of medications Call provider office for new concerns or questions  Take medications as prescribed   check blood pressure 3 times per week learn about high blood pressure keep a blood pressure log take blood pressure log to all doctor appointments call doctor for signs and symptoms of high blood pressure keep all doctor appointments take medications for blood pressure exactly as prescribed eat more whole grains, fruits and vegetables, lean meats and  healthy fats  Plan:  Telephone follow up appointment with care management team member scheduled for:  03/11/2024 at 10:00 am             Please call the Suicide and Crisis Lifeline: 988 call the [place]  National Suicide Prevention Lifeline: 504 849 6335 or TTY: 210-025-2030 TTY 5065948898) to talk to a trained counselor call 1-800-273-TALK (toll free, 24 hour hotline) go to [facility] [address], [place] [phone]) call 911 if you are experiencing a Mental Health or Behavioral Health Crisis or need someone to talk to.  Patient verbalizes understanding of instructions and care plan provided today and agrees to view in MyChart. Active MyChart status and patient understanding of how to access instructions and care plan via MyChart confirmed with patient.     Nestora Duos, MSN, RN [facility], [facility] RN Care Manager Direct Dial: [phone] Fax: [phone]  Hypertension, Adult Hypertension is another name for high blood pressure. High blood pressure forces your heart to work harder to pump blood. This can cause problems over time. There are two numbers in a blood pressure reading. There is a top number (systolic) over a bottom number (diastolic). It is best to have a blood pressure that is below 120/80. What are the causes? The cause of this condition is not known. Some other conditions can lead to high blood pressure. What increases the risk? Some lifestyle factors can make you more likely to develop high blood pressure: Smoking. Not getting enough exercise or  physical activity. Being overweight. Having too much fat, sugar, calories, or salt (sodium) in your diet. Drinking too much alcohol. Other risk factors include: Having any of these conditions: Heart disease. Diabetes. High cholesterol. Kidney disease. Obstructive sleep apnea. Having a family history of high blood pressure  and high cholesterol. Age. The risk increases with age. Stress. What are the signs or symptoms? High blood pressure may not cause symptoms. Very high blood pressure (hypertensive crisis) may cause: Headache. Fast or uneven heartbeats (palpitations). Shortness of breath. Nosebleed. Vomiting or feeling like you may vomit (nauseous). Changes in how you see. Very bad chest pain. Feeling dizzy. Seizures. How is this treated? This condition is treated by making healthy lifestyle changes, such as: Eating healthy foods. Exercising more. Drinking less alcohol. Your doctor may prescribe medicine if lifestyle changes do not help enough and if: Your top number is above 130. Your bottom number is above 80. Your personal target blood pressure may vary. Follow these instructions at home: Eating and drinking  If told, follow the DASH eating plan. To follow this plan: Fill one half of your plate at each meal with fruits and vegetables. Fill one fourth of your plate at each meal with whole grains. Whole grains include whole-wheat pasta, brown rice, and whole-grain bread. Eat or drink low-fat dairy products, such as skim milk or low-fat yogurt. Fill one fourth of your plate at each meal with low-fat (lean) proteins. Low-fat proteins include fish, chicken without skin, eggs, beans, and tofu. Avoid fatty meat, cured and processed meat, or chicken with skin. Avoid pre-made or processed food. Limit the amount of salt in your diet to less than 1,500 mg each day. Do not drink alcohol if: Your doctor tells you not to drink. You are pregnant, may be pregnant, or are planning to become pregnant. If you drink alcohol: Limit how much you have to: 0-1 drink a day for women. 0-2 drinks a day for men. Know how much alcohol is in your drink. In the [place] one drink equals one 12 oz bottle of beer (355 mL), one 5 oz glass of wine (148 mL), or one 1 oz glass of hard liquor (44 mL). Lifestyle  Work with your  doctor to stay at a healthy weight or to lose weight. Ask your doctor what the best weight is for you. Get at least 30 minutes of exercise that causes your heart to beat faster (aerobic exercise) most days of the week. This may include walking, swimming, or biking. Get at least 30 minutes of exercise that strengthens your muscles (resistance exercise) at least 3 days a week. This may include lifting weights or doing Pilates. Do not smoke or use any products that contain nicotine  or tobacco. If you need help quitting, ask your doctor. Check your blood pressure at home as told by your doctor. Keep all follow-up visits. Medicines Take over-the-counter and prescription medicines only as told by your doctor. Follow directions carefully. Do not skip doses of blood pressure medicine. The medicine does not work as well if you skip doses. Skipping doses also puts you at risk for problems. Ask your doctor about side effects or reactions to medicines that you should watch for. Contact a doctor if: You think you are having a reaction to the medicine you are taking. You have headaches that keep coming back. You feel dizzy. You have swelling in your ankles. You have trouble with your vision. Get help right away if: You get a very bad headache. You  start to feel mixed up (confused). You feel weak or numb. You feel faint. You have very bad pain in your: Chest. Belly (abdomen). You vomit more than once. You have trouble breathing. These symptoms may be an emergency. Get help right away. Call 911. Do not wait to see if the symptoms will go away. Do not drive yourself to the hospital. Summary Hypertension is another name for high blood pressure. High blood pressure forces your heart to work harder to pump blood. For most people, a normal blood pressure is less than 120/80. Making healthy choices can help lower blood pressure. If your blood pressure does not get lower with healthy choices, you may need  to take medicine. This information is not intended to replace advice given to you by your health care provider. Make sure you discuss any questions you have with your health care provider. Document Revised: 05/31/2021 Document Reviewed: 05/31/2021 Elsevier Patient Education  2024 Elsevier Inc.  Low-Sodium Eating Plan Salt (sodium) helps you keep a healthy balance of fluids in your body. Too much sodium can raise your blood pressure. It can also cause fluid and waste to be held in your body. Your health care provider or dietitian may recommend a low-sodium eating plan if you have high blood pressure (hypertension), kidney disease, liver disease, or heart failure. Eating less sodium can help lower your blood pressure and reduce swelling. It can also protect your heart, liver, and kidneys. What are tips for following this plan? Reading food labels  Check food labels for the amount of sodium per serving. If you eat more than one serving, you must multiply the listed amount by the number of servings. Choose foods with less than 140 milligrams (mg) of sodium per serving. Avoid foods with 300 mg of sodium or more per serving. Always check how much sodium is in a product, even if the label says unsalted or no salt added. Shopping  Buy products labeled as low-sodium or no salt added. Buy fresh foods. Avoid canned foods and pre-made or frozen meals. Avoid canned, cured, or processed meats. Buy breads that have less than 80 mg of sodium per slice. Cooking  Eat more home-cooked food. Try to eat less restaurant, buffet, and fast food. Try not to add salt when you cook. Use salt-free seasonings or herbs instead of table salt or sea salt. Check with your provider or pharmacist before using salt substitutes. Cook with plant-based oils, such as canola, sunflower, or olive oil. Meal planning When eating at a restaurant, ask if your food can be made with less salt or no salt. Avoid dishes labeled  as brined, pickled, cured, or smoked. Avoid dishes made with soy sauce, miso, or teriyaki sauce. Avoid foods that have monosodium glutamate (MSG) in them. MSG may be added to some restaurant food, sauces, soups, bouillon, and canned foods. Make meals that can be grilled, baked, poached, roasted, or steamed. These are often made with less sodium. General information Try to limit your sodium intake to 1,500-2,300 mg each day, or the amount told by your provider. What foods should I eat? Fruits Fresh, frozen, or canned fruit. Fruit juice. Vegetables Fresh or frozen vegetables. No salt added canned vegetables. No salt added tomato sauce and paste. Low-sodium or reduced-sodium tomato and vegetable juice. Grains Low-sodium cereals, such as oats, puffed wheat and rice, and shredded wheat. Low-sodium crackers. Unsalted rice. Unsalted pasta. Low-sodium bread. Whole grain breads and whole grain pasta. Meats and other proteins Fresh or frozen meat, poultry,  seafood, and fish. These should have no added salt. Low-sodium canned tuna and salmon. Unsalted nuts. Dried peas, beans, and lentils without added salt. Unsalted canned beans. Eggs. Unsalted nut butters. Dairy Milk. Soy milk. Cheese that is naturally low in sodium, such as ricotta cheese, fresh mozzarella, or Swiss cheese. Low-sodium or reduced-sodium cheese. Cream cheese. Yogurt. Seasonings and condiments Fresh and dried herbs and spices. Salt-free seasonings. Low-sodium mustard and ketchup. Sodium-free salad dressing. Sodium-free light mayonnaise. Fresh or refrigerated horseradish. Lemon juice. Vinegar. Other foods Homemade, reduced-sodium, or low-sodium soups. Unsalted popcorn and pretzels. Low-salt or salt-free chips. The items listed above may not be all the foods and drinks you can have. Talk to a dietitian to learn more. What foods should I avoid? Vegetables Sauerkraut, pickled vegetables, and relishes. Olives. [place] fries. Onion rings.  Regular canned vegetables, except low-sodium or reduced-sodium items. Regular canned tomato sauce and paste. Regular tomato and vegetable juice. Frozen vegetables in sauces. Grains Instant hot cereals. Bread stuffing, pancake, and biscuit mixes. Croutons. Seasoned rice or pasta mixes. Noodle soup cups. Boxed or frozen macaroni and cheese. Regular salted crackers. Self-rising flour. Meats and other proteins Meat or fish that is salted, canned, smoked, spiced, or pickled. Precooked or cured meat, such as sausages or meat loaves. [place]. Ham. Pepperoni. Hot dogs. Corned beef. Chipped beef. Salt pork. Jerky. Pickled herring, anchovies, and sardines. Regular canned tuna. Salted nuts. Dairy Processed cheese and cheese spreads. Hard cheeses. Cheese curds. Blue cheese. Feta cheese. String cheese. Regular cottage cheese. Buttermilk. Canned milk. Fats and oils Salted butter. Regular margarine. Ghee. Bacon fat. Seasonings and condiments Onion salt, garlic salt, seasoned salt, table salt, and sea salt. Canned and packaged gravies. Worcestershire sauce. Tartar sauce. Barbecue sauce. Teriyaki sauce. Soy sauce, including reduced-sodium soy sauce. Steak sauce. Fish sauce. Oyster sauce. Cocktail sauce. Horseradish that you find on the shelf. Regular ketchup and mustard. Meat flavorings and tenderizers. Bouillon cubes. Hot sauce. Pre-made or packaged marinades. Pre-made or packaged taco seasonings. Relishes. Regular salad dressings. Salsa. Other foods Salted popcorn and pretzels. Corn chips and puffs. Potato and tortilla chips. Canned or dried soups. Pizza. Frozen entrees and pot pies. The items listed above may not be all the foods and drinks you should avoid. Talk to a dietitian to learn more. This information is not intended to replace advice given to you by your health care provider. Make sure you discuss any questions you have with your health care provider. Document Revised: 08/29/2022 Document Reviewed:  08/29/2022 Elsevier Patient Education  2024 Elsevier Inc.  Osteopenia (Why recommending Vit D and Calcium  - Foods to eat to help)  Osteopenia is a loss of thickness (density) inside the bones. Another name for osteopenia is low bone mass. Mild osteopenia is a normal part of aging. It is not a disease, and it does not cause symptoms. However, if you have osteopenia and continue to lose bone mass, you could develop a condition that causes the bones to become thin and break more easily (osteoporosis). Osteoporosis can cause you to lose some height, have back pain, and have a stooped posture. Although osteopenia is not a disease, making changes to your lifestyle and diet can help to prevent osteopenia from developing into osteoporosis. What are the causes? Osteopenia is caused by loss of calcium  in the bones. Bones are constantly changing. Old bone cells are continually being replaced with new bone cells. This process builds new bone. The mineral calcium  is needed to build new bone and maintain bone density. Bone  density is usually highest around age 23. After that, most people's bodies cannot replace all the bone they have lost with new bone. What increases the risk? You are more likely to develop this condition if: You are older than age 10. You are a woman who went through menopause early. You have a long illness that keeps you in bed. You do not get enough exercise. You lack certain nutrients (malnutrition). You have an overactive thyroid  gland (hyperthyroidism). You use products that contain nicotine  or tobacco, such as cigarettes, e-cigarettes and chewing tobacco, or you drink a lot of alcohol. You are taking medicines that weaken the bones, such as steroids. What are the signs or symptoms? This condition does not cause any symptoms. You may have a slightly higher risk for bone breaks (fractures), so getting fractures more easily than normal may be an indication of osteopenia. How is this  diagnosed? This condition may be diagnosed based on an X-ray exam that measures bone density (dual-energy X-ray absorptiometry, or DEXA). This test can measure bone density in your hips, spine, and wrists. Osteopenia has no symptoms, so this condition is usually diagnosed after a routine bone density screening test is done for osteoporosis. This routine screening is usually done for: Women who are age 52 or older. Men who are age 73 or older. If you have risk factors for osteopenia, you may have the screening test at an earlier age. How is this treated? Making dietary and lifestyle changes can lower your risk for osteoporosis. If you have severe osteopenia that is close to becoming osteoporosis, this condition can be treated with medicines and dietary supplements such as calcium  and vitamin D . These supplements help to rebuild bone density. Follow these instructions at home: Eating and drinking Eat a diet that is high in calcium  and vitamin D . Calcium  is found in dairy products, beans, salmon, and leafy green vegetables like spinach and broccoli. Look for foods that have vitamin D  and calcium  added to them (fortified foods), such as orange juice, cereal, and bread.  Lifestyle Do 30 minutes or more of a weight-bearing exercise every day, such as walking, jogging, or playing a sport. These types of exercises strengthen the bones. Do not use any products that contain nicotine  or tobacco, such as cigarettes, e-cigarettes, and chewing tobacco. If you need help quitting, ask your health care provider. Do not drink alcohol if: Your health care provider tells you not to drink. You are pregnant, may be pregnant, or are planning to become pregnant. If you drink alcohol: Limit how much you use to: 0-1 drink a day for women. 0-2 drinks a day for men. Be aware of how much alcohol is in your drink. In the [place] one drink equals one 12 oz bottle of beer (355 mL), one 5 oz glass of wine (148 mL), or one 1  oz glass of hard liquor (44 mL). General instructions Take over-the-counter and prescription medicines only as told by your health care provider. These include vitamins and supplements. Take precautions at home to lower your risk of falling, such as: Keeping rooms well-lit and free of clutter, such as cords. Installing safety rails on stairs. Using rubber mats in the bathroom or other areas that are often wet or slippery. Keep all follow-up visits. This is important. Contact a health care provider if: You have not had a bone density screening for osteoporosis and you are: A woman who is age 17 or older. A man who is age 44 or older. You  are a postmenopausal woman who has not had a bone density screening for osteoporosis. You are older than age 24 and you want to know if you should have bone density screening for osteoporosis. Summary Osteopenia is a loss of thickness (density) inside the bones. Another name for osteopenia is low bone mass. Osteopenia is not a disease, but it may increase your risk for a condition that causes the bones to become thin and break more easily (osteoporosis). You may be at risk for osteopenia if you are older than age 39 or if you are a woman who went through early menopause. Osteopenia does not cause any symptoms, but it can be diagnosed with a bone density screening test. Dietary and lifestyle changes are the first treatment for osteopenia. These may lower your risk for osteoporosis. This information is not intended to replace advice given to you by your health care provider. Make sure you discuss any questions you have with your health care provider. Document Revised: 04/29/2023 Document Reviewed: 04/29/2023 Elsevier Patient Education  2025 [employer].  Fall Prevention in the Home, Adult Falls can cause injuries and can happen to people of all ages. There are many things you can do to make your home safer and to help prevent falls. What actions can I take  to prevent falls? General information Use good lighting in all rooms. Make sure to: Replace any light bulbs that burn out. Turn on the lights in dark areas and use night-lights. Keep items that you use often in easy-to-reach places. Lower the shelves around your home if needed. Move furniture so that there are clear paths around it. Do not use throw rugs or other things on the floor that can make you trip. If any of your floors are uneven, fix them. Add color or contrast paint or tape to clearly mark and help you see: Grab bars or handrails. First and last steps of staircases. Where the edge of each step is. If you use a ladder or stepladder: Make sure that it is fully opened. Do not climb a closed ladder. Make sure the sides of the ladder are locked in place. Have someone hold the ladder while you use it. Know where your pets are as you move through your home. What can I do in the bathroom?     Keep the floor dry. Clean up any water on the floor right away. Remove soap buildup in the bathtub or shower. Buildup makes bathtubs and showers slippery. Use non-skid mats or decals on the floor of the bathtub or shower. Attach bath mats securely with double-sided, non-slip rug tape. If you need to sit down in the shower, use a non-slip stool. Install grab bars by the toilet and in the bathtub and shower. Do not use towel bars as grab bars. What can I do in the bedroom? Make sure that you have a light by your bed that is easy to reach. Do not use any sheets or blankets on your bed that hang to the floor. Have a firm chair or bench with side arms that you can use for support when you get dressed. What can I do in the kitchen? Clean up any spills right away. If you need to reach something above you, use a step stool with a grab bar. Keep electrical cords out of the way. Do not use floor polish or wax that makes floors slippery. What can I do with my stairs? Do not leave anything on the  stairs. Make sure  that you have a light switch at the top and the bottom of the stairs. Make sure that there are handrails on both sides of the stairs. Fix handrails that are broken or loose. Install non-slip stair treads on all your stairs if they do not have carpet. Avoid having throw rugs at the top or bottom of the stairs. Choose a carpet that does not hide the edge of the steps on the stairs. Make sure that the carpet is firmly attached to the stairs. Fix carpet that is loose or worn. What can I do on the outside of my home? Use bright outdoor lighting. Fix the edges of walkways and driveways and fix any cracks. Clear paths of anything that can make you trip, such as tools or rocks. Add color or contrast paint or tape to clearly mark and help you see anything that might make you trip as you walk through a door, such as a raised step or threshold. Trim any bushes or trees on paths to your home. Check to see if handrails are loose or broken and that both sides of all steps have handrails. Install guardrails along the edges of any raised decks and porches. Have leaves, snow, or ice cleared regularly. Use sand, salt, or ice melter on paths if you live where there is ice and snow during the winter. Clean up any spills in your garage right away. This includes grease or oil spills. What other actions can I take? Review your medicines with your doctor. Some medicines can cause dizziness or changes in blood pressure, which increase your risk of falling. Wear shoes that: Have a low heel. Do not wear high heels. Have rubber bottoms and are closed at the toe. Feel good on your feet and fit well. Use tools that help you move around if needed. These include: Canes. Walkers. Scooters. Crutches. Ask your doctor what else you can do to help prevent falls. This may include seeing a physical therapist to learn to do exercises to move better and get stronger. Where to find more information Centers for  Disease Control and Prevention, STEAD[employer]: TonerPromos.no [employer] on Aging: BaseRingTones.pl National Institute on Aging: BaseRingTones.pl Contact a doctor if: You are afraid of falling at home. You feel weak, drowsy, or dizzy at home. You fall at home. Get help right away if you: Lose consciousness or have trouble moving after a fall. Have a fall that causes a head injury. These symptoms may be an emergency. Get help right away. Call 911. Do not wait to see if the symptoms will go away. Do not drive yourself to the hospital. This information is not intended to replace advice given to you by your health care provider. Make sure you discuss any questions you have with your health care provider. Document Revised: 04/15/2022 Document Reviewed: 04/15/2022 Elsevier Patient Education  2024 [employer].

## 2024-03-01 ENCOUNTER — Telehealth: Payer: Self-pay

## 2024-03-01 NOTE — Progress Notes (Signed)
 Complex Care Management Note Care Guide Note  03/01/2024 Name: Amy Leonard MRN: [id number] DOB: [birth year]   Complex Care Management Outreach Attempts: An unsuccessful telephone outreach was attempted today to offer the patient information about available complex care management services.  Follow Up Plan:  Additional outreach attempts will be made to offer the patient complex care management information and services.   Encounter Outcome:  No Answer  Inaki Vantine [facility]  [facility] Guide Direct Dial: [phone]  Fax: [phone] Website: [email or web address]

## 2024-03-02 ENCOUNTER — Telehealth: Payer: Self-pay

## 2024-03-02 NOTE — Progress Notes (Signed)
 Complex Care Management Note Care Guide Note  03/02/2024 Name: NARDA FUNDORA MRN: [id number] DOB: 1961/08/21   Complex Care Management Outreach Attempts: A second unsuccessful outreach was attempted today to offer the patient with information about available complex care management services.  Follow Up Plan:  Additional outreach attempts will be made to offer the patient complex care management information and services.   Encounter Outcome:  No Answer  Raymondo Garcialopez [facility]  [facility] Guide Direct Dial: [phone]  Fax: [phone] Website: [email or web address]

## 2024-03-05 ENCOUNTER — Telehealth: Payer: Self-pay

## 2024-03-05 NOTE — Progress Notes (Signed)
 Complex Care Management Note Care Guide Note  03/05/2024 Name: Amy Leonard MRN: [id number] DOB: [birth year]   Complex Care Management Outreach Attempts: A third unsuccessful outreach was attempted today to offer the patient with information about available complex care management services.  Follow Up Plan:  No further outreach attempts will be made at this time. We have been unable to contact the patient to offer or enroll patient in complex care management services.  Encounter Outcome:  No Answer  Joncarlo Friberg [facility]  [facility] Guide Direct Dial: [phone]  Fax: [phone] Website: [email or web address]

## 2024-03-11 ENCOUNTER — Other Ambulatory Visit: Payer: Self-pay

## 2024-03-11 DIAGNOSIS — I10 Essential (primary) hypertension: Secondary | ICD-10-CM

## 2024-03-11 NOTE — Patient Outreach (Signed)
 Complex Care Management   Visit Note  03/11/2024  Name:  Amy Leonard MRN: [id number] DOB: 1961-02-21  Situation: Referral received for Complex Care Management related to HTN I obtained verbal consent from Patient.  Visit completed with HTN  on the phone  Background:   Past Medical History:  Diagnosis Date   Anxiety    Anxiety and depression    Barrett's esophageal ulceration    Basal ganglia infarction (HCC) 04/17/2012   Breast cancer (HCC) 2014   BILATERAL LUMPECTOMY   Carpal tunnel syndrome    Chronic back pain    spinal stenosis   Depression    Elevated WBC count    nl diff   Erosive gastritis    Folliculitis 05/05/2011   GERD (gastroesophageal reflux disease)    Glaucoma    ?   History of chemotherapy 2014   BREAST CA   HLD (hyperlipidemia)    HSV infection    recurrent (side and buttox)   Hypertension    Migraine    Personal history of chemotherapy 2014   bilateral breast ca   Personal history of radiation therapy 2014   bilateral breast ca   Radiation 2014   BREAST CA   Tobacco abuse    Wears dentures    top    Assessment: Patient Reported Symptoms:  Cognitive Cognitive Status: No symptoms reported   Health Maintenance Behaviors: Annual physical exam, Exercise Healing Pattern: Average Health Facilitated by: Rest  Neurological Neurological Review of Symptoms: Dizziness, Headaches Neurological Comment: reports episode after working outside feeling shaky dizzy, ate, drank and rested with relief  HEENT HEENT Symptoms Reported: Nasal discharge HEENT Comment: seasonal allergies improved    Cardiovascular Cardiovascular Symptoms Reported: Fatigue Does patient have uncontrolled Hypertension?: No Cardiovascular Management Strategies: Medication therapy, Routine screening, Exercise, Adequate rest Cardiovascular Comment: works at [employer] - does significant amount of walking daily  Respiratory Respiratory Symptoms Reported: Dry  cough Additional Respiratory Details: still smoking about 1 pack/day - discussed reducing by 1 cigarette day/week, will consider patches again this week, also considering non nicotine  vape - advised against vape for safety Respiratory Management Strategies: Medication therapy  Endocrine Endocrine Symptoms Reported: No symptoms reported Is patient diabetic?: No Endocrine Comment: discussed diet exercise and hydration  Gastrointestinal Gastrointestinal Symptoms Reported: No symptoms reported      Genitourinary Genitourinary Symptoms Reported: Incontinence Genitourinary Management Strategies: Incontinence garment/pad  Integumentary Integumentary Symptoms Reported: No symptoms reported    Musculoskeletal Additional Musculoskeletal Details: right knee  pain swelling in knee and foot rates knee when climbing/bending 7/10 - needs surgery cane prn, pain left foot from callous 10/10 Musculoskeletal Management Strategies: Adequate rest, Exercise, Medication therapy, Routine screening Falls in the past year?: No Number of falls in past year: 1 or less Was there an injury with Fall?: No Fall Risk Category Calculator: 0 Patient Fall Risk Level: Low Fall Risk Patient at Risk for Falls Due to: Impaired balance/gait, Orthopedic patient Fall risk Follow up: Falls evaluation completed, Falls prevention discussed  Psychosocial Psychosocial Symptoms Reported: No symptoms reported Additional Psychological Details: concerns about grandaughter Behavioral Management Strategies: Adequate rest Major Change/Loss/Stressor/Fears (CP): Resources Techniques to Cope with Loss/Stress/Change: Diversional activities Quality of Family Relationships: supportive Do you feel physically threatened by others?: No      03/11/2024   10:39 AM  Depression screen PHQ 2/9  Decreased Interest 1  Down, Depressed, Hopeless 1  PHQ - 2 Score 2  Altered sleeping 1  Tired, decreased energy 1  Change in appetite 1  Feeling bad or  failure about yourself  0  Trouble concentrating 0  Moving slowly or fidgety/restless 0  Suicidal thoughts 0  PHQ-9 Score 5    There were no vitals filed for this visit.  Medications Reviewed Today     Reviewed by Devra Lands, RN (Registered Nurse) on 03/11/24 at 1021  Med List Status: <None>   Medication Order Taking? Sig Documenting Provider Last Dose Status Informant  albuterol  (VENTOLIN  HFA) 108 (90 Base) MCG/ACT inhaler [id number] Yes Inhale 2 puffs into the lungs every 4 (four) hours as needed for wheezing or shortness of breath. Tower, Laine LABOR, MD  Active   aspirin  81 MG chewable tablet [id number] Yes Chew 1 tablet (81 mg total) by mouth daily. Tower, Laine LABOR, MD  Active Self  atorvastatin  (LIPITOR) 10 MG tablet [id number] Yes TAKE 1 TABLET DAILY Tower, Laine LABOR, MD  Active   b complex vitamins tablet [id number] Yes Take 2 tablets by mouth daily. [provider]  Active Self  calcium  carbonate (TUMS - DOSED IN MG ELEMENTAL CALCIUM ) 500 MG chewable tablet [id number] Yes Chew by mouth as directed. [provider]  Active Self  Calcium  Carbonate-Vitamin D  (CALCIUM  + D PO) [id number]  Take 1 tablet by mouth daily.  Patient not taking: Reported on 02/26/2024   [provider]  Active Self           Med Note HARVEY, [place] S   Wed Jun 19, 2022  9:45 AM)    ipratropium (ATROVENT) 0.03 % nasal spray [id number]  Place 2 sprays into both nostrils every 12 (twelve) hours.  Patient not taking: Reported on 03/11/2024   [provider]  Active   losartan  (COZAAR ) 25 MG tablet [id number] Yes TAKE 1 TABLET DAILY Tower, Laine LABOR, MD  Active   meloxicam  (MOBIC ) 15 MG tablet [id number]  TAKE 1 TABLET DAILY.  Patient not taking: Reported on 02/26/2024   Watt Mirza, MD  Active   Multiple Vitamins-Minerals (MULTIVITAMIN WOMEN 50+ PO) [id number] Yes Take 1 tablet by mouth daily. [provider]  Active   nicotine  (NICODERM CQ  - DOSED IN MG/24 HOURS) 14 mg/24hr  patch [id number]  PLACE 1 PATCH ONTO THE SKIN DAILY.  Patient not taking: Reported on 12/30/2023   Randeen Laine LABOR, MD  Active   omeprazole  (PRILOSEC) 40 MG capsule [id number] Yes Take 1 capsule (40 mg total) by mouth daily. Aneita Gwendlyn DASEN, MD  Active Self  Probiotic Product (PROBIOTIC DAILY PO) [id number]  Take 2 tablets by mouth daily.  Patient not taking: Reported on 02/26/2024   [provider]  Active             Recommendation:   PCP Follow-up Continue Current Plan of Care  Follow Up Plan:   Telephone follow-up in 1 month  Lands Devra, MSN, RN [facility]  [facility] RN Care Manager Direct Dial: [phone] Fax: [phone]

## 2024-03-11 NOTE — Patient Instructions (Signed)
 Visit Information  Thank you for taking time to visit with me today. Please don't hesitate to contact me if I can be of assistance to you before our next scheduled appointment.  Your next care management appointment is by telephone on 04/08/2024 at 9:00 am  Telephone follow-up in 1 month  Please call the care guide team at [phone] if you need to cancel, schedule, or reschedule an appointment.   Please call the Suicide and Crisis Lifeline: 988 call the [place]  National Suicide Prevention Lifeline: 562-329-2211 or TTY: 364-228-0894 TTY 681 821 5493) to talk to a trained counselor call 1-800-273-TALK (toll free, 24 hour hotline) go to [facility] [address], [place] [phone]) call 911 if you are experiencing a Mental Health or Behavioral Health Crisis or need someone to talk to.  Nestora Duos, MSN, RN [facility], [facility] RN Care Manager Direct Dial: [phone] Fax: [phone]  Hypertension, Adult Hypertension is another name for high blood pressure. High blood pressure forces your heart to work harder to pump blood. This can cause problems over time. There are two numbers in a blood pressure reading. There is a top number (systolic) over a bottom number (diastolic). It is best to have a blood pressure that is below 120/80. What are the causes? The cause of this condition is not known. Some other conditions can lead to high blood pressure. What increases the risk? Some lifestyle factors can make you more likely to develop high blood pressure: Smoking. Not getting enough exercise or physical activity. Being overweight. Having too much fat, sugar, calories, or salt (sodium) in your diet. Drinking too much alcohol. Other risk factors include: Having any of these conditions: Heart disease. Diabetes. High cholesterol. Kidney disease. Obstructive sleep apnea. Having a family history of  high blood pressure and high cholesterol. Age. The risk increases with age. Stress. What are the signs or symptoms? High blood pressure may not cause symptoms. Very high blood pressure (hypertensive crisis) may cause: Headache. Fast or uneven heartbeats (palpitations). Shortness of breath. Nosebleed. Vomiting or feeling like you may vomit (nauseous). Changes in how you see. Very bad chest pain. Feeling dizzy. Seizures. How is this treated? This condition is treated by making healthy lifestyle changes, such as: Eating healthy foods. Exercising more. Drinking less alcohol. Your doctor may prescribe medicine if lifestyle changes do not help enough and if: Your top number is above 130. Your bottom number is above 80. Your personal target blood pressure may vary. Follow these instructions at home: Eating and drinking  If told, follow the DASH eating plan. To follow this plan: Fill one half of your plate at each meal with fruits and vegetables. Fill one fourth of your plate at each meal with whole grains. Whole grains include whole-wheat pasta, brown rice, and whole-grain bread. Eat or drink low-fat dairy products, such as skim milk or low-fat yogurt. Fill one fourth of your plate at each meal with low-fat (lean) proteins. Low-fat proteins include fish, chicken without skin, eggs, beans, and tofu. Avoid fatty meat, cured and processed meat, or chicken with skin. Avoid pre-made or processed food. Limit the amount of salt in your diet to less than 1,500 mg each day. Do not drink alcohol if: Your doctor tells you not to drink. You are pregnant, may be pregnant, or are planning to become pregnant. If you drink alcohol: Limit how much you have to: 0-1 drink a day for women. 0-2 drinks a day for men. Know  how much alcohol is in your drink. In the [place] one drink equals one 12 oz bottle of beer (355 mL), one 5 oz glass of wine (148 mL), or one 1 oz glass of hard liquor (44  mL). Lifestyle  Work with your doctor to stay at a healthy weight or to lose weight. Ask your doctor what the best weight is for you. Get at least 30 minutes of exercise that causes your heart to beat faster (aerobic exercise) most days of the week. This may include walking, swimming, or biking. Get at least 30 minutes of exercise that strengthens your muscles (resistance exercise) at least 3 days a week. This may include lifting weights or doing Pilates. Do not smoke or use any products that contain nicotine  or tobacco. If you need help quitting, ask your doctor. Check your blood pressure at home as told by your doctor. Keep all follow-up visits. Medicines Take over-the-counter and prescription medicines only as told by your doctor. Follow directions carefully. Do not skip doses of blood pressure medicine. The medicine does not work as well if you skip doses. Skipping doses also puts you at risk for problems. Ask your doctor about side effects or reactions to medicines that you should watch for. Contact a doctor if: You think you are having a reaction to the medicine you are taking. You have headaches that keep coming back. You feel dizzy. You have swelling in your ankles. You have trouble with your vision. Get help right away if: You get a very bad headache. You start to feel mixed up (confused). You feel weak or numb. You feel faint. You have very bad pain in your: Chest. Belly (abdomen). You vomit more than once. You have trouble breathing. These symptoms may be an emergency. Get help right away. Call 911. Do not wait to see if the symptoms will go away. Do not drive yourself to the hospital. Summary Hypertension is another name for high blood pressure. High blood pressure forces your heart to work harder to pump blood. For most people, a normal blood pressure is less than 120/80. Making healthy choices can help lower blood pressure. If your blood pressure does not get lower  with healthy choices, you may need to take medicine. This information is not intended to replace advice given to you by your health care provider. Make sure you discuss any questions you have with your health care provider. Document Revised: 05/31/2021 Document Reviewed: 05/31/2021 Elsevier Patient Education  2024 Elsevier Inc.   Heart Attack A heart attack happens when the flow of blood and oxygen to the heart is cut off. If not treated right away, a heart attack can cause lasting damage to the heart. A heart attack is also called a myocardial infarction. If you think you're having a heart attack, don't wait to see if the symptoms will go away. Get medical help right away. What are the causes? A heart attack may be caused by: A fatty substance called plaque. This can build up in blood vessels called arteries. It can block the flow of blood to the heart. A blood clot in the blood vessels that go to the heart. An abnormal heartbeat. Some diseases that lower the amount of oxygen in the blood. These include anemia and emphysema. Tightening of a blood vessel that cuts off blood to the heart. This is called a spasm. Other causes may include: Using drugs such as cocaine or methamphetamine. A tear in a blood vessel of the heart. What  increases the risk? Aging. The risk gets higher as you get older. Having a family history of or any past problems with: Chest pain. Heart attack or stroke. Peripheral vascular disease. This is when the blood vessels in the legs, arms, or stomach get narrow. Taking chemotherapy or medicines to treat cancer. Being female. Being overweight or obese. Having any of these conditions: High blood pressure. High cholesterol. Diabetes. Doing any of these things: Drinking too much alcohol. Smoking. Not getting enough exercise. What are the signs or symptoms? Symptoms of a heart attack can vary, depending on things like your gender or age. Symptoms may include: Chest  pain. It may feel like: Crushing or squeezing. Tightness, pressure, fullness, or heaviness. Pain in the arm, neck, jaw, back, or upper body. Heartburn or upset stomach. Shortness of breath. Feeling like you may vomit. Cold sweats. Feeling light-headed or dizzy all of a sudden. Or you may pass out. How is this diagnosed? A heart attack may be diagnosed with: Imaging tests, such as: Electrocardiogram, or ECG. This records the electrical activity of your heart. CT scan. Coronary angiogram. For this test, dye is put into the body and X-rays are taken of the heart arteries. Echocardiogram. This test uses sound waves to make pictures of the heart. Blood tests. These are done to check for chemicals that are released by a damaged heart. How is this treated? A heart attack must be treated as soon as possible. Treatment may include: Medicines to: Break up or dissolve blood clots. Help prevent blood clots. Treat blood pressure. Improve blood flow to the heart. Help with pain. Angioplasty and stent placement. These are procedures to widen a blocked artery and keep it open. Open heart surgery, called coronary artery bypass grafting. This enables blood to flow to the heart by going around the blocked part of the artery. Follow these instructions at home: Medicines Take your medicines only as told by your health care provider. Do not take these medicines unless your provider says it's okay: NSAIDs, such as ibuprofen or naproxen. Any vitamins or supplements. Hormone replacement therapy. If you're taking blood thinners: Talk with your provider before taking aspirin  or NSAIDs. Take your medicines as told. Take them at the same time each day. Do not do things that could hurt or bruise you. Be careful to avoid falls. Wear an alert bracelet or carry a card that says you take blood thinners. Lifestyle  Do not smoke, vape, or use products with nicotine  or tobacco in them. If you need help quitting,  talk with your provider. Avoid secondhand smoke. Exercise often. Ask your provider about a cardiac rehab program. This program helps you to: Exercise safely. Learn ways to improve heart health and well-being as you recover. Eat foods that are healthy for your heart. Your provider will tell you what foods to eat. Stay at a healthy weight. Learn ways to lower your stress level. Do not use illegal drugs. Alcohol use Do not drink alcohol if: Your provider tells you not to drink. You're pregnant, may be pregnant, or plan to become pregnant. If you drink alcohol: Limit how much you have to: 0-1 drink a day if you're female. 0-2 drinks a day if you're female. Know how much alcohol is in your drink. In the [place] one drink is one 12 oz bottle of beer (355 mL), one 5 oz glass of wine (148 mL), or one 1 oz glass of hard liquor (44 mL). General instructions Work with your provider to treat other  problems you have, such as high blood pressure or diabetes. Get screened for depression. Get treatment if needed. Keep your vaccines up to date. Get the flu shot every year. Keep all follow-up visits. Your provider will check that your heart health is improving with treatments. Contact a health care provider if: You feel very sad. You have trouble doing your daily activities. You get light-headed or dizzy. You have blood pressure that is higher or lower than what your provider told you it should be. Get help right away if: You have sudden, unexplained discomfort in your chest, arms, back, neck, jaw, or upper body. You have sudden shortness of breath. You have sudden sweating or your skin is cold to the touch. You feel like you may vomit or you vomit. You feel your heart beating fast or skipping beats. These symptoms may be an emergency. Call 911 right away. Do not wait to see if the symptoms will go away. Do not drive yourself to the hospital. This information is not intended to replace advice given  to you by your health care provider. Make sure you discuss any questions you have with your health care provider. Document Revised: 05/15/2023 Document Reviewed: 10/30/2022 Elsevier Patient Education  2024 Elsevier Inc.  Warning Signs of a Stroke A stroke is a Midwife. It should be treated right away. A stroke happens when there is not enough blood flow to the brain. A stroke can lead to brain damage and death. But if a person gets treated right away, they have a better chance of surviving and recovering. It is very important to recognize the symptoms of a stroke. What types of strokes are there? There are two main types of strokes: Ischemic stroke. This is the most common type. It happens when a blood vessel that sends blood to the brain is blocked. Hemorrhagic stroke. This happens when there is bleeding in the brain. This may be from a blood vessel leaking or bursting. A transient ischemic attack (TIA) causes the same symptoms as a stroke. But the symptoms go away quickly and do not cause lasting damage to the brain. TIAs still need to be treated right away. They are also a sign that you are at higher risk for a stroke. What are the warning signs of a stroke? The symptoms of a stroke may differ based on the part of the brain that is involved. Symptoms often happen all of a sudden. BE FAST symptoms BE FAST is an easy way to remember the main warning signs of a stroke: B - Balance. Signs are dizziness, sudden trouble walking, or loss of balance. E - Eyes. Signs are trouble seeing or a sudden change in vision. F - Face. Signs are sudden weakness or numbness of the face, or the face or eyelid drooping on one side. A - Arms. Signs are weakness or numbness in an arm. This happens suddenly and usually on one side of the body. S - Speech. Signs are sudden trouble speaking, slurred speech, or trouble understanding what people say. T - Time. Time to call emergency services. Write down  what time symptoms started. A stroke may be happening even if only one BE FAST symptom is present. Other signs of a stroke Some less common signs of a stroke include: A sudden, severe headache with no known cause. Nausea or vomiting. Seizure. These symptoms may be an emergency. Get help right away. Call 911. Do not wait to see if the symptoms will go away. Do not  drive yourself to the hospital.  This information is not intended to replace advice given to you by your health care provider. Make sure you discuss any questions you have with your health care provider. Document Revised: 05/27/2022 Document Reviewed: 05/27/2022 Elsevier Patient Education  2024 [employer].

## 2024-04-02 ENCOUNTER — Telehealth: Payer: Self-pay

## 2024-04-02 NOTE — Progress Notes (Signed)
 Complex Care Management Note Care Guide Note  04/02/2024 Name: Amy Leonard MRN: [id number] DOB: [birth year]   Complex Care Management Outreach Attempts: An unsuccessful telephone outreach was attempted today to offer the patient information about available complex care management services.  Follow Up Plan:  Additional outreach attempts will be made to offer the patient complex care management information and services.   Encounter Outcome:  No Answer  Dreama [facility]  [facility], [facility], [place] [facility] Care Management Assistant Direct Dial: [phone]  Fax: [phone]

## 2024-04-06 ENCOUNTER — Telehealth: Payer: Self-pay

## 2024-04-06 NOTE — Progress Notes (Signed)
 Complex Care Management Care Guide Note  04/06/2024 Name: Amy Leonard MRN: [id number] DOB: [birth year]  Amy Leonard is a 63 y.o. year old female who is a primary care patient of Tower, Laine LABOR, MD and is actively engaged with the care management team. I reached out to Amy Leonard by phone today to assist with re-scheduling  with the RN Case Manager.  Follow up plan: Telephone appointment with complex care management team member scheduled for:  04/09/24 at 9:00 a.m.   Dreama [facility]  [facility], [facility] Care Management Assistant Direct Dial: [phone]  Fax: [phone]

## 2024-04-08 ENCOUNTER — Telehealth

## 2024-04-09 ENCOUNTER — Other Ambulatory Visit: Payer: Self-pay

## 2024-04-09 NOTE — Patient Outreach (Signed)
 Complex Care Management   Visit Note  04/09/2024  Name:  Amy Leonard MRN: [id number] DOB: [birth year]  Situation: Referral received for Complex Care Management related to HTN I obtained verbal consent from Patient.  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   History of chemotherapy 2014   BREAST CA   HLD (hyperlipidemia)    HSV infection    recurrent (side and buttox)   Hypertension    Migraine    Personal history of chemotherapy 2014   bilateral breast ca   Personal history of radiation therapy 2014   bilateral breast ca   Radiation 2014   BREAST CA   Tobacco abuse    Wears dentures    top    Assessment: Patient Reported Symptoms:  Cognitive Cognitive Status: Alert and oriented to person, place, and time, Insightful and able to interpret abstract concepts, Normal speech and language skills Cognitive/Intellectual Conditions Management [RPT]: None reported or documented in medical history or problem list   Health Maintenance Behaviors: Annual physical exam, Exercise Healing Pattern: Average Health Facilitated by: Rest  Neurological Neurological Review of Symptoms: Dizziness Neurological Management Strategies: Routine screening Neurological Comment: if gets up too fast soe dizziness - working to go slower  HEENT HEENT Symptoms Reported: Nasal discharge HEENT Comment: pollen allergies    Cardiovascular Cardiovascular Symptoms Reported: No symptoms reported Does patient have uncontrolled Hypertension?: No Cardiovascular  Management Strategies: Medication therapy, Routine screening, Exercise, Adequate rest Cardiovascular Comment: will try to start checking BP 3x week same time daily and record  Respiratory Respiratory Symptoms Reported: Dry cough Other Respiratory Symptoms: seasonl and pet allergies Additional Respiratory Details: cigarettes - pack a day, interested in patches, I need to force myself to start Respiratory Management Strategies: Routine screening  Endocrine Endocrine Symptoms Reported: No symptoms reported Is patient diabetic?: No    Gastrointestinal Gastrointestinal Symptoms Reported: No symptoms reported      Genitourinary Genitourinary Symptoms Reported: Incontinence Genitourinary Management Strategies: Incontinence garment/pad  Integumentary Integumentary Symptoms Reported: No symptoms reported    Musculoskeletal Additional Musculoskeletal Details: ice for right knee and foot Musculoskeletal Management Strategies: Adequate rest, Exercise, Medication therapy, Routine screening Falls in the past year?: No Number of falls in past year: 1 or less Was there an injury with Fall?: No Fall Risk Category Calculator: 0 Patient Fall Risk Level: Low Fall Risk Patient at Risk for Falls Due to: Impaired balance/gait, Orthopedic patient Fall risk Follow up: Falls evaluation completed, Falls prevention discussed  Psychosocial Psychosocial Symptoms Reported: No symptoms reported Additional Psychological Details: job stress, considering new job - financial stress gone     Quality of Family Relationships: supportive Do you feel physically threatened by others?: No      04/09/2024    9:56 AM  Depression screen PHQ 2/9  Decreased Interest 2  Down, Depressed, Hopeless 0  PHQ - 2 Score 2  Altered sleeping 1  Tired, decreased energy 1  Change  in appetite 1  Feeling bad or failure about yourself  0  Trouble concentrating 0  Moving slowly or fidgety/restless 0  Suicidal thoughts 0  PHQ-9 Score  5  Difficult doing work/chores Not difficult at all    There were no vitals filed for this visit.  Medications Reviewed Today     Reviewed by Devra Lands, RN (Registered Nurse) on 04/09/24 at [phone]  Med List Status: <None>   Medication Order Taking? Sig Documenting Provider Last Dose Status Informant  albuterol  (VENTOLIN  HFA) 108 (90 Base) MCG/ACT inhaler [id number] Yes Inhale 2 puffs into the lungs every 4 (four) hours as needed for wheezing or shortness of breath. Tower, Laine LABOR, MD  Active   aspirin  81 MG chewable tablet [id number] Yes Chew 1 tablet (81 mg total) by mouth daily. Tower, Laine LABOR, MD  Active Self  atorvastatin  (LIPITOR) 10 MG tablet [id number] Yes TAKE 1 TABLET DAILY Tower, Laine LABOR, MD  Active   b complex vitamins tablet [id number] Yes Take 2 tablets by mouth daily.  Patient taking differently: Take 2 tablets by mouth daily. Patient reports taking 1 tab daily   [provider]  Active Self  calcium  carbonate (TUMS - DOSED IN MG ELEMENTAL CALCIUM ) 500 MG chewable tablet [id number] Yes Chew by mouth as directed. [provider]  Active Self  Calcium  Carbonate-Vitamin D  (CALCIUM  + D PO) [id number]  Take 1 tablet by mouth daily.  Patient not taking: Reported on 02/26/2024   [provider]  Active Self           Med Note HARVEY, [place] S   Wed Jun 19, 2022  9:45 AM)    ipratropium (ATROVENT) 0.03 % nasal spray [id number]  Place 2 sprays into both nostrils every 12 (twelve) hours.  Patient not taking: Reported on 03/11/2024   [provider]  Active   losartan  (COZAAR ) 25 MG tablet [id number] Yes TAKE 1 TABLET DAILY Tower, Laine LABOR, MD  Active   meloxicam  (MOBIC ) 15 MG tablet [id number]  TAKE 1 TABLET DAILY.  Patient not taking: Reported on 02/26/2024   Watt Mirza, MD  Active   Multiple Vitamins-Minerals (MULTIVITAMIN WOMEN 50+ PO) [id number]  Take 1 tablet by mouth daily.  Patient not taking: Reported on 04/09/2024   [provider]   Active   nicotine  (NICODERM CQ  - DOSED IN MG/24 HOURS) 14 mg/24hr patch [id number]  PLACE 1 PATCH ONTO THE SKIN DAILY.  Patient not taking: Reported on 12/30/2023   Randeen Laine LABOR, MD  Active   omeprazole  (PRILOSEC) 40 MG capsule [id number] Yes Take 1 capsule (40 mg total) by mouth daily. Aneita Gwendlyn DASEN, MD  Active Self  Probiotic Product (PROBIOTIC DAILY PO) [id number]  Take 2 tablets by mouth daily.  Patient not taking: Reported on 02/26/2024   [provider]  Active             Recommendation:   PCP Follow-up Specialty provider follow-up Eye doctor and dentist Continue Current Plan of Care  Follow Up Plan:   Telephone follow-up in 1 month  Lands Devra, MSN, RN [facility]  [facility], [facility] RN Care Manager Direct Dial: [phone] Fax: [phone]

## 2024-04-09 NOTE — Patient Instructions (Signed)
 Visit Information  Thank you for taking time to visit with me today. Please don't hesitate to contact me if I can be of assistance to you before our next scheduled appointment.  Your next care management appointment is by telephone on 05/07/2024 at 9:00 am  Telephone follow-up in 1 month  Please call the care guide team at [phone] if you need to cancel, schedule, or reschedule an appointment.   Please call the Suicide and Crisis Lifeline: 988 call the [place]  National Suicide Prevention Lifeline: 336-071-0108 or TTY: (984)399-0586 TTY (810)496-8525) to talk to a trained counselor call 1-800-273-TALK (toll free, 24 hour hotline) go to [facility] [address] [place] [phone]) call 911 if you are experiencing a Mental Health or Behavioral Health Crisis or need someone to talk to.  Nestora Duos, MSN, RN [place]  [facility], [facility] RN Care Manager Direct Dial: [phone] Fax: [phone]  Managing the Challenge of Quitting Smoking Quitting smoking is a physical and mental challenge. You may have cravings, withdrawal symptoms, and temptation to smoke. Before quitting, work with your health care provider to make a plan that can help you manage quitting. Making a plan before you quit may keep you from smoking when you have the urge to smoke while trying to quit. How to manage lifestyle changes Managing stress Stress can make you want to smoke, and wanting to smoke may cause stress. It is important to find ways to manage your stress. You could try some of the following: Practice relaxation techniques. Breathe slowly and deeply, in through your nose and out through your mouth. Listen to music. Soak in a bath or take a shower. Imagine a peaceful place or vacation. Get some support. Talk with family or friends about your stress. Join a support group. Talk with a counselor or therapist. Get some physical  activity. Go for a walk, run, or bike ride. Play a favorite sport. Practice yoga.  Medicines Talk with your health care provider about medicines that might help you deal with cravings and make quitting easier for you. Relationships Social situations can be difficult when you are quitting smoking. To manage this, you can: Avoid parties and other social situations where people might be smoking. Avoid alcohol. Leave right away if you have the urge to smoke. Explain to your family and friends that you are quitting smoking. Ask for support and let them know you might be a bit grumpy. Plan activities where smoking is not an option. General instructions Be aware that many people gain weight after they quit smoking. However, not everyone does. To keep from gaining weight, have a plan in place before you quit, and stick to the plan after you quit. Your plan should include: Eating healthy snacks. When you have a craving, it may help to: Eat popcorn, or try carrots, celery, or other cut vegetables. Chew sugar-free gum. Changing how you eat. Eat small portion sizes at meals. Eat 4-6 small meals throughout the day instead of 1-2 large meals a day. Be mindful when you eat. You should avoid watching television or doing other things that might distract you as you eat. Exercising regularly. Make time to exercise each day. If you do not have time for a long workout, do short bouts of exercise for 5-10 minutes several times a day. Do some form of strengthening exercise, such as weight lifting. Do some exercise that gets your heart beating and causes you to breathe deeply, such as walking fast,  running, swimming, or biking. This is very important. Drinking plenty of water or other low-calorie or no-calorie drinks. Drink enough fluid to keep your urine pale yellow.  How to recognize withdrawal symptoms Your body and mind may experience discomfort as you try to get used to not having nicotine  in your system.  These effects are called withdrawal symptoms. They may include: Feeling hungrier than normal. Having trouble concentrating. Feeling irritable or restless. Having trouble sleeping. Feeling depressed. Craving a cigarette. These symptoms may surprise you, but they are normal to have when quitting smoking. To manage withdrawal symptoms: Avoid places, people, and activities that trigger your cravings. Remember why you want to quit. Get plenty of sleep. Avoid coffee and other drinks that contain caffeine. These may worsen some of your symptoms. How to manage cravings Come up with a plan for how to deal with your cravings. The plan should include the following: A definition of the specific situation you want to deal with. An activity or action you will take to replace smoking. A clear idea for how this action will help. The name of someone who could help you with this. Cravings usually last for 5-10 minutes. Consider taking the following actions to help you with your plan to deal with cravings: Keep your mouth busy. Chew sugar-free gum. Suck on hard candies or a straw. Brush your teeth. Keep your hands and body busy. Change to a different activity right away. Squeeze or play with a ball. Do an activity or a hobby, such as making bead jewelry, practicing needlepoint, or working with wood. Mix up your normal routine. Take a short exercise break. Go for a quick walk, or run up and down stairs. Focus on doing something kind or helpful for someone else. Call a friend or family member to talk during a craving. Join a support group. Contact a quitline. Where to find support To get help or find a support group: Call the National Cancer Institute's Smoking Quitline: 1-800-QUIT-NOW 682-803-2930) Text QUIT to SmokefreeTXT: 521151 Where to find more information Visit these websites to find more information on quitting smoking: U.S. Department of Health and Human Services:  www.smokefree.gov American Lung Association: www.freedomfromsmoking.org Centers for Disease Control and Prevention (CDC): FootballExhibition.com.br American Heart Association: www.heart.org Contact a health care provider if: You want to change your plan for quitting. The medicines you are taking are not helping. Your eating feels out of control or you cannot sleep. You feel depressed or become very anxious. Summary Quitting smoking is a physical and mental challenge. You will face cravings, withdrawal symptoms, and temptation to smoke again. Preparation can help you as you go through these challenges. Try different techniques to manage stress, handle social situations, and prevent weight gain. You can deal with cravings by keeping your mouth busy (such as by chewing gum), keeping your hands and body busy, calling family or friends, or contacting a quitline for people who want to quit smoking. You can deal with withdrawal symptoms by avoiding places where people smoke, getting plenty of rest, and avoiding drinks that contain caffeine. This information is not intended to replace advice given to you by your health care provider. Make sure you discuss any questions you have with your health care provider. Document Revised: 08/03/2021 Document Reviewed: 08/03/2021 Elsevier Patient Education  2024 Elsevier Inc.   Hypertension, Adult Hypertension is another name for high blood pressure. High blood pressure forces your heart to work harder to pump blood. This can cause problems over time. There are two  numbers in a blood pressure reading. There is a top number (systolic) over a bottom number (diastolic). It is best to have a blood pressure that is below 120/80. What are the causes? The cause of this condition is not known. Some other conditions can lead to high blood pressure. What increases the risk? Some lifestyle factors can make you more likely to develop high blood pressure: Smoking. Not getting enough  exercise or physical activity. Being overweight. Having too much fat, sugar, calories, or salt (sodium) in your diet. Drinking too much alcohol. Other risk factors include: Having any of these conditions: Heart disease. Diabetes. High cholesterol. Kidney disease. Obstructive sleep apnea. Having a family history of high blood pressure and high cholesterol. Age. The risk increases with age. Stress. What are the signs or symptoms? High blood pressure may not cause symptoms. Very high blood pressure (hypertensive crisis) may cause: Headache. Fast or uneven heartbeats (palpitations). Shortness of breath. Nosebleed. Vomiting or feeling like you may vomit (nauseous). Changes in how you see. Very bad chest pain. Feeling dizzy. Seizures. How is this treated? This condition is treated by making healthy lifestyle changes, such as: Eating healthy foods. Exercising more. Drinking less alcohol. Your doctor may prescribe medicine if lifestyle changes do not help enough and if: Your top number is above 130. Your bottom number is above 80. Your personal target blood pressure may vary. Follow these instructions at home: Eating and drinking  If told, follow the DASH eating plan. To follow this plan: Fill one half of your plate at each meal with fruits and vegetables. Fill one fourth of your plate at each meal with whole grains. Whole grains include whole-wheat pasta, brown rice, and whole-grain bread. Eat or drink low-fat dairy products, such as skim milk or low-fat yogurt. Fill one fourth of your plate at each meal with low-fat (lean) proteins. Low-fat proteins include fish, chicken without skin, eggs, beans, and tofu. Avoid fatty meat, cured and processed meat, or chicken with skin. Avoid pre-made or processed food. Limit the amount of salt in your diet to less than 1,500 mg each day. Do not drink alcohol if: Your doctor tells you not to drink. You are pregnant, may be pregnant, or are  planning to become pregnant. If you drink alcohol: Limit how much you have to: 0-1 drink a day for women. 0-2 drinks a day for men. Know how much alcohol is in your drink. In the [place] one drink equals one 12 oz bottle of beer (355 mL), one 5 oz glass of wine (148 mL), or one 1 oz glass of hard liquor (44 mL). Lifestyle  Work with your doctor to stay at a healthy weight or to lose weight. Ask your doctor what the best weight is for you. Get at least 30 minutes of exercise that causes your heart to beat faster (aerobic exercise) most days of the week. This may include walking, swimming, or biking. Get at least 30 minutes of exercise that strengthens your muscles (resistance exercise) at least 3 days a week. This may include lifting weights or doing Pilates. Do not smoke or use any products that contain nicotine  or tobacco. If you need help quitting, ask your doctor. Check your blood pressure at home as told by your doctor. Keep all follow-up visits. Medicines Take over-the-counter and prescription medicines only as told by your doctor. Follow directions carefully. Do not skip doses of blood pressure medicine. The medicine does not work as well if you skip doses. Skipping doses  also puts you at risk for problems. Ask your doctor about side effects or reactions to medicines that you should watch for. Contact a doctor if: You think you are having a reaction to the medicine you are taking. You have headaches that keep coming back. You feel dizzy. You have swelling in your ankles. You have trouble with your vision. Get help right away if: You get a very bad headache. You start to feel mixed up (confused). You feel weak or numb. You feel faint. You have very bad pain in your: Chest. Belly (abdomen). You vomit more than once. You have trouble breathing. These symptoms may be an emergency. Get help right away. Call 911. Do not wait to see if the symptoms will go away. Do not drive  yourself to the hospital. Summary Hypertension is another name for high blood pressure. High blood pressure forces your heart to work harder to pump blood. For most people, a normal blood pressure is less than 120/80. Making healthy choices can help lower blood pressure. If your blood pressure does not get lower with healthy choices, you may need to take medicine. This information is not intended to replace advice given to you by your health care provider. Make sure you discuss any questions you have with your health care provider. Document Revised: 05/31/2021 Document Reviewed: 05/31/2021 Elsevier Patient Education  2024 [employer].

## 2024-04-09 NOTE — Progress Notes (Signed)
 Complex Care Management Note Care Guide Note  04/09/2024 Name: Amy Leonard MRN: [id number] DOB: 04-Sep-1960   Complex Care Management Outreach Attempts: A second unsuccessful outreach was attempted today to offer the patient with information about available complex care management services.  Follow Up Plan:  Additional outreach attempts will be made to offer the patient complex care management information and services.   Encounter Outcome:  No Answer  Dreama [facility]wood [facility]  [facility], [facility] Care Management Assistant Direct Dial: [phone]  Fax: [phone]

## 2024-04-13 NOTE — Progress Notes (Signed)
 Complex Care Management Note Care Guide Note  04/13/2024 Name: Amy Leonard MRN: [id number] DOB: 11-13-1960   Complex Care Management Outreach Attempts: A third unsuccessful outreach was attempted today to offer the patient with information about available complex care management services.  Follow Up Plan:  No further outreach attempts will be made at this time. We have been unable to contact the patient to offer or enroll patient in complex care management services.  Encounter Outcome:  No Answer  Dreama Lynwood [facility]  [facility], [facility] Assistant Direct Dial: [phone]  Fax: [phone]

## 2024-05-07 ENCOUNTER — Other Ambulatory Visit: Payer: Self-pay

## 2024-05-07 NOTE — Patient Instructions (Signed)
 Visit Information  Thank you for taking time to visit with me today. Please don't hesitate to contact me if I can be of assistance to you before our next scheduled appointment.  Your next care management appointment is by telephone on 06/04/2024 at 9:00 am  Telephone follow-up in 1 month  Please call the care guide team at [phone] if you need to cancel, schedule, or reschedule an appointment.   Please call the Suicide and Crisis Lifeline: 988 call the [place]  National Suicide Prevention Lifeline: 2517861785 or TTY: 657-117-3877 TTY 567 522 1102) to talk to a trained counselor call 1-800-273-TALK (toll free, 24 hour hotline) go to [facility] [address], [place] [phone]) call 911 if you are experiencing a Mental Health or Behavioral Health Crisis or need someone to talk to.  Nestora Duos, MSN, RN [facility], [facility] RN Care Manager Direct Dial: [phone] Fax: [phone]

## 2024-05-07 NOTE — Patient Outreach (Signed)
 Complex Care Management   Visit Note  05/07/2024  Name:  Amy Leonard MRN: [id number] DOB: 15-Dec-1960  Situation: Referral received for Complex Care Management related to HTN I obtained verbal consent from Patient.  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   History of chemotherapy 2014   BREAST CA   HLD (hyperlipidemia)    HSV infection    recurrent (side and buttox)   Hypertension    Migraine    Personal history of chemotherapy 2014   bilateral breast ca   Personal history of radiation therapy 2014   bilateral breast ca   Radiation 2014   BREAST CA   Tobacco abuse    Wears dentures    top    Assessment: Patient Reported Symptoms:  Cognitive Cognitive Status: Alert and oriented to person, place, and time, Normal speech and language skills, Insightful and able to interpret abstract concepts Cognitive/Intellectual Conditions Management [RPT]: None reported or documented in medical history or problem list   Health Maintenance Behaviors: Annual physical exam, Exercise  Neurological Neurological Review of Symptoms: Headaches Neurological Comment: miagraines at times upon waking, nausea aura, rare vomiting relieved with tylenol , discussed keeping migraine diary  HEENT HEENT Symptoms Reported: No symptoms reported      Cardiovascular Does patient have uncontrolled Hypertension?: No Cardiovascular Management Strategies: Medication therapy, Routine screening Cardiovascular Comment: needs to find BP cuff - not checking, discussed  importance, discussed issue finishing what she starts, advised making list to prioritize, states always gets bronchitis in fall, discussed sx to call PCP  Respiratory Respiratory Symptoms Reported: Dry cough Other Respiratory Symptoms: seasonal allergies Additional Respiratory Details: smoking a pack a day- has not started nicotine  patch, maybe this weekend Respiratory Management Strategies: Routine screening  Endocrine Endocrine Symptoms Reported: Not assessed Is patient diabetic?: No    Gastrointestinal Gastrointestinal Symptoms Reported: No symptoms reported      Genitourinary Genitourinary Symptoms Reported: Incontinence Genitourinary Management Strategies: Incontinence garment/pad  Integumentary Integumentary Symptoms Reported: No symptoms reported Skin Management Strategies: Routine screening  Musculoskeletal Musculoskelatal Symptoms Reviewed: Joint pain, Muscle pain, Difficulty walking Additional Musculoskeletal Details: right knee pain difficulty bending, at times 10/10, usu 5-6/10- left foot, constant pain when walking 10/10, ice and elevation, making appointment for ortho Musculoskeletal Management Strategies: Medication therapy, Routine screening, Adequate rest Falls in the past year?: No Number of falls in past year: 1 or less Was there an injury with Fall?: No Fall Risk Category Calculator: 0 Patient Fall Risk Level: Low Fall Risk Patient at Risk for Falls Due to: Impaired balance/gait, Orthopedic patient Fall risk Follow up: Falls evaluation completed, Falls prevention discussed, Education provided  Psychosocial Psychosocial Symptoms Reported: No symptoms reported Additional Psychological Details: job stress continues but improved     Do you feel physically threatened by others?: No    05/07/2024    PHQ2-9 Depression Screening   Little interest or pleasure in doing things Several days  Feeling down,  depressed, or hopeless Not at all  PHQ-2 - Total Score 1  Trouble  falling or staying asleep, or sleeping too much    Feeling tired or having little energy    Poor appetite or overeating     Feeling bad about yourself - or that you are a failure or have let yourself or your family down    Trouble concentrating on things, such as reading the newspaper or watching television    Moving or speaking so slowly that other people could have noticed.  Or the opposite - being so fidgety or restless that you have been moving around a lot more than usual    Thoughts that you would be better off dead, or hurting yourself in some way    PHQ2-9 Total Score    If you checked off any problems, how difficult have these problems made it for you to do your work, take care of things at home, or get along with other people    Depression Interventions/Treatment      There were no vitals filed for this visit.  Medications Reviewed Today     Reviewed by Devra Lands, RN (Registered Nurse) on 05/07/24 at 0908  Med List Status: <None>   Medication Order Taking? Sig Documenting Provider Last Dose Status Informant  albuterol  (VENTOLIN  HFA) 108 (90 Base) MCG/ACT inhaler [id number] Yes Inhale 2 puffs into the lungs every 4 (four) hours as needed for wheezing or shortness of breath. Tower, Laine LABOR, MD  Active   aspirin  81 MG chewable tablet [id number] Yes Chew 1 tablet (81 mg total) by mouth daily. Tower, Laine LABOR, MD  Active Self  atorvastatin  (LIPITOR) 10 MG tablet [id number] Yes TAKE 1 TABLET DAILY Tower, Laine LABOR, MD  Active   b complex vitamins tablet [id number] Yes Take 2 tablets by mouth daily.  Patient taking differently: Take 2 tablets by mouth daily. Patient reports taking 1 tab daily   [provider]  Active Self  calcium  carbonate (TUMS - DOSED IN MG ELEMENTAL CALCIUM ) 500 MG chewable tablet [id number]  Chew by mouth as directed.  Patient not taking: Reported on 05/07/2024   [provider]  Active Self  Calcium  Carbonate-Vitamin D  (CALCIUM  + D PO) [id number]   Take 1 tablet by mouth daily.  Patient not taking: Reported on 02/26/2024   [provider]  Active Self           Med Note HARVEY, [place] S   Wed Jun 19, 2022  9:45 AM)    ipratropium (ATROVENT) 0.03 % nasal spray [id number]  Place 2 sprays into both nostrils every 12 (twelve) hours.  Patient not taking: Reported on 03/11/2024   [provider]  Active   losartan  (COZAAR ) 25 MG tablet [id number] Yes TAKE 1 TABLET DAILY Tower, Laine LABOR, MD  Active   meloxicam  (MOBIC ) 15 MG tablet [id number]  TAKE 1 TABLET DAILY.  Patient not taking: Reported on 02/26/2024   Watt Mirza, MD  Active   Multiple Vitamins-Minerals (MULTIVITAMIN WOMEN 50+ PO) [id number]  Take 1 tablet by mouth daily.  Patient not taking: Reported on 04/09/2024   [provider]  Active   nicotine  (NICODERM CQ  - DOSED IN MG/24 HOURS) 14 mg/24hr patch [id number]  PLACE 1 PATCH ONTO THE SKIN DAILY.  Patient not taking: Reported on 12/30/2023   Randeen Laine LABOR, MD  Active   omeprazole  (PRILOSEC) 40 MG capsule [id number] Yes Take 1 capsule (40 mg total) by mouth daily. Aneita Gwendlyn DASEN,  MD  Active Self  Probiotic Product (PROBIOTIC DAILY PO) [id number]  Take 2 tablets by mouth daily.  Patient not taking: Reported on 02/26/2024   [provider]  Active             Recommendation:   PCP Follow-up Specialty provider follow-up ORTHO appointment Check BP daily and record  Follow Up Plan:   Telephone follow-up in 1 month  Nestora Duos, MSN, RN [facility]  [facility], [facility] RN Care Manager Direct Dial: [phone] Fax: [phone]

## 2024-06-04 ENCOUNTER — Ambulatory Visit: Payer: Self-pay

## 2024-06-04 ENCOUNTER — Other Ambulatory Visit: Payer: Self-pay

## 2024-06-04 NOTE — Patient Instructions (Signed)
 Visit Information  Thank you for taking time to visit with me today. Please don't hesitate to contact me if I can be of assistance to you before our next scheduled appointment.  Your next care management appointment is by telephone on 07/02/2024 at 9:00 am  Telephone follow-up in 1 month  Please call the care guide team at [phone] if you need to cancel, schedule, or reschedule an appointment.   Please call the Suicide and Crisis Lifeline: 988 call the [place]  National Suicide Prevention Lifeline: 240-192-1095 or TTY: 928-503-6975 TTY 863-313-9665) to talk to a trained counselor call 1-800-273-TALK (toll free, 24 hour hotline) go to [facility] [address], [place] [phone]) call 911 if you are experiencing a Mental Health or Behavioral Health Crisis or need someone to talk to.  Nestora Duos, MSN, RN [place]  [facility], [facility] RN Care Manager Direct Dial: [phone] Fax: [phone]  You do not have any memory issues at this time but this information is to help you organize and prioritize tasks that are important for your health and wellbeing.  Memory Compensation/ORGANIZATION Strategies  Use WARM strategy.  W= write it down  A= associate it  R= repeat it  M= make a mental note  2.   You can keep a Glass blower/designer.  Use a 3-ring notebook with sections for the following: calendar, important names and phone numbers,  medications, doctors' names/phone numbers, lists/reminders, and a section to journal what you did  each day.   3.    Use a calendar to write appointments down.  4.    Write yourself a schedule for the day.  This can be placed on the calendar or in a separate section of the Memory Notebook.  Keeping a  regular schedule can help memory.  5.    Use medication organizer with sections for each day or morning/evening pills.  You may need help loading it  6.    Keep a basket, or  pegboard by the door.  Place items that you need to take out with you in the basket or on the pegboard.  You may also want to  include a message board for reminders.  7.    Use sticky notes.  Place sticky notes with reminders in a place where the task is performed.  For example:  turn off the  stove placed by the stove, lock the door placed on the door at eye level,  take your medications on  the bathroom mirror or by the place where you normally take your medications.  8.    Use alarms/timers.  Use while cooking to remind yourself to check on food or as a reminder to take your medicine, or as a  reminder to make a call, or as a reminder to perform another task, etc.

## 2024-06-04 NOTE — Telephone Encounter (Addendum)
 NT has made 3 attempts to call the patient without success.  Routing to clinic.       Copied from CRM [id number]. Topic: Clinical - Red Word Triage >> Jun 04, 2024 10:43 AM Roselie BROCKS wrote: Red Word that prompted transfer to Nurse Triage: I have scheduled patient for sports med appointment, but first available isnt until Oct 30th that is convenient for patients schedule, decision tree said to send message to NT

## 2024-06-04 NOTE — Patient Outreach (Signed)
 Complex Care Management   Visit Note  06/04/2024  Name:  Amy Leonard MRN: [id number] DOB: 12/06/1960  Situation: Referral received for Complex Care Management related to HTN I obtained verbal consent from Patient.  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   History of chemotherapy 2014   BREAST CA   HLD (hyperlipidemia)    HSV infection    recurrent (side and buttox)   Hypertension    Migraine    Personal history of chemotherapy 2014   bilateral breast ca   Personal history of radiation therapy 2014   bilateral breast ca   Radiation 2014   BREAST CA   Tobacco abuse    Wears dentures    top    Assessment: Patient Reported Symptoms:  Cognitive Cognitive Status: No symptoms reported Cognitive/Intellectual Conditions Management [RPT]: None reported or documented in medical history or problem list   Health Maintenance Behaviors: Annual physical exam  Neurological Neurological Review of Symptoms: Headaches Neurological Management Strategies: Routine screening Neurological Comment: migraine past few days with sinus issues and fatigue - improved with tylenol   HEENT   HEENT Management Strategies: Routine screening HEENT Comment: allergies    Cardiovascular   Cardiovascular Management Strategies: Routine screening Cardiovascular Comment: not able to fins BP cuff - advised ot purchace with insurance pharm card  Respiratory Respiratory Symptoms Reported: Dry cough Additional Respiratory Details: dry cough with sinus PND, allergies,  sneezing and stuffy nose, no sx fever or whezing - reports hx bronchitis in fall, discussed sx to report promptly to provider, smoking pack a day Respiratory Management Strategies: Routine screening, Medication therapy  Endocrine Is patient diabetic?: No    Gastrointestinal Additional Gastrointestinal Details: 2 days upset stomach, gassy,  no n/v/d, but feeling better, reports hydrating, appetite normal Gastrointestinal Management Strategies: Medication therapy    Genitourinary Genitourinary Symptoms Reported: Incontinence Additional Genitourinary Details: occasional incontinence Genitourinary Management Strategies: Incontinence garment/pad  Integumentary Integumentary Symptoms Reported: No symptoms reported Skin Management Strategies: Routine screening  Musculoskeletal Additional Musculoskeletal Details: right knee pain and swelling persists, low back pain, reports hurting back at work 3 separate times at work and not reporting, went away in a couple days, discussed need to report work injuries, no appointment for ortho Musculoskeletal Management Strategies: Medication therapy, Routine screening Falls in the past year?: No Number of falls in past year: 1 or less Was there an injury with Fall?: No Fall Risk Category Calculator: 0 Patient Fall Risk Level: Low Fall Risk Patient at Risk for Falls Due to: Impaired balance/gait, Orthopedic patient Fall risk Follow up: Falls evaluation completed, Falls prevention discussed  Psychosocial Psychosocial Symptoms Reported: No symptoms reported Behavioral Health Comment: patient reports difficultygetting things done, finishing things, discussed making list  -patint advised to prioritize 2 things to accomplish by next appointment -  appointments for PCP, Dentist, Eye Ortho, planned how to accomplish these today and need to start moving forward  06/04/2024    PHQ2-9 Depression Screening   Little interest or pleasure in doing things Several days   Feeling down, depressed, or hopeless Several days (down but snapped myself out of it)  PHQ-2 - Total Score 2  Trouble falling or staying asleep, or sleeping too much    Feeling tired or having little energy    Poor appetite or overeating     Feeling bad about yourself - or that you are a failure or have let yourself or your family down    Trouble concentrating on things, such as reading the newspaper or watching television    Moving or speaking so slowly that other people could have noticed.  Or the opposite - being so fidgety or restless that you have been moving around a lot more than usual    Thoughts that you would be better off dead, or hurting yourself in some way    PHQ2-9 Total Score    If you checked off any problems, how difficult have these problems made it for you to do your work, take care of things at home, or get along with other people    Depression Interventions/Treatment      There were no vitals filed for this visit.  Medications Reviewed Today     Reviewed by Devra Lands, RN (Registered Nurse) on 06/04/24 at 0908  Med List Status: <None>   Medication Order Taking? Sig Documenting Provider Last Dose Status Informant  albuterol  (VENTOLIN  HFA) 108 (90 Base) MCG/ACT inhaler [id number] Yes Inhale 2 puffs into the lungs every 4 (four) hours as needed for wheezing or shortness of breath. Tower, Laine LABOR, MD  Active   aspirin  81 MG chewable tablet [id number] Yes Chew 1 tablet (81 mg total) by mouth daily. Tower, Laine LABOR, MD  Active Self  atorvastatin  (LIPITOR) 10 MG tablet [id number] Yes TAKE 1 TABLET DAILY Tower, Laine LABOR, MD  Active   b complex vitamins tablet [id number] Yes Take 2 tablets by mouth daily. [provider]  Active Self  calcium  carbonate (TUMS - DOSED IN MG ELEMENTAL CALCIUM ) 500 MG chewable tablet [id number] Yes Chew by mouth as directed.  Patient taking differently: Chew by mouth as directed.  PRN Only   [provider]  Active Self  Calcium   Carbonate-Vitamin D  (CALCIUM  + D PO) [id number]  Take 1 tablet by mouth daily.  Patient not taking: Reported on 02/26/2024   [provider]  Active Self           Med Note HARVEY, [place] S   Wed Jun 19, 2022  9:45 AM)    ipratropium (ATROVENT) 0.03 % nasal spray [id number]  Place 2 sprays into both nostrils every 12 (twelve) hours.  Patient not taking: Reported on 03/11/2024   [provider]  Active   losartan  (COZAAR ) 25 MG tablet [id number] Yes TAKE 1 TABLET DAILY Tower, Laine LABOR, MD  Active   meloxicam  (MOBIC ) 15 MG tablet [id number]  TAKE 1 TABLET DAILY.  Patient not taking: Reported on 02/26/2024   Watt Mirza, MD  Active   Multiple Vitamins-Minerals (MULTIVITAMIN WOMEN 50+ PO) [id number]  Take 1 tablet by mouth daily.  Patient not taking: Reported on 04/09/2024   [provider]  Active   nicotine  (NICODERM CQ  - DOSED IN MG/24 HOURS) 14 mg/24hr patch [id number]  PLACE 1 PATCH ONTO THE SKIN DAILY.  Patient not taking: Reported on 12/30/2023   Tower, Laine LABOR, MD  Active   omeprazole  (PRILOSEC) 40 MG capsule  [id number] Yes Take 1 capsule (40 mg total) by mouth daily. Aneita Gwendlyn DASEN, MD  Active Self  Probiotic Product (PROBIOTIC DAILY PO) [id number]  Take 2 tablets by mouth daily.  Patient not taking: Reported on 02/26/2024   [provider]  Active             Recommendation:   PCP Follow-up Specialty provider follow-up Make PCP, Dental, Eye, Ortho appointments  Continue Current Plan of Care  Follow Up Plan:   Telephone follow-up in 1 month  Nestora Duos, MSN, RN [facility]  [facility], [facility] RN Care Manager Direct Dial: [phone] Fax: [phone]

## 2024-06-04 NOTE — Telephone Encounter (Signed)
 Please send a letter if she is unreachable (assume she has had a mychart message as well)

## 2024-06-21 ENCOUNTER — Ambulatory Visit: Admitting: Family Medicine

## 2024-06-21 ENCOUNTER — Encounter: Payer: Self-pay | Admitting: Family Medicine

## 2024-06-21 ENCOUNTER — Ambulatory Visit (INDEPENDENT_AMBULATORY_CARE_PROVIDER_SITE_OTHER)
Admission: RE | Admit: 2024-06-21 | Discharge: 2024-06-21 | Disposition: A | Discharge status: Home / Self Care | Source: Ambulatory Visit | Attending: Family Medicine | Admitting: Family Medicine

## 2024-06-21 ENCOUNTER — Ambulatory Visit: Payer: Self-pay | Admitting: Family Medicine

## 2024-06-21 VITALS — BP 130/76 | HR 73 | Temp 98.4°F | Ht 61.5 in | Wt 157.5 lb

## 2024-06-21 DIAGNOSIS — M25511 Pain in right shoulder: Secondary | ICD-10-CM | POA: Diagnosis not present

## 2024-06-21 DIAGNOSIS — M5126 Other intervertebral disc displacement, lumbar region: Secondary | ICD-10-CM | POA: Diagnosis not present

## 2024-06-21 DIAGNOSIS — M19011 Primary osteoarthritis, right shoulder: Secondary | ICD-10-CM | POA: Diagnosis not present

## 2024-06-21 DIAGNOSIS — Z981 Arthrodesis status: Secondary | ICD-10-CM | POA: Diagnosis not present

## 2024-06-21 DIAGNOSIS — G8929 Other chronic pain: Secondary | ICD-10-CM

## 2024-06-21 DIAGNOSIS — M549 Dorsalgia, unspecified: Secondary | ICD-10-CM

## 2024-06-21 DIAGNOSIS — M4310 Spondylolisthesis, site unspecified: Secondary | ICD-10-CM | POA: Diagnosis not present

## 2024-06-21 DIAGNOSIS — M51369 Other intervertebral disc degeneration, lumbar region without mention of lumbar back pain or lower extremity pain: Secondary | ICD-10-CM

## 2024-06-21 DIAGNOSIS — Z23 Encounter for immunization: Secondary | ICD-10-CM

## 2024-06-21 DIAGNOSIS — M47816 Spondylosis without myelopathy or radiculopathy, lumbar region: Secondary | ICD-10-CM | POA: Diagnosis not present

## 2024-06-21 MED ORDER — MELOXICAM 15 MG PO TABS: 15.0000 mg | ORAL_TABLET | Freq: Every day | ORAL | 1 refills | Status: AC | PRN

## 2024-06-21 NOTE — Progress Notes (Signed)
 Subjective:    Patient ID: Amy Leonard, female    DOB: Nov 10, 1960, 63 y.o.   MRN: [id number]  HPI  Wt Readings from Last 3 Encounters:  06/21/24 157 lb 8 oz (71.4 kg)  12/30/23 152 lb 6.4 oz (69.1 kg)  09/05/23 156 lb 3.2 oz (70.9 kg)   29.28 kg/m  Vitals:   06/21/24 0951  BP: 130/76  Pulse: 73  Temp: 98.4 F (36.9 C)  SpO2: 99%    Pt presents for c/o  Shoulder pain  Low back pain    Low back pain  Hurt it at work 2 mo ago-got better (heavy lifting) Then hurt a few more times  Makes her legs feel weak   Pain - across whole low back  Just above buttocks  Dull most of the time Occational sharp when walking  Pain does not shoot down in legs , but legs feel weak  Had one fall    Over the counter Tylenol   Occational ice     Had L4-5 surgery in past  Was told L2-3 were bad and she may need surgery in past   Pain in right shoulder and deltoid area  Pain is improved but her arm feels weaker than it used to be  Has appointment with Dr Watt - for knee/plans to bring it up  Can fully abduct shoulder  Difficult to lift heavy - then it hurts    DG Lumbar Spine 2-3 Views Result Date: 06/21/2024 EXAM: 2 or 3 VIEW(S) XRAY OF THE LUMBAR SPINE 06/21/2024 10:33:30 AM COMPARISON: Comparison 01/17/2020 CLINICAL HISTORY: intermittent low back pain , history of lumbar surgery in the past. FINDINGS: LUMBAR SPINE: BONES: Postsurgical changes are noted at L4-L5 with interbody fusion and posterior fixation. Scoliosis concave to the left is noted. Mild anterolisthesis. Mild retrolisthesis of L2 on L3 is noted. DISCS AND DEGENERATIVE CHANGES: Postsurgical changes at L4-L5 with interbody fusion and posterior fixation are noted. SOFT TISSUES: Aortic calcifications are seen. IMPRESSION: 1. Postsurgical changes at L4-L5. 2. Mild retrolisthesis of L2 on L3. 3. Left-concave scoliosis. Electronically signed by: Oneil Devonshire MD 06/21/2024 11:09 AM EDT RP Workstation: [id number]   DG  Shoulder Right Result Date: 06/21/2024 EXAM: 3 VIEW(S) XRAY OF THE RIGHT SHOULDER 06/21/2024 10:33:30 AM COMPARISON: None available. CLINICAL HISTORY: intermittent shoulder pain / worse with lifting. FINDINGS: BONES AND JOINTS: Degenerative changes of the glenohumeral and acromioclavicular joints are seen. No acute fracture or dislocation is noted. The underlying bony thorax appears within normal limits. SOFT TISSUES: Postsurgical changes in the right axilla are noted. IMPRESSION: 1. Degenerative change without acute abnormality. Electronically signed by: Oneil Devonshire MD 06/21/2024 11:07 AM EDT RP Workstation: [id number]     Patient Active Problem List   Diagnosis Date Noted   Right shoulder pain 06/21/2024   Financially insecure 12/30/2023   Estrogen deficiency 12/04/2022   Vitamin D  deficiency 12/04/2022   Current use of proton pump inhibitor 12/04/2022   Intermittent diarrhea 09/24/2022   Smokers' cough (HCC) 11/27/2021   Varicose veins with inflammation 07/30/2021   Osteopenia 08/03/2019   Drug-induced polyneuropathy 05/05/2019   Prediabetes 02/03/2018   Carcinoma of overlapping sites of left breast in female, estrogen receptor positive (HCC) 07/29/2016   Lymphedema 10/23/2015   Carpal tunnel syndrome 10/23/2015   Encounter for routine gynecological examination 02/10/2015   Routine general medical examination at a health care facility 02/07/2015   Degenerative disc disease, lumbar 02/04/2014   Anterolisthesis 02/04/2014   Low back pain 02/02/2014  Postmenopausal estrogen deficiency 09/02/2013   History of thrombocytopenia 01/06/2013   Colon cancer screening 09/01/2012   History of CVA (cerebrovascular accident) 04/17/2012   Pure hypercholesterolemia 04/05/2008   Essential hypertension 03/30/2008   ANXIETY DEPRESSION 11/17/2007   TOBACCO USE 11/17/2007   Chronic back pain 09/23/2007   Migraine headache 06/26/2000   Past Medical History:  Diagnosis Date   Anxiety    Anxiety  and depression    Barrett's esophageal ulceration    Basal ganglia infarction (HCC) 04/17/2012   Breast cancer (HCC) 2014   BILATERAL LUMPECTOMY   Carpal tunnel syndrome    Chronic back pain    spinal stenosis   Depression    Elevated WBC count    nl diff   Erosive gastritis    Folliculitis 05/05/2011   GERD (gastroesophageal reflux disease)    Glaucoma    ?   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   BREAST LUMPECTOMY Bilateral 2014   bilateral breast ca   BTL     CHOLECYSTECTOMY  01/2021   COLONOSCOPY     epidural steroid injection  2008   ERCP N/A 01/29/2021   Procedure: ENDOSCOPIC RETROGRADE CHOLANGIOPANCREATOGRAPHY (ERCP);  Surgeon: Jinny Carmine, MD;  Location: [facility];  Service: Endoscopy;  Laterality: N/A;   ERCP N/A 04/03/2021   Procedure: ENDOSCOPIC RETROGRADE CHOLANGIOPANCREATOGRAPHY (ERCP);  Surgeon: Jinny Carmine, MD;  Location: [facility];  Service: Endoscopy;  Laterality: N/A;   LUMBAR FUSION  03/2016   L4-5   PARTIAL MASTECTOMY WITH NEEDLE LOCALIZATION AND AXILLARY SENTINEL LYMPH NODE BX Bilateral 11/23/2012   Procedure: PARTIAL MASTECTOMY WITH NEEDLE LOCALIZATION AND AXILLARY SENTINEL LYMPH NODE BX;  Surgeon: Elon CHRISTELLA Pacini, MD;  Location: [facility];  Service: General;  Laterality: Bilateral;   PORT-A-CATH REMOVAL Right 08/25/2013   Procedure: REMOVAL PORT-A-CATH;  Surgeon: Elon CHRISTELLA Pacini, MD;  Location: [facility];  Service: General;  Laterality: Right;   PORTACATH PLACEMENT  Right 11/23/2012   Procedure: INSERTION PORT-A-CATH;  Surgeon: Elon CHRISTELLA Pacini, MD;  Location: [facility];  Service: General;  Laterality: Right;   TUBAL LIGATION     UPPER GI ENDOSCOPY     Social History   Tobacco Use   Smoking status: Every Day    Current packs/day: 1.00    Average packs/day: 0.8 packs/day for 73.8 years (57.3 ttl pk-yrs)    Types: Cigarettes    Start date: 1985   Smokeless tobacco: Never  Vaping Use   Vaping status: Never Used  Substance Use Topics   Alcohol use: Yes    Alcohol/week: 0.0 standard drinks of alcohol    Comment: beer rarely   Drug use: No   Family History  Problem Relation Age of Onset   Stroke Maternal Grandmother    Diabetes Maternal Grandmother    Diabetes Mother    Hypertension Mother    Leukemia Mother    Obesity Mother  Depression Sister    Bipolar disorder Son        Bipolar / oppositional defiant   Alcohol abuse Sister    Drug abuse Sister    Alcohol abuse Sister    Drug abuse Sister    Alcohol abuse Brother    Drug abuse Brother    Pancreatic cancer Brother    Colon cancer Neg Hx    Breast cancer Neg Hx    Allergies  Allergen Reactions   Gabapentin  Swelling   Lyrica  [Pregabalin ] Other (See Comments)    sedated   Voltaren  [Diclofenac  Sodium] Nausea And Vomiting   Bupropion Hcl Nausea Only and Other (See Comments)     GI, sleepy   Chlorhexidine  Gluconate Itching and Rash   Hydrocod Poli-Chlorphe Poli Er Nausea And Vomiting    vomiting   Lisinopril  Other (See Comments)    Leg cramps     Naproxen Sodium Other (See Comments)    does not tolerate   Current Outpatient Medications on File Prior to Visit  Medication Sig Dispense Refill   albuterol  (VENTOLIN  HFA) 108 (90 Base) MCG/ACT inhaler Inhale 2 puffs into the lungs every 4 (four) hours as needed for wheezing or shortness of breath. 1 each 1   aspirin  81 MG chewable tablet Chew 1 tablet (81 mg total) by mouth daily. 90 tablet 3   atorvastatin   (LIPITOR) 10 MG tablet TAKE 1 TABLET DAILY 90 tablet 2   b complex vitamins tablet Take 2 tablets by mouth daily.     calcium  carbonate (TUMS - DOSED IN MG ELEMENTAL CALCIUM ) 500 MG chewable tablet Chew by mouth as directed. (Patient taking differently: Chew by mouth as directed.  PRN Only)     Calcium  Carbonate-Vitamin D  (CALCIUM  + D PO) Take 1 tablet by mouth daily.     ipratropium (ATROVENT) 0.03 % nasal spray Place 2 sprays into both nostrils every 12 (twelve) hours.     losartan  (COZAAR ) 25 MG tablet TAKE 1 TABLET DAILY 90 tablet 2   Multiple Vitamins-Minerals (MULTIVITAMIN WOMEN 50+ PO) Take 1 tablet by mouth daily.     omeprazole  (PRILOSEC) 40 MG capsule Take 1 capsule (40 mg total) by mouth daily. 90 capsule 0   Probiotic Product (PROBIOTIC DAILY PO) Take 2 tablets by mouth daily.     nicotine  (NICODERM CQ  - DOSED IN MG/24 HOURS) 14 mg/24hr patch PLACE 1 PATCH ONTO THE SKIN DAILY. (Patient not taking: Reported on 06/21/2024) 28 patch 2   No current facility-administered medications on file prior to visit.    Review of Systems  Constitutional:  Negative for activity change, appetite change, fatigue, fever and unexpected weight change.  HENT:  Negative for congestion, rhinorrhea, sore throat and trouble swallowing.   Eyes:  Negative for pain, redness, itching and visual disturbance.  Respiratory:  Negative for cough, chest tightness, shortness of breath and wheezing.   Cardiovascular:  Negative for chest pain and palpitations.  Gastrointestinal:  Negative for abdominal pain, blood in stool, constipation, diarrhea and nausea.  Endocrine: Negative for cold intolerance, heat intolerance, polydipsia and polyuria.  Genitourinary:  Negative for difficulty urinating, dysuria, frequency and urgency.  Musculoskeletal:  Positive for arthralgias, back pain and myalgias. Negative for gait problem and joint swelling.  Skin:  Negative for pallor and rash.  Neurological:  Negative for dizziness,  tremors, weakness, numbness and headaches.  Hematological:  Negative for adenopathy. Does not bruise/bleed easily.  Psychiatric/Behavioral:  Negative for decreased concentration and dysphoric mood. The patient is not nervous/anxious.  Objective:   Physical Exam Constitutional:      General: She is not in acute distress.    Appearance: Normal appearance. She is well-developed.     Comments: Overweight   HENT:     Head: Normocephalic and atraumatic.  Eyes:     Conjunctiva/sclera: Conjunctivae normal.     Pupils: Pupils are equal, round, and reactive to light.  Neck:     Thyroid : No thyromegaly.     Vascular: No carotid bruit or JVD.  Cardiovascular:     Rate and Rhythm: Normal rate and regular rhythm.     Heart sounds: Normal heart sounds.     No gallop.  Pulmonary:     Effort: Pulmonary effort is normal. No respiratory distress.     Breath sounds: Normal breath sounds. No wheezing or rales.     Comments: Diffusely distant bs  Abdominal:     General: There is distension. There is no abdominal bruit.  Musculoskeletal:     Cervical back: Normal range of motion and neck supple.     Lumbar back: Tenderness present. No swelling, deformity or bony tenderness. Decreased range of motion. Negative right straight leg raise test and negative left straight leg raise test. Scoliosis present.     Right lower leg: No edema.     Left lower leg: No edema.     Comments: Right shoulder  No deformity/swelling/warmth or erythema  No crepitus  No obvious effusion  Abduction -normal with some discomfort  Hawking test neg Neer test neg  Internal rotation full External rotation full Tenderness slight/acronion Normal grip and hand dexterity  Pain with ext of LS  Tender over bilateral lumbar musculature-worse on left and with left lat bend/ mild overall  Neg slr       Lymphadenopathy:     Cervical: No cervical adenopathy.  Skin:    General: Skin is warm and dry.     Coloration:  Skin is not pale.     Findings: No rash.  Neurological:     Mental Status: She is alert.     Coordination: Coordination normal.     Deep Tendon Reflexes: Reflexes are normal and symmetric. Reflexes normal.  Psychiatric:        Mood and Affect: Mood normal.           Assessment & Plan:   Problem List Items Addressed This Visit       Musculoskeletal and Integument   Degenerative disc disease, lumbar   Past L4-5 surgery  More back pain recently       Relevant Orders   DG Lumbar Spine 2-3 Views (Completed)   Anterolisthesis   Now L2-3         Other   Right shoulder pain   Some discomfort/occational weak feeling  Xray today notes degenerative change She will see Dr Watt   PT may be option      Relevant Orders   DG Shoulder Right (Completed)   Chronic back pain - Primary   Acute on chronic  Bilateral/midline , with history of low LS surgery in past No neuro changes  Suspect deconditioning  Xr notes some retrolisthesis L2 on L3 and some scoliosis  Consider PT       Relevant Medications   meloxicam  (MOBIC ) 15 MG tablet   Other Relevant Orders   DG Lumbar Spine 2-3 Views (Completed)   Other Visit Diagnoses       Need for influenza vaccination  Relevant Orders   Flu vaccine trivalent PF, 6mos and older(Flulaval,Afluria,Fluarix,Fluzone) (Completed)

## 2024-06-21 NOTE — Assessment & Plan Note (Signed)
 Past L4-5 surgery  More back pain recently

## 2024-06-21 NOTE — Assessment & Plan Note (Signed)
 Some discomfort/occational weak feeling  Xray today notes degenerative change She will see Dr Watt   PT may be option

## 2024-06-21 NOTE — Assessment & Plan Note (Signed)
 Acute on chronic  Bilateral/midline , with history of low LS surgery in past No neuro changes  Suspect deconditioning  Xr notes some retrolisthesis L2 on L3 and some scoliosis  Consider PT

## 2024-06-21 NOTE — Assessment & Plan Note (Signed)
 Now L2-3

## 2024-06-21 NOTE — Patient Instructions (Signed)
 Xray of shoulder and back today   We will reach out with results and then make a plan   See Dr Watt as planned   I sent in meloxicam  to mail order pharmacy    Heat/ice are ok

## 2024-06-22 NOTE — Progress Notes (Signed)
     Amy Sandstrom T. Alexavier Tsutsui, MD, CAQ Sports Medicine [facility] [address] [place], [address]  Phone: [phone]  FAX: [phone]  Amy Leonard - 63 y.o. female  MRN [id number]  Date of Birth: 04/08/1961  Date: 06/24/2024  PCP: Randeen Laine LABOR, MD  Referral: Randeen Laine LABOR, MD  No chief complaint on file.  Subjective:   Amy Leonard is a 63 y.o. very pleasant female patient with There is no height or weight on file to calculate BMI. who presents with the following:  Discussed the use of AI scribe software for clinical note transcription with the patient, who gave verbal consent to proceed.  Patient presents for evaluation of ongoing knee pain.  I saw her last 2 years ago with some right knee pain and osteoarthritis.  At that point, I gave her some meloxicam  and did an intra-articular knee injection. History of Present Illness     Review of Systems is noted in the HPI, as appropriate  Objective:   There were no vitals taken for this visit.  GEN: No acute distress; alert,appropriate. PULM: Breathing comfortably in no respiratory distress PSYCH: Normally interactive.   Laboratory and Imaging Data:  Assessment and Plan:   No diagnosis found. Assessment & Plan   Medication Management during today's office visit: No orders of the defined types were placed in this encounter.  There are no discontinued medications.  Orders placed today for conditions managed today: No orders of the defined types were placed in this encounter.   Disposition: No follow-ups on file.  [employer] One speech-to-text software was used for transcription in this dictation.  Possible transcriptional errors can occur using Animal nutritionist.   Signed,  Jacques DASEN. Dalten Ambrosino, MD   Outpatient Encounter Medications as of 06/24/2024  Medication Sig   albuterol  (VENTOLIN  HFA) 108 (90 Base) MCG/ACT inhaler Inhale 2 puffs into the lungs every 4  (four) hours as needed for wheezing or shortness of breath.   aspirin  81 MG chewable tablet Chew 1 tablet (81 mg total) by mouth daily.   atorvastatin  (LIPITOR) 10 MG tablet TAKE 1 TABLET DAILY   b complex vitamins tablet Take 2 tablets by mouth daily.   calcium  carbonate (TUMS - DOSED IN MG ELEMENTAL CALCIUM ) 500 MG chewable tablet Chew by mouth as directed. (Patient taking differently: Chew by mouth as directed.  PRN Only)   Calcium  Carbonate-Vitamin D  (CALCIUM  + D PO) Take 1 tablet by mouth daily.   ipratropium (ATROVENT) 0.03 % nasal spray Place 2 sprays into both nostrils every 12 (twelve) hours.   losartan  (COZAAR ) 25 MG tablet TAKE 1 TABLET DAILY   meloxicam  (MOBIC ) 15 MG tablet Take 1 tablet (15 mg total) by mouth daily as needed for pain. With food   Multiple Vitamins-Minerals (MULTIVITAMIN WOMEN 50+ PO) Take 1 tablet by mouth daily.   nicotine  (NICODERM CQ  - DOSED IN MG/24 HOURS) 14 mg/24hr patch PLACE 1 PATCH ONTO THE SKIN DAILY. (Patient not taking: Reported on 06/21/2024)   omeprazole  (PRILOSEC) 40 MG capsule Take 1 capsule (40 mg total) by mouth daily.   Probiotic Product (PROBIOTIC DAILY PO) Take 2 tablets by mouth daily.   No facility-administered encounter medications on file as of 06/24/2024.

## 2024-06-24 ENCOUNTER — Encounter: Payer: Self-pay | Admitting: Family Medicine

## 2024-06-24 ENCOUNTER — Ambulatory Visit
Admission: RE | Admit: 2024-06-24 | Discharge: 2024-06-24 | Disposition: A | Source: Ambulatory Visit | Attending: Family Medicine | Admitting: Family Medicine

## 2024-06-24 ENCOUNTER — Ambulatory Visit: Admitting: Family Medicine

## 2024-06-24 VITALS — BP 110/72 | HR 57 | Temp 97.5°F | Ht 61.5 in | Wt 156.2 lb

## 2024-06-24 DIAGNOSIS — M25461 Effusion, right knee: Secondary | ICD-10-CM | POA: Diagnosis not present

## 2024-06-24 DIAGNOSIS — M1711 Unilateral primary osteoarthritis, right knee: Secondary | ICD-10-CM | POA: Diagnosis not present

## 2024-06-24 DIAGNOSIS — G8929 Other chronic pain: Secondary | ICD-10-CM

## 2024-06-24 DIAGNOSIS — M79671 Pain in right foot: Secondary | ICD-10-CM

## 2024-06-24 DIAGNOSIS — M549 Dorsalgia, unspecified: Secondary | ICD-10-CM

## 2024-06-24 MED ORDER — TRIAMCINOLONE ACETONIDE 40 MG/ML IJ SUSP
40.0000 mg | Freq: Once | INTRAMUSCULAR | Status: AC
  Administered 2024-06-24: 40 mg via INTRA_ARTICULAR

## 2024-06-25 ENCOUNTER — Encounter: Payer: Self-pay | Admitting: Family Medicine

## 2024-06-28 ENCOUNTER — Telehealth: Payer: Self-pay

## 2024-06-28 NOTE — Telephone Encounter (Signed)
 Benefit request submitted for Orthovisc gel injection of the right knee, preferred gel for [employer]  Will reach out to provider when benefit coverage is provided

## 2024-06-29 NOTE — Telephone Encounter (Signed)
 Waiting on prior authorization from [facility]   Will notify provider when final benefit summary is available.

## 2024-07-02 ENCOUNTER — Other Ambulatory Visit: Payer: Self-pay

## 2024-07-02 NOTE — Patient Outreach (Signed)
 Complex Care Management   Visit Note  07/02/2024  Name:  Amy Leonard MRN: [id number] DOB: [birth year]  Situation: Referral received for Complex Care Management related to HTN I obtained verbal consent from Patient.  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   History of chemotherapy 2014   BREAST CA   HLD (hyperlipidemia)    HSV infection    recurrent (side and buttox)   Hypertension    Migraine    Personal history of chemotherapy 2014   bilateral breast ca   Personal history of radiation therapy 2014   bilateral breast ca   Radiation 2014   BREAST CA   Tobacco abuse    Wears dentures    top    Assessment: Patient Reported Symptoms:  Cognitive Cognitive Status: No symptoms reported Cognitive/Intellectual Conditions Management [RPT]: None reported or documented in medical history or problem list   Health Maintenance Behaviors: Annual physical exam, Immunizations (06/21/2024)  Neurological Neurological Review of Symptoms: Headaches Neurological Comment: occasioanl migraines and dizziness, improved overall, prior to knee injection back and knee huritng a lot and weak legs, resulting in fall trying to step up onto curb, scrapes only  HEENT HEENT Symptoms Reported: No symptoms reported HEENT Management Strategies: Routine screening    Cardiovascular Cardiovascular Symptoms Reported: No symptoms reported Does patient have uncontrolled Hypertension?: No Cardiovascular Management Strategies: Routine screening Cardiovascular Comment:  found BP cuff and plans to start using - will send education also, BP good at last visits  Respiratory Other Respiratory Symptoms: resports not needing inhaler, allergy sx only - sneezing occasionally, smoking a pack a day, reports will try again to quit I've got to do this wants to start using elliptical machine she has at home Respiratory Management Strategies: Routine screening, Medication therapy  Endocrine Endocrine Symptoms Reported: Not assessed Is patient diabetic?: No    Gastrointestinal Gastrointestinal Symptoms Reported: No symptoms reported Gastrointestinal Management Strategies: Medication therapy Gastrointestinal Comment: appetite good, low salt heart healthy diet, hydrating mostly water    Genitourinary Genitourinary Symptoms Reported: Incontinence Genitourinary Management Strategies: Incontinence garment/pad  Integumentary Integumentary Symptoms Reported: No symptoms reported Skin Management Strategies: Routine screening  Musculoskeletal Additional Musculoskeletal Details: reports signifcant relief of knee pain after injection. also back improved, likely to gait and posture improved, prior, had back pain and leg weakness, fell trying to step up onto curb, scrapes only PCP aware Musculoskeletal Management Strategies: Routine screening, Medication therapy Falls in the past year?: Yes Number of falls in past year: 1 or less Was there an injury with Fall?: No Fall Risk Category Calculator: 1 Patient Fall Risk Level: Low Fall Risk Patient at Risk for Falls Due to: History of fall(s), Orthopedic patient Fall risk Follow up: Falls evaluation completed, Falls prevention discussed  Psychosocial Psychosocial Symptoms Reported: No symptoms reported          07/02/2024    PHQ2-9 Depression Screening   Little interest or pleasure in doing  things Not at all  Feeling down, depressed, or hopeless Several days  PHQ-2 - Total Score 1  Trouble falling or staying asleep, or sleeping  too much    Feeling tired or having little energy    Poor appetite or overeating     Feeling bad about yourself - or that you are a failure or have let yourself or your family down    Trouble concentrating on things, such as reading the newspaper or watching television    Moving or speaking so slowly that other people could have noticed.  Or the opposite - being so fidgety or restless that you have been moving around a lot more than usual    Thoughts that you would be better off dead, or hurting yourself in some way    PHQ2-9 Total Score    If you checked off any problems, how difficult have these problems made it for you to do your work, take care of things at home, or get along with other people    Depression Interventions/Treatment      There were no vitals filed for this visit.  Medications Reviewed Today     Reviewed by Devra Lands, RN (Registered Nurse) on 07/02/24 at 0912  Med List Status: <None>   Medication Order Taking? Sig Documenting Provider Last Dose Status Informant  albuterol  (VENTOLIN  HFA) 108 (90 Base) MCG/ACT inhaler [id number] Yes Inhale 2 puffs into the lungs every 4 (four) hours as needed for wheezing or shortness of breath. Tower, Laine LABOR, MD  Active   aspirin  81 MG chewable tablet [id number] Yes Chew 1 tablet (81 mg total) by mouth daily. Tower, Laine LABOR, MD  Active Self  atorvastatin  (LIPITOR) 10 MG tablet [id number] Yes TAKE 1 TABLET DAILY Tower, Laine LABOR, MD  Active   b complex vitamins tablet [id number] Yes Take 2 tablets by mouth daily.  Patient taking differently: Take 1 tablet by mouth daily. Patient reports 1 daily   [provider]  Active Self  calcium  carbonate (TUMS - DOSED IN MG ELEMENTAL CALCIUM ) 500 MG chewable tablet [id number] Yes Chew 1 tablet by mouth daily as needed. [provider]  Active Self  Calcium  Carbonate-Vitamin D  (CALCIUM  + D PO) [id number] Yes Take 1 tablet by mouth daily. [provider]  Active Self            Med Note HARVEY, DONNA S   Wed Jun 19, 2022  9:45 AM)    ipratropium (ATROVENT) 0.03 % nasal spray [id number]  Place 2 sprays into both nostrils every 12 (twelve) hours.  Patient not taking: Reported on 07/02/2024   [provider]  Active   losartan  (COZAAR ) 25 MG tablet [id number] Yes TAKE 1 TABLET DAILY Tower, Laine LABOR, MD  Active   meloxicam  (MOBIC ) 15 MG tablet [id number] Yes Take 1 tablet (15 mg total) by mouth daily as needed for pain. With food Tower, Laine LABOR, MD  Active   Multiple Vitamins-Minerals (MULTIVITAMIN WOMEN 50+ PO) [id number]  Take 1 tablet by mouth daily.  Patient not taking: Reported on 07/02/2024   [provider]  Active   omeprazole  (PRILOSEC) 40 MG capsule [id number] Yes Take 1 capsule (40 mg total) by mouth daily. Aneita Gwendlyn DASEN, MD  Active Self  Probiotic Product (PROBIOTIC DAILY PO) [id number]  Take 2 tablets by mouth daily.  Patient not taking: Reported on 07/02/2024   [provider]  Active             Recommendation:  PCP Follow-up Specialty provider follow-up Make dental and eye appointments Continue Current Plan of Care  Follow Up Plan:   Telephone follow-up in 1 month  Nestora Duos, MSN, RN [facility]  [facility], [facility] RN Care Manager Direct Dial: [phone] Fax: [phone]

## 2024-07-02 NOTE — Patient Instructions (Signed)
 [name] - I am sorry I was unable to reach you today for our scheduled appointment. I work with Tower, Laine LABOR, MD and am calling to support your healthcare needs. Please contact me at [phone] at your earliest convenience. I look forward to speaking with you soon.   Thank you,  Nestora Duos, MSN, RN [facility]  [facility], [facility] RN Care Manager Direct Dial: [phone] Fax: [phone]

## 2024-07-02 NOTE — Patient Instructions (Signed)
 Visit Information  Thank you for taking time to visit with me today. Please don't hesitate to contact me if I can be of assistance to you before our next scheduled appointment.  Your next care management appointment is by telephone on 07/30/2024 at 9:00 am  Telephone follow-up in 1 month  Please call the care guide team at [phone] if you need to cancel, schedule, or reschedule an appointment.   Please call the Suicide and Crisis Lifeline: 988 call the [place]  National Suicide Prevention Lifeline: 619 733 8595 or TTY: 773-063-4253 TTY 367 512 1028) to talk to a trained counselor call 1-800-273-TALK (toll free, 24 hour hotline) go to [facility] [address], [place] [phone]) call 911 if you are experiencing a Mental Health or Behavioral Health Crisis or need someone to talk to.  Nestora Duos, MSN, RN [place]  [facility], [facility] RN Care Manager Direct Dial: [phone] Fax: [phone]   Managing Your Hypertension Hypertension, also called high blood pressure, is when the force of the blood pressing against the walls of the arteries is too strong. Arteries are blood vessels that carry blood from your heart throughout your body. Hypertension forces the heart to work harder to pump blood and may cause the arteries to become narrow or stiff. Understanding blood pressure readings A blood pressure reading includes a higher number over a lower number: The first, or top, number is called the systolic pressure. It is a measure of the pressure in your arteries as your heart beats. The second, or bottom number, is called the diastolic pressure. It is a measure of the pressure in your arteries as the heart relaxes. For most people, a normal blood pressure is below 120/80. Your personal target blood pressure may vary depending on your medical conditions, your age, and other factors. Blood pressure is classified  into four stages. Based on your blood pressure reading, your health care provider may use the following stages to determine what type of treatment you need, if any. Systolic pressure and diastolic pressure are measured in a unit called millimeters of mercury (mmHg). Normal Systolic pressure: below 120. Diastolic pressure: below 80. Elevated Systolic pressure: 120-129. Diastolic pressure: below 80. Hypertension stage 1 Systolic pressure: 130-139. Diastolic pressure: 80-89. Hypertension stage 2 Systolic pressure: 140 or above. Diastolic pressure: 90 or above. How can this condition affect me? Managing your hypertension is very important. Over time, hypertension can damage the arteries and decrease blood flow to parts of the body, including the brain, heart, and kidneys. Having untreated or uncontrolled hypertension can lead to: A heart attack. A stroke. A weakened blood vessel (aneurysm). Heart failure. Kidney damage. Eye damage. Memory and concentration problems. Vascular dementia. What actions can I take to manage this condition? Hypertension can be managed by making lifestyle changes and possibly by taking medicines. Your health care provider will help you make a plan to bring your blood pressure within a normal range. You may be referred for counseling on a healthy diet and physical activity. Nutrition  Eat a diet that is high in fiber and potassium, and low in salt (sodium), added sugar, and fat. An example eating plan is called the DASH diet. DASH stands for Dietary Approaches to Stop Hypertension. To eat this way: Eat plenty of fresh fruits and vegetables. Try to fill one-half of your plate at each meal with fruits and vegetables. Eat whole grains, such as whole-wheat pasta, brown rice, or whole-grain bread. Fill about one-fourth of your plate with whole  grains. Eat low-fat dairy products. Avoid fatty cuts of meat, processed or cured meats, and poultry with skin. Fill about  one-fourth of your plate with lean proteins such as fish, chicken without skin, beans, eggs, and tofu. Avoid pre-made and processed foods. These tend to be higher in sodium, added sugar, and fat. Reduce your daily sodium intake. Many people with hypertension should eat less than 1,500 mg of sodium a day. Lifestyle  Work with your health care provider to maintain a healthy body weight or to lose weight. Ask what an ideal weight is for you. Get at least 30 minutes of exercise that causes your heart to beat faster (aerobic exercise) most days of the week. Activities may include walking, swimming, or biking. Include exercise to strengthen your muscles (resistance exercise), such as weight lifting, as part of your weekly exercise routine. Try to do these types of exercises for 30 minutes at least 3 days a week. Do not use any products that contain nicotine  or tobacco. These products include cigarettes, chewing tobacco, and vaping devices, such as e-cigarettes. If you need help quitting, ask your health care provider. Control any long-term (chronic) conditions you have, such as high cholesterol or diabetes. Identify your sources of stress and find ways to manage stress. This may include meditation, deep breathing, or making time for fun activities. Alcohol use Do not drink alcohol if: Your health care provider tells you not to drink. You are pregnant, may be pregnant, or are planning to become pregnant. If you drink alcohol: Limit how much you have to: 0-1 drink a day for women. 0-2 drinks a day for men. Know how much alcohol is in your drink. In the [place] one drink equals one 12 oz bottle of beer (355 mL), one 5 oz glass of wine (148 mL), or one 1 oz glass of hard liquor (44 mL). Medicines Your health care provider may prescribe medicine if lifestyle changes are not enough to get your blood pressure under control and if: Your systolic blood pressure is 130 or higher. Your diastolic blood pressure  is 80 or higher. Take medicines only as told by your health care provider. Follow the directions carefully. Blood pressure medicines must be taken as told by your health care provider. The medicine does not work as well when you skip doses. Skipping doses also puts you at risk for problems. Monitoring Before you monitor your blood pressure: Do not smoke, drink caffeinated beverages, or exercise within 30 minutes before taking a measurement. Use the bathroom and empty your bladder (urinate). Sit quietly for at least 5 minutes before taking measurements. Monitor your blood pressure at home as told by your health care provider. To do this: Sit with your back straight and supported. Place your feet flat on the floor. Do not cross your legs. Support your arm on a flat surface, such as a table. Make sure your upper arm is at heart level. Each time you measure, take two or three readings one minute apart and record the results. You may also need to have your blood pressure checked regularly by your health care provider. General information Talk with your health care provider about your diet, exercise habits, and other lifestyle factors that may be contributing to hypertension. Review all the medicines you take with your health care provider because there may be side effects or interactions. Keep all follow-up visits. Your health care provider can help you create and adjust your plan for managing your high blood pressure. Where to  find more information National Heart, Lung, and Blood Institute: popsteam.is American Heart Association: www.heart.org Contact a health care provider if: You think you are having a reaction to medicines you have taken. You have repeated (recurrent) headaches. You feel dizzy. You have swelling in your ankles. You have trouble with your vision. Get help right away if: You develop a severe headache or confusion. You have unusual weakness or numbness, or you feel  faint. You have severe pain in your chest or abdomen. You vomit repeatedly. You have trouble breathing. These symptoms may be an emergency. Get help right away. Call 911. Do not wait to see if the symptoms will go away. Do not drive yourself to the hospital. Summary Hypertension is when the force of blood pumping through your arteries is too strong. If this condition is not controlled, it may put you at risk for serious complications. Your personal target blood pressure may vary depending on your medical conditions, your age, and other factors. For most people, a normal blood pressure is less than 120/80. Hypertension is managed by lifestyle changes, medicines, or both. Lifestyle changes to help manage hypertension include losing weight, eating a healthy, low-sodium diet, exercising more, stopping smoking, and limiting alcohol. This information is not intended to replace advice given to you by your health care provider. Make sure you discuss any questions you have with your health care provider. Document Revised: 04/26/2021 Document Reviewed: 04/26/2021 Elsevier Patient Education  2024 [employer].   Understanding Your Risk for Falls Millions of people have serious injuries from falls each year. It is important to understand your risk of falling. Talk with your health care provider about your risk and what you can do to lower it. If you do have a serious fall, make sure to tell your provider. Falling once raises your risk of falling again. How can falls affect me? Serious injuries from falls are common. These include: Broken bones, such as hip fractures. Head injuries, such as traumatic brain injuries (TBI) or concussions. A fear of falling can cause you to avoid activities and stay at home. This can make your muscles weaker and raise your risk for a fall. What can increase my risk? There are a number of risk factors that increase your risk for falling. The more risk factors you have, the  higher your risk of falling. Serious injuries from a fall happen most often to people who are older than 63 years old. Teenagers and young adults ages 22-29 are also at higher risk. Common risk factors include: Weakness in the lower body. Being generally weak or confused due to long-term (chronic) illness. Dizziness or balance problems. Poor vision. Medicines that cause dizziness or drowsiness. These may include: Medicines for your blood pressure, heart, anxiety, insomnia, or swelling (edema). Pain medicines. Muscle relaxants. Other risk factors include: Drinking alcohol. Having had a fall in the past. Having foot pain or wearing improper footwear. Working at a dangerous job. Having any of the following in your home: Tripping hazards, such as floor clutter or loose rugs. Poor lighting. Pets. Having dementia or memory loss. What actions can I take to lower my risk of falling?     Physical activity Stay physically fit. Do strength and balance exercises. Consider taking a regular class to build strength and balance. Yoga and tai chi are good options. Vision Have your eyes checked every year and your prescription for glasses or contacts updated as needed. Shoes and walking aids Wear non-skid shoes. Wear shoes that have rubber  soles and low heels. Do not wear high heels. Do not walk around the house in socks or slippers. Use a cane or walker as told by your provider. Home safety Attach secure railings on both sides of your stairs. Install grab bars for your bathtub, shower, and toilet. Use a non-skid mat in your bathtub or shower. Attach bath mats securely with double-sided, non-slip rug tape. Use good lighting in all rooms. Keep a flashlight near your bed. Make sure there is a clear path from your bed to the bathroom. Use night-lights. Do not use throw rugs. Make sure all carpeting is taped or tacked down securely. Remove all clutter from walkways and stairways, including  extension cords. Repair uneven or broken steps and floors. Avoid walking on icy or slippery surfaces. Walk on the grass instead of on icy or slick sidewalks. Use ice melter to get rid of ice on walkways in the winter. Use a cordless phone. Questions to ask your health care provider Can you help me check my risk for a fall? Do any of my medicines make me more likely to fall? Should I take a vitamin D  supplement? What exercises can I do to improve my strength and balance? Should I make an appointment to have my vision checked? Do I need a bone density test to check for weak bones (osteoporosis)? Would it help to use a cane or a walker? Where to find more information Centers for Disease Control and Prevention, STEAD[employer]: tonerpromos.no Community-Based Fall Prevention Programs: tonerpromos.no [employer] on Aging: baseringtones.pl Contact a health care provider if: You fall at home. You are afraid of falling at home. You feel weak, drowsy, or dizzy. This information is not intended to replace advice given to you by your health care provider. Make sure you discuss any questions you have with your health care provider. Document Revised: 04/15/2022 Document Reviewed: 04/15/2022 Elsevier Patient Education  2024 Else[employer] Inc.   How to Take Your Blood Pressure Blood pressure measures how strongly your blood is pressing against the walls of your arteries. Arteries are blood vessels that carry blood from your heart throughout your body. You can take your blood pressure at home with a machine. You may need to check your blood pressure at home: To check if you have high blood pressure (hypertension). To check your blood pressure over time. To make sure your blood pressure medicine is working. Supplies needed: Blood pressure machine, or monitor. A chair to sit in. This should be a chair where you can sit upright with your back supported. Do not sit on a soft couch or an armchair. Table or desk. Small  notebook. Pencil or pen. How to prepare Avoid these things for 30 minutes before checking your blood pressure: Having drinks with caffeine in them, such as coffee or tea. Drinking alcohol. Eating. Smoking. Exercising. Do these things five minutes before checking your blood pressure: Go to the bathroom and pee (urinate). Sit in a chair. Be quiet. Do not talk. How to take your blood pressure Follow the instructions that came with your machine. If you have a digital blood pressure monitor, these may be the instructions: Sit up straight. Place your feet on the floor. Do not cross your ankles or legs. Rest your left arm at the level of your heart. You may rest it on a table, desk, or chair. Pull up your shirt sleeve. Wrap the blood pressure cuff around the upper part of your left arm. The cuff should be 1 inch (2.5  cm) above your elbow. It is best to wrap the cuff around bare skin. Fit the cuff snugly around your arm, but not too tightly. You should be able to place only one finger between the cuff and your arm. Place the cord so that it rests in the bend of your elbow. Press the power button. Sit quietly while the cuff fills with air and loses air. Write down the numbers on the screen. Wait 2-3 minutes and then repeat steps 1-10. What do the numbers mean? Two numbers make up your blood pressure. The first number is called systolic pressure. The second is called diastolic pressure. An example of a blood pressure reading is 120 over 80 (or 120/80). If you are an adult and do not have a medical condition, use this guide to find out if your blood pressure is normal: Normal First number: below 120. Second number: below 80. Elevated First number: 120-129. Second number: below 80. Hypertension stage 1 First number: 130-139. Second number: 80-89. Hypertension stage 2 First number: 140 or above. Second number: 90 or above. Your blood pressure is above normal even if only the first or  only the second number is above normal. Follow these instructions at home: Medicines Take over-the-counter and prescription medicines only as told by your doctor. Tell your doctor if your medicine is causing side effects. General instructions Check your blood pressure as often as your doctor tells you to. Check your blood pressure at the same time every day. Take your monitor to your next doctor's appointment. Your doctor will: Make sure you are using it correctly. Make sure it is working right. Understand what your blood pressure numbers should be. Keep all follow-up visits. General tips You will need a blood pressure machine or monitor. Your doctor can suggest a monitor. You can buy one at a drugstore or online. When choosing one: Choose one with an arm cuff. Choose one that wraps around your upper arm. Only one finger should fit between your arm and the cuff. Do not choose one that measures your blood pressure from your wrist or finger. Where to find more information American Heart Association: www.heart.org Contact a doctor if: Your blood pressure keeps being high. Your blood pressure is suddenly low. Get help right away if: Your first blood pressure number is higher than 180. Your second blood pressure number is higher than 120. These symptoms may be an emergency. Do not wait to see if the symptoms will go away. Get help right away. Call 911. Summary Check your blood pressure at the same time every day. Avoid caffeine, alcohol, smoking, and exercise for 30 minutes before checking your blood pressure. Make sure you understand what your blood pressure numbers should be. This information is not intended to replace advice given to you by your health care provider. Make sure you discuss any questions you have with your health care provider. Document Revised: 04/26/2021 Document Reviewed: 04/26/2021 Elsevier Patient Education  2024 [employer].

## 2024-07-05 NOTE — Telephone Encounter (Signed)
 Called [employer] to check on approval status. Spoke with Arland MATSU.  Reference number [id number]  Pt. Is approved for 3 one dose injections of Orthovisc gel.  Approval is good from [id number] Approval number [id number]  I am waiting on approval letter that includes all benefit details, will place in providers box when received.

## 2024-07-06 NOTE — Telephone Encounter (Signed)
 Benefit approval letter received and placed in providers box for review

## 2024-07-07 NOTE — Telephone Encounter (Signed)
 This is the paperwork I gave to you, 800 dollar deductible, and covered at 100% after deductible.

## 2024-07-07 NOTE — Telephone Encounter (Signed)
 Spoke with Amy Leonard and advised her benefits for the Orthovisc shows she would have to meet her deductible of $800 then her insurance would cover 100% but she has only met $256.25.  Patient states she will have to wait on this until maybe next year.  FYI to Dr. Watt.

## 2024-07-30 ENCOUNTER — Other Ambulatory Visit: Payer: Self-pay

## 2024-07-30 NOTE — Patient Instructions (Signed)
 Visit Information  Thank you for taking time to visit with me today. Please don't hesitate to contact me if I can be of assistance to you before our next scheduled appointment.  Your next care management appointment is by telephone on 08/30/2024 at 9:00 am  Telephone follow-up in 1 month  Please call the care guide team at [phone] if you need to cancel, schedule, or reschedule an appointment.   SMOKING CESSATION SUPPORT/ASSIST: 1-800-QUIT-NOW  Please call the Suicide and Crisis Lifeline: 988 call the [place]  National Suicide Prevention Lifeline: (205)745-4768 or TTY: 857-165-7394 TTY 6136403996) to talk to a trained counselor call 1-800-273-TALK (toll free, 24 hour hotline) go to [facility] [address], [place] [phone]) call 911 if you are experiencing a Mental Health or Behavioral Health Crisis or need someone to talk to.  Nestora Duos, MSN, RN [place]  [facility], [facility] RN Care Manager Direct Dial: [phone] Fax: [phone]   Managing the Challenge of Quitting Smoking Quitting smoking is a physical and mental challenge. You may have cravings, withdrawal symptoms, and temptation to smoke. Before quitting, work with your health care provider to make a plan that can help you manage quitting. Making a plan before you quit may keep you from smoking when you have the urge to smoke while trying to quit. How to manage lifestyle changes Managing stress Stress can make you want to smoke, and wanting to smoke may cause stress. It is important to find ways to manage your stress. You could try some of the following: Practice relaxation techniques. Breathe slowly and deeply, in through your nose and out through your mouth. Listen to music. Soak in a bath or take a shower. Imagine a peaceful place or vacation. Get some support. Talk with family or friends about your stress. Join a support  group. Talk with a counselor or therapist. Get some physical activity. Go for a walk, run, or bike ride. Play a favorite sport. Practice yoga.  Medicines Talk with your health care provider about medicines that might help you deal with cravings and make quitting easier for you. Relationships Social situations can be difficult when you are quitting smoking. To manage this, you can: Avoid parties and other social situations where people might be smoking. Avoid alcohol. Leave right away if you have the urge to smoke. Explain to your family and friends that you are quitting smoking. Ask for support and let them know you might be a bit grumpy. Plan activities where smoking is not an option. General instructions Be aware that many people gain weight after they quit smoking. However, not everyone does. To keep from gaining weight, have a plan in place before you quit, and stick to the plan after you quit. Your plan should include: Eating healthy snacks. When you have a craving, it may help to: Eat popcorn, or try carrots, celery, or other cut vegetables. Chew sugar-free gum. Changing how you eat. Eat small portion sizes at meals. Eat 4-6 small meals throughout the day instead of 1-2 large meals a day. Be mindful when you eat. You should avoid watching television or doing other things that might distract you as you eat. Exercising regularly. Make time to exercise each day. If you do not have time for a long workout, do short bouts of exercise for 5-10 minutes several times a day. Do some form of strengthening exercise, such as weight lifting. Do some exercise that gets your heart beating and causes you to  breathe deeply, such as walking fast, running, swimming, or biking. This is very important. Drinking plenty of water or other low-calorie or no-calorie drinks. Drink enough fluid to keep your urine pale yellow.  How to recognize withdrawal symptoms Your body and mind may experience  discomfort as you try to get used to not having nicotine  in your system. These effects are called withdrawal symptoms. They may include: Feeling hungrier than normal. Having trouble concentrating. Feeling irritable or restless. Having trouble sleeping. Feeling depressed. Craving a cigarette. These symptoms may surprise you, but they are normal to have when quitting smoking. To manage withdrawal symptoms: Avoid places, people, and activities that trigger your cravings. Remember why you want to quit. Get plenty of sleep. Avoid coffee and other drinks that contain caffeine. These may worsen some of your symptoms. How to manage cravings Come up with a plan for how to deal with your cravings. The plan should include the following: A definition of the specific situation you want to deal with. An activity or action you will take to replace smoking. A clear idea for how this action will help. The name of someone who could help you with this. Cravings usually last for 5-10 minutes. Consider taking the following actions to help you with your plan to deal with cravings: Keep your mouth busy. Chew sugar-free gum. Suck on hard candies or a straw. Brush your teeth. Keep your hands and body busy. Change to a different activity right away. Squeeze or play with a ball. Do an activity or a hobby, such as making bead jewelry, practicing needlepoint, or working with wood. Mix up your normal routine. Take a short exercise break. Go for a quick walk, or run up and down stairs. Focus on doing something kind or helpful for someone else. Call a friend or family member to talk during a craving. Join a support group. Contact a quitline. Where to find support To get help or find a support group: Call the National Cancer Institute's Smoking Quitline: 1-800-QUIT-NOW 479-848-9226) Text QUIT to SmokefreeTXT: 521151 Where to find more information Visit these websites to find more information on quitting  smoking: U.S. Department of Health and Human Services: www.smokefree.gov American Lung Association: www.freedomfromsmoking.org Centers for Disease Control and Prevention (CDC): footballexhibition.com.br American Heart Association: www.heart.org Contact a health care provider if: You want to change your plan for quitting. The medicines you are taking are not helping. Your eating feels out of control or you cannot sleep. You feel depressed or become very anxious. Summary Quitting smoking is a physical and mental challenge. You will face cravings, withdrawal symptoms, and temptation to smoke again. Preparation can help you as you go through these challenges. Try different techniques to manage stress, handle social situations, and prevent weight gain. You can deal with cravings by keeping your mouth busy (such as by chewing gum), keeping your hands and body busy, calling family or friends, or contacting a quitline for people who want to quit smoking. You can deal with withdrawal symptoms by avoiding places where people smoke, getting plenty of rest, and avoiding drinks that contain caffeine. This information is not intended to replace advice given to you by your health care provider. Make sure you discuss any questions you have with your health care provider. Document Revised: 08/03/2021 Document Reviewed: 08/03/2021 Elsevier Patient Education  2024 [employer].

## 2024-07-30 NOTE — Patient Outreach (Signed)
 Complex Care Management   Visit Note  07/30/2024  Name:  Amy Leonard MRN: [id number] DOB: November 11, 1960  Situation: Referral received for Complex Care Management related to HTN and Smoking Cessation I obtained verbal consent from Patient.  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   History of chemotherapy 2014   BREAST CA   HLD (hyperlipidemia)    HSV infection    recurrent (side and buttox)   Hypertension    Migraine    Personal history of chemotherapy 2014   bilateral breast ca   Personal history of radiation therapy 2014   bilateral breast ca   Radiation 2014   BREAST CA   Tobacco abuse    Wears dentures    top    Assessment: Patient Reported Symptoms:  Cognitive Cognitive Status: No symptoms reported Cognitive/Intellectual Conditions Management [RPT]: None reported or documented in medical history or problem list   Health Maintenance Behaviors: Annual physical exam, Immunizations  Neurological Neurological Review of Symptoms: Headaches Neurological Comment: migraine yesterday - better now - reports difficulty focusing and slow moving whn migraine, sometimes nausea but none yesterday  HEENT HEENT Symptoms Reported: Nasal discharge HEENT Management Strategies: Routine screening HEENT Comment: allergies    Cardiovascular Cardiovascular Symptoms Reported: Dizziness Does patient have uncontrolled Hypertension?: No Cardiovascular Management Strategies: Routine screening Cardiovascular Comment: checked BP - positive reinforcement  provided for following care plan 116/67, reports occasional dizziness of turns too fast or bendng and getting up too fast, discussed fall precautions  Respiratory Respiratory Symptoms Reported: Dry cough Other Respiratory Symptoms: reports some coughing after being outside in cold - none today, smoking about a pack a day, discussed cutting back instead of quitting cold, will try reducing by 3 cigarettes a day for a week then 6 then 9 etc - thinks this might work - positive encouragement for change and discussed benefits for health and Amy Leonard's health and financial benefits of quitting Respiratory Management Strategies: Routine screening  Endocrine Endocrine Symptoms Reported: Not assessed Is patient diabetic?: No    Gastrointestinal Gastrointestinal Symptoms Reported: No symptoms reported      Genitourinary Genitourinary Symptoms Reported: Incontinence Genitourinary Management Strategies: Incontinence garment/pad  Integumentary Integumentary Symptoms Reported: No symptoms reported    Musculoskeletal Musculoskelatal Symptoms Reviewed: Back pain, Joint pain, Muscle pain Additional Musculoskeletal Details: states no new falls, knee pain improved after injection, cannot afford orthovisc, some bck discmfort but overall improved, uses tylenol  and meloxicam  prn Musculoskeletal Management Strategies: Medication therapy, Routine screening Falls in the past year?: Yes Number of falls in past year: 1 or less Was there an injury with Fall?: No Fall Risk Category Calculator: 1 Patient Fall Risk Level: Low Fall Risk Patient at Risk for Falls Due to: History of fall(s), Orthopedic patient Fall risk Follow up: Falls evaluation completed, Follow up appointment  Psychosocial Psychosocial Symptoms Reported: No symptoms reported Behavioral Management Strategies: Adequate rest Behavioral Health Comment: focus on granddaughter helps her Techniques  to Cope with Loss/Stress/Change: Diversional activities       07/30/2024    PHQ2-9 Depression Screening   Little interest or pleasure in doing things Not at all  Feeling down, depressed, or hopeless Not at all  PHQ-2 - Total Score 0  Trouble falling or staying asleep, or sleeping too much    Feeling tired or having little energy    Poor appetite or overeating     Feeling bad about yourself - or that you are a failure or have let yourself or your family down    Trouble concentrating on things, such as reading the newspaper or watching television    Moving or speaking so slowly that other people could have noticed.  Or the opposite - being so fidgety or restless that you have been moving around a lot more than usual    Thoughts that you would be better off dead, or hurting yourself in some way    PHQ2-9 Total Score    If you checked off any problems, how difficult have these problems made it for you to do your work, take care of things at home, or get along with other people    Depression Interventions/Treatment      Today's Vitals   07/30/24 1101  BP: 116/67      Medications Reviewed Today   Medications were not reviewed in this encounter     Recommendation:   PCP Follow-up Continue Current Plan of Care  Follow Up Plan:   Telephone follow-up in 1 month  Nestora Duos, MSN, RN [facility]  [facility], [facility] RN Care Manager Direct Dial: [phone] Fax: [phone]

## 2024-08-30 ENCOUNTER — Other Ambulatory Visit: Payer: Self-pay

## 2024-08-30 NOTE — Patient Instructions (Signed)
 Visit Information  Thank you for taking time to visit with me today. Please don't hesitate to contact me if I can be of assistance to you before our next scheduled appointment.  Your next care management appointment is by telephone on 10/04/2024 at 9:00 am  Telephone follow-up in 1 month  Please call the care guide team at [phone] if you need to cancel, schedule, or reschedule an appointment.   Please call the Suicide and Crisis Lifeline: 988 call the [place]  National Suicide Prevention Lifeline: 804-040-2915 or TTY: 9172303228 TTY 727-777-3958) to talk to a trained counselor call 1-800-273-TALK (toll free, 24 hour hotline) go to [facility] [address] [place] [phone]) call 911 if you are experiencing a Mental Health or Behavioral Health Crisis or need someone to talk to.  Nestora Duos, MSN, RN [facility], [facility] RN Care Manager Direct Dial: [phone] Fax: [phone]

## 2024-08-30 NOTE — Patient Outreach (Signed)
 Complex Care Management   Visit Note  08/30/2024  Name:  Amy Leonard MRN: [id number] DOB: 21-Feb-1961  Situation: Referral received for Complex Care Management related to HTN Smoking Cessation I obtained verbal consent from Patient.  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   History of chemotherapy 2014   BREAST CA   HLD (hyperlipidemia)    HSV infection    recurrent (side and buttox)   Hypertension    Migraine    Personal history of chemotherapy 2014   bilateral breast ca   Personal history of radiation therapy 2014   bilateral breast ca   Radiation 2014   BREAST CA   Tobacco abuse    Wears dentures    top    Assessment: Patient Reported Symptoms:  Cognitive Cognitive Status: No symptoms reported Cognitive/Intellectual Conditions Management [RPT]: None reported or documented in medical history or problem list   Health Maintenance Behaviors: Annual physical exam  Neurological Neurological Review of Symptoms: Headaches Neurological Comment: reports improved - only every cople Lashonda Sonneborn  HEENT HEENT Symptoms Reported: No symptoms reported HEENT Management Strategies: Routine screening    Cardiovascular Cardiovascular Symptoms Reported: Dizziness Cardiovascular Comment: less dizziness with bending or turning, stopped checking BP daily over holidays agreed to resume, still smoking 1 ppd, states will try patches, aware to remove if smoking  Respiratory Respiratory Symptoms Reported: No symptoms reported Respiratory Management  Strategies: Routine screening  Endocrine Endocrine Symptoms Reported: Not assessed Is patient diabetic?: No    Gastrointestinal Gastrointestinal Symptoms Reported: No symptoms reported Gastrointestinal Management Strategies: Medication therapy    Genitourinary Genitourinary Symptoms Reported: Incontinence    Integumentary Additional Integumentary Details: callous on foot - dry applying moisturizer    Musculoskeletal Additional Musculoskeletal Details: no falls, knee pain doing better since injection, feet some discomfort from standing at work and home over holidays Musculoskeletal Management Strategies: Medication therapy Falls in the past year?: Yes Number of falls in past year: 1 or less Was there an injury with Fall?: No Fall Risk Category Calculator: 1 Patient Fall Risk Level: Low Fall Risk Patient at Risk for Falls Due to: History of fall(s), Orthopedic patient Fall risk Follow up: Falls evaluation completed, Falls prevention discussed  Psychosocial Psychosocial Symptoms Reported: No symptoms reported          08/30/2024    PHQ2-9 Depression Screening   Little interest or pleasure in doing things Several days  Feeling down, depressed, or hopeless Not at all  PHQ-2 - Total Score 1  Trouble falling or staying asleep, or sleeping too much    Feeling tired or having little energy    Poor appetite or overeating     Feeling bad about yourself - or that you are a failure or have let yourself or your family down    Trouble concentrating on things, such as reading the newspaper or watching television    Moving or speaking so slowly that other people  could have noticed.  Or the opposite - being so fidgety or restless that you have been moving around a lot more than usual    Thoughts that you would be better off dead, or hurting yourself in some way    PHQ2-9 Total Score    If you checked off any problems, how difficult have these problems made it for you to do your work, take care of  things at home, or get along with other people    Depression Interventions/Treatment      There were no vitals filed for this visit.    Medications Reviewed Today   Medications were not reviewed in this encounter     Recommendation:   PCP Follow-up Continue Current Plan of Care  Follow Up Plan:   Telephone follow-up in 1 month  Nestora Duos, MSN, RN [facility]  [facility], [facility] RN Care Manager Direct Dial: [phone] Fax: [phone]

## 2024-10-04 ENCOUNTER — Telehealth
